# Patient Record
Sex: Female | Born: 1945 | Race: White | Hispanic: No | Marital: Married | Smoking: Never smoker
Health system: Southern US, Community
[De-identification: ages and names within clinical notes are randomized; demographics above are authoritative.]

## PROBLEM LIST (undated history)

## (undated) DIAGNOSIS — I10 Essential (primary) hypertension: Secondary | ICD-10-CM

## (undated) DIAGNOSIS — M797 Fibromyalgia: Secondary | ICD-10-CM

## (undated) DIAGNOSIS — K8689 Other specified diseases of pancreas: Principal | ICD-10-CM

## (undated) DIAGNOSIS — J984 Other disorders of lung: Secondary | ICD-10-CM

## (undated) DIAGNOSIS — N179 Acute kidney failure, unspecified: Secondary | ICD-10-CM

## (undated) DIAGNOSIS — N289 Disorder of kidney and ureter, unspecified: Secondary | ICD-10-CM

## (undated) DIAGNOSIS — E7800 Pure hypercholesterolemia, unspecified: Secondary | ICD-10-CM

## (undated) DIAGNOSIS — J189 Pneumonia, unspecified organism: Secondary | ICD-10-CM

## (undated) DIAGNOSIS — I5032 Chronic diastolic (congestive) heart failure: Secondary | ICD-10-CM

## (undated) DIAGNOSIS — I209 Angina pectoris, unspecified: Secondary | ICD-10-CM

## (undated) DIAGNOSIS — K219 Gastro-esophageal reflux disease without esophagitis: Secondary | ICD-10-CM

## (undated) DIAGNOSIS — I272 Pulmonary hypertension, unspecified: Secondary | ICD-10-CM

## (undated) DIAGNOSIS — I779 Disorder of arteries and arterioles, unspecified: Secondary | ICD-10-CM

## (undated) DIAGNOSIS — I739 Peripheral vascular disease, unspecified: Secondary | ICD-10-CM

## (undated) DIAGNOSIS — J811 Chronic pulmonary edema: Secondary | ICD-10-CM

## (undated) DIAGNOSIS — I2510 Atherosclerotic heart disease of native coronary artery without angina pectoris: Secondary | ICD-10-CM

## (undated) DIAGNOSIS — E039 Hypothyroidism, unspecified: Secondary | ICD-10-CM

## (undated) DIAGNOSIS — R112 Nausea with vomiting, unspecified: Secondary | ICD-10-CM

## (undated) DIAGNOSIS — R011 Cardiac murmur, unspecified: Secondary | ICD-10-CM

## (undated) DIAGNOSIS — Z9981 Dependence on supplemental oxygen: Secondary | ICD-10-CM

## (undated) DIAGNOSIS — F419 Anxiety disorder, unspecified: Secondary | ICD-10-CM

## (undated) DIAGNOSIS — J15212 Pneumonia due to Methicillin resistant Staphylococcus aureus: Secondary | ICD-10-CM

## (undated) DIAGNOSIS — Z8719 Personal history of other diseases of the digestive system: Secondary | ICD-10-CM

## (undated) DIAGNOSIS — D649 Anemia, unspecified: Secondary | ICD-10-CM

## (undated) DIAGNOSIS — Z9889 Other specified postprocedural states: Secondary | ICD-10-CM

## (undated) DIAGNOSIS — I509 Heart failure, unspecified: Secondary | ICD-10-CM

## (undated) DIAGNOSIS — E119 Type 2 diabetes mellitus without complications: Secondary | ICD-10-CM

## (undated) HISTORY — DX: Pulmonary hypertension, unspecified: I27.20

## (undated) HISTORY — DX: Atherosclerotic heart disease of native coronary artery without angina pectoris: I25.10

## (undated) HISTORY — DX: Pneumonia due to methicillin resistant Staphylococcus aureus: J15.212

## (undated) HISTORY — DX: Chronic pulmonary edema: J81.1

## (undated) HISTORY — DX: Essential (primary) hypertension: I10

## (undated) HISTORY — DX: Fibromyalgia: M79.7

## (undated) HISTORY — DX: Chronic diastolic (congestive) heart failure: I50.32

## (undated) HISTORY — PX: TUBAL LIGATION: SHX77

## (undated) HISTORY — DX: Anemia, unspecified: D64.9

## (undated) HISTORY — DX: Other disorders of lung: J98.4

## (undated) HISTORY — DX: Disorder of kidney and ureter, unspecified: N28.9

## (undated) HISTORY — DX: Peripheral vascular disease, unspecified: I73.9

## (undated) HISTORY — DX: Acute kidney failure, unspecified: N17.9

## (undated) HISTORY — DX: Other specified diseases of pancreas: K86.89

## (undated) HISTORY — PX: CORONARY ANGIOPLASTY WITH STENT PLACEMENT: SHX49

## (undated) HISTORY — DX: Disorder of arteries and arterioles, unspecified: I77.9

## (undated) HISTORY — PX: DILATION AND CURETTAGE OF UTERUS: SHX78

## (undated) HISTORY — PX: ABDOMINAL HYSTERECTOMY: SHX81

## (undated) HISTORY — DX: Pure hypercholesterolemia, unspecified: E78.00

---

## 1991-10-23 HISTORY — PX: CORONARY ANGIOPLASTY: SHX604

## 1995-10-23 HISTORY — PX: CORONARY ARTERY BYPASS GRAFT: SHX141

## 1999-02-09 ENCOUNTER — Inpatient Hospital Stay (HOSPITAL_COMMUNITY): Admission: EM | Admit: 1999-02-09 | Discharge: 1999-02-14 | Payer: Self-pay | Admitting: Emergency Medicine

## 1999-02-09 ENCOUNTER — Encounter: Payer: Self-pay | Admitting: Internal Medicine

## 1999-02-11 ENCOUNTER — Encounter: Payer: Self-pay | Admitting: Cardiology

## 1999-02-14 ENCOUNTER — Encounter: Payer: Self-pay | Admitting: Cardiology

## 2002-02-16 ENCOUNTER — Encounter: Payer: Self-pay | Admitting: Family Medicine

## 2002-02-16 ENCOUNTER — Inpatient Hospital Stay (HOSPITAL_COMMUNITY): Admission: AD | Admit: 2002-02-16 | Discharge: 2002-02-18 | Payer: Self-pay | Admitting: Family Medicine

## 2002-02-16 ENCOUNTER — Ambulatory Visit (HOSPITAL_COMMUNITY): Admission: RE | Admit: 2002-02-16 | Discharge: 2002-02-16 | Payer: Self-pay | Admitting: Family Medicine

## 2002-02-18 ENCOUNTER — Encounter: Payer: Self-pay | Admitting: Family Medicine

## 2002-03-02 ENCOUNTER — Encounter: Payer: Self-pay | Admitting: Family Medicine

## 2002-03-02 ENCOUNTER — Ambulatory Visit (HOSPITAL_COMMUNITY): Admission: RE | Admit: 2002-03-02 | Discharge: 2002-03-02 | Payer: Self-pay | Admitting: Family Medicine

## 2002-03-03 ENCOUNTER — Ambulatory Visit (HOSPITAL_COMMUNITY): Admission: RE | Admit: 2002-03-03 | Discharge: 2002-03-03 | Payer: Self-pay | Admitting: Pulmonary Disease

## 2002-03-13 ENCOUNTER — Encounter: Payer: Self-pay | Admitting: Family Medicine

## 2002-03-13 ENCOUNTER — Ambulatory Visit (HOSPITAL_COMMUNITY): Admission: RE | Admit: 2002-03-13 | Discharge: 2002-03-13 | Payer: Self-pay | Admitting: Family Medicine

## 2002-04-10 ENCOUNTER — Encounter: Payer: Self-pay | Admitting: Critical Care Medicine

## 2002-04-10 ENCOUNTER — Ambulatory Visit (HOSPITAL_COMMUNITY): Admission: RE | Admit: 2002-04-10 | Discharge: 2002-04-10 | Payer: Self-pay | Admitting: Critical Care Medicine

## 2002-05-06 ENCOUNTER — Ambulatory Visit (HOSPITAL_COMMUNITY): Admission: RE | Admit: 2002-05-06 | Discharge: 2002-05-06 | Payer: Self-pay | Admitting: Gastroenterology

## 2002-06-19 ENCOUNTER — Encounter: Admission: RE | Admit: 2002-06-19 | Discharge: 2002-06-19 | Payer: Self-pay | Admitting: Internal Medicine

## 2002-06-19 ENCOUNTER — Encounter: Payer: Self-pay | Admitting: Internal Medicine

## 2002-07-03 ENCOUNTER — Encounter: Payer: Self-pay | Admitting: Internal Medicine

## 2002-07-03 ENCOUNTER — Encounter: Admission: RE | Admit: 2002-07-03 | Discharge: 2002-07-03 | Payer: Self-pay | Admitting: Internal Medicine

## 2003-03-11 ENCOUNTER — Ambulatory Visit (HOSPITAL_COMMUNITY): Admission: RE | Admit: 2003-03-11 | Discharge: 2003-03-11 | Payer: Self-pay | Admitting: Internal Medicine

## 2003-03-11 ENCOUNTER — Encounter: Payer: Self-pay | Admitting: Internal Medicine

## 2003-04-12 ENCOUNTER — Observation Stay (HOSPITAL_COMMUNITY): Admission: EM | Admit: 2003-04-12 | Discharge: 2003-04-13 | Payer: Self-pay | Admitting: Emergency Medicine

## 2003-04-12 ENCOUNTER — Encounter: Payer: Self-pay | Admitting: Emergency Medicine

## 2004-01-13 ENCOUNTER — Ambulatory Visit (HOSPITAL_COMMUNITY): Admission: RE | Admit: 2004-01-13 | Discharge: 2004-01-13 | Payer: Self-pay | Admitting: Internal Medicine

## 2005-01-29 ENCOUNTER — Ambulatory Visit (HOSPITAL_COMMUNITY): Admission: RE | Admit: 2005-01-29 | Discharge: 2005-01-29 | Payer: Self-pay | Admitting: Internal Medicine

## 2005-04-19 ENCOUNTER — Ambulatory Visit: Payer: Self-pay | Admitting: Cardiology

## 2005-04-26 ENCOUNTER — Ambulatory Visit: Payer: Self-pay | Admitting: Cardiology

## 2005-05-11 ENCOUNTER — Ambulatory Visit: Payer: Self-pay | Admitting: Cardiology

## 2005-09-25 ENCOUNTER — Ambulatory Visit: Payer: Self-pay | Admitting: Cardiology

## 2005-09-26 ENCOUNTER — Ambulatory Visit: Payer: Self-pay | Admitting: Cardiology

## 2005-09-28 ENCOUNTER — Inpatient Hospital Stay (HOSPITAL_BASED_OUTPATIENT_CLINIC_OR_DEPARTMENT_OTHER): Admission: RE | Admit: 2005-09-28 | Discharge: 2005-09-28 | Payer: Self-pay | Admitting: Cardiology

## 2005-09-28 ENCOUNTER — Ambulatory Visit: Payer: Self-pay | Admitting: Cardiology

## 2005-10-05 ENCOUNTER — Ambulatory Visit (HOSPITAL_COMMUNITY): Admission: RE | Admit: 2005-10-05 | Discharge: 2005-10-06 | Payer: Self-pay | Admitting: Cardiology

## 2005-10-31 ENCOUNTER — Ambulatory Visit: Payer: Self-pay | Admitting: *Deleted

## 2006-02-25 ENCOUNTER — Ambulatory Visit: Payer: Self-pay | Admitting: Cardiology

## 2006-03-12 ENCOUNTER — Ambulatory Visit: Payer: Self-pay

## 2006-03-19 ENCOUNTER — Ambulatory Visit: Payer: Self-pay | Admitting: Cardiology

## 2006-03-25 ENCOUNTER — Ambulatory Visit: Payer: Self-pay

## 2006-04-15 ENCOUNTER — Ambulatory Visit: Payer: Self-pay

## 2006-04-23 ENCOUNTER — Emergency Department (HOSPITAL_COMMUNITY): Admission: EM | Admit: 2006-04-23 | Discharge: 2006-04-23 | Payer: Self-pay | Admitting: Emergency Medicine

## 2006-05-02 ENCOUNTER — Ambulatory Visit: Payer: Self-pay | Admitting: Cardiology

## 2006-05-27 ENCOUNTER — Ambulatory Visit: Payer: Self-pay

## 2006-07-12 ENCOUNTER — Ambulatory Visit: Payer: Self-pay | Admitting: Cardiology

## 2006-10-24 ENCOUNTER — Ambulatory Visit: Payer: Self-pay | Admitting: Cardiology

## 2006-10-25 ENCOUNTER — Ambulatory Visit: Payer: Self-pay | Admitting: Cardiology

## 2006-10-29 ENCOUNTER — Ambulatory Visit (HOSPITAL_COMMUNITY): Admission: RE | Admit: 2006-10-29 | Discharge: 2006-10-29 | Payer: Self-pay | Admitting: Cardiology

## 2007-01-09 ENCOUNTER — Inpatient Hospital Stay (HOSPITAL_COMMUNITY): Admission: AD | Admit: 2007-01-09 | Discharge: 2007-01-15 | Payer: Self-pay | Admitting: Internal Medicine

## 2007-03-10 ENCOUNTER — Ambulatory Visit (HOSPITAL_COMMUNITY): Admission: RE | Admit: 2007-03-10 | Discharge: 2007-03-10 | Payer: Self-pay | Admitting: Family Medicine

## 2007-03-25 ENCOUNTER — Ambulatory Visit: Payer: Self-pay | Admitting: Cardiology

## 2007-04-22 ENCOUNTER — Ambulatory Visit (HOSPITAL_COMMUNITY): Admission: RE | Admit: 2007-04-22 | Discharge: 2007-04-22 | Payer: Self-pay | Admitting: Internal Medicine

## 2007-09-04 ENCOUNTER — Ambulatory Visit (HOSPITAL_COMMUNITY): Admission: RE | Admit: 2007-09-04 | Discharge: 2007-09-04 | Payer: Self-pay | Admitting: Family Medicine

## 2007-09-30 ENCOUNTER — Ambulatory Visit: Payer: Self-pay | Admitting: Cardiology

## 2007-10-13 ENCOUNTER — Encounter: Payer: Self-pay | Admitting: Cardiology

## 2007-10-13 ENCOUNTER — Ambulatory Visit: Payer: Self-pay | Admitting: Cardiology

## 2007-10-13 ENCOUNTER — Ambulatory Visit: Payer: Self-pay

## 2007-10-13 LAB — CONVERTED CEMR LAB
Basophils Absolute: 0 10*3/uL (ref 0.0–0.1)
Basophils Relative: 0.1 % (ref 0.0–1.0)
CO2: 32 meq/L (ref 19–32)
Chloride: 99 meq/L (ref 96–112)
Creatinine, Ser: 0.8 mg/dL (ref 0.4–1.2)
HCT: 34 % — ABNORMAL LOW (ref 36.0–46.0)
Hemoglobin: 11.8 g/dL — ABNORMAL LOW (ref 12.0–15.0)
Monocytes Absolute: 0.8 10*3/uL — ABNORMAL HIGH (ref 0.2–0.7)
Neutrophils Relative %: 73.9 % (ref 43.0–77.0)
Potassium: 4.2 meq/L (ref 3.5–5.1)
Pro B Natriuretic peptide (BNP): 429 pg/mL — ABNORMAL HIGH (ref 0.0–100.0)
RBC: 3.75 M/uL — ABNORMAL LOW (ref 3.87–5.11)
RDW: 12.7 % (ref 11.5–14.6)
Sodium: 139 meq/L (ref 135–145)
TSH: 2.07 microintl units/mL (ref 0.35–5.50)
WBC: 7.8 10*3/uL (ref 4.5–10.5)

## 2008-06-03 ENCOUNTER — Ambulatory Visit (HOSPITAL_COMMUNITY): Admission: RE | Admit: 2008-06-03 | Discharge: 2008-06-03 | Payer: Self-pay | Admitting: Family Medicine

## 2008-07-23 ENCOUNTER — Ambulatory Visit: Payer: Self-pay | Admitting: Cardiology

## 2008-07-23 LAB — CONVERTED CEMR LAB
BUN: 48 mg/dL — ABNORMAL HIGH (ref 6–23)
Basophils Relative: 0.2 % (ref 0.0–3.0)
CO2: 30 meq/L (ref 19–32)
Calcium: 8.8 mg/dL (ref 8.4–10.5)
Creatinine, Ser: 1.2 mg/dL (ref 0.4–1.2)
GFR calc Af Amer: 58 mL/min
Glucose, Bld: 92 mg/dL (ref 70–99)
HCT: 37.7 % (ref 36.0–46.0)
Hemoglobin: 12.9 g/dL (ref 12.0–15.0)
Lymphocytes Relative: 16.4 % (ref 12.0–46.0)
MCHC: 34.4 g/dL (ref 30.0–36.0)
Monocytes Absolute: 0.9 10*3/uL (ref 0.1–1.0)
Monocytes Relative: 9.9 % (ref 3.0–12.0)
Neutro Abs: 6.4 10*3/uL (ref 1.4–7.7)
RBC: 4.08 M/uL (ref 3.87–5.11)
RDW: 13.1 % (ref 11.5–14.6)

## 2008-08-02 ENCOUNTER — Encounter: Payer: Self-pay | Admitting: Cardiology

## 2008-08-02 ENCOUNTER — Ambulatory Visit: Payer: Self-pay

## 2008-09-04 ENCOUNTER — Encounter
Admission: RE | Admit: 2008-09-04 | Discharge: 2008-09-04 | Payer: Self-pay | Admitting: Physical Medicine and Rehabilitation

## 2008-09-27 ENCOUNTER — Emergency Department (HOSPITAL_COMMUNITY): Admission: EM | Admit: 2008-09-27 | Discharge: 2008-09-27 | Payer: Self-pay | Admitting: Emergency Medicine

## 2008-11-19 ENCOUNTER — Emergency Department (HOSPITAL_COMMUNITY): Admission: EM | Admit: 2008-11-19 | Discharge: 2008-11-19 | Payer: Self-pay | Admitting: Emergency Medicine

## 2008-12-13 ENCOUNTER — Ambulatory Visit (HOSPITAL_COMMUNITY): Admission: RE | Admit: 2008-12-13 | Discharge: 2008-12-13 | Payer: Self-pay | Admitting: Family Medicine

## 2009-02-10 ENCOUNTER — Encounter: Payer: Self-pay | Admitting: Cardiology

## 2009-03-13 ENCOUNTER — Encounter: Payer: Self-pay | Admitting: Cardiology

## 2009-06-29 ENCOUNTER — Emergency Department (HOSPITAL_COMMUNITY): Admission: EM | Admit: 2009-06-29 | Discharge: 2009-06-29 | Payer: Self-pay | Admitting: Emergency Medicine

## 2009-06-29 ENCOUNTER — Telehealth: Payer: Self-pay | Admitting: Cardiology

## 2009-07-18 DIAGNOSIS — I5032 Chronic diastolic (congestive) heart failure: Secondary | ICD-10-CM

## 2009-07-18 DIAGNOSIS — E785 Hyperlipidemia, unspecified: Secondary | ICD-10-CM

## 2009-07-19 ENCOUNTER — Ambulatory Visit: Payer: Self-pay | Admitting: Cardiology

## 2009-07-19 ENCOUNTER — Encounter (INDEPENDENT_AMBULATORY_CARE_PROVIDER_SITE_OTHER): Payer: Self-pay | Admitting: *Deleted

## 2009-07-21 ENCOUNTER — Inpatient Hospital Stay (HOSPITAL_BASED_OUTPATIENT_CLINIC_OR_DEPARTMENT_OTHER): Admission: RE | Admit: 2009-07-21 | Discharge: 2009-07-21 | Payer: Self-pay | Admitting: Cardiology

## 2009-07-21 ENCOUNTER — Ambulatory Visit: Payer: Self-pay | Admitting: Cardiology

## 2009-07-25 ENCOUNTER — Ambulatory Visit: Payer: Self-pay | Admitting: Cardiology

## 2009-07-25 ENCOUNTER — Encounter: Payer: Self-pay | Admitting: Cardiology

## 2009-07-25 ENCOUNTER — Ambulatory Visit: Payer: Self-pay | Admitting: Cardiovascular Disease

## 2009-07-25 ENCOUNTER — Ambulatory Visit: Payer: Self-pay | Admitting: Internal Medicine

## 2009-07-25 ENCOUNTER — Ambulatory Visit (HOSPITAL_COMMUNITY): Admission: RE | Admit: 2009-07-25 | Discharge: 2009-07-25 | Payer: Self-pay | Admitting: Cardiology

## 2009-07-25 ENCOUNTER — Ambulatory Visit: Payer: Self-pay

## 2009-07-25 DIAGNOSIS — I2721 Secondary pulmonary arterial hypertension: Secondary | ICD-10-CM | POA: Insufficient documentation

## 2009-07-25 DIAGNOSIS — N259 Disorder resulting from impaired renal tubular function, unspecified: Secondary | ICD-10-CM | POA: Insufficient documentation

## 2009-07-26 ENCOUNTER — Ambulatory Visit: Payer: Self-pay | Admitting: Cardiology

## 2009-07-26 LAB — CONVERTED CEMR LAB
CO2: 35 meq/L — ABNORMAL HIGH (ref 19–32)
Calcium: 9.4 mg/dL (ref 8.4–10.5)
Creatinine, Ser: 2 mg/dL — ABNORMAL HIGH (ref 0.4–1.2)
GFR calc non Af Amer: 26.61 mL/min (ref >60)

## 2009-07-29 ENCOUNTER — Ambulatory Visit: Payer: Self-pay | Admitting: Emergency Medicine

## 2009-07-29 ENCOUNTER — Ambulatory Visit: Payer: Self-pay | Admitting: Cardiology

## 2009-07-29 DIAGNOSIS — I5033 Acute on chronic diastolic (congestive) heart failure: Secondary | ICD-10-CM

## 2009-08-01 ENCOUNTER — Encounter: Payer: Self-pay | Admitting: Cardiology

## 2009-08-02 LAB — CONVERTED CEMR LAB
BUN: 82 mg/dL — ABNORMAL HIGH (ref 6–23)
Creatinine, Ser: 2.4 mg/dL — ABNORMAL HIGH (ref 0.4–1.2)
GFR calc non Af Amer: 21.56 mL/min (ref >60)
Potassium: 4.3 meq/L (ref 3.5–5.1)

## 2009-08-04 ENCOUNTER — Ambulatory Visit: Payer: Self-pay | Admitting: Cardiology

## 2009-08-07 LAB — CONVERTED CEMR LAB
CO2: 35 meq/L — ABNORMAL HIGH (ref 19–32)
Calcium: 8.8 mg/dL (ref 8.4–10.5)
GFR calc non Af Amer: 20.57 mL/min (ref >60)
Potassium: 3.4 meq/L — ABNORMAL LOW (ref 3.5–5.1)
Sodium: 129 meq/L — ABNORMAL LOW (ref 135–145)

## 2009-08-08 ENCOUNTER — Ambulatory Visit: Payer: Self-pay | Admitting: Cardiology

## 2009-08-11 LAB — CONVERTED CEMR LAB
Chloride: 95 meq/L — ABNORMAL LOW (ref 96–112)
Potassium: 3.6 meq/L (ref 3.5–5.1)
Pro B Natriuretic peptide (BNP): 244 pg/mL — ABNORMAL HIGH (ref 0.0–100.0)

## 2009-08-16 ENCOUNTER — Ambulatory Visit: Payer: Self-pay | Admitting: Cardiology

## 2009-08-17 ENCOUNTER — Encounter: Payer: Self-pay | Admitting: Cardiology

## 2009-08-22 ENCOUNTER — Telehealth: Payer: Self-pay | Admitting: Cardiology

## 2009-08-22 LAB — CONVERTED CEMR LAB
CO2: 27 meq/L (ref 19–32)
Chloride: 102 meq/L (ref 96–112)
Sodium: 140 meq/L (ref 135–145)

## 2009-08-23 ENCOUNTER — Ambulatory Visit: Payer: Self-pay | Admitting: Emergency Medicine

## 2009-09-01 ENCOUNTER — Telehealth: Payer: Self-pay | Admitting: Emergency Medicine

## 2009-09-06 ENCOUNTER — Encounter: Payer: Self-pay | Admitting: Emergency Medicine

## 2009-09-06 ENCOUNTER — Ambulatory Visit: Admission: RE | Admit: 2009-09-06 | Discharge: 2009-09-06 | Payer: Self-pay | Admitting: Emergency Medicine

## 2009-09-07 ENCOUNTER — Encounter: Payer: Self-pay | Admitting: Cardiology

## 2009-09-12 ENCOUNTER — Ambulatory Visit: Payer: Self-pay | Admitting: Pulmonary Disease

## 2009-09-13 ENCOUNTER — Ambulatory Visit: Payer: Self-pay | Admitting: Cardiology

## 2009-09-16 ENCOUNTER — Telehealth (INDEPENDENT_AMBULATORY_CARE_PROVIDER_SITE_OTHER): Payer: Self-pay | Admitting: *Deleted

## 2009-09-22 ENCOUNTER — Telehealth: Payer: Self-pay | Admitting: Cardiology

## 2009-09-22 LAB — CONVERTED CEMR LAB
BUN: 25 mg/dL — ABNORMAL HIGH (ref 6–23)
CO2: 32 meq/L (ref 19–32)
Chloride: 101 meq/L (ref 96–112)
Glucose, Bld: 143 mg/dL — ABNORMAL HIGH (ref 70–99)
Potassium: 4.3 meq/L (ref 3.5–5.1)
Pro B Natriuretic peptide (BNP): 101 pg/mL — ABNORMAL HIGH (ref 0.0–100.0)

## 2009-09-23 ENCOUNTER — Encounter: Payer: Self-pay | Admitting: Emergency Medicine

## 2009-09-26 ENCOUNTER — Telehealth: Payer: Self-pay | Admitting: Cardiology

## 2009-10-12 ENCOUNTER — Ambulatory Visit: Payer: Self-pay | Admitting: Emergency Medicine

## 2009-10-12 ENCOUNTER — Ambulatory Visit: Payer: Self-pay | Admitting: Cardiology

## 2009-10-18 ENCOUNTER — Telehealth: Payer: Self-pay | Admitting: Cardiology

## 2009-10-18 LAB — CONVERTED CEMR LAB
BUN: 27 mg/dL — ABNORMAL HIGH (ref 6–23)
CO2: 34 meq/L — ABNORMAL HIGH (ref 19–32)
Chloride: 100 meq/L (ref 96–112)
Creatinine, Ser: 1.3 mg/dL — ABNORMAL HIGH (ref 0.4–1.2)

## 2009-10-20 ENCOUNTER — Encounter: Payer: Self-pay | Admitting: Cardiology

## 2009-10-20 ENCOUNTER — Telehealth: Payer: Self-pay | Admitting: Cardiology

## 2009-10-24 ENCOUNTER — Encounter: Payer: Self-pay | Admitting: Cardiology

## 2009-10-24 ENCOUNTER — Telehealth: Payer: Self-pay | Admitting: Cardiology

## 2009-10-31 ENCOUNTER — Encounter: Payer: Self-pay | Admitting: Cardiology

## 2009-10-31 LAB — CONVERTED CEMR LAB
CO2: 28 meq/L (ref 19–32)
Calcium: 9 mg/dL (ref 8.4–10.5)
Glucose, Bld: 108 mg/dL — ABNORMAL HIGH (ref 70–99)
Sodium: 144 meq/L (ref 135–145)

## 2009-11-01 ENCOUNTER — Encounter: Payer: Self-pay | Admitting: Cardiology

## 2009-11-02 ENCOUNTER — Ambulatory Visit: Payer: Self-pay | Admitting: Internal Medicine

## 2009-11-11 ENCOUNTER — Encounter: Payer: Self-pay | Admitting: Cardiology

## 2009-11-12 ENCOUNTER — Encounter: Payer: Self-pay | Admitting: Cardiology

## 2009-11-16 ENCOUNTER — Ambulatory Visit: Payer: Self-pay | Admitting: Cardiology

## 2009-11-17 LAB — CONVERTED CEMR LAB
CO2: 33 meq/L — ABNORMAL HIGH (ref 19–32)
Calcium: 9.2 mg/dL (ref 8.4–10.5)
Creatinine, Ser: 2.3 mg/dL — ABNORMAL HIGH (ref 0.4–1.2)

## 2009-11-21 ENCOUNTER — Ambulatory Visit: Payer: Self-pay | Admitting: Cardiology

## 2009-11-21 ENCOUNTER — Ambulatory Visit: Payer: Self-pay | Admitting: Emergency Medicine

## 2009-11-25 ENCOUNTER — Encounter: Payer: Self-pay | Admitting: Cardiology

## 2009-11-28 LAB — CONVERTED CEMR LAB
BUN: 57 mg/dL — ABNORMAL HIGH (ref 6–23)
Calcium: 9.1 mg/dL (ref 8.4–10.5)
Creatinine, Ser: 1.7 mg/dL — ABNORMAL HIGH (ref 0.4–1.2)
GFR calc non Af Amer: 32.07 mL/min (ref >60)
Glucose, Bld: 114 mg/dL — ABNORMAL HIGH (ref 70–99)
Sodium: 143 meq/L (ref 135–145)

## 2009-11-29 ENCOUNTER — Encounter: Payer: Self-pay | Admitting: Cardiology

## 2009-11-29 ENCOUNTER — Telehealth: Payer: Self-pay | Admitting: Cardiology

## 2009-11-29 ENCOUNTER — Ambulatory Visit: Payer: Self-pay | Admitting: Cardiology

## 2009-11-29 DIAGNOSIS — R0602 Shortness of breath: Secondary | ICD-10-CM

## 2009-11-30 ENCOUNTER — Telehealth: Payer: Self-pay | Admitting: Cardiology

## 2009-11-30 LAB — CONVERTED CEMR LAB
Calcium: 9.3 mg/dL (ref 8.4–10.5)
Chloride: 97 meq/L (ref 96–112)
Creatinine, Ser: 1.9 mg/dL — ABNORMAL HIGH (ref 0.4–1.2)
GFR calc non Af Amer: 28.2 mL/min (ref >60)
Pro B Natriuretic peptide (BNP): 318 pg/mL — ABNORMAL HIGH (ref 0.0–100.0)

## 2009-12-01 ENCOUNTER — Emergency Department (HOSPITAL_COMMUNITY): Admission: EM | Admit: 2009-12-01 | Discharge: 2009-12-01 | Payer: Self-pay | Admitting: Emergency Medicine

## 2009-12-02 ENCOUNTER — Encounter: Payer: Self-pay | Admitting: Cardiology

## 2009-12-04 ENCOUNTER — Inpatient Hospital Stay (HOSPITAL_COMMUNITY): Admission: EM | Admit: 2009-12-04 | Discharge: 2009-12-12 | Payer: Self-pay | Admitting: Emergency Medicine

## 2009-12-04 ENCOUNTER — Encounter: Payer: Self-pay | Admitting: Cardiology

## 2009-12-04 ENCOUNTER — Ambulatory Visit: Payer: Self-pay | Admitting: Cardiovascular Disease

## 2009-12-04 ENCOUNTER — Telehealth (INDEPENDENT_AMBULATORY_CARE_PROVIDER_SITE_OTHER): Payer: Self-pay | Admitting: Physician Assistant

## 2009-12-05 ENCOUNTER — Encounter: Payer: Self-pay | Admitting: Cardiovascular Disease

## 2009-12-05 LAB — CONVERTED CEMR LAB
Calcium: 9 mg/dL (ref 8.4–10.5)
Sodium: 142 meq/L (ref 135–145)

## 2009-12-08 ENCOUNTER — Telehealth: Payer: Self-pay | Admitting: Cardiology

## 2009-12-09 ENCOUNTER — Encounter: Payer: Self-pay | Admitting: Emergency Medicine

## 2009-12-09 ENCOUNTER — Encounter (INDEPENDENT_AMBULATORY_CARE_PROVIDER_SITE_OTHER): Payer: Self-pay | Admitting: *Deleted

## 2009-12-10 ENCOUNTER — Ambulatory Visit: Payer: Self-pay | Admitting: Emergency Medicine

## 2009-12-14 ENCOUNTER — Ambulatory Visit: Payer: Self-pay | Admitting: Cardiology

## 2009-12-14 ENCOUNTER — Telehealth: Payer: Self-pay | Admitting: Cardiology

## 2009-12-14 ENCOUNTER — Telehealth: Payer: Self-pay | Admitting: Emergency Medicine

## 2009-12-15 ENCOUNTER — Telehealth: Payer: Self-pay | Admitting: Cardiology

## 2009-12-16 ENCOUNTER — Encounter: Payer: Self-pay | Admitting: Cardiology

## 2009-12-16 LAB — CONVERTED CEMR LAB
CO2: 35 meq/L — ABNORMAL HIGH (ref 19–32)
Chloride: 99 meq/L (ref 96–112)
Potassium: 5.3 meq/L — ABNORMAL HIGH (ref 3.5–5.1)
Sodium: 140 meq/L (ref 135–145)

## 2009-12-17 ENCOUNTER — Encounter: Payer: Self-pay | Admitting: Cardiology

## 2009-12-20 ENCOUNTER — Ambulatory Visit: Payer: Self-pay | Admitting: Emergency Medicine

## 2009-12-21 ENCOUNTER — Ambulatory Visit: Payer: Self-pay | Admitting: Cardiology

## 2009-12-21 ENCOUNTER — Encounter: Payer: Self-pay | Admitting: Physician Assistant

## 2009-12-21 DIAGNOSIS — R079 Chest pain, unspecified: Secondary | ICD-10-CM | POA: Insufficient documentation

## 2009-12-23 ENCOUNTER — Telehealth: Payer: Self-pay | Admitting: Cardiology

## 2009-12-23 LAB — CONVERTED CEMR LAB
BUN: 55 mg/dL — ABNORMAL HIGH (ref 6–23)
Chloride: 96 meq/L (ref 96–112)
Glucose, Bld: 222 mg/dL — ABNORMAL HIGH (ref 70–99)
Potassium: 3.9 meq/L (ref 3.5–5.1)
Pro B Natriuretic peptide (BNP): 649 pg/mL — ABNORMAL HIGH (ref 0.0–100.0)
Sodium: 140 meq/L (ref 135–145)

## 2009-12-26 ENCOUNTER — Encounter: Payer: Self-pay | Admitting: Physician Assistant

## 2009-12-26 ENCOUNTER — Encounter: Payer: Self-pay | Admitting: Cardiology

## 2009-12-26 ENCOUNTER — Telehealth: Payer: Self-pay | Admitting: Cardiology

## 2009-12-28 ENCOUNTER — Encounter: Payer: Self-pay | Admitting: Physician Assistant

## 2009-12-28 DIAGNOSIS — R55 Syncope and collapse: Secondary | ICD-10-CM

## 2010-01-04 LAB — CONVERTED CEMR LAB
BUN: 43 mg/dL — ABNORMAL HIGH (ref 6–23)
CO2: 39 meq/L — ABNORMAL HIGH (ref 19–32)
Chloride: 101 meq/L (ref 96–112)
Creatinine, Ser: 1.4 mg/dL — ABNORMAL HIGH (ref 0.4–1.2)
Potassium: 4.6 meq/L (ref 3.5–5.1)

## 2010-01-13 ENCOUNTER — Ambulatory Visit: Payer: Self-pay | Admitting: Cardiology

## 2010-01-20 LAB — CONVERTED CEMR LAB
CO2: 33 meq/L — ABNORMAL HIGH (ref 19–32)
Calcium: 9.1 mg/dL (ref 8.4–10.5)
Chloride: 97 meq/L (ref 96–112)
Glucose, Bld: 201 mg/dL — ABNORMAL HIGH (ref 70–99)
Sodium: 137 meq/L (ref 135–145)

## 2010-02-07 ENCOUNTER — Telehealth: Payer: Self-pay | Admitting: Cardiology

## 2010-02-16 ENCOUNTER — Ambulatory Visit: Payer: Self-pay | Admitting: Emergency Medicine

## 2010-02-20 ENCOUNTER — Ambulatory Visit: Payer: Self-pay | Admitting: Cardiology

## 2010-02-22 ENCOUNTER — Telehealth: Payer: Self-pay | Admitting: Cardiology

## 2010-03-01 ENCOUNTER — Encounter: Payer: Self-pay | Admitting: Cardiology

## 2010-03-02 ENCOUNTER — Telehealth: Payer: Self-pay | Admitting: Cardiology

## 2010-03-07 ENCOUNTER — Encounter: Payer: Self-pay | Admitting: Cardiology

## 2010-03-21 ENCOUNTER — Ambulatory Visit: Payer: Self-pay | Admitting: Emergency Medicine

## 2010-03-30 ENCOUNTER — Encounter: Payer: Self-pay | Admitting: Emergency Medicine

## 2010-03-30 ENCOUNTER — Ambulatory Visit: Payer: Self-pay | Admitting: Cardiology

## 2010-03-30 ENCOUNTER — Telehealth: Payer: Self-pay | Admitting: Cardiology

## 2010-04-03 ENCOUNTER — Telehealth (INDEPENDENT_AMBULATORY_CARE_PROVIDER_SITE_OTHER): Payer: Self-pay | Admitting: *Deleted

## 2010-04-13 ENCOUNTER — Telehealth: Payer: Self-pay | Admitting: Cardiology

## 2010-04-17 ENCOUNTER — Encounter: Payer: Self-pay | Admitting: Cardiology

## 2010-04-18 ENCOUNTER — Telehealth: Payer: Self-pay | Admitting: Cardiology

## 2010-04-18 LAB — CONVERTED CEMR LAB
BUN: 33 mg/dL — ABNORMAL HIGH (ref 6–23)
Chloride: 96 meq/L (ref 96–112)
Creatinine, Ser: 1.29 mg/dL — ABNORMAL HIGH (ref 0.40–1.20)
Glucose, Bld: 125 mg/dL — ABNORMAL HIGH (ref 70–99)

## 2010-04-21 ENCOUNTER — Telehealth: Payer: Self-pay | Admitting: Cardiology

## 2010-04-25 ENCOUNTER — Encounter: Payer: Self-pay | Admitting: Cardiology

## 2010-04-25 ENCOUNTER — Telehealth: Payer: Self-pay | Admitting: Cardiology

## 2010-04-25 LAB — CONVERTED CEMR LAB
BUN: 47 mg/dL — ABNORMAL HIGH (ref 6–23)
Calcium: 8.9 mg/dL (ref 8.4–10.5)
Creatinine, Ser: 1.66 mg/dL — ABNORMAL HIGH (ref 0.40–1.20)
Glucose, Bld: 262 mg/dL — ABNORMAL HIGH (ref 70–99)
Potassium: 4.3 meq/L (ref 3.5–5.3)

## 2010-04-28 ENCOUNTER — Telehealth: Payer: Self-pay | Admitting: Cardiology

## 2010-04-28 ENCOUNTER — Encounter: Payer: Self-pay | Admitting: Cardiology

## 2010-05-01 ENCOUNTER — Ambulatory Visit: Payer: Self-pay | Admitting: Cardiology

## 2010-05-02 LAB — CONVERTED CEMR LAB
Calcium: 9.2 mg/dL (ref 8.4–10.5)
Glucose, Bld: 75 mg/dL (ref 70–99)
Potassium: 3.7 meq/L (ref 3.5–5.3)
Sodium: 144 meq/L (ref 135–145)

## 2010-05-04 ENCOUNTER — Telehealth: Payer: Self-pay | Admitting: Cardiology

## 2010-05-05 ENCOUNTER — Encounter: Payer: Self-pay | Admitting: Cardiology

## 2010-05-08 ENCOUNTER — Telehealth: Payer: Self-pay | Admitting: Cardiology

## 2010-05-08 LAB — CONVERTED CEMR LAB
CO2: 34 meq/L — ABNORMAL HIGH (ref 19–32)
Chloride: 90 meq/L — ABNORMAL LOW (ref 96–112)
Glucose, Bld: 130 mg/dL — ABNORMAL HIGH (ref 70–99)
Potassium: 4.4 meq/L (ref 3.5–5.3)
Sodium: 141 meq/L (ref 135–145)

## 2010-05-09 ENCOUNTER — Encounter: Payer: Self-pay | Admitting: Cardiology

## 2010-05-10 ENCOUNTER — Encounter (INDEPENDENT_AMBULATORY_CARE_PROVIDER_SITE_OTHER): Payer: Self-pay | Admitting: *Deleted

## 2010-05-10 ENCOUNTER — Inpatient Hospital Stay (HOSPITAL_COMMUNITY): Admission: EM | Admit: 2010-05-10 | Discharge: 2010-05-19 | Payer: Self-pay | Admitting: Emergency Medicine

## 2010-05-10 ENCOUNTER — Telehealth: Payer: Self-pay | Admitting: Cardiology

## 2010-05-10 ENCOUNTER — Ambulatory Visit: Payer: Self-pay | Admitting: Internal Medicine

## 2010-05-10 DIAGNOSIS — E1165 Type 2 diabetes mellitus with hyperglycemia: Secondary | ICD-10-CM

## 2010-05-10 DIAGNOSIS — J9600 Acute respiratory failure, unspecified whether with hypoxia or hypercapnia: Secondary | ICD-10-CM | POA: Insufficient documentation

## 2010-05-11 ENCOUNTER — Ambulatory Visit: Payer: Self-pay | Admitting: Internal Medicine

## 2010-05-19 ENCOUNTER — Encounter: Payer: Self-pay | Admitting: Cardiology

## 2010-05-22 ENCOUNTER — Encounter: Payer: Self-pay | Admitting: Cardiology

## 2010-05-23 ENCOUNTER — Encounter: Payer: Self-pay | Admitting: Cardiology

## 2010-06-06 ENCOUNTER — Encounter: Payer: Self-pay | Admitting: Cardiology

## 2010-06-07 ENCOUNTER — Ambulatory Visit: Payer: Self-pay | Admitting: Cardiology

## 2010-06-07 DIAGNOSIS — R110 Nausea: Secondary | ICD-10-CM

## 2010-06-08 ENCOUNTER — Encounter: Payer: Self-pay | Admitting: Internal Medicine

## 2010-06-08 LAB — CONVERTED CEMR LAB
Albumin: 4.3 g/dL (ref 3.5–5.2)
Alkaline Phosphatase: 106 units/L (ref 39–117)
BUN: 44 mg/dL — ABNORMAL HIGH (ref 6–23)
Basophils Absolute: 0.1 10*3/uL (ref 0.0–0.1)
Basophils Relative: 0.4 % (ref 0.0–3.0)
Bilirubin, Direct: 0.1 mg/dL (ref 0.0–0.3)
CO2: 26 meq/L (ref 19–32)
Calcium: 10.2 mg/dL (ref 8.4–10.5)
Chloride: 86 meq/L — ABNORMAL LOW (ref 96–112)
Creatinine, Ser: 2.3 mg/dL — ABNORMAL HIGH (ref 0.4–1.2)
Eosinophils Absolute: 0.1 10*3/uL (ref 0.0–0.7)
Glucose, Bld: 306 mg/dL — ABNORMAL HIGH (ref 70–99)
Lipase: 44 units/L (ref 11.0–59.0)
Lymphocytes Relative: 7.2 % — ABNORMAL LOW (ref 12.0–46.0)
MCHC: 33.4 g/dL (ref 30.0–36.0)
MCV: 91.5 fL (ref 78.0–100.0)
Monocytes Absolute: 1.2 10*3/uL — ABNORMAL HIGH (ref 0.1–1.0)
Neutrophils Relative %: 83.7 % — ABNORMAL HIGH (ref 43.0–77.0)
RBC: 4.51 M/uL (ref 3.87–5.11)
RDW: 15.4 % — ABNORMAL HIGH (ref 11.5–14.6)
Total Protein: 7.4 g/dL (ref 6.0–8.3)

## 2010-06-09 ENCOUNTER — Ambulatory Visit (HOSPITAL_COMMUNITY): Admission: RE | Admit: 2010-06-09 | Discharge: 2010-06-09 | Payer: Self-pay | Admitting: Cardiology

## 2010-06-12 ENCOUNTER — Encounter: Payer: Self-pay | Admitting: Cardiology

## 2010-06-20 ENCOUNTER — Telehealth: Payer: Self-pay | Admitting: Emergency Medicine

## 2010-06-20 ENCOUNTER — Ambulatory Visit: Payer: Self-pay | Admitting: Internal Medicine

## 2010-06-20 DIAGNOSIS — R1319 Other dysphagia: Secondary | ICD-10-CM

## 2010-06-20 DIAGNOSIS — K219 Gastro-esophageal reflux disease without esophagitis: Secondary | ICD-10-CM

## 2010-06-27 ENCOUNTER — Encounter: Payer: Self-pay | Admitting: Internal Medicine

## 2010-06-28 ENCOUNTER — Ambulatory Visit: Payer: Self-pay | Admitting: Internal Medicine

## 2010-06-28 ENCOUNTER — Ambulatory Visit (HOSPITAL_COMMUNITY): Admission: RE | Admit: 2010-06-28 | Discharge: 2010-06-28 | Payer: Self-pay | Admitting: Internal Medicine

## 2010-06-28 HISTORY — PX: ESOPHAGOGASTRODUODENOSCOPY: SHX1529

## 2010-06-30 ENCOUNTER — Encounter (HOSPITAL_COMMUNITY): Admission: RE | Admit: 2010-06-30 | Discharge: 2010-06-30 | Payer: Self-pay | Admitting: Internal Medicine

## 2010-07-11 ENCOUNTER — Ambulatory Visit: Payer: Self-pay | Admitting: Cardiology

## 2010-07-13 LAB — CONVERTED CEMR LAB
Basophils Absolute: 0.1 10*3/uL (ref 0.0–0.1)
Creatinine, Ser: 1.1 mg/dL (ref 0.4–1.2)
Eosinophils Absolute: 0.1 10*3/uL (ref 0.0–0.7)
HCT: 34.4 % — ABNORMAL LOW (ref 36.0–46.0)
Lymphs Abs: 1.3 10*3/uL (ref 0.7–4.0)
MCHC: 33.9 g/dL (ref 30.0–36.0)
MCV: 91.7 fL (ref 78.0–100.0)
Monocytes Absolute: 0.8 10*3/uL (ref 0.1–1.0)
Platelets: 345 10*3/uL (ref 150.0–400.0)
Potassium: 3.8 meq/L (ref 3.5–5.1)
Pro B Natriuretic peptide (BNP): 160.4 pg/mL — ABNORMAL HIGH (ref 0.0–100.0)
RDW: 14.8 % — ABNORMAL HIGH (ref 11.5–14.6)
Sodium: 139 meq/L (ref 135–145)

## 2010-07-25 ENCOUNTER — Telehealth: Payer: Self-pay | Admitting: Cardiology

## 2010-07-28 ENCOUNTER — Telehealth: Payer: Self-pay | Admitting: Cardiology

## 2010-08-01 ENCOUNTER — Ambulatory Visit: Payer: Self-pay | Admitting: Cardiology

## 2010-08-03 ENCOUNTER — Encounter: Payer: Self-pay | Admitting: Cardiology

## 2010-08-07 ENCOUNTER — Encounter: Payer: Self-pay | Admitting: Cardiology

## 2010-08-07 ENCOUNTER — Telehealth: Payer: Self-pay | Admitting: Cardiology

## 2010-08-08 ENCOUNTER — Encounter: Payer: Self-pay | Admitting: Cardiology

## 2010-08-14 ENCOUNTER — Telehealth: Payer: Self-pay | Admitting: Cardiology

## 2010-08-14 LAB — CONVERTED CEMR LAB
Chloride: 93 meq/L — ABNORMAL LOW (ref 96–112)
Potassium: 3.9 meq/L (ref 3.5–5.3)

## 2010-08-24 ENCOUNTER — Encounter (INDEPENDENT_AMBULATORY_CARE_PROVIDER_SITE_OTHER): Payer: Self-pay | Admitting: *Deleted

## 2010-10-02 ENCOUNTER — Ambulatory Visit: Payer: Self-pay | Admitting: Cardiology

## 2010-10-09 LAB — CONVERTED CEMR LAB
CO2: 34 meq/L — ABNORMAL HIGH (ref 19–32)
Calcium: 9.4 mg/dL (ref 8.4–10.5)
Glucose, Bld: 305 mg/dL — ABNORMAL HIGH (ref 70–99)
Sodium: 140 meq/L (ref 135–145)

## 2010-10-11 ENCOUNTER — Telehealth (INDEPENDENT_AMBULATORY_CARE_PROVIDER_SITE_OTHER): Payer: Self-pay | Admitting: *Deleted

## 2010-10-15 ENCOUNTER — Emergency Department (HOSPITAL_COMMUNITY)
Admission: EM | Admit: 2010-10-15 | Discharge: 2010-10-15 | Payer: Self-pay | Source: Home / Self Care | Admitting: Emergency Medicine

## 2010-10-20 ENCOUNTER — Ambulatory Visit (HOSPITAL_COMMUNITY)
Admission: RE | Admit: 2010-10-20 | Discharge: 2010-10-20 | Payer: Self-pay | Source: Home / Self Care | Attending: Orthopaedic Surgery | Admitting: Orthopaedic Surgery

## 2010-11-19 LAB — CONVERTED CEMR LAB
BUN: 22 mg/dL (ref 6–23)
Basophils Absolute: 0 10*3/uL (ref 0.0–0.1)
CO2: 32 meq/L (ref 19–32)
Calcium: 9.1 mg/dL (ref 8.4–10.5)
Calcium: 9.6 mg/dL (ref 8.4–10.5)
Creatinine, Ser: 1 mg/dL (ref 0.4–1.2)
Creatinine, Ser: 1.2 mg/dL (ref 0.4–1.2)
Eosinophils Absolute: 0.2 10*3/uL (ref 0.0–0.7)
GFR calc non Af Amer: 49.74 mL/min (ref >60)
GFR calc non Af Amer: 59.22 mL/min (ref >60)
Glucose, Bld: 154 mg/dL — ABNORMAL HIGH (ref 70–99)
Hemoglobin: 12.5 g/dL (ref 12.0–15.0)
Lymphocytes Relative: 13.3 % (ref 12.0–46.0)
MCHC: 34.5 g/dL (ref 30.0–36.0)
Monocytes Relative: 8.9 % (ref 3.0–12.0)
Neutro Abs: 8.3 10*3/uL — ABNORMAL HIGH (ref 1.4–7.7)
Neutrophils Relative %: 75.7 % (ref 43.0–77.0)
Platelets: 284 10*3/uL (ref 150.0–400.0)
Prothrombin Time: 10.7 s (ref 9.1–11.7)
RDW: 13 % (ref 11.5–14.6)
Sodium: 138 meq/L (ref 135–145)
aPTT: 30.7 s — ABNORMAL HIGH (ref 21.7–28.8)

## 2010-11-23 NOTE — Progress Notes (Signed)
   Phone Note Call from Patient   Call For: Dr Charlies Constable Details for Reason: Increased weight Summary of Call: Pt husb called because Meagan [name]'s wt is up to 198. She is not having much more SOB than usual. Her BUN/Cr were up last week. She has taken Metolazone before the lasix 80bid for 2 days and wt is still going up. Getting a little weak and increased DOE. Husb is concerned. Advised him that she should come to [facility] ER for eval. Initial call taken by: Park Breed PA-C,  December 04, 2009 7:33 AM

## 2010-11-23 NOTE — Progress Notes (Signed)
Summary: retain fluid  Phone Note Call from Patient Call back at Home Phone [phone]   Caller: Spouse Reason for Call: Talk to Nurse Summary of Call: per pt hubsand calling, pt taken ZAROXOLYN 2.5 MG TABS Take 1 tablet daily while weight is over 190 pounds.  When weight is 190 or less do not take zaroxolyn. wt now 192 . c/o still retain fluid.  Initial call taken by: Lorne Skeens,  May 08, 2010 8:34 AM  Follow-up for Phone Call        05/08/10--0900am--husband calling stating ms Essner not urinating enough and should she take her zaroxilyn today since her weight is 192# today--spoke with sister who states husband not there--she staes he is worried about her output but, also states pt is lying in bed and not drinking fluids--advised pt may not be urinating because not drinking--continue with zaroxilyn and push fluids to see if we get a response with fluid push--if not call us back--sister agrees--nt Follow-up by: Ledon Snare, RN,  May 08, 2010 9:00 AM     Appended Document: retain fluid 05/08/10--1340pm-- dr Keashia Haskins--i spoke to dr fusco's nurse in [place] (melody) and she stated they would get repeat BMET tomorrow when she goes for appoint. with him--nt

## 2010-11-23 NOTE — Progress Notes (Signed)
Summary: lab order for [facility]  Phone Note Outgoing Call   Summary of Call: We need to call Byrd Hesselbach with [facility] @ [phone] to see if she can draw a bmet in 2 weeks on the pt. Since it is after 5 pm I will ask the triage nurse to please call tomorrow.  Initial call taken by: Sherri Rad, RN, BSN,  March 30, 2010 5:18 PM  Follow-up for Phone Call        SPOKE WITH MARIA WHO WILL CHECK TO SEE IF PT IS STILL RECEIVING TX THRU THEM.  SHE DOESN'T THINK SHE IS AN ACTIVE PT AT THIS TIME.  SHE WILL CALL BACK TO LET us KNOW.  maria called back, pt was discharged 5/18.  Will call pt to let her know she needs labwork in 2 weeks 9:50am  Sander Nephew, RN LM on Voicemail of need for lab with number to call back for instructions Follow-up by: Charolotte Capuchin, RN,  March 31, 2010 9:42 AM  Additional Follow-up for Phone Call Additional follow up Details #1::        Spoke with patient.  She did not know that she was no longer with home health.  Told her that Dr. Juanda Chance wanted her to get some labwork done next week.  She said she would go to [facility] in [place] so I sent her a prescription.  She verbalized understanding that she needs to get this done next week. Additional Follow-up by: Minerva Areola, RN, BSN,  April 06, 2010 10:28 AM

## 2010-11-23 NOTE — Progress Notes (Signed)
Summary: SOB/CP   Phone Note Call from Patient   Caller: Patient Summary of Call: The pt called today reporting continued SOB. Her weight is 179 lbs today. She is complaining of upper chest and neck pain today. She took a NTG about 10 minutes ago with some relief. She has been having the pain for about 3 days, but just when she gets up and moves. She is still on 3 liters of O2. She is anxious about her weight and SOB. I explained she may be hypoventilating and therefore not oxygenating very well. She complained of nausea this morning. I will review with Dr. Juanda Chance and call her back. Initial call taken by: Sherri Rad, RN, BSN,  July 28, 2010 11:41 AM  Follow-up for Phone Call        The above was reviewed with Dr. Juanda Chance. Orders received to have the pt increase her lasix to 80mg  two times a day and to f/u with him early next week. I have explained this to the pt. She verbalizes understanding and will see Dr. Juanda Chance on tues 10/11 at 4:15pm. Follow-up by: Sherri Rad, RN, BSN,  July 28, 2010 1:08 PM

## 2010-11-23 NOTE — Assessment & Plan Note (Signed)
Summary: 4wk f/u    Visit Type:  4 wk Referring Provider:  Dr. Charlies Constable Primary Provider:  dr Sherwood Gambler  CC:  )2 is 92.  History of Present Illness: The patient is 65 years old and returns for management of CHF. She has had prior bypass surgery and prior PCI procedures. She has severe diastolic heart failure and has severe pulmonary hypertension with systolic pulmonary pressures documented of 100 at catheterization.   She has been struggling over the past several weeks. Her oxygen ran out she had a syncopal episode. She is barely able to get around and can barely get from the bed to the bathroom. She is using chronic oxygen therapy. She has not had very many good days.  She and her husband have been titrating her fluid. If her weight gets over 195 pounds she takes 240 mg Lasix tablets and if it is under 195 pound sheets takes one. Headache is over 200 she is to call.  Problems Prior to Update: 1)  Syncope and Collapse  (ICD-780.2) 2)  Chest Pain  (ICD-786.50) 3)  Shortness of Breath  (ICD-786.05) 4)  CHF  (ICD-428.0) 5)  Chronic Airway Obstruction Nec  (ICD-496) 6)  Encounter For Long-term Use of Other Medications  (ICD-V58.69) 7)  Unspecified Disorder of Kidney and Ureter  (ICD-593.9) 8)  Acute On Chronic Diastolic Heart Failure  (ICD-428.33) 9)  Pulmonary Hypertension  (ICD-416.8) 10)  Renal Insufficiency  (ICD-588.9) 11)  Hyperlipidemia-mixed  (ICD-272.4) 12)  Diastolic Heart Failure, Chronic  (ICD-428.32) 13)  Cad, Autologous Bypass Graft  (ICD-414.02)  Current Medications (verified): 1)  Cymbalta 60 Mg Cpep (Duloxetine Hcl) .... Once Daily 2)  Toprol Xl 100 Mg Xr24h-Tab (Metoprolol Succinate) .... Take One Tablet Once Daily 3)  Vytorin 10-80 Mg Tabs (Ezetimibe-Simvastatin) .... Take One Tablet Once Daily 4)  Lyrica 75 Mg Caps (Pregabalin) .Marland Kitchen.. 1 Tab  Three Times A Day 5)  Imdur 30 Mg Xr24h-Tab (Isosorbide Mononitrate) .... Take 1  Tablet Two Times A Day 6)  Diovan 40 Mg Tabs  (Valsartan) .Marland Kitchen.. 1 By Mouth Daily 7)  Lasix 40 Mg Tabs (Furosemide) .Marland Kitchen.. 1 Tab Two Times A Day 8)  Xanax 0.5 Mg Tabs (Alprazolam) .... As Needed 9)  Zyrtec Allergy 10 Mg Caps (Cetirizine Hcl) .Marland Kitchen.. 1 Tab Once Daily 10)  Albuterol Inhaler .... As Directed 11)  Insulin (Humalog Mix 75/25 Pen) .... 2 Shots Daily 12)  Aspirin 81 Mg Tbec (Aspirin) .... Take One Tablet By Mouth Daily 13)  Potassium Chloride Crys Cr 20 Meq Cr-Tabs (Potassium Chloride Crys Cr) .... Take One Tablet By Mouth Once Daily 14)  Aerochamber Plus  Misc (Spacer/aero-Holding Chambers) .... Use As Directed 15)  Prilosec 20 Mg Cpdr (Omeprazole) .Marland Kitchen.. 1 Tab Once Daily 16)  Metanx 3-35-2 Mg Tabs (L-Methylfolate-B6-B12) .... 2 Tab  Once Daily 17)  Pentazocine-Apap 25-650 Mg Tabs (Pentazocine-Apap) .... 2 Tabs As Needed For Pain 18)  Oxigen 3 Litters .... Daily  Allergies: 1)  ! Sulfa 2)  ! Codeine 3)  ! Niacin 4)  ! Jonne Ply  Past History:  Past Medical History: Reviewed history from 01/12/2010 and no changes required. Coronary artery disease status post coronary bypass grafting 1997, percutaneous intervention of the right coronary artery 2006 Fibromyalgia Diabetes mellitus Hypertension Hypercholesterolemia Diastolic CHF -EF 04% by echo 2010 Current Problems:  SYNCOPE AND COLLAPSE (ICD-780.2) CHEST PAIN (ICD-786.50) SHORTNESS OF BREATH (ICD-786.05) CHF (ICD-428.0) CHRONIC AIRWAY OBSTRUCTION NEC (ICD-496) ENCOUNTER FOR LONG-TERM USE OF OTHER MEDICATIONS (ICD-V58.69) UNSPECIFIED DISORDER OF  KIDNEY AND URETER (ICD-593.9) ACUTE ON CHRONIC DIASTOLIC HEART FAILURE (ICD-428.33) 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) RENAL INSUFFICIENCY (ICD-588.9) HYPERLIPIDEMIA-MIXED (ICD-272.4) DIASTOLIC HEART FAILURE, CHRONIC (ICD-428.32) CAD, AUTOLOGOUS BYPASS GRAFT (ICD-414.02)  Review of Systems       ROS is negative except as outlined in HPI.   Vital Signs:  Patient profile:   65 year old female Height:      60 inches Weight:       194 pounds Pulse rate:   59 / minute BP sitting:   119 / 60  (left arm) Cuff size:   large  Vitals Entered By: Oswald Hillock (January 13, 2010 4:25 PM)  Physical Exam  Additional Exam:  Gen. Well-nourished, in no distress   Neck: JVP 3 cm above the clavicle, thyroid not enlarged, no carotid bruits Lungs: No tachypnea, clear without rales, rhonchi or wheezes Cardiovascular: Rhythm regular, PMI not displaced,  heart sounds  normal, no murmurs or gallops, trace bilateral peripheral edema, pulses normal in all 4 extremities. Abdomen: BS normal, abdomen soft and non-tender without masses or organomegaly, no hepatosplenomegaly. MS: No deformities, no cyanosis or clubbing   Neuro:  No focal sns   Skin:  no lesions    Impression & Recommendations:  Problem # 1:  CHF (ICD-428.0) She has severe diastolic heart failure. She requires home O2 and barely can get from the bed to the bathroom. Her volume status appears about right. She does have JVD but not much edema. Her weights have been in our target range of 191-200. Her husband has a good idea of how to titrate her Lasix based on her weights as outlined in the history of present illness. I think her diastolic heart failure is been very difficult to control and I think she is so limited because of her severe pulmonary hypertension. She is scheduled to see Dr. Ortencia Kick next week. She is currently off her pulmonary vasodilators. She has also had intermittent renal insufficiency and had a BMP today. She is currently on 80 mg of Diovan and we'll decide whether to adjust the dosage downward depending on her BMP today.   Her updated medication list for this problem includes:    Toprol Xl 100 Mg Xr24h-tab (Metoprolol succinate) .Marland Kitchen... Take one tablet once daily    Imdur 30 Mg Xr24h-tab (Isosorbide mononitrate) .Marland Kitchen... Take 1  tablet two times a day    Diovan 40 Mg Tabs (Valsartan) .Marland Kitchen... 1 by mouth daily    Lasix 40 Mg Tabs (Furosemide) .Marland Kitchen... 1 tab two times  a day    Aspirin 81 Mg Tbec (Aspirin) .Marland Kitchen... Take one tablet by mouth daily  Problem # 2:  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) Her systolic pulmonary pressure was 100 at catheterization. She is scheduled to see Dr. Ortencia Kick next week.  Problem # 3:  CAD, AUTOLOGOUS BYPASS GRAFT (ICD-414.02) She has had previous bypass surgery and previous PCI procedures. She's had no chest pain this prompted stable. Her updated medication list for this problem includes:    Toprol Xl 100 Mg Xr24h-tab (Metoprolol succinate) .Marland Kitchen... Take one tablet once daily    Imdur 30 Mg Xr24h-tab (Isosorbide mononitrate) .Marland Kitchen... Take 1  tablet two times a day    Aspirin 81 Mg Tbec (Aspirin) .Marland Kitchen... Take one tablet by mouth daily  Other Orders: T-Basic Metabolic Panel [phone]) T-BNP  (B Natriuretic Peptide) [phone])  Patient Instructions: 1)  Your physician recommends that you have lab work today: bmet/bnp (428.0) 2)  Your physician recommends that you schedule a follow-up appointment in: 6  weeks.

## 2010-11-23 NOTE — Progress Notes (Signed)
Summary: PT IN HOSPITAL    Phone Note Call from Patient Call back at [phone]   Caller: Spouse Reason for Call: Talk to Nurse Summary of Call: PT IN THE HOSPITAL... REQUEST A CALL BACK FROM NURSE Initial call taken by: Migdalia Dk,  December 08, 2009 8:38 AM  Follow-up for Phone Call        I called and the pt's spouse is out and will be back in about 20 minutes. I will call back. Sherri Rad, RN, BSN  December 08, 2009 10:35 AM   I called back and spoke with the pt and her husband. They wanted to make sure I knew the pt is in the hospital . I explained that both I and Dr. Juanda Chance were aware of this. The pt states she lost 4 lbs of fluid yesterday. She has had trouble with hypoglycemia in the hospital per her account. She is very worried about her situation and the fact that cardiology has not seen her since monday. I explained I will contact our cardmaster to see if cardiology can come back and see her today. I will call them back after I speak with Trish. They are agreeable with this. Follow-up by: Sherri Rad, RN, BSN,  December 08, 2009 11:19 AM  Additional Follow-up for Phone Call Additional follow up Details #1::        I spoke with Trish. She will have someone come see the pt today. The pt is aware. She is concerned about having her lasix at the right dose when she goes home. I explained to her that adjusting her lasix would be based on her weight trends in the hospital, as well as her renal function. I advised her to write down any questions she might have and then the she can ask the cardiologist that will see her. The pt is agreeable.  Additional Follow-up by: Sherri Rad, RN, BSN,  December 08, 2009 12:47 PM

## 2010-11-23 NOTE — Progress Notes (Signed)
Summary: decreased 02 sats   Phone Note Other Incoming   Caller: Byrd Hesselbach- Home Health RN Summary of Call: Byrd Hesselbach called wanting to update Dr. Juanda Chance on the pt's condition. She is having some all over ext twitching that is new to Byrd Hesselbach but husband states is not new for the pt. Her Sats are 87-89% on 3L O2, she can get her sats up to 92% with deep breathing but pt cannot maintain. Pt call them yesterday saying she was hallucinating, no evidence of that today. Her wt gain is stable at 197. Her lungs sound the same (diminished all over). BP 106/60, HR 56-60 and Reg. 1+ lower ext edema. She just needed to pass this on in case Dr. Juanda Chance wanted to make any changes. I will pass this on to [facility], RN for f/u. Byrd Hesselbach is aware that Herbert Seta and Dr. Juanda Chance are both out of the office today. She agrees with this plan. She does not feel like the pt is in any distress.  Initial call taken by: Duncan Dull, RN, BSN,  December 26, 2009 10:45 AM  Follow-up for Phone Call        I discussed the above with Dr. Juanda Chance, he will have the pt keep her f/u appt on 12/28/09 with the PA.  Follow-up by: Sherri Rad, RN, BSN,  December 27, 2009 5:10 PM

## 2010-11-23 NOTE — Cardiovascular Report (Signed)
Summary: Heart Failure Program Status Report   Heart Failure Program Status Report   Imported By: Roderic Ovens 12/06/2009 15:08:49  _____________________________________________________________________  External Attachment:    Type:   Image     Comment:   External Document

## 2010-11-23 NOTE — Progress Notes (Signed)
Summary: speak to nurse/weight gain    Phone Note Call from Patient Call back at Home Phone [phone]   Caller: Spouse Reason for Call: Talk to Nurse Summary of Call: request to speak to nurse... weight gain, gain a 1lb overnight Initial call taken by: Migdalia Dk,  November 29, 2009 9:23 AM  Follow-up for Phone Call        I called the pt and spoke with her regarding her weight. She states her weight is up to 195lbs today. She took a metalozone 2.5mg  today. She states her weight was 194lbs yesterday and she took a metalozone 2.5mg  yesterday. I had explained to the pt that we need to be careful with her renal function and that she should not be taking the metalozone until her weight is 195 or above. She took her lasix about ago. She did not notice any extra urine output yesterday. I discussed the pt's fluid and diet with her. She states her diet has not changed. She told me at first that her fluid intake had increased. I starting asking her how much she was drinking. She could not really tell me the size cup, then she stated that she thought her intake was about the same. She states she has an insulated cup that she uses- she fills this up at breakfast and then again after breakfast- then she fills it up again at lunch. She drinks tea and sundrop. I advised the pt I am going to call her back in about 2-3 hours and see if she is putting anything out.  Follow-up by: Sherri Rad, RN, BSN,  November 29, 2009 10:22 AM  Additional Follow-up for Phone Call Additional follow up Details #1::        I discussed the above with Dr. Gala Romney since he is here in the office and the pt has CHF and pulmonary HTN. Per Dr. Gala Romney, the pt needs to come today for bmet/bnp and a weight check in the office. I called the pt and advised her of this. She will come after her husband gets off of work (they will be here around 3:00pm.  Additional Follow-up by: Sherri Rad, RN, BSN,  November 29, 2009 10:56 AM

## 2010-11-23 NOTE — Assessment & Plan Note (Signed)
Summary: pulm HTN, restrictive lung dz, diastolic CHF   Visit Type:  Follow-up Copy to:  Dr. Charlies Constable Primary Provider/Referring Provider:  dr Sherwood Gambler  CC:  1 month followup PAH.  Pt c/o increased SOB for the past several wks.  Pt also c/o swelling in stomach and ankles.  She was d/c'ed from [facility] on [facility].  She gets SOB with any exertion.  Also gets SOB when lies down.  She has to sit up to sleep.  Marland Kitchen  History of Present Illness: 65 yo woman, never smoker, with HTN, CAD/CABG, DM, diastolic dysfxn with several hospitalizations for volume overload and dyspnea over the last yr. She had cardiac cath 9/30 that showed CAD w patent graft, severe PAH (mean PAP > 60) with severely elevated wedge pressure. Her diuretics have been adjusted with significant improvement in her fluid status and her breathing. She has had PFT that show predominantly restriction, with possible coexisting mild obstruction. She was started on albuterol and she believes it does help some. 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   ROV 10/12/09 -- returns for f/u. walking oximetry shows desaturation, overnight oximetry without signifivcant desat, PSG without OSA. Has tried albuterol but is sure that she isn't taking it correctly - needs a spacer.   ROV 11/21/09 -- F/u today for her pulm HTN in setting LV dysfxn as detailed above, probably out of preportion to her known CHF. Tells me that she began to get URI symptoms about a week ago. Still with nasal congestion, dry cough, feels drained.   ROV/hosp f/u 12/20/09 -- Was discharged from hosp last Monday after diuresis. I restarted her Revatio in the hospital, note that she is also on scheduled Imdur. Adjustments made in diuretics 4 days ago by Dr Juanda Chance. She has put on 3 lbs since last week, tell me that she is feeling more dyspnea, orthopnea. She has also heard some wheeze at night when she lays flat. She can't tell that the Revatio has  helped her breathing at all. She has not uses SABA frequently, doesn't feel it helps her much.  h Current Medications (verified): 1)  Cymbalta 60 Mg Cpep (Duloxetine Hcl) .... Once Daily 2)  Toprol Xl 100 Mg Xr24h-Tab (Metoprolol Succinate) .... Take One Tablet Once Daily 3)  Vytorin 10-80 Mg Tabs (Ezetimibe-Simvastatin) .... Take One Tablet Once Daily 4)  Lyrica 75 Mg Caps (Pregabalin) .Marland Kitchen.. 1 Tab  Three Times A Day 5)  Imdur 30 Mg Xr24h-Tab (Isosorbide Mononitrate) .... Take 1  Tablet Two Times A Day 6)  Diovan 40 Mg Tabs (Valsartan) .Marland Kitchen.. 1 By Mouth Daily 7)  Lasix 40 Mg Tabs (Furosemide) .Marland Kitchen.. 1 Tab Two Times A Day 8)  Xanax 0.5 Mg Tabs (Alprazolam) .... As Needed 9)  Zyrtec Allergy 10 Mg Caps (Cetirizine Hcl) .Marland Kitchen.. 1 Tab Once Daily 10)  Albuterol Inhaler .... As Directed 11)  Insulin (Humalog Mix 75/25 Pen) .... 2 Shots Daily 12)  Aspirin 81 Mg Tbec (Aspirin) .... Take One Tablet By Mouth Daily 13)  Potassium Chloride Crys Cr 20 Meq Cr-Tabs (Potassium Chloride Crys Cr) .... Take One Tablet By Mouth Once Daily 14)  Aerochamber Plus  Misc (Spacer/aero-Holding Chambers) .... Use As Directed 15)  Prilosec 20 Mg Cpdr (Omeprazole) .Marland Kitchen.. 1 Tab Once Daily 16)  Metanx 3-35-2 Mg Tabs (L-Methylfolate-B6-B12) .... 2 Tab  Once Daily 17)  Pentazocine-Apap 25-650 Mg Tabs (Pentazocine-Apap) .... 2 Tabs As Needed For Pain 18)  Revatio 20 Mg Tabs (Sildenafil Citrate) .Marland Kitchen.. 1 By Mouth Three Times A Day  Allergies (verified): 1)  ! Sulfa 2)  ! Codeine 3)  ! Niacin 4)  ! Asa  Vital Signs:  Patient profile:   65 year old female Weight:      202 pounds O2 Sat:      91 % on 3 L/min Temp:     97.8 degrees F oral Pulse rate:   78 / minute BP sitting:   106 / 60  (left arm) Cuff size:   large  Vitals Entered By: Vernie Murders (December 20, 2009 4:12 PM)  O2 Flow:  3 L/min  Physical Exam  General:  debilitated obese woman in whelchair.   Head:  normocephalic and atraumatic Nose:  no deformity,  discharge, inflammation, or lesions Mouth:  no deformity or lesions Neck:  no masses, thyromegaly, or abnormal cervical nodes Lungs:  small volumes, few basilar insp crackles that clear with deep insp, no wheezing Heart:  distant, regular, no M Abdomen:  obese, benign Msk:  no deformity or scoliosis noted with normal posture Extremities:  no edema Neurologic:  non-focal Skin:  bruising on UE's Psych:  alert and cooperative; normal mood and affect; normal attention span and concentration   Impression & Recommendations:  Problem # 1:  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)  Her dyspnea and volume status don't seem to be better on sildenifil, in fact I'm concerned that she has decompensated some on the med. I'll ask her to stop for now, discuss status with Dr Juanda Chance. She has the body habitus that is worrisome for OSA and OHS, but her PSG was reassuring.   Stop Revatio for now.  We will revisit the use of this medication in the future once your diuretic medications and weight are stable. Dr Delton Coombes will discuss your status with Dr Juanda Chance.  Follow up in 1 month.   Orders: Est. Patient Level IV (16109)  Problem # 2:  DIASTOLIC HEART FAILURE, CHRONIC (ICD-428.32)  Her updated medication list for this problem includes:    Toprol Xl 100 Mg Xr24h-tab (Metoprolol succinate) .Marland Kitchen... Take one tablet once daily    Diovan 40 Mg Tabs (Valsartan) .Marland Kitchen... 1 by mouth daily    Lasix 40 Mg Tabs (Furosemide) .Marland Kitchen... 1 tab two times a day    Aspirin 81 Mg Tbec (Aspirin) .Marland Kitchen... Take one tablet by mouth daily  Medications Added to Medication List This Visit: 1)  Lasix 40 Mg Tabs (Furosemide) .Marland Kitchen.. 1 tab two times a day  Patient Instructions: 1)  Stop Revatio for now.  2)  We will revisit the use of this medication in the future once your diuretic medications and weight are stable. 3)  Dr Delton Coombes will discuss your status with Dr Juanda Chance.  4)  Follow up in 1 month.

## 2010-11-23 NOTE — Progress Notes (Signed)
Summary: F/U on weight   Phone Note Outgoing Call   Call placed by: Sherri Rad, RN, BSN,  November 30, 2009 9:38 AM Call placed to: Patient Summary of Call: I called the pt this morning to see how she is feeling today. Her weight is down to 193lbs on her home scale this morning. She states she did have more urine output last night. I explained to her that I have discussed the plan that we established with her yesterday per Dr. Gala Romney, with Dr. Juanda Chance. Dr. Juanda Chance is in support of the current plan to have the pt continue lasix 80mg  two times a day x 3 days (today is day 2), repeat a bmet/bnp/chest x-ray on friday, and see Dr. Juanda Chance on 12/05/09. The pt verbalizes understanding and is agreeable with the plan. Initial call taken by: Sherri Rad, RN, BSN,  November 30, 2009 9:43 AM     Appended Document: F/U on weight The pt has been instructed to report to the ER if her symptoms are worsening. She verbalizes understanding.

## 2010-11-23 NOTE — Assessment & Plan Note (Signed)
Summary: rov  Medications Added LASIX 40 MG TABS (FUROSEMIDE) take two daily METOLAZONE 2.5 MG TABS (METOLAZONE) take as needed        Visit Type:  Follow-up Referring Provider:  Dr. Charlies Constable Primary Provider:  dr Sherwood Gambler  CC:  weight up and down and sob.  History of Present Illness: This is a 65 year old white female patient, who has history of diastolic heart failure. She was recently started on home oxygen 2 weeks ago by Dr. Corliss Blacker. She comes in today with worsening shortness of breath. She says her weight continues to climb. She took an x-ray data milligrams of Lasix yesterday and her weight went from 197-194. Her on her home scale. Her dry weight here have been in the 188 range, but she has been much higher than that recently. She does not follow a strict low sodium diet, but does not add salt to her food. She also complains of severe leg pain and tingling bilaterally  Current Medications (verified): 1)  Cymbalta 60 Mg Cpep (Duloxetine Hcl) .... Once Daily 2)  Toprol Xl 100 Mg Xr24h-Tab (Metoprolol Succinate) .... Take One Tablet Once Daily 3)  Vytorin 10-80 Mg Tabs (Ezetimibe-Simvastatin) .... Take One Tablet Once Daily 4)  Lyrica 100 Mg Caps (Pregabalin) .... Take 3 Tabs Daily 5)  Imdur 30 Mg Xr24h-Tab (Isosorbide Mononitrate) .... Take 1  Tablet Two Times A Day 6)  Diovan 40 Mg Tabs (Valsartan) .Marland Kitchen.. 1 By Mouth Daily 7)  Lasix 40 Mg Tabs (Furosemide) .... Take Two Daily 8)  Actos 15 Mg Tabs (Pioglitazone Hcl) .... Once Daily 9)  Xanax 0.5 Mg Tabs (Alprazolam) .... As Needed 10)  Ultram 50 Mg Tabs (Tramadol Hcl) .... Once Daily 11)  Zyrtec Allergy 10 Mg Caps (Cetirizine Hcl) .Marland Kitchen.. 1 Tab Once Daily 12)  Albuterol Inhaler .... As Directed 13)  Phenegren 25mg  .... As Needed 14)  Insulin (Humalog Mix 75/25 Pen) .... 2 Shots Daily 15)  Aspirin 81 Mg Tbec (Aspirin) .... Take One Tablet By Mouth Daily 16)  Potassium Chloride Crys Cr 20 Meq Cr-Tabs (Potassium Chloride Crys Cr) ....  Take One Tablet By Mouth Once Daily 17)  Aerochamber Plus  Misc (Spacer/aero-Holding Chambers) .... Use As Directed 18)  Metolazone 2.5 Mg Tabs (Metolazone) .... Take As Needed  Allergies: 1)  ! Sulfa 2)  ! Codeine 3)  ! Niacin 4)  ! Jonne Ply  Past History:  Past Medical History: Last updated: 07/18/2009 Coronary artery disease status post coronary bypass grafting 1997, percutaneous intervention of the right coronary artery 2006 Fibromyalgia Diabetes mellitus Hypertension Hypercholesterolemia Diastolic CHF -EF 14% by echo 2010  Review of Systems       see history of present illness. She denies chest pain, palpitations, dizziness, or presyncope  Vital Signs:  Patient profile:   65 year old female Height:      60 inches Weight:      197 pounds Pulse rate:   59 / minute Pulse rhythm:   regular BP sitting:   105 / 51  (left arm)  Vitals Entered By: Jacquelin Hawking, CMA (November 02, 2009 12:26 PM)  Physical Exam  General:  Elderly white female, in no acute distress. Neck: Increased JVD, HJR, No Bruit, or thyroid enlargement Lungs: No tachypnea, decreased breath sounds with bibasilar Rales Cardiovascular: RRR, distant heart sounds,PMI not displaced,  no murmurs, gallops, bruit, thrill, or heave. Abdomen: BS normal. Soft without organomegaly, masses, lesions or tenderness. Extremities: without cyanosis, clubbing or edema. Good distal pulses bilateral SKin:  Warm, no lesions or rashes  Musculoskeletal: No deformities Neuro: no focal signs    Impression & Recommendations:  Problem # 1:  ACUTE ON CHRONIC DIASTOLIC HEART FAILURE (ICD-428.33) Patient has acute on chronic diastolic heart failure. I've asked her to increase her metolazone one tablet 3 times weekly. I instructed her on a 2 g sodium diet and gave her a copy. Her updated medication list for this problem includes:    Toprol Xl 100 Mg Xr24h-tab (Metoprolol succinate) .Marland Kitchen... Take one tablet once daily    Imdur 30 Mg  Xr24h-tab (Isosorbide mononitrate) .Marland Kitchen... Take 1  tablet two times a day    Diovan 40 Mg Tabs (Valsartan) .Marland Kitchen... 1 by mouth daily    Lasix 40 Mg Tabs (Furosemide) .Marland Kitchen... Take two daily    Aspirin 81 Mg Tbec (Aspirin) .Marland Kitchen... Take one tablet by mouth daily    Metolazone 2.5 Mg Tabs (Metolazone) .Marland Kitchen... Take as needed  Problem # 2:  RENAL INSUFFICIENCY (ICD-588.9) Patient has chronic renal insufficiency, but her creatinine on Monday, was 1.2 to potassium was normal  Problem # 3:  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) blood pressure is stable  Problem # 4:  CAD, AUTOLOGOUS BYPASS GRAFT (ICD-414.02) patient is asymptomatic from this point of view. Cardiac catheter was done in September 2010, consistent with pulmonary hypertension, and no acute coronary disease. Patient does have drug eluding stents in her RCA. Her updated medication list for this problem includes:    Toprol Xl 100 Mg Xr24h-tab (Metoprolol succinate) .Marland Kitchen... Take one tablet once daily    Imdur 30 Mg Xr24h-tab (Isosorbide mononitrate) .Marland Kitchen... Take 1  tablet two times a day    Aspirin 81 Mg Tbec (Aspirin) .Marland Kitchen... Take one tablet by mouth daily  Patient Instructions: 1)  Your physician recommends that you schedule a follow-up appointment in: Pt has an appointment with.  Dr. Juanda Chance 11/16/09 2)  Your physician has requested that you limit the intake of sodium (salt) in your diet to two grams daily. Please see [facility] handout. 3)  Take Metolazone 2.5 mg today, Sat, & Monday. Call & see me if you are not feeling better.  4)  Weigh yourself daily. Call if weight is not coming down with increase in diuretics.

## 2010-11-23 NOTE — Progress Notes (Signed)
Summary: nos appt  Phone Note Call from Patient   Caller: [email or web address]  Call For: byrum Summary of Call: In ref to nos from 8/29, pt states she will call to rsc. Initial call taken by: Darletta Moll,  June 20, 2010 9:55 AM

## 2010-11-23 NOTE — Progress Notes (Signed)
Summary: unable to have lab work done today/ c/o talking out of head  Phone Note Call from Patient Call back at Home Phone [phone]   Caller: Spouse Reason for Call: Talk to Nurse Summary of Call: per pt hubsand calling, pt unable to have lab work done today. c/o talking out of head, decrease mother, father, no chestpain.  Initial call taken by: Lorne Skeens,  May 04, 2010 2:53 PM  Follow-up for Phone Call        I attempted to call the pt and her husband back. This message was only sent to Dr. Regino Schultze desktop yesterday and not to me. I attempted to leave a message and the pt's answering machine cut off. Follow-up by: Sherri Rad, RN, BSN,  May 05, 2010 5:48 PM

## 2010-11-23 NOTE — Assessment & Plan Note (Signed)
Summary: 1wk f/u per brodie    Visit Type:  1 wk f/u Referring Provider:  Dr. Charlies Constable Primary Provider:  dr Sherwood Gambler  CC:  shortness of breath.  History of Present Illness: This is a 65 year old white female patient, who is here today for followup on acute on chronic diastolic heart failure. She saw Dr. Charlies Constable last week, who adjusted her diuretics. She also saw Dr. Corliss Blacker yesterday, who stopped her,Revatio because he thought it may be making her worse.  The patient is on chronic oxygen at home and on her way here. Her portable tank stopped working. She became short of breath and had some chest tightness. We put her on our oxygen here in the office and her breathing was back to normal. She currently denies chest pain, palpitations. She is weighing herself daily and adjusting her Zaroxolyn based on her weight and is doing well. Her potassium was elevated last week and we will recheck that today.  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 Once Daily 2)  Toprol Xl 100 Mg Xr24h-Tab (Metoprolol Succinate) .... Take One Tablet Once Daily 3)  Vytorin 10-80 Mg Tabs (Ezetimibe-Simvastatin) .... Take One Tablet Once Daily 4)  Lyrica 75 Mg Caps (Pregabalin) .Marland Kitchen.. 1 Tab  Three Times A Day 5)  Imdur 30 Mg Xr24h-Tab (Isosorbide Mononitrate) .... Take 1  Tablet Two Times A Day 6)  Diovan 40 Mg Tabs  (Valsartan) .Marland Kitchen.. 1 By Mouth Daily 7)  Lasix 40 Mg Tabs (Furosemide) .Marland Kitchen.. 1 Tab Two Times A Day 8)  Xanax 0.5 Mg Tabs (Alprazolam) .... As Needed 9)  Zyrtec Allergy 10 Mg Caps (Cetirizine Hcl) .Marland Kitchen.. 1 Tab Once Daily 10)  Albuterol Inhaler .... As Directed 11)  Insulin (Humalog Mix 75/25 Pen) .... 2 Shots Daily 12)  Aspirin 81 Mg Tbec (Aspirin) .... Take One Tablet By Mouth Daily 13)  Potassium Chloride Crys Cr 20 Meq Cr-Tabs (Potassium Chloride Crys Cr) .... Take One Tablet By Mouth Once Daily 14)  Aerochamber Plus  Misc (Spacer/aero-Holding Chambers) .... Use As Directed 15)  Prilosec 20 Mg Cpdr (Omeprazole) .Marland Kitchen.. 1 Tab Once Daily 16)  Metanx 3-35-2 Mg Tabs (L-Methylfolate-B6-B12) .... 2 Tab  Once Daily 17)  Pentazocine-Apap 25-650 Mg Tabs (Pentazocine-Apap) .... 2 Tabs As Needed For Pain 18)  Revatio 20 Mg Tabs (Sildenafil Citrate) .Marland Kitchen.. 1 By Mouth Three Times A Day  Allergies: 1)  ! Sulfa 2)  ! Codeine 3)  ! Niacin 4)  ! Jonne Ply  Past History:  Past Medical History: Last updated: 07/18/2009 Coronary artery disease status post coronary bypass grafting 1997, percutaneous intervention of the right coronary artery 2006 Fibromyalgia Diabetes mellitus Hypertension Hypercholesterolemia Diastolic CHF -EF 16% by echo 2010  Review of Systems       see history of present illness. Patient is in a wheelchair on home oxygen and is chronically ill  Vital Signs:  Patient profile:   65  year old female Height:      60 inches Weight:      199 pounds BMI:     39.01 Pulse rate:   62 / minute BP sitting:   115 / 58  (left arm)  Vitals Entered By: Oswald Hillock (December 21, 2009 1:32 PM)  Physical Exam  General:  Obese, in no acute distress. Neck: slight increase in JVD, HJR, no Bruit, or thyroid enlargement Lungs: No tachypnea, clear without wheezing, rales, or rhonchi Cardiovascular: RRR, PMI not displaced, heart sounds normal, no murmurs, gallops, bruit, thrill, or heave. Abdomen: BS  normal. Soft without organomegaly, masses, lesions or tenderness. Extremities: without cyanosis, clubbing or edema. Good distal pulses bilateral SKin: Warm, no lesions or rashes  Musculoskeletal: No deformities Neuro: no focal signs    Impression & Recommendations:  Problem # 1:  ACUTE ON CHRONIC DIASTOLIC HEART FAILURE (ICD-428.33) Patient's weight is down 2 pounds since last week. She is tolerating taking her extra diuretics based on her weight. I will have her continue this same regimen. Will recheck BMET and BNP today and I will see her next week Her updated medication list for this problem includes:    Toprol Xl 100 Mg Xr24h-tab (Metoprolol succinate) .Marland Kitchen... Take one tablet once daily    Imdur 30 Mg Xr24h-tab (Isosorbide mononitrate) .Marland Kitchen... Take 1  tablet two times a day    Diovan 40 Mg Tabs (Valsartan) .Marland Kitchen... 1 by mouth daily    Lasix 40 Mg Tabs (Furosemide) .Marland Kitchen... 1 tab two times a day    Aspirin 81 Mg Tbec (Aspirin) .Marland Kitchen... Take one tablet by mouth daily  Problem # 2:  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) her Revatio was stopped yesterday. We will see how she does office medication  Problem # 3:  RENAL INSUFFICIENCY (ICD-588.9) recheck creatinine today  Other Orders: EKG w/ Interpretation (93000) TLB-BNP (B-Natriuretic Peptide) (83880-BNPR) TLB-BMP (Basic Metabolic Panel-BMET) (80048-METABOL)  Patient Instructions: 1)  Your physician recommends that you schedule a follow-up appointment in: Keep appointment with Jacolyn Reedy PA on March 9th and Dr. Juanda Chance March 25 th 2)  Your physician recommends that you return for lab work [id number] BNP,BMET.

## 2010-11-23 NOTE — Letter (Signed)
[facility]: [facility]   Imported By: Marylou Mccoy 07/27/2010 14:50:12  _____________________________________________________________________  External Attachment:    Type:   Image     Comment:   External Document

## 2010-11-23 NOTE — Assessment & Plan Note (Signed)
Summary: f18m   Visit Type:  Follow-up Referring Provider:  Sherwood Gambler Primary Provider:  Sherwood Gambler  CC:  sob.  History of Present Illness: Meagan [name] is a 65 and return for management of her CHF, CAD and OHS. 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  She was hospitalized in Sept with resp failure requiring intubation. Since that time she has done remarkably well.  She has lost a lot of weight and we have been managing her fluid status very closely. She feels like she is doing fairly well now. She does get short of breath with minimal exertion and her O2 sats did drop to about 80% with activity but run about 96% at rest. Her weight has decreased on her home scales from 171 to 167.  She has coronary artery disease s/p cabg and pci.    They would like f/u in [place] next year and will arrange with Meagan [name].  Current Medications (verified): 1)  Cymbalta 60 Mg Cpep (Duloxetine Hcl) .... Once Daily 2)  Toprol Xl 100 Mg Xr24h-Tab (Metoprolol Succinate) .... Take One Tablet Once Daily 3)  Vytorin 10-40 Mg Tabs (Ezetimibe-Simvastatin) .... Take One Tablet By Mouth Dailyat Bedtime 4)  Lasix 40 Mg Tabs (Furosemide) .... 2 Tabs Two Times A Day As Needed 5)  Xanax 0.5 Mg Tabs (Alprazolam) .... As Needed 6)  Zyrtec Allergy 10 Mg Caps (Cetirizine Hcl) .Marland Kitchen.. 1 Tab Once Daily 7)  Albuterol Inhaler .... As Directed 8)  Lantus 100 Unit/ml Soln (Insulin Glargine) .... Sliding Scle 9)  Aerochamber Plus  Misc (Spacer/aero-Holding Chambers) .... Use As Directed 10)  Prilosec 20 Mg Cpdr (Omeprazole) .Marland Kitchen.. 1 Tab By Mouth Two Times A Day 11)  Pentazocine-Apap 25-650 Mg Tabs (Pentazocine-Apap) .... 2 Tabs As Needed For Pain 12)  Oxygen 3 Liters .... Daily 13)  Zofran 4 Mg Tabs (Ondansetron Hcl) .... As Needed 14)  Isosorbide Mononitrate Cr 30 Mg Xr24h-Tab (Isosorbide Mononitrate) .... Take One Tablet By Mouth Daily  Allergies  (verified): 1)  ! Sulfa 2)  ! Codeine 3)  ! Niacin 4)  ! Jonne Ply  Past History:  Past Medical History: Reviewed history from 05/10/2010 and no changes required. Coronary artery disease status post coronary bypass grafting 1997, percutaneous intervention of the right coronary artery 2006 Fibromyalgia Diabetes mellitus Hypertension Hypercholesterolemia Diastolic CHF -EF 16% by echo 2010  SYNCOPE AND COLLAPSE (ICD-780.2) CHEST PAIN (ICD-786.50) SHORTNESS OF BREATH (ICD-786.05) CHF (ICD-428.0) CHRONIC AIRWAY OBSTRUCTION NEC (ICD-496) ENCOUNTER FOR LONG-TERM USE OF OTHER MEDICATIONS (ICD-V58.69) UNSPECIFIED DISORDER OF KIDNEY AND URETER (ICD-593.9) ACUTE ON CHRONIC DIASTOLIC HEART FAILURE (ICD-428.33)    - [facility] 07/21/09 PA 110/31 mean 62 with wedge 34 and CI  1.8/ PVR 8.5 woods units 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) RENAL INSUFFICIENCY (ICD-588.9)    - Creat 1.4 12/28/09 HYPERLIPIDEMIA-MIXED (ICD-272.4) DIASTOLIC HEART FAILURE, CHRONIC (ICD-428.32) CAD, AUTOLOGOUS BYPASS GRAFT (ICD-414.02)  Review of Systems       ROS is negative except as outlined in HPI.   Vital Signs:  Patient profile:   65 year old female Height:      60 inches Weight:      171 pounds Pulse rate:   72 / minute Pulse rhythm:   regular BP sitting:   130 / 70  (right arm)  Vitals Entered By: Meagan [name], CMA (October 02, 2010 4:11 PM)  Physical Exam  Additional Exam:  Gen. Well-nourished, in no distress   Neck: JVP just above the clavicle, thyroid not enlarged, no  carotid bruits Lungs: No tachypnea, clear without rales, rhonchi or wheezes Cardiovascular: Rhythm regular, PMI not displaced,  heart sounds  normal, no murmurs or gallops, trace peripheral edema, pulses normal in all 4 extremities. Abdomen: BS normal, abdomen soft and non-tender without masses or organomegaly, no hepatosplenomegaly. MS: No deformities, no cyanosis or clubbing   Neuro:  No focal sns   Skin:  no lesions    Impression &  Recommendations:  Problem # 1:  DIASTOLIC HEART FAILURE, CHRONIC (ICD-428.32) I think her volume status is pretty good right now. She has been very difficult to control because of her obesity-hypoventilation syndrome and pulmonary hypertension. I think she has lost some tissue weight since her last visit so I think we should set new parameters for  her ideal weight.  If her weight is between 164 and 170 pounds we will have her take her current dose of Lasix which is 240 mg tablets in the morning and one in the afternoon. If her weight gets to 171 or greater we will have her take 2 tablets twice a day. If her weight is 163 pounds or less we will have her take one tablet twice a day.  We will get a BMP today.  Under his for her ideal weight. Her updated medication list for this problem includes:    Toprol Xl 100 Mg Xr24h-tab (Metoprolol succinate) .Marland Kitchen... Take one tablet once daily    Lasix 40 Mg Tabs (Furosemide) .Marland Kitchen... 2 tabs two times a day as needed    Isosorbide Mononitrate Cr 30 Mg Xr24h-tab (Isosorbide mononitrate) .Marland Kitchen... Take one tablet by mouth daily  Problem # 2:  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) She has obesity-hypoventilation syndrome and pulmonary hypertension.  I think this is the biggest part of her problem. The main treatment is weight loss and she has done a great job and losing weight and her condition has improved as a result. I encouraged her to continue to get her weight down further.  Problem # 3:  CAD, AUTOLOGOUS BYPASS GRAFT (ICD-414.02) She has had prior bypass surgery and prior PCI procedures. She has good LV systolic function. She has had no chest pain. This prompted stable. Her updated medication list for this problem includes:    Toprol Xl 100 Mg Xr24h-tab (Metoprolol succinate) .Marland Kitchen... Take one tablet once daily    Isosorbide Mononitrate Cr 30 Mg Xr24h-tab (Isosorbide mononitrate) .Marland Kitchen... Take one tablet by mouth daily  Other Orders: EKG w/ Interpretation (93000) TLB-BMP  (Basic Metabolic Panel-BMET) (80048-METABOL)  Patient Instructions: 1)  Labwork today: bmet (428.0). 2)  Your physician recommends that you schedule a follow-up appointment in: 2 months with Meagan. Diona [name] in [place]. 3)  For weight 164 lbs- 170 lbs: take lasix 40mg  two tablets in the morning and one tablet in the evening. 4)  For weight 171 lbs- 172 lbs: take lasix 40mg  two tablets two times a day. 5)  For weight 162 lbs- 163 lbs: take lasix 40mg  one tablet by mouth two times a day. 6)  For weight 161 lbs or below or 173 lbs or more, call  Meagan. [facility] office in [place] for instructions.

## 2010-11-23 NOTE — Progress Notes (Signed)
Summary: Wt. gain   Phone Note Call from Patient Call back at [facility] Phone [phone]   Caller: Patient Reason for Call: Talk to Nurse Summary of Call: Patient would like to discuss labs.  She has gained one pound.  Initial call taken by: Burnard Leigh,  October 24, 2009 10:02 AM  Follow-up for Phone Call        [facility]. Sherri Rad, RN, BSN  October 24, 2009 4:14 PM  I disussed the above with Dr. Juanda Chance. He states the pt should f/u with the PA next week. Sherri Rad, RN, BSN  October 24, 2009 6:07 PM  I was able to contact the pt. She states she does not feel well. She is having some edema to her lower extremities. The pt states she saw her sister earlier today and her sister stated she looked "puffy" all over. Her weight is 196 today. The pt states she has some metolazone 2.5mg  tabs at home and she asked if she could take one of those or have some IV lasix. I asked the pt is she felt like she needed to go to the hospital, but she denied this. I discussed metolazone with Dr. Juanda Chance- per his recommendations, the pt can take a metolazone 2.5mg  now. She has been instructed to take this prior to her lasix. She has also been instructed that she should maintain her lasix 80mg  two times a day. If her weight is still not below 195lbs on thursday 10/27/09, she may take an additional metolazone 2.5mg  x one dose. The pt is to f/u with Delphina Cahill on 11/02/09 per Dr. Juanda Chance. The pt verbalizes understanding.   Follow-up by: Sherri Rad, RN, BSN,  October 24, 2009 6:23 PM

## 2010-11-23 NOTE — Assessment & Plan Note (Signed)
Summary: pulm HTN, URI   Visit Type:  Follow-up Copy to:  Dr. Charlies Constable Primary Provider/Referring Provider:  dr Sherwood Gambler  CC:  PAH. The patient c/o increased sob and cough and wheezing x5-6 days. Discuss starting Revatio.Marland Kitchen  History of Present Illness: 65 yo woman, never smoker, with HTN, CAD/CABG, DM, diastolic dysfxn with several hospitalizations for volume overload and dyspnea over the last yr. She had cardiac cath 9/30 that showed CAD w patent graft, severe PAH (mean PAP > 60) with severely elevated wedge pressure. Her diuretics have been adjusted with significant improvement in her fluid status and her breathing. She has had PFT that show predominantly restriction, with possible coexisting mild obstruction. She was started on albuterol and she believes it does help some. 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   ROV 10/12/09 -- returns for f/u. walking oximetry shows desaturation, overnight oximetry without signifivcant desat, PSG without OSA. Has tried albuterol but is sure that she isn't taking it correctly - needs a spacer.   ROV 11/21/09 -- F/u today for her pulm HTN in setting LV dysfxn as detailed above, probably out of preportion to her known CHF. Tells me that she began to get URI symptoms about a week ago. Still with nasal congestion, dry cough, feels drained.   Current Medications (verified): 1)  Cymbalta 60 Mg Cpep (Duloxetine Hcl) .... Once Daily 2)  Toprol Xl 100 Mg Xr24h-Tab (Metoprolol Succinate) .... Take One Tablet Once Daily 3)  Vytorin 10-80 Mg Tabs (Ezetimibe-Simvastatin) .... Take One Tablet Once Daily 4)  Lyrica 75 Mg Caps (Pregabalin) .Marland Kitchen.. 1 Tab  Three Times A Day 5)  Imdur 30 Mg Xr24h-Tab (Isosorbide Mononitrate) .... Take 1  Tablet Two Times A Day 6)  Diovan 40 Mg Tabs (Valsartan) .Marland Kitchen.. 1 By Mouth Daily 7)  Lasix 40 Mg Tabs (Furosemide) .... Take Two Daily 8)  Actos 15 Mg Tabs (Pioglitazone Hcl) .... Once  Daily 9)  Xanax 0.5 Mg Tabs (Alprazolam) .... As Needed 10)  Zyrtec Allergy 10 Mg Caps (Cetirizine Hcl) .Marland Kitchen.. 1 Tab Once Daily 11)  Albuterol Inhaler .... As Directed 12)  Insulin (Humalog Mix 75/25 Pen) .... 2 Shots Daily 13)  Aspirin 81 Mg Tbec (Aspirin) .... Take One Tablet By Mouth Daily 14)  Potassium Chloride Crys Cr 20 Meq Cr-Tabs (Potassium Chloride Crys Cr) .... Take One Tablet By Mouth Once Daily 15)  Aerochamber Plus  Misc (Spacer/aero-Holding Chambers) .... Use As Directed 16)  Metolazone 2.5 Mg Tabs (Metolazone) .... Take As Needed 17)  Prilosec 20 Mg Cpdr (Omeprazole) .Marland Kitchen.. 1 Tab Once Daily 18)  Metanx 3-35-2 Mg Tabs (L-Methylfolate-B6-B12) .... 2 Tab  Once Daily 19)  Pentazocine-Apap 25-650 Mg Tabs (Pentazocine-Apap) .... 2 Tabs As Needed For Pain  Allergies (verified): 1)  ! Sulfa 2)  ! Codeine 3)  ! Niacin 4)  ! Asa  Vital Signs:  Patient profile:   65 year old female Height:      60 inches (152.40 cm) Weight:      195 pounds (88.64 kg) BMI:     38.22 O2 Sat:      85 % on 3 L/min Temp:     97.8 degrees F (36.56 degrees C) oral Pulse rate:   64 / minute BP sitting:   110 / 72  (left arm) Cuff size:   large  Vitals Entered By: Michel Bickers CMA (November 21, 2009 4:00 PM)  O2 Sat at Rest %:  85 O2 Flow:  3 L/min  O2 Sat Comments Patients sats rechecked after resting 2 minutes and read at 90% on 3 liters and pulse was 58.Michel Bickers [facility]  November 21, 2009 4:02 PM  Physical Exam  General:  normal appearance and obese.   Head:  normocephalic and atraumatic Nose:  no deformity, discharge, inflammation, or lesions Mouth:  no deformity or lesions Neck:  no masses, thyromegaly, or abnormal cervical nodes Lungs:  small volumes, no crackles or whezes Heart:  distant, regular, no M Abdomen:  obese, benign Msk:  no deformity or scoliosis noted with normal posture Extremities:  no edema Neurologic:  non-focal Skin:  bruising on UE's Psych:  alert and cooperative;  normal mood and affect; normal attention span and concentration   Impression & Recommendations:  Problem # 1:  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)  We will start Revatio 20mg  by mouth three times a day. Call our office if you develop headache, light headedness or any other symptoms.  Continue your oxygen at 3L/min for now. We will ask your oxygen company to perform a walking titration and advise you on how to set your oxygen with exertion. Follow up with Dr Delton Coombes in 1 month to see how you are doing on the new medication  Orders: Est. Patient Level IV (16109)  Patient Instructions: 1)  We will start Revatio 20mg  by mouth three times a day. Call our office if you develop headache, light headedness or any other symptoms.  2)  Continue your oxygen at 3L/min for now. We will ask your oxygen company to perform a walking titration and advise you on how to set your oxygen with exertion. 3)  You may try decongestants that contain bromphenerimine or chlorphenerimine (check the ingredients). These are OK with your other medicines.  4)  Follow up with Dr Delton Coombes in 1 month to see how you are doing on the new medication

## 2010-11-23 NOTE — Cardiovascular Report (Signed)
Summary: Heart Failure Program Status Report   Heart Failure Program Status Report   Imported By: Roderic Ovens 01/19/2010 14:09:13  _____________________________________________________________________  External Attachment:    Type:   Image     Comment:   External Document

## 2010-11-23 NOTE — Assessment & Plan Note (Signed)
Summary: History and Physical Examination   Copy to:  Dr. Charlies Constable Primary Provider/Referring Provider:  dr Sherwood Gambler   History of Present Illness: 13 yowf never smoker, with HTN, CAD/CABG, DM, diastolic dysfxn with several hospitalizations for volume overload and dyspnea over the last yr. She had cardiac cath 07/21/09 that showed CAD w patent graft, severe PAH (mean PAP > 60) with severely elevated wedge pressure. Breathing has responded in past to diuretics and optimization of volume status. She has had PFT that show predominantly restriction, with possible coexisting mild obstruction. She was started on albuterol and felt  helped  some. 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   ROV 03/21/10 -- returns for f/u of BiVentricular failure, severe dyspnea. She was retried on Revatio at  r last visit, but began to gain wt, edema and more SOB only a few days after starting. This is the second time she has decompensated on the med. Was d/c'd by Dr Juanda Chance. She rarely uses albuterol. Currently 191-4 lbs, she adjusts her lasix based on target dry wt. rec  will not restart Revatio Continue your lasix and other cardiac medications as directed by Dr Welford Roche may benefit from cardiopulmonary rehab at some point in the future.  You may use Albuterol 2 puffs up to every 4 hours if needed for shortness of breath.   May 10, 2010 ER EVAL called back to ER with labs by Brodie earlier in pm with ARI on diovan and lethargic on arrival with BS down to 27 > 298 after D10 but still lethargic so intubated and ccm called. no additional hx available.   no purulent secretions on et placement, hemodynamics ok  Medications Prior to Update: 1)  Cymbalta 60 Mg Cpep (Duloxetine Hcl) .... Once Daily 2)  Toprol Xl 100 Mg Xr24h-Tab (Metoprolol Succinate) .... Take One Tablet Once Daily 3)  Vytorin 10-80 Mg Tabs (Ezetimibe-Simvastatin) .... Take One Tablet Once Daily 4)  Lyrica  75 Mg Caps (Pregabalin) .Marland Kitchen.. 1 Tab  Three Times A Day 5)  Imdur 30 Mg Xr24h-Tab (Isosorbide Mononitrate) .... Take 1  Tablet Two Times A Day 6)  Diovan 160 Mg Tabs (Valsartan) .... Take 1 Tablet By Mouth Daily 7)  Lasix 40 Mg Tabs (Furosemide) .... 2 By Mouth Two Times A Day 8)  Xanax 0.5 Mg Tabs (Alprazolam) .... As Needed 9)  Zyrtec Allergy 10 Mg Caps (Cetirizine Hcl) .Marland Kitchen.. 1 Tab Once Daily 10)  Albuterol Inhaler .... As Directed 11)  Insulin (Humalog Mix 75/25 Pen) .... 2 Shots Daily 12)  Aspirin 81 Mg Tbec (Aspirin) .... Take One Tablet By Mouth Daily 13)  Potassium Chloride Crys Cr 20 Meq Cr-Tabs (Potassium Chloride Crys Cr) .... Take One Tablet By Mouth Once Daily 14)  Aerochamber Plus  Misc (Spacer/aero-Holding Chambers) .... Use As Directed 15)  Prilosec 20 Mg Cpdr (Omeprazole) .Marland Kitchen.. 1 Tab Once Daily 16)  Metanx 3-35-2 Mg Tabs (L-Methylfolate-B6-B12) .... 2 Tab  Once Daily 17)  Pentazocine-Apap 25-650 Mg Tabs (Pentazocine-Apap) .... 2 Tabs As Needed For Pain 18)  Oxygen 3 Liters .... Daily 19)  Zaroxolyn 2.5 Mg Tabs (Metolazone) .... Take 1 Tablet Daily While Weight Is Over 190 Pounds.  When Weight Is 190 or Less Do Not Take Zaroxolyn  Current Medications (verified): 1)  Cymbalta 60 Mg Cpep (Duloxetine Hcl) .... Once Daily 2)  Toprol Xl 100 Mg Xr24h-Tab (Metoprolol Succinate) .... Take One Tablet Once Daily 3)  Vytorin  10-80 Mg Tabs (Ezetimibe-Simvastatin) .... Take One Tablet Once Daily 4)  Lyrica 75 Mg Caps (Pregabalin) .Marland Kitchen.. 1 Tab  Three Times A Day 5)  Imdur 30 Mg Xr24h-Tab (Isosorbide Mononitrate) .... Take 1  Tablet Two Times A Day 6)  Diovan 160 Mg Tabs (Valsartan) .... Take 1 Tablet By Mouth Daily 7)  Lasix 40 Mg Tabs (Furosemide) .... 2 By Mouth Two Times A Day 8)  Xanax 0.5 Mg Tabs (Alprazolam) .... As Needed 9)  Zyrtec Allergy 10 Mg Caps (Cetirizine Hcl) .Marland Kitchen.. 1 Tab Once Daily 10)  Albuterol Inhaler .... As Directed 11)  Insulin (Humalog Mix 75/25 Pen) .... 2 Shots  Daily 12)  Aspirin 81 Mg Tbec (Aspirin) .... Take One Tablet By Mouth Daily 13)  Potassium Chloride Crys Cr 20 Meq Cr-Tabs (Potassium Chloride Crys Cr) .... Take One Tablet By Mouth Once Daily 14)  Aerochamber Plus  Misc (Spacer/aero-Holding Chambers) .... Use As Directed 15)  Prilosec 20 Mg Cpdr (Omeprazole) .Marland Kitchen.. 1 Tab Once Daily 16)  Metanx 3-35-2 Mg Tabs (L-Methylfolate-B6-B12) .... 2 Tab  Once Daily 17)  Pentazocine-Apap 25-650 Mg Tabs (Pentazocine-Apap) .... 2 Tabs As Needed For Pain 18)  Oxygen 3 Liters .... Daily 19)  Zaroxolyn 2.5 Mg Tabs (Metolazone) .... Take 1 Tablet Daily While Weight Is Over 190 Pounds.  When Weight Is 190 or Less Do Not Take Zaroxolyn  Allergies: 1)  ! Sulfa 2)  ! Codeine 3)  ! Niacin 4)  ! 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  Father died at 11 of myocardial infarction.  Mother died at 46 of oral cancer.  A brother is alive with heart disease and COPD at 42.  A sister is alive and well at 42.  Another sister is alive at 39 withcoronary  disease and hypertension.  Two brothers died in a motor vehicle accident.  Social History: Reviewed history from 08/23/2009 and no changes required. Married -No children Tobacco Use - No.  Alcohol Use - no Regular Exercise - yes-always very active Retired -- Clinical biochemist  Physical Exam  Additional Exam:  elderly wf, sedated intubated oral et in place neck supple Pupils minimally reactive, not tracking , neg doll's Lungs with slt coarsened bs RRR P 80 Abd obese/benign Ext without edema Neuro sedated    Impression & Recommendations:  Problem # 1:  RESPIRATORY FAILURE, ACUTE (ICD-518.81) Baseline bicarb level 39 so she probably has chronic resp failure secondary to ohs with decomp in setting of acute renal failure on ARB rx with limited co secondary to [facility].  For now rest on vent  Problem # 2:  RENAL INSUFFICIENCY (ICD-588.9) baseline 1.4 23/9/11, on diovan with no peripheral edema so probably relatively dry will hold diovan, gentle vol exp  Problem #  3:  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) Probably secondary to ohs plus diastolic chf, can't r/o chronic PE ? may need chronic empiric anticoagulation  Problem # 4:  DIASTOLIC HEART FAILURE, CHRONIC (ICD-428.32)  Her updated medication list for this problem includes:    Toprol Xl 100 Mg Xr24h-tab (Metoprolol succinate) .Marland Kitchen... Take one tablet once daily    Diovan 160 Mg Tabs (Valsartan) .Marland Kitchen... Take 1 tablet by mouth daily    Lasix 40 Mg Tabs (Furosemide) .Marland Kitchen... 2 by mouth two times a day    Aspirin 81 Mg Tbec (Aspirin) .Marland Kitchen... Take one tablet by mouth daily    Zaroxolyn 2.5 Mg Tabs (Metolazone) .Marland Kitchen... Take 1 tablet daily while weight is over 190 pounds.  when weight is 190 or less do not take zaroxolyn   Hold all diuretics, arb rx  r/o MI  Problem # 5:  DIABETES MELLITUS, TYPE II, CONTROLLED (ICD-250.00)  Hypoglycemia likely secondary to Acute renal failure, correct by holding insulin rx with D10w  Patient Instructions: 1)

## 2010-11-23 NOTE — Progress Notes (Signed)
Summary: waiting for response for physical therapy   Phone Note From Other Clinic   Caller: maria [facility] [phone] Request: Talk with Nurse Summary of Call: faxed request for physical therapy on  4/11 or 4/12. waiting on reponse. Initial call taken by: Lorne Skeens,  February 07, 2010 9:41 AM  Follow-up for Phone Call        wanted order for PT will have Heather call her tom. Meredith Staggers, RN  February 07, 2010 9:47 AM   I called and spoke with Byrd Hesselbach and explained I never received an order or request for Dr. Juanda Chance to order PT. Per Byrd Hesselbach, she contacted the pt's PCP and they gave her this order.  Follow-up by: Sherri Rad, RN, BSN,  February 09, 2010 9:09 AM

## 2010-11-23 NOTE — Progress Notes (Signed)
Summary: LAB ORDER   Phone Note Call from Patient Call back at [facility] Phone [phone]   Caller: Patient Reason for Call: Talk to Nurse Summary of Call: PT NEEDS LAB WORK FAX TO [facility] [phone]. PT NEEDS IT TO BE DONE BEFORE 5PM. Initial call taken by: Roe Coombs,  August 07, 2010 3:29 PM  Follow-up for Phone Call        I spoke with the pt and made her aware I had faxed an order to Dr. Sharyon Medicus office last week. She said she called today and the lab did not have the order. I will refax to the # given. Follow-up by: Sherri Rad, RN, BSN,  August 07, 2010 4:10 PM

## 2010-11-23 NOTE — Assessment & Plan Note (Signed)
Summary: REFRACTORY NAUSEA,VOMITING FOR 3 WEEKS/SS   Visit Type:  Initial Consult Referring Provider:  Fusco Primary Care Provider:  F[facility]co  Chief Complaint:  N/V .  History of Present Illness: 65 year old lady with insulin-dependent diabetes mellitus and diabetic neuropathy referred by Dr. Sherwood Gambler for further evaluation of a 10 week history of incessant nausea, vomiting and early satiety. She also has significant heartburn symptoms not really helped much with Prilosec. She tells me she has lost 30 pounds in the past 2 months. She was hospitalized with what sounds like insulin shock and congestive heart failure for 9 days in [place] a month or so ago. She was on the ventilator. Gallbladder is in situ. She's never been evaluated for gastroparesis. She has been taking hydrocodone and more recently pentazocin for fibromyalgia pain.   Current Medications (verified): 1)  Cymbalta 60 Mg Cpep (Duloxetine Hcl) .... Once Daily 2)  Toprol Xl 100 Mg Xr24h-Tab (Metoprolol Succinate) .... Take One Tablet Once Daily 3)  Vytorin 10-80 Mg Tabs (Ezetimibe-Simvastatin) .... Take One Tablet Once Daily 4)  Lasix 40 Mg Tabs (Furosemide) .Marland Kitchen.. 1 Tab  By Mouth Two Times A Day 5)  Xanax 0.5 Mg Tabs (Alprazolam) .... As Needed 6)  Zyrtec Allergy 10 Mg Caps (Cetirizine Hcl) .Marland Kitchen.. 1 Tab Once Daily 7)  Albuterol Inhaler .... As Directed 8)  Insulin (Humalog Mix 75/25 Pen) .... 30 Units Twice Daily 9)  Aerochamber Plus  Misc (Spacer/aero-Holding Chambers) .... Use As Directed 10)  Prilosec 20 Mg Cpdr (Omeprazole) .Marland Kitchen.. 1 Tab Once Daily 11)  Pentazocine-Apap 25-650 Mg Tabs (Pentazocine-Apap) .... 2 Tabs As Needed For Pain 12)  Oxygen 3 Liters .... Daily  Allergies (verified): 1)  ! Sulfa 2)  ! Codeine 3)  ! Niacin 4)  ! 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) ENCOUNTER FOR LONG-TERM USE OF OTHER MEDICATIONS (ICD-V58.69) UNSPECIFIED DISORDER OF KIDNEY AND URETER (ICD-593.9) ACUTE ON CHRONIC DIASTOLIC HEART FAILURE (ICD-428.33)    - RHC 07/21/09 PA 110/31 mean 62 with wedge 34 and CI  1.8/ PVR 8.5 woods units 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) RENAL INSUFFICIENCY (ICD-588.9)    - Creat 1.4 12/28/09 HYPERLIPIDEMIA-MIXED (ICD-272.4) DIASTOLIC HEART FAILURE, CHRONIC (ICD-428.32) CAD, AUTOLOGOUS BYPASS GRAFT (ICD-414.02)  Past Surgical History: Last updated: 07/18/2009 Status post hysterectomy CABG  Social History: Last updated: 08/23/2009 Married -No children Tobacco Use - No.  Alcohol Use - no Regular Exercise - yes-always very active Retired -- customer service  Risk Factors: Exercise: yes (07/18/2009)  Risk Factors: Smoking Status: never (08/23/2009)  Vital Signs:  Patient profile:   65 year old female Height:      60 inches Weight:      174 pounds BMI:     34.10 Temp:     97.6 degrees F oral Pulse rate:   80 / minute BP sitting:   100 / 80  (left arm) Cuff size:   regular  Vitals Entered By: Cloria Spring LPN (June 20, 2010 3:47 PM)  Physical Exam  General:  frail 65 year old lady with nasal prongs in place he is connected to oxygen concentrate her Eyes:  no sclerae or conjunctiva are pink Lungs:  clear to auscultation Heart:  regular rate rhythm without  murmur gallop or Abdomen:  obese positive bowel sounds no succussion splash. Abdomen is soft and nontender without appreciable mass or organomegaly  Impression & Recommendations: Impression: 65 year old long-standing insulin-dependent diabetes mellitus recent hospitalized with a critical illness with worsening of  chronic intermittent nausea and vomiting to the point now that her symptoms or pretty much incessant. She is  really devoid of any abdominal pain. She has significant reflux symptoms. She has esophageal dysphagia.  I suspect his wife lady has diabetic gastroparesis with secondary GERD; She may or may not have her ring, stricture or web to account for dysphagia.  I doubt she has a gastric outlet obstruction.  Recommendations: Diagnostic EGD with esophageal dilation as appropriate in the near future the hospital. Risks, benefits, limitations, alternatives and imponderables have been reviewed this nice lady and her husband. Her questions have been answered. They are agreeable.  Assuming she does have a partial gastric outlet obstruction, we will likely proceed with a solid phase gastric emptying study to quantify any degree of delay in gastric emptying.  If she does have gastroparesis, regular narcotic therapy may also be contributing to the clinical scenario.  I do not feel she has symptomatic gallbladder disease.  I'd like to thanks Dr. Sherwood Gambler for allowing me to see this nice lady today.  Appended Document: Orders Update    Clinical Lists Changes  Problems: Added new problem of GERD (ICD-530.81) Added new problem of DYSPHAGIA (ZOX-096.04) Orders: Added new Service order of New Patient Level IV (54098) - Signed

## 2010-11-23 NOTE — Progress Notes (Signed)
Summary: weight gain/trouble breathing  Phone Note Call from Patient Call back at Home Phone [phone]   Caller: Spouse Reason for Call: Talk to Nurse Summary of Call: weight gain up to 199, having trouble breathing Initial call taken by: Migdalia Dk,  April 13, 2010 2:49 PM  Follow-up for Phone Call        I called and spoke with the pt. She states her weight is up over the last few days. 6/20- 197/ 6/21-198/ 6/22- 199/ 6/23- 199. She has taken her 80mg  of lasix once daily based on the parameters we have set for her. She has not had a repeat bmet done- she states she is going tomorrow. I will discuss with the DOD since Dr. Juanda Chance is out of the office and call the pt back. The pt is agreeable. Follow-up by: Sherri Rad, RN, BSN,  April 13, 2010 4:22 PM  Additional Follow-up for Phone Call Additional follow up Details #1::        I spoke with one of our heart failure MD's, Dr. Antoine Poche, about the pt. Orders received to have the pt increase her lasix to 80mg  two times a day x 2 days, then take 40mg  two times a day x 2 days, with a repeat bmet on monday. I have called and explained this to the pt's husband. He verbalizes understanding. They have been advised to call the MD on call over the weekend should she not improve, or just report directly to the ER. The pt's husband is agreeable. I will f/u with her labs on Tues when I return to the office next week. The pt states she will have this done at [facility] in [place]. Additional Follow-up by: Sherri Rad, RN, BSN,  April 13, 2010 4:54 PM

## 2010-11-23 NOTE — Progress Notes (Signed)
Summary: lab results from [facility]  Phone Note Outgoing Call   Call placed by: Sherri Rad, RN, BSN,  May 10, 2010 9:30 AM Summary of Call: I called Dr. Sharyon Medicus office and requested the pt's lab results be faxed to me. Initial call taken by: Sherri Rad, RN, BSN,  May 10, 2010 9:30 AM

## 2010-11-23 NOTE — Medication Information (Signed)
Summary: Draw BMP  Draw BMP   Imported By: Debby Freiberg 04/21/2010 09:14:29  _____________________________________________________________________  External Attachment:    Type:   Image     Comment:   External Document

## 2010-11-23 NOTE — Progress Notes (Signed)
Summary: portable concentrator -  Phone Note Call from Patient Call back at Home Phone [phone]   Caller: Spouse//larry Call For: byrum Summary of Call: Requests rx to purchase a portable concentrator for pt. Initial call taken by: Darletta Moll,  October 11, 2010 4:02 PM  Follow-up for Phone Call        [employer] has a portable concentrator that will plug into the car and will go to 3 liters continuous. I have asked the pt's spouse to fax over the information he has on this system so Dr. Delton Coombes can review and we will call once Dr. Delton Coombes makes a decision as to whether this would be an appropriate system for the pt to have outside of the home.  Will await fax. Follow-up by: Michel Bickers CMA,  October 11, 2010 5:35 PM  Additional Follow-up for Phone Call Additional follow up Details #1::        FOrms have not been received. I called and spoke with the patient and she says they are checking into one other company before sending anything to [facility]. I will sign off on note and pt or her spouse will callback once they have checked out the other options. Additional Follow-up by: Michel Bickers CMA,  October 13, 2010 11:29 AM

## 2010-11-23 NOTE — Miscellaneous (Signed)
  Clinical Lists Changes  Observations: Added new observation of PFT RSLT: Based on the FEV1, there is a moderate obstruction lung defect. The airway obstruction is comfirmed by the decrease om f;ow rate at peak flow and flow at 75% of the flow volume curve. There is a moderate restrictive lung defect. There is a severe decrease in diffusing capacity. This is interpreted as an insignificant response to bronchodilator. (07/29/2009 14:13)      PFT  Procedure date:  07/29/2009  Findings:      Based on the FEV1, there is a moderate obstruction lung defect. The airway obstruction is comfirmed by the decrease om f;ow rate at peak flow and flow at 75% of the flow volume curve. There is a moderate restrictive lung defect. There is a severe decrease in diffusing capacity. This is interpreted as an insignificant response to bronchodilator.

## 2010-11-23 NOTE — Progress Notes (Signed)
Summary: Increased weight gain / still has wt gain   Phone Note Other Incoming   Caller: Olegario Messier with [facility] Summary of Call: Olegario Messier- RN with [facility] called this afternoon stating that the pt's weight is up to 202 lbs today. She took her metalozone 2.5mg  yesterday afternoon. She took her lasix 80mg  this morning about 8:45am. Her BP today is 100/58. The pt has voided once since taking her lasix this morning. She has increased swelling to her right leg, but her SOB is about the same as yesterday. I have discussed this with Dr. Juanda Chance and orders are for the pt to take metalozone 2.5mg  this afternoon and lasix 80mg  this afternoon.  Initial call taken by: Sherri Rad, RN, BSN,  December 15, 2009 2:03 PM  Follow-up for Phone Call        The pt is aware of the above orders. She verbalizes understanding. She will check her weight in the morning and if she is below 202 lbs, she will follow the guidelines we set for her yesterday. If she is 202 lbs or above, she will call me back tomorrow.   Follow-up by: Sherri Rad, RN, BSN,  December 15, 2009 4:27 PM  Additional Follow-up for Phone Call Additional follow up Details #1::        Per pt calling, need to talk with heather m regarding her fluid buil - up and what pills she should take. [phone] Lorne Skeens  December 16, 2009 8:22 AM  I called and spoke with the pt this morning. Her weight is still 202 lbs today and she has more swelling to her LE- her breathing is ok. Per Dr. Juanda Chance- take metalozone 2.5mg  this morning and lasix 80mg  after that, and take lasix 40mg  in the evening. Per Dr. Juanda Chance, the pt is to do the same thing on saturday and sunday if her weight is still 202 lbs or above. I have explained this to the pt. She verbalizes understanding. I instructed her to call the MD on call if her SOB worsens. Additional Follow-up by: Sherri Rad, RN, BSN,  December 16, 2009 9:28 AM

## 2010-11-23 NOTE — Assessment & Plan Note (Signed)
Summary: rov   Visit Type:  Follow-up Referring Rolan Wrightsman:  [place] Gambler Primary Teren Zurcher:  [place] Gambler  CC:  chest pain------sob Pts lasix was increased to 80mg  bid.Marland Kitchen  History of Present Illness: Meagan [name] is a 64 and return for management of her CHF and smooth. She has obesity-hyperventilation-syndrome with associated to hypertension and chronic hypoxia requiring home O2.  she was hospitalized a month ago with his doctor if requiring intubation. Since that time she has done remarkably well however 2 weeks ago she started to retain fluid we increased her lasix dose to 80mg  BID now her swelling has resolved but continues to have some SOB/Chest discomfort, pt also notes that her Imdur was discontinued during her last hospitalization and since then her SOB/Angina has been getting worse. She has dropped her weight from 180 last week to 175 now.  She has coronary artery disease s/p cabg and pci.    They would like f/u in [place] next year and will arrange with Dr Diona Browner.  Current Medications (verified): 1)  Cymbalta 60 Mg Cpep (Duloxetine Hcl) .... Once Daily 2)  Toprol Xl 100 Mg Xr24h-Tab (Metoprolol Succinate) .... Take One Tablet Once Daily 3)  Vytorin 10-40 Mg Tabs (Ezetimibe-Simvastatin) .... Take One Tablet By Mouth Dailyat Bedtime 4)  Lasix 40 Mg Tabs (Furosemide) .... 2 Tabs Two Times A Day 5)  Xanax 0.5 Mg Tabs (Alprazolam) .... As Needed 6)  Zyrtec Allergy 10 Mg Caps (Cetirizine Hcl) .Marland Kitchen.. 1 Tab Once Daily 7)  Albuterol Inhaler .... As Directed 8)  Lantus 100 Unit/ml Soln (Insulin Glargine) .... Sliding Scle 9)  Aerochamber Plus  Misc (Spacer/aero-Holding Chambers) .... Use As Directed 10)  Prilosec 20 Mg Cpdr (Omeprazole) .Marland Kitchen.. 1 Tab By Mouth Two Times A Day 11)  Pentazocine-Apap 25-650 Mg Tabs (Pentazocine-Apap) .... 2 Tabs As Needed For Pain 12)  Oxygen 3 Liters .... Daily 13)  Zofran 4 Mg Tabs (Ondansetron Hcl) .... As Needed 14)  Isosorbide Mononitrate Cr 30 Mg Xr24h-Tab (Isosorbide  Mononitrate) .... Take One Tablet By Mouth Daily  Allergies (verified): 1)  ! Sulfa 2)  ! Codeine 3)  ! Niacin 4)  ! 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) ENCOUNTER FOR LONG-TERM USE OF OTHER MEDICATIONS (ICD-V58.69) UNSPECIFIED DISORDER OF KIDNEY AND URETER (ICD-593.9) ACUTE ON CHRONIC DIASTOLIC HEART FAILURE (ICD-428.33)    - RHC 07/21/09 PA 110/31 mean 62 with wedge 34 and CI  1.8/ PVR 8.5 woods units 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) RENAL INSUFFICIENCY (ICD-588.9)    - Creat 1.4 12/28/09 HYPERLIPIDEMIA-MIXED (ICD-272.4) DIASTOLIC HEART FAILURE, CHRONIC (ICD-428.32) CAD, AUTOLOGOUS BYPASS GRAFT (ICD-414.02)  Review of Systems       Negative except per HPI  Vital Signs:  Patient profile:   65 year old female Height:      60 inches Weight:      175 pounds Pulse rate:   69 / minute Pulse (ortho):   83 / minute BP sitting:   110 / 60  (left arm) Cuff size:   regular  Vitals Entered By: Burnett Kanaris, CNA (August 01, 2010 11:23 AM)  Physical Exam  General:  frail 65 year old lady with nasal prongs in place he is connected to oxygen. Head:  normocephalic and  atraumatic Neck:  no masses, thyromegaly, or abnormal cervical nodes Lungs:  reduced breath sounds, clear to auscultation Heart:  regular rate rhythm without murmur gallop Abdomen:  obese positive bowel sounds no succussion splash. Abdomen is soft and nontender without appreciable mass or organomegaly Msk:  no deformity or scoliosis noted with normal posture Extremities:  no edema Neurologic:   non-focal   Impression & Recommendations:  Problem # 1:  SHORTNESS OF BREATH (ICD-786.05) Assessment Improved BiVentricular failure. failed Revatio. Not sure that there is anything to add beyond O2 and her cardiac regimen which is being closely followed. She might benefit from cardiopulm rehab and weight loss, will also start IMDUR to help with anginal symptoms. and will see patient again for reevaluation in several weeks.   Her updated medication list for this problem includes:    Toprol Xl 100 Mg Xr24h-tab (Metoprolol succinate) .Marland Kitchen... Take one tablet once daily    Lasix 40 Mg Tabs (Furosemide) .Marland Kitchen... 2 tabs two times a day  Problem # 2:  CHF (ICD-428.0) Assessment: Improved Has chronic diastolic CHF.  Euvolemic today.  Cont current Rx of Lasix 80 two times a day and tell pt to call if her weight rises above 179, if drops below 172 we informed the patient to reduce her lasix to 40mg  two times a day.   Her updated medication list for this problem includes:    Toprol Xl 100 Mg Xr24h-tab (Metoprolol succinate) .Marland Kitchen... Take one tablet once daily    Lasix 40 Mg Tabs (Furosemide) .Marland Kitchen... 2 tabs two times a day    Isosorbide Mononitrate Cr 30 Mg Xr24h-tab (Isosorbide mononitrate) .Marland Kitchen... Take one tablet by mouth daily  Problem # 3:  HYPERLIPIDEMIA-MIXED (ICD-272.4) Assessment: Comment Only SOB may be somewhat related to high dose of simvastatin, will reduce simvastatin to 40mg  from 80mg  given that her LDL was 50 recently.   Her updated medication list for this problem includes:    Vytorin 10-40 Mg Tabs (Ezetimibe-simvastatin) .Marland Kitchen... Take one tablet by mouth dailyat bedtime  Problem # 4:  CHEST PAIN (ICD-786.50) Assessment: Comment Only lasts only for a few seconds and come on with movement and has been getting worse since her IMDUR was d/c'd during last hospitalization.  will restart IMDUR today.   Her updated medication list for this problem includes:    Toprol Xl 100 Mg Xr24h-tab (Metoprolol  succinate) .Marland Kitchen... Take one tablet once daily    Isosorbide Mononitrate Cr 30 Mg Xr24h-tab (Isosorbide mononitrate) .Marland Kitchen... Take one tablet by mouth daily  Other Orders: TLB-BMP (Basic Metabolic Panel-BMET) (80048-METABOL)  Patient Instructions: 1)  Start Imdur (isosorbide) 30mg  once daily. 2)  Decrease Vytorin to 10/40mg  once daily. 3)  Labwork today: bmet (428.0) 4)  Call if your weight goes to 179 lbs. or above. 5)  If your weight goes to 172 lbs. or below, decrease lasix to 80mg  in the morning and 40mg  in the evening. 6)  Please call Dr. Kavin Leech office to reschedule your followup appointment with him. 7)  Your physician recommends that you schedule a follow-up appointment in: December 2011. Prescriptions: VYTORIN 10-40 MG TABS (EZETIMIBE-SIMVASTATIN) Take one tablet by mouth dailyat bedtime  #30 x 6   Entered by:   Sherri Rad, RN, BSN   Authorized by:   Lenoria Farrier, MD, [facility]   Signed by:   Sherri Rad, RN, BSN on 08/01/2010   Method used:   Electronically to        [facility] [address] (retail)  [address]       [place], [place]  [address]       Ph: [phone] or [phone]       Fax: [phone]   RxID:   [id number] ISOSORBIDE MONONITRATE CR 30 MG XR24H-TAB (ISOSORBIDE MONONITRATE) Take one tablet by mouth daily  #30 x 6   Entered by:   Sherri Rad, RN, BSN   Authorized by:   Lenoria Farrier, MD, [facility]   Signed by:   Sherri Rad, RN, BSN on 08/01/2010   Method used:   Electronically to        [facility] [address] (retail)       [address]       [place], [place]  [address]       Ph: [phone] or [phone]       Fax: [phone]   RxID:   [id number]

## 2010-11-23 NOTE — Progress Notes (Signed)
Summary: F/U on symptoms   Phone Note Call from Patient   Caller: Patient/ Spouse Summary of Call: I had left a message for the pt to call me back today regarding her weights and symtoms.  6/24- 196 lbs 6/25- 195 lbs 6/26- 197 lbs 6/27- 197 lbs 6/28- 198 lbs The pt's O2 sats on 3 L of continuous oxygen while she is on the phone with me is between 89%- 93%.  I will review this with Dr. Juanda Chance and call the pt and her husband back. Initial call taken by: Sherri Rad, RN, BSN,  April 18, 2010 4:36 PM Call placed by: Sherri Rad, RN, BSN,  April 18, 2010 4:34 PM  Follow-up for Phone Call        I discussed the above with Dr. Juanda Chance- orders received to have the pt take lasix 80mg  two times a day until her weight reaches 190 lbs. We will recheck a bmet in one week. I will mail an order to the pt to have this checked in [place]. She has been instructed to call back should she notice that her weight is not trending down over the next few days. The pt and he husband verbalize understanding of the medication changes and need for repeat labs in one week. Follow-up by: Sherri Rad, RN, BSN,  April 18, 2010 5:59 PM  Additional Follow-up for Phone Call Additional follow up Details #1::        still retaining fluid, request call back, Migdalia Dk  April 20, 2010 8:16 AM  Spoke with pt.Pt states she still retaining fluids after starting taken Lasix 80 mg twice a day. She weights 199 lbs today one pound more than 04/18/10.  Pt. also states she is take a lot of fluids as recommended and not voiding as much. Pt. is drinking about  60 oz or more of fluids a day. As 9:00 AM today pt. has taken 24 oz of fluids. I adviced pt. to cut down the fluid intake in half, and continue taken Lasix 80 mg twice a day.  She can call us back to see it it works. Ollen Gross, RN, BSN  April 20, 2010 9:21 AM

## 2010-11-23 NOTE — Progress Notes (Signed)
Summary: REQUEST CALL REGARDING PT WEIGHT   Phone Note Call from Patient Call back at Home Phone [phone]   Caller: Spouse/LARRY Summary of Call: REQUEST CALL REGARDING THE PT GAIN WEIGHT Initial call taken by: Judie Grieve,  December 23, 2009 3:13 PM  Follow-up for Phone Call        on way home on Wed, her oxygen tank ran out and when she got home and out of the car pt passed out, was only out a couple of sec., wt up from 195 1st of week to  200lbs today, cont. to have lower edema, husband states he can tell her face is more puffy than it has been,  she is currently taking lasix 40mg  2 tabs in am, he feels she has only been to the bathroom 1 time today, he states she has also been so tired she has been sleeping all the time, will discuss w/Dr Cranston Neighbor, RN  December 23, 2009 5:57 PM   I discussed the above with Dr. Juanda Chance- per Dr. Juanda Chance- he wants the pt to continue her sliding scale lasix as previously instructed at her last office visit with him. He asks that they not focus so much on some puffiness in the face and how many times the pt voids. I have explained this to the pt's husband. He is concerned about how sleepy the pt is also. I explained this may be from a combination of her heart, pulmonary status, and her diabetes. The pt's husband verbalizes understanding. Follow-up by: Sherri Rad, RN, BSN,  December 23, 2009 7:16 PM

## 2010-11-23 NOTE — Medication Information (Signed)
Summary: Physician's Orders   Physician's Orders   Imported By: Roderic Ovens 08/30/2010 10:25:51  _____________________________________________________________________  External Attachment:    Type:   Image     Comment:   External Document

## 2010-11-23 NOTE — Letter (Signed)
Summary: EGD POSS ED ORDER  EGD POSS ED ORDER   Imported By: Ave Filter 06/27/2010 11:46:27  _____________________________________________________________________  External Attachment:    Type:   Image     Comment:   External Document

## 2010-11-23 NOTE — Progress Notes (Signed)
Summary: med interaction  Phone Note From Other Clinic Call back at [phone]   Caller: [facility] Call For: Meagan [name] Summary of Call: level 1 medication interaction  is isosorbide and it interacts with revatio Initial call taken by: Rickard Patience,  December 14, 2009 10:14 AM  Follow-up for Phone Call        Pt was started back on isosorbide while in the hospital.  Pharmacist wants RB to be aware that there is a level 1 interaction between this med and revatio.  She states that she is unsure of what sort of reaction this could cause.  She states that pt is doing okay right now, not having any problems.  Will forward to doc of the day.  Please advise, thanks  Follow-up by: Vernie Murders,  December 14, 2009 10:38 AM  Additional Follow-up for Phone Call Additional follow up Details #1::        she needs to stop revatio while taking imdur/nitrates until it can be discussed further with Dr. Delton Coombes.  please forward string to Dr. Delton Coombes after you let pt konw. Additional Follow-up by: Barbaraann Share MD,  December 14, 2009 5:13 PM    Additional Follow-up for Phone Call Additional follow up Details #2::    [facility] is closed.  will keep msg in triage to call tomorrow. Boone Master CNA  December 14, 2009 5:26 PM   LMTCB. Carron Curie CMA  December 15, 2009 9:10 AM  I spoke with Meagan [name] and she was given verbal order for the pt to stop the Revatio, per Dr. Shelle Iron, while she is taking Imdur/nitrates. Meagan [name] will notify the pt of this and let her know that Dr. Kavin Leech office will call her once he has reviewed this and let her know what she is to do regarding her PAH therapy. Michel Bickers [facility]  December 15, 2009 10:11 AM  OK thanks Leslye Peer MD  December 20, 2009 4:19 PM   Follow-up by: Leslye Peer MD,  December 20, 2009 4:19 PM

## 2010-11-23 NOTE — Miscellaneous (Signed)
Summary: Face to Face Encounter Note   Face to Face Encounter Note   Imported By: Roderic Ovens 06/09/2010 10:46:25  _____________________________________________________________________  External Attachment:    Type:   Image     Comment:   External Document

## 2010-11-23 NOTE — Medication Information (Signed)
Summary: Lab Orders  Lab Orders   Imported By: Marylou Mccoy 05/22/2010 16:20:00  _____________________________________________________________________  External Attachment:    Type:   Image     Comment:   External Document

## 2010-11-23 NOTE — Progress Notes (Signed)
Summary: needs bloodwork fax order asap  Phone Note Call from Patient   Caller: Patient Reason for Call: Talk to Nurse Summary of Call: [facility] the way [facility], as of right now they do not have the faxed order from us-can you please send asap-fax #[phone] Initial call taken by: Glynda Jaeger,  April 28, 2010 3:37 PM  Follow-up for Phone Call        Order for [facility] faxed to [phone] per the pt's request.  Follow-up by: Julieta Gutting, RN, BSN,  April 28, 2010 3:44 PM

## 2010-11-23 NOTE — Assessment & Plan Note (Signed)
Summary: f66m  Medications Added LYRICA 75 MG CAPS (PREGABALIN) 1 tab  three times a day PRILOSEC 20 MG CPDR (OMEPRAZOLE) 1 tab once daily METANX 3-35-2 MG TABS (L-METHYLFOLATE-B6-B12) 2 tab  once daily PENTAZOCINE-APAP 25-650 MG TABS (PENTAZOCINE-APAP) 2 tabs as needed for pain      Allergies Added:   Visit Type:  Follow-up Referring Provider:  Dr. Charlies Constable Primary Provider:  dr Sherwood Gambler   History of Present Illness: The patient is 65 years old and return for management of diastolic heart failure and CAD. She had bypass surgery in 1997 and in 2006 had PCI for a chronic total occlusion occlusion of the right coronary artery using a drug-eluting stent. Her major problem has been severe diastolic heart failure. She also has severe pulmonary hypertension with pulmonic systolic pressures of 100.  She has been doing fair recently. She has been having her weights monitored through [place] [facility] and her weights have generally been in the range of 191 295. She was 191 today. If her weight gets over 195 she takes 2.5 mg of Zaroxolyn with the 80 mg of Lasix that she takes daily.  She is scheduled to see Dr. Ortencia Kick within the next couple weeks for further management of her pulmonary hypertension. He has not decided yet whether to put her on pulmonary vasodilators.  She saw Dr. Sherwood Gambler last week and he put her on Metanex next and increased her Lyrica because of neuropathy pain.  Current Medications (verified): 1)  Cymbalta 60 Mg Cpep (Duloxetine Hcl) .... Once Daily 2)  Toprol Xl 100 Mg Xr24h-Tab (Metoprolol Succinate) .... Take One Tablet Once Daily 3)  Vytorin 10-80 Mg Tabs (Ezetimibe-Simvastatin) .... Take One Tablet Once Daily 4)  Lyrica 75 Mg Caps (Pregabalin) .Marland Kitchen.. 1 Tab  Three Times A Day 5)  Imdur 30 Mg Xr24h-Tab (Isosorbide Mononitrate) .... Take 1  Tablet Two Times A Day 6)  Diovan 40 Mg Tabs (Valsartan) .Marland Kitchen.. 1 By Mouth Daily 7)  Lasix 40 Mg Tabs (Furosemide) .... Take Two Daily 8)   Actos 15 Mg Tabs (Pioglitazone Hcl) .... Once Daily 9)  Xanax 0.5 Mg Tabs (Alprazolam) .... As Needed 10)  Zyrtec Allergy 10 Mg Caps (Cetirizine Hcl) .Marland Kitchen.. 1 Tab Once Daily 11)  Albuterol Inhaler .... As Directed 12)  Insulin (Humalog Mix 75/25 Pen) .... 2 Shots Daily 13)  Aspirin 81 Mg Tbec (Aspirin) .... Take One Tablet By Mouth Daily 14)  Potassium Chloride Crys Cr 20 Meq Cr-Tabs (Potassium Chloride Crys Cr) .... Take One Tablet By Mouth Once Daily 15)  Aerochamber Plus  Misc (Spacer/aero-Holding Chambers) .... Use As Directed 16)  Metolazone 2.5 Mg Tabs (Metolazone) .... Take As Needed 17)  Prilosec 20 Mg Cpdr (Omeprazole) .Marland Kitchen.. 1 Tab Once Daily 18)  Metanx 3-35-2 Mg Tabs (L-Methylfolate-B6-B12) .... 2 Tab  Once Daily 19)  Pentazocine-Apap 25-650 Mg Tabs (Pentazocine-Apap) .... 2 Tabs As Needed For Pain  Allergies (verified): 1)  ! Sulfa 2)  ! Codeine 3)  ! Niacin 4)  ! Jonne Ply  Past History:  Past Medical History: Reviewed history from 07/18/2009 and no changes required. Coronary artery disease status post coronary bypass grafting 1997, percutaneous intervention of the right coronary artery 2006 Fibromyalgia Diabetes mellitus Hypertension Hypercholesterolemia Diastolic CHF -EF 32% by echo 2010  Review of Systems       ROS is negative except as outlined in HPI.   Vital Signs:  Patient profile:   65 year old female Height:      60  inches Weight:      193 pounds Pulse rate:   60 / minute BP sitting:   96 / 56  (left arm) Cuff size:   large  Vitals Entered By: Burnett Kanaris, CNA (November 16, 2009 4:24 PM)  Physical Exam  Additional Exam:  Gen. Well-nourished, in no distress   Neck: JVD 2 cm above the clavicle, thyroid not enlarged, no carotid bruits Lungs: No tachypnea, clear without rales, rhonchi or wheezes Cardiovascular: Rhythm regular, PMI not displaced,  heart sounds  normal, no murmurs or gallops, no peripheral edema, pulses normal in all 4 extremities. Abdomen:  BS normal, abdomen soft and non-tender without masses or organomegaly, no hepatosplenomegaly. MS: No deformities, no cyanosis or clubbing   Neuro:  No focal sns   Skin:  no lesions    Impression & Recommendations:  Problem # 1:  ACUTE ON CHRONIC DIASTOLIC HEART FAILURE (ICD-428.33) She does have JVD but otherwise appears euvolemic and fairly well compensated. We will continue her current therapy. I think her weight today which is 191 on her home scales represents her ideal weight. We will recommend that she continue Lasix 80 mg daily but her weight gets above 195 that she should take Zaroxolyn 2.5 mg with her Lasix.we will get a BMP today. Her updated medication list for this problem includes:     Toprol Xl 100 Mg Xr24h-tab (Metoprolol succinate) .Marland Kitchen... Take one tablet once daily    Imdur 30 Mg Xr24h-tab (Isosorbide mononitrate) .Marland Kitchen... Take 1  tablet two times a day    Diovan 40 Mg Tabs (Valsartan) .Marland Kitchen... 1 by mouth daily    Lasix 40 Mg Tabs (Furosemide) .Marland Kitchen... Take two daily    Aspirin 81 Mg Tbec (Aspirin) .Marland Kitchen... Take one tablet by mouth daily    Metolazone 2.5 Mg Tabs (Metolazone) .Marland Kitchen... Take as needed  Her updated medication list for this problem includes:    Toprol Xl 100 Mg Xr24h-tab (Metoprolol succinate) .Marland Kitchen... Take one tablet once daily    Imdur 30 Mg Xr24h-tab (Isosorbide mononitrate) .Marland Kitchen... Take 1  tablet two times a day    Diovan 40 Mg Tabs (Valsartan) .Marland Kitchen... 1 by mouth daily    Lasix 40 Mg Tabs (Furosemide) .Marland Kitchen... Take two daily    Aspirin 81 Mg Tbec (Aspirin) .Marland Kitchen... Take one tablet by mouth daily    Metolazone 2.5 Mg Tabs (Metolazone) .Marland Kitchen... Take as needed  Problem # 2:  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) WE checked today oxygen saturation a day and it was 91-95. We will leave her on 3 L of oxygen until she sees Dr. Ortencia Kick. Dr. Leanne Lovely a decision about pulmonary vasodilators.  Problem # 3:  CAD, AUTOLOGOUS BYPASS GRAFT (ICD-414.02) She has had previous bypass surgery and PCI as  described above. She's had no chest pain and this problem appears stable. Her updated medication list for this problem includes:    Toprol Xl 100 Mg Xr24h-tab (Metoprolol succinate) .Marland Kitchen... Take one tablet once daily    Imdur 30 Mg Xr24h-tab (Isosorbide mononitrate) .Marland Kitchen... Take 1  tablet two times a day    Aspirin 81 Mg Tbec (Aspirin) .Marland Kitchen... Take one tablet by mouth daily  Orders: TLB-BMP (Basic Metabolic Panel-BMET) (80048-METABOL) TLB-BNP (B-Natriuretic Peptide) (83880-BNPR)  Patient Instructions: 1)  Your physician recommends that you have lab work today: bmet/ bnp (428.0) 2)  Your baseline weight is 191lbs. If your weight is 195 or greater, take metolazone 2.5mg  prior to your morning lasix. 3)  Continue on 3L of oxygen until you  see Dr. Delton Coombes. 4)  Your physician recommends that you schedule a follow-up appointment in: 3 months.

## 2010-11-23 NOTE — Medication Information (Signed)
Summary: Denied/RSVP [employer]  Denied/RSVP [employer]   Imported By: Lester [place] 12/15/2009 08:28:15  _____________________________________________________________________  External Attachment:    Type:   Image     Comment:   External Document

## 2010-11-23 NOTE — Progress Notes (Signed)
Summary: F/U on weight   Phone Note Outgoing Call   Call placed by: Sherri Rad, RN, BSN,  April 25, 2010 8:39 AM Summary of Call: I called the pt today to f/u on her weights. She states she dropped 4 lbs on saturday after taking her zaroxalyn on friday- she was 196 lbs. On sunday she was 197, monday she was 199, and today she was 198. She has taken her zaroxalyn this morning and is waiting her 30 min. to take her lasix. She is going to the lab in [place] today to have her bmet checked. I will call them later this afternoon and call her back when I have her results. She is agreeable. She also states she fell this morning after she let her dog outside. She tripped coming in the back door.  Initial call taken by: Sherri Rad, RN, BSN,  April 25, 2010 8:43 AM  Follow-up for Phone Call        I have addressed the pt's labs with Dr. Juanda Chance  Follow-up by: Sherri Rad, RN, BSN,  April 25, 2010 5:06 PM

## 2010-11-23 NOTE — Assessment & Plan Note (Signed)
Summary: eph   Visit Type:  Follow-up Referring [place] E[place]ard:  Dr. Charlies Constable Primary Shakena Callari:  dr Sherwood Gambler  CC:  nausea.  History of Present Illness: Patient is 65 years old and return for a followup visit after her recent hospitalization for respiratory failure. She has obesity hypoventilation syndrome, diastolic CHF, pulmonary hypertension, and CAD. She has had previous bypass surgery and previous PCI of a chronic total occlusion of the right coronary. She has good LV systolic function.  She was recently admitted with inspiratory failure and renal failure after we attempted to control her CHF as an outpatient. She required ventilation support but finally improved. She is done better since discharge. Her weight is down 20 pounds from her prehospitalization weight. Her oxygen saturations have been pretty good and her over 90% except when she exercises.  Her chief complaint is nausea. She has no associated diarrhea or abdominal pain.  Current Medications (verified): 1)  Cymbalta 60 Mg Cpep (Duloxetine Hcl) .... Once Daily 2)  Toprol Xl 100 Mg Xr24h-Tab (Metoprolol Succinate) .... Take One Tablet Once Daily 3)  Vytorin 10-80 Mg Tabs (Ezetimibe-Simvastatin) .... Take One Tablet Once Daily 4)  Lasix 40 Mg Tabs (Furosemide) .Marland Kitchen.. 1 Tab  By Mouth Two Times A Day 5)  Xanax 0.5 Mg Tabs (Alprazolam) .... As Needed 6)  Zyrtec Allergy 10 Mg Caps (Cetirizine Hcl) .Marland Kitchen.. 1 Tab Once Daily 7)  Albuterol Inhaler .... As Directed 8)  Insulin (Humalog Mix 75/25 Pen) .... 2 Shots Daily 9)  Potassium Chloride Crys Cr 20 Meq Cr-Tabs (Potassium Chloride Crys Cr) .... Take One Tablet By Mouth Two Times A Day 10)  Aerochamber Plus  Misc (Spacer/aero-Holding Chambers) .... Use As Directed 11)  Prilosec 20 Mg Cpdr (Omeprazole) .Marland Kitchen.. 1 Tab Once Daily 12)  Pentazocine-Apap 25-650 Mg Tabs (Pentazocine-Apap) .... 2 Tabs As Needed For Pain 13)  Oxygen 3 Liters .... Daily 14)  Spironolactone 25 Mg Tabs (Spironolactone) ....  Take One Tablet By Mouth Bid  Allergies (verified): 1)  ! Sulfa 2)  ! Codeine 3)  ! Niacin 4)  ! Jonne Ply  Past History:  Past Medical History: Reviewed history from 05/10/2010 and no changes required. 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   Vital Signs:  Patient profile:   65 year old female Height:      60 inches Weight:      172 pounds BMI:     33.71 Pulse rate:   68 / minute BP sitting:   140 / 70  (left arm)  Vitals Entered By: Burnett Kanaris, CNA (June 07, 2010 3:45 PM)  Physical Exam  Additional Exam:  Gen. Well-nourished, in no distress   Neck: No JVD, thyroid not enlarged, no carotid bruits Lungs: No tachypnea, clear without rales,  rhonchi or wheezes Cardiovascular: Rhythm regular, PMI not displaced,  heart sounds  normal, no murmurs or gallops, no peripheral edema, pulses normal in all 4 extremities. Abdomen: BS normal, abdomen soft and non-tender without masses or organomegaly, no hepatosplenomegaly. MS: No deformities, no cyanosis or clubbing   Neuro:  No focal sns   Skin:  no lesions    Impression  & Recommendations:  Problem # 1:  RESPIRATORY FAILURE, ACUTE (ICD-518.81) She has recent respire try failure related to obesity hypoventilation syndrome and diastolic CHF. She is now much better. Her oxygen saturations have been over 90% for the most part. I think a lot of her improvement is related to her 20 pound weight loss.  Problem # 2:  CHF (ICD-428.0) She has diastolic CHF. She appears euvolemic today. She had a creatinine last week which was 1.76. We will continue her current diuretic regimen. The following medications were removed from the medication list:    Imdur 30 Mg Xr24h-tab (Isosorbide mononitrate) .Marland Kitchen... Take 1  tablet two times a day    Diovan 160 Mg Tabs (Valsartan) .Marland Kitchen... Take 1 tablet by mouth daily    Aspirin 81 Mg Tbec (Aspirin) .Marland Kitchen... Take one tablet by mouth daily    Zaroxolyn 2.5 Mg Tabs (Metolazone) .Marland Kitchen... Take 1 tablet daily while weight is over 190 pounds.  when weight is 190 or less do not take zaroxolyn Her updated medication list for this problem includes:    Toprol Xl 100 Mg Xr24h-tab (Metoprolol succinate) .Marland Kitchen... Take one tablet once daily    Lasix 40 Mg Tabs (Furosemide) .Marland Kitchen... 1 tab  by mouth two times a day    Spironolactone 25 Mg Tabs (Spironolactone) .Marland Kitchen... Take one tablet by mouth bid  Problem # 3:  NAUSEA (ICD-787.02)  She has persistent nausea since her hospitalization. There is no associated diarrhea or abdominal pain. Her exam is unremarkable. She has never had any problems with her gallbladder or stomach in the past. We will plan to get blood tests today including a CBC liver function test amylase and lipase. We will arrange for her to have an ultrasound of her gallbladder liver and pancreas. She is scheduled to see Dr. Sherwood Gambler next week and we'll ask him if he would be willing to followup on these studies and decide about further evaluation of her nausea.  Orders: TLB-BMP (Basic Metabolic Panel-BMET) (80048-METABOL) TLB-CBC Platelet - w/Differential  (85025-CBCD) TLB-Hepatic/Liver Function Pnl (80076-HEPATIC) TLB-Amylase (82150-AMYL) TLB-Lipase (83690-LIPASE) Radiology Referral (Radiology)  Problem # 4:  CAD, AUTOLOGOUS BYPASS GRAFT (ICD-414.02) She has had previous bypass surgery and previous PCI procedures. She has had no chest pain and his palm appears stable. The following medications were removed from the medication list:    Imdur 30 Mg Xr24h-tab (Isosorbide mononitrate) .Marland Kitchen... Take 1  tablet two times a day    Aspirin 81 Mg Tbec (Aspirin) .Marland Kitchen... Take one tablet by mouth daily Her updated medication list for this problem includes:    Toprol Xl 100 Mg Xr24h-tab (Metoprolol succinate) .Marland Kitchen... Take one tablet once daily  Patient Instructions: 1)  Your physician recommends that you schedule a follow-up appointment in: 4 weeks. 2)  Your physician recommends that you continue on your current medications as directed. Please refer to the Current Medication list given to you today. 3)  Your physician recommends that you have lab work today: liver/cbc/bmet/amylase/lipase (414.01;428.33) 4)  You are being scheduled for an ultrasound study of the liver, pancreas, and gallbladder at [facility].

## 2010-11-23 NOTE — Assessment & Plan Note (Signed)
Summary: diastolic dysfxn, pulm HTN   Visit Type:  Follow-up Copy to:  Dr. Charlies Constable Primary Provider/Referring Provider:  dr Sherwood Gambler  CC:  PAH.  The patient says her breathing is better but her spouse disagrees and says her breathing is worse with any exertion.Marland Kitchen  History of Present Illness: 65 yo woman, never smoker, with HTN, CAD/CABG, DM, diastolic dysfxn with several hospitalizations for volume overload and dyspnea over the last yr. She had cardiac cath 07/21/09 that showed CAD w patent graft, severe PAH (mean PAP > 60) with severely elevated wedge pressure. Her diuretics have been adjusted with significant improvement in her fluid status and her breathing. She has had PFT that show predominantly restriction, with possible coexisting mild obstruction. She was started on albuterol and she believes it does help some. 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   ROV 10/12/09 -- returns for f/u. walking oximetry shows desaturation, overnight oximetry without signifivcant desat, PSG without OSA. Has tried albuterol but is sure that she isn't taking it correctly - needs a spacer.   ROV 11/21/09 -- F/u today for her pulm HTN in setting LV dysfxn as detailed above, probably out of preportion to her known CHF. Tells me that she began to get URI symptoms about a week ago. Still with nasal congestion, dry cough, feels drained.   ROV/hosp f/u 12/20/09 -- Was discharged from hosp last Monday after diuresis. I restarted her Revatio in the hospital, note that she is also on scheduled Imdur. Adjustments made in diuretics 4 days ago by Dr Juanda Chance. She has put on 3 lbs since last week, tell me that she is feeling more dyspnea, orthopnea. She has also heard some wheeze at night when she lays flat. She can't tell that the Revatio has helped her breathing at all. She has not uses SABA frequently, doesn't feel it helps her much.   ROV 02/16/10 -- f/u for HTN, diastolic  dysfxn and severe pulm HTN. She is on a stable diuretic regimen, wt has been stable per her scales at home. Some LE edema. Discussed restarting Revatio with her today.   Current Medications (verified): 1)  Cymbalta 60 Mg Cpep (Duloxetine Hcl) .... Once Daily 2)  Toprol Xl 100 Mg Xr24h-Tab (Metoprolol Succinate) .... Take One Tablet Once Daily 3)  Vytorin 10-80 Mg Tabs (Ezetimibe-Simvastatin) .... Take One Tablet Once Daily 4)  Lyrica 75 Mg Caps (Pregabalin) .Marland Kitchen.. 1 Tab  Three Times A Day 5)  Imdur 30 Mg Xr24h-Tab (Isosorbide Mononitrate) .... Take 1  Tablet Two Times A Day 6)  Diovan 160 Mg Tabs (Valsartan) .... Take 1 Tablet By Mouth Daily 7)  Lasix 40 Mg Tabs (Furosemide) .Marland Kitchen.. 1 Tab Two Times A Day 8)  Xanax 0.5 Mg Tabs (Alprazolam) .... As Needed 9)  Zyrtec Allergy 10 Mg Caps (Cetirizine Hcl) .Marland Kitchen.. 1 Tab Once Daily 10)  Albuterol Inhaler .... As Directed 11)  Insulin (Humalog Mix 75/25 Pen) .... 2 Shots Daily 12)  Aspirin 81 Mg Tbec (Aspirin) .... Take One Tablet By Mouth Daily 13)  Potassium Chloride Crys Cr 20 Meq Cr-Tabs (Potassium Chloride Crys Cr) .... Take One Tablet By Mouth Once Daily 14)  Aerochamber Plus  Misc (Spacer/aero-Holding Chambers) .... Use As Directed 15)  Prilosec 20 Mg Cpdr (Omeprazole) .Marland Kitchen.. 1 Tab Once Daily 16)  Metanx 3-35-2 Mg Tabs (L-Methylfolate-B6-B12) .... 2 Tab  Once Daily 17)  Pentazocine-Apap 25-650 Mg Tabs (Pentazocine-Apap) .... 2 Tabs As  Needed For Pain 18)  Oxigen 3 Litters .... Daily  Allergies (verified): 1)  ! Sulfa 2)  ! Codeine 3)  ! Niacin 4)  ! Asa  Vital Signs:  Patient profile:   65 year old female Height:      60 inches (152.40 cm) Weight:      199 pounds (90.45 kg) BMI:     39.01 O2 Sat:      84 % on 3 L pulsed Temp:     98.0 degrees F (36.67 degrees C) oral Pulse rate:   64 / minute BP sitting:   106 / 58  (right arm) Cuff size:   large  Vitals Entered By: Michel Bickers CMA (February 16, 2010 4:39 PM)  O2 Sat at Rest %:  84 O2  Flow:  3 L pulsed  O2 Sat Comments Patients sats were 84% on 3L pulsed oxygen upon entering the exam room. After resting and taking deep breaths her sats came up to 90% on the 3L pulsed oxygen. Michel Bickers [facility]  February 16, 2010 4:43 PM  Physical Exam  General:  debilitated obese woman in whelchair.   Head:  normocephalic and atraumatic Nose:  no deformity, discharge, inflammation, or lesions Mouth:  no deformity or lesions Neck:  no masses, thyromegaly, or abnormal cervical nodes Lungs:  small volumes, few basilar insp crackles that clear with deep insp, no wheezing Heart:  distant, regular, no M Abdomen:  obese, benign Msk:  no deformity or scoliosis noted with normal posture Extremities:  trace edema Neurologic:  non-focal Skin:  bruising on UE's Psych:  alert and cooperative; normal mood and affect; normal attention span and concentration   Impression & Recommendations:  Problem # 1:  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)  Severe L and R sided disease. Discussed with the pt and with Dr Juanda Chance. Favor retrying the Revatio at this time. She did have a decompensation and fluid overload after initiating earlier this year, but not clear that this was related to the med.  - Revatio 20mg  three times a day, she already has a supply - ROV in 1 month to insure she tolerates.   Orders: Est. Patient Level IV (98119)  Medications Added to Medication List This Visit: 1)  Diovan 160 Mg Tabs (Valsartan) .... Take 1 tablet by mouth daily  Patient Instructions: 1)  Please continue your medications as directed by Dr Juanda Chance. 2)  Continue your oxygen at all times.  3)  We will restart Revatio 20mg  three times a day. Please call our office if you have headache, lightheadedness or any new symptoms on this medication.  4)  Follow up with Dr Delton Coombes in 1 month or as needed    Immunization History:  Influenza Immunization History:    Influenza:  historical (07/22/2009)  Pneumovax Immunization  History:    Pneumovax:  historical (07/22/2009)

## 2010-11-23 NOTE — Progress Notes (Signed)
Summary: test results  Phone Note Call from Patient Call back at Home Phone [phone]   Caller: Patient Reason for Call: Lab or Test Results Initial call taken by: Judie Grieve,  August 14, 2010 1:53 PM  Follow-up for Phone Call        I spoke with the pt. Follow-up by: Sherri Rad, RN, BSN,  August 14, 2010 2:40 PM

## 2010-11-23 NOTE — Letter (Signed)
Summary: egd poss ed order  egd poss ed order   Imported By: Ave Filter 06/20/2010 16:34:58  _____________________________________________________________________  External Attachment:    Type:   Image     Comment:   External Document  Appended Document: egd poss ed order Pt is scheduled for 06/28/10@7 :00a.m.

## 2010-11-23 NOTE — Cardiovascular Report (Signed)
Summary: Heart Failure Program Status Report   Heart Failure Program Status Report   Imported By: Roderic Ovens 12/06/2009 15:08:27  _____________________________________________________________________  External Attachment:    Type:   Image     Comment:   External Document

## 2010-11-23 NOTE — Cardiovascular Report (Signed)
Summary: Heart Failure Program Status Report  Heart Failure Program Status Report   Imported By: Roderic Ovens 02/09/2010 10:50:25  _____________________________________________________________________  External Attachment:    Type:   Image     Comment:   External Document

## 2010-11-23 NOTE — Medication Information (Signed)
Summary: Lab Orders  Lab Orders   Imported By: Marylou Mccoy 08/24/2010 13:56:51  _____________________________________________________________________  External Attachment:    Type:   Image     Comment:   External Document

## 2010-11-23 NOTE — Letter (Signed)
Summary: ER Notification  Architectural technologist, Main Office  [address]   [place], [place] [address]   Phone: [phone]  Fax: [phone]    May 10, 2010 6:34 PM  Meagan [name]  The above referenced patient has been advised to report directly to the Emergency Room. Please see below for more information:  Dx: -diastolic heart failure/pulmonary HTN- labs done at PCP on 7/19 late- results received show K+ 5.9/ BUN- 106/ Creat- 5.14. The pt is somewhat lethargic at home now. Her weights have been up as of late, but have recently returned to baseline over the last few days.    Private Vehicle  _______________ or EMS:  _______x_________   Orders:  Yes ______ or No  __x_____   Notify upon arrival:     Lenard Galloway- [facility] @ [phone]         Thank you,    Architectural technologist Staff

## 2010-11-23 NOTE — Miscellaneous (Signed)
Summary: Home Health Certification/Treatment Plan  Home Health Certification/Treatment Plan   Imported By: Roderic Ovens 06/09/2010 10:44:19  _____________________________________________________________________  External Attachment:    Type:   Image     Comment:   External Document

## 2010-11-23 NOTE — Miscellaneous (Signed)
  Clinical Lists Changes  Observations: Added new observation of ECHOINTERP:  Study Conclusions    - Left ventricle: The cavity size was normal. Systolic function was     normal. The estimated ejection fraction was in the range of 60% to     65%.   - Right ventricle: The cavity size was moderately dilated. Systolic     function was mildly reduced.   - Right atrium: The atrium was mildly dilated.   - Pulmonary arteries: Systolic pressure was moderately to severely     increased. PA peak pressure: 75mm Hg (S).   Impressions:    - Technically very limited study due to poor sound wave     transmission. RV appears moderately dialted with mild hypokinesis     but not seen well. PA pressure moderately to severely elevated.     Consider further evaluation with right heart catheterization or     cardiac MRI. Consider work up for pulmonary embolus if clinical     scenario consistent with this.   Transthoracic echocardiography. M-mode, complete 2D, spectral   Doppler, and color Doppler. Height: Height: 150cm. Height: 59.1in.   Weight: Weight: 94kg. Weight: 206.8lb. Body mass index: BMI:   41.8kg/m^2. Body surface area: BSA: 2.104m^2. Blood pressure: 109/90.   Patient status: Inpatient.    --------------------------------------------------------------------  Prepared and Electronically Authenticated by    Nicholes Mango, MD   2011-02-14T15:[id number]  (12/05/2009 10:21)      Echocardiogram  Procedure date:  12/05/2009  Findings:       Study Conclusions    - Left ventricle: The cavity size was normal. Systolic function was     normal. The estimated ejection fraction was in the range of 60% to     65%.   - Right ventricle: The cavity size was moderately dilated. Systolic     function was mildly reduced.   - Right atrium: The atrium was mildly dilated.   - Pulmonary arteries: Systolic pressure was moderately to severely     increased. PA peak pressure: 75mm Hg (S).    Impressions:    - Technically very limited study due to poor sound wave     transmission. RV appears moderately dialted with mild hypokinesis     but not seen well. PA pressure moderately to severely elevated.     Consider further evaluation with right heart catheterization or     cardiac MRI. Consider work up for pulmonary embolus if clinical     scenario consistent with this.   Transthoracic echocardiography. M-mode, complete 2D, spectral   Doppler, and color Doppler. Height: Height: 150cm. Height: 59.1in.   Weight: Weight: 94kg. Weight: 206.8lb. Body mass index: BMI:   41.8kg/m^2. Body surface area: BSA: 2.65m^2. Blood pressure: 109/90.   Patient status: Inpatient.    --------------------------------------------------------------------  Prepared and Electronically Authenticated by    Nicholes Mango, MD   [id number]

## 2010-11-23 NOTE — Miscellaneous (Signed)
Summary: [facility]   [facility] Note   Imported By: Roderic Ovens 05/31/2010 11:57:37  _____________________________________________________________________  External Attachment:    Type:   Image     Comment:   External Document

## 2010-11-23 NOTE — Assessment & Plan Note (Signed)
Summary: per check out   Referring Provider:  Sherwood Gambler Primary Provider:  Sherwood Gambler  CC:  swelling in legs and sob.  History of Present Illness: Meagan [name] is a 65 and return for management of her CHF and smooth. She has obesity-hyperventilation-syndrome with associated to hypertension and chronic hypoxia requiring home O2.  she was hospitalized a month ago with his doctor if requiring intubation. Since that time she has done remarkably well. She has dropped her wieght from 190 to 170 althought it is up to 178 at home now.  Her breathing is better.  She has coronary artery disease s/p cabg and pci.  no recent cp.  They would like f/u in [place] next year and will arrange with Dr Diona Browner.  Current Medications (verified): 1)  Cymbalta 60 Mg Cpep (Duloxetine Hcl) .... Once Daily 2)  Toprol Xl 100 Mg Xr24h-Tab (Metoprolol Succinate) .... Take One Tablet Once Daily 3)  Vytorin 10-80 Mg Tabs (Ezetimibe-Simvastatin) .... Take One Tablet Once Daily 4)  Lasix 40 Mg Tabs (Furosemide) .Marland Kitchen.. 1 Tab  By Mouth Two Times A Day 5)  Xanax 0.5 Mg Tabs (Alprazolam) .... As Needed 6)  Zyrtec Allergy 10 Mg Caps (Cetirizine Hcl) .Marland Kitchen.. 1 Tab Once Daily 7)  Albuterol Inhaler .... As Directed 8)  Lantus 100 Unit/ml Soln (Insulin Glargine) .... Sliding Scle 9)  Aerochamber Plus  Misc (Spacer/aero-Holding Chambers) .... Use As Directed 10)  Prilosec 20 Mg Cpdr (Omeprazole) .Marland Kitchen.. 1 Tab By Mouth Two Times A Day 11)  Pentazocine-Apap 25-650 Mg Tabs (Pentazocine-Apap) .... 2 Tabs As Needed For Pain 12)  Oxygen 3 Liters .... Daily 13)  Zofran 4 Mg Tabs (Ondansetron Hcl) .... As Needed  Allergies: 1)  ! Sulfa 2)  ! Codeine 3)  ! Niacin 4)  ! Jonne Ply  Past History:  Past Surgical History: Last updated: July 27, 2009 Status post hysterectomy CABG  Family History: Last updated: 07-27-09  Father died at 31 of myocardial infarction.  Mother died at 72 of oral cancer.  A brother is alive with heart disease and COPD  at 12.  A sister is alive and well at 7.  Another sister is alive at 71 withcoronary disease and hypertension.  Two brothers died in a motor vehicle accident.  Social History: Last updated: 08/23/2009 Married -No children Tobacco Use - No.  Alcohol Use - no Regular Exercise - yes-always very active Retired -- Clinical biochemist  Past Medical History: Reviewed history from 05/10/2010 and no changes required. 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   Vital Signs:  Patient profile:   65 year old female Height:      60 inches Weight:      180 pounds BMI:     35.28 Pulse rate:   76 /  minute Resp:     18 per minute BP sitting:   141 / 68  (left arm)  Vitals Entered By: Kem Parkinson (July 11, 2010 3:33 PM)  Physical Exam  Additional Exam:  Gen. Well-nourished, in no distress   Neck: JVP ? at clavicle, thyroid not enlarged, no carotid bruits Lungs: No tachypnea, clear without rales, rhonchi or wheezes Cardiovascular: Rhythm regular, PMI not displaced,  heart sounds  normal, no murmurs or gallops, no peripheral  edema, pulses normal in all 4 extremities. Abdomen: BS normal, abdomen soft and non-tender without masses or organomegaly, no hepatosplenomegaly. 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  She is doing better.  Has gained about 8# fat and encouraged her to lose.  Problem # 2:  CHF (ICD-428.0) Has chronic diastolic CHF.  Euvolemic today.  Cont current Rx. Her updated medication list for this problem includes:    Toprol Xl 100 Mg Xr24h-tab (Metoprolol succinate) .Marland Kitchen... Take one tablet once daily    Lasix 40 Mg Tabs (Furosemide) .Marland Kitchen... 1 tab  by mouth two times a day  Orders: TLB-BMP (Basic Metabolic Panel-BMET) (80048-METABOL) TLB-BNP (B-Natriuretic Peptide) (83880-BNPR) TLB-CBC Platelet - w/Differential (85025-CBCD)  Problem # 3:  CAD, AUTOLOGOUS BYPASS GRAFT (ICD-414.02) Had prior CABG and PCI. No cp.  Stable. Her updated medication list for this problem includes:    Toprol Xl 100 Mg Xr24h-tab (Metoprolol succinate) .Marland Kitchen... Take one tablet once daily  Patient Instructions: 1)  Your physician recommends that you schedule a follow-up appointment in: 2 months. 2)  Your physician recommends that you have lab work today: bmet/cbc/bnp (414.01;428.33) 3)  Your physician recommends that you continue on your current medications as directed. Please refer to the Current Medication list given to you today.

## 2010-11-23 NOTE — Cardiovascular Report (Signed)
Summary: Heart Failure Program Status Report  Heart Failure Program Status Report   Imported By: Roderic Ovens 12/06/2009 15:07:22  _____________________________________________________________________  External Attachment:    Type:   Image     Comment:   External Document

## 2010-11-23 NOTE — Assessment & Plan Note (Signed)
Summary: [facility]  Medications Added * OXIGEN 3 LITTERS daily        Visit Type:  2 weeks follow up Referring Provider:  Dr. Charlies Constable Primary Provider:  dr Sherwood Gambler  CC:  Sob.  History of Present Illness: This is a 65 year old white female patient who I saw in  the office last week for followup on acute on chronic diastolic heart failure. When she was in the office.,her oxygen tank stopped working and we converted her to ours.She left on the second tank that she had with her, but apparently this ran out, also. When she arrived home she had a syncopal episode getting into her house. Her friend that was with her placed her on oxygen, and she came around. She called her husband and did not proceed to the hospital. She had trouble over the weekend with decreased O2 sats down to 87%, and hallucinations. Home health told her she was not breathing through her nose rapidly and therefore, not receiving the oxygen. When she was taught to breathe properly, her sats came up to 95%. She was also taking extra  pain medications, which caused confusion.  Today she is feeling better and her edema is down. She has chronic dyspnea on exertion, secondary to her COPD. She has had no further syncope, dizziness, or chest pain.  Current Medications (verified): 1)  Cymbalta 60 Mg Cpep (Duloxetine Hcl) .... Once Daily 2)  Toprol Xl 100 Mg Xr24h-Tab (Metoprolol Succinate) .... Take One Tablet Once Daily 3)  Vytorin 10-80 Mg Tabs (Ezetimibe-Simvastatin) .... Take One Tablet Once Daily 4)  Lyrica 75 Mg Caps (Pregabalin) .Marland Kitchen.. 1 Tab  Three Times A Day 5)  Imdur 30 Mg Xr24h-Tab (Isosorbide Mononitrate) .... Take 1  Tablet Two Times A Day 6)  Diovan 40 Mg Tabs (Valsartan) .Marland Kitchen.. 1 By Mouth Daily 7)  Lasix 40 Mg Tabs (Furosemide) .Marland Kitchen.. 1 Tab Two Times A Day 8)  Xanax 0.5 Mg Tabs (Alprazolam) .... As Needed 9)  Zyrtec Allergy 10 Mg Caps (Cetirizine Hcl) .Marland Kitchen.. 1 Tab Once Daily 10)  Albuterol Inhaler .... As  Directed 11)  Insulin (Humalog Mix 75/25 Pen) .... 2 Shots Daily 12)  Aspirin 81 Mg Tbec (Aspirin) .... Take One Tablet By Mouth Daily 13)  Potassium Chloride Crys Cr 20 Meq Cr-Tabs (Potassium Chloride Crys Cr) .... Take One Tablet By Mouth Once Daily 14)  Aerochamber Plus  Misc (Spacer/aero-Holding Chambers) .... Use As Directed 15)  Prilosec 20 Mg Cpdr (Omeprazole) .Marland Kitchen.. 1 Tab Once Daily 16)  Metanx 3-35-2 Mg Tabs (L-Methylfolate-B6-B12) .... 2 Tab  Once Daily 17)  Pentazocine-Apap 25-650 Mg Tabs (Pentazocine-Apap) .... 2 Tabs As Needed For Pain 18)  Revatio 20 Mg Tabs (Sildenafil Citrate) .Marland Kitchen.. 1 By Mouth Three Times A Day 19)  Oxigen 3 Litters .... Daily  Allergies: 1)  ! Sulfa 2)  ! Codeine 3)  ! Niacin 4)  ! Jonne Ply  Past History:  Past Medical History: Last updated: 07/18/2009 Coronary artery disease status post coronary bypass grafting 1997, percutaneous intervention of the right coronary artery 2006 Fibromyalgia Diabetes mellitus Hypertension Hypercholesterolemia Diastolic CHF -EF 09% by echo 2010  Review of Systems       the history of present illness  Vital Signs:  Patient profile:   65 year old female Height:      60 inches Weight:      198 pounds BMI:     38.81 Pulse rate:   57 / minute Pulse rhythm:   regular Resp:  22 per minute BP sitting:   108 / 45  (right arm) Cuff size:   large  Vitals Entered By: Vikki Ports (December 28, 2009 1:27 PM)  Physical Exam  General:   Well-nournished, in no acute distress. Neck: No JVD, HJR, Bruit, or thyroid enlargement Lungs: No tachypnea, clear without wheezing, rales, or rhonchi Cardiovascular: RRR, PMI not displaced, heart sounds normal, no murmurs, gallops, bruit, thrill, or heave. Abdomen: BS normal. Soft without organomegaly, masses, lesions or tenderness. Extremities: without cyanosis, clubbing or edema. Good distal pulses bilateral SKin: Warm, no lesions or rashes  Musculoskeletal: No deformities Neuro: no  focal signs    Impression & Recommendations:  Problem # 1:  ACUTE ON CHRONIC DIASTOLIC HEART FAILURE (ICD-428.33) Patient's heart failure, is pretty stable. She is doing well on the current diuretic dose and taking Zaroxolyn only. If her weight goes over 200. We will recheck labs today. Her updated medication list for this problem includes:    Toprol Xl 100 Mg Xr24h-tab (Metoprolol succinate) .Marland Kitchen... Take one tablet once daily    Imdur 30 Mg Xr24h-tab (Isosorbide mononitrate) .Marland Kitchen... Take 1  tablet two times a day    Diovan 40 Mg Tabs (Valsartan) .Marland Kitchen... 1 by mouth daily    Lasix 40 Mg Tabs (Furosemide) .Marland Kitchen... 1 tab two times a day    Aspirin 81 Mg Tbec (Aspirin) .Marland Kitchen... Take one tablet by mouth daily  Problem # 2:  SYNCOPE AND COLLAPSE (ICD-780.2) Patient had an episode of syncope when her oxygen tank ran out last week, and she came around. When oxygen was resumed. She did not seek medical help. Currently, she is stable. Her updated medication list for this problem includes:    Toprol Xl 100 Mg Xr24h-tab (Metoprolol succinate) .Marland Kitchen... Take one tablet once daily    Imdur 30 Mg Xr24h-tab (Isosorbide mononitrate) .Marland Kitchen... Take 1  tablet two times a day    Aspirin 81 Mg Tbec (Aspirin) .Marland Kitchen... Take one tablet by mouth daily  Problem # 3:  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) Her Revatio was stopped last week by Dr. Corliss Blacker. We will see how she does off his medication  Problem # 4:  RENAL INSUFFICIENCY (ICD-588.9) BUN was 55, creatinine 1.6, December 21, 2009. We will repeat these today  Other Orders: TLB-BMP (Basic Metabolic Panel-BMET) (80048-METABOL) TLB-BNP (B-Natriuretic Peptide) (83880-BNPR)  Patient Instructions: 1)  Your physician recommends that you schedule a follow-up appointment on Friday, March 25 with Dr. Juanda Chance (already scheduled). 2)  Your physician recommends that you get lab work today. 3)  Your physician recommends that you continue on your current medications as directed. Please refer to  the Current Medication list given to you today.  Appended Document: [facility] Let pt know labs were ok. Glucose up a bit. Repeat when she comes back next week

## 2010-11-23 NOTE — Cardiovascular Report (Signed)
Summary: Heart Failure Program Status Report  Heart Failure Program Status Report   Imported By: Roderic Ovens 12/01/2009 14:01:56  _____________________________________________________________________  External Attachment:    Type:   Image     Comment:   External Document

## 2010-11-23 NOTE — Cardiovascular Report (Signed)
Summary: Heart Failure Program Status Report  Heart Failure Program Status Report   Imported By: Roderic Ovens 12/06/2009 15:07:53  _____________________________________________________________________  External Attachment:    Type:   Image     Comment:   External Document

## 2010-11-23 NOTE — Cardiovascular Report (Signed)
Summary: Heart Failure Program Status Report  Heart Failure Program Status Report   Imported By: Roderic Ovens 01/19/2010 14:11:19  _____________________________________________________________________  External Attachment:    Type:   Image     Comment:   External Document

## 2010-11-23 NOTE — Cardiovascular Report (Signed)
Summary: Heart Failure Program Patient Disenrollment Notice   Heart Failure Program Patient Disenrollment Notice   Imported By: Roderic Ovens 01/19/2010 14:12:06  _____________________________________________________________________  External Attachment:    Type:   Image     Comment:   External Document

## 2010-11-23 NOTE — Progress Notes (Signed)
Summary: [facility] pt  Phone Note Other Incoming   Caller: Patient Summary of Call: pt is not a [facility] pt but a [facility] pt/  Sent order to [facility] - needs to go to [facility]. Initial call taken by: Eugene Gavia,  April 03, 2010 3:26 PM Caller: Deb @ [facility] Summary of Call: pt is not a drs pt.  Need to send recent request to [facility] -  Initial call taken by: Eugene Gavia,  April 03, 2010 3:27 PM  Follow-up for Phone Call        order resent to [facility] instead of drs  Follow-up by: Oneita Jolly,  April 03, 2010 3:33 PM

## 2010-11-23 NOTE — Miscellaneous (Signed)
Summary: [facility] Orders   [facility] Orders   Imported By: Roderic Ovens 03/16/2010 13:19:36  _____________________________________________________________________  External Attachment:    Type:   Image     Comment:   External Document

## 2010-11-23 NOTE — Medication Information (Signed)
Summary: Lab Orders  Lab Orders   Imported By: Marylou Mccoy 09/04/2010 09:49:41  _____________________________________________________________________  External Attachment:    Type:   Image     Comment:   External Document

## 2010-11-23 NOTE — Assessment & Plan Note (Signed)
Summary: Nurse visit - weight gain/ sob  Nurse Visit   Vital Signs:  Patient profile:   65 year old female Weight:      197 pounds O2 Sat:      97 % on 3 L/min Pulse rate:   64 / minute BP sitting:   106 / 58  (right arm) Cuff size:   regular  Vitals Entered By: Sherri Rad, RN, BSN (November 29, 2009 3:29 PM)  O2 Flow:  3 L/min  Visit Type:  Nurse visit  CC:  The pt is here today for labs and a weight check. She called the office this morning reporting her weight was 194 lbs at home yesterday and she is 195 lbs today. The pt did take her metalozone 2.5mg  yesterday and today. She reported decreased urine output yesterday. Per Dr. Gala Romney- the pt was brought in for a weight check and labs today. The pt states she has been urinating a little more throughout the day today. She states "I just don't feel good." She c/o a cold for about 3 weeks now as well- she has been treated with a Z-pack per her PCP for this. I discussed the pt's weight in the office today with Dr. Gala Romney. Orders have been given for the pt to increased her lasix to 80mg  bid x 3 days- repeat a bmet on friday- & f/u with Dr.  Juanda Chance on monday. I explained to the pt that if her symptoms are not any better with her cold that she may need a chest x-ray on monday..   Patient Instructions: 1)  Your physician recommends that you schedule a follow-up appointment in: Monday 12/05/09 @ 3:30pm. 2)  Your physician recommends that you return for lab work on friday 12/02/09- bmet (428.0;786.05) 3)  Your physician has recommended you m ake the following change in your medication: 1) Increase lasix to 80mg  two times a day x 3 days.   Allergies: 1)  ! Sulfa 2)  ! Codeine 3)  ! Niacin 4)  ! Jonne Ply

## 2010-11-23 NOTE — Progress Notes (Signed)
Summary: [facility]- lab f/u   Phone Note Outgoing Call Call back at [phone] [facility])   Call placed by: Sherri Rad, RN, BSN,  Mar 02, 2010 9:36 AM Call placed to: [facility]  Summary of Call: I called and spoke with [facility] this moring in [place]. The pt's labs were drawn late yesterday afternoon. They have not received the results, but will fax when received. I will await results. Sherri Rad, RN, BSN  Mar 02, 2010 9:38 AM   Fax received- results in EMR. Pt aware of results. Initial call taken by: Sherri Rad, RN, BSN,  Mar 02, 2010 5:37 PM

## 2010-11-23 NOTE — Assessment & Plan Note (Signed)
Summary: f31m   Referring Provider:  Dr. Charlies Constable Primary Provider:  dr fusco   History of Present Illness: Patient is 65 years old and return for management of CAD and severe diastolic CHF. She has had previous bypass surgery and previous PCI of a chronic total occlusion of the right coronary artery. She has severe diastolic heart failure and severe pulmonary hypertension with pulmonary systolic pressures of about 100 mm.  She feels she's not been doing well. She is pretty much confined to the wheelchair and bed. She's only been out of the house to [facility] maybe one time in the past several weeks.  I spoke to Dr. Ortencia Kick today. She apparently does not appear to be triggering her pulsed oxygen. Her sat today were in the 70s and 80s and he documented the same thing on her visit there. He told me that he thinks she will need continuous oxygen and not be able to use the pulse oxygen. She also told me that she felt better when she had a facemask giving her oxygen at night. Dr. Ortencia Kick said that when she had a sleep study she did not have sleep apnea but she did have hypoxia related to hypoventilation. It is possible that a facemask might work better he said.  Current Medications (verified): 1)  Cymbalta 60 Mg Cpep (Duloxetine Hcl) .... Once Daily 2)  Toprol Xl 100 Mg Xr24h-Tab (Metoprolol Succinate) .... Take One Tablet Once Daily 3)  Vytorin 10-80 Mg Tabs (Ezetimibe-Simvastatin) .... Take One Tablet Once Daily 4)  Lyrica 75 Mg Caps (Pregabalin) .Marland Kitchen.. 1 Tab  Three Times A Day 5)  Imdur 30 Mg Xr24h-Tab (Isosorbide Mononitrate) .... Take 1  Tablet Two Times A Day 6)  Diovan 160 Mg Tabs (Valsartan) .... Take 1 Tablet By Mouth Daily 7)  Lasix 40 Mg Tabs (Furosemide) .Marland Kitchen.. 1 Tab Two Times A Day (If 193 Lbs or More) (Take 1 Tablet By Mouth Once A Day When Less Than 193) 8)  Xanax 0.5 Mg Tabs (Alprazolam) .... As Needed 9)  Zyrtec Allergy 10 Mg Caps (Cetirizine Hcl) .Marland Kitchen.. 1 Tab Once Daily 10)  Albuterol Inhaler  .... As Directed 11)  Insulin (Humalog Mix 75/25 Pen) .... 2 Shots Daily 12)  Aspirin 81 Mg Tbec (Aspirin) .... Take One Tablet By Mouth Daily 13)  Potassium Chloride Crys Cr 20 Meq Cr-Tabs (Potassium Chloride Crys Cr) .... Take One Tablet By Mouth Once Daily 14)  Aerochamber Plus  Misc (Spacer/aero-Holding Chambers) .... Use As Directed 15)  Prilosec 20 Mg Cpdr (Omeprazole) .Marland Kitchen.. 1 Tab Once Daily 16)  Metanx 3-35-2 Mg Tabs (L-Methylfolate-B6-B12) .... 2 Tab  Once Daily 17)  Pentazocine-Apap 25-650 Mg Tabs (Pentazocine-Apap) .... 2 Tabs As Needed For Pain 18)  Oxygen 3 Liters .... Daily  Allergies (verified): 1)  ! Sulfa 2)  ! Codeine 3)  ! Niacin 4)  ! Jonne Ply  Past History:  Past Medical History: Reviewed history from 01/12/2010 and no changes required. 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   Vital Signs:  Patient profile:   65 year old female Height:      60 inches Weight:      193 pounds BMI:     37.83 O2 Sat:      73 % on 4 L/min Pulse rate:   61 / minute Resp:     18 per minute BP sitting:   112 / 61  (right arm)  Vitals Entered By: Marrion Coy, CNA (March 30, 2010 4:04 PM)  O2 Flow:  4  L/min  Physical Exam  Additional Exam:  Gen. Well-nourished, in no distress   Neck: JVP 3 cm, thyroid not enlarged, no carotid bruits Lungs: No tachypnea, clear without rales, rhonchi or wheezes, decreased breath sounds Cardiovascular: Rhythm regular, PMI not displaced,  heart sounds  normal, no murmurs or gallops, 2+ bilateral peripheral edema, pulses normal in all 4 extremities. Abdomen: BS normal, abdomen soft and non-tender without masses or organomegaly, no hepatosplenomegaly. MS: No deformities, no cyanosis or clubbing   Neuro:  No focal sns   Skin:  no lesions    Impression & Recommendations:  Problem # 1:  ACUTE ON CHRONIC DIASTOLIC HEART FAILURE (ICD-428.33) She has severe diastolic heart failure. I believe she does have volume overload. Her last creatinine was less than one so I think we can increase her diuresis. Before we were giving 80 mg of Lasix for a weight of 193 pounds or greater. We will change this to give her 80 mg of Lasix for a weight of 190 pounds or greater and 40 mg for a weight of 189 pounds or less. Her updated medication list for this problem includes:    Toprol Xl 100 Mg Xr24h-tab (Metoprolol succinate) .Marland Kitchen... Take one tablet once daily    Imdur 30 Mg Xr24h-tab (Isosorbide mononitrate) .Marland Kitchen... Take 1  tablet two times a day    Diovan 160 Mg Tabs (Valsartan) .Marland Kitchen... Take 1 tablet by mouth daily    Lasix 40 Mg Tabs (Furosemide) .Marland Kitchen... Take two tabs a day ,if your weight is 190 lbs or more. take 1 tablet by mouth once a day when your weight is 189 lbs or less).    Aspirin 81 Mg Tbec (Aspirin) .Marland Kitchen... Take one tablet by mouth daily  Problem # 2:  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) She has pulmonary hypertension which is severe. We have been having trouble with her oxygenation probably related to hypo-ventilation. Dr. [facility] Kick [facility] and asked [facility] to do an assessment of her. We think she will require continuous oxygen rather than pulse oxygen and we  think she may benefit from a face mask at night. He does not appear that CPAP will be required since he does not have apnea although all deferred this to Dr. Lane Hacker opinion.  Problem # 3:  CAD, AUTOLOGOUS BYPASS GRAFT (ICD-414.02) She has had previous bypass surgery and previous PCI. She has not had recent chest pain. This problem appears stable. Her updated medication list for this problem includes:    Toprol Xl 100 Mg Xr24h-tab (Metoprolol succinate) .Marland Kitchen... Take one tablet once daily    Imdur 30 Mg Xr24h-tab (Isosorbide mononitrate) .Marland Kitchen... Take 1  tablet two times a day    Aspirin 81 Mg Tbec (Aspirin) .Marland Kitchen... Take one tablet by mouth daily  Patient Instructions: 1)  Dr. Delton Coombes is ordering [facility] to evaluate your oxygen. 2)  If  your weight is 190 pounds or greater, then take lasix 40mg  two tabs once daily . 3)  If your weight is 189 pounds or less, then take lasix 40mg  one tab once daily . 4)  You will need to have labs drawn in 2 weeks- bmet (428.0). We will ask [facility] to draw this. 5)  Dr. Juanda Chance will see you in 8 weeks.

## 2010-11-23 NOTE — Assessment & Plan Note (Signed)
Summary: dyspnea, biventricular failure   Visit Type:  Follow-up Copy to:  Dr. Charlies Constable Primary Provider/Referring Provider:  dr Sherwood Gambler  CC:  Pt here for 1 month follow up. Pt states stopped Revatio after 3 days due to fluid retention. Pt c/o increased SOB today.  History of Present Illness: 65 yo woman, never smoker, with HTN, CAD/CABG, DM, diastolic dysfxn with several hospitalizations for volume overload and dyspnea over the last yr. She had cardiac cath 07/21/09 that showed CAD w patent graft, severe PAH (mean PAP > 60) with severely elevated wedge pressure. Breathing has responded in past to diuretics and optimization of volume status. She has had PFT that show predominantly restriction, with possible coexisting mild obstruction. She was started on albuterol and she believes it does help some. 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   ROV 11/21/09 -- F/u today for her pulm HTN in setting LV dysfxn as detailed above, probably out of preportion to her known CHF. Tells me that she began to get URI symptoms about a week ago. Still with nasal congestion, dry cough, feels drained.   ROV/hosp f/u 12/20/09 -- Was discharged from hosp last Monday after diuresis. I restarted her Revatio in the hospital, note that she is also on scheduled Imdur. Adjustments made in diuretics 4 days ago by Dr Juanda Chance. She has put on 3 lbs since last week, tell me that she is feeling more dyspnea, orthopnea. She has also heard some wheeze at night when she lays flat. She can't tell that the Revatio has helped her breathing at all. She has not uses SABA frequently, doesn't feel it helps her much.   ROV 02/16/10 -- f/u for HTN, diastolic dysfxn and severe pulm HTN. She is on a stable diuretic regimen, wt has been stable per her scales at home. Some LE edema. Discussed restarting Revatio with her today.   ROV 03/21/10 -- returns for f/u of BiVentricular failure, severe dyspnea.  She was retried on Revatio at our last visit, but began to gain wt, edema and more SOB only a few days after starting. This is the second time she has decompensated on the med. Was d/c'd by Dr Juanda Chance. She rarely uses albuterol. Currently 191-4 lbs, she adjusts her lasix based on target dry wt.   Current Medications (verified): 1)  Cymbalta 60 Mg Cpep (Duloxetine Hcl) .... Once Daily 2)  Toprol Xl 100 Mg Xr24h-Tab (Metoprolol Succinate) .... Take One Tablet Once Daily 3)  Vytorin 10-80 Mg Tabs (Ezetimibe-Simvastatin) .... Take One Tablet Once Daily 4)  Lyrica 75 Mg Caps (Pregabalin) .Marland Kitchen.. 1 Tab  Three Times A Day 5)  Imdur 30 Mg Xr24h-Tab (Isosorbide Mononitrate) .... Take 1  Tablet Two Times A Day 6)  Diovan 160 Mg Tabs (Valsartan) .... Take 1 Tablet By Mouth Daily 7)  Lasix 40 Mg Tabs (Furosemide) .Marland Kitchen.. 1 Tab Two Times A Day (If 193 Lbs or More) (Take 1 Tablet By Mouth Once A Day When Less Than 193) 8)  Xanax 0.5 Mg Tabs (Alprazolam) .... As Needed 9)  Zyrtec Allergy 10 Mg Caps (Cetirizine Hcl) .Marland Kitchen.. 1 Tab Once Daily 10)  Albuterol Inhaler .... As Directed 11)  Insulin (Humalog Mix 75/25 Pen) .... 2 Shots Daily 12)  Aspirin 81 Mg Tbec (Aspirin) .... Take One Tablet By Mouth Daily 13)  Potassium Chloride Crys Cr 20 Meq Cr-Tabs (Potassium Chloride Crys Cr) .... Take One Tablet By Mouth Once Daily 14)  Aerochamber Plus  Misc (Spacer/aero-Holding Chambers) .... Use As Directed 15)  Prilosec 20 Mg Cpdr (Omeprazole) .Marland Kitchen.. 1 Tab Once Daily 16)  Metanx 3-35-2 Mg Tabs (L-Methylfolate-B6-B12) .... 2 Tab  Once Daily 17)  Pentazocine-Apap 25-650 Mg Tabs (Pentazocine-Apap) .... 2 Tabs As Needed For Pain 18)  Oxygen 3 Liters .... Daily  Allergies (verified): 1)  ! Sulfa 2)  ! Codeine 3)  ! Niacin 4)  ! Asa  Vital Signs:  Patient profile:   65 year old female Height:      60 inches Weight:      194 pounds BMI:     38.02 Temp:     97.4 degrees F oral Pulse rate:   62 / minute BP sitting:   150 / 84   (right arm) Cuff size:   regular  Vitals Entered By: Zackery Barefoot CMA (Mar 21, 2010 4:16 PM)  O2 Flow:  75% 3liters pulsed upon arrival, pt O2 increased to 90% 3L continuous  CC: Pt here for 1 month follow up. Pt states stopped Revatio after 3 days due to fluid retention. Pt c/o increased SOB today Comments Medications reviewed with patient Verified contact number and pharmacy with patient Zackery Barefoot CMA  Mar 21, 2010 4:17 PM    Physical Exam  General:  debilitated obese woman in whelchair.   Head:  normocephalic and atraumatic Nose:  no deformity, discharge, inflammation, or lesions Mouth:  no deformity or lesions Neck:  no masses, thyromegaly, or abnormal cervical nodes Lungs:  small volumes, few basilar insp crackles that clear with deep insp, no wheezing Heart:  distant, regular, no M Abdomen:  obese, benign Msk:  no deformity or scoliosis noted with normal posture Extremities:  trace edema Neurologic:  non-focal Skin:  bruising on UE's Psych:  alert and cooperative; normal mood and affect; normal attention span and concentration   Impression & Recommendations:  Problem # 1:  SHORTNESS OF BREATH (ICD-786.05)  BiVentricular failure. failed Revatio. Not sure that there is anything to add beyond O2 and her cardiac regimen which is being closely followed. She might benefit from cardiopulm rehab   Orders: Est. Patient Level IV (82956)  Problem # 2:  CHF (ICD-428.0)  Her updated medication list for this problem includes:    Toprol Xl 100 Mg Xr24h-tab (Metoprolol succinate) .Marland Kitchen... Take one tablet once daily    Diovan 160 Mg Tabs (Valsartan) .Marland Kitchen... Take 1 tablet by mouth daily    Lasix 40 Mg Tabs (Furosemide) .Marland Kitchen... 1 tab two times a day (if 193 lbs or more) (take 1 tablet by mouth once a day when less than 193)    Aspirin 81 Mg Tbec (Aspirin) .Marland Kitchen... Take one tablet by mouth daily  Problem # 3:  DIASTOLIC HEART FAILURE, CHRONIC (ICD-428.32)  Her updated medication  list for this problem includes:    Toprol Xl 100 Mg Xr24h-tab (Metoprolol succinate) .Marland Kitchen... Take one tablet once daily    Diovan 160 Mg Tabs (Valsartan) .Marland Kitchen... Take 1 tablet by mouth daily    Lasix 40 Mg Tabs (Furosemide) .Marland Kitchen... 1 tab two times a day (if 193 lbs or more) (take 1 tablet by mouth once a day when less than 193)    Aspirin 81 Mg Tbec (Aspirin) .Marland Kitchen... Take one tablet by mouth daily  Problem # 4:  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)  Medications Added to Medication List This Visit: 1)  Lasix 40 Mg Tabs (Furosemide) .Marland Kitchen.. 1 tab two times a day (if 193 lbs or more) (take 1  tablet by mouth once a day when less than 193) 2)  Oxygen 3 Liters  .... Daily  Patient Instructions: 1)  We will not restart Revatio 2)  Continue your lasix and other cardiac medications as directed by Dr Juanda Chance 3)  You may benefit from cardiopulmonary rehab at some point in the future.  4)  You may use Albuterol 2 puffs up to every 4 hours if needed for shortness of breath.  5)  Follow with Dr Juanda Chance June 9 as scheduled.  6)  Follow with Dr Delton Coombes as needed.

## 2010-11-23 NOTE — Cardiovascular Report (Signed)
Summary: Heart Failure Program Summary Report  Heart Failure Program Summary Report   Imported By: Roderic Ovens 01/23/2010 14:00:11  _____________________________________________________________________  External Attachment:    Type:   Image     Comment:   External Document

## 2010-11-23 NOTE — Progress Notes (Signed)
Summary: PT HAVING SOB   Phone Note Call from Patient Call back at [facility] Phone [phone]   Caller: Patient Summary of Call: PT HAVING SOB Initial call taken by: Judie Grieve,  July 25, 2010 10:46 AM  Follow-up for Phone Call        I spoke with the pt today. She states that since last week she has been having some SOB.  She states her weight ran at 177lbs for 11 days straight, then on sunday she was 180 lbs, monday- 179 labs, and today- 180 lbs. She had 2 days of increased edema to her legs, but her swelling is very minimal now. She is not SOB at rest, but does get easily SOB moving around her house. She is sleeping. She has not had to use any more pillows than her normal 3. She is coughing up some clear sputum. She states that she and her husband did eat out 2 nights in a row over the weekend. She ate 1/2 steak/baked potato/and onion rings one night, and then hamburger steak and salad the following night. I explained to her that eating out exposes her to a lot of extra sodium and she may be having some effects from this. I explained I will speak with Dr. Juanda Chance and call her back today. She states her PCP is following her CBG closely and that she has been started on Lantus. Follow-up by: Sherri Rad, RN, BSN,  July 25, 2010 11:52 AM  Additional Follow-up for Phone Call Additional follow up Details #1::        I have reviewed the above with Dr. Juanda Chance. Orders received to have the pt take lasix 80mg  every morning and 40mg  every evening until her weight returns to 177 lbs. I have called and advised the pt of this. She verbalizes understanding. Per Dr. Juanda Chance, no further labs needed at this time. Additional Follow-up by: Sherri Rad, RN, BSN,  July 25, 2010 5:32 PM

## 2010-11-23 NOTE — Medication Information (Signed)
Summary: Physician's Orders   Physician's Orders   Imported By: Roderic Ovens 06/13/2010 12:50:51  _____________________________________________________________________  External Attachment:    Type:   Image     Comment:   External Document

## 2010-11-23 NOTE — Letter (Signed)
Summary: Recall Office Visit  [facility]  [address]   [place], [place] [address]   Phone: [phone]  Fax: [phone]      August 24, 2010   Meagan [name] [address] [place], [place]  [address] Dec 08, 1945   Dear Ms. Loconte,   According to our records, it is time for you to schedule a follow-up office visit with [place].   At your convenience, please call [phone] to schedule an office visit. If you have any questions, concerns, or feel that this letter is in error, we would appreciate your call.   Sincerely,    Diana Eves  [facility] Ph: [phone]   Fax: [phone]

## 2010-11-23 NOTE — Progress Notes (Signed)
Summary: retaining fluid   Phone Note Call from Patient Call back at Home Phone [phone] Call back at [phone]   Caller: Patient Reason for Call: Talk to Nurse Details for Reason: Per pt calling, in hospital for 8 day came home on monday, retaining fluid again.  Initial call taken by: Lorne Skeens,  December 14, 2009 9:05 AM  Follow-up for Phone Call        I called and spoke with the pt. She states her weight was 196lbs when she came home from the hospital on monday. She was 197 yesterday and today she is 200lbs. The pt denies increased SOB. She is very concerned about how her weight is flucuating. She states her O2 sats dropped in the hospital to the 50's when she was off her O2 and that her HR would get up when she went to the bathroom. Since the pt has had a recent hospitalization for these symptoms, I am adding her on to see Dr. Juanda Chance this afternoon. The pt is agreeable. Follow-up by: Sherri Rad, RN, BSN,  December 14, 2009 10:30 AM

## 2010-11-23 NOTE — Progress Notes (Signed)
Summary: lab draw   Phone Note Outgoing Call   Call placed by: Sherri Rad, RN, BSN,  Feb 22, 2010 5:37 PM Call placed to: Byrd Hesselbach [place])- [phone] Summary of Call: I have left a message for Byrd Hesselbach with [facility] to call and see if they can draw a bmet on the pt on monday 02/27/10. If they are unable to do this, then the pt has been given an order to go to [facility] and have this done. We will just need to let the pt know.  Initial call taken by: Sherri Rad, RN, BSN,  Feb 22, 2010 5:38 PM  Follow-up for Phone Call        Byrd Hesselbach called back and states they will be able to draw the labs, Migdalia Dk  Feb 23, 2010 11:39 AM

## 2010-11-23 NOTE — Assessment & Plan Note (Signed)
Summary: eph  Medications Added LASIX 40 MG TABS (FUROSEMIDE) 1 tab once daily      Allergies Added:   Visit Type:  Follow-up Referring Provider:  Dr. Charlies Constable Primary Provider:  dr Sherwood Gambler  CC:  Post hospital- add on for weight gain since d/c on 2/21. Pt was 196 lbs on 2/21 and was 200 lbs today. Marland Kitchen  History of Present Illness: The patient is 65 years old and came in today for an unscheduled visit because of increasing fluid and weight gain. She was recently hospitalized with weight gain but her creatinine was up in the range of 4. She was initially hydrated to improve her kidney function and initially gained weight in the hospital but then diuresed after that. She was discharged home 2 days ago and her weight the morning after discharge home was 197. Today her weight went up to 200 and she felt like she did retain fluid and was more short of breath.  She had been discharged on 40 mg of Lasix at home.  She has had no chest pain.  Current Medications (verified): 1)  Cymbalta 60 Mg Cpep (Duloxetine Hcl) .... Once Daily 2)  Toprol Xl 100 Mg Xr24h-Tab (Metoprolol Succinate) .... Take One Tablet Once Daily 3)  Vytorin 10-80 Mg Tabs (Ezetimibe-Simvastatin) .... Take One Tablet Once Daily 4)  Lyrica 75 Mg Caps (Pregabalin) .Marland Kitchen.. 1 Tab  Three Times A Day 5)  Imdur 30 Mg Xr24h-Tab (Isosorbide Mononitrate) .... Take 1  Tablet Two Times A Day 6)  Diovan 40 Mg Tabs (Valsartan) .Marland Kitchen.. 1 By Mouth Daily 7)  Lasix 40 Mg Tabs (Furosemide) .Marland Kitchen.. 1 Tab Once Daily 8)  Xanax 0.5 Mg Tabs (Alprazolam) .... As Needed 9)  Zyrtec Allergy 10 Mg Caps (Cetirizine Hcl) .Marland Kitchen.. 1 Tab Once Daily 10)  Albuterol Inhaler .... As Directed 11)  Insulin (Humalog Mix 75/25 Pen) .... 2 Shots Daily 12)  Aspirin 81 Mg Tbec (Aspirin) .... Take One Tablet By Mouth Daily 13)  Potassium Chloride Crys Cr 20 Meq Cr-Tabs (Potassium Chloride Crys Cr) .... Take One Tablet By Mouth Once Daily 14)  Aerochamber Plus  Misc  (Spacer/aero-Holding Chambers) .... Use As Directed 15)  Prilosec 20 Mg Cpdr (Omeprazole) .Marland Kitchen.. 1 Tab Once Daily 16)  Metanx 3-35-2 Mg Tabs (L-Methylfolate-B6-B12) .... 2 Tab  Once Daily 17)  Pentazocine-Apap 25-650 Mg Tabs (Pentazocine-Apap) .... 2 Tabs As Needed For Pain 18)  Revatio 20 Mg Tabs (Sildenafil Citrate) .Marland Kitchen.. 1 By Mouth Three Times A Day  Allergies (verified): 1)  ! Sulfa 2)  ! Codeine 3)  ! Niacin 4)  ! Jonne Ply  Past History:  Past Medical History: Reviewed history from 07/18/2009 and no changes required. Coronary artery disease status post coronary bypass grafting 1997, percutaneous intervention of the right coronary artery 2006 Fibromyalgia Diabetes mellitus Hypertension Hypercholesterolemia Diastolic CHF -EF 30% by echo 2010  Review of Systems       ROS is negative except as outlined in HPI.   Vital Signs:  Patient profile:   65 year old female Height:      60 inches Weight:      201 pounds BMI:     39.40 Pulse rate:   66 / minute BP sitting:   92 / 50 Cuff size:   regular  Vitals Entered By: Burnett Kanaris, CNA (December 14, 2009 3:47 PM)  Physical Exam  Additional Exam:  Gen. Well-nourished, in no distress   Neck: JVD 3 cm above the clavicle, thyroid not enlarged,  no carotid bruits Lungs: No tachypnea, by basilar rales Cardiovascular: Rhythm regular, PMI not displaced,  heart sounds  normal, no murmurs or gallops, 1+ bilateral peripheral edema, pulses normal in all 4 extremities. Abdomen: BS normal, abdomen soft and non-tender without masses or organomegaly, no hepatosplenomegaly. MS: No deformities, no cyanosis or clubbing   Neuro:  No focal sns   Skin:  no lesions    Impression & Recommendations:  Problem # 1:  ACUTE ON CHRONIC DIASTOLIC HEART FAILURE (ICD-428.33) She has an exacerbation of her CHF. It is a bit confusing because she was admitted to the hospital recently with increased weight gain and shortness of breath but when she got to the  hospital her creatinine was about 4 and she was told she was dry and she was given fluids. She finally improved and her discharge weight was back below what her admission weight was. When she went home her weight was 197 pounds but she's gained to 200 pounds now. We will give her Zaroxolyn today and increase her Lasix maintenance dose to 80 mg a day and try and target her weight between 195 and 200 pounds. We'll cut the Lasix back to one 40 mg tablet a day for weight under 195 and taken extra 40 mg in the afternoon for weight greater than 200. The following medications were removed from the medication list:    Metolazone 2.5 Mg Tabs (Metolazone) .Marland Kitchen... Take as needed Her updated medication list for this problem includes:    Toprol Xl 100 Mg Xr24h-tab (Metoprolol succinate) .Marland Kitchen... Take one tablet once daily    Imdur 30 Mg Xr24h-tab (Isosorbide mononitrate) .Marland Kitchen... Take 1  tablet two times a day    Diovan 40 Mg Tabs (Valsartan) .Marland Kitchen... 1 by mouth daily    Lasix 40 Mg Tabs (Furosemide) .Marland Kitchen... 1 tab once daily    Aspirin 81 Mg Tbec (Aspirin) .Marland Kitchen... Take one tablet by mouth daily  Problem # 2:  CAD, AUTOLOGOUS BYPASS GRAFT (ICD-414.02) She has had previous bypass surgery and previous PCI.  Since transfer patent last catheterization. She's had no chest pain this problem appears stable. Her updated medication list for this problem includes:    Toprol Xl 100 Mg Xr24h-tab (Metoprolol succinate) .Marland Kitchen... Take one tablet once daily    Imdur 30 Mg Xr24h-tab (Isosorbide mononitrate) .Marland Kitchen... Take 1  tablet two times a day    Aspirin 81 Mg Tbec (Aspirin) .Marland Kitchen... Take one tablet by mouth daily  Problem # 3:  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) She was started back on pulmonary vasodilators in the hospital and we will continue those. Her pulmonary pressure was 100 systolic we did her last catheterization.  Other Orders: TLB-BMP (Basic Metabolic Panel-BMET) (80048-METABOL)  Patient Instructions: 1)  Your physician  recommends that you schedule a follow-up appointment in: 1 week with the PA, 2 weeks with the PA, and 4 weeks with Dr. Juanda Chance. 2)  Your physician recommends that you have lab work today: bmet (428.0) 3)  Take Metalozone 2.5mg  today when you get home. 4)  Take Lasix 40mg - two tablets in the morning. 5)  Weigh yourself daily and use the following guidelines: 6)  For a weight of 195-200 lbs- take lasix 40mg  two tabs once daily  7)  For a weight less than 195 lbs- take lasix 40mg  once daily 8)  For a weight of 201 lbs- take lasix 40mg  two tabs in the morning and one tab in the evening 9)  If your weight is 202 or above, call  the office for instructions.

## 2010-11-23 NOTE — Assessment & Plan Note (Signed)
Summary: f6w   Visit Type:  Follow-up Referring Provider:  Dr. Charlies Constable Primary Provider:  dr Sherwood Gambler   History of Present Illness: [name] is 65 years old and returns for management of CAD and diastolic heart failure. She has a previous CABG and previous PCI of a chronic total occlusion the right coronary a number of years ago. Most recently she has been treated for severe diastolic heart failure associated with severe pulmonary hypertension. We have been titrating her Lasix so that she gets 80 mg for a weight of 195 pounds or more and 40 mg for a weight of 194 pounds or less. She says that her weights have been in the range of 191 297 over the past couple of weeks and that her weight was 196 on her home scale today.  She saw Dr. Delton Coombes recently and at my urging he started her back on Revatio.  She and her husband say she has felt much worse since starting the drug. She has had significant dizziness. Today when she was walking across the hall with physical therapy she got extremely short of breath and got anxious. They sat her down and her oxygen saturations were down to 40%. Over some time they've returned to about 91%. She previously walked these distances without this type of problem.  Current Medications (verified): 1)  Cymbalta 60 Mg Cpep (Duloxetine Hcl) .... Once Daily 2)  Toprol Xl 100 Mg Xr24h-Tab (Metoprolol Succinate) .... Take One Tablet Once Daily 3)  Vytorin 10-80 Mg Tabs (Ezetimibe-Simvastatin) .... Take One Tablet Once Daily 4)  Lyrica 75 Mg Caps (Pregabalin) .Marland Kitchen.. 1 Tab  Three Times A Day 5)  Imdur 30 Mg Xr24h-Tab (Isosorbide Mononitrate) .... Take 1  Tablet Two Times A Day 6)  Diovan 160 Mg Tabs (Valsartan) .... Take 1 Tablet By Mouth Daily 7)  Lasix 40 Mg Tabs (Furosemide) .Marland Kitchen.. 1 Tab Two Times A Day 8)  Xanax 0.5 Mg Tabs (Alprazolam) .... As Needed 9)  Zyrtec Allergy 10 Mg Caps (Cetirizine Hcl) .Marland Kitchen.. 1 Tab Once Daily 10)  Albuterol Inhaler .... As Directed 11)  Insulin  (Humalog Mix 75/25 Pen) .... 2 Shots Daily 12)  Aspirin 81 Mg Tbec (Aspirin) .... Take One Tablet By Mouth Daily 13)  Potassium Chloride Crys Cr 20 Meq Cr-Tabs (Potassium Chloride Crys Cr) .... Take One Tablet By Mouth Once Daily 14)  Aerochamber Plus  Misc (Spacer/aero-Holding Chambers) .... Use As Directed 15)  Prilosec 20 Mg Cpdr (Omeprazole) .Marland Kitchen.. 1 Tab Once Daily 16)  Metanx 3-35-2 Mg Tabs (L-Methylfolate-B6-B12) .... 2 Tab  Once Daily 17)  Pentazocine-Apap 25-650 Mg Tabs (Pentazocine-Apap) .... 2 Tabs As Needed For Pain 18)  Oxigen 3 Litters .... Daily  Allergies (verified): 1)  ! Sulfa 2)  ! Codeine 3)  ! Niacin 4)  ! Jonne Ply  Past History:  Past Medical History: Reviewed history from 01/12/2010 and no changes required. 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   Vital Signs:  Patient profile:   65 year old female Height:      60 inches Pulse rate:   61 / minute BP sitting:   123 / 56  (left arm) Cuff size:   large  Vitals Entered By: Burnett Kanaris, CNA (Feb 20, 2010 4:31 PM)  Physical Exam  Additional Exam:  Gen. Well-nourished, in no distress   Neck: JVD 3 cm above the clavicle, thyroid not  enlarged, no carotid bruits Lungs: No tachypnea, clear without rales, rhonchi or wheezes Cardiovascular: Rhythm regular, PMI not displaced,  heart sounds  normal, no murmurs or gallops, 1+ peripheral edema, pulses normal in all 4 extremities. Abdomen: BS normal, abdomen soft and non-tender without masses or organomegaly, no hepatosplenomegaly. MS: No deformities, no cyanosis or clubbing   Neuro:  No focal sns   Skin:  no lesions    Impression & Recommendations:  Problem # 1:  ACUTE ON CHRONIC DIASTOLIC HEART FAILURE (ICD-428.33) She has severe diastolic heart failure. I think she is volume overload today with JVD and peripheral edema. We have increased her Lasix for weight of 195 pounds or more but I think we should targe I've instructed her to take 80 mg of Lasix if her weight is 193 pounds or more and to take 40 mg of Lasix if her weight is 192 or less.  We will get a BMP in one week. We will also try to get a pulse oximeter for her at home. Her updated medication list for this problem includes:    Toprol Xl 100 Mg Xr24h-tab (Metoprolol succinate) .Marland Kitchen... Take one tablet once daily    Imdur 30 Mg Xr24h-tab (Isosorbide mononitrate) .Marland Kitchen... Take 1  tablet two times a day    Diovan 160 Mg Tabs (Valsartan) .Marland Kitchen... Take 1 tablet by mouth daily    Lasix 40 Mg Tabs (Furosemide) .Marland Kitchen... 1 tab two times a day    Aspirin 81 Mg Tbec (Aspirin) .Marland Kitchen... Take one tablet by mouth daily  Problem # 2:  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) She has severe pulmonary hypertension I encouraged Dr. Ortencia Kick to consider pulmonary vasodilator therapy again. She doesn't appear to be tolerating the Revatio.   Last time this was tried she also was hospitalized although we were not sure it was related to the drug. Based on a recent response I think we will have to stop the drug. She and her husband had stopped it yesterday.  Problem # 3:  CAD, AUTOLOGOUS BYPASS GRAFT (ICD-414.02) She has had no chest pain in the palm appears  stable. Her updated medication list for this problem includes:    Toprol Xl 100 Mg Xr24h-tab (Metoprolol succinate) .Marland Kitchen... Take one tablet once daily    Imdur 30 Mg Xr24h-tab (Isosorbide mononitrate) .Marland Kitchen... Take 1  tablet two times a day    Aspirin 81 Mg Tbec (Aspirin) .Marland Kitchen... Take one tablet by mouth daily  Patient Instructions: 1)  Your physician recommends that you schedule a follow-up appointment in: 4 weeks. 2)  Your physician recommends that you have lab work in: 1 week- bmet (428.33;414.01) 3)  Your physician has recommended you make the following change in your medication: 1) STOP Revatio 4)  If your weight is 193 lbs or greater take lasix 80mg  once daily  5)  If your weight is 192 lbs or below take lasix 40mg  once daily

## 2010-11-23 NOTE — Assessment & Plan Note (Signed)
Summary: rov/appt time is 3:30pm/hm   Visit Type:  Follow-up Referring Provider:  Dr. Charlies Constable Primary Provider:  dr Sherwood Gambler  CC:  shortness of breath.  History of Present Illness: The patient is 65 years old and returns for management of CAD and CHF. She has had previous bypass surgery and has had stenting for a chronic total occlusion of the right coronary artery. She has severe diastolic heart failure and has severe pulmonary hypertension with systolic pressures in the pulmonary artery documented 100 mm a catheterization. She also has hypoventilation syndrome.  We have been managing her volume status over the phone. Her weight went up to 199 pounds on July 5. He put her on Zaroxolyn 2.5 every day at that point along with 80 mg of Lasix twice a day and her weight is down to 191 pounds weight has remained last 3 days. She had a BMP showing a BUN of 55 and a creatinine of 1.44 and a potassium of 3.7 on July 8.  Overall she has had good days and bad days. She is mostly confined to the wheelchair but she can walk to the bathroom at home. Her oxygen saturation at home run in the 92-95 range at rest and follow the high 80s after walking.  Current Medications (verified): 1)  Cymbalta 60 Mg Cpep (Duloxetine Hcl) .... Once Daily 2)  Toprol Xl 100 Mg Xr24h-Tab (Metoprolol Succinate) .... Take One Tablet Once Daily 3)  Vytorin 10-80 Mg Tabs (Ezetimibe-Simvastatin) .... Take One Tablet Once Daily 4)  Lyrica 75 Mg Caps (Pregabalin) .Marland Kitchen.. 1 Tab  Three Times A Day 5)  Imdur 30 Mg Xr24h-Tab (Isosorbide Mononitrate) .... Take 1  Tablet Two Times A Day 6)  Diovan 160 Mg Tabs (Valsartan) .... Take 1 Tablet By Mouth Daily 7)  Lasix 40 Mg Tabs (Furosemide) .... 2 By Mouth Two Times A Day 8)  Xanax 0.5 Mg Tabs (Alprazolam) .... As Needed 9)  Zyrtec Allergy 10 Mg Caps (Cetirizine Hcl) .Marland Kitchen.. 1 Tab Once Daily 10)  Albuterol Inhaler .... As Directed 11)  Insulin (Humalog Mix 75/25 Pen) .... 2 Shots Daily 12)   Aspirin 81 Mg Tbec (Aspirin) .... Take One Tablet By Mouth Daily 13)  Potassium Chloride Crys Cr 20 Meq Cr-Tabs (Potassium Chloride Crys Cr) .... Take One Tablet By Mouth Once Daily 14)  Aerochamber Plus  Misc (Spacer/aero-Holding Chambers) .... Use As Directed 15)  Prilosec 20 Mg Cpdr (Omeprazole) .Marland Kitchen.. 1 Tab Once Daily 16)  Metanx 3-35-2 Mg Tabs (L-Methylfolate-B6-B12) .... 2 Tab  Once Daily 17)  Pentazocine-Apap 25-650 Mg Tabs (Pentazocine-Apap) .... 2 Tabs As Needed For Pain 18)  Oxygen 3 Liters .... Daily 19)  Zaroxolyn 2.5 Mg Tabs (Metolazone) .Marland Kitchen.. 1 By Mouth Daily As Needed As Directed  Allergies (verified): 1)  ! Sulfa 2)  ! Codeine 3)  ! Niacin 4)  ! Jonne Ply  Past History:  Past Medical History: Reviewed history from 01/12/2010 and no changes required. 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   Vital Signs:  Patient profile:   65 year old female Height:      60 inches Weight:      193 pounds BMI:     37.83 Pulse rate:   60 / minute Resp:     18 per minute BP sitting:   94 / 52  (left arm)  Vitals Entered By: Marrion Coy, CNA (May 01, 2010 3:53 PM)  Physical Exam  Additional Exam:  Gen.  Well-nourished, in no distress   Neck: jugular venous pulsation to 3 cm above the clavicle, thyroid not enlarged, no carotid bruits Lungs: No tachypnea, clear without rales, rhonchi or wheezes Cardiovascular: Rhythm regular, PMI not displaced,  heart sounds  normal, no murmurs or gallops, 1+ right and trace left peripheral edema, pulses normal in all 4 extremities. Abdomen: BS normal, abdomen soft and non-tender without masses or organomegaly, no hepatosplenomegaly. MS: No deformities, no cyanosis or clubbing   Neuro:  No focal sns   Skin:  no lesions    Impression & Recommendations:  Problem # 1:  CHF (ICD-428.0)  Her updated medication list for this problem includes:    Toprol Xl 100 Mg Xr24h-tab (Metoprolol succinate) .Marland Kitchen... Take one tablet once daily    Imdur 30 Mg Xr24h-tab (Isosorbide mononitrate) .Marland Kitchen... Take 1  tablet two times a day    Diovan 160 Mg Tabs (Valsartan) .Marland Kitchen... Take 1 tablet by mouth daily    Lasix 40 Mg Tabs (Furosemide) .Marland Kitchen... 2 by mouth two times a day    Aspirin 81 Mg Tbec (Aspirin) .Marland Kitchen... Take one tablet by mouth daily    Zaroxolyn 2.5 Mg Tabs (Metolazone) .Marland Kitchen... Take 1 tablet daily while weight is over 190 pounds.  when weight is 190 or less do not take zaroxolyn  Her updated medication list for this problem includes:    Toprol Xl 100 Mg Xr24h-tab (Metoprolol succinate) .Marland Kitchen... Take one tablet once daily    Imdur 30 Mg Xr24h-tab (Isosorbide mononitrate) .Marland Kitchen... Take 1  tablet two times a day    Diovan 160 Mg Tabs (Valsartan) .Marland Kitchen... Take 1 tablet by mouth daily    Lasix 40 Mg Tabs (Furosemide) .Marland Kitchen... 2 by mouth two times a day    Aspirin 81 Mg Tbec (Aspirin) .Marland Kitchen... Take one tablet by mouth daily    Zaroxolyn 2.5 Mg Tabs (Metolazone) .Marland Kitchen... 1 by mouth daily as needed as directed  Orders: EKG w/ Interpretation (93000)  Problem # 2:  CAD, AUTOLOGOUS BYPASS GRAFT (ICD-414.02) She has had previous bypass surgery and previous PCI procedures. She's had no chest pains from her  stable. Her updated medication list for this problem includes:    Toprol Xl 100 Mg Xr24h-tab (Metoprolol succinate) .Marland Kitchen... Take one tablet once daily    Imdur 30 Mg Xr24h-tab (Isosorbide mononitrate) .Marland Kitchen... Take 1  tablet two times a day    Aspirin 81 Mg Tbec (Aspirin) .Marland Kitchen... Take one tablet by mouth daily  Problem # 3:  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) Shas severe pulmonary hypertension. She has not tolerated pulmonary vasodilators. We will continue her current management.  Patient Instructions: 1)  Your physician recommends that you schedule a follow-up appointment in: 6 weeks with Dr. Juanda Chance 2)  Your physician recommends that you have lab work in [place] with results faxed to our office. 3)  Your physician has recommended you make the following change in your medication: TAKE Zaroxolyn 2.5 mg everyday when  weight is over 190pounds.  If weight is 190 or less DO NOT take the Zaroxolyn.

## 2010-11-23 NOTE — Miscellaneous (Signed)
Summary: Revatio RX  Medications Added REVATIO 20 MG TABS (SILDENAFIL CITRATE) 1 by mouth three times a day       Clinical Lists Changes  Medications: Added new medication of REVATIO 20 MG TABS (SILDENAFIL CITRATE) 1 by mouth three times a day - Signed Rx of REVATIO 20 MG TABS (SILDENAFIL CITRATE) 1 by mouth three times a day;  #90 x 3;  Signed;  Entered by: Michel Bickers CMA;  Authorized by: Leslye Peer MD;  Method used: Electronically to [facility] [address], [place], [place], [place]  [address], Ph: [phone] or [phone], Fax: [phone]    Prescriptions: REVATIO 20 MG TABS (SILDENAFIL CITRATE) 1 by mouth three times a day  #90 x 3   Entered by:   Michel Bickers CMA   Authorized by:   Leslye Peer MD   Signed by:   Michel Bickers CMA on 12/09/2009   Method used:   Electronically to        [facility] [address] (retail)       [address]       [place], [place]  [address]       Ph: [phone] or [phone]       Fax: [phone]   RxID:   [id number]

## 2010-11-23 NOTE — Progress Notes (Signed)
Summary: pt swelling more after increase of lasix  Phone Note Call from Patient   Caller: Spouse [phone] Reason for Call: Talk to Nurse Summary of Call: pt was told to double up on her lasix-instead of going down she swelling up more-pls [phone] Initial call taken by: Glynda Jaeger,  April 21, 2010 2:48 PM  Follow-up for Phone Call        I spoke with the pt. Her weight is up to 200 lbs today. She does have lower extremity edema. Per Dr. Juanda Chance- start zaroxalyn 2.5mg  every other day until weight is down to 190 lbs. She will need a repeat bmet on tues 04/25/10. If the pt is not improving over the weekend, she will need to report to the [facility] ER. I have discussed the above with the pt and her husband. The pt's husband is not happy with having to go the hospital over the weekend, b/c last time she was admitted, she was not on cardiology's service. I have explained if she needs to go to the ER, they should contact the doctor on call first and let them know she is coming. I have explained to the pt she needs to cut back on her fluid intake by at least a cup a day. She verbalizes understanding of the above and that she should take her zaroxalyn 30 min. prior to her lasix. I will check on her on tues when I return to the office.  Follow-up by: Sherri Rad, RN, BSN,  April 21, 2010 3:44 PM

## 2010-11-23 NOTE — Miscellaneous (Signed)
Summary: Orders Update  Clinical Lists Changes  Orders: Added new Referral order of DME Referral (DME) - Signed 

## 2010-12-06 ENCOUNTER — Ambulatory Visit: Payer: Self-pay | Admitting: Cardiology

## 2010-12-13 ENCOUNTER — Ambulatory Visit (INDEPENDENT_AMBULATORY_CARE_PROVIDER_SITE_OTHER): Payer: Medicare Other | Admitting: Cardiology

## 2010-12-13 ENCOUNTER — Encounter: Payer: Self-pay | Admitting: Cardiology

## 2010-12-13 DIAGNOSIS — N289 Disorder of kidney and ureter, unspecified: Secondary | ICD-10-CM

## 2010-12-13 DIAGNOSIS — I2510 Atherosclerotic heart disease of native coronary artery without angina pectoris: Secondary | ICD-10-CM

## 2010-12-13 DIAGNOSIS — I2789 Other specified pulmonary heart diseases: Secondary | ICD-10-CM

## 2010-12-13 DIAGNOSIS — I5032 Chronic diastolic (congestive) heart failure: Secondary | ICD-10-CM

## 2010-12-19 NOTE — Assessment & Plan Note (Signed)
Summary: ecg per dr Juanda Chance last ov/rm   Visit Type:  Follow-up Primary Provider:  Dr. Elfredia Nevins   History of Present Illness: 65 year old medically complex woman presents for followup. This is my first meeting with her. She is a former patient of Dr. Juanda Chance, last seen in December 2011. She has also had followup with our Pulmonary division is the past,  Dr. Delton Coombes and Dr. Sherene Sires, although tells me that she is not interested in a return visit.  Medical history is outlined below. Hospital stay with critical illness in late last year was reviewed. She reports feeling reasonably well since her last visit back in December with Dr. Juanda Chance. She states that her weights have been stable, fluctuating by only one or 2 pounds, and not requiring any additional diuretic dosing as suggested by Dr. Juanda Chance in his last note. She states that her weight is typically around 165 pounds. She reports that Dr. Sherwood Gambler drew some labs for followup a few weeks ago, results pending at this time for my review. She continues to use oxygen at home, 3 L nasal cannula, and reports stable NYHA class III dyspnea on exertion.  She reports no progression of lower extremity edema, actually quite pleased with her recent progress. No reported cough or hemoptysis. No regular exertional chest pain.  Medications are reviewed below. Based on records, she failed treatment with Revatio related to pulmonary edema, and was felt to be a poor candidate for Tracleer. Had significant acute renal failure in the setting of diuretics with persistent volume overload, not felt to be a candidate for ACE inhibitors or ARB going forward. Renal function had actually improved quite a bit by the end of her hospitalization last year.  She just recently had a followup echocardiogram done, reviewed below.  Current Medications (verified): 1)  Cymbalta 60 Mg Cpep (Duloxetine Hcl) .... Once Daily 2)  Toprol Xl 100 Mg Xr24h-Tab (Metoprolol Succinate) .... Take One  Tablet Once Daily 3)  Vytorin 10-40 Mg Tabs (Ezetimibe-Simvastatin) .... Take One Tablet By Mouth Dailyat Bedtime 4)  Lasix 40 Mg Tabs (Furosemide) .... 2 Tabs Am 1 Tab Pm 5)  Xanax 0.5 Mg Tabs (Alprazolam) .... As Needed 6)  Zyrtec Allergy 10 Mg Caps (Cetirizine Hcl) .Marland Kitchen.. 1 Tab Once Daily 7)  Albuterol Inhaler .... As Directed 8)  Lantus 100 Unit/ml Soln (Insulin Glargine) .... Sliding Scle 9)  Aerochamber Plus  Misc (Spacer/aero-Holding Chambers) .... Use As Directed 10)  Prilosec 20 Mg Cpdr (Omeprazole) .Marland Kitchen.. 1 Tab By Mouth Two Times A Day 11)  Pentazocine-Apap 25-650 Mg Tabs (Pentazocine-Apap) .... 2 Tabs As Needed For Pain 12)  Oxygen 3 Liters .... Daily 13)  Zofran 4 Mg Tabs (Ondansetron Hcl) .... As Needed 14)  Isosorbide Mononitrate Cr 30 Mg Xr24h-Tab (Isosorbide Mononitrate) .... Take One Tablet By Mouth Daily 15)  K-Tabs 10 Meq Cr-Tabs (Potassium Chloride) .... Take 1 Tab Daily 16)  Levothyroxine Sodium 25 Mcg Tabs (Levothyroxine Sodium) .... Take 1 Tab Daily 17)  Ultram 50 Mg Tabs (Tramadol Hcl) .... Take As Needed 18)  Metanx 3-35-2 Mg Tabs (L-Methylfolate-B6-B12) .... Take As Needed For Neuropathy 19)  Aspir-Low 81 Mg Tbec (Aspirin) .... Take 1 Tab Daily 20)  Hydromorphone Hcl 4 Mg Tabs (Hydromorphone Hcl) .... Take 1 Tab As Needed  Allergies (verified): 1)  ! Sulfa 2)  ! Codeine 3)  ! Niacin 4)  ! Asa  Comments:  Nurse/Medical Assistant: patient brought med list pharmacy [facility]  Past History:  Family History: Last  updated: 12/13/2010 Father died at 42 of myocardial infarction Mother died at 41 of oral cancer A brother is alive with heart disease and COPD at 41 A sister is alive and well at 27 Another sister is alive at 81 withcoronary disease and hypertension Two brothers died in a motor vehicle accident  Social History: Last updated: 12/13/2010 Married - No children Tobacco Use - No.  Alcohol Use - no Retired - Clinical biochemist  Past Medical  History: Fibromyalgia Diabetes mellitus Hypertension Hypercholesterolemia Pulmonary hypertension - PAP 110/31 (mean 62) by RHC 9/10, negative PE by chest CT 2010 Failed Revatio - pulmonary edema ARF - poor candidate for ACE-I or ARB Restrictive lung disease with obesity/hypoventilation syndrome Renal insufficiency CAD - multivessel, DES x 2 RCA 2007 Chronic diastolic heart failure Hyperlipidemia MRSA pneumonia  Past Surgical History: Status post hysterectomy CABG 1997 - [place] to LAD, SVG to OM  Family History: Father died at 66 of myocardial infarction Mother died at 56 of oral cancer A brother is alive with heart disease and COPD at 4 A sister is alive and well at 32 Another sister is alive at 46 withcoronary disease and hypertension Two brothers died in a motor vehicle accident  Social History: Married - No children Tobacco Use - No.  Alcohol Use - no Retired - Clinical biochemist  Review of Systems       The patient complains of dyspnea on exertion.  The patient denies anorexia, fever, weight gain, chest pain, syncope, peripheral edema, prolonged cough, hemoptysis, melena, and hematochezia.         No reported palpitations. Stable appetite. Otherwise reviewed and negative except as outlined.  Vital Signs:  Patient profile:   65 year old female Weight:      166 pounds BMI:     32.54 Pulse rate:   75 / minute BP sitting:   101 / 53  (left arm)  Vitals Entered By: Dreama Saa, CNA (December 13, 2010 2:59 PM)  Physical Exam  Additional Exam:  Overweight woman in no acute distress, wearing oxygen. HEENT: Conjunctiva and lids normal, oropharynx with moist mucosa. Neck: Supple, no carotid bruits, elevated JVP, and no thyromegaly. Lungs: Diminished breath sounds but generally clear, no wheezing or crackles. Cardiac: Somewhat indistinct PMI, regular rate and rhythm, loud S2, soft apical systolic murmur, no rub. Abdomen: Soft, nontender, bowel sounds present. Skin:  Warm and dry. Extremities: Trace ankle edema, distal pulses one plus. Musculoskeletal: No kyphosis. Neuropsychiatric: Alert and oriented x3, affect appropriate.   Echocardiogram  Procedure date:  12/05/2010  Findings:      Study Conclusions    - Left ventricle: The cavity size was normal. Systolic function was     normal. The estimated ejection fraction was in the range of 60% to     65%.   - Right ventricle: The cavity size was moderately dilated. Systolic     function was mildly reduced.   - Right atrium: The atrium was mildly dilated.   - Pulmonary arteries: Systolic pressure was moderately to severely     increased. PA peak pressure: 75mm Hg (S).   Impressions:    - Technically very limited study due to poor sound wave     transmission. RV appears moderately dialted with mild hypokinesis     but not seen well. PA pressure moderately to severely elevated.     Consider further evaluation with right heart catheterization or     cardiac MRI. Consider work up for pulmonary embolus if  clinical     scenario consistent with this.  Impression & Recommendations:  Problem # 1:  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)  Severe as noted above, oxygen requiring, status post reported failed therapy with Revatio. Right heart catheterization data from 2010 reviewed above, and recent echocardiogram shows PA systolic pressure of 75 mmHg. Previous CT angiogram of the chest from 2010 did not demonstrate pulmonary embolus. Not certain about pulmonary function tests, but she likely has a component of restrictive lung disease related to obesity/hypoventilation, and also diastolic heart failure over time. While it may be that further therapies are limited, I have recommended a formal consultation with Dr. Gala Romney. Otherwise plan to continue present diuretic regimen, pay careful attention to daily weight, and we will request records regarding recent lab work. Clinical followup with repeat CMET within a  month.  Orders: Misc. Referral (Misc. Ref)  Problem # 2:  RENAL INSUFFICIENCY (ICD-588.9)  Plan to followup repeat lab work.  Problem # 3:  DIASTOLIC HEART FAILURE, CHRONIC (ICD-428.32)  Weight stable, recent echocardiogram reviewed above. No changes made to present regimen. LVEF approximately 65%.  Her updated medication list for this problem includes:    Toprol Xl 100 Mg Xr24h-tab (Metoprolol succinate) .Marland Kitchen... Take one tablet once daily    Lasix 40 Mg Tabs (Furosemide) .Marland Kitchen... 2 tabs am 1 tab pm    Isosorbide Mononitrate Cr 30 Mg Xr24h-tab (Isosorbide mononitrate) .Marland Kitchen... Take one tablet by mouth daily    Aspir-low 81 Mg Tbec (Aspirin) .Marland Kitchen... Take 1 tab daily  Problem # 4:  CORONARY ATHEROSCLEROSIS NATIVE CORONARY ARTERY (ICD-414.01)  No obvious angina at this point. Continue medical therapy.  Her updated medication list for this problem includes:    Toprol Xl 100 Mg Xr24h-tab (Metoprolol succinate) .Marland Kitchen... Take one tablet once daily    Isosorbide Mononitrate Cr 30 Mg Xr24h-tab (Isosorbide mononitrate) .Marland Kitchen... Take one tablet by mouth daily    Aspir-low 81 Mg Tbec (Aspirin) .Marland Kitchen... Take 1 tab daily  Problem # 5:  HYPERLIPIDEMIA-MIXED (ICD-272.4)  Continues on statin therapy. Will plan followup lipid profile in the next few months.  Her updated medication list for this problem includes:    Vytorin 10-40 Mg Tabs (Ezetimibe-simvastatin) .Marland Kitchen... Take one tablet by mouth dailyat bedtime  Other Orders: Future Orders: T-Comprehensive Metabolic Panel ([id number]) ... 12/27/2010  Patient Instructions: 1)  Your physician recommends that you schedule a follow-up appointment in: 1 month 2)  Your physician recommends that you return for lab work in: 2 weeks 3)  You physician recommends you see Dr. Gala Romney for Pulmonary Hypertension 4)  Your physician recommends that you continue on your current medications as directed. Please refer to the Current Medication list given to you today.

## 2010-12-27 ENCOUNTER — Encounter: Payer: Self-pay | Admitting: Cardiology

## 2010-12-28 ENCOUNTER — Encounter: Payer: Self-pay | Admitting: Cardiology

## 2010-12-28 ENCOUNTER — Encounter: Payer: Self-pay | Admitting: *Deleted

## 2010-12-28 LAB — CONVERTED CEMR LAB
Albumin: 4 g/dL (ref 3.5–5.2)
BUN: 12 mg/dL (ref 6–23)
Calcium: 9 mg/dL (ref 8.4–10.5)
Chloride: 93 meq/L — ABNORMAL LOW (ref 96–112)
Glucose, Bld: 242 mg/dL — ABNORMAL HIGH (ref 70–99)
Potassium: 3.9 meq/L (ref 3.5–5.3)
Sodium: 138 meq/L (ref 135–145)
Total Protein: 5.9 g/dL — ABNORMAL LOW (ref 6.0–8.3)

## 2011-01-01 LAB — URINALYSIS, ROUTINE W REFLEX MICROSCOPIC
Bilirubin Urine: NEGATIVE
Ketones, ur: NEGATIVE mg/dL
Nitrite: NEGATIVE
Protein, ur: NEGATIVE mg/dL
Specific Gravity, Urine: 1.01 (ref 1.005–1.030)
Urobilinogen, UA: 0.2 mg/dL (ref 0.0–1.0)

## 2011-01-02 NOTE — Letter (Signed)
Summary: [place] [employer] [facility], agricultural at [facility]  [address], [place] [address]   Phone: [phone]  Fax: [phone]      December 28, 2010 MRN: [id number]   ZAYA KESSENICH [address] [place], [place]  [address]   Dear Ms. Standing,  Your test ordered by Selena Batten has been reviewed by your physician (or physician assistant) and was found to be normal or stable. Your physician (or physician assistant) felt no changes were needed at this time.  ____ Echocardiogram  ____ Cardiac Stress Test  __X__ Lab Work  ____ Peripheral vascular study of arms, legs or neck  ____ CT scan or X-ray  ____ Lung or Breathing test  ____ Other:   Thank you.   Nona Dell, MD, F.A.C.C

## 2011-01-06 LAB — BASIC METABOLIC PANEL
BUN: 60 mg/dL — ABNORMAL HIGH (ref 6–23)
CO2: 28 mEq/L (ref 19–32)
CO2: 31 mEq/L (ref 19–32)
Calcium: 8.1 mg/dL — ABNORMAL LOW (ref 8.4–10.5)
Calcium: 8.5 mg/dL (ref 8.4–10.5)
Chloride: 87 mEq/L — ABNORMAL LOW (ref 96–112)
Chloride: 97 mEq/L (ref 96–112)
Chloride: 97 mEq/L (ref 96–112)
Chloride: 98 mEq/L (ref 96–112)
GFR calc Af Amer: 18 mL/min — ABNORMAL LOW (ref >60)
GFR calc Af Amer: 33 mL/min — ABNORMAL LOW (ref >60)
GFR calc non Af Amer: 25 mL/min — ABNORMAL LOW (ref >60)
GFR calc non Af Amer: 28 mL/min — ABNORMAL LOW (ref >60)
Glucose, Bld: 112 mg/dL — ABNORMAL HIGH (ref 70–99)
Glucose, Bld: 200 mg/dL — ABNORMAL HIGH (ref 70–99)
Potassium: 2.9 mEq/L — ABNORMAL LOW (ref 3.5–5.1)
Potassium: 3.5 mEq/L (ref 3.5–5.1)
Potassium: 3.6 mEq/L (ref 3.5–5.1)
Potassium: 4.4 mEq/L (ref 3.5–5.1)
Sodium: 131 mEq/L — ABNORMAL LOW (ref 135–145)
Sodium: 138 mEq/L (ref 135–145)
Sodium: 139 mEq/L (ref 135–145)
Sodium: 140 mEq/L (ref 135–145)
Sodium: 143 mEq/L (ref 135–145)

## 2011-01-06 LAB — CBC
HCT: 28.8 % — ABNORMAL LOW (ref 36.0–46.0)
HCT: 28.8 % — ABNORMAL LOW (ref 36.0–46.0)
HCT: 29.2 % — ABNORMAL LOW (ref 36.0–46.0)
HCT: 29.5 % — ABNORMAL LOW (ref 36.0–46.0)
HCT: 30.1 % — ABNORMAL LOW (ref 36.0–46.0)
HCT: 32.9 % — ABNORMAL LOW (ref 36.0–46.0)
Hemoglobin: 10.1 g/dL — ABNORMAL LOW (ref 12.0–15.0)
Hemoglobin: 10.1 g/dL — ABNORMAL LOW (ref 12.0–15.0)
Hemoglobin: 10.8 g/dL — ABNORMAL LOW (ref 12.0–15.0)
Hemoglobin: 11.6 g/dL — ABNORMAL LOW (ref 12.0–15.0)
Hemoglobin: 9.4 g/dL — ABNORMAL LOW (ref 12.0–15.0)
Hemoglobin: 9.6 g/dL — ABNORMAL LOW (ref 12.0–15.0)
Hemoglobin: 9.7 g/dL — ABNORMAL LOW (ref 12.0–15.0)
MCH: 29.7 pg (ref 26.0–34.0)
MCH: 30.3 pg (ref 26.0–34.0)
MCH: 30.7 pg (ref 26.0–34.0)
MCH: 30.9 pg (ref 26.0–34.0)
MCHC: 32.8 g/dL (ref 30.0–36.0)
MCHC: 32.8 g/dL (ref 30.0–36.0)
MCHC: 32.9 g/dL (ref 30.0–36.0)
MCHC: 33 g/dL (ref 30.0–36.0)
MCHC: 33.1 g/dL (ref 30.0–36.0)
MCHC: 33.7 g/dL (ref 30.0–36.0)
MCV: 91 fL (ref 78.0–100.0)
MCV: 91.1 fL (ref 78.0–100.0)
MCV: 91.1 fL (ref 78.0–100.0)
MCV: 92.4 fL (ref 78.0–100.0)
Platelets: 224 10*3/uL (ref 150–400)
Platelets: 249 10*3/uL (ref 150–400)
Platelets: 256 10*3/uL (ref 150–400)
RBC: 3.14 MIL/uL — ABNORMAL LOW (ref 3.87–5.11)
RBC: 3.27 MIL/uL — ABNORMAL LOW (ref 3.87–5.11)
RBC: 3.39 MIL/uL — ABNORMAL LOW (ref 3.87–5.11)
RBC: 3.74 MIL/uL — ABNORMAL LOW (ref 3.87–5.11)
RDW: 15.9 % — ABNORMAL HIGH (ref 11.5–15.5)
RDW: 16 % — ABNORMAL HIGH (ref 11.5–15.5)
RDW: 16.2 % — ABNORMAL HIGH (ref 11.5–15.5)
RDW: 16.4 % — ABNORMAL HIGH (ref 11.5–15.5)
RDW: 16.7 % — ABNORMAL HIGH (ref 11.5–15.5)
WBC: 11.4 10*3/uL — ABNORMAL HIGH (ref 4.0–10.5)
WBC: 11.9 10*3/uL — ABNORMAL HIGH (ref 4.0–10.5)
WBC: 12 10*3/uL — ABNORMAL HIGH (ref 4.0–10.5)
WBC: 14.9 10*3/uL — ABNORMAL HIGH (ref 4.0–10.5)
WBC: 15.4 10*3/uL — ABNORMAL HIGH (ref 4.0–10.5)
WBC: 16 10*3/uL — ABNORMAL HIGH (ref 4.0–10.5)

## 2011-01-06 LAB — URINALYSIS, ROUTINE W REFLEX MICROSCOPIC
Glucose, UA: NEGATIVE mg/dL
Ketones, ur: NEGATIVE mg/dL
Nitrite: NEGATIVE
Nitrite: NEGATIVE
Protein, ur: NEGATIVE mg/dL
Protein, ur: NEGATIVE mg/dL
Urobilinogen, UA: 0.2 mg/dL (ref 0.0–1.0)

## 2011-01-06 LAB — GLUCOSE, CAPILLARY
Glucose-Capillary: 108 mg/dL — ABNORMAL HIGH (ref 70–99)
Glucose-Capillary: 118 mg/dL — ABNORMAL HIGH (ref 70–99)
Glucose-Capillary: 144 mg/dL — ABNORMAL HIGH (ref 70–99)
Glucose-Capillary: 155 mg/dL — ABNORMAL HIGH (ref 70–99)
Glucose-Capillary: 162 mg/dL — ABNORMAL HIGH (ref 70–99)
Glucose-Capillary: 175 mg/dL — ABNORMAL HIGH (ref 70–99)
Glucose-Capillary: 178 mg/dL — ABNORMAL HIGH (ref 70–99)
Glucose-Capillary: 199 mg/dL — ABNORMAL HIGH (ref 70–99)
Glucose-Capillary: 213 mg/dL — ABNORMAL HIGH (ref 70–99)
Glucose-Capillary: 222 mg/dL — ABNORMAL HIGH (ref 70–99)
Glucose-Capillary: 227 mg/dL — ABNORMAL HIGH (ref 70–99)
Glucose-Capillary: 251 mg/dL — ABNORMAL HIGH (ref 70–99)
Glucose-Capillary: 273 mg/dL — ABNORMAL HIGH (ref 70–99)
Glucose-Capillary: 73 mg/dL (ref 70–99)
Glucose-Capillary: 97 mg/dL (ref 70–99)

## 2011-01-06 LAB — POCT I-STAT 3, ART BLOOD GAS (G3+)
Acid-Base Excess: 13 mmol/L — ABNORMAL HIGH (ref 0.0–2.0)
Acid-Base Excess: 8 mmol/L — ABNORMAL HIGH (ref 0.0–2.0)
Acid-Base Excess: 9 mmol/L — ABNORMAL HIGH (ref 0.0–2.0)
Acid-Base Excess: 9 mmol/L — ABNORMAL HIGH (ref 0.0–2.0)
Bicarbonate: 33.5 mEq/L — ABNORMAL HIGH (ref 20.0–24.0)
Bicarbonate: 34.1 mEq/L — ABNORMAL HIGH (ref 20.0–24.0)
Bicarbonate: 34.4 mEq/L — ABNORMAL HIGH (ref 20.0–24.0)
Bicarbonate: 34.9 mEq/L — ABNORMAL HIGH (ref 20.0–24.0)
Bicarbonate: 38.4 mEq/L — ABNORMAL HIGH (ref 20.0–24.0)
O2 Saturation: 88 %
O2 Saturation: 95 %
O2 Saturation: 95 %
O2 Saturation: 98 %
Patient temperature: 36.8
Patient temperature: 37.3
Patient temperature: 37.3
Patient temperature: 37.9
TCO2: 35 mmol/L (ref 0–100)
TCO2: 36 mmol/L (ref 0–100)
TCO2: 36 mmol/L (ref 0–100)
TCO2: 36 mmol/L (ref 0–100)
TCO2: 36 mmol/L (ref 0–100)
TCO2: 40 mmol/L (ref 0–100)
pCO2 arterial: 16.6 mmHg — CL (ref 35.0–45.0)
pCO2 arterial: 36.9 mmHg (ref 35.0–45.0)
pH, Arterial: 7.451 — ABNORMAL HIGH (ref 7.350–7.400)
pH, Arterial: 7.624 (ref 7.350–7.400)
pH, Arterial: 7.832 (ref 7.350–7.400)
pO2, Arterial: 9 mmHg — CL (ref 80.0–100.0)

## 2011-01-06 LAB — BLOOD GAS, ARTERIAL
Bicarbonate: 36.8 mEq/L — ABNORMAL HIGH (ref 20.0–24.0)
O2 Content: 3 L/min
O2 Saturation: 98.6 %
pCO2 arterial: 54.1 mmHg — ABNORMAL HIGH (ref 35.0–45.0)
pO2, Arterial: 88.2 mmHg (ref 80.0–100.0)

## 2011-01-06 LAB — COMPREHENSIVE METABOLIC PANEL
ALT: 26 U/L (ref 0–35)
ALT: 35 U/L (ref 0–35)
AST: 23 U/L (ref 0–37)
Alkaline Phosphatase: 77 U/L (ref 39–117)
Alkaline Phosphatase: 78 U/L (ref 39–117)
BUN: 18 mg/dL (ref 6–23)
BUN: 32 mg/dL — ABNORMAL HIGH (ref 6–23)
CO2: 33 mEq/L — ABNORMAL HIGH (ref 19–32)
CO2: 33 mEq/L — ABNORMAL HIGH (ref 19–32)
CO2: 41 mEq/L (ref 19–32)
Calcium: 8.2 mg/dL — ABNORMAL LOW (ref 8.4–10.5)
Chloride: 100 mEq/L (ref 96–112)
Chloride: 93 mEq/L — ABNORMAL LOW (ref 96–112)
Creatinine, Ser: 1.46 mg/dL — ABNORMAL HIGH (ref 0.4–1.2)
GFR calc Af Amer: 46 mL/min — ABNORMAL LOW (ref >60)
GFR calc non Af Amer: 36 mL/min — ABNORMAL LOW (ref >60)
GFR calc non Af Amer: 38 mL/min — ABNORMAL LOW (ref >60)
GFR calc non Af Amer: 38 mL/min — ABNORMAL LOW (ref >60)
Glucose, Bld: 136 mg/dL — ABNORMAL HIGH (ref 70–99)
Glucose, Bld: 216 mg/dL — ABNORMAL HIGH (ref 70–99)
Potassium: 2 mEq/L — CL (ref 3.5–5.1)
Potassium: 2.2 mEq/L — CL (ref 3.5–5.1)
Sodium: 138 mEq/L (ref 135–145)
Sodium: 143 mEq/L (ref 135–145)
Total Bilirubin: 0.9 mg/dL (ref 0.3–1.2)
Total Bilirubin: 1.2 mg/dL (ref 0.3–1.2)
Total Protein: 5.5 g/dL — ABNORMAL LOW (ref 6.0–8.3)

## 2011-01-06 LAB — POCT CARDIAC MARKERS
CKMB, poc: 1.1 ng/mL (ref 1.0–8.0)
CKMB, poc: 1.4 ng/mL (ref 1.0–8.0)
Troponin i, poc: <0.05 ng/mL (ref 0.00–0.09)

## 2011-01-06 LAB — DIFFERENTIAL
Basophils Absolute: 0 10*3/uL (ref 0.0–0.1)
Basophils Relative: 0 % (ref 0–1)
Lymphocytes Relative: 6 % — ABNORMAL LOW (ref 12–46)
Monocytes Absolute: 1 10*3/uL (ref 0.1–1.0)
Neutro Abs: 10.3 10*3/uL — ABNORMAL HIGH (ref 1.7–7.7)
Neutrophils Relative %: 86 % — ABNORMAL HIGH (ref 43–77)

## 2011-01-06 LAB — LIPID PANEL
Cholesterol: 102 mg/dL (ref 0–200)
HDL: 35 mg/dL — ABNORMAL LOW (ref >39)
LDL Cholesterol: 50 mg/dL (ref 0–99)
Total CHOL/HDL Ratio: 2.9 RATIO
VLDL: 17 mg/dL (ref 0–40)

## 2011-01-06 LAB — RENAL FUNCTION PANEL
CO2: 33 mEq/L — ABNORMAL HIGH (ref 19–32)
Calcium: 9.1 mg/dL (ref 8.4–10.5)
Creatinine, Ser: 1.67 mg/dL — ABNORMAL HIGH (ref 0.4–1.2)
GFR calc Af Amer: 37 mL/min — ABNORMAL LOW (ref >60)
GFR calc non Af Amer: 31 mL/min — ABNORMAL LOW (ref >60)
Phosphorus: 3.8 mg/dL (ref 2.3–4.6)
Sodium: 144 mEq/L (ref 135–145)

## 2011-01-06 LAB — URINE CULTURE
Culture: NO GROWTH
Culture: NO GROWTH

## 2011-01-06 LAB — D-DIMER, QUANTITATIVE: D-Dimer, Quant: 1.17 ug/mL-FEU — ABNORMAL HIGH (ref 0.00–0.48)

## 2011-01-06 LAB — URINE MICROSCOPIC-ADD ON

## 2011-01-06 LAB — CULTURE, BLOOD (ROUTINE X 2): Culture: NO GROWTH

## 2011-01-06 LAB — BRAIN NATRIURETIC PEPTIDE
Pro B Natriuretic peptide (BNP): 390 pg/mL — ABNORMAL HIGH (ref 0.0–100.0)
Pro B Natriuretic peptide (BNP): 804 pg/mL — ABNORMAL HIGH (ref 0.0–100.0)

## 2011-01-06 LAB — TROPONIN I: Troponin I: 0.04 ng/mL (ref 0.00–0.06)

## 2011-01-06 LAB — LEGIONELLA ANTIGEN, URINE: Legionella Antigen, Urine: NEGATIVE

## 2011-01-06 LAB — POCT I-STAT, CHEM 8
Calcium, Ion: 0.86 mmol/L — ABNORMAL LOW (ref 1.12–1.32)
Chloride: 98 mEq/L (ref 96–112)
HCT: 27 % — ABNORMAL LOW (ref 36.0–46.0)
Sodium: 134 mEq/L — ABNORMAL LOW (ref 135–145)
TCO2: 34 mmol/L (ref 0–100)

## 2011-01-06 LAB — POTASSIUM: Potassium: 2.8 mEq/L — ABNORMAL LOW (ref 3.5–5.1)

## 2011-01-06 LAB — HEPARIN LEVEL (UNFRACTIONATED): Heparin Unfractionated: 0.58 IU/mL (ref 0.30–0.70)

## 2011-01-06 LAB — CULTURE, RESPIRATORY W GRAM STAIN

## 2011-01-06 LAB — PROLACTIN: Prolactin: 5.6 ng/mL

## 2011-01-06 LAB — TSH: TSH: 1.107 u[IU]/mL (ref 0.350–4.500)

## 2011-01-10 LAB — BASIC METABOLIC PANEL
BUN: 13 mg/dL (ref 6–23)
BUN: 19 mg/dL (ref 6–23)
BUN: 38 mg/dL — ABNORMAL HIGH (ref 6–23)
CO2: 30 mEq/L (ref 19–32)
CO2: 31 mEq/L (ref 19–32)
CO2: 31 mEq/L (ref 19–32)
CO2: 35 mEq/L — ABNORMAL HIGH (ref 19–32)
Calcium: 8 mg/dL — ABNORMAL LOW (ref 8.4–10.5)
Calcium: 9.4 mg/dL (ref 8.4–10.5)
Chloride: 103 mEq/L (ref 96–112)
Chloride: 104 mEq/L (ref 96–112)
Chloride: 95 mEq/L — ABNORMAL LOW (ref 96–112)
Chloride: 96 mEq/L (ref 96–112)
Chloride: 97 mEq/L (ref 96–112)
Creatinine, Ser: 0.99 mg/dL (ref 0.4–1.2)
Creatinine, Ser: 1.04 mg/dL (ref 0.4–1.2)
Creatinine, Ser: 1.39 mg/dL — ABNORMAL HIGH (ref 0.4–1.2)
Creatinine, Ser: 2.88 mg/dL — ABNORMAL HIGH (ref 0.4–1.2)
GFR calc Af Amer: 20 mL/min — ABNORMAL LOW (ref >60)
GFR calc Af Amer: 38 mL/min — ABNORMAL LOW (ref >60)
GFR calc Af Amer: 46 mL/min — ABNORMAL LOW (ref >60)
GFR calc Af Amer: 48 mL/min — ABNORMAL LOW (ref >60)
GFR calc Af Amer: >60 mL/min (ref >60)
GFR calc non Af Amer: 31 mL/min — ABNORMAL LOW (ref >60)
GFR calc non Af Amer: 39 mL/min — ABNORMAL LOW (ref >60)
GFR calc non Af Amer: 49 mL/min — ABNORMAL LOW (ref >60)
GFR calc non Af Amer: 49 mL/min — ABNORMAL LOW (ref >60)
GFR calc non Af Amer: 53 mL/min — ABNORMAL LOW (ref >60)
GFR calc non Af Amer: 56 mL/min — ABNORMAL LOW (ref >60)
Glucose, Bld: 137 mg/dL — ABNORMAL HIGH (ref 70–99)
Glucose, Bld: 160 mg/dL — ABNORMAL HIGH (ref 70–99)
Glucose, Bld: 64 mg/dL — ABNORMAL LOW (ref 70–99)
Potassium: 3 mEq/L — ABNORMAL LOW (ref 3.5–5.1)
Potassium: 3.5 mEq/L (ref 3.5–5.1)
Potassium: 3.5 mEq/L (ref 3.5–5.1)
Potassium: 4 mEq/L (ref 3.5–5.1)
Potassium: 4.2 mEq/L (ref 3.5–5.1)
Potassium: 4.6 mEq/L (ref 3.5–5.1)
Sodium: 141 mEq/L (ref 135–145)
Sodium: 141 mEq/L (ref 135–145)
Sodium: 141 mEq/L (ref 135–145)
Sodium: 144 mEq/L (ref 135–145)

## 2011-01-10 LAB — GLUCOSE, CAPILLARY
Glucose-Capillary: 104 mg/dL — ABNORMAL HIGH (ref 70–99)
Glucose-Capillary: 123 mg/dL — ABNORMAL HIGH (ref 70–99)
Glucose-Capillary: 127 mg/dL — ABNORMAL HIGH (ref 70–99)
Glucose-Capillary: 137 mg/dL — ABNORMAL HIGH (ref 70–99)
Glucose-Capillary: 138 mg/dL — ABNORMAL HIGH (ref 70–99)
Glucose-Capillary: 140 mg/dL — ABNORMAL HIGH (ref 70–99)
Glucose-Capillary: 142 mg/dL — ABNORMAL HIGH (ref 70–99)
Glucose-Capillary: 151 mg/dL — ABNORMAL HIGH (ref 70–99)
Glucose-Capillary: 155 mg/dL — ABNORMAL HIGH (ref 70–99)
Glucose-Capillary: 161 mg/dL — ABNORMAL HIGH (ref 70–99)
Glucose-Capillary: 161 mg/dL — ABNORMAL HIGH (ref 70–99)
Glucose-Capillary: 163 mg/dL — ABNORMAL HIGH (ref 70–99)
Glucose-Capillary: 168 mg/dL — ABNORMAL HIGH (ref 70–99)
Glucose-Capillary: 170 mg/dL — ABNORMAL HIGH (ref 70–99)
Glucose-Capillary: 173 mg/dL — ABNORMAL HIGH (ref 70–99)
Glucose-Capillary: 177 mg/dL — ABNORMAL HIGH (ref 70–99)
Glucose-Capillary: 188 mg/dL — ABNORMAL HIGH (ref 70–99)
Glucose-Capillary: 201 mg/dL — ABNORMAL HIGH (ref 70–99)
Glucose-Capillary: 250 mg/dL — ABNORMAL HIGH (ref 70–99)
Glucose-Capillary: 26 mg/dL — CL (ref 70–99)
Glucose-Capillary: 270 mg/dL — ABNORMAL HIGH (ref 70–99)
Glucose-Capillary: 274 mg/dL — ABNORMAL HIGH (ref 70–99)
Glucose-Capillary: 32 mg/dL — CL (ref 70–99)
Glucose-Capillary: 44 mg/dL — CL (ref 70–99)
Glucose-Capillary: 49 mg/dL — ABNORMAL LOW (ref 70–99)
Glucose-Capillary: 52 mg/dL — ABNORMAL LOW (ref 70–99)
Glucose-Capillary: 53 mg/dL — ABNORMAL LOW (ref 70–99)

## 2011-01-10 LAB — CBC
HCT: 23.5 % — ABNORMAL LOW (ref 36.0–46.0)
HCT: 25.8 % — ABNORMAL LOW (ref 36.0–46.0)
Hemoglobin: 8.1 g/dL — ABNORMAL LOW (ref 12.0–15.0)
Hemoglobin: 8.9 g/dL — ABNORMAL LOW (ref 12.0–15.0)
Hemoglobin: 9.2 g/dL — ABNORMAL LOW (ref 12.0–15.0)
MCHC: 34.1 g/dL (ref 30.0–36.0)
MCHC: 34.5 g/dL (ref 30.0–36.0)
MCV: 91.2 fL (ref 78.0–100.0)
MCV: 92 fL (ref 78.0–100.0)
MCV: 92.7 fL (ref 78.0–100.0)
MCV: 93.1 fL (ref 78.0–100.0)
MCV: 93.3 fL (ref 78.0–100.0)
Platelets: 184 10*3/uL (ref 150–400)
RBC: 2.69 MIL/uL — ABNORMAL LOW (ref 3.87–5.11)
RBC: 2.75 MIL/uL — ABNORMAL LOW (ref 3.87–5.11)
RBC: 2.82 MIL/uL — ABNORMAL LOW (ref 3.87–5.11)
RDW: 13.7 % (ref 11.5–15.5)
WBC: 5.4 10*3/uL (ref 4.0–10.5)
WBC: 5.6 10*3/uL (ref 4.0–10.5)
WBC: 8.2 10*3/uL (ref 4.0–10.5)

## 2011-01-10 LAB — CK: Total CK: 90 U/L (ref 7–177)

## 2011-01-10 LAB — DIFFERENTIAL
Basophils Absolute: 0 10*3/uL (ref 0.0–0.1)
Basophils Relative: 0 % (ref 0–1)
Eosinophils Relative: 2 % (ref 0–5)
Lymphocytes Relative: 10 % — ABNORMAL LOW (ref 12–46)
Neutro Abs: 6.5 10*3/uL (ref 1.7–7.7)

## 2011-01-10 LAB — COMPREHENSIVE METABOLIC PANEL
BUN: 115 mg/dL — ABNORMAL HIGH (ref 6–23)
CO2: 31 mEq/L (ref 19–32)
Chloride: 94 mEq/L — ABNORMAL LOW (ref 96–112)
Creatinine, Ser: 4.09 mg/dL — ABNORMAL HIGH (ref 0.4–1.2)
GFR calc non Af Amer: 11 mL/min — ABNORMAL LOW (ref >60)
Total Bilirubin: 0.5 mg/dL (ref 0.3–1.2)

## 2011-01-10 LAB — CROSSMATCH: Antibody Screen: NEGATIVE

## 2011-01-10 LAB — CARDIAC PANEL(CRET KIN+CKTOT+MB+TROPI)
Relative Index: INVALID (ref 0.0–2.5)
Total CK: 88 U/L (ref 7–177)

## 2011-01-10 LAB — FERRITIN: Ferritin: 97 ng/mL (ref 10–291)

## 2011-01-10 LAB — IRON AND TIBC: Iron: 41 ug/dL — ABNORMAL LOW (ref 42–135)

## 2011-01-10 LAB — CREATININE, URINE, RANDOM: Creatinine, Urine: 54.9 mg/dL

## 2011-01-10 LAB — TROPONIN I: Troponin I: 0.03 ng/mL (ref 0.00–0.06)

## 2011-01-10 LAB — POCT CARDIAC MARKERS
CKMB, poc: <1 ng/mL — ABNORMAL LOW (ref 1.0–8.0)
Troponin i, poc: <0.05 ng/mL (ref 0.00–0.09)

## 2011-01-10 LAB — HEMOGLOBIN A1C: Mean Plasma Glucose: 174 mg/dL

## 2011-01-10 LAB — URINALYSIS, ROUTINE W REFLEX MICROSCOPIC
Bilirubin Urine: NEGATIVE
Glucose, UA: NEGATIVE mg/dL
Hgb urine dipstick: NEGATIVE
Specific Gravity, Urine: 1.011 (ref 1.005–1.030)
pH: 5 (ref 5.0–8.0)

## 2011-01-10 LAB — MRSA PCR SCREENING: MRSA by PCR: POSITIVE — AB

## 2011-01-10 LAB — RETICULOCYTES
RBC.: 2.87 MIL/uL — ABNORMAL LOW (ref 3.87–5.11)
Retic Count, Absolute: 43.1 10*3/uL (ref 19.0–186.0)
Retic Ct Pct: 1.5 % (ref 0.4–3.1)

## 2011-01-10 LAB — FOLATE RBC: RBC Folate: 1672 ng/mL — ABNORMAL HIGH (ref 180–600)

## 2011-01-11 ENCOUNTER — Other Ambulatory Visit: Payer: Self-pay | Admitting: Internal Medicine

## 2011-01-11 ENCOUNTER — Other Ambulatory Visit (HOSPITAL_COMMUNITY): Payer: Self-pay

## 2011-01-11 ENCOUNTER — Ambulatory Visit (INDEPENDENT_AMBULATORY_CARE_PROVIDER_SITE_OTHER): Payer: Medicare Other | Admitting: Internal Medicine

## 2011-01-11 ENCOUNTER — Encounter: Payer: Self-pay | Admitting: Internal Medicine

## 2011-01-11 VITALS — BP 138/64 | HR 65 | Resp 18

## 2011-01-11 DIAGNOSIS — J984 Other disorders of lung: Secondary | ICD-10-CM

## 2011-01-11 DIAGNOSIS — I5032 Chronic diastolic (congestive) heart failure: Secondary | ICD-10-CM

## 2011-01-11 DIAGNOSIS — I2789 Other specified pulmonary heart diseases: Secondary | ICD-10-CM

## 2011-01-11 DIAGNOSIS — I509 Heart failure, unspecified: Secondary | ICD-10-CM

## 2011-01-11 DIAGNOSIS — R0600 Dyspnea, unspecified: Secondary | ICD-10-CM | POA: Insufficient documentation

## 2011-01-11 NOTE — Progress Notes (Signed)
HPI: [name] is a 65 y/o woman with multiple medical problems including CAD s/p CABG, DM2, obesity, fibromyalgia and diastolic HF. Referred by Dr. Diona [name] for further evaluation of pulmonary hypertension.  I have reviewed her chart extensively. Salient points:  CABG 1997, Stents 2007  RHC 9/10: Bypass graft patent. RA 24 PA 110/31 (62) PCWP 34 Fick 3.3/1.8 PVR 8.5. Symptoms improved somewhat with diuresis.  CT chest 10/10: No PE or ILD PFTs 10/10: Restrictive lung disease FEV1 1.01L (56%) FVC 1.2 (48%) DLCO 24% Rheum work-up which was negative. Sleep study 11/10: negative for OSA  Saw Dr. Delton [name] who felt PH was multifactorial but  Felt trial of Revatio was warranted. 2/11: started revatio. No apparent benefit. So it was stopped. Was hospitalized for respiratory failure and in 4/11: revatio restarted  Saw Dr. Diona [name] in February 2012. Feeling pretty well at the time. Edema well controlled and able to get around the house. However, over past month notes she is much more SOB. Hard to do anything. Continues to wear O2 3L by [place]. When goes to bathroom, sats usually in high 80s then comes up slowly. Denies edema. Weight actually decreasing.(160 this am at home. Was 171 here in December 2011) No CP. Using inhalers as needed. No significant wheezing. No cough. Occasional dizziness if gets up past. No syncope. Has not been to pulm rehab. Never smoked.   Also having problems with chronic nausea. Had EGD in  which was normal. Also limited by back and hip problems.     ROS: All other systems normal except as mentioned in HPI, past medical history and problem list.    Past Medical History  Diagnosis Date  . Fibromyalgia   . Diabetes mellitus   . Hypercholesterolemia   . Hypertension     pulmonary  pap 110/31 (mean 62)by RHC 9/10 negative PE by chest CT 2010  . Pulmonary edema     Failed Revatio   . ARF (acute renal failure)     poor candidate for ACE -I or ARB  . Restrictive lung  disease     obesity hypoventilation syndrome  . Renal insufficiency   . CAD (coronary artery disease)     multivessel DES x 2 RCA 2007  . Chronic diastolic heart failure   . MRSA (methicillin resistant staphylococcus aureus) pneumonia     Current Outpatient Prescriptions  Medication Sig Dispense Refill  . ALBUTEROL, 5 MG/ML, CONTINUOUS INHALATION SOLN Inhale 1 puff into the lungs.        . ALPRAZolam (XANAX) 0.5 MG tablet Take 0.5 mg by mouth at bedtime as needed.        Marland Kitchen aspirin 81 MG EC tablet Take 81 mg by mouth daily.        . cetirizine (ZYRTEC) 10 MG tablet Take 10 mg by mouth daily.        . DULoxetine (CYMBALTA) 60 MG capsule Take 60 mg by mouth daily.        Marland Kitchen ezetimibe-simvastatin (VYTORIN) 10-40 MG per tablet Take 1 tablet by mouth at bedtime.        . furosemide (LASIX) 40 MG tablet Take 2 tabs am 1 tab pm      . HYDROmorphone (DILAUDID) 4 MG tablet Take 4 mg by mouth every 4 (four) hours as needed.        . insulin glargine (LANTUS) 100 UNIT/ML injection Inject into the skin. Sliding scale       . isosorbide mononitrate (IMDUR) 30 MG CR tablet  Take 30 mg by mouth every morning.        Marland Kitchen L-Methylfolate-B6-B12 (METANX) 2.8-25-2 MG TABS Take by mouth as needed.        Marland Kitchen levothyroxine (SYNTHROID, LEVOTHROID) 25 MCG tablet Take 25 mcg by mouth daily.        . metoprolol (TOPROL-XL) 100 MG 24 hr tablet Take 100 mg by mouth daily.        Marland Kitchen omeprazole (PRILOSEC) 20 MG capsule Take 20 mg by mouth 2 (two) times daily.        . ondansetron (ZOFRAN) 4 MG tablet Take 4 mg by mouth every 8 (eight) hours as needed.        . Oxygen Perm[employer] SOLN by Does not apply route as needed. 3 liters       . potassium chloride (K-DUR) 10 MEQ tablet Take 10 mEq by mouth daily.        Marland Kitchen Spacer/Aero-Holding Chambers (AEROCHAMBER MAX W/FLOW-VU) MISC by Does not apply route.        . traMADol (ULTRAM) 50 MG tablet Take 50 mg by mouth every 6 (six) hours as needed.        Marland Kitchen DISCONTD:  isosorbide dinitrate (ISORDIL) 30 MG tablet Take 30 mg by mouth daily.        Marland Kitchen DISCONTD: pentazocine-acetaminophen (TALACEN) 25-650 MG TABS Take 1 tablet by mouth every 4 (four) hours as needed.           Allergies  Allergen Reactions  . Aspirin     REACTION: Intolerance  . Codeine   . Niacin   . Sulfonamide Derivatives     History   Social History  . Marital Status: Married    Spouse Name: N/A    Number of Children: N/A  . Years of Education: N/A   Occupational History  . Not on file.   Social History Main Topics  . Smoking status: Never Smoker   . Smokeless tobacco: Not on file  . Alcohol Use: No  . Drug Use: No  . Sexually Active:    Other Topics Concern  . Not on file   Social History Narrative  . No narrative on file    Family History  Problem Relation Age of Onset  . Cancer Mother     oral  . Heart attack Father   . COPD Brother   . Heart disease Brother     PHYSICAL EXAM: Filed Vitals:   01/11/11 1454  BP: 138/64  Pulse: 65  Resp: 18  General:  Mildly chronically ill-appearing. Wearing O2 HEENT: normal Neck: thick . no JVD. Carotids 2+ bilat; no bruits. No lymphadenopathy or thryomegaly appreciated. Cor: PMI nonpalpable Regular rate & rhythm. No rubs, gallops or murmurs. Lungs: clear but decreased throughout no wheezing Abdomen: obese. soft, nontender, nondistended. No hepatosplenomegaly. No bruits or masses. Good bowel sounds. Extremities: no cyanosis, clubbing, rash, edema Neuro: alert & oriented x 3, cranial nerves grossly intact. moves all 4 extremities w/o difficulty. Affect pleasant.  ECG:  SR 65. Minimal ST scooping. No signs of RV strain.   ASSESSMENT & PLAN:

## 2011-01-11 NOTE — Assessment & Plan Note (Addendum)
She has had a very thorough work-up to date which has shown mixed PH with PA pressures out of proportion to L sided pressures in the setting of diastolic HF and restrictive lung disease. Failed two trials of sildenafil (which I feel was a very good first line choice). Currently volume status looks good though she has progressive with NYHA IIIB symptoms. At this point I have suggested we repeat her RHC (to assess PA pressures and look for shunts) and also Chest CT with high resolutions cuts to evaluate for ILD or PVOD. If RHC shows persistent PAH first line therapy will be to ensure adequate oxygenation and then may consider inhaled Ventavis. Will also refer to pulmonary rehab in [place].   Total time reviewing extensive chart and discussing with patient in husband > 1 hour.

## 2011-01-11 NOTE — Assessment & Plan Note (Signed)
As above.

## 2011-01-11 NOTE — Patient Instructions (Signed)
Your physician recommends that you schedule a follow-up appointment in: 58month with Arvilla Meres, MD Non-Cardiac CT scanning, (CAT scanning), is a noninvasive, special x-ray that produces cross-sectional images of the body using x-rays and a computer. CT scans help physicians diagnose and treat medical conditions. For some CT exams, a contrast material is used to enhance visibility in the area of the body being studied. CT scans provide greater clarity and reveal more details than regular x-ray exams.  Your physician has requested that you have a cardiac catheterization. Cardiac catheterization is used to diagnose and/or treat various heart conditions. Doctors may recommend this procedure for a number of different reasons. The most common reason is to evaluate chest pain. Chest pain can be a symptom of coronary artery disease (CAD), and cardiac catheterization can show whether plaque is narrowing or blocking your heart's arteries. This procedure is also used to evaluate the valves, as well as measure the blood flow and oxygen levels in different parts of your heart. For further information please visit [email or web address] Please follow instruction sheet, as given. YOUR CATH IS SCHEDULED AT [facility] FOR  Friday 01/19/11 AT 8:30 AM

## 2011-01-12 LAB — PROTIME-INR
INR: 1.1 ratio — ABNORMAL HIGH (ref 0.8–1.0)
Prothrombin Time: 12 s (ref 10.2–12.4)

## 2011-01-12 LAB — CBC WITH DIFFERENTIAL/PLATELET
Basophils Relative: 0.1 % (ref 0.0–3.0)
Eosinophils Absolute: 0.1 10*3/uL (ref 0.0–0.7)
Eosinophils Relative: 0.7 % (ref 0.0–5.0)
Lymphocytes Relative: 6.3 % — ABNORMAL LOW (ref 12.0–46.0)
MCHC: 33.3 g/dL (ref 30.0–36.0)
Monocytes Relative: 4.9 % (ref 3.0–12.0)
Neutrophils Relative %: 88 % — ABNORMAL HIGH (ref 43.0–77.0)
RBC: 3.9 Mil/uL (ref 3.87–5.11)
WBC: 16.3 10*3/uL — ABNORMAL HIGH (ref 4.5–10.5)

## 2011-01-12 LAB — BASIC METABOLIC PANEL
BUN: 11 mg/dL (ref 6–23)
CO2: 39 mEq/L — ABNORMAL HIGH (ref 19–32)
Chloride: 95 mEq/L — ABNORMAL LOW (ref 96–112)
Potassium: 3.8 mEq/L (ref 3.5–5.1)

## 2011-01-15 ENCOUNTER — Encounter: Payer: Self-pay | Admitting: Internal Medicine

## 2011-01-17 ENCOUNTER — Ambulatory Visit (INDEPENDENT_AMBULATORY_CARE_PROVIDER_SITE_OTHER)
Admission: RE | Admit: 2011-01-17 | Discharge: 2011-01-17 | Disposition: A | Discharge status: Home / Self Care | Payer: Medicare Other | Source: Ambulatory Visit | Attending: Internal Medicine | Admitting: Internal Medicine

## 2011-01-17 DIAGNOSIS — I509 Heart failure, unspecified: Secondary | ICD-10-CM

## 2011-01-17 DIAGNOSIS — I5032 Chronic diastolic (congestive) heart failure: Secondary | ICD-10-CM

## 2011-01-17 DIAGNOSIS — J984 Other disorders of lung: Secondary | ICD-10-CM

## 2011-01-19 ENCOUNTER — Inpatient Hospital Stay (HOSPITAL_BASED_OUTPATIENT_CLINIC_OR_DEPARTMENT_OTHER)
Admission: RE | Admit: 2011-01-19 | Discharge: 2011-01-19 | Disposition: A | Discharge status: Home / Self Care | Payer: Medicare Other | Source: Ambulatory Visit | Attending: Internal Medicine | Admitting: Internal Medicine

## 2011-01-19 DIAGNOSIS — IMO0001 Reserved for inherently not codable concepts without codable children: Secondary | ICD-10-CM | POA: Insufficient documentation

## 2011-01-19 DIAGNOSIS — I279 Pulmonary heart disease, unspecified: Secondary | ICD-10-CM

## 2011-01-19 DIAGNOSIS — E669 Obesity, unspecified: Secondary | ICD-10-CM | POA: Insufficient documentation

## 2011-01-19 DIAGNOSIS — I2699 Other pulmonary embolism without acute cor pulmonale: Secondary | ICD-10-CM

## 2011-01-19 DIAGNOSIS — I2789 Other specified pulmonary heart diseases: Secondary | ICD-10-CM | POA: Insufficient documentation

## 2011-01-19 DIAGNOSIS — I2510 Atherosclerotic heart disease of native coronary artery without angina pectoris: Secondary | ICD-10-CM | POA: Insufficient documentation

## 2011-01-19 DIAGNOSIS — R0609 Other forms of dyspnea: Secondary | ICD-10-CM | POA: Insufficient documentation

## 2011-01-19 DIAGNOSIS — Z951 Presence of aortocoronary bypass graft: Secondary | ICD-10-CM | POA: Insufficient documentation

## 2011-01-19 DIAGNOSIS — I5030 Unspecified diastolic (congestive) heart failure: Secondary | ICD-10-CM | POA: Insufficient documentation

## 2011-01-19 DIAGNOSIS — R0989 Other specified symptoms and signs involving the circulatory and respiratory systems: Secondary | ICD-10-CM | POA: Insufficient documentation

## 2011-01-19 DIAGNOSIS — E119 Type 2 diabetes mellitus without complications: Secondary | ICD-10-CM | POA: Insufficient documentation

## 2011-01-22 ENCOUNTER — Other Ambulatory Visit: Payer: Self-pay | Admitting: Internal Medicine

## 2011-01-22 ENCOUNTER — Ambulatory Visit: Payer: Medicare Other | Admitting: Cardiology

## 2011-01-22 ENCOUNTER — Encounter (HOSPITAL_COMMUNITY)
Admit: 2011-01-22 | Discharge: 2011-01-22 | Disposition: A | Discharge status: Home / Self Care | Payer: Medicare Other | Attending: Internal Medicine | Admitting: Internal Medicine

## 2011-01-22 ENCOUNTER — Ambulatory Visit (HOSPITAL_COMMUNITY)
Admission: RE | Admit: 2011-01-22 | Discharge: 2011-01-22 | Disposition: A | Discharge status: Home / Self Care | Payer: Medicare Other | Source: Ambulatory Visit | Attending: Internal Medicine | Admitting: Internal Medicine

## 2011-01-22 DIAGNOSIS — I2699 Other pulmonary embolism without acute cor pulmonale: Secondary | ICD-10-CM

## 2011-01-22 DIAGNOSIS — I517 Cardiomegaly: Secondary | ICD-10-CM | POA: Insufficient documentation

## 2011-01-22 DIAGNOSIS — R0602 Shortness of breath: Secondary | ICD-10-CM | POA: Insufficient documentation

## 2011-01-22 DIAGNOSIS — E119 Type 2 diabetes mellitus without complications: Secondary | ICD-10-CM | POA: Insufficient documentation

## 2011-01-22 DIAGNOSIS — I10 Essential (primary) hypertension: Secondary | ICD-10-CM | POA: Insufficient documentation

## 2011-01-22 MED ORDER — XENON XE 133 GAS: 10.0000 | GAS_FOR_INHALATION | Freq: Once | RESPIRATORY_TRACT | Status: DC | PRN

## 2011-01-22 MED ORDER — XENON XE 133 GAS
10.0000 | GAS_FOR_INHALATION | Freq: Once | RESPIRATORY_TRACT | Status: AC | PRN
  Administered 2011-01-22: 10 via RESPIRATORY_TRACT

## 2011-01-22 MED ORDER — TECHNETIUM TO 99M ALBUMIN AGGREGATED
6.0000 | Freq: Once | INTRAVENOUS | Status: AC | PRN
  Administered 2011-01-22: 6 via INTRAVENOUS

## 2011-01-23 ENCOUNTER — Ambulatory Visit: Payer: Medicare Other | Admitting: Cardiology

## 2011-01-26 LAB — DIFFERENTIAL
Basophils Absolute: 0.1 10*3/uL (ref 0.0–0.1)
Eosinophils Relative: 2 % (ref 0–5)
Lymphocytes Relative: 9 % — ABNORMAL LOW (ref 12–46)
Lymphs Abs: 1 10*3/uL (ref 0.7–4.0)
Neutro Abs: 8.3 10*3/uL — ABNORMAL HIGH (ref 1.7–7.7)
Neutrophils Relative %: 82 % — ABNORMAL HIGH (ref 43–77)

## 2011-01-26 LAB — POCT I-STAT 3, ART BLOOD GAS (G3+)
Acid-Base Excess: 3 mmol/L — ABNORMAL HIGH (ref 0.0–2.0)
Acid-Base Excess: 3 mmol/L — ABNORMAL HIGH (ref 0.0–2.0)
Bicarbonate: 39.4 mEq/L — ABNORMAL HIGH (ref 20.0–24.0)
Bicarbonate: 29.8 mEq/L — ABNORMAL HIGH (ref 20.0–24.0)
Bicarbonate: 30.1 mEq/L — ABNORMAL HIGH (ref 20.0–24.0)
O2 Saturation: 55 %
O2 Saturation: 96 %
TCO2: 24 mmol/L (ref 0–100)
pCO2 arterial: 58.5 mmHg (ref 35.0–45.0)
pCO2 arterial: 54.2 mmHg — ABNORMAL HIGH (ref 35.0–45.0)
pCO2 arterial: 59 mmHg (ref 35.0–45.0)
pH, Arterial: 7.179 — CL (ref 7.350–7.400)
pH, Arterial: 7.441 — ABNORMAL HIGH (ref 7.350–7.400)
pO2, Arterial: 73 mmHg — ABNORMAL LOW (ref 80.0–100.0)
pO2, Arterial: 32 mmHg — CL (ref 80.0–100.0)
pO2, Arterial: 89 mmHg (ref 80.0–100.0)

## 2011-01-26 LAB — POCT I-STAT 3, VENOUS BLOOD GAS (G3P V)
Bicarbonate: 33.6 mEq/L — ABNORMAL HIGH (ref 20.0–24.0)
pCO2, Ven: 62.8 mmHg — ABNORMAL HIGH (ref 45.0–50.0)
pH, Ven: 7.368 — ABNORMAL HIGH (ref 7.250–7.300)
pH, Ven: 7.394 — ABNORMAL HIGH (ref 7.250–7.300)
pO2, Ven: 28 mmHg — CL (ref 30.0–45.0)

## 2011-01-26 LAB — COMPREHENSIVE METABOLIC PANEL
AST: 20 U/L (ref 0–37)
BUN: 33 mg/dL — ABNORMAL HIGH (ref 6–23)
CO2: 27 mEq/L (ref 19–32)
Calcium: 9.3 mg/dL (ref 8.4–10.5)
Chloride: 104 mEq/L (ref 96–112)
Creatinine, Ser: 1.18 mg/dL (ref 0.4–1.2)
GFR calc Af Amer: 56 mL/min — ABNORMAL LOW (ref >60)
GFR calc non Af Amer: 46 mL/min — ABNORMAL LOW (ref >60)
Total Bilirubin: 0.6 mg/dL (ref 0.3–1.2)

## 2011-01-26 LAB — CBC
HCT: 35.2 % — ABNORMAL LOW (ref 36.0–46.0)
MCHC: 34.7 g/dL (ref 30.0–36.0)
MCV: 90.3 fL (ref 78.0–100.0)
RBC: 3.9 MIL/uL (ref 3.87–5.11)

## 2011-01-26 LAB — POCT CARDIAC MARKERS
CKMB, poc: 1 ng/mL (ref 1.0–8.0)
Myoglobin, poc: 84 ng/mL (ref 12–200)
Troponin i, poc: <0.05 ng/mL (ref 0.00–0.09)

## 2011-01-26 LAB — POCT I-STAT GLUCOSE: Glucose, Bld: 142 mg/dL — ABNORMAL HIGH (ref 70–99)

## 2011-02-05 LAB — CBC
MCV: 92 fL (ref 78.0–100.0)
Platelets: 237 10*3/uL (ref 150–400)
RBC: 4.18 MIL/uL (ref 3.87–5.11)
WBC: 13.8 10*3/uL — ABNORMAL HIGH (ref 4.0–10.5)

## 2011-02-05 LAB — DIFFERENTIAL
Basophils Relative: 0 % (ref 0–1)
Eosinophils Absolute: 0 10*3/uL (ref 0.0–0.7)
Lymphs Abs: 1.7 10*3/uL (ref 0.7–4.0)
Monocytes Relative: 9 % (ref 3–12)
Neutro Abs: 10.8 10*3/uL — ABNORMAL HIGH (ref 1.7–7.7)
Neutrophils Relative %: 79 % — ABNORMAL HIGH (ref 43–77)

## 2011-02-05 LAB — PROTIME-INR
INR: 0.9 (ref 0.00–1.49)
Prothrombin Time: 12.4 seconds (ref 11.6–15.2)

## 2011-02-05 LAB — BASIC METABOLIC PANEL
BUN: 29 mg/dL — ABNORMAL HIGH (ref 6–23)
Calcium: 9.1 mg/dL (ref 8.4–10.5)
Chloride: 93 mEq/L — ABNORMAL LOW (ref 96–112)
Creatinine, Ser: 1.19 mg/dL (ref 0.4–1.2)
GFR calc Af Amer: 55 mL/min — ABNORMAL LOW (ref >60)

## 2011-02-05 LAB — SAMPLE TO BLOOD BANK

## 2011-02-05 LAB — APTT: aPTT: 21 seconds — ABNORMAL LOW (ref 24–37)

## 2011-02-14 ENCOUNTER — Encounter: Payer: Self-pay | Admitting: Internal Medicine

## 2011-02-14 ENCOUNTER — Ambulatory Visit: Payer: Medicare Other | Admitting: Internal Medicine

## 2011-02-15 ENCOUNTER — Ambulatory Visit (INDEPENDENT_AMBULATORY_CARE_PROVIDER_SITE_OTHER): Payer: Medicare Other | Admitting: Internal Medicine

## 2011-02-15 ENCOUNTER — Encounter: Payer: Self-pay | Admitting: Internal Medicine

## 2011-02-15 VITALS — BP 129/66 | HR 61 | Resp 16 | Ht 60.0 in | Wt 162.0 lb

## 2011-02-15 DIAGNOSIS — I2510 Atherosclerotic heart disease of native coronary artery without angina pectoris: Secondary | ICD-10-CM

## 2011-02-15 DIAGNOSIS — I2789 Other specified pulmonary heart diseases: Secondary | ICD-10-CM

## 2011-02-15 NOTE — Patient Instructions (Signed)
You have been referred to Dr Bland Span at [facility], they will call you to schedule appointment Your physician recommends that you schedule a follow-up appointment in: 4 months.

## 2011-02-15 NOTE — Assessment & Plan Note (Signed)
Has severe PAH with cor pulmonale. Will refer to Dr. Bland Span at [facility] for consideration of [place] or [facility].

## 2011-02-15 NOTE — Progress Notes (Signed)
HPI: Meagan [name] is a 65 y/o woman with multiple medical problems including CAD s/p CABG, DM2, obesity, fibromyalgia and diastolic HF. Referred by Dr. Diona Browner for further evaluation of pulmonary hypertension.  CABG 1997, Stents 2007  RHC 9/10: Bypass graft patent. RA 24 PA 110/31 (62) PCWP 34 Fick 3.3/1.8 PVR 8.5. Symptoms improved somewhat with diuresis.  CT chest 10/10: No PE or ILD PFTs 10/10: Restrictive lung disease FEV1 1.01L (56%) FVC 1.2 (48%) DLCO 24% Rheum work-up which was negative. Sleep study 11/10: negative for OSA  Saw Dr. Delton Coombes who felt PH was multifactorial but  Felt trial of Revatio was warranted. 2/11: started revatio. No apparent benefit. So it was stopped. Was hospitalized for respiratory failure and in 4/11: revatio restarted but subsequently stopped as it wasn't helping.   Had RHC 01/19/11: RA 12, RV 101/1/22 PA 99/19 (50) PCWP 18 Fick cardiac output 2.6, cardiac index of 1.5.  PVR 12.3  Femoral artery saturation was 94%.  PA saturation was 50% and 56% with a mean of 53.     Pulmonary angio of the right pulmonary artery showed a relatively normal angiogram except for question of a small cut off in sub-branch in the right lower lobe.  On the left, pulmonary artery appeared to have normal anatomy, no evidence of acute cut off or significant pruning. Plan was to refer to Dr. Bland Span at [facility] for consideration of IV prostaglandins.  Returns for routine f/u. Remains markedly SOB on any exertion. No edema. Occasional lightheadedness. +PND. No cough. Struggling with back pain. Has lost 30 pounds over past year. No appetite.   ROS: All other systems normal except as mentioned in HPI, past medical history and problem list.    Past Medical History  Diagnosis Date  . Fibromyalgia   . Diabetes mellitus   . Hypercholesterolemia   . Hypertension     pulmonary  pap 110/31 (mean 62)by RHC 9/10 negative PE by chest CT 2010  . Pulmonary edema     Failed Revatio   . ARF (acute renal  failure)     poor candidate for ACE -I or ARB  . Restrictive lung disease     obesity hypoventilation syndrome  . Renal insufficiency   . CAD (coronary artery disease)     multivessel DES x 2 RCA 2007  . Chronic diastolic heart failure   . MRSA (methicillin resistant staphylococcus aureus) pneumonia     Current Outpatient Prescriptions  Medication Sig Dispense Refill  . ALBUTEROL, 5 MG/ML, CONTINUOUS INHALATION SOLN Inhale 1 puff into the lungs.        . ALPRAZolam (XANAX) 0.5 MG tablet Take 0.5 mg by mouth at bedtime as needed.        Marland Kitchen aspirin 81 MG EC tablet Take 81 mg by mouth daily.        . cetirizine (ZYRTEC) 10 MG tablet Take 10 mg by mouth daily.        . DULoxetine (CYMBALTA) 60 MG capsule Take 60 mg by mouth daily.        Marland Kitchen ezetimibe-simvastatin (VYTORIN) 10-40 MG per tablet Take 1 tablet by mouth at bedtime.        . fentaNYL (DURAGESIC - DOSED MCG/HR) 25 MCG/HR Place 1 patch onto the skin every 3 (three) days.        . furosemide (LASIX) 40 MG tablet Take 2 tabs am 1 tab pm      . HYDROmorphone (DILAUDID) 4 MG tablet Take 4 mg by mouth every  4 (four) hours as needed.        . insulin glargine (LANTUS) 100 UNIT/ML injection Inject into the skin. Sliding scale       . isosorbide mononitrate (IMDUR) 30 MG CR tablet Take 30 mg by mouth every morning.        Marland Kitchen L-Methylfolate-B6-B12 (METANX) 2.8-25-2 MG TABS Take by mouth as needed.        Marland Kitchen levothyroxine (SYNTHROID, LEVOTHROID) 25 MCG tablet Take 25 mcg by mouth daily.        . metoprolol (TOPROL-XL) 100 MG 24 hr tablet Take 100 mg by mouth daily.        Marland Kitchen omeprazole (PRILOSEC) 20 MG capsule Take 20 mg by mouth 2 (two) times daily.        . ondansetron (ZOFRAN) 4 MG tablet Take 4 mg by mouth every 8 (eight) hours as needed.        . Oxygen Permeable Lens Products SOLN by Does not apply route as needed. 3 liters       . PENTAZOCINE-NALOXONE PO as needed.        . potassium chloride (K-DUR) 10 MEQ tablet Take 10 mEq by mouth  daily.        Marland Kitchen Spacer/Aero-Holding Chambers (AEROCHAMBER MAX W/FLOW-VU) MISC by Does not apply route.        . traMADol (ULTRAM) 50 MG tablet Take 50 mg by mouth every 6 (six) hours as needed.        Marland Kitchen DISCONTD: potassium chloride (K-DUR) 10 MEQ tablet Take 10 mEq by mouth daily.           Allergies  Allergen Reactions  . Aspirin     REACTION: Intolerance  . Codeine   . Niacin   . Sulfonamide Derivatives     History   Social History  . Marital Status: Married    Spouse Name: N/A    Number of Children: N/A  . Years of Education: N/A   Occupational History  . Not on file.   Social History Main Topics  . Smoking status: Never Smoker   . Smokeless tobacco: Not on file  . Alcohol Use: No  . Drug Use: No  . Sexually Active:    Other Topics Concern  . Not on file   Social History Narrative  . No narrative on file    Family History  Problem Relation Age of Onset  . Cancer Mother     oral  . Heart attack Father   . COPD Brother   . Heart disease Brother     PHYSICAL EXAM: Filed Vitals:   02/15/11 1617  BP: 129/66  Pulse: 61  Resp: 16  General:  Mildly chronically ill-appearing. Wearing O2 HEENT: normal Neck: thick . no JVD. Carotids 2+ bilat; no bruits. No lymphadenopathy or thryomegaly appreciated. Cor: PMI nonpalpable Regular rate & rhythm. No rubs, gallops or murmurs. P2 perhaps mildly accentuated but not severely so Lungs: clear but decreased throughout no wheezing Abdomen: obese. soft, nontender, nondistended. No hepatosplenomegaly. No bruits or masses. Good bowel sounds. Extremities: no cyanosis, clubbing, rash, edema Neuro: alert & oriented x 3, cranial nerves grossly intact. moves all 4 extremities w/o difficulty. Affect pleasant.   ASSESSMENT & PLAN:

## 2011-02-15 NOTE — Assessment & Plan Note (Signed)
No evidence of ischemia. Continue current regimen. Ok to come off ASA for a few days for back injection, as needed.

## 2011-02-20 NOTE — Cardiovascular Report (Signed)
[name], [name]              ACCOUNT NO.:  [id number]  MEDICAL RECORD NO.:  [id number]          PATIENT TYPE:  OUT  LOCATION:  RAD                           FACILITY:  [facility]  PHYSICIAN:  Bevelyn Buckles. Anirudh Baiz, MDDATE OF BIRTH:  1945-11-27  DATE OF PROCEDURE:  01/19/2011 DATE OF DISCHARGE:  10/20/2010                           CARDIAC CATHETERIZATION   REFERRING PHYSICIAN:  Jonelle Sidle, MD  PATIENT IDENTIFICATION:  [name] is a 65 year old woman with multiple medical problems including coronary artery disease status post bypass surgery, heart failure secondary to diastolic dysfunction, diabetes, obesity and fibromyalgia.  She has had severe problems with dyspnea and previously underwent a right heart cath which showed severe pulmonary hypertension with PA pressures of 110/30.  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  She previously has been tried on sildenafil but has failed this therapy twice due to lack of benefit.  She was recently referred to me for further evaluation.  We did a CT scan which did not show any significant interstitial lung disease or acute pulmonary embolus.  However, there was some question of a mosaic pattern which raised the question of chronic pulmonary embolus.  She is brought today for right heart cath and pulmonary angiogram.  DESCRIPTION OF PROCEDURE:  The risks and indication were explained. Consent was signed and placed in the chart.  The right groin area was prepped and draped in routine sterile fashion, anesthetized with 1% local lidocaine.  We initially performed a right heart catheterization with standard Swan-Ganz catheter.  We then switched catheter over a long 0.025 wire for a straight pigtail.  Pulmonary angiograms were performed with two injections.  There were no apparent complications.  FINDINGS:  Right atrial pressure mean of 12, RV pressure 101/1, EDP  of 22, PA pressure 99/19 with a mean of 50, pulmonary capillary wedge pressure mean of 18.  Fick cardiac output 2.6, cardiac index of 1.5. Pulmonary vascular resistance was 12.3 Woods units.  Femoral artery saturation was 94%.  PA saturation was 50% and 56% with a mean of 53.  Pulmonary angio of the right pulmonary artery showed a relatively normal angiogram except for question of a small cut off branch in the right lower lobe.  On the left, pulmonary artery appeared to have normal anatomy, no evidence of acute cut off or significant pruning.  ASSESSMENT: 1. Severe pulmonary arterial hypertension with evidence of cor     pulmonale. 2. Relatively normal pulmonary angiogram except for a question of a     small abrupt cut off in one of the right lower lobe vessels.  PLAN/DISCUSSION:  She had severe pulmonary hypertension.  We will check a V/Q scan to rule out chronic pulmonary embolus but pulmonary angiogram is not suggestive of severe thromboembolic disease.  Given her severe pulmonary hypertension class III B symptoms and evidence of cor pulmonale, we will consider discussion with Dr. Carolee Rota at [facility] for consideration of possible Flolan therapy versus inhaled Ventavis.     Bevelyn Buckles. Tyonna Talerico, MD     DRB/MEDQ  D:  01/19/2011  T:  01/20/2011  Job:  [id number]  cc:   Carolee Rota, MD  Electronically Signed by Arvilla Meres MD on 02/20/2011 07:07:55 PM

## 2011-02-26 ENCOUNTER — Telehealth: Payer: Self-pay | Admitting: Internal Medicine

## 2011-02-26 NOTE — Telephone Encounter (Signed)
Last ov note faxed to Dr Eduard Clos at [phone], pt is aware

## 2011-02-26 NOTE — Telephone Encounter (Signed)
Pt needs sur clearance for back surgery. Pt states dr. Eduard Clos office # [phone] are waiting for clearance.

## 2011-03-06 NOTE — Assessment & Plan Note (Signed)
[facility]                            CARDIOLOGY OFFICE NOTE   NAME:Fine, MKAYLA STEELE                     MRN:          [id number]  DATE:03/25/2007                            DOB:          June 29, 1946    CLINICAL HISTORY:  Mio Schellinger is 65 years old and returns for follow-  up management of her coronary heart disease.  She had remote bypass  surgery and in early 2007 underwent placement of drug-eluding stents for  chronic total occluded right coronary artery.  She had  patent grafts to  the left side.  She had done quite well from that standpoint with no  recent chest pain, shortness of breath, or palpitations.   She recently has developed headaches and had a CT scan which is okay and  her blood pressure has been elevated and was quite elevated today.   PAST MEDICAL HISTORY:  Significant for hypertension, diabetes, and  fibromyalgia.  We thought that she had peripheral vascular disease and  she had borderline abnormal Doppler studies and underwent angiography  with Dr. Samule Ohm but she did not have any major obstruction.   CURRENT MEDICATIONS:  Zyrtec, Nexium, Cymbalta, Lyrica, glipizide,  Imdur, Nasonex, Plavix, Diovan, and Vytorin.   ON EXAMINATION:  VITAL SIGNS:  Blood pressure 170/70.  Pulse 62 and  regular.  NECK:  There is no vein distention.  The carotid pulses are full without  bruits.  CHEST:  Clear with decreased breath sounds.  CARDIAC:  Rhythm was regular.  I hear no murmurs or gallops.  ABDOMEN:  Soft.  No organomegaly.  EXTREMITIES:  Peripheral pulses are equal.  There is no pedal edema.   Electrocardiogram showed a sinus rhythm with a rate of 62 and was  normal.   IMPRESSION:  1. Coronary artery disease, status post coronary artery bypass graft      surgery and status post prior coronary interventions as described      above, now stable.  2. Good left ventricular function.  3. History of congestive heart failure related to  diastolic      dysfunction, now compensated.  4. Hypertension, not under good control.  5. Headaches.  6. Hyperlipidemia.  7. Diabetes.  8. Fibromyalgia.   RECOMMENDATIONS:  Ms. Sardo's systolic pressure is quite high and we  will start her on amlodipine 10 mg a day.  She plans to follow up with  Dr. Sherwood Gambler in [place] in a week or two.  I think her cardiac status is  stable and we will plan to see her back in six months for that.     Bruce Elvera Lennox Juanda Chance, MD, [facility]  Electronically Signed    BRB/MedQ  DD: 03/25/2007  DT: 03/25/2007  Job #: [id number]

## 2011-03-06 NOTE — Assessment & Plan Note (Signed)
[facility]                            CARDIOLOGY OFFICE NOTE   Meagan, [name]                     MRN:          [id number]  DATE:09/30/2007                            DOB:          11/22/1945    REFERRING PHYSICIAN:  Patrica Duel, M.D.   CARDIAC CONSULTATION   REASON FOR REFERRAL:  Evaluate cardiomegaly and possible congestive  heart failure.   CLINICAL HISTORY:  Meagan [name] is 65 years old and has had remote  bypass surgery in 2007 and placement of a tandem overlapping drug-  eluting stent for a chronically totally occluded right coronary artery.  Her grafts on the left side were patent.  She also has a history of  diastolic heart failure.   She was recently hospitalized at [facility] with a pneumonia, and an x-  ray showed cardiomegaly.  I believe there was some question of  congestive heart failure.  Because of these findings, she is referred  here for further evaluation.   She says she has had no recent chest pain or palpitations.  She does  have intermittent edema, but since she has been on the Lasix regularly  the last week or two, she says she has not had any edema.  She does have  chronic shortness of breath, but this has improved some recently.   Her past medical history is significant for hypertension, diabetes, and  fibromyalgia.  She also had peripheral vascular studies by Dr. Samule Ohm  which did not show any major obstruction.   Her current medications include Zyrtec, Nexium, Cymbalta, Toprol,  Vytorin, Plavix, Lyrica, Imdur, Diovan, Lasix, Glucotrol, Actos,  Sudafed, K-Dur, and hydrochlorothiazide.   SOCIAL HISTORY:  She does not smoke or drink.  She lives with her  husband.   FAMILY HISTORY:  Positive for both coronary artery disease and cancer.   REVIEW OF SYSTEMS:  Positive for lower extremity edema which has  improved recently.   PHYSICAL EXAMINATION:  Blood pressure is 101/60, pulse 62 and regular.  There was  no venous distention.  The carotid pulses were full.  There  were no bruits.  CHEST:  Clear without rales or rhonchi.  CARDIAC:  Chest was mostly clear, but there were minimal crackles at the  bases.  Cardiac rhythm was regular.  I could hear no murmurs or gallops.  ABDOMEN:  Protuberant.  There was no hepatosplenomegaly.  The bowel  sounds were normal.  The abdomen was soft.  There was no peripheral edema.  Pedal pulses were equal.  MUSCULOSKELETAL:  No deformities.  SKIN:  Warm and dry.  NEUROLOGIC:  No focal neurological signs.   A chest x-ray report that we had from [facility] showed cardiomegaly and  borderline pulmonary vascularity.   IMPRESSION:  1. Cardiomegaly and questionable congestive heart failure by chest x-      ray with a history of previous diastolic congestive heart failure.  2. Coronary artery disease, status post remote coronary artery bypass      graft surgery and status post percutaneous intervention with      chronically totally occluded right coronary artery  in 2007 with      drug-eluting stents.  3. Hypertension, now under good control.  4. Hyperlipidemia.  5. Diabetes.  6. Fibromyalgia.  7. Intolerance to ASPIRIN.  8. Intolerance to AMLODIPINE due to edema.   RECOMMENDATIONS:  Meagan [name] has a history of diastolic heart failure,  and I suspect that she may have had some recently, although she is now  better on daily Lasix.  We will plan to get an echocardiogram to re-  evaluate her LV systolic and diastolic function as well as her wall  thickness.  The last echocardiogram I have in our records here is from  2003, at which time she had normal LV function.  We will also get  laboratory studies, including a CBC, BMP, BNP, and TSH.  I will see her  back in a few weeks and will decide about any further change in her  therapy.     Bruce Elvera Lennox Juanda Chance, MD, [facility]  Electronically Signed    BRB/MedQ  DD: 09/30/2007  DT: 10/01/2007  Job #: [id number]

## 2011-03-06 NOTE — Assessment & Plan Note (Signed)
[facility]                            CARDIOLOGY OFFICE NOTE   [name], Meagan [name]                     MRN:          [id number]  DATE:07/23/2008                            DOB:          [birth year]    PRIMARY CARE PHYSICIAN:  Meagan Rear. Sherwood Gambler, MD in [place].   CLINICAL HISTORY:  Meagan [name] is 65 years old and returned for a  followup management of heart failure and also evaluation of cardiomegaly  reported on chest x-ray.  She had bypass surgery in 2007 and  subsequently at that time had placement of 10 overlapping drug-eluting  stents for a chronically totally occluded right coronary artery.  She  also has had a history of diastolic heart failure.  In August, she  developed increasing symptoms of shortness of breath, was seen by Dr.  Phillips Odor, who thought she might have flu as well as congestive heart  failure.  Her Lasix was increased to 80 mg a day for a week and then  back to 40 mg a day, and she improved.  She now takes her Lasix 40 mg a  day, and when she feels she is having increased shortness of breath and  swelling, she will take an extra 40.   PAST MEDICAL HISTORY:  Significant for hypertension, diabetes,  hyperlipidemia, and fibromyalgia.   CURRENT MEDICATIONS INCLUDE:  1. Zyrtec.  2. Nexium.  3. Cymbalta.  4. Toprol-XL 100 mg daily.  5. Vytorin 10/80 daily.  6. Plavix 75 mg daily.  7. Lyrica.  8. Imdur 30 mg daily.  9. Diovan 160 mg daily.  10.Lasix 40 mg daily.  11.Glucotrol.  12.Actos.  13.Januvia.  14.Albuterol tablets.   PHYSICAL EXAMINATION:  VITALS SIGNS:  The blood pressure is 122/60 and  the pulse 63 and regular.  NECK:  There was no venous distention.  Carotid pulses were full without  bruits.  CHEST:  Clear.  HEART:  Rhythm was regular.  I hear no murmurs or gallops.  ABDOMEN:  Soft with normal bowel sounds.  EXTREMITIES:  Peripheral pulse were full and there was trace peripheral  edema.   IMPRESSION:  1. Coronary artery disease status post coronary artery bypass graft      surgery in 2007 with subsequent percutaneous coronary intervention      with chronic totally occluded right coronary artery using the two      overlapping drug-eluting stents, now stable.  2. Diastolic heart failure with recent exacerbation, but now appears      to be compensated.  3. Hypertension.  4. Hyperlipidemia.  5. Diabetes.  6. Fibromyalgia.  7. Intolerance to aspirin.  8. Intolerance to amlodipine due to edema.   RECOMMENDATION:  I think, Meagan [name] is doing well at present.  She  appears to be euvolemic, and I agree with her current management with  Lasix 40 mg with p.r.n. for increased swelling or shortness of breath.  I told her from a preventive standpoint, the most important thing she  can do is to continue to watch salt in her diet very closely and to  weigh herself daily, so  that if she has any increase in her weight, she  can be evaluated to see if fluid is accumulating.  I also told her  control of her blood pressure is very important in keeping her away from  having recurrent congestive heart failure.  We will plan to evaluate her  cardiomegaly with an echocardiogram to reevaluate LV function and look  for LVH.  We will get a BMP, BNP, and CBC today, and I will see her back  in followup in a year.     Meagan Elvera Lennox Juanda Chance, MD, [facility]  Electronically Signed    BRB/MedQ  DD: 07/23/2008  DT: 07/23/2008  Job #: [id number]   cc:   Meagan Rear. Sherwood Gambler, MD

## 2011-03-06 NOTE — Assessment & Plan Note (Signed)
[facility]                                 ON-CALL NOTE   NAME:BAULDINGDarthula, Desa                     MRN:          [id number]  DATE:11/29/2009                            DOB:          Mar 23, 1946    On the evening of November 29, 2009, I received a page from [employer]  notifying me that Ms. Iglehart had had a critical value with BUN of 95  and noncritical value but elevated creatinine of 1.9.  Her BNP was 318,  which was higher than other values in the computer.  Previous high in  October 2010 was 244.  BUN had been elevated into the 90s before but  never quite as high as this and creatinine seems to run anywhere from  1.2-2.5.  I called the patient to discuss these labs values and to see  how she has been feeling and she reported that she had been slowly  gaining weight over the last several days/2 weeks and that her shortness  of breath had been increasing.  She is seen by home health nurse and her  dose of Lasix had been increased as well as the addition of another  diuretic, Zaroxolyn 2.5 mg p.o. daily.  The patient reports that she is  still putting out urine, but she feels that it is not quite as much as  she would expect given her increased diuretic use and she is frustrated  with her increasing shortness of breath.  I told the patient that should  she consider her symptoms severe enough at this point, she is more than  welcome to come to the hospital and we could admit her and remove her  volume with IV medicines.  She can also follow the instructions of her  home health nurse tonight and see how she felt in the morning after a  weight check and after close monitoring of her urine output.  The  patient opted to take her medicines tonight (Lasix 40 mg), monitor her  urine, and check her weight in the morning as well as be vigilant in  regards to her symptoms.  I also instructed the patient not to hesitate  to call back with any questions and  she indicated she understood and was  happy to follow with this plan.  Also, the patient is scheduled to see  her primary cardiologist, Dr. Charlies Constable in 6 days, but I told Ms.  Freimuth and I will follow up with the plan.  I will call in the office  to see if we can get her in sooner given her worsening symptoms of  congestive heart failure.     Jarrett Ables, [facility]     MS/MedQ  DD: 11/29/2009  DT: 11/30/2009  Job #: [id number]

## 2011-03-09 NOTE — Op Note (Signed)
Meagan [name], Meagan [name]              ACCOUNT NO.:  [id number]   MEDICAL RECORD NO.:  [id number]          PATIENT TYPE:  AMB   LOCATION:  SDS                          FACILITY:  [facility]   PHYSICIAN:  Salvadore Farber, MD  DATE OF BIRTH:  Apr 09, 1946   DATE OF PROCEDURE:  10/29/2006  DATE OF DISCHARGE:                               OPERATIVE REPORT   PROCEDURE:  Abdominal aortography with bilateral extremity runoff.   INDICATIONS:  [name] is a 65 year old lady with atherosclerotic  coronary disease and fibromyalgia.  She has long-standing pain in the  entirety of both legs.  Resting ABIs are normal.  Exercise ABIs were  unable to be completed due to severe pain and dyspnea while walking.  Dr. Juanda Chance requested invasive angiography for definitive exclusion of  ischemia as the etiology of some of her leg pain.   PROCEDURE TECHNIQUE:  Informed consent was obtained.  Under 1% lidocaine  local anesthesia, a 5-French sheath was placed in the right common  femoral artery using the modified Seldinger technique.  A pigtail  catheter was advanced to the suprarenal abdominal aorta.  Abdominal  aortography was performed by power injection.  The pigtail catheter was  then pulled back to the infrarenal abdominal aorta.  Abdominal  aortography with bilateral lower extremity runoff to the feet was  performed with power injection.  The catheter was then removed over a  wire.  The patient was transferred to the holding room in stable  condition.  The sheath will be removed there.   COMPLICATIONS:  None.   FINDINGS:  1. Abdominal aorta:  Angiographically normal.  2. Renal arteries:  Single vessels bilaterally.  Both are normal.  3. Right leg:  Normal common iliac, external iliac, internal iliac,      and common femoral.  The profunda and SFA are normal.  The      popliteal is normal and there is three-vessel runoff to the foot.  4. Left leg:  Normal common, external, and internal iliac arteries.     The common femoral and profunda are normal.  The SFA has a 20%      stenosis in its mid section.  The popliteal is normal with three      vessel runoff to the foot.   IMPRESSION/RECOMMENDATIONS:  Minimal nonobstructive atherosclerotic  disease of the left popliteal.  Normal vasculature in the right leg.  None of her leg pain is attributable to her peripheral arterial disease.  I suspect it is related to her fibromyalgia perhaps with a component of  sciatica.      Salvadore Farber, MD  Electronically Signed    WED/MEDQ  D:  10/29/2006  T:  10/29/2006  Job:  [id number]   cc:   Madelin Rear. Sherwood Gambler, MD  Everardo Beals Juanda Chance, MD, [facility]

## 2011-03-09 NOTE — H&P (Signed)
Meagan [name], SUNDBERG NO.:  [id number]   MEDICAL RECORD NO.:  [id number]          PATIENT TYPE:  OIB   LOCATION:  6533                         FACILITY:  [facility]   PHYSICIAN:  Charlies Constable, M.D. [facility] DATE OF BIRTH:  [birth year]   DATE OF ADMISSION:  10/05/2005  DATE OF DISCHARGE:                                HISTORY & PHYSICAL   PRIMARY CARE PHYSICIAN:  Elfredia Nevins, MD.   PRIMARY CARDIOLOGIST:  Charlies Constable, M.D. [facility].   PATIENT PROFILE:  [name] with prior history of CAD and  CABG who presents today for PCI of total occlusion of the RCA.   1.  CAD. a. Status post CABG x2 in 1997 with a vein graft to the PL and LIMA      to the LAD.  b. September 28, 2005 cardiac catheterization EF 60%, left      main 40%, LAD 80% proximal and 50% distal, LIMA and LAD patent, left      circumflex 95% mid with total occlusion of the OM2, vein graft to the PL      patent with left to right collaterals to the distal RCA distribution,      RCA 95% proximal, 100% mid.  2.  Hypertension.  3.  Hyperlipidemia.  4.  Type 2 diabetes mellitus.  5.  Fibromyalgia.  6.  Obesity.  7.  History of CHF related to diastolic dysfunction.   HISTORY OF PRESENT ILLNESS:  [name] with a history of  CAD and CABG in 1997. He saw Dr. Juanda Chance in clinic on September 25, 2005  secondary to increasing intermittent exertional chest discomfort described  as substernal chest pressure radiating up to her neck. She subsequently  underwent left heart cardiac catheterization on December 8 revealing total  occlusion of the mid right coronary artery which was a change for her.  Decision was made to bring her back today for attempted PCI of the RCA.  Since her catheterization she has had multiple episodes of exertional chest  discomfort relieved with sublingual nitroglycerin.   ALLERGIES:  No known drug allergies.   CURRENT MEDICATIONS:  1.  Imdur 30 mg daily.  2.  Lasix 40  mg daily.  3.  Lopressor 50 mg b.i.d.  4.  Crestor 40 mg daily.  5.  Nexium 40 mg daily.  6.  Diovan/HCTZ 25 mg daily.  7.  Cymbalta 60 mg daily.  8.  Plavix 75 mg daily.  9.  Lyrica 50 mg daily.  10. Coenzyme Q10 daily.  11. Glucophage 500 mg b.i.d. currently on hold.  12. Glucotrol XL 10 mg daily.   FAMILY HISTORY:  Father died of an MI at age 24. Mother died of oral cancer  at age 38. She has an older brother who is of ill health with history of  MIs, PCIs and COPD. She has an older sister who has had an MI and CABG, and  another sister who is alive and well.   SOCIAL HISTORY:  She has never smoked cigarettes and denies any alcohol or  drugs. She lives in  [place] with her husband and dog.   REVIEW OF SYSTEMS:  Positive for exertional chest pain and dyspnea on  exertion, otherwise all systems are reviewed and negative.   PHYSICAL EXAMINATION:  Vital signs: Temperature 97.1, heart rate 66,  respirations 16, blood pressure 133/56. She is 66 inches tall and weighs 174  pounds. General: A pleasant white female in no acute distress. Awake, alert  and oriented x3. Neck: Normal carotid upstrokes, no bruits or JVD. Lungs:  Respirations are regular and unlabored. Clear to auscultation. Cardiac:  Regular S1, S2. No S3 or S4, murmurs. Abdomen: Round, soft, nontender,  nondistended. Bowel sounds present x4. Extremities: Warm, dry, pink. No  clubbing, cyanosis or edema. Dorsalis pedis, posterior tibial pulses 2+  bilaterally. The right groin site which was used for catheterization last  week is clear, no bleeding, bruits, no hematoma.   ACCESSORY CLINICAL FINDINGS:  Lab work is currently pending. Her EKG: Sinus  rhythm with nonspecific T wave abnormality.   ASSESSMENT/PLAN:  1.  Coronary artery disease. The patient has known right coronary artery      occlusion from catheterization last week. She presents today for PCI and      attempted reopening of this occlusion. She remains on aspirin,  Plavix,      beta blocker, ARB and Statin therapy.  2.  Hypertension. This is relatively well-controlled. We will need to follow      closely. Continue ARB diuretic combination as well as Lasix and beta      blocker.  3.  Hyperlipidemia continue Statin therapy.  4.  Type 2 diabetes mellitus. Glucophage is currently on hold. We will add      sliding scale insulin and continue Glucotrol.  5.  Obesity. The patient will likely benefit from cardiac rehab.      Ok Anis, NP    ______________________________  Charlies Constable, M.D. [facility]    CRB/MEDQ  D:  10/05/2005  T:  10/06/2005  Job:  [id number]

## 2011-03-09 NOTE — H&P (Signed)
[facility]  Patient:    Meagan [name], UNCAPHER Visit Number: [id number] MRN: [id number]          Service Type: MED Location: 2A A209 01 Attending Physician:  Colette Ribas Dictated by:   Colette Ribas, M.D. Admit Date:  02/16/2002                           History and Physical  DATE OF BIRTH:  [birth year]  PRIMARY CARE PHYSICIAN:  [facility].  ADMISSION DIAGNOSES: 1. Right-sided chest pain. 2. Shortness of breath. 3. Coronary artery disease. 4. Fibromyalgia. 5. Gastroesophageal reflux disease.  HISTORY OF PRESENT ILLNESS:  The patient is a 65 year old female with history of fibromyalgia, coronary artery disease, GERD, hyperlipidemia, who presented with two to three days of right-sided chest pain, worse with deep inspiration. Some associated shortness of breath.  She did have some associated radiation to the left side.  Some bilateral arm radiation as well.  It also radiated to the right scapula.  No associated nausea, vomiting, or diaphoresis.  Some associated cough and congestion.  No ______ .  Review of systems otherwise negative.  PAST MEDICAL HISTORY: 1. Fibromyalgia. 2. Coronary artery disease. 3. GERD. 4. Hyperlipidemia. 5. Hypertension.  PAST SURGICAL HISTORY: 1. Hysterectomy in 1987. 2. Angioplasty in 1993. 3. CABG in 1997.  ALLERGIES:  ANTI-INFLAMMATORIES, SULFA, and ZANAFLEX.  SOCIAL HISTORY:  Does not drink nor smoke.  Retired.  FAMILY HISTORY:  Very positive for coronary artery disease and fibromyalgia.  PHYSICAL EXAMINATION:  VITAL SIGNS:  Temperature 98.2, respirations 20, blood pressure 178/80, pulse 76.  Weight 189.  GENERAL:  Pleasant female in no acute distress when I saw her in the office, with no real significant chest pain when I saw her then.  HEENT:  Nasal and oropharynx clear.  NECK:  No JVD.  CHEST:  Right lower lung field with quite a bit of congestion and decreased breath sounds,  some rales.  No wheezing.  No left lower lung congestion auscultated.  CARDIOVASCULAR:  Regular rate and rhythm.  Normal S1, S2.  No murmurs, gallops, rubs.  ABDOMEN:  Soft and nontender.  Nondistended.  EXTREMITIES:  No cyanosis, clubbing, or edema.  LABORATORY DATA:  Chest x-ray in the office showed right lower lung collapse versus infiltrate.  IMPRESSION:  The patient is a 65 year old female with right-sided chest wall pain and shortness of breath.  PLAN: 1. Admit for rule out of myocardial infarction. 2. Chest CT. 3. Serial enzymes and ECG. 4. Rocephin 1 g q.12h. 5. CBC, Chem-12. 6. [facility] consult. 7. Will continue to follow closely. Dictated by:   Colette Ribas, M.D. Attending Physician:  Colette Ribas DD:  02/16/02 TD:  02/16/02 Job: [id number]

## 2011-03-09 NOTE — Cardiovascular Report (Signed)
NAMEMILLI, Meagan [name]              ACCOUNT NO.:  [id number]   MEDICAL RECORD NO.:  [id number]          PATIENT TYPE:  OIB   LOCATION:  6533                         FACILITY:  [facility]   PHYSICIAN:  Charlies Constable, M.D. [facility] DATE OF BIRTH:  [birth year]   DATE OF PROCEDURE:  10/05/2005  DATE OF DISCHARGE:  10/06/2005                              CARDIAC CATHETERIZATION   CLINICAL HISTORY:  Meagan [name] is 65 years old and has had prior bypass  surgery.  She has  had symptoms of exertional chest pain for about three  months and was studied in the outpatient laboratory last week.  She had a  patent vein graft to the posterolateral branch of the circumflex artery and  a patent internal mammary artery graft to the LAD which supplied collaterals  to the distal right coronary artery.  Right coronary artery had a 95%  stenosis in its mid portion followed by what appeared to be a complete  occlusion.  Her left ventricular function was good with no areas of  hypokinesis and an estimated ejection fraction of 60%.  We brought him back  today for intervention on what we thought was a chronic total occlusion of  the right coronary artery.   PROCEDURE:  The procedure was performed via the right femoral arteries and  arterial sheath and 6 [place] JR-4 guiding catheter with side holes.  The  patient was given Angiomax bolus and infusion.  She was enrolled in the  CHAMPION trial and was randomized to either __________ or Plavix. We used a  long PT-2 light support wire.  We will cross the lesion in the mid vessel  with the wire without too much difficulty.  On the initial angiogram, there  were two tandem lesions with TIMI-1 flow distally.  We were able to cross  both lesions, then pass the wire in the distal vessel.  We then used a 2.25  x 20 mm Maverick and performed two inflations up to 10 atmospheres for 30  seconds.  We then deployed a 2.5 x 24 mm TAXUS stent in the mid to distal  vessel deploying this  with one inflation of 10 atmospheres for 30 seconds.  We then pulled the balloon inside the distal edge and performed a second  inflation up to 15 atmospheres.  We then deployed a second TAXUS stent which  was 2.5 x 20 mm overlapping the first stent by about 2 to 3 mm.  We deployed  this with one inflation up to 15 atmospheres for 30 seconds.  We then post  dilated the two stents with a 2.75 x 20 mm Quantum Maverick performing three  inflations up to 20 atmospheres for 30 seconds.  At the end of the  procedure, there was a slight extraluminal opacity at the proximal edge of  the stent which we through represented a minimal edge tear.  We felt this  was not enough to warrant a third stent.  Patient tolerated the procedure  well and left the laboratory in satisfactory condition.  We closed the right  femoral artery with AngioSeal at the end of  the procedure.   RESULTS:  Initially the stenosis in the mid vessel was 95% followed by a  second 90% stenosis with TIMI-1 flow distally.  Following placement of  Tandem overlying stents, the stenoses improved from 95 and 90% to 0%.  The  flow improved from TIMI-1 to TIMI-3 flow.   CONCLUSION:  Successful stenting of a chronically total occluded right  coronary artery using two overlapping TAXUS drug eluting stents with  improvement in percent of narrowing from 99 and 90% to 0% and improvement in  the flow from TIMI-1 to TIMI-3 flow.   DISPOSITION:  Patient returned to the post anesthesia care unit for further  observation.           ______________________________  Charlies Constable, M.D. [facility]     BB/MEDQ  D:  10/05/2005  T:  10/08/2005  Job:  [id number]   cc:   Madelin Rear. Sherwood Gambler, MD  Fax: [phone]   Cardiopulmonary Lab

## 2011-03-09 NOTE — Discharge Summary (Signed)
[facility]  Patient:    Meagan [name], Meagan [name] Visit Number: [id number] MRN: [id number]          Service Type: OUT Location: [facility] Attending Physician:  Darlin Priestly Dictated by:   Colette Ribas, M.D. Admit Date:  03/02/2002 Discharge Date: 03/02/2002                             Discharge Summary  DISCHARGE DIAGNOSIS:  Chest pain ruled out cardiovascular cause.  HISTORY OF PRESENT ILLNESS/ PAST MEDICAL HISTORY:  For this information please see admission history and physical.  HOSPITAL COURSE:  A 65 year old female who presented with right-sided chest wall pain and shortness of breath.  She is admitted for rule out myocardial infarction.  Chest CT was performed.  Serial enzymes and ECGs were done.  She was covered with Rocephin 1 gram q.12h. in the event that this was a pulmonary process.  Dr. Juanetta Gosling was also consulted who co-followed.  He felt like she had an pneumonia and probable atypical.  Zithromax was added by myself on the first day.  We discussed with the patient the need for bronchoscopy and possible thoracentesis.  Radiology attempted thoracentesis but there was not enough fluid and they decided to wait on this.  Due to her known coronary artery disease, [facility] was also consulted.  CPK MB and troponin were all negative x3.  Dr. Dietrich Pates thought this was pleuritic chest pain secondary to pneumonia.  He had no further cardiovascular interventions.  The chest CT showed patchy air space filling with bilateral upper lobe and right lower lobe, most likely infections.  They were reactive mediastinal nodes.  There were also low attenuated lesions in the dome of the liver and spleen which were too small to characterize.  I felt like there was an underlying process going on in the lungs that was not simply infectious.  Once the patient is stabilized as an outpatient, we plan either getting bronchoscopy or perform thoracentesis.   We will need to repeat a chest CT as well as abdominal and pelvis CT.  DISCHARGE PHYSICAL EXAMINATION:  Please see progress note from that day.  DISCHARGE MEDICATIONS:  The same as on admission. 1. Increasing Norvasc to 10 mg daily. 2. Plavix 75 mg daily. 3. Clarinex daily. 4. Avelox 400 mg daily x10 days.  FOLLOWUP: She will follow up with Dr. Phillips Odor in 10 days.  Follow up with Dr. Juanetta Gosling at the same time. Dictated by:   Colette Ribas, M.D. Attending Physician:  Darlin Priestly DD:  03/03/02 TD:  03/04/02 Job: [id number]

## 2011-03-09 NOTE — H&P (Signed)
[name], [name]              ACCOUNT NO.:  [id number]   MEDICAL RECORD NO.:  [id number]          PATIENT TYPE:  AMB   LOCATION:  SDS                          FACILITY:  [facility]   PHYSICIAN:  Meagan Farber, MD  DATE OF BIRTH:  01/06/1946   DATE OF ADMISSION:  10/29/2006  DATE OF DISCHARGE:                              HISTORY & PHYSICAL   HISTORY OF PRESENT ILLNESS:  Meagan [name] is a 65 year old lady with  hypertension, diabetes mellitus, coronary artery disease, and  fibromyalgia. She has chronic pain of her entire body which has been  attributed to the fibromyalgia. She also has chronic bilateral leg pain  that she says involves the entirety of both legs. When pushed the most  severe portions of pain are in the anterior thighs and lower shins  bilaterally. She says this bothers her most when lying in the bed at  night. It also bothers her with standing and with walking. Such pain  occurs often with only walking a few steps.   PAST MEDICAL HISTORY:  1. Coronary artery disease status post coronary bypass grafting 1997,      percutaneous intervention of the right coronary artery 2006.  2. Status post hysterectomy.  3. Fibromyalgia.  4. Diabetes mellitus.  5. Hypertension.  6. Hypercholesterolemia.   ALLERGIES:  SULFA, CODEINE, NIACIN.   CURRENT MEDICATIONS:  1. Imdur 30 mg per day.  2. Lasix 40 mg per day.  3. Lopressor 50 mg twice per day.  4. Nexium 40 mg per day.  5. Diovan/HCTZ 160/25 one per day.  6. Cymbalta 60 mg per day.  7. Plavix 75 mg per day.  8. Glucophage 500 mg three times per day (held x48 hours).  9. Coenzyme Q10.  10.Lyrica 100 mg three times per day.  11.Vytorin 10/40 mg one per day.  12.Glucotrol 10 mg twice per day (the patient took it this morning      despite instructions from our office to hold it).  13.Xanax 0.5 mg p.r.n.  14.Nitroglycerin p.r.n.  15.Ultram 50 mg p.r.n.  16.Phenergan 25 mg p.r.n.   SOCIAL HISTORY:  She retired in  1997. She was previously very athletic,  being voted most athletic in her senior high school class. She enjoys  travel, reading, and playing on the computer. She is married with no  children. Her husband accompanies her today. She has never smoked and  denies alcohol and illicit drug use.   FAMILY HISTORY:  Father died at 23 of myocardial infarction. Mother died  at 50 of oral cancer. A brother is alive with heart disease and COPD at  62. A sister is alive and well at 84. Another sister is alive at 69 with  coronary disease and hypertension. Two brothers died in a motor vehicle  accident.   REVIEW OF SYSTEMS:  Remarkable for wearing glasses. Occasional nausea  including this morning. Occasional edema in both ankles. Chronic  allergic rhinitis. Review of Systems is, otherwise, negative in detail  except as above.   PHYSICAL EXAMINATION:  VITAL SIGNS:  She is generally well-appearing in  no distress with heart  rate 58, blood pressure 128/67, temperature 97.4,  and oxygen saturation of 94% on room air. She is 5 feet tall and weighs  80 kg.  SKIN:  Normal.  HEENT:  Normal.  MUSCULOSKELETAL:  Normal.  NECK:  She has no jugular venous distension, thyromegaly or  lymphadenopathy.  LUNGS:  Clear to auscultation. Respiratory effort is normal. She has a  nondisplaced point of maximal cardiac impulse.  HEART:  There is a regular rate and rhythm without murmur, rub or  gallop.  ABDOMEN:  Soft, nondistended, nontender. There is no hepatosplenomegaly.  Bowel sounds are normal. There is no pulsatile midline mass and no  abdominal bruit.  EXTREMITIES:  Warm without clubbing, cyanosis, edema, or ulceration.  Carotid pulses are 2+ bilaterally without bruit. Femoral pulses 1+  bilaterally without bruit. Popliteal pulses not palpable on either side.  Dorsalis pedis 2+ on the right and 1+ on the left. PT pulses 2+ on the  left and not palpable on the right.   LABORATORY STUDIES:  ABIs in the summer  were 1.1 bilaterally. She was  unable to fully comply with exercise ABIs due to pain and what she felt  was a panic attack causing shortness of breath. To the limited extent of  exercise she was able to do, the ABI on the right dropped to 0.64 and on  the left is 0.81 returning to normal 4 minutes post exercise.   IMPRESSION/RECOMMENDATION:  A 65 year old lady with fibromyalgia and  probable peripheral arterial disease. I think her symptoms are  multifactorial. However, the discomfort that she develops in her legs  with walking may well be due to bilateral aortoiliac disease. Will  proceed to angiography with an eye to percutaneous revascularization of  aortoiliac stenosis if present. Risks including bleeding, infection, and  vascular injury were explained in detail to Meagan [name] and her  husband. They agree to proceed.      Meagan Farber, MD  Electronically Signed     WED/MEDQ  D:  10/29/2006  T:  10/29/2006  Job:  [id number]   cc:   Meagan Beals. Juanda Chance, MD, [facility]  Meagan Rear. Sherwood Gambler, MD

## 2011-03-09 NOTE — Letter (Signed)
May 02, 2006     Charlies Constable, M.D.  [address]  [place], [place] [address]   RE:  SHIZUYE, RUPERT  MRN:  [id number]  /  DOB:  [birth year]   Dear Smitty Cords,   Thank you for referring Ms. Lucente for consultation regarding her leg  pain.  As you know, Ms. Bye is a 65 year old lady with hypertension,  diabetes mellitus and coronary artery disease.  She has chronic all over  body pain which she attributes to  her fibromyalgia.  This makes assessment  of any one specific pain all the more difficult.  Nevertheless, she says she  can't walk for the past five or more years.  Specifically, she says that  during the day, she has pain in the entirety of both legs, extending from  the hips to the toes with walking, often only a few steps.  She says it also  occurs with standing but is not nearly as severe.  Interestingly, she has  exactly the same pain at night which is entirely resolved with using an  electric blanket.  She is not able to tell me if any one part of her leg is  worse than any other.  She  feels that both legs are equally affected.   She has recently begun under the care of a neurologist in [place].  She tells  me that she is now fairly convinced that her problem is neurologic.   Her past medical history is remarkable for coronary artery bypass grafting  in 1997 with recent percutaneous treatment of the right coronary artery.  She is status post a distant hysterectomy.  She has fibromyalgia, diabetes,  and hypertension.   SHE IS ALLERGIC TO SULFA, CODEINE AND NIACIN.   Her current medications are:  1.  Lopressor 50 mg twice per day.  2.  Vytorin 10/40 one per day.  3.  Nexium 40 mg per day.  4.  Diovan HCTZ 160/25.  5.  Cymbalta 60 mg per day.  6.  Glucophage 500 mg twice per day.  7.  Plavix 75 mg per day.  8.  Lyrica 50 mg three times per day.  9.  Imdur 30 mg per day.  10. Coenzyme Q10.  11. Lasix 40 mg per day.  12. Phenergan  p.r.n.  13. Darvocet p.r.n.  14. Ultram p.r.n.  15. Xanax 0.5 mg twice per day.   She retired in 1997.  She says she enjoys travel, reading and playing on the  computer.  She is married with no children.  She is accompanied by her  sister today.  She never smoked and denies alcohol and illicit drug use.   Her father died at 45 of myocardial infarction, mother died at 32 of oral  cancer.  A brother is alive with heart disease and COPD at 50.  A sister is  alive and well at 93 and another sister is alive at 82 with coronary disease  and hypertension. Two brothers died in a motor vehicle accident.   Review of systems is remarkable for wearing glasses.  She has occasional  nausea.  She has occasional edema in her ankle.  She has allergic rhinitis.  Review of systems is otherwise negative in detail except as above.   On physical examination she is generally well appearing in no distress with  heart rate of 60, blood pressure 124/60 on the right and 128/68 on the left.  She weighs 172 pounds and is  5 feet tall.  HEENT is normal.  Skin exam is  normal. Musculoskeletal exam is normal.  She has no jugular venous  distention, thyromegaly or lymphadenopathy.  Lungs are clear to  auscultation.  Respiratory effort is normal.  She has a nondisplaced point  of maximal cardiac impulse.  There is a regular rate and rhythm without  murmur, rub or gallop.  The abdomen is soft, nondistended,  nontender. There  is no hepatosplenomegaly.  Bowel sounds are normal.  There is no pulsatile  midline mass. No abdominal bruit.  The extremities are warm without  clubbing, cyanosis,  edema or ulceration.  Carotid pulses 2+ bilaterally  without bruit.  Radial pulses 2+ bilaterally.  Femoral pulses are 1+  bilaterally, probably somewhat weaker on the right.  Popliteal pulses are  not palpable on either side. The dorsalis pedis is 2+ on the right and 1+ on  the left.  PT pulses are trace bilaterally.   You recently  found her to have normal resting ABIs at 1.1.  You attempted to  obtain exercise ABIs,  however, she stopped at only two minutes of the  exercise program stating that she couldn't breathe and that her legs hurt.  The patient tells me she  thinks she had a panic attack during this test.  In any case, her post exercise ABIs were 0.64 on the right and 0.81 on the  left, returning to normal at four minutes post exercise.   I think Ms. Silverthorn's symptoms are most likely multifactorial, however,  the discomfort she develops in her legs may well be due to what I think is  significant aorto-iliac disease bilaterally based on the diminished femoral  pulses and the abnormal exercise ABIs.  When I began to discuss additional  testing with the goal of improvement in her symptoms, she told me that she  was fairly convinced that her symptoms were neurologic, not related to any  vascular disease and that she does not wish any further testing at this  time.  She is to see her neurologist tomorrow  for further treatment.  I,  therefore, suggested that she assess her response to this currently planned  course of treatment and return to me in three months for further discussion  on treatment options should her symptoms persist.   Thank you for the opportunity to participate in her care.    Sincerely,      Salvadore Farber, MD   WED/MedQ  DD:  05/02/2006  DT:  05/02/2006  Job #:  [id number]   CC:    Madelin Rear. Sherwood Gambler, MD

## 2011-03-09 NOTE — Discharge Summary (Signed)
[place]  Patient:    Meagan [name], Meagan [name] Visit Number: [id number] MRN: [id number]          Service Type: END Location: ENDO Attending Physician:  Mardella Layman Dictated by:   Vania Rea. Jarold Motto, M.D. [facility] Admit Date:  05/06/2002 Discharge Date: 05/06/2002                             Discharge Summary  PROCEDURE:  Outpatient endoscopy.  CLINICAL NOTE:  Meagan [name] is a 65 year old white female who has had chronic acid reflux symptoms with recent early satiety, questionable dysphagia, and has been refractory to outpatient PPI therapy, which has recently been increased to twice a day.  It was felt that an endoscopy was indicated for diagnostic purposes and possible esophageal dilatation.  The risks and benefits of these procedures were explained in detail, and she agreed to proceed as planned.  Preoperative cardiopulmonary and mental status exams were unremarkable.  DESCRIPTION OF PROCEDURE:  She was premedicated with Cetacaine spray to the oropharynx, fentanyl 50 mcg IV and Versed 5 mg IV.  She was intubated using the Olympus adult video endoscope.  The oropharynx was unremarkable, as were the vocal cords.  The esophagus was generally unremarkable throughout its length except for some smooth narrowing in the distal esophagus with a 3-4 cm hiatal hernia.  The endoscope passed without difficulty in the stomach.  There was a large amount of partially-digested food products in the stomach, making exam of the antrum and pylorus.  However, I was able to navigate through the pyloric channel and there was some mild duodenitis with what appeared to be postbulbar stenosis.  However, I was able to pass the small endoscope through this area into the duodenal C-loop.  Pictures were obtained for documentation. Retroflexed view of the fundus and cardia of the stomach was otherwise unremarkable.  The patient was then extubated without difficulty.  She  was then dilated with a #54 [place] dilator.  She also tolerated this procedure well, was returned in stable condition to the recovery room for observation.  ASSESSMENT: 1. Hiatal hernia with chronic gastroesophageal reflux disease. 2. 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 3. Duodenitis with postbulbar stenosis, rule out Helicobacter pylori    infection.  A CLO biopsy was obtained at this time.  RECOMMENDATIONS: 1. Continue reflux regimen with twice a day PPI therapy. 2. Treat for H. pylori infection if the CLO biopsy is positive. 3. Low-residue and frequent small feeding diet. 4. Office follow-up in several weeks time. Dictated by:   Vania Rea. Jarold Motto, M.D. [facility] Attending Physician:  Mardella Layman DD:  05/06/02 TD:  05/09/02 Job: [id number]

## 2011-03-09 NOTE — Consult Note (Signed)
[facility]  Patient:    Meagan [name], Meagan [name] Visit Number: [id number] MRN: [id number]          Service Type: OUT Location: [facility] Attending Physician:  Darlin Priestly Dictated by:   Kari Baars, M.D. Admit Date:  02/16/2002 Discharge Date: 02/16/2002                            Consultation Report  Patient of Dr. Phillips Odor  REASON FOR CONSULTATION:  Abnormal chest x-ray.  SUBJECTIVE:  Meagan [name] is a 65 year old who has a history of coronary disease, esophageal reflux, hyperlipidemia, and fibromyalgia.  She came to the emergency room with chest pain which was pleuritic.  She has had no real shortness of breath associated with it.  She thinks that she has been coughing some and she may have had some fever.  PAST MEDICAL HISTORY:  Positive for fibromyalgia, coronary disease, esophageal reflux, hyperlipidemia, and hypertension.  She has had coronary artery bypass grafting in 1997.  Has had a hysterectomy.  SOCIAL HISTORY:  She is a nonsmoker.  She does not drink any alcohol.  FAMILY HISTORY:  There is a great deal of coronary artery disease in her family.  PHYSICAL EXAMINATION  GENERAL:  She appears to be in no acute distress.  VITAL SIGNS:  She is afebrile now.  Respirations are about 20, blood pressure 150/70, pulse 80.  HEENT:  Pupils are round, reactive to light and accommodation.  Nose and throat are clear.  Mucous membranes are moist.  CHEST:  Rales bilaterally more on the right than on the left, perhaps some decreased breath sounds on the right.  HEART:  Regular without gallop.  ABDOMEN:  Soft without masses.  EXTREMITIES:  No edema.  LABORATORIES:  CT scan of the chest shows what appears to be a right pleural effusion, right lower lobe pneumonia, and bilateral upper lobe pneumonia.  I suspect that the effusion is related to her pneumonia.  ASSESSMENT:  I think she has a pneumonia, probably an atypical considering how much  infiltrate she has with a pausity of typical pneumonia symptoms.  I would agree with treatment with Rocephin and Zithromax at this point and would continue on everything else as before.  She may well need a bronchoscopy at some point but I would like to see what she does over the next 48 hours as far as clearing up.  I discussed all this at length with her. Dictated by:   Kari Baars, M.D. Attending Physician:  Darlin Priestly DD:  02/17/02 TD:  02/18/02 Job: [id number]

## 2011-03-09 NOTE — Assessment & Plan Note (Signed)
[facility]                              CARDIOLOGY OFFICE NOTE   NAME:Meagan [name], Meagan [name]                     MRN:          [id number]  DATE:07/12/2006                            DOB:          1946/08/23    PRIMARY CARE PHYSICIAN:  Dr. Artis Delay in [place].   CLINICAL HISTORY:  Mr. Verville is 65 years old and has had prior bypass  surgery and early this year underwent percutaneous intervention of a  chronically totally occluded right artery with a very good result, with  tandem overlying drug eluting stents.  Meagan [name] has done quite well from this  standpoint and has had no recurrent chest pain, shortness of breath, or  palpitations and had a negative Myoview scan last week.   Meagan [name] also has left hip pain with exertion.  We have evaluated for peripheral  vascular disease.  Meagan [name] had rest and exercise Dopplers and with exercise Meagan [name]  indices fell to 81% on the left and 64% on the right.  Dr. Samule Ohm saw Meagan [name] in  consultation and thought that Meagan [name] might have an iliac disease causing Meagan [name]  symptoms but Meagan [name] was reluctant to have anything done and a neurologic  evaluation was pending.  Meagan [name] had a neurologic evaluation by a neurologist in  [place] and according to Meagan [name] account he did not find any major nerve  problem.   PAST MEDICAL HISTORY:  Meagan [name] past medical history is significant for  hypertension, diabetes and fibromyalgia.  Meagan [name] has also been under a great  deal of stress caring for Meagan [name] brother, Blair Heys, and Meagan [name] has a great  deal of anxiety.   CURRENT MEDICATIONS:  Inderal, Lasix, Lopressor, Nexium, Diovan/  hydrochlorothiazide, Cymbalta, Glucotrol, Plavix, Glucophage, Lyrica.   PHYSICAL EXAMINATION:  On examination, blood pressure is 101/63 and pulse is  67 and regular.  There was no venous distention.  The carotids were full,  without bruits.  Chest was clear.  Cardio rhythm was regular, without  murmurs or gallops.  The abdomen was soft, no  organomegaly.  We had  difficulty feeling the left femoral pulse and both pulses are somewhat  diminished.  There was no peripheral edema.   IMPRESSION:  1. Coronary artery disease status post prior bypass surgery  and status      post percutaneous coronary intervention of a chronically totally      occluded right coronary artery with overlapping drug eluting stents,      now stable with negative Myoview scan.  2. Good left ventricular  function.  3. Left hip and lower extremity pain possibly related to peripheral      vascular disease.  4. History of congestive heart failure and diastolic dysfunction.  5. Hypertension.  6. Hyperlipidemia.  7. Diabetes.  8. Fibromyalgia.   I discussed the issues on Meagan [name]'s leg pain with Meagan [name].  I think it is  possible this may be related to iliac disease  and I think the best way to  sort this out would be with a peripheral angiogram.  I talked with Dr.  Samule Ohm today and we will  arrange for Meagan [name] to come in to the  hospital for a peripheral angiogram with Dr. Samule Ohm on a day when I am  there.  I will plan to see Meagan [name] back in 6 months for followup on Meagan [name] heart.                               Bruce Elvera Lennox Juanda Chance, MD, [facility]    BRB/MedQ  DD:  07/12/2006  DT:  07/15/2006  Job #:  [id number]

## 2011-03-09 NOTE — Cardiovascular Report (Signed)
NAMEJANAZIA, SCHREIER NO.:  [id number]   MEDICAL RECORD NO.:  [id number]          PATIENT TYPE:  OIB   LOCATION:  1963                         FACILITY:  [facility]   PHYSICIAN:  Charlies Constable, M.D. [facility] DATE OF BIRTH:  1946/02/15   DATE OF PROCEDURE:  09/28/2005  DATE OF DISCHARGE:                              CARDIAC CATHETERIZATION   Mrs. Benedick is 65 years old and had bypass surgery in 1997.  Last cathed  in 2000.  She has been having increasing symptoms of chest pain, primarily  exertional not responsive to medical therapy.  We made a decision to bring  her in for evaluation with catheterization today.  She also has  hypertension, hyperlipidemia and diabetes.   PROCEDURE:  The procedure was performed via the right femoral arteries and  arterial sheath and 4 French preformed coronary catheters.  A LIMA catheter  was used for injection of the LIMA graft.  Patient tolerated the procedure  well and left the laboratory in satisfactory condition.   RESULTS:  Left main coronary artery:  The left main coronary artery had a  40% distal stenosis.   Left anterior descending coronary artery:  The left anterior descending  coronary artery gave rise to two small diagonal branches and a septal  perforator and had tandem 80% stenoses proximally and with competing flow  distally.   Circumflex coronary artery:  The circumflex coronary artery gave rise to a  small marginal branch and then had a 95% mid stenosis and then was  completely occluded in its mid to distal portion.   Right coronary artery: The right coronary artery was completely occluded in  its mid portion after a right ventricular branch.  This finding was new from  the prior study.   The saphenous vein graft to the posterolateral branch of circumflex artery  was patent and functioned normally and filled the distal right coronary  artery via collaterals.   The LIMA graft to the LAD was patent and functioned  normally.  There was 50%  narrowing in the mid LAD after the graft insertion site.  The LAD provided  collaterals to the distal right coronary artery which filled back to near  the mid portion.   Left ventriculogram:  The left ventriculogram performed in the RAO  projection showed good wall motion with no areas of hypokinesis.  The  estimated ejection fraction was 60%.   The aortic pressure was 127/58 with mean of 84.  Left ventricular pressure  was 127/21.   CONCLUSION:  1.  Coronary artery disease status post coronary artery bypass graft surgery      in 1997.  2.  Severe native vessel disease with total occlusion of the distal left      main, tandem 80% stenosis in the proximal left anterior descending with      competing flow distally, 95% mid and total occlusion in the mid to      distal circumflex artery, and total occlusion of the mid right coronary      artery (new).  3.  Patent vein grafts to the posterolateral branch of  the circumflex artery      and patent left internal mammary artery graft to the left anterior      descending both of which supplying collaterals to the distal right      coronary artery.  4.  Normal left ventricular function with an estimated ejection fraction of      60%.   RECOMMENDATIONS:  The patient has a total occlusion of the mid right  coronary artery which is new from her last angiogram.  Her symptoms suggest  that this may be three months old.  We duplicated the angles on the  injection of the right coronary artery and injection of the LIMA graft which  provides collaterals and it appears that the distal vessel fills almost up  to the side of complete occlusion.  There is also a 95% stenosis just before  the complete occlusion.  I think this anatomy is reasonably favorable for an  attempt to open the chronic total occlusion.  I will discuss with this  patient and if she is agreeable, will schedule this in the next week.            ______________________________  Charlies Constable, M.D. [facility]     BB/MEDQ  D:  09/28/2005  T:  09/28/2005  Job:  [id number]

## 2011-03-09 NOTE — H&P (Signed)
NAME:  Meagan [name], Meagan [name]                        ACCOUNT NO.:  [id number]   MEDICAL RECORD NO.:  [id number]                   PATIENT TYPE:  OUT   LOCATION:  RAD                                  FACILITY:  [facility]   PHYSICIAN:  Meagan [name], M.D.                DATE OF BIRTH:  1946/03/22   DATE OF ADMISSION:  04/12/2003  DATE OF DISCHARGE:                                HISTORY & PHYSICAL   HISTORY OF PRESENT ILLNESS:  [name] is a 65 year old female with a  past medical history of coronary artery disease, status post coronary artery  bypass grafting, hypertension, diabetes mellitus that was recently  diagnosed, hyperlipidemia, fibromyalgia, and gastroesophageal reflux disease  who presents for right shoulder pain.  I do not have the patient's records  available.  She apparently had a PCI in 1993.  She subsequently underwent  coronary artery bypass grafting in 1997.  She typically denies any  exertional chest pain, but she states that she never feels good.  She has  various pains typically at rest.  She typically does not have exertional  chest pain.  Approximately 10 days ago she noticed transient jaw pain that  did not radiate.  There was mild shortness of breath, but there was no  nausea, vomiting, or diaphoresis.  This resolved.  Over the past three days,  she has described left shoulder pain.  Also in the left scapula area.  There  is no associated increased shortness of breath, nausea, vomiting, or  diaphoresis, and the pain is not positional.  It has been continuous for the  past three days without it ever completely resolving.  She was seen in Dr.  Lamar [name] office today, and was referred to the emergency room for further  evaluation and admission.   PAST MEDICAL HISTORY:  1. Recently diagnosed diabetes mellitus.  2. Hypertension.  3. Hyperlipidemia.  4. History of pneumonia.  5. Coronary artery bypass grafting as described in the HPI.  6. Fibromyalgia.  7. History  of gastroesophageal reflux disease, as well as esophagitis.  8. Degenerative joint disease.  9. She has had a prior hysterectomy.   MEDICATIONS AT HOME:  1. Lopressor 50 mg p.o. b.i.d.  2. Crestor 40 mg p.o. daily.  3. Nexium 40 mg p.o. daily.  4. Diovan/HCTZ of unclear dose.  5. Effexor 75 mg p.o. b.i.d.  6. Plavix 75 mg p.o. daily.  7. Clarinex 5 mg p.o. p.r.n.  8. Ultram 50 mg p.o. daily p.r.n.  9. Darvocet-N 100 p.r.n.  10.      Xanax 0.5 mg p.o. p.r.n.  11.      Hydrocodone p.r.n.   ALLERGIES:  1. ANTI-INFLAMMATORIES by her report.  2. ASPIRIN.  3. SULFA.  4. NIACIN.   SOCIAL HISTORY:  She does not smoke, nor does she consume alcohol.   FAMILY HISTORY:  Positive for coronary artery disease.   REVIEW OF SYSTEMS:  She denies any headaches, fever, or chills.  There is no  productive cough or hemoptysis.  There is no dysphagia, odynophagia, melena,  hematochezia.  There is no dysuria or hematuria.  There is no rash or  seizure activity.  She does describe chronic orthopnea, but there is no PND  or recently increased pedal edema.  The remaining systems are negative other  then diffuse pains from her fibromyalgia.   PHYSICAL EXAMINATION:  VITAL SIGNS:  Blood pressure 105/90, pulse 58.  GENERAL:  She is well-developed, well-nourished, in no acute distress.  She  does not appear to be depressed.  SKIN:  Warm and dry.  It is remarkable for a previous incision scar from her  coronary artery bypass graft.  She has had a previous sternotomy as well as  a vein harvesting.  EXTREMITIES:  There is no peripheral clubbing.  HEENT:  Unremarkable with normal eyelids.  NECK:  Supple, normal upstrokes bilaterally.  There are no bruits noted.  There is no jugular venous distention, no thyromegaly.  CHEST:  Clear to auscultation and percussion.  CARDIOVASCULAR:  Regular rate and rhythm, normal S1 and S2.  There are no  murmurs, rubs, or gallops noted.  Her left shoulder is tenderness to   palpation and she states that the pain that she was experiencing does  increase with those maneuvers.  ABDOMEN:  Nontender, nondistended, positive bowel sounds, no  hepatosplenomegaly, no mass appreciated.  There is no abdominal bruit.  She  has 2+ femoral pulses bilaterally, no bruits.  EXTREMITIES:  I can palpate no cords.  No edema.  She has 2+ dorsalis pedis  pulses bilaterally.  NEUROLOGIC:  Grossly intact.   LABORATORY DATA:  Her electrocardiogram shows sinus rhythm at a rate of 61.  The axis is normal.  There are nonspecific ST changes.   DIAGNOSES:  1. Atypical left shoulder pain.  2. Coronary artery disease, status post coronary artery bypass grafting.  3. Diabetes mellitus.  4. Hypertension.  5. Hyperlipidemia.  6. History of fibromyalgia.  7. Gastroesophageal reflux disease.   PLAN:  [name] presents with shoulder pain that appears to be  musculoskeletal in etiology by my exam.  She does have coronary artery  disease, and she states that when she had symptoms prior to her bypass it  was in the left shoulder area.  We will therefore admit and rule out  myocardial infarction with serial enzymes.  Her  electrocardiogram shows nonspecific ST changes.  If her enzymes are  negative, then we will plan to risk stratify with an outpatient Cardiolite.  We will otherwise continue with her pain medications, as well as her Plavix  and Lopressor.  She will follow up with Dr. Juanda [name].                                               Meagan [name], M.D.    BC/MEDQ  D:  04/12/2003  T:  04/13/2003  Job:  [id number]

## 2011-03-09 NOTE — H&P (Signed)
Meagan, [name]              ACCOUNT NO.:  [id number]   MEDICAL RECORD NO.:  [id number]          PATIENT TYPE:  INP   LOCATION:  A212                          FACILITY:  [facility]   PHYSICIAN:  Madelin Rear. Sherwood Gambler, MD  DATE OF BIRTH:  [birth year]   DATE OF ADMISSION:  01/09/2007  DATE OF DISCHARGE:  LH                              HISTORY & PHYSICAL   CHIEF COMPLAINT:  Cough and shortness of breath.   HISTORY OF PRESENT ILLNESS:  The patient has been having continuous  symptoms dating back as far as 01/03/2007 initial contact in office when  she was having cough, night sweats, wheezing and scanty sputum  production.  She has had treatment with Levaquin for 6-7 days, and in  fact states that her cough is worse and her chills persist.  She also  had associated vertigo.  Exertion makes her symptoms worse, rest makes  them better.  She has had no nausea, vomiting or diarrhea.   PAST MEDICAL HISTORY:  Non-insulin-dependent diabetes mellitus.  Hyperlipidemia.  Gastroesophageal reflux disease.  Fibromyalgia.  Previous coronary artery disease, status post coronary artery bypass  grafting.  History of congestive heart failure related to diastolic dysfunction in  the past.  Hypertension.   SOCIAL HISTORY:  She does not smoke or drink.  No alcohol or illicit  drug use.   FAMILY HISTORY:  Positive for coronary artery disease, cancer.   REVIEW OF SYSTEMS:  GENERAL:  As under HPI.  No skin rash.  No other  constitutional symptoms.  Weight is stable.  HEAD/NECK:  No headache,  blurred vision, double vision or other neurologic symptoms.  Her vertigo  appears to have resolved on treatment.  There has been no odynophagia,  dysphagia or increase in reflux.  CARDIAC:  No palpitations, no chest  pain.  PULMONARY:  As under HPI.  No hemoptysis.  No pleuritic chest  pain.  ABDOMEN:  No hematemesis, hematochezia or melena.  LEGS:  No  swelling.  NEURO:  No diplopia or other focal findings or  symptoms.  ALLERGIES:  No skin rash.   LABORATORY DATA:  Labs pending at present.   IMPRESSION/PLAN:  1. Refractory reactive airways disease.  Possible bilateral pneumonia      versus atypical congestive heart failure.  The patient will be      admitted for intravenous antibiotics, increase in bronchodilator      parenterally as well as by nebulizer.  2. Hypertension.  Well-controlled at present.  Intervene as needed.  3. Coronary artery disease.  Again, we are going to rule out      congestive heart failure with laboratories and chest film, and      address any possible problem related to congestive heart failure.      Rule out ischemia of needed.  Serial enzymes will be obtained as      well as a BNP.  4. Diabetes mellitus.  Continue her oral regimen, p.r.n. insulin      coverage.  5. Hyperlipidemia.  Stable at present.  6. Fibromyalgia.  Continue her outpatient regimen.  Madelin Rear. Sherwood Gambler, MD  Electronically Signed     LJF/MEDQ  D:  01/09/2007  T:  01/09/2007  Job:  [id number]

## 2011-03-09 NOTE — Progress Notes (Signed)
[facility]                        PERIPHERAL VASCULAR OFFICE NOTE   NAME:BAULDINGBreckin, Zafar                       MRN:          [id number]  DATE:10/18/2006                            DOB:          Jun 11, 1946    I spoke with Duanne Moron today when she brought her brother in for an  evaluation.  I had seen her back in September, at which time she was  complaining of lower extremity pain, especially with walking.  She had  abnormal peripheral vascular studies and at that time, we had planned to  set her up for a peripheral angiogram with Dr. Samule Ohm.  She is somewhat  frightened by procedures, and she has known me for a long time, so she  wanted to be sure I was there when she had the procedure done.  We had  planned to set that procedure up on a day when Dr. Samule Ohm and I were  both in the hospital at the same time.  For some reason, this did not  happen, and she talked to me today about it.  We talked with Dr.  Melinda Crutch nurse, Herbert Seta, and she has made arrangements for her to come  to the hospital on January 8th to have peripheral angiogram by Dr.  Samule Ohm on a day when I am in the hospital.   Her peripheral Doppler studies showed that with exercise, her indices  fell to 81% on the left and 64% on the right.  Dr. Samule Ohm had seen her  in consultation prior to this, but she was a little bit reluctant to  have the procedure done at that time.   PAST MEDICAL HISTORY:  1. Her other problems include coronary artery disease, status post      previous bypass surgery and PCI of a chronically totally occluded      right coronary artery with a drug-eluting stent.  2. Good LV function.  3. History of congestive heart failure related to diastolic      dysfunction.  4. Hypertension.  5. Hyperlipidemia.  6. Diabetes.  7. Fibromyalgia.     Bruce Elvera Lennox Juanda Chance, MD, [facility]  Electronically Signed    BRB/MedQ  DD: 10/18/2006  DT: 10/18/2006  Job #: [id number]   cc:    Salvadore Farber, MD  Madelin Rear. Sherwood Gambler, MD

## 2011-03-09 NOTE — Discharge Summary (Signed)
Meagan [name], Meagan [name]              ACCOUNT NO.:  [id number]   MEDICAL RECORD NO.:  [id number]          PATIENT TYPE:  OIB   LOCATION:  6533                         FACILITY:  [facility]   PHYSICIAN:  Arvilla Meres, M.D. LHCDATE OF BIRTH:  01-08-1946   DATE OF ADMISSION:  10/05/2005  DATE OF DISCHARGE:  10/06/2005                                 DISCHARGE SUMMARY   PRINCIPAL DIAGNOSIS:  Coronary artery disease.   OTHER DIAGNOSES:  1.  Hypertension.  2.  Hyperlipidemia.  3.  Obesity.  4.  Fibromyalgia.  5.  Type 2 diabetes mellitus.  6.  Peripheral neuropathy.   PRIMARY CARDIOLOGIST:  Charlies Constable, M.D.   PRIMARY CARE PHYSICIAN:  Madelin Rear. Sherwood Gambler, M.D.   ALLERGIES:  1.  SULFA.  2.  NSAIDS.  3.  CODEINE.  4.  NIACIN.  5.  TIZANIDINE.   PROCEDURE:  PCI and stenting of the total occlusion of the right coronary  artery with two TAXUS drug eluting stents.   HISTORY OF PRESENT ILLNESS:  [name] with prior history  of CAD and CABG in 1997, who has been having progressive intermittent angina  which lead to a cardiac catheterization on September 28, 2005, revealing a  total occlusion of the right coronary artery with a patent LIMA to LAD and  patent vein graft to the posterolateral branch of the left circumflex.  The  total occlusion of the right coronary artery was a new finding in her  anatomy, therefore, she was brought back to the cardiac catheterization lab  on October 05, 2005, for PCI and stenting of the totally occluded RCA.   HOSPITAL COURSE:  Patient underwent successful PCI and stenting of total  occlusion of the right coronary artery by Dr. Charlies Constable with placement of  a 2.5x 24 mm TAXUS drug eluting stent along with a 2.5 x 20 mm TAXUS drug  eluting stent.  She tolerated this procedure well and post procedure, has  been ambulating without recurrent chest discomfort or shortness of breath.  She is being discharged home today in satisfactory  condition.   DISCHARGE LABORATORY DATA:  Hemoglobin 10.9, hematocrit 30.6, wbc 8.4,  platelets 285, MCV 87.1.  Sodium 139, potassium 3.4, chloride 99, CO2 29,  BUN 18, creatinine 1.1, glucose 164.  CK 47, MB 1.5, troponin I 0.07.  Calcium 8.8.   DISPOSITION:  Patient is being discharged home today in good condition.   FOLLOW UP PLANS AND APPOINTMENTS:  She will see Dr. Juanda Chance or [facility]  Cardiology, nurse practitioner, P.A. in approximately two weeks.  She is  asked to follow up with Dr. Sherwood Gambler in three to four weeks.   DISCHARGE MEDICATIONS:  1.  Aspirin 325 mg daily.  2.  Plavix 75 mg daily.  3.  Crestor 40 mg nightly.  4.  Imdur 30 mg daily.  5.  Lasix 40 mg daily.  6.  Lopressor 50 mg b.i.d.  7.  Nexium 40 mg daily.  8.  Cymbalta 60 mg daily.  9.  Glucophage 500 mg b.i.d. to be resumed October 07, 2005.  10. Glucotrol XL  10 mg daily.  11. Diovan/hydrochlorothiazide as previously prescribed.  12. Lyrica 50 mg t.i.d.  13. Nitroglycerin 0.4 mg sublingual p.r.n. chest pain.   OUTSTANDING LABORATORY STUDIES:  None.   DURATION OF DISCHARGE ENCOUNTER:  45 minutes.      Ok Anis, NP      Arvilla Meres, M.D. [facility]  Electronically Signed    CRB/MEDQ  D:  10/06/2005  T:  10/09/2005  Job:  [id number]   cc:   Madelin Rear. Sherwood Gambler, MD  Fax: [phone]

## 2011-03-09 NOTE — Discharge Summary (Signed)
NAME:  Meagan [name], Meagan [name]                        ACCOUNT NO.:  [id number]   MEDICAL RECORD NO.:  [id number]                   PATIENT TYPE:  INP   LOCATION:  3704                                 FACILITY:  [facility]   PHYSICIAN:  Charlies Constable, M.D.                  DATE OF BIRTH:  1946-06-06   DATE OF ADMISSION:  04/12/2003  DATE OF DISCHARGE:  04/13/2003                                 DISCHARGE SUMMARY   PRIMARY DIAGNOSIS:  Chest pain.   SECONDARY DIAGNOSIS:  1. Coronary artery disease status post CABG in 1997.  2. Hypertension.  3. Diabetes, new onset.  4. Hypercholesterolemia.  5. Fibromyalgia.  6. Gastroesophageal reflux disease.   HISTORY OF PRESENT ILLNESS:  This 65 year old female with the stated above  past medical history who was admitted with left shoulder pain, typically  does not have exertional chest pain.  For the past 10 days the patient has  had transient jaw pain and for the past 3 days complained of left shoulder  pain, now with increasing shortness of breath, nausea and vomiting,  diaphoresis not positional.  She was seen at Dr. Augusto Garbe office and was  referred to the emergency room.  The patient was admitted and had cardiac  enzymes performed.  Cardiac enzymes were negative.  The patient had no  further complaint of chest pain and was discharged to home in stable  condition, to follow with an outpatient Cardiolite this Thursday.   DISCHARGE MEDICATIONS:  The patient was discharged on all of her previous  medications.  1. Lopressor 50 b.i.d.  2. Crestor 40 daily.  3. Effexor XR 75 b.i.d.  4. Plavix 75 daily.  5. Diovan 25 daily.  6. Nexium 40 daily.  7. She was supplied with Imdur 30 mg daily for the next 2 weeks.  The Imdur     would be discontinued if the patient had a negative stress test and no     further symptoms after 2 weeks.  She was also given a new prescription     for nitroglycerine that she was to take p.r.n. for chest pain.   DISCHARGE  INSTRUCTIONS:  A stress test was scheduled for Thursday, June 24  at 1 p.m.  She was not to take any Lopressor that morning.  She was not to  eat or drink after 12 midnight.  The patient was not to do any heavy lifting  or strenuous activity until after her Cardiolite.  Low fat, low cholesterol,  low salt diet.   FOLLOW UP:  She was to follow with Dr. Juanda Chance on July 13 at 11 .m.    NOTE:  The patient had increased blood sugars during hospitalization off 282  and CBG of 340.  The patient was covered with sliding scale insulin.  The  patient had been seen by her primary care physician, Dr. Sherwood Gambler, for  management of her diabetes, and  she was to continue follow-up with him and  follow with his regime.     Chinita Pester, C.R.N.P. [facility]                 Charlies Constable, M.D.    DS/MEDQ  D:  04/13/2003  T:  04/14/2003  Job:  [id number]   cc:   Madelin Rear. Sherwood Gambler, M.D.  [address]  [place]  [place] [address]  Fax: [phone]    cc:   Madelin Rear. Sherwood Gambler, M.D.  [address]  [place]  [place] [address]  Fax: [phone]

## 2011-03-09 NOTE — Discharge Summary (Signed)
Meagan [name], Meagan [name]              ACCOUNT NO.:  [id number]   MEDICAL RECORD NO.:  [id number]          PATIENT TYPE:  INP   LOCATION:  A212                          FACILITY:  [facility]   PHYSICIAN:  Madelin Rear. Sherwood Gambler, MD  DATE OF BIRTH:  Nov 24, 1945   DATE OF ADMISSION:  01/09/2007  DATE OF DISCHARGE:  03/26/2008LH                               DISCHARGE SUMMARY   DISCHARGE MEDICATIONS:  1. Nexium 40 mg p.o. daily.  2. Vytorin 10/80 p.o. daily.  3. Lyrica 100 mg p.o. t.i.d.  4. Plavix 75 mg p.o. daily.  5. Imdur 30 mg p.o. daily.  6. Diovan/hydrochlorothiazide 25 mg daily.  7. Cymbalta 60 mg p.o. daily.  8. Prednisone 5 mg two daily 3 days, then one daily for 3 days, then      half a tablet daily for 3 days post discharge.  9. Glucotrol and Glucophage XL 20 mg p.o./1500 mg p.o. daily      respectively.  10.Levaquin 500 mg p.o. daily.   SUMMARY:  The patient was admitted with a history of diabetes mellitus,  coronary artery disease, and congestive heart failure with exacerbation  of acute congestion heart failure systolic.  There was a question of  pneumonia with a chest x-ray consistent with that diagnosis.  Blood  sugars were out of control.  D-dimer was elevated as well as a BNP at  155.  Theophylline was delivered with improvement in her symptoms.  Steroids were used with success, as were parenteral antibiotics.  She  was discharged in good condition post treatment stable pulmonary and  cardiovascular status.  She will be seen in the office 1 week followup.      Madelin Rear. Sherwood Gambler, MD  Electronically Signed     LJF/MEDQ  D:  01/27/2007  T:  01/27/2007  Job:  [id number]

## 2011-03-09 NOTE — Group Therapy Note (Signed)
[facility]  Patient:    Meagan [name], Meagan [name] Visit Number: [id number] MRN: [id number]          Service Type: OUT Location: [facility] Attending Physician:  Darlin Priestly Dictated by:   Kari Baars, M.D. Proc. Date: 02/18/02 Admit Date:  02/16/2002 Discharge Date: 02/16/2002                               Progress Note  REFERRING PHYSICIAN:  Colette Ribas, M.D.  PROBLEM:  Abnormal chest x-ray.  HISTORY:  The patient says that she is feeling fairly well.  She has no complaints.  I have discussed her with Dr. Phillips Odor and I do think that she probably has an atypical pneumonia.  She does have a right pleural effusion which is unusual with atypical pneumonias.  Considering that, I think it is reasonable for [place] to try to get radiology to help [place] with a thoracentesis and Dr. Phillips Odor agrees.  PLAN:  The plan then is for her have thoracentesis hopefully today, and I would plan to follow her along with her current medications and do a repeat chest x-ray one to two weeks after she is discharged. Dictated by:   Kari Baars, M.D. Attending Physician:  Darlin Priestly DD:  02/18/02 TD:  02/18/02 Job: [id number]

## 2011-03-12 ENCOUNTER — Telehealth: Payer: Self-pay | Admitting: Cardiology

## 2011-03-12 NOTE — Telephone Encounter (Signed)
Epidural Steroid Injection - Please clear to d/c ASA for three days / tg

## 2011-04-12 ENCOUNTER — Other Ambulatory Visit: Payer: Self-pay | Admitting: *Deleted

## 2011-04-12 MED ORDER — ISOSORBIDE MONONITRATE 30 MG PO TB24: 30.0000 mg | ORAL_TABLET | ORAL | Status: DC

## 2011-05-10 ENCOUNTER — Telehealth: Payer: Self-pay | Admitting: Internal Medicine

## 2011-05-10 NOTE — Telephone Encounter (Signed)
Per pt call she needs to be seen due to a program she is in at [facility]

## 2011-05-11 NOTE — Telephone Encounter (Signed)
Pt needed f/u appt and was told not available until Dec, sch pt in HF clinic on 8/6

## 2011-05-28 ENCOUNTER — Ambulatory Visit (HOSPITAL_COMMUNITY)
Admission: RE | Admit: 2011-05-28 | Discharge: 2011-05-28 | Disposition: A | Discharge status: Home / Self Care | Payer: Medicare Other | Source: Ambulatory Visit | Attending: Internal Medicine | Admitting: Internal Medicine

## 2011-05-28 VITALS — BP 130/48 | HR 75 | Ht 60.0 in | Wt 153.0 lb

## 2011-05-28 DIAGNOSIS — I2789 Other specified pulmonary heart diseases: Secondary | ICD-10-CM

## 2011-05-28 NOTE — Progress Notes (Signed)
HPI: Meagan [name] is a 65 y/o woman with multiple medical problems including CAD s/p CABG, DM2, obesity, fibromyalgia and diastolic HF. Referred by Dr. Diona Browner for further evaluation of pulmonary hypertension.  CABG 1997, Stents 2007  RHC 9/10: Bypass graft patent. RA 24 PA 110/31 (62) PCWP 34 Fick 3.3/1.8 PVR 8.5. Symptoms improved somewhat with diuresis.  CT chest 10/10: No PE or ILD PFTs 10/10: Restrictive lung disease FEV1 1.01L (56%) FVC 1.2 (48%) DLCO 24% Rheum work-up which was negative. Sleep study 11/10: negative for OSA  Saw Dr. Delton Coombes who felt PH was multifactorial but  Felt trial of Revatio was warranted. 2/11: started revatio. No apparent benefit. So it was stopped. Was hospitalized for respiratory failure and in 4/11: revatio restarted but subsequently stopped as it wasn't helping.   Had RHC 01/19/11: RA 12, RV 101/1/22 PA 99/19 (50) PCWP 18 Fick cardiac output 2.6, cardiac index of 1.5.  PVR 12.3  Femoral artery saturation was 94%.  PA saturation was 50% and 56% with a mean of 53.     Pulmonary angio of the right pulmonary artery showed a relatively normal angiogram except for question of a small cut off in sub-branch in the right lower lobe.  On the left, pulmonary artery appeared to have normal anatomy, no evidence of acute cut off or significant pruning. Plan was to refer to Dr. Bland Span at [facility] for consideration of IV prostaglandins.  Returns for routine f/u. Remains markedly SOB on any exertion. No edema. Occasional lightheadedness. +PND. No cough. Struggling with back pain. Has lost 30 pounds over past year. No appetite.   ROS: All other systems normal except as mentioned in HPI, past medical history and problem list.    Past Medical History  Diagnosis Date  . Fibromyalgia   . Diabetes mellitus   . Hypercholesterolemia   . Hypertension     pulmonary  pap 110/31 (mean 62)by RHC 9/10 negative PE by chest CT 2010  . Pulmonary edema     Failed Revatio   . ARF (acute renal  failure)     poor candidate for ACE -I or ARB  . Restrictive lung disease     obesity hypoventilation syndrome  . Renal insufficiency   . CAD (coronary artery disease)     multivessel DES x 2 RCA 2007  . Chronic diastolic heart failure   . MRSA (methicillin resistant staphylococcus aureus) pneumonia     Current Outpatient Prescriptions  Medication Sig Dispense Refill  . ALBUTEROL, 5 MG/ML, CONTINUOUS INHALATION SOLN Inhale 1 puff into the lungs.        . ALPRAZolam (XANAX) 0.5 MG tablet Take 0.5 mg by mouth at bedtime as needed.        Marland Kitchen aspirin 81 MG EC tablet Take 81 mg by mouth daily.        . cetirizine (ZYRTEC) 10 MG tablet Take 10 mg by mouth daily.        . DULoxetine (CYMBALTA) 60 MG capsule Take 60 mg by mouth daily.        Marland Kitchen ezetimibe-simvastatin (VYTORIN) 10-40 MG per tablet Take 1 tablet by mouth at bedtime.        . fentaNYL (DURAGESIC - DOSED MCG/HR) 25 MCG/HR Place 1 patch onto the skin every 3 (three) days.        . furosemide (LASIX) 40 MG tablet Take 2 tabs am 1 tab pm      . HYDROmorphone (DILAUDID) 4 MG tablet Take 4 mg by mouth every  4 (four) hours as needed.        . insulin glargine (LANTUS) 100 UNIT/ML injection Inject into the skin. Sliding scale       . isosorbide mononitrate (IMDUR) 30 MG CR tablet Take 1 tablet (30 mg total) by mouth every morning.  30 tablet  6  . L-Methylfolate-B6-B12 (METANX) 2.8-25-2 MG TABS Take by mouth as needed.        Marland Kitchen levothyroxine (SYNTHROID, LEVOTHROID) 25 MCG tablet Take 25 mcg by mouth daily.        . metoprolol (TOPROL-XL) 100 MG 24 hr tablet Take 100 mg by mouth daily.        Marland Kitchen omeprazole (PRILOSEC) 20 MG capsule Take 20 mg by mouth 2 (two) times daily.        . ondansetron (ZOFRAN) 4 MG tablet Take 4 mg by mouth every 8 (eight) hours as needed.        . Oxygen Permeable Lens Products SOLN by Does not apply route as needed. 3 liters       . PENTAZOCINE-NALOXONE PO as needed.        . potassium chloride (K-DUR) 10 MEQ tablet  Take 10 mEq by mouth daily.        Marland Kitchen Spacer/Aero-Holding Chambers (AEROCHAMBER MAX W/FLOW-VU) MISC by Does not apply route.        . traMADol (ULTRAM) 50 MG tablet Take 50 mg by mouth every 6 (six) hours as needed.           Allergies  Allergen Reactions  . Aspirin     REACTION: Intolerance  . Codeine   . Niacin   . Sulfonamide Derivatives     History   Social History  . Marital Status: Married    Spouse Name: N/A    Number of Children: N/A  . Years of Education: N/A   Occupational History  . Not on file.   Social History Main Topics  . Smoking status: Never Smoker   . Smokeless tobacco: Not on file  . Alcohol Use: No  . Drug Use: No  . Sexually Active:    Other Topics Concern  . Not on file   Social History Narrative  . No narrative on file    Family History  Problem Relation Age of Onset  . Cancer Mother     oral  . Heart attack Father   . COPD Brother   . Heart disease Brother     PHYSICAL EXAM: Filed Vitals:   05/28/11 1428  BP: 130/48  Pulse: 75  General:  Mildly chronically ill-appearing. Wearing O2 HEENT: normal Neck: thick . no JVD. Carotids 2+ bilat; no bruits. No lymphadenopathy or thryomegaly appreciated. Cor: PMI nonpalpable Regular rate & rhythm. No rubs, gallops or murmurs. P2 perhaps mildly accentuated but not severely so Lungs: clear but decreased throughout no wheezing Abdomen: obese. soft, nontender, nondistended. No hepatosplenomegaly. No bruits or masses. Good bowel sounds. Extremities: no cyanosis, clubbing, rash, edema Neuro: alert & oriented x 3, cranial nerves grossly intact. moves all 4 extremities w/o difficulty. Affect pleasant.   ASSESSMENT & PLAN:

## 2011-05-28 NOTE — Patient Instructions (Signed)
Your physician recommends that you schedule a follow-up appointment in: 2 months  

## 2011-05-28 NOTE — Assessment & Plan Note (Addendum)
Being followed closely by Dr. Monia Pouch in [facility] clinic. Now on Tyvaso. Feels she has not seen much benefit from it but I feel she is less short of breath in the clinic. Weight down 9 pounds. Chronic NYHA III. Suspect she will need trial of combination therapy with PDE-5 inhibitor though she has had problems with Revatio in past. Needs to go back to pulmonary rehab. Scheduled to go back to [facility] 8/26.

## 2011-07-26 LAB — URINALYSIS, ROUTINE W REFLEX MICROSCOPIC
Bilirubin Urine: NEGATIVE
Glucose, UA: NEGATIVE mg/dL
Ketones, ur: NEGATIVE mg/dL
Protein, ur: NEGATIVE mg/dL
pH: 6.5 (ref 5.0–8.0)

## 2011-07-26 LAB — POCT CARDIAC MARKERS
CKMB, poc: 1.1 ng/mL (ref 1.0–8.0)
Myoglobin, poc: 91.9 ng/mL (ref 12–200)
Troponin i, poc: <0.05 ng/mL (ref 0.00–0.09)

## 2011-07-26 LAB — CBC
HCT: 35.2 % — ABNORMAL LOW (ref 36.0–46.0)
Hemoglobin: 12.1 g/dL (ref 12.0–15.0)
MCHC: 34.3 g/dL (ref 30.0–36.0)
MCV: 91.9 fL (ref 78.0–100.0)
RDW: 13.6 % (ref 11.5–15.5)

## 2011-07-26 LAB — DIFFERENTIAL
Basophils Absolute: 0 10*3/uL (ref 0.0–0.1)
Basophils Relative: 0 % (ref 0–1)
Eosinophils Absolute: 0.1 10*3/uL (ref 0.0–0.7)
Eosinophils Relative: 2 % (ref 0–5)
Monocytes Absolute: 0.8 10*3/uL (ref 0.1–1.0)

## 2011-07-26 LAB — BASIC METABOLIC PANEL
BUN: 24 mg/dL — ABNORMAL HIGH (ref 6–23)
CO2: 29 mEq/L (ref 19–32)
Glucose, Bld: 134 mg/dL — ABNORMAL HIGH (ref 70–99)
Potassium: 3.7 mEq/L (ref 3.5–5.1)
Sodium: 138 mEq/L (ref 135–145)

## 2011-07-30 ENCOUNTER — Encounter (INDEPENDENT_AMBULATORY_CARE_PROVIDER_SITE_OTHER): Payer: Medicare Other

## 2011-07-30 ENCOUNTER — Ambulatory Visit (HOSPITAL_COMMUNITY): Payer: Medicare Other

## 2011-07-30 ENCOUNTER — Ambulatory Visit (HOSPITAL_COMMUNITY)
Admission: RE | Admit: 2011-07-30 | Discharge: 2011-07-30 | Disposition: A | Discharge status: Home / Self Care | Payer: Medicare Other | Source: Ambulatory Visit | Attending: Internal Medicine | Admitting: Internal Medicine

## 2011-07-30 ENCOUNTER — Other Ambulatory Visit: Payer: Self-pay

## 2011-07-30 ENCOUNTER — Encounter: Payer: Self-pay | Admitting: Internal Medicine

## 2011-07-30 VITALS — BP 151/50 | HR 73

## 2011-07-30 DIAGNOSIS — Z951 Presence of aortocoronary bypass graft: Secondary | ICD-10-CM | POA: Insufficient documentation

## 2011-07-30 DIAGNOSIS — Z79899 Other long term (current) drug therapy: Secondary | ICD-10-CM | POA: Insufficient documentation

## 2011-07-30 DIAGNOSIS — R002 Palpitations: Secondary | ICD-10-CM

## 2011-07-30 DIAGNOSIS — I4891 Unspecified atrial fibrillation: Secondary | ICD-10-CM

## 2011-07-30 DIAGNOSIS — R55 Syncope and collapse: Secondary | ICD-10-CM

## 2011-07-30 DIAGNOSIS — IMO0001 Reserved for inherently not codable concepts without codable children: Secondary | ICD-10-CM | POA: Insufficient documentation

## 2011-07-30 DIAGNOSIS — I5022 Chronic systolic (congestive) heart failure: Secondary | ICD-10-CM | POA: Insufficient documentation

## 2011-07-30 DIAGNOSIS — Z7982 Long term (current) use of aspirin: Secondary | ICD-10-CM | POA: Insufficient documentation

## 2011-07-30 DIAGNOSIS — I2510 Atherosclerotic heart disease of native coronary artery without angina pectoris: Secondary | ICD-10-CM

## 2011-07-30 DIAGNOSIS — E662 Morbid (severe) obesity with alveolar hypoventilation: Secondary | ICD-10-CM | POA: Insufficient documentation

## 2011-07-30 DIAGNOSIS — I2789 Other specified pulmonary heart diseases: Secondary | ICD-10-CM

## 2011-07-30 DIAGNOSIS — I10 Essential (primary) hypertension: Secondary | ICD-10-CM | POA: Insufficient documentation

## 2011-07-30 DIAGNOSIS — E119 Type 2 diabetes mellitus without complications: Secondary | ICD-10-CM | POA: Insufficient documentation

## 2011-07-30 DIAGNOSIS — E669 Obesity, unspecified: Secondary | ICD-10-CM | POA: Insufficient documentation

## 2011-07-30 NOTE — Progress Notes (Signed)
HPI: Meagan [name] is a 65 y/o woman with multiple medical problems including CAD s/p CABG, DM2, obesity, fibromyalgia and diastolic HF. Referred by Dr. Diona Browner for further evaluation of pulmonary hypertension.  CABG 1997, Stents 2007  LHC/RHC 9/10: Bypass graft patent. RA 24 PA 110/31 (62) PCWP 34 Fick 3.3/1.8 PVR 8.5. Symptoms improved somewhat with diuresis.  CT chest 10/10: No PE or ILD PFTs 10/10: Restrictive lung disease FEV1 1.01L (56%) FVC 1.2 (48%) DLCO 24% Rheum work-up which was negative. Sleep study 11/10: negative for OSA  Saw Dr. Delton Coombes who felt PH was multifactorial but  Felt trial of Revatio was warranted. 2/11: started revatio. No apparent benefit. So it was stopped. Was hospitalized for respiratory failure and in 4/11: revatio restarted but subsequently stopped as it wasn't helping.   Had RHC 01/19/11: RA 12, RV 101/1/22 PA 99/19 (50) PCWP 18 Fick cardiac output 2.6, cardiac index of 1.5.  PVR 12.3  Femoral artery saturation was 94%.  PA saturation was 50% and 56% with a mean of 53.     Pulmonary angio of the right pulmonary artery showed a relatively normal angiogram except for question of a small cut off in sub-branch in the right lower lobe.  On the left, pulmonary artery appeared to have normal anatomy, no evidence of acute cut off or significant pruning. Plan was to refer to Dr. Bland Span at [facility] for consideration of IV prostaglandins. She was started on Tyvaso.   Returns for routine f/u. She has had two syncopal episodes over the last two weeks. During the first episode was walking in the house after trying to fill her O2 tank. Husband heard her fall and she apparently fell face forward. No warning signs or injury. Doesn't remember it.  During second episode had walked out of restaurant and felt funny all over and lightheaded. Sat down in car and felt better. But when she got out of car she felt funny again and passed out. Husband says he thinks she ran out of O2.   She is  trying to attend pulmonary rehab 2-3 times a week. Says that they told her she had 1 minute of atrial fib last week. Remains markedly SOB on any exertion. She continues on Tyvaso - doing 12 inhalations 4x/day. No edema.  +PND. Weight at home has been 151-154 pounds. No appetite.   Scheduled to see Dr. Monia Pouch back on October 23. She refuses to consider IV therapies.   ROS: All other systems normal except as mentioned in HPI, past medical history and problem list.    Past Medical History  Diagnosis Date  . Fibromyalgia   . Diabetes mellitus   . Hypercholesterolemia   . Hypertension     pulmonary  pap 110/31 (mean 62)by RHC 9/10 negative PE by chest CT 2010  . Pulmonary edema     Failed Revatio   . ARF (acute renal failure)     poor candidate for ACE -I or ARB  . Restrictive lung disease     obesity hypoventilation syndrome  . Renal insufficiency   . CAD (coronary artery disease)     multivessel DES x 2 RCA 2007  . Chronic diastolic heart failure   . MRSA (methicillin resistant staphylococcus aureus) pneumonia     Current Outpatient Prescriptions  Medication Sig Dispense Refill  . ALBUTEROL, 5 MG/ML, CONTINUOUS INHALATION SOLN Inhale 1 puff into the lungs.        . ALPRAZolam (XANAX) 0.5 MG tablet Take 0.5 mg by mouth at  bedtime as needed.        Marland Kitchen aspirin 81 MG EC tablet Take 81 mg by mouth daily.        . cetirizine (ZYRTEC) 10 MG tablet Take 10 mg by mouth daily.        . DULoxetine (CYMBALTA) 60 MG capsule Take 60 mg by mouth daily.        Marland Kitchen ezetimibe-simvastatin (VYTORIN) 10-40 MG per tablet Take 1 tablet by mouth at bedtime.        . fentaNYL (DURAGESIC - DOSED MCG/HR) 25 MCG/HR Place 1 patch onto the skin as needed.       . furosemide (LASIX) 40 MG tablet Take 2 tabs am 1 tab pm      . HYDROmorphone (DILAUDID) 4 MG tablet Take 4 mg by mouth every 4 (four) hours as needed.        . insulin glargine (LANTUS) 100 UNIT/ML injection Inject into the skin. Sliding scale       .  isosorbide mononitrate (IMDUR) 30 MG CR tablet Take 1 tablet (30 mg total) by mouth every morning.  30 tablet  6  . L-Methylfolate-B6-B12 (METANX) 2.8-25-2 MG TABS Take by mouth as needed.        Marland Kitchen levothyroxine (SYNTHROID, LEVOTHROID) 25 MCG tablet Take 25 mcg by mouth daily.        . metoprolol (TOPROL-XL) 100 MG 24 hr tablet Take 100 mg by mouth daily.        Marland Kitchen omeprazole (PRILOSEC) 20 MG capsule Take 20 mg by mouth 2 (two) times daily.        . ondansetron (ZOFRAN) 4 MG tablet Take 4 mg by mouth every 8 (eight) hours as needed.        . Oxygen Permeable Lens Products SOLN by Does not apply route as needed. 3 liters       . PENTAZOCINE-NALOXONE PO as needed.        . potassium chloride (K-DUR) 10 MEQ tablet Take 10 mEq by mouth daily.        Marland Kitchen Spacer/Aero-Holding Chambers (AEROCHAMBER MAX W/FLOW-VU) MISC by Does not apply route.        . traMADol (ULTRAM) 50 MG tablet Take 50 mg by mouth every 6 (six) hours as needed.           Allergies  Allergen Reactions  . Aspirin     REACTION: Intolerance  . Codeine   . Niacin   . Sulfonamide Derivatives     History   Social History  . Marital Status: Married    Spouse Name: N/A    Number of Children: N/A  . Years of Education: N/A   Occupational History  . Not on file.   Social History Main Topics  . Smoking status: Never Smoker   . Smokeless tobacco: Not on file  . Alcohol Use: No  . Drug Use: No  . Sexually Active:    Other Topics Concern  . Not on file   Social History Narrative  . No narrative on file    Family History  Problem Relation Age of Onset  . Cancer Mother     oral  . Heart attack Father   . COPD Brother   . Heart disease Brother     PHYSICAL EXAM: Filed Vitals:   07/30/11 1425  BP: 151/50  Pulse: 73  General:  Mildly chronically ill-appearing. Wearing 5L [place] O2 HEENT: normal Neck: thick . no JVD. Carotids 2+ bilat; no bruits. No  lymphadenopathy or thryomegaly appreciated. Cor: PMI nonpalpable  Regular rate & rhythm. No rubs, gallops or murmurs. P2 perhaps mildly accentuated but not severely so Lungs: clear RML and RLL decreased throughout no wheezing Abdomen: obese. soft, nontender, nondistended. No hepatosplenomegaly. No bruits or masses. Good bowel sounds. Extremities: no cyanosis, clubbing, rash, edema Neuro: alert & oriented x 3, cranial nerves grossly intact. moves all 4 extremities w/o difficulty. Affect pleasant.  ECG: Sinus brady 58 TWI v1-v4, inferiorly (more prominent than previous)  ASSESSMENT & PLAN:

## 2011-07-30 NOTE — Assessment & Plan Note (Signed)
NYHA III. Volume status stable.

## 2011-07-30 NOTE — Assessment & Plan Note (Addendum)
She appears to have worsening symptoms now with several episodes of syncope (WHO Fx class IIIB-IV) despite Tyvaso. Will plan repeat RHC to further evaluate. Refuses Flolan at this point. May consider addition of bosentan. Will see Dr. Monia Pouch at [facility] in 2-3 weeks.

## 2011-07-30 NOTE — Patient Instructions (Signed)
Your physician has requested that you have an echocardiogram. Echocardiography is a painless test that uses sound waves to create images of your heart. It provides your doctor with information about the size and shape of your heart and how well your heart's chambers and valves are working. This procedure takes approximately one hour. There are no restrictions for this procedure.  Scheduled for 10/16 at 10:30 at [facility]  Your physician has recommended that you wear an event monitor. Event monitors are medical devices that record the heart's electrical activity. Doctors most often [place] these monitors to diagnose arrhythmias. Arrhythmias are problems with the speed or rhythm of the heartbeat. The monitor is a small, portable device. You can wear one while you do your normal daily activities. This is usually used to diagnose what is causing palpitations/syncope (passing out).  You can go to [facility] now to have monitor put on.  Your physician has requested that you have a lexiscan myoview. For further information please visit [email or web address] Please follow instruction sheet, as given.  Scheduled for 10/16 at [facility]  Your physician recommends that you schedule a follow-up appointment in: 1 week

## 2011-07-31 DIAGNOSIS — R002 Palpitations: Secondary | ICD-10-CM | POA: Insufficient documentation

## 2011-07-31 NOTE — Assessment & Plan Note (Signed)
No CP but ECG with new diffuse TWI. Will plan to do coronary angio at time of RHC.

## 2011-07-31 NOTE — Assessment & Plan Note (Signed)
Will attempt to get strip from pulmonary rehab to document AF. If so, she will need coumadin. Plan event monitor as well.

## 2011-08-01 ENCOUNTER — Telehealth (HOSPITAL_COMMUNITY): Payer: Self-pay | Admitting: *Deleted

## 2011-08-01 ENCOUNTER — Encounter (HOSPITAL_COMMUNITY): Payer: Self-pay | Admitting: *Deleted

## 2011-08-01 ENCOUNTER — Telehealth: Payer: Self-pay | Admitting: Internal Medicine

## 2011-08-01 NOTE — Telephone Encounter (Signed)
Spoke w/pt regarding cath

## 2011-08-01 NOTE — Telephone Encounter (Signed)
Pt has to ask another question about procedure please call back

## 2011-08-01 NOTE — Telephone Encounter (Signed)
Dr Gala Romney has decided that pt needs a right and left heart cath instead of an echo and myoview, test cancelled and cath scheduled for Fri 10/12 at 9 am

## 2011-08-01 NOTE — Telephone Encounter (Signed)
Cath instructions reviewed with pt via phone, she verbalized understanding

## 2011-08-03 ENCOUNTER — Ambulatory Visit (HOSPITAL_COMMUNITY)
Admission: RE | Admit: 2011-08-03 | Discharge: 2011-08-04 | Disposition: A | Discharge status: Nursing Home (Includes ICF) | Payer: Medicare Other | Source: Ambulatory Visit | Attending: Internal Medicine | Admitting: Internal Medicine

## 2011-08-03 DIAGNOSIS — Z23 Encounter for immunization: Secondary | ICD-10-CM | POA: Insufficient documentation

## 2011-08-03 DIAGNOSIS — E119 Type 2 diabetes mellitus without complications: Secondary | ICD-10-CM | POA: Insufficient documentation

## 2011-08-03 DIAGNOSIS — I2510 Atherosclerotic heart disease of native coronary artery without angina pectoris: Secondary | ICD-10-CM

## 2011-08-03 DIAGNOSIS — I2789 Other specified pulmonary heart diseases: Secondary | ICD-10-CM | POA: Insufficient documentation

## 2011-08-03 DIAGNOSIS — I5030 Unspecified diastolic (congestive) heart failure: Secondary | ICD-10-CM | POA: Insufficient documentation

## 2011-08-03 LAB — BASIC METABOLIC PANEL
BUN: 17 mg/dL (ref 6–23)
CO2: 30 mEq/L (ref 19–32)
Calcium: 9.4 mg/dL (ref 8.4–10.5)
Glucose, Bld: 84 mg/dL (ref 70–99)
Sodium: 139 mEq/L (ref 135–145)

## 2011-08-03 LAB — POCT I-STAT 3, VENOUS BLOOD GAS (G3P V)
Acid-Base Excess: 1 mmol/L (ref 0.0–2.0)
Bicarbonate: 28.7 mEq/L — ABNORMAL HIGH (ref 20.0–24.0)
pH, Ven: 7.305 — ABNORMAL HIGH (ref 7.250–7.300)
pO2, Ven: 33 mmHg (ref 30.0–45.0)

## 2011-08-03 LAB — CBC
MCH: 27.4 pg (ref 26.0–34.0)
MCV: 83.9 fL (ref 78.0–100.0)
Platelets: 302 10*3/uL (ref 150–400)
RBC: 3.98 MIL/uL (ref 3.87–5.11)

## 2011-08-03 LAB — GLUCOSE, CAPILLARY: Glucose-Capillary: 88 mg/dL (ref 70–99)

## 2011-08-03 NOTE — Cardiovascular Report (Signed)
  NAMELORIEL, Meagan [name] NO.:  [id number]  MEDICAL RECORD NO.:  [id number]  LOCATION:  2919                         FACILITY:  [facility]  PHYSICIAN:  Verne Carrow, MDDATE OF BIRTH:  1946-01-14  DATE OF PROCEDURE:  08/03/2011 DATE OF DISCHARGE:                           CARDIAC CATHETERIZATION   PRIMARY CARDIOLOGIST:  Bevelyn Buckles. Bensimhon, MD  PROCEDURE PERFORMED:  Successful percutaneous transluminal coronary angioplasty with placement of a drug-eluting stent in the mid right coronary artery.  OPERATOR:  Verne Carrow, MD  INDICATION:  This is a pleasant 65 year old Caucasian female with multiple medical problems including coronary artery disease status post CABG, diabetes mellitus, fibromyalgia, diastolic heart failure, and recently diagnosed pulmonary hypertension who underwent a diagnostic catheterization by Dr. Arvilla Meres earlier today.  The patient was found to have severe in-stent restenosis in the stented portion of the mid right coronary artery.  I was asked to come in and perform the intervention.  DETAILS OF PROCEDURE:  When I entered the case, the patient had a 5- [place] sheath present in the right femoral artery.  There was a 7-French sheath present in the right femoral vein.  The arterial sheath was switched out for a 6-French sheath.  The patient was then given a bolus of Angiomax and a drip was started.  The patient was given 600 mg of Plavix.  The patient had taken aspirin 325 mg earlier this morning. When the ACT was greater than 200, I engaged the native right coronary artery with a 6-French JR-4 guiding catheter.  A cougar intracoronary wire was passed down the length of the right coronary artery into the distal vessel.  A 2.5 x 20 mm balloon was inflated 3 different times in the mid vessel within the previously stented segment.  A 2.5 x 20 mm noncompliant balloon was inflated twice in the mid stented segment.  A 2.5 x 38  mm Promus Element drug-eluting stent was carefully positioned in the mid vessel.  I actually placed the stent just distal to the most- distal point of the previously placed stent.  The stent was deployed without difficulty.  A 2.75 x 20 mm noncompliant balloon was inflated twice inside the newly stented area.  There was an excellent angiographic result.  The stenosis was taken from 80% to 0%.  There were no immediate complications.  The patient was taken to the recovery area with both the arterial sheath and venous sheath in place.  IMPRESSION:  Successful percutaneous coronary intervention with placement of a drug-eluting stent x1 in the mid right coronary artery.  RECOMMENDATIONS:  I recommend the patient be on dual anti-platelet therapy with aspirin and Plavix for at least a year.  Dr. Gala Romney will continue her other medications including a statin.  We will run Angiomax for 2 hours and then stop.     Verne Carrow, MD     CM/MEDQ  D:  08/03/2011  T:  08/03/2011  Job:  [id number]  Electronically Signed by Verne Carrow MD on 08/03/2011 03:57:28 PM

## 2011-08-04 LAB — CBC
HCT: 27 % — ABNORMAL LOW (ref 36.0–46.0)
MCV: 82.3 fL (ref 78.0–100.0)
RBC: 3.28 MIL/uL — ABNORMAL LOW (ref 3.87–5.11)
WBC: 17.2 10*3/uL — ABNORMAL HIGH (ref 4.0–10.5)

## 2011-08-04 LAB — BASIC METABOLIC PANEL
BUN: 15 mg/dL (ref 6–23)
CO2: 30 mEq/L (ref 19–32)
Chloride: 100 mEq/L (ref 96–112)
Creatinine, Ser: 0.9 mg/dL (ref 0.50–1.10)
Glucose, Bld: 138 mg/dL — ABNORMAL HIGH (ref 70–99)

## 2011-08-05 NOTE — Cardiovascular Report (Signed)
Meagan [name], BICK NO.:  [id number]  MEDICAL RECORD NO.:  [id number]  LOCATION:  [facility]                         FACILITY:  [facility]  PHYSICIAN:  Bevelyn Buckles. Bensimhon, MDDATE OF BIRTH:  06-22-1946  DATE OF PROCEDURE:  08/03/2011 DATE OF DISCHARGE:                           CARDIAC CATHETERIZATION   Ms. Cressy is a 65 year old woman with history of coronary artery disease status post bypass surgery in 1997.  She also had stents to the right coronary artery in 2007.  She has had idiopathic pulmonary hypertension.  She has previously refused IV therapies.  She has been treated with __________.  Her right heart catheterization in March 2012 showed a PA pressure of 99/19 with a mean of 50 and a pulmonary capillary wedge of 18.  Cardiac index was 1.5.  She has had a workup for chronic thromboembolic disease and this has been negative.  She presented to clinic recently with worsening functional capacity and 2 episodes of syncope as well as dyspnea with new T-wave inversions.  She was brought for right and left heart catheterization.  PROCEDURES PERFORMED: 1. Selective coronary angiography. 2. Left heart catheterization. 3. Left ventriculogram. 4. Right heart catheterization.  DESCRIPTION OF PROCEDURE:  The risks and indications of the procedure were explained.  Consent was signed, placed on the chart.  The right groin area was prepped and draped in routine sterile fashion, anesthetized with 1% local lidocaine.  A 5-French arterial sheath was placed in the right femoral artery and a 7-French venous sheath was placed in the right femoral vein using a modified Seldinger technique. Standard catheters including JL-4, JR-4, angled pigtail were used.  All catheterization is made over wire.  A Swan-Ganz catheter was used for the right heart cath.  Simultaneous right and left heart pressures were assessed.  There were no apparent complications.  Right atrial pressure mean  of 24, RV pressure 132/14 with an EDP of 27. PA pressure 131/30 with a mean of 65.  Pulmonary capillary wedge pressure mean of 21.  Central aortic pressure 150/69 with a mean of 99, LV pressure 155/8 with EDP of 18.  Fick cardiac output was 3.6. Cardiac index 2.2.  Thermodilution cardiac output 2.8, cardiac index 1.7. Pulmonary vascular resistance was 12.2 Woods units by Fick and 15.7 Woods units by thermodilution.  Saturations were femoral artery 91% on 5 liters.  Pulmonary artery was 53% and 56%.  FVC was 50%.  On simultaneous right and left heart pressures, there was no evidence of restriction or constriction.  Coronary anatomy.  Left main was normal.  LAD had moderate diffuse disease.  There was a 90% lesion in the proximal midsection.  It was mid to distal vessel filled from competitive flow from the LAD.  There were 2 diagonals which had moderate disease.  Left circumflex was a small heavily diseased vessel that had about a 30- 40% lesion ostially and a long 99% lesion in the midsection and severe diffuse disease distally.  The OM1 was small and diffusely diseased. The OM2 was moderate sized subtotal vessel.  There is a posterolateral which was occluded proximally.  Right coronary artery was a large dominant vessel, gave off a large bifurcating RV  branch, a PDA, and several posterolaterals.  There was a long area of stenting through the midsection.  Just prior to the stent, there is a 40% lesion in the middle of the stent just after the RV branch.  There was an 80% hypodense lesion.  There is a 30% lesion distally prior to the PDA.  LIMA to the LAD was widely patent and filled the mid-to-distal LAD well.  Saphenous vein graft to the OM 3 or posterolateral was patent.  There was about a 60-70% stenosis in the midsection of the graft.  This was not flow-limiting.  Left ventriculogram done in the RAO position showed an EF of 60% with no regional wall motion  abnormalities.  ASSESSMENT: 1. Severe coronary artery disease with high-grade in-stent restenosis     in the mid right coronary artery.  There is moderate disease in the     saphenous vein graft to the obtuse marginal 3.  This appears     stable. 2. Normal left ventricular function. 3. Severe pulmonary arterial hypertension with evidence of cor     pulmonale. 4. No evidence of intracardiac shunt. 5. No evidence of restriction or constriction.  This is a very difficult situation.  Her main issue is worsening pulmonary arterial hypertension now with end-stage cor pulmonale.  She has previously refused Flolan in the past.  However, I discussed this with her again and she is agreeable to transfer to [facility] to have this initiated.  She has been followed by Dr. Monia Pouch in the past.  I have contacted Dr. Monia Pouch and we are arranging for transfer.  With regard to her coronary arteries, her right coronary artery lesion is significant and may be adding to right ventricular ischemia.  I discussed this with Dr. Monia Pouch as well as our interventional doctor here.  We will plan percutaneous coronary intervention on the in-stent restenosis of the mid right coronary artery.  With regard to her disposition, she will be kept overnight for observation post PCI.  If there is a bed available at [facility] today or tomorrow, we will transfer her directly.  If there is no bed this weekend, I believe she is stable enough to go home tomorrow and be followed up at [facility] as an outpatient for initiation of Flolan.  We have made contact with the transfer center at [facility] at [phone] for further questions.     Bevelyn Buckles. Bensimhon, MD     DRB/MEDQ  D:  08/03/2011  T:  08/03/2011  Job:  [id number]  cc:   Jonelle Sidle, MD Marlane Mingle  Electronically Signed by Arvilla Meres MD on 08/05/2011 02:50:55 PM

## 2011-08-06 ENCOUNTER — Encounter (HOSPITAL_COMMUNITY): Payer: Medicare Other

## 2011-08-06 LAB — POCT I-STAT 3, ART BLOOD GAS (G3+)
Acid-Base Excess: 4 mmol/L — ABNORMAL HIGH (ref 0.0–2.0)
Bicarbonate: 30.6 mEq/L — ABNORMAL HIGH (ref 20.0–24.0)
O2 Saturation: 91 %
pO2, Arterial: 64 mmHg — ABNORMAL LOW (ref 80.0–100.0)

## 2011-08-06 LAB — POCT I-STAT 3, VENOUS BLOOD GAS (G3P V)
Acid-Base Excess: 2 mmol/L (ref 0.0–2.0)
Acid-Base Excess: 6 mmol/L — ABNORMAL HIGH (ref 0.0–2.0)
O2 Saturation: 53 %
TCO2: 35 mmol/L (ref 0–100)
pH, Ven: 7.345 — ABNORMAL HIGH (ref 7.250–7.300)
pO2, Ven: 31 mmHg (ref 30.0–45.0)

## 2011-08-07 ENCOUNTER — Other Ambulatory Visit (HOSPITAL_COMMUNITY): Payer: Medicare Other | Admitting: Radiology

## 2011-08-08 ENCOUNTER — Ambulatory Visit (HOSPITAL_COMMUNITY): Payer: Medicare Other

## 2011-10-02 ENCOUNTER — Other Ambulatory Visit: Payer: Self-pay | Admitting: *Deleted

## 2011-10-03 ENCOUNTER — Other Ambulatory Visit: Payer: Self-pay | Admitting: *Deleted

## 2011-10-03 MED ORDER — EZETIMIBE-SIMVASTATIN 10-40 MG PO TABS: 1.0000 | ORAL_TABLET | Freq: Every day | ORAL | Status: DC

## 2011-10-05 IMAGING — CR DG CHEST 1V PORT
1 series · 1 of 1 positions shown · non-contrast
Comparison: Prior today

CLINICAL DATA: Acute respiratory failure.  Altered mental status.
Intubation.  Coronary artery disease.

PORTABLE CHEST - 1 VIEW

[view not recorded]
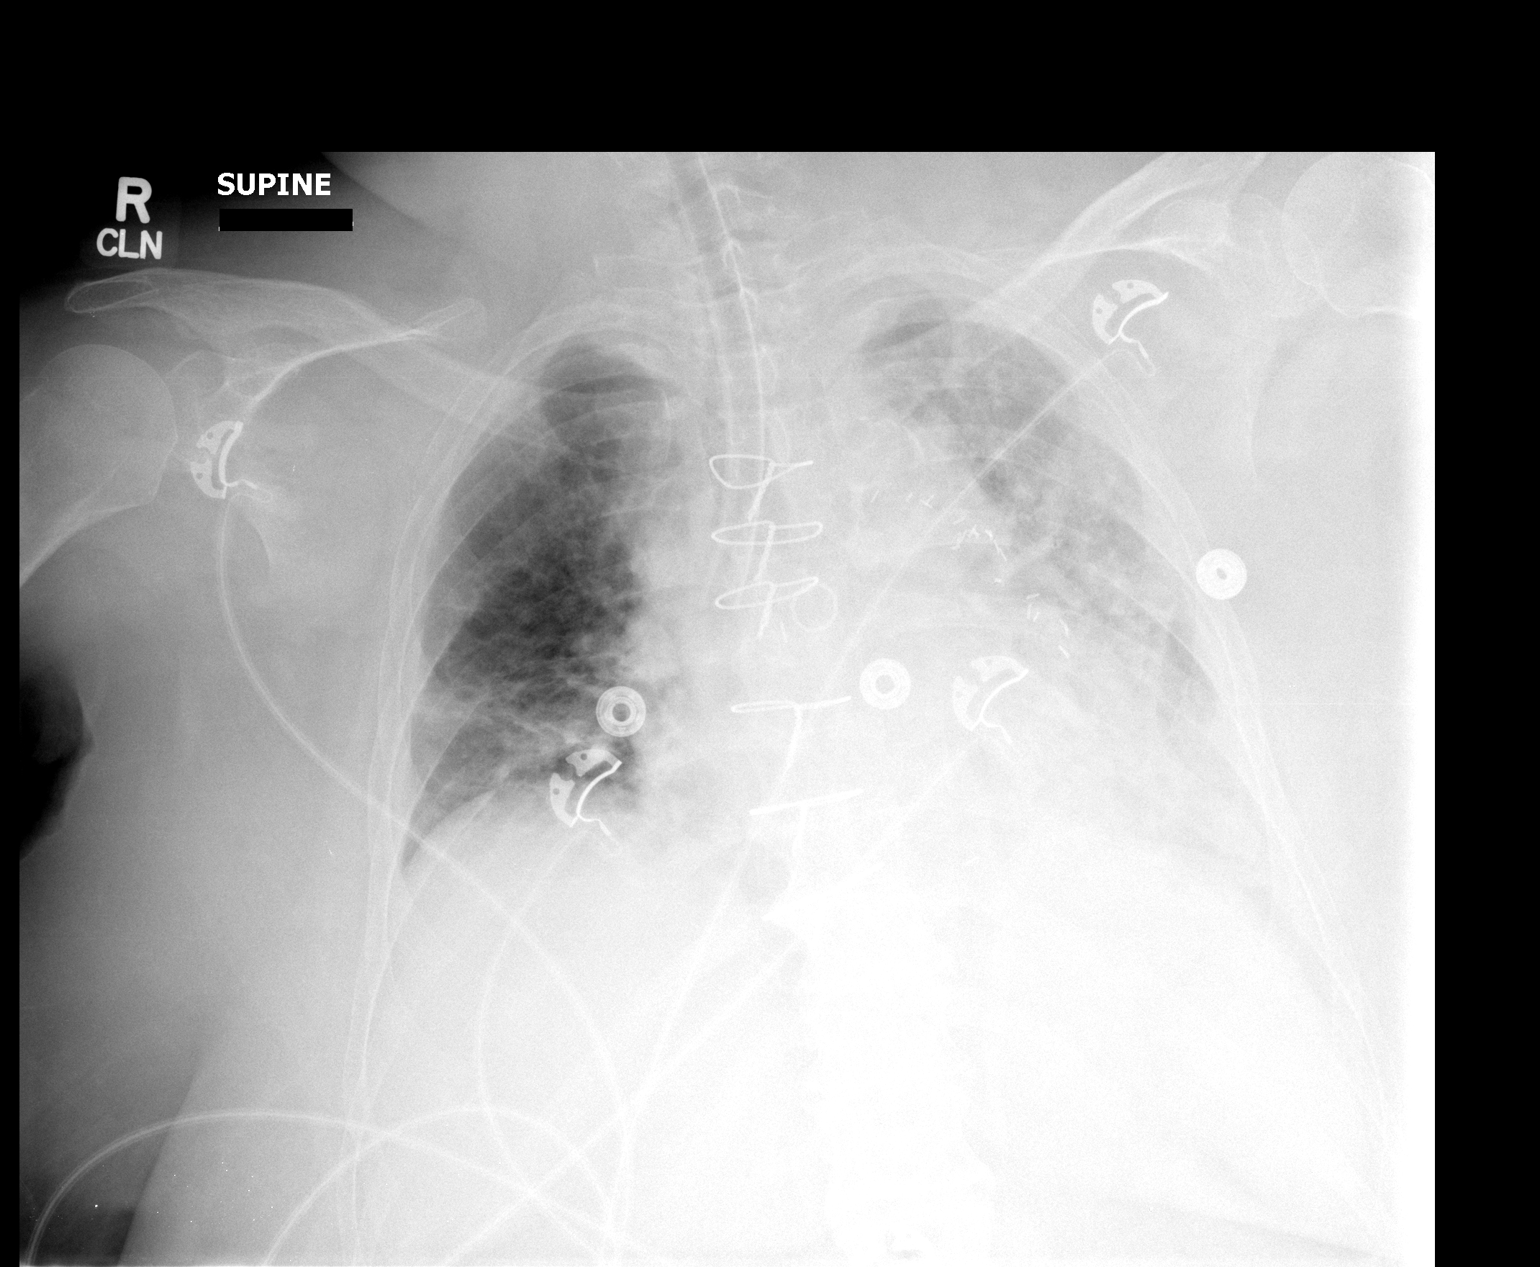

[1 of 1 positions shown; findings below may reference images not displayed]

FINDINGS: Endotracheal tube is seen with tip in the right mainstem
bronchus.  This should be pulled back by approximately 4-5 cm.

Low lung volumes are again seen bilaterally with bibasilar
atelectasis.  No evidence of pulmonary consolidation or pleural
effusion.  Heart size is stable.  Previous coronary bypass grafting
noted.
IMPRESSION: Endotracheal tube and right mainstem bronchus.  This should be
pulled back by approximately 4-5 cm.  Low lung volumes and
bibasilar atelectasis.

## 2011-10-06 IMAGING — CT CT HEAD W/O CM
1 of 2 series · 16 of 30 positions shown, 20 images · non-contrast
Comparison: 12/01/2009

CLINICAL DATA: Altered mental status.

CT HEAD WITHOUT CONTRAST
TECHNIQUE: Contiguous axial images were obtained from the base of
the skull through the vertex without contrast.

[Series 2: head routine 4.8 h37s · axial · 0.46mm/px · z∈[-196,-61]mm · 16 of 30 slices shown, 20 images]
[im 2/30  brain]
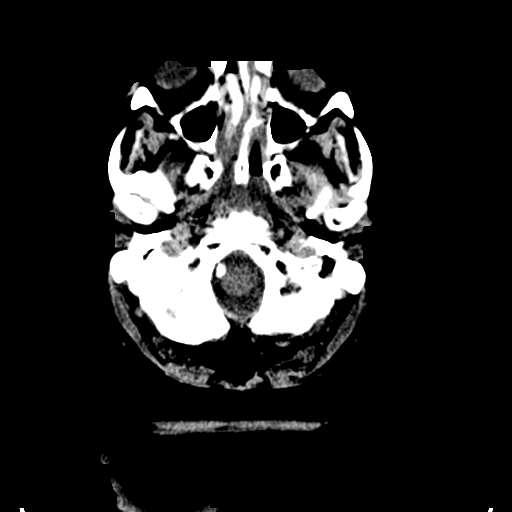
[im 2/30  bone]
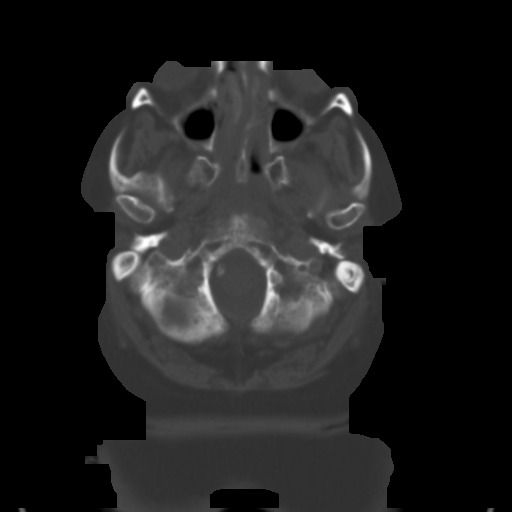
[im 4/30  brain]
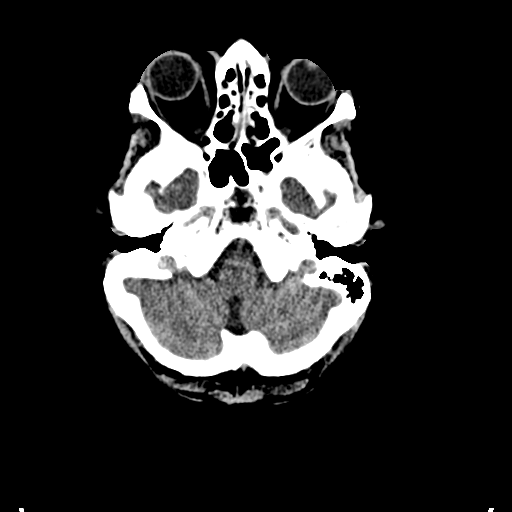
[im 5/30  brain]
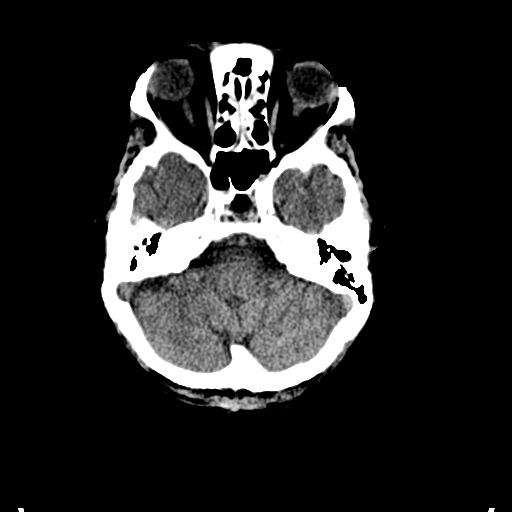
[im 8/30  brain]
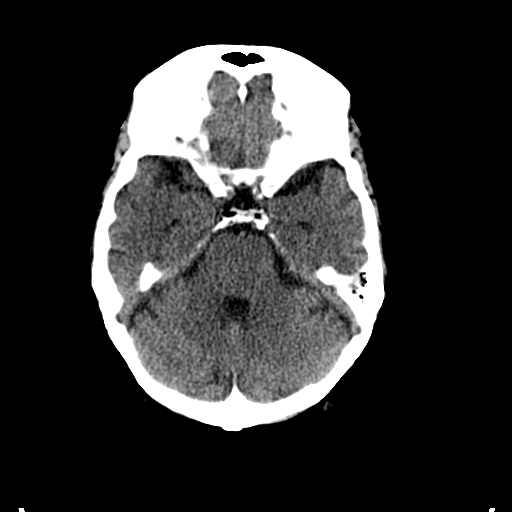
[im 9/30  brain]
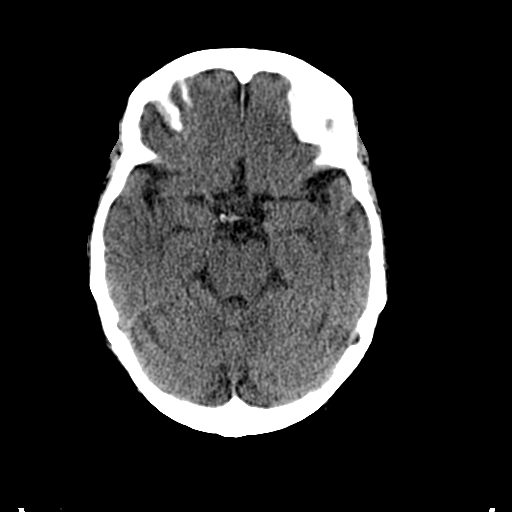
[im 9/30  bone]
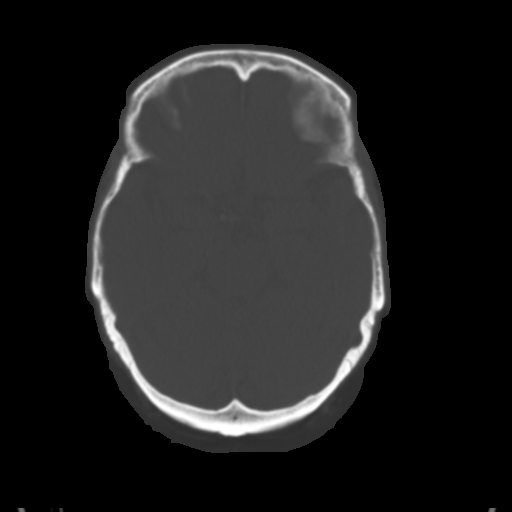
[im 10/30  brain]
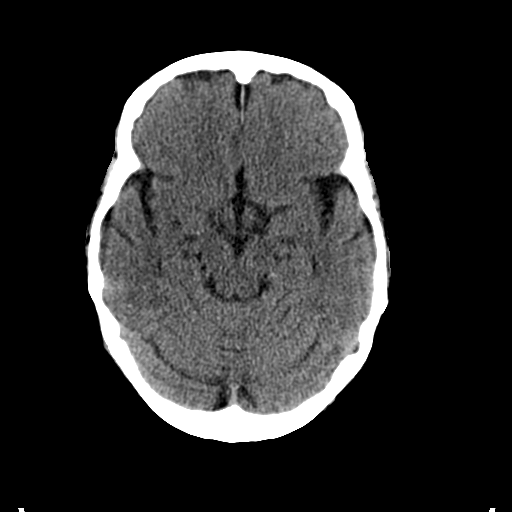
[im 13/30  brain]
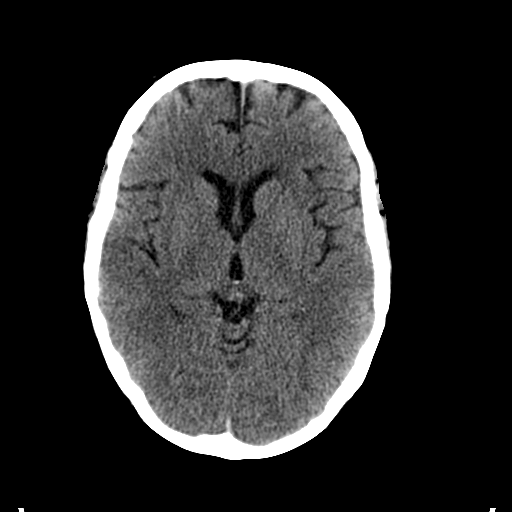
[im 14/30  brain]
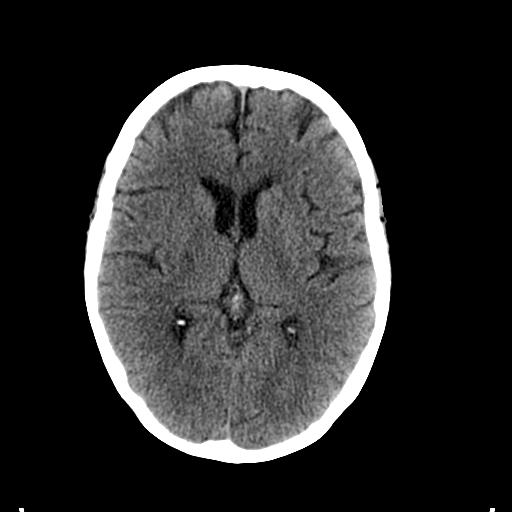
[im 16/30  brain]
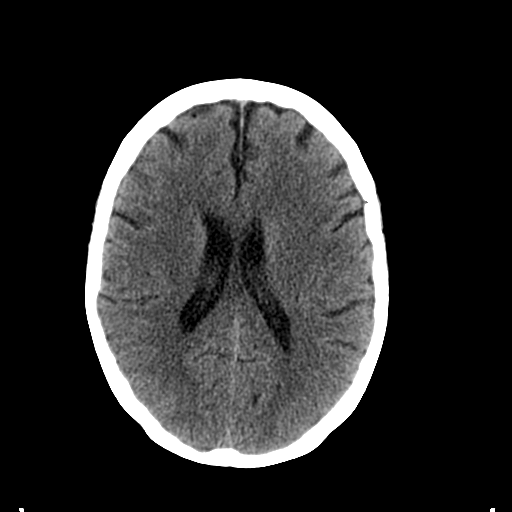
[im 16/30  bone]
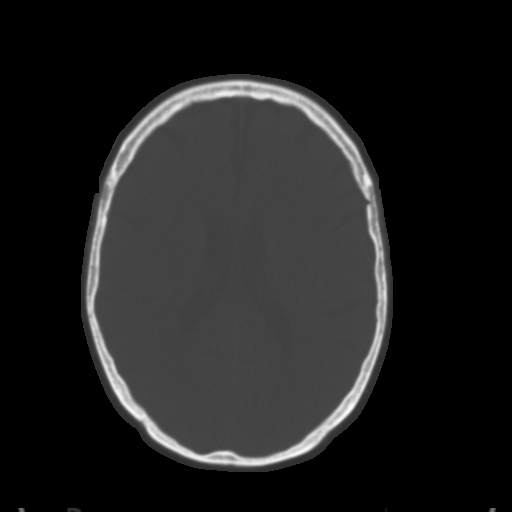
[im 17/30  brain]
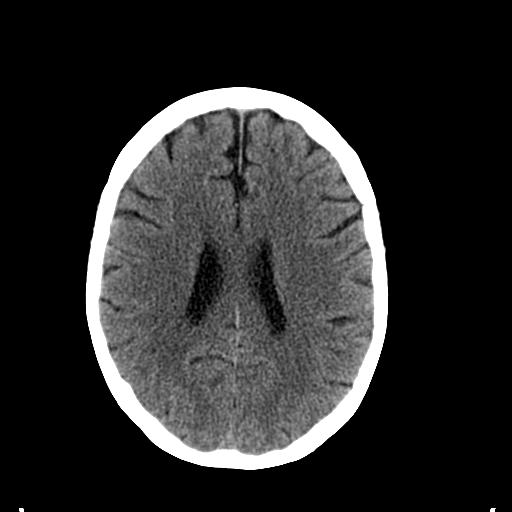
[im 20/30  brain]
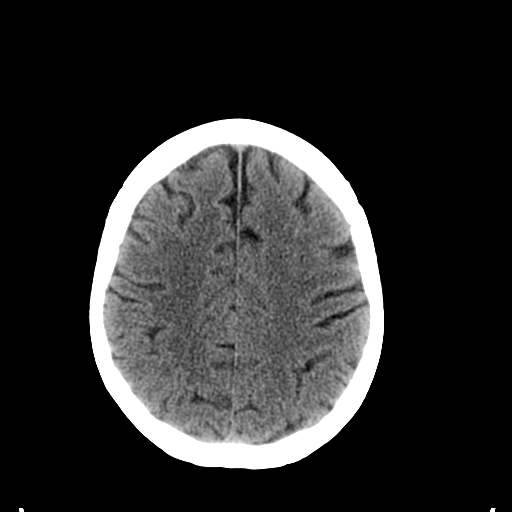
[im 21/30  brain]
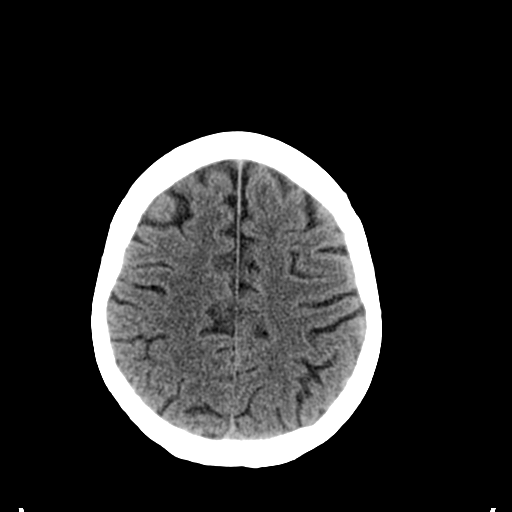
[im 22/30  brain]
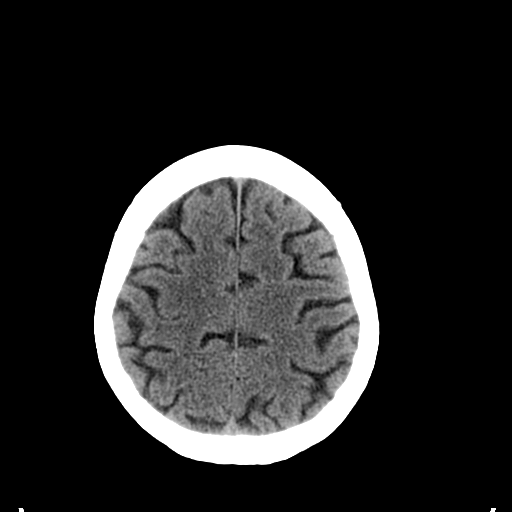
[im 22/30  bone]
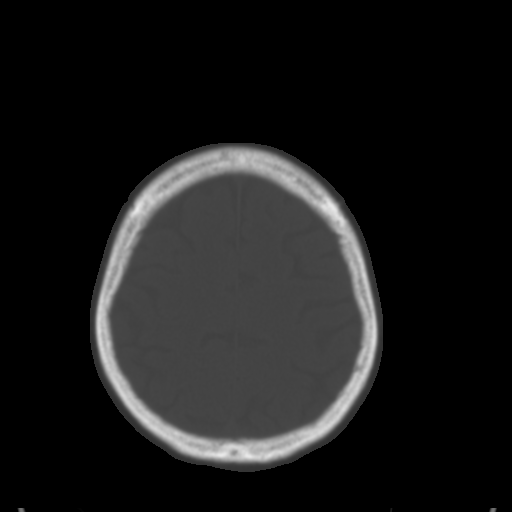
[im 25/30  brain]
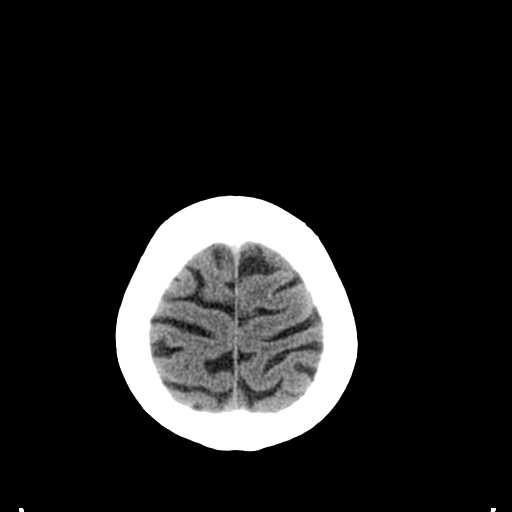
[im 26/30  brain]
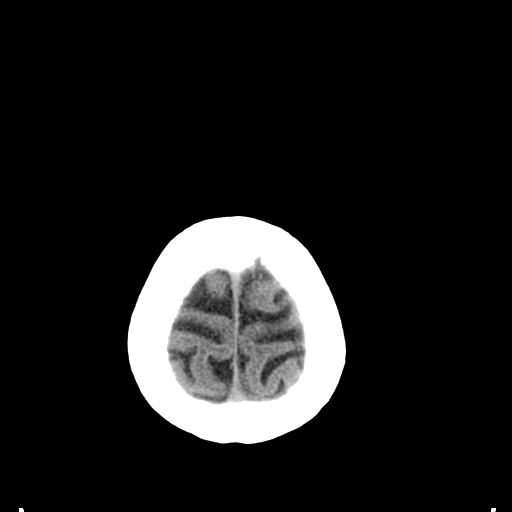
[im 28/30  brain]
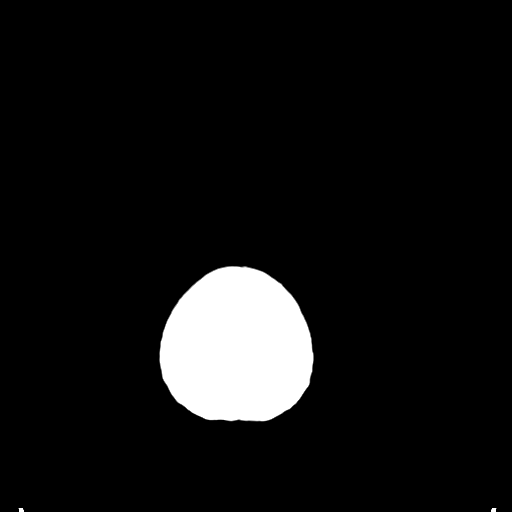

[16 of 30 positions shown; findings below may reference images not displayed]

FINDINGS: There is no evidence of intracranial hemorrhage, brain
edema or other signs of acute infarction.  There is no evidence of
intracranial mass lesion or mass effect.  No abnormal extra-axial
fluid collections are identified.

The ventricles are normal in size.  No other intracranial
abnormality identified.  No skull abnormality noted.  Mucosal
thickening again seen involving the ethmoid sinuses, and to a
lesser degree in the maxillary sinuses, consistent with chronic
sinusitis.
IMPRESSION: 1.  No acute intracranial abnormality.
2.  Chronic sinusitis.

## 2011-10-30 ENCOUNTER — Emergency Department (HOSPITAL_COMMUNITY): Payer: Medicare Other

## 2011-10-30 ENCOUNTER — Encounter (HOSPITAL_COMMUNITY): Payer: Self-pay

## 2011-10-30 ENCOUNTER — Emergency Department (HOSPITAL_COMMUNITY)
Admission: EM | Admit: 2011-10-30 | Discharge: 2011-10-30 | Disposition: A | Discharge status: Home / Self Care | Payer: Medicare Other | Attending: Emergency Medicine | Admitting: Emergency Medicine

## 2011-10-30 ENCOUNTER — Other Ambulatory Visit: Payer: Self-pay

## 2011-10-30 DIAGNOSIS — R110 Nausea: Secondary | ICD-10-CM | POA: Insufficient documentation

## 2011-10-30 DIAGNOSIS — IMO0001 Reserved for inherently not codable concepts without codable children: Secondary | ICD-10-CM | POA: Insufficient documentation

## 2011-10-30 DIAGNOSIS — M546 Pain in thoracic spine: Secondary | ICD-10-CM | POA: Insufficient documentation

## 2011-10-30 DIAGNOSIS — R630 Anorexia: Secondary | ICD-10-CM | POA: Insufficient documentation

## 2011-10-30 DIAGNOSIS — G8929 Other chronic pain: Secondary | ICD-10-CM | POA: Insufficient documentation

## 2011-10-30 LAB — LIPASE, BLOOD: Lipase: 13 U/L (ref 11–59)

## 2011-10-30 LAB — CBC
Hemoglobin: 10.5 g/dL — ABNORMAL LOW (ref 12.0–15.0)
MCH: 30 pg (ref 26.0–34.0)
MCHC: 34.3 g/dL (ref 30.0–36.0)
MCV: 87.4 fL (ref 78.0–100.0)
RBC: 3.5 MIL/uL — ABNORMAL LOW (ref 3.87–5.11)

## 2011-10-30 LAB — DIFFERENTIAL
Basophils Absolute: 0 10*3/uL (ref 0.0–0.1)
Basophils Relative: 0 % (ref 0–1)
Eosinophils Absolute: 0 10*3/uL (ref 0.0–0.7)
Eosinophils Relative: 0 % (ref 0–5)
Lymphocytes Relative: 6 % — ABNORMAL LOW (ref 12–46)
Lymphs Abs: 0.8 10*3/uL (ref 0.7–4.0)
Monocytes Absolute: 0.9 10*3/uL (ref 0.1–1.0)
Monocytes Relative: 7 % (ref 3–12)
Neutro Abs: 10.5 10*3/uL — ABNORMAL HIGH (ref 1.7–7.7)
Neutrophils Relative %: 86 % — ABNORMAL HIGH (ref 43–77)

## 2011-10-30 LAB — COMPREHENSIVE METABOLIC PANEL
Albumin: 3.2 g/dL — ABNORMAL LOW (ref 3.5–5.2)
Alkaline Phosphatase: 85 U/L (ref 39–117)
BUN: 12 mg/dL (ref 6–23)
Calcium: 10 mg/dL (ref 8.4–10.5)
Creatinine, Ser: 0.83 mg/dL (ref 0.50–1.10)
GFR calc Af Amer: 83 mL/min — ABNORMAL LOW (ref >90)
Glucose, Bld: 240 mg/dL — ABNORMAL HIGH (ref 70–99)
Total Protein: 6.2 g/dL (ref 6.0–8.3)

## 2011-10-30 MED ORDER — HYDROMORPHONE HCL PF 1 MG/ML IJ SOLN
1.0000 mg | Freq: Once | INTRAMUSCULAR | Status: AC
  Administered 2011-10-30: 1 mg via INTRAVENOUS
  Filled 2011-10-30: qty 1

## 2011-10-30 MED ORDER — HYDROMORPHONE HCL PF 1 MG/ML IJ SOLN: 1.0000 mg | Freq: Once | INTRAMUSCULAR | Status: DC

## 2011-10-30 MED ORDER — PROMETHAZINE HCL 25 MG/ML IJ SOLN
12.5000 mg | Freq: Once | INTRAMUSCULAR | Status: DC
Start: 2011-10-30 — End: 2011-10-30
  Filled 2011-10-30: qty 1

## 2011-10-30 MED ORDER — ONDANSETRON HCL 4 MG/2ML IJ SOLN: 4.0000 mg | Freq: Once | INTRAMUSCULAR | Status: DC

## 2011-10-30 MED ORDER — SODIUM CHLORIDE 0.9 % IV BOLUS (SEPSIS): 500.0000 mL | Freq: Once | INTRAVENOUS | Status: DC

## 2011-10-30 MED ORDER — OXYCODONE-ACETAMINOPHEN 5-325 MG PO TABS: 1.0000 | ORAL_TABLET | ORAL | Status: AC | PRN

## 2011-10-30 MED ORDER — ONDANSETRON HCL 4 MG/2ML IJ SOLN
4.0000 mg | Freq: Once | INTRAMUSCULAR | Status: AC
  Administered 2011-10-30: 4 mg via INTRAVENOUS
  Filled 2011-10-30: qty 2

## 2011-10-30 MED ORDER — HYDROMORPHONE HCL PF 1 MG/ML IJ SOLN
2.0000 mg | Freq: Once | INTRAMUSCULAR | Status: AC
  Administered 2011-10-30: 2 mg via INTRAVENOUS
  Filled 2011-10-30: qty 2

## 2011-10-30 MED ORDER — ONDANSETRON HCL 4 MG/2ML IJ SOLN
INTRAMUSCULAR | Status: AC
  Administered 2011-10-30: 4 mg
  Filled 2011-10-30: qty 2

## 2011-10-30 MED ORDER — LORAZEPAM 2 MG/ML IJ SOLN
1.0000 mg | Freq: Once | INTRAMUSCULAR | Status: AC
  Administered 2011-10-30: 1 mg via INTRAVENOUS
  Filled 2011-10-30: qty 1

## 2011-10-30 MED ORDER — SODIUM CHLORIDE 0.9 % IV BOLUS (SEPSIS)
500.0000 mL | Freq: Once | INTRAVENOUS | Status: AC
  Administered 2011-10-30: 1000 mL via INTRAVENOUS

## 2011-10-30 NOTE — ED Provider Notes (Signed)
Medical screening examination/treatment/procedure(s) were conducted as a shared visit with non-physician practitioner(s) and myself.  I personally evaluated the patient during the encounter Has fibromyalgia. C/o uncontrolled pain. No systemic sxs.    Nicholes Stairs, MD 10/30/11 [phone]

## 2011-10-30 NOTE — ED Provider Notes (Signed)
History     CSN: [id number]  Arrival date & time 10/30/11  1004   First MD Initiated Contact with Patient 10/30/11 1029      Chief Complaint  Patient presents with  . Nausea  . Back Pain    (Consider location/radiation/quality/duration/timing/severity/associated sxs/prior treatment) Patient is a 66 y.o. female presenting with back pain. The history is provided by the patient.  Back Pain  This is a chronic problem. The current episode started more than 2 days ago. The problem occurs constantly. Associated with: she reports having a severe flare of her fibromyalgia symptoms since recovering from the flu last week.   Pain location: Bilateral upper back and lower abdomen. The quality of the pain is described as burning and aching. The pain does not radiate. The pain is at a severity of 10/10. The pain is severe. The pain is the same all the time. Associated symptoms include abdominal pain. Pertinent negatives include no chest pain, no fever, no numbness, no headaches, no abdominal swelling, no dysuria, no leg pain and no weakness. Treatments tried: Uses a duragesic patch,  also uses dilaudid for breakthrough pain. The treatment provided no relief.    Past Medical History  Diagnosis Date  . Fibromyalgia   . Diabetes mellitus   . Hypercholesterolemia   . Hypertension     pulmonary  pap 110/31 (mean 62)by RHC 9/10 negative PE by chest CT 2010  . Pulmonary edema     Failed Revatio   . ARF (acute renal failure)     poor candidate for ACE -I or ARB  . Restrictive lung disease     obesity hypoventilation syndrome  . Renal insufficiency   . CAD (coronary artery disease)     multivessel DES x 2 RCA 2007  . Chronic diastolic heart failure   . MRSA (methicillin resistant staphylococcus aureus) pneumonia     Past Surgical History  Procedure Date  . Abdominal hysterectomy   . Coronary artery bypass graft 1997    LIMA to LAD,SVG to OM    Family History  Problem Relation Age of Onset  .  Cancer Mother     oral  . Heart attack Father   . COPD Brother   . Heart disease Brother     History  Substance Use Topics  . Smoking status: Never Smoker   . Smokeless tobacco: Not on file  . Alcohol Use: No    OB History    Grav Para Term Preterm Abortions TAB SAB Ect Mult Living                  Review of Systems  Constitutional: Negative for fever.  HENT: Negative for congestion, sore throat and neck pain.   Eyes: Negative.   Respiratory: Negative for chest tightness and shortness of breath.   Cardiovascular: Negative for chest pain.  Gastrointestinal: Positive for nausea and abdominal pain. Negative for vomiting, diarrhea and blood in stool.  Genitourinary: Negative.  Negative for dysuria.  Musculoskeletal: Positive for back pain. Negative for joint swelling and arthralgias.  Skin: Negative.  Negative for rash and wound.  Neurological: Negative for dizziness, weakness, light-headedness, numbness and headaches.  Hematological: Negative.   Psychiatric/Behavioral: Negative.     Allergies  Aspirin; Codeine; Niacin; and Sulfonamide derivatives  Home Medications   Current Outpatient Rx  Name Route Sig Dispense Refill  . ACETAMINOPHEN 500 MG PO TABS Oral Take 500 mg by mouth every 6 (six) hours as needed. For pain     .  ASPIRIN 81 MG PO TBEC Oral Take 81 mg by mouth daily.      Marland Kitchen CETIRIZINE HCL 10 MG PO TABS Oral Take 10 mg by mouth daily.      Marland Kitchen CLOPIDOGREL BISULFATE 75 MG PO TABS Oral Take 75 mg by mouth daily.      . DULOXETINE HCL 60 MG PO CPEP Oral Take 60 mg by mouth daily.      Marland Kitchen EZETIMIBE-SIMVASTATIN 10-40 MG PO TABS Oral Take 1 tablet by mouth at bedtime. 30 tablet 6  . FENTANYL 12 MCG/HR TD PT72 Transdermal Place 1 patch onto the skin every 3 (three) days.      . FUROSEMIDE 40 MG PO TABS Oral Take 40 mg by mouth daily.     Marland Kitchen HYDROMORPHONE HCL 4 MG PO TABS Oral Take 4 mg by mouth every 4 (four) hours as needed. For pain    . INSULIN GLARGINE 100 UNIT/ML   SOLN Subcutaneous Inject 48 Units into the skin daily.     . ISOSORBIDE MONONITRATE 30 MG PO TB24 Oral Take 1 tablet (30 mg total) by mouth every morning. 30 tablet 6  . LEVOTHYROXINE SODIUM 25 MCG PO TABS Oral Take 25 mcg by mouth daily.      Marland Kitchen METOPROLOL SUCCINATE ER 100 MG PO TB24 Oral Take 100 mg by mouth daily.      Marland Kitchen OMEPRAZOLE 20 MG PO CPDR Oral Take 20 mg by mouth 2 (two) times daily.      Marland Kitchen ONDANSETRON HCL 8 MG PO TABS Oral Take 8 mg by mouth every 8 (eight) hours as needed. For nausea     . OSELTAMIVIR PHOSPHATE 75 MG PO CAPS Oral Take 75 mg by mouth daily.      Marland Kitchen OVER THE COUNTER MEDICATION Oral Take 1 tablet by mouth daily. Iron. Pt unable to tell me what strength     . ALPRAZOLAM 0.5 MG PO TABS Oral Take 0.5 mg by mouth at bedtime as needed. For sleep    . FENTANYL 25 MCG/HR TD PT72 Transdermal Place 1 patch onto the skin every 3 (three) days.     . OXYCODONE-ACETAMINOPHEN 5-325 MG PO TABS Oral Take 1 tablet by mouth every 4 (four) hours as needed for pain. 20 tablet 0  . OXYGEN PERMEABLE LENS PRODUCTS SOLN Does not apply by Does not apply route as needed. 3 liters      BP 150/68  Pulse 104  Resp 20  Ht 5' (1.524 m)  Wt 141 lb (63.957 kg)  BMI 27.54 kg/m2  SpO2 100%  Physical Exam  Nursing note and vitals reviewed. Constitutional: She is oriented to person, place, and time. She appears well-developed and well-nourished. She appears distressed.       She is anxious and appears uncomfortable.   HENT:  Head: Normocephalic and atraumatic.  Eyes: Conjunctivae are normal.  Neck: Normal range of motion.  Cardiovascular: Normal rate, regular rhythm, normal heart sounds and intact distal pulses.   Pulmonary/Chest: Effort normal and breath sounds normal. She has no wheezes. She has no rales.  Abdominal: Soft. Bowel sounds are normal. There is no tenderness. There is no rebound and no guarding.  Musculoskeletal: Normal range of motion.  Neurological: She is alert and oriented to  person, place, and time.  Skin: Skin is warm and dry.  Psychiatric: She has a normal mood and affect.    ED Course  Procedures (including critical care time)  Labs Reviewed  CBC - Abnormal; Notable for  the following:    WBC 12.1 (*)    RBC 3.50 (*)    Hemoglobin 10.5 (*)    HCT 30.6 (*)    All other components within normal limits  DIFFERENTIAL - Abnormal; Notable for the following:    Neutrophils Relative 86 (*)    Neutro Abs 10.5 (*)    Lymphocytes Relative 6 (*)    All other components within normal limits  COMPREHENSIVE METABOLIC PANEL - Abnormal; Notable for the following:    Chloride 93 (*)    Glucose, Bld 240 (*)    Albumin 3.2 (*)    GFR calc non Af Amer 72 (*)    GFR calc Af Amer 83 (*)    All other components within normal limits  LIPASE, BLOOD   Dg Chest Port 1 View  10/30/2011  *RADIOLOGY REPORT*  Clinical Data: Shortness of breath, weakness.  PORTABLE CHEST - 1 VIEW  Comparison: 01/22/2011  Findings: Right PICC line is in place with the tip at the cavoatrial junction.  Prior CABG.  Heart is mildly enlarged. Bilateral lower lobe airspace opacities, left greater than right, similar to prior study.  No effusions.  No acute bony abnormality.  IMPRESSION: A PICC line tip at the cavoatrial junction.  Mild cardiomegaly.  Chronic bibasilar opacities, scarring versus chronic atelectasis.  Original Report Authenticated By: Cyndie Chime, M.D.     No diagnosis found.  Patient given IV fluids and dilaudid 1 mg with no relief.  Repeated this x 2 mg,  Also given zofran that relieved her nausea.  No relief of pain.  Ativan 1 mg.  At re-exam,  Pt seems more relaxed, smiling,  Making small talk,  Yet still reports pain unchanged.     MDM  Will refer to pain management specialist Dr. Laurian Brim in [place].  Pt seen by Dr Weldon Inches prior to dc home.  Date: 10/30/2011  Rate: 98  Rhythm: normal sinus rhythm  QRS Axis: normal  Intervals: normal  ST/T Wave abnormalities: nonspecific ST  changes  Conduction Disutrbances:none  Narrative Interpretation:   Old EKG Reviewed: T wave inversions in anterior leads resolved.         Candis Musa, PA 11/01/11 2231

## 2011-10-30 NOTE — ED Notes (Addendum)
Pt reports severe pain in her upper back from her fibromyalgia.  Pt reports having the "flu" last week and has been taking tamiflu.  Pt reports nausea, decreased appetite, and severe pain.  Pt reports wearing fentanyl patch now w/out relief.

## 2011-11-06 NOTE — ED Provider Notes (Signed)
Medical screening examination/treatment/procedure(s) were performed by non-physician practitioner and as supervising physician I was immediately available for consultation/collaboration.  Carletta Feasel P Jessalynn Mccowan, MD 11/06/11 0706 

## 2011-12-11 ENCOUNTER — Other Ambulatory Visit (HOSPITAL_COMMUNITY): Payer: Self-pay | Admitting: Internal Medicine

## 2011-12-20 ENCOUNTER — Other Ambulatory Visit (HOSPITAL_COMMUNITY): Payer: Self-pay | Admitting: Internal Medicine

## 2011-12-20 DIAGNOSIS — M549 Dorsalgia, unspecified: Secondary | ICD-10-CM

## 2011-12-26 ENCOUNTER — Ambulatory Visit (HOSPITAL_COMMUNITY): Payer: Medicare Other

## 2011-12-26 ENCOUNTER — Inpatient Hospital Stay (HOSPITAL_COMMUNITY): Admission: RE | Admit: 2011-12-26 | Payer: Medicare Other | Source: Ambulatory Visit

## 2011-12-28 ENCOUNTER — Telehealth (HOSPITAL_COMMUNITY): Payer: Self-pay | Admitting: *Deleted

## 2011-12-28 NOTE — Telephone Encounter (Signed)
Mr Curet called today regarding his wife.  She has gained 10 lbs over the past few weeks and she is a little sob. She is also experiencing neck pain, that comes and goes with exertion.  She also has had no energy.  He would like a call back with some advice.

## 2011-12-28 NOTE — Telephone Encounter (Signed)
Had follow up at [facility] and she is now on Flolan. Difficulty breathing on exertion that is much worse. Dr Monia Pouch recommended additional follow up with Dr Gala Romney. She is now taking Lasix 80 mg twice a day with minimal urine output.   Will schedule appointment for Monday.

## 2011-12-31 ENCOUNTER — Ambulatory Visit (HOSPITAL_COMMUNITY)
Admission: RE | Admit: 2011-12-31 | Discharge: 2011-12-31 | Disposition: A | Discharge status: Home / Self Care | Payer: Medicare Other | Source: Ambulatory Visit | Attending: Internal Medicine | Admitting: Internal Medicine

## 2011-12-31 ENCOUNTER — Encounter (HOSPITAL_COMMUNITY): Payer: Self-pay

## 2011-12-31 ENCOUNTER — Encounter (HOSPITAL_COMMUNITY): Payer: Self-pay | Admitting: *Deleted

## 2011-12-31 VITALS — BP 146/50 | HR 100 | Wt 153.8 lb

## 2011-12-31 DIAGNOSIS — G255 Other chorea: Secondary | ICD-10-CM

## 2011-12-31 DIAGNOSIS — Z794 Long term (current) use of insulin: Secondary | ICD-10-CM | POA: Insufficient documentation

## 2011-12-31 DIAGNOSIS — Z79899 Other long term (current) drug therapy: Secondary | ICD-10-CM | POA: Insufficient documentation

## 2011-12-31 DIAGNOSIS — I2510 Atherosclerotic heart disease of native coronary artery without angina pectoris: Secondary | ICD-10-CM | POA: Insufficient documentation

## 2011-12-31 DIAGNOSIS — Z8614 Personal history of Methicillin resistant Staphylococcus aureus infection: Secondary | ICD-10-CM | POA: Insufficient documentation

## 2011-12-31 DIAGNOSIS — E119 Type 2 diabetes mellitus without complications: Secondary | ICD-10-CM | POA: Insufficient documentation

## 2011-12-31 DIAGNOSIS — N179 Acute kidney failure, unspecified: Secondary | ICD-10-CM | POA: Insufficient documentation

## 2011-12-31 DIAGNOSIS — E7800 Pure hypercholesterolemia, unspecified: Secondary | ICD-10-CM | POA: Insufficient documentation

## 2011-12-31 DIAGNOSIS — I2789 Other specified pulmonary heart diseases: Secondary | ICD-10-CM | POA: Insufficient documentation

## 2011-12-31 LAB — BASIC METABOLIC PANEL
Calcium: 9.4 mg/dL (ref 8.4–10.5)
GFR calc Af Amer: 52 mL/min — ABNORMAL LOW (ref >90)
GFR calc non Af Amer: 45 mL/min — ABNORMAL LOW (ref >90)
Potassium: 5 mEq/L (ref 3.5–5.1)
Sodium: 141 mEq/L (ref 135–145)

## 2011-12-31 LAB — CBC
Hemoglobin: 8.3 g/dL — ABNORMAL LOW (ref 12.0–15.0)
MCH: 30 pg (ref 26.0–34.0)
Platelets: 281 10*3/uL (ref 150–400)
RBC: 2.77 MIL/uL — ABNORMAL LOW (ref 3.87–5.11)
WBC: 13.9 10*3/uL — ABNORMAL HIGH (ref 4.0–10.5)

## 2011-12-31 LAB — HEPATIC FUNCTION PANEL
ALT: 11 U/L (ref 0–35)
AST: 17 U/L (ref 0–37)
Albumin: 3.2 g/dL — ABNORMAL LOW (ref 3.5–5.2)
Alkaline Phosphatase: 164 U/L — ABNORMAL HIGH (ref 39–117)
Indirect Bilirubin: 0.2 mg/dL — ABNORMAL LOW (ref 0.3–0.9)
Total Protein: 6.3 g/dL (ref 6.0–8.3)

## 2011-12-31 MED ORDER — OXYGEN PERMEABLE LENS PRODUCTS SOLN: Status: DC

## 2011-12-31 NOTE — Assessment & Plan Note (Signed)
As above. Will pursue cath to re-valuate coronaries given chest and neck pain.

## 2011-12-31 NOTE — Assessment & Plan Note (Signed)
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 I discussed with Dr. Monia Pouch by phone and we both suspect it may be due to Lyrica (she has had similar symptoms in past). Will stop Lyrica. She also has asterixis on exam and I worry about hepatic encephalopathy. Will check CMET and ammonia.

## 2011-12-31 NOTE — Progress Notes (Signed)
HPI: 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   PFTs 10/10: Restrictive lung disease FEV1 1.01L (56%) FVC 1.2 (48%) DLCO 24% Rheum work-up which was negative. Sleep study 11/10: negative for OSA  Cath 10/12: RA 24 RV 132/14/27 PA 131/30 (65) PCW 21 Fick 3.6/2.2 -> transferred to [facility] and started on Flolan.  Saw Dr. Monia Pouch at [facility] last week. Apparently TTE with improved pressures and RV function since 6/12 but much worse. Flolan decreased due to jaw claudication and leg pain. There was also concern that her neck and jaw pain may be recurrent angina and Dr. Monia Pouch recommended considering RHC/LHC. Also started Lyrica for possible neuropathic pain.   Returns today for unscheduled visit due to increasing dyspnea, fatigue and weight gain. Says her breathing was really bad last week but now it is better. Husband says she spends almost all day in bed. Weight up about 10 pounds. Has been taking extra lasix without much help. Notes frequent involuntary jerking of arms and legs over past week. No orthopnea or PND. Still with occasional jaw and neck pain.   Recent labs Bun 11 cr 0.9  ROS: All other systems normal except as mentioned in HPI, past medical history and problem list.    Past Medical History  Diagnosis Date  . Fibromyalgia   . Diabetes mellitus   . Hypercholesterolemia   . Hypertension     pulmonary  pap 110/31 (mean 62)by RHC 9/10 negative PE by chest CT 2010  . Pulmonary edema     Failed Revatio   . ARF (acute renal failure)     poor candidate for ACE -I or ARB  . Restrictive lung disease     obesity hypoventilation syndrome  . Renal insufficiency   . CAD (coronary artery disease)     multivessel DES x 2 RCA 2007  . Chronic diastolic heart failure   . MRSA (methicillin resistant staphylococcus aureus) pneumonia      Current Outpatient Prescriptions  Medication Sig Dispense Refill  . acetaminophen (TYLENOL) 500 MG tablet Take 500 mg by mouth every 6 (six) hours as needed. For pain       . ALPRAZolam (XANAX) 0.5 MG tablet Take 0.5 mg by mouth 3 (three) times daily as needed. For sleep      . clopidogrel (PLAVIX) 75 MG tablet Take 75 mg by mouth daily.        . DULoxetine (CYMBALTA) 60 MG capsule Take 60 mg by mouth daily.        Marland Kitchen ezetimibe-simvastatin (VYTORIN) 10-40 MG per tablet Take 1 tablet by mouth at bedtime.  30 tablet  6  . fentaNYL (DURAGESIC - DOSED MCG/HR) 75 MCG/HR Place 1 patch onto the skin every 3 (three) days.      . furosemide (LASIX) 40 MG tablet Take 40 mg by mouth 2 (two) times daily.       Marland Kitchen HYDROmorphone (DILAUDID) 4 MG tablet Take 4 mg by mouth every 4 (four) hours as needed. For pain      . insulin glargine (LANTUS) 100 UNIT/ML injection Inject 30 Units into the skin daily.       . isosorbide mononitrate (IMDUR) 30 MG 24 hr tablet TAKE 1 TABLET EVERY MORNING  30 tablet  3  . levothyroxine (SYNTHROID, LEVOTHROID) 25 MCG tablet Take 25 mcg by mouth daily.        Marland Kitchen  metoprolol (TOPROL-XL) 100 MG 24 hr tablet Take 100 mg by mouth daily.        Marland Kitchen omeprazole (PRILOSEC) 20 MG capsule Take 20 mg by mouth 2 (two) times daily.        . ondansetron (ZOFRAN) 8 MG tablet Take 8 mg by mouth every 8 (eight) hours as needed. For nausea       . Oxygen Permeable Lens Products SOLN by Does not apply route as needed. 3 liters      . potassium chloride SA (K-DUR,KLOR-CON) 20 MEQ tablet Take 20 mEq by mouth 2 (two) times daily.      . pregabalin (LYRICA) 50 MG capsule Take 50 mg by mouth 2 (two) times daily.         Allergies  Allergen Reactions  . Aspirin     REACTION: Intolerance  . Codeine Other (See Comments)    Messes with digestive system  . Niacin Other (See Comments)    Pins sticking into skin  . Sulfonamide Derivatives Other (See Comments)    Messes with digestive system     History   Social History  . Marital Status: Married    Spouse Name: N/A    Number of Children: N/A  . Years of Education: N/A   Occupational History  . Not on file.   Social History Main Topics  . Smoking status: Never Smoker   . Smokeless tobacco: Not on file  . Alcohol Use: No  . Drug Use: No  . Sexually Active:    Other Topics Concern  . Not on file   Social History Narrative  . No narrative on file    Family History  Problem Relation Age of Onset  . Cancer Mother     oral  . Heart attack Father   . COPD Brother   . Heart disease Brother     PHYSICAL EXAM: Filed Vitals:   12/31/11 1218  BP: 146/50  Pulse: 100  Weight: 158 lb 12.8 oz (72.031 kg)  SpO2: 84%    General:  Chronically ill-appearing. Sitting in WC Wearing 6L [place] O2 HEENT: normal Neck: thick . JVD to jaw. Carotids 2+ bilat; no bruits. No lymphadenopathy or thryomegaly appreciated. Cor: PMI nonpalpable Regular rate & rhythm. No rubs, gallops/ 2/6 TR murmur. P2 perhaps mildly accentuated but not severely so. No RV lift. Lungs: decreased BS throughout Abdomen: obese. soft, nontender, nondistended. No hepatosplenomegaly. No bruits or masses. Good bowel sounds. Extremities: no cyanosis, clubbing, rash, edema Neuro: alert & oriented x 3, cranial nerves grossly intact. moves all 4 extremities w/o difficulty. Affect pleasant. Frequent involuntary jerking of arms and legs. +asterixis   ASSESSMENT & PLAN:

## 2011-12-31 NOTE — Patient Instructions (Signed)
Please have blood work today (CBC, BMP, hepatic panel, pt/inr)  The current medical regimen is effective;  continue present plan and medications.  Your physician has requested that you have a cardiac catheterization. Cardiac catheterization is used to diagnose and/or treat various heart conditions. Doctors may recommend this procedure for a number of different reasons. The most common reason is to evaluate chest pain. Chest pain can be a symptom of coronary artery disease (CAD), and cardiac catheterization can show whether plaque is narrowing or blocking your heart's arteries. This procedure is also used to evaluate the valves, as well as measure the blood flow and oxygen levels in different parts of your heart. For further information please visit [email or web address] Please follow instruction sheet, as given.

## 2011-12-31 NOTE — Assessment & Plan Note (Addendum)
Difficult situation. By echo appears to have has a good response to Flolan but functionally getting much worse. May have mild volume overload but not responding to lasix. Agree with Dr. Monia Pouch that RHC/LHC is indicated to further sort out.  Will schedule for Thursday.   Total time spent 35 minutes.

## 2012-01-03 ENCOUNTER — Inpatient Hospital Stay (HOSPITAL_BASED_OUTPATIENT_CLINIC_OR_DEPARTMENT_OTHER)
Admission: RE | Admit: 2012-01-03 | Discharge: 2012-01-03 | Disposition: A | Discharge status: Home / Self Care | Payer: Medicare Other | Source: Ambulatory Visit | Attending: Internal Medicine | Admitting: Internal Medicine

## 2012-01-03 ENCOUNTER — Encounter (HOSPITAL_BASED_OUTPATIENT_CLINIC_OR_DEPARTMENT_OTHER)
Admission: RE | Disposition: A | Discharge status: Home / Self Care | Payer: Self-pay | Source: Ambulatory Visit | Attending: Internal Medicine

## 2012-01-03 DIAGNOSIS — I2789 Other specified pulmonary heart diseases: Secondary | ICD-10-CM | POA: Insufficient documentation

## 2012-01-03 DIAGNOSIS — E119 Type 2 diabetes mellitus without complications: Secondary | ICD-10-CM | POA: Insufficient documentation

## 2012-01-03 DIAGNOSIS — I2510 Atherosclerotic heart disease of native coronary artery without angina pectoris: Secondary | ICD-10-CM | POA: Insufficient documentation

## 2012-01-03 DIAGNOSIS — E669 Obesity, unspecified: Secondary | ICD-10-CM | POA: Insufficient documentation

## 2012-01-03 DIAGNOSIS — R079 Chest pain, unspecified: Secondary | ICD-10-CM | POA: Insufficient documentation

## 2012-01-03 DIAGNOSIS — I25810 Atherosclerosis of coronary artery bypass graft(s) without angina pectoris: Secondary | ICD-10-CM | POA: Insufficient documentation

## 2012-01-03 DIAGNOSIS — IMO0001 Reserved for inherently not codable concepts without codable children: Secondary | ICD-10-CM | POA: Insufficient documentation

## 2012-01-03 DIAGNOSIS — Z9861 Coronary angioplasty status: Secondary | ICD-10-CM | POA: Insufficient documentation

## 2012-01-03 DIAGNOSIS — I5032 Chronic diastolic (congestive) heart failure: Secondary | ICD-10-CM | POA: Insufficient documentation

## 2012-01-03 LAB — POCT I-STAT 3, VENOUS BLOOD GAS (G3P V)
Acid-Base Excess: 8 mmol/L — ABNORMAL HIGH (ref 0.0–2.0)
Bicarbonate: 33.8 mEq/L — ABNORMAL HIGH (ref 20.0–24.0)
Bicarbonate: 34.8 mEq/L — ABNORMAL HIGH (ref 20.0–24.0)
O2 Saturation: 63 %
O2 Saturation: 66 %
TCO2: 36 mmol/L (ref 0–100)
pH, Ven: 7.331 — ABNORMAL HIGH (ref 7.250–7.300)
pO2, Ven: 38 mmHg (ref 30.0–45.0)

## 2012-01-03 LAB — POCT I-STAT 3, ART BLOOD GAS (G3+)
TCO2: 36 mmol/L (ref 0–100)
pCO2 arterial: 57.1 mmHg (ref 35.0–45.0)
pH, Arterial: 7.381 (ref 7.350–7.400)
pO2, Arterial: 73 mmHg — ABNORMAL LOW (ref 80.0–100.0)

## 2012-01-03 LAB — POCT I-STAT GLUCOSE: Operator id: 221371

## 2012-01-03 SURGERY — JV LEFT AND RIGHT HEART CATHETERIZATION WITH CORONARY ANGIOGRAM
Anesthesia: Moderate Sedation

## 2012-01-03 MED ORDER — DIAZEPAM 5 MG PO TABS
5.0000 mg | ORAL_TABLET | ORAL | Status: AC
  Administered 2012-01-03: 5 mg via ORAL

## 2012-01-03 MED ORDER — ONDANSETRON HCL 4 MG/2ML IJ SOLN: 4.0000 mg | Freq: Four times a day (QID) | INTRAMUSCULAR | Status: DC | PRN

## 2012-01-03 MED ORDER — ACETAMINOPHEN 325 MG PO TABS: 650.0000 mg | ORAL_TABLET | ORAL | Status: DC | PRN

## 2012-01-03 MED ORDER — SODIUM CHLORIDE 0.9 % IJ SOLN: 3.0000 mL | INTRAMUSCULAR | Status: DC | PRN

## 2012-01-03 MED ORDER — SODIUM CHLORIDE 0.9 % IV SOLN: INTRAVENOUS | Status: DC

## 2012-01-03 MED ORDER — OXYCODONE-ACETAMINOPHEN 5-325 MG PO TABS: 1.0000 | ORAL_TABLET | ORAL | Status: DC | PRN

## 2012-01-03 MED ORDER — SODIUM CHLORIDE 0.9 % IV SOLN
INTRAVENOUS | Status: DC
  Administered 2012-01-03: 10:00:00 via INTRAVENOUS

## 2012-01-03 MED ORDER — SODIUM CHLORIDE 0.9 % IV SOLN: 250.0000 mL | INTRAVENOUS | Status: DC | PRN

## 2012-01-03 NOTE — Progress Notes (Signed)
Discharge instructions completed.  Urinated in bedpan placed in a chair to make it easier for patient to go to the restroom.  Discharged to home via wheelchair with husband and sister.

## 2012-01-03 NOTE — OR Nursing (Signed)
Tegaderm dressing applied, site level 0, bedrest begins at 1230 

## 2012-01-03 NOTE — Op Note (Signed)
Cardiac Cath Procedure Note  Indication: PAH & CP  Procedures performed:  1) Right heart cathererization 2) Selective coronary angiography 3) Left heart catheterization 4) Left ventriculogram  Description of procedure:     The risks and indication of the procedure were explained. Consent was signed and placed on the chart. An appropriate timeout was taken prior to the procedure. The right groin was prepped and draped in the routine sterile fashion and anesthetized with 1% local lidocaine.   A 5 FR arterial sheath was placed in the right femoral artery using a modified Seldinger technique. Standard catheters including a JL4, #RDC, JR4 and angled pigtail were used. All catheter exchanges were made over a wire. A 7 FR venous sheath was placed in the right femoral vein using a modified Seldinger technique. A standard Swan-Ganz catheter was used for the procedure.   Complications:  None apparent  Findings:  RA =  15 RV =  100/8/20 PA =   95/24 (57) PCW = 16 Fick cardiac output/index = 6.3/3.6 PVR = 6.5 FA sat = 94% PA sat = 61%,66%  Ao Pressure: 116/56 (82) LV Pressure:  128/14/20 There was no signficant gradient across the aortic valve on pullback.  Left main: Heavily calcified. Distal 40-50%  LAD: Diffuse severe disease. 90% mid. Mid to distal vessel fills from LAD. 2 diagonals with moderate disease.  LCX: Small heavily diseased vessel. 30-40% ostial. Long 95% lesion in mid AV groove after take-off of OM-1. Distal vessel small and diffusely diseased. OM-1 mild plaque. OM-2 and OM-3 occluded  RCA: Ostial spasm.long stent in midsection is patent. Distal RCA 40% before PDA. Mild to moderate plaque in PD and PLs.  LV-gram done in the RAO projection: Ejection fraction = 65-70% No wall motion abnormalities  LIMA-LAD: widely patent with 40-50 lesion in LAD after insertion of LIMA  SVG-LCX: totally occluded proximally (new)  Assessment: 1. Severe 3v CAD 2. LIMA patent 3.  Interval occlusion of SVG-LCX  4. Normal LV function 5. Severe PAH with preserved CO and well compensated L sided pressures.  Plan/Discussion:  Native LCX not candidate for PCI. Continue medical therapy for CAD.  PA prssures improved but still with severe PAH. Will d/w [facility] clinic.  Arvilla Meres, MD 12:09 PM

## 2012-01-03 NOTE — H&P (View-Only) (Signed)
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   PFTs 10/10: Restrictive lung disease FEV1 1.01L (56%) FVC 1.2 (48%) DLCO 24% Rheum work-up which was negative. Sleep study 11/10: negative for OSA  Cath 10/12: RA 24 RV 132/14/27 PA 131/30 (65) PCW 21 Fick 3.6/2.2 -> transferred to [facility] and started on Flolan.  Saw Dr. Fortin at [facility] last week. Apparently TTE with improved pressures and RV function since 6/12 but 6MW much worse. Flolan decreased due to jaw claudication and leg pain. There was also concern that her neck and jaw pain may be recurrent angina and Dr. Fortin recommended considering RHC/LHC. Also started Lyrica for possible neuropathic pain.   Returns today for unscheduled visit due to increasing dyspnea, fatigue and weight gain. Says her breathing was really bad last week but now it is better. Husband says she spends almost all day in bed. Weight up about 10 pounds. Has been taking extra lasix without much help. Notes frequent involuntary jerking of arms and legs over past week. No orthopnea or PND. Still with occasional jaw and neck pain.   Recent labs Bun 11 cr 0.9  ROS: All other systems normal except as mentioned in HPI, past medical history and problem list.    Past Medical History  Diagnosis Date  . Fibromyalgia   . Diabetes mellitus   . Hypercholesterolemia   . Hypertension     pulmonary  pap 110/31 (mean 62)by RHC 9/10 negative PE by chest CT 2010  . Pulmonary edema     Failed Revatio   . ARF (acute renal failure)     poor candidate for ACE -I or ARB  . Restrictive lung disease     obesity hypoventilation syndrome  . Renal insufficiency   . CAD (coronary artery disease)     multivessel DES x 2 RCA 2007  . Chronic diastolic heart failure   . MRSA (methicillin resistant staphylococcus aureus) pneumonia      Current Outpatient Prescriptions  Medication Sig Dispense Refill  . acetaminophen (TYLENOL) 500 MG tablet Take 500 mg by mouth every 6 (six) hours as needed. For pain       . ALPRAZolam (XANAX) 0.5 MG tablet Take 0.5 mg by mouth 3 (three) times daily as needed. For sleep      . clopidogrel (PLAVIX) 75 MG tablet Take 75 mg by mouth daily.        . DULoxetine (CYMBALTA) 60 MG capsule Take 60 mg by mouth daily.        . ezetimibe-simvastatin (VYTORIN) 10-40 MG per tablet Take 1 tablet by mouth at bedtime.  30 tablet  6  . fentaNYL (DURAGESIC - DOSED MCG/HR) 75 MCG/HR Place 1 patch onto the skin every 3 (three) days.      . furosemide (LASIX) 40 MG tablet Take 40 mg by mouth 2 (two) times daily.       . HYDROmorphone (DILAUDID) 4 MG tablet Take 4 mg by mouth every 4 (four) hours as needed. For pain      . insulin glargine (LANTUS) 100 UNIT/ML injection Inject 30 Units into the skin daily.       . isosorbide mononitrate (IMDUR) 30 MG 24 hr tablet TAKE 1 TABLET EVERY MORNING  30 tablet  3  . levothyroxine (SYNTHROID, LEVOTHROID) 25 MCG tablet Take 25 mcg by mouth daily.        .   metoprolol (TOPROL-XL) 100 MG 24 hr tablet Take 100 mg by mouth daily.        . omeprazole (PRILOSEC) 20 MG capsule Take 20 mg by mouth 2 (two) times daily.        . ondansetron (ZOFRAN) 8 MG tablet Take 8 mg by mouth every 8 (eight) hours as needed. For nausea       . Oxygen Permeable Lens Products SOLN by Does not apply route as needed. 3 liters      . potassium chloride SA (K-DUR,KLOR-CON) 20 MEQ tablet Take 20 mEq by mouth 2 (two) times daily.      . pregabalin (LYRICA) 50 MG capsule Take 50 mg by mouth 2 (two) times daily.         Allergies  Allergen Reactions  . Aspirin     REACTION: Intolerance  . Codeine Other (See Comments)    Messes with digestive system  . Niacin Other (See Comments)    Pins sticking into skin  . Sulfonamide Derivatives Other (See Comments)    Messes with digestive system     History   Social History  . Marital Status: Married    Spouse Name: N/A    Number of Children: N/A  . Years of Education: N/A   Occupational History  . Not on file.   Social History Main Topics  . Smoking status: Never Smoker   . Smokeless tobacco: Not on file  . Alcohol Use: No  . Drug Use: No  . Sexually Active:    Other Topics Concern  . Not on file   Social History Narrative  . No narrative on file    Family History  Problem Relation Age of Onset  . Cancer Mother     oral  . Heart attack Father   . COPD Brother   . Heart disease Brother     PHYSICAL EXAM: Filed Vitals:   12/31/11 1218  BP: 146/50  Pulse: 100  Weight: 158 lb 12.8 oz (72.031 kg)  SpO2: 84%    General:  Chronically ill-appearing. Sitting in WC Wearing 6L [place] O2 HEENT: normal Neck: thick . JVD to jaw. Carotids 2+ bilat; no bruits. No lymphadenopathy or thryomegaly appreciated. Cor: PMI nonpalpable Regular rate & rhythm. No rubs, gallops/ 2/6 TR murmur. P2 perhaps mildly accentuated but not severely so. No RV lift. Lungs: decreased BS throughout Abdomen: obese. soft, nontender, nondistended. No hepatosplenomegaly. No bruits or masses. Good bowel sounds. Extremities: no cyanosis, clubbing, rash, edema Neuro: alert & oriented x 3, cranial nerves grossly intact. moves all 4 extremities w/o difficulty. Affect pleasant. Frequent involuntary jerking of arms and legs. +asterixis   ASSESSMENT & PLAN:  

## 2012-01-03 NOTE — Interval H&P Note (Signed)
History and Physical Interval Note:  01/03/2012 11:36 AM  Meagan [name]  has presented today for surgery, with the diagnosis of CAD, CHF , PAH.  The various methods of treatment have been discussed with the patient and family. After consideration of risks, benefits and other options for treatment, the patient has consented to  Procedure(s) (LRB): JV LEFT AND RIGHT HEART CATHETERIZATION WITH CORONARY ANGIOGRAM (N/A) as a surgical intervention .  The patients' history has been reviewed, patient examined, no change in status, stable for surgery.  I have reviewed the patients' chart and labs.  Questions were answered to the patient's satisfaction.     Eddie Payette

## 2012-02-04 ENCOUNTER — Other Ambulatory Visit (HOSPITAL_COMMUNITY): Payer: Self-pay | Admitting: Physician Assistant

## 2012-02-04 ENCOUNTER — Ambulatory Visit (HOSPITAL_COMMUNITY)
Admission: RE | Admit: 2012-02-04 | Discharge: 2012-02-04 | Disposition: A | Discharge status: Home / Self Care | Payer: Medicare Other | Source: Ambulatory Visit | Attending: Physician Assistant | Admitting: Physician Assistant

## 2012-02-04 DIAGNOSIS — M25579 Pain in unspecified ankle and joints of unspecified foot: Secondary | ICD-10-CM

## 2012-02-04 DIAGNOSIS — M25476 Effusion, unspecified foot: Secondary | ICD-10-CM | POA: Insufficient documentation

## 2012-02-04 DIAGNOSIS — M25473 Effusion, unspecified ankle: Secondary | ICD-10-CM | POA: Insufficient documentation

## 2012-05-02 ENCOUNTER — Encounter (HOSPITAL_COMMUNITY): Payer: Self-pay | Admitting: *Deleted

## 2012-05-02 ENCOUNTER — Emergency Department (HOSPITAL_COMMUNITY)
Admission: EM | Admit: 2012-05-02 | Discharge: 2012-05-03 | Disposition: A | Discharge status: Home / Self Care | Payer: Medicare Other | Attending: Emergency Medicine | Admitting: Emergency Medicine

## 2012-05-02 DIAGNOSIS — I10 Essential (primary) hypertension: Secondary | ICD-10-CM | POA: Insufficient documentation

## 2012-05-02 DIAGNOSIS — E785 Hyperlipidemia, unspecified: Secondary | ICD-10-CM | POA: Insufficient documentation

## 2012-05-02 DIAGNOSIS — E119 Type 2 diabetes mellitus without complications: Secondary | ICD-10-CM | POA: Insufficient documentation

## 2012-05-02 DIAGNOSIS — Z79899 Other long term (current) drug therapy: Secondary | ICD-10-CM | POA: Insufficient documentation

## 2012-05-02 DIAGNOSIS — L089 Local infection of the skin and subcutaneous tissue, unspecified: Secondary | ICD-10-CM | POA: Insufficient documentation

## 2012-05-02 DIAGNOSIS — Z95818 Presence of other cardiac implants and grafts: Secondary | ICD-10-CM | POA: Insufficient documentation

## 2012-05-02 DIAGNOSIS — I2510 Atherosclerotic heart disease of native coronary artery without angina pectoris: Secondary | ICD-10-CM | POA: Insufficient documentation

## 2012-05-02 DIAGNOSIS — Z951 Presence of aortocoronary bypass graft: Secondary | ICD-10-CM | POA: Insufficient documentation

## 2012-05-02 DIAGNOSIS — I5032 Chronic diastolic (congestive) heart failure: Secondary | ICD-10-CM | POA: Insufficient documentation

## 2012-05-02 DIAGNOSIS — I509 Heart failure, unspecified: Secondary | ICD-10-CM | POA: Insufficient documentation

## 2012-05-02 LAB — CBC WITH DIFFERENTIAL/PLATELET
Eosinophils Absolute: 0.3 10*3/uL (ref 0.0–0.7)
Eosinophils Relative: 3 % (ref 0–5)
HCT: 30.8 % — ABNORMAL LOW (ref 36.0–46.0)
Hemoglobin: 9.8 g/dL — ABNORMAL LOW (ref 12.0–15.0)
Lymphs Abs: 1 10*3/uL (ref 0.7–4.0)
MCH: 26.5 pg (ref 26.0–34.0)
MCV: 83.2 fL (ref 78.0–100.0)
Monocytes Absolute: 0.5 10*3/uL (ref 0.1–1.0)
Monocytes Relative: 5 % (ref 3–12)
RBC: 3.7 MIL/uL — ABNORMAL LOW (ref 3.87–5.11)

## 2012-05-02 LAB — BASIC METABOLIC PANEL
BUN: 18 mg/dL (ref 6–23)
CO2: 36 mEq/L — ABNORMAL HIGH (ref 19–32)
Calcium: 9.7 mg/dL (ref 8.4–10.5)
GFR calc non Af Amer: 49 mL/min — ABNORMAL LOW (ref >90)
Glucose, Bld: 99 mg/dL (ref 70–99)

## 2012-05-02 NOTE — ED Notes (Addendum)
Pt has a PICC line in right arm, that she is concerned is infected. Per family, home health nurse instructed pt to come here and be evaluated. Area surrounding area is reddened and family reports oozing.  Reports symptoms began on Wednesday.

## 2012-05-03 MED ORDER — CEPHALEXIN 500 MG PO CAPS
500.0000 mg | ORAL_CAPSULE | Freq: Once | ORAL | Status: AC
  Administered 2012-05-03: 500 mg via ORAL
  Filled 2012-05-03: qty 1

## 2012-05-03 MED ORDER — CEPHALEXIN 500 MG PO CAPS: 500.0000 mg | ORAL_CAPSULE | Freq: Four times a day (QID) | ORAL | Status: AC

## 2012-05-03 NOTE — ED Provider Notes (Signed)
Patient has had a PICC line in her right arm since October. She relates it has been working fine however she noted yesterday it was getting some redness at the insertion site.  Patient's noted to have about a centimeter of redness at the insertion site with mild swelling.  Medical screening examination/treatment/procedure(s) were conducted as a shared visit with non-physician practitioner(s) and myself.  I personally evaluated the patient during the encounter  Devoria Albe, MD, Franz Dell, MD 05/03/12 [phone]

## 2012-05-03 NOTE — ED Notes (Signed)
Pt alert & oriented x4. Patient given discharge instructions, paperwork & prescription(s). Patient instructed to stop at the registration desk to finish any additional paperwork. Patient verbalized understanding. Pt left department w/ no further questions. 

## 2012-05-03 NOTE — ED Provider Notes (Signed)
History     CSN: [id number]  Arrival date & time 05/02/12  1903   First MD Initiated Contact with Patient 05/02/12 2222      No chief complaint on file.   (Consider location/radiation/quality/duration/timing/severity/associated sxs/prior treatment) HPI Comments: Meagan [name] presents with a 1 day history of redness around the site of her PICC line located in her right upper arm. She has had the line in place since 10/12 and is used for a continuous Flolan infusion,  Being followed by Dr Monia Pouch at [facility] for pulmonary hypertension.  She was seen by her home health nurse today who changed the dressing and advised evaluation here.   Husband states a tiny amount of yellow fluid was expressed from around the line insertion site when pressed upon.  She denies having any symptoms suggestive of infection,  Including no fevers,  Chills,  Weakness,  Fatigue, nausea or emesis.  She does have arthralgias and myalgias, but states this is her chronic fibromyalgia and not new or worsened today.    The history is provided by the patient, the spouse and a relative.    Past Medical History  Diagnosis Date  . Fibromyalgia   . Diabetes mellitus   . Hypercholesterolemia   . Hypertension     pulmonary  pap 110/31 (mean 62)by RHC 9/10 negative PE by chest CT 2010  . Pulmonary edema     Failed Revatio   . ARF (acute renal failure)     poor candidate for ACE -I or ARB  . Restrictive lung disease     obesity hypoventilation syndrome  . Renal insufficiency   . CAD (coronary artery disease)     multivessel DES x 2 RCA 2007  . Chronic diastolic heart failure   . MRSA (methicillin resistant staphylococcus aureus) pneumonia     Past Surgical History  Procedure Date  . Abdominal hysterectomy   . Coronary artery bypass graft 1997    LIMA to LAD,SVG to OM    Family History  Problem Relation Age of Onset  . Cancer Mother     oral  . Heart attack Father   . COPD Brother   . Heart disease Brother      History  Substance Use Topics  . Smoking status: Never Smoker   . Smokeless tobacco: Not on file  . Alcohol Use: No    OB History    Grav Para Term Preterm Abortions TAB SAB Ect Mult Living                  Review of Systems  Constitutional: Negative for fever.  HENT: Negative for congestion, sore throat and neck pain.   Eyes: Negative.   Respiratory: Negative for chest tightness and shortness of breath.   Cardiovascular: Negative for chest pain.  Gastrointestinal: Negative for nausea and abdominal pain.  Genitourinary: Negative.   Musculoskeletal: Negative for joint swelling and arthralgias.  Skin: Positive for color change. Negative for rash and wound.  Neurological: Negative for dizziness, weakness, light-headedness, numbness and headaches.  Hematological: Negative.   Psychiatric/Behavioral: Negative.     Allergies  Aspirin; Codeine; Niacin; Nsaids; and Sulfonamide derivatives  Home Medications   Current Outpatient Rx  Name Route Sig Dispense Refill  . ALPRAZOLAM 0.5 MG PO TABS Oral Take 0.5 mg by mouth at bedtime. For sleep. *Prescribed one tablet three times daily as needed*    . CLOPIDOGREL BISULFATE 75 MG PO TABS Oral Take 75 mg by mouth every morning.     Marland Kitchen  DULOXETINE HCL 60 MG PO CPEP Oral Take 60 mg by mouth every morning.     Marland Kitchen EZETIMIBE-SIMVASTATIN 10-40 MG PO TABS Oral Take 1 tablet by mouth at bedtime. 30 tablet 6  . FUROSEMIDE 40 MG PO TABS Oral Take 80 mg by mouth daily.     Marland Kitchen HYDROMORPHONE HCL 4 MG PO TABS Oral Take 4 mg by mouth every 4 (four) hours as needed. For pain    . INSULIN GLARGINE 100 UNIT/ML [place] SOLN Subcutaneous Inject 18 Units into the skin every morning.     Marland Kitchen LEVOTHYROXINE SODIUM 25 MCG PO TABS Oral Take 25 mcg by mouth every morning.     Marland Kitchen METOPROLOL SUCCINATE ER 100 MG PO TB24 Oral Take 100 mg by mouth every morning.     Marland Kitchen OMEPRAZOLE 20 MG PO CPDR Oral Take 20 mg by mouth 2 (two) times daily.      Marland Kitchen ONDANSETRON HCL 8 MG PO TABS Oral  Take 8 mg by mouth every 8 (eight) hours as needed. For nausea     . OXYGEN PERMEABLE LENS PRODUCTS SOLN  6 liters    . POTASSIUM CHLORIDE CRYS ER 20 MEQ PO TBCR Oral Take 40 mEq by mouth 2 (two) times daily.     . CEPHALEXIN 500 MG PO CAPS Oral Take 1 capsule (500 mg total) by mouth every 6 (six) hours. 40 capsule 0    BP 116/46  Pulse 73  Temp 97.5 F (36.4 C) (Oral)  Resp 22  Ht 5' (1.524 m)  Wt 134 lb (60.782 kg)  BMI 26.17 kg/m2  SpO2 98%  Physical Exam  Nursing note and vitals reviewed. Constitutional: She appears well-developed and well-nourished.  HENT:  Head: Normocephalic and atraumatic.  Eyes: Conjunctivae are normal.  Neck: Normal range of motion.  Cardiovascular: Normal rate, regular rhythm, normal heart sounds and intact distal pulses.   Pulses:      Radial pulses are 2+ on the right side.  Pulmonary/Chest: Effort normal and breath sounds normal. She has no wheezes.  Abdominal: Soft.  Musculoskeletal: Normal range of motion.  Neurological: She is alert.  Skin: Skin is warm and dry. There is erythema.       1 cm erythema surrounding PICC line insertion with slight edema,  No drainage, nontender.    Psychiatric: She has a normal mood and affect.    ED Course  Procedures (including critical care time)  Labs Reviewed  CBC WITH DIFFERENTIAL - Abnormal; Notable for the following:    RBC 3.70 (*)     Hemoglobin 9.8 (*)     HCT 30.8 (*)     Neutrophils Relative 81 (*)     Neutro Abs 7.9 (*)     Lymphocytes Relative 11 (*)     All other components within normal limits  BASIC METABOLIC PANEL - Abnormal; Notable for the following:    Chloride 93 (*)     CO2 36 (*)     Creatinine, Ser 1.14 (*)     GFR calc non Af Amer 49 (*)     GFR calc Af Amer 56 (*)     All other components within normal limits  CULTURE, BLOOD (ROUTINE X 2)  CULTURE, BLOOD (ROUTINE X 2)   No results found.   1. Local skin infection       MDM  Labs reviewed.  Call placed to [facility] -  spoke with Dr. Dustin Flock,  On call for Dr. Monia Pouch - discussed pt sx,  Lab results.  Probable local skin infection as opposed to line infection.  Trial of keflex recommended with close f/u with Dr. Monia Pouch.  Discussed plan with pt and family , 1st dose of keflex given prior to dc home.  Also discussed return here or call to Dr Monia Pouch if sx worsen,  Increased redness,  Fever,  Weakness or other new sx.  Otherwise call Dr Monia Pouch on Monday for close office f/y.        Burgess Amor, [place] 05/03/12 [phone]

## 2012-05-03 NOTE — ED Provider Notes (Signed)
See prior note   Ward Givens, MD 05/03/12 [phone]

## 2012-05-06 ENCOUNTER — Encounter: Payer: Self-pay | Admitting: Internal Medicine

## 2012-05-07 ENCOUNTER — Ambulatory Visit (INDEPENDENT_AMBULATORY_CARE_PROVIDER_SITE_OTHER): Payer: Medicare Other | Admitting: Gastroenterology

## 2012-05-07 ENCOUNTER — Encounter: Payer: Self-pay | Admitting: Gastroenterology

## 2012-05-07 VITALS — BP 95/55 | HR 82 | Temp 97.6°F | Ht 60.0 in | Wt 138.6 lb

## 2012-05-07 DIAGNOSIS — R160 Hepatomegaly, not elsewhere classified: Secondary | ICD-10-CM

## 2012-05-07 DIAGNOSIS — R109 Unspecified abdominal pain: Secondary | ICD-10-CM

## 2012-05-07 DIAGNOSIS — K5900 Constipation, unspecified: Secondary | ICD-10-CM

## 2012-05-07 DIAGNOSIS — K219 Gastro-esophageal reflux disease without esophagitis: Secondary | ICD-10-CM

## 2012-05-07 DIAGNOSIS — R634 Abnormal weight loss: Secondary | ICD-10-CM

## 2012-05-07 DIAGNOSIS — R110 Nausea: Secondary | ICD-10-CM | POA: Insufficient documentation

## 2012-05-07 LAB — CULTURE, BLOOD (ROUTINE X 2): Culture: NO GROWTH

## 2012-05-07 MED ORDER — POLYETHYLENE GLYCOL 3350 17 GM/SCOOP PO POWD: 17.0000 g | Freq: Every day | ORAL | Status: AC

## 2012-05-07 NOTE — Progress Notes (Signed)
Primary Care Physician:  Cassell Smiles., MD  Primary Gastroenterologist:  Roetta Sessions, MD   Chief Complaint  Patient presents with  . Abdominal Pain  . Nausea    can not eat    HPI:  Meagan [name] is a 66 y.o. female here for further evaluation of postprandial abdominal pain, nausea, weight loss. She was last seen in 2011 for early satiety, N/V, esophageal dysphagia. EGD 06/2010 showed Schatzki's ring s/p dilation, small hh. GES 06/2010 was normal with 80% emptying at 2 hours. Abd u/s 05/2010 showed mild diffuse fatty liver. Gallbladder normal. Never had a colonoscopy before. She states she cannot drink prep and doesn't want to waste anyone's time or money.   For past one year, she has had continued nausea and pp abdominal pain. Cannot eat. Lower abdominal pain, significant stabbing pain with meals. Lasts for several hours. Zofran several times daily. Heartburn on omeprazole. Not as good as Nexium. Occasionally feels like food does not go down. BM generally hard. Stool softner most days, but does not work good enough. No melena, brbpr. 1/2 grapfruit, 1/2 piece of toast OR breakfast bar. Very little meat. Diet free beverages.   Current Outpatient Prescriptions  Medication Sig Dispense Refill  . ALPRAZolam (XANAX) 0.5 MG tablet Take 0.5 mg by mouth at bedtime. For sleep. *Prescribed one tablet three times daily as needed*      . cephALEXin (KEFLEX) 500 MG capsule Take 1 capsule (500 mg total) by mouth every 6 (six) hours.  40 capsule  0  . clopidogrel (PLAVIX) 75 MG tablet Take 75 mg by mouth every morning.       . DULoxetine (CYMBALTA) 60 MG capsule Take 60 mg by mouth every morning.       Marland Kitchen Epoprostenol Sodium (FLOLAN IV) Inject into the vein.      Marland Kitchen ezetimibe-simvastatin (VYTORIN) 10-40 MG per tablet Take 1 tablet by mouth at bedtime.  30 tablet  6  . furosemide (LASIX) 40 MG tablet Take 80 mg by mouth daily.       Marland Kitchen HYDROmorphone (DILAUDID) 4 MG tablet Take 4 mg by mouth every 4  (four) hours as needed. For pain      . insulin glargine (LANTUS) 100 UNIT/ML injection Inject 18 Units into the skin every morning.       Marland Kitchen levothyroxine (SYNTHROID, LEVOTHROID) 25 MCG tablet Take 25 mcg by mouth every morning.       . metoprolol (TOPROL-XL) 100 MG 24 hr tablet Take 100 mg by mouth every morning.       Marland Kitchen omeprazole (PRILOSEC) 20 MG capsule Take 20 mg by mouth 2 (two) times daily.        . ondansetron (ZOFRAN) 8 MG tablet Take 8 mg by mouth every 8 (eight) hours as needed. For nausea       . Oxygen Permeable Lens Products SOLN 6 liters      . potassium chloride SA (K-DUR,KLOR-CON) 20 MEQ tablet Take 40 mEq by mouth 2 (two) times daily.       . traMADol (ULTRAM) 50 MG tablet Take 50 mg by mouth every 6 (six) hours as needed.       Marland Kitchen DISCONTD: cetirizine (ZYRTEC) 10 MG tablet Take 10 mg by mouth daily.          Allergies as of 05/07/2012 - Review Complete 05/07/2012  Allergen Reaction Noted  . Aspirin    . Codeine Other (See Comments)   . Niacin Other (See Comments)   .  Nsaids Other (See Comments)   . Sulfonamide derivatives Other (See Comments)     Past Medical History  Diagnosis Date  . Fibromyalgia   . Diabetes mellitus   . Hypercholesterolemia   . Hypertension     pulmonary  pap 110/31 (mean 62)by RHC 9/10 negative PE by chest CT 2010  . Pulmonary edema     Failed Revatio   . ARF (acute renal failure)     poor candidate for ACE -I or ARB  . Restrictive lung disease     obesity hypoventilation syndrome  . Renal insufficiency   . CAD (coronary artery disease)     multivessel DES x 2 RCA 2007  . Chronic diastolic heart failure   . MRSA (methicillin resistant staphylococcus aureus) pneumonia   . Pulmonary hypertension   . Diastolic heart failure   . Chronic anemia     anemia of chronic disease based on anemia panel 2011    Past Surgical History  Procedure Date  . Abdominal hysterectomy   . Coronary artery bypass graft 1997    LIMA to LAD,SVG to OM  .  Esophagogastroduodenoscopy 06/28/10    schatzki ring otherwise normal;small hiatal hernia    Family History  Problem Relation Age of Onset  . Cancer Mother     oral  . Heart attack Father   . COPD Brother   . Heart disease Brother     History   Social History  . Marital Status: Married    Spouse Name: N/A    Number of Children: N/A  . Years of Education: N/A   Occupational History  . Not on file.   Social History Main Topics  . Smoking status: Never Smoker   . Smokeless tobacco: Not on file  . Alcohol Use: No  . Drug Use: No  . Sexually Active:    Other Topics Concern  . Not on file   Social History Narrative  . No narrative on file      ROS:  General: Negative for fever, chills. C/o fatigue, weakness. Weight down over 50 pounds since 08/2009. Weighed 174 in 05/2010.  Eyes: Negative for vision changes.  ENT: Negative for hoarseness,  nasal congestion. CV: Negative for chest pain, angina, palpitations. Positive for dyspnea on exertion. No peripheral edema.  Respiratory: Positive for dyspnea on exertion, cough, wheezing.  GI: See history of present illness. GU:  Negative for dysuria, hematuria, urinary incontinence, urinary frequency, nocturnal urination.  MS: Negative for joint pain, low back pain.  Derm: Negative for rash or itching.  Neuro: Negative for weakness, abnormal sensation, seizure, frequent headaches, memory loss, confusion.  Psych: Negative for anxiety, depression, suicidal ideation, hallucinations.  Endo: see hpi.  Heme: Negative for bruising or bleeding. Allergy: Negative for rash or hives.    Physical Examination:  BP 95/55  Pulse 82  Temp 97.6 F (36.4 C) (Temporal)  Ht 5' (1.524 m)  Wt 138 lb 9.6 oz (62.869 kg)  BMI 27.07 kg/m2   General: chronically ill-appearing WF in no acute distress. Accompanied by spouse. Oxygen in place. Continuous infusion of flolan.  Head: Normocephalic, atraumatic.   Eyes: Conjunctiva pink, no  icterus. Mouth: Oropharyngeal mucosa moist and pink , no lesions erythema or exudate. Neck: Supple without thyromegaly, masses, or lymphadenopathy.  Lungs: Clear to auscultation bilaterally.  Heart: Regular rate and rhythm, no murmurs rubs or gallops.  Abdomen: Bowel sounds are normal, ruq/epig tenderness. Hepatomegaly with liver edge at 9 fingerbreadths in MCL. no splenomegaly, no abdominal  bruits or    hernia , no rebound or guarding.   Rectal: not performed Extremities: No lower extremity edema. No clubbing or deformities.  Neuro: Alert and oriented x 4 , grossly normal neurologically.  Skin: Warm and dry, no rash or jaundice.   Psych: Alert and cooperative, normal mood and affect.  Labs: Lab Results  Component Value Date   ALT 11 12/31/2011   AST 17 12/31/2011   ALKPHOS 164* 12/31/2011   BILITOT 0.4 12/31/2011   Lab Results  Component Value Date   CREATININE 1.14* 05/02/2012   BUN 18 05/02/2012   NA 135 05/02/2012   K 4.6 05/02/2012   CL 93* 05/02/2012   CO2 36* 05/02/2012   Lab Results  Component Value Date   WBC 9.8 05/02/2012   HGB 9.8* 05/02/2012   HCT 30.8* 05/02/2012   MCV 83.2 05/02/2012   PLT 309 05/02/2012    Lab Results  Component Value Date   IRON 41* 12/05/2009   TIBC 306 12/05/2009   FERRITIN 97 12/05/2009   Lab Results  Component Value Date   VITAMINB12 >2000* 12/05/2009        Imaging Studies: No results found.

## 2012-05-07 NOTE — Patient Instructions (Addendum)
We have scheduled you for a CT of your abdomen.  Please start Miralax one capful mixed in 4-6 ounces of liquid at bedtime on days you do not have a good bowel movement. RX sent to your pharmacy.   Please collect stool so we can check for blood in it.  Need to check your LFTs (blood work for your liver). Not checked recently. You may be able to have done the day of your CT scan.

## 2012-05-07 NOTE — Assessment & Plan Note (Addendum)
One year h/o pp abdominal pain, nausea. Weight loss of over 50 pounds as described above. EGD 2011. Abd u/s and GES 2011. Patient reluctant to come back for visit since then due to fear of having to drink bowel prep for colonoscopy that was suggested to her previously. On exam, she has hepatomegaly with ruq tenderness. AP elevated. At this point, advise CT A/P with contrast to further evaluate symptoms and liver. Patient became very frustrated when I advised her she would need to drink oral contrast, stating she couldn't do it. I encouraged her to drink as much of it as possible but no one would FORCE her to drink it all if she felt she couldn't. She agreed to the study.   We will repeat LFTs. Do ifobt. After CT, we will decide next step regarding colonoscopy and/or EGD.

## 2012-05-08 DIAGNOSIS — K5900 Constipation, unspecified: Secondary | ICD-10-CM | POA: Insufficient documentation

## 2012-05-08 NOTE — Assessment & Plan Note (Signed)
Add miralax 17g daily as needed. If she tolerates miralax, we may be able to use as bowel prep for procedure OR Prepopik.

## 2012-05-08 NOTE — Progress Notes (Signed)
Faxed to PCP, Dr Fusco 

## 2012-05-09 ENCOUNTER — Telehealth: Payer: Self-pay | Admitting: Gastroenterology

## 2012-05-09 ENCOUNTER — Ambulatory Visit (HOSPITAL_COMMUNITY)
Admission: RE | Admit: 2012-05-09 | Discharge: 2012-05-09 | Disposition: A | Discharge status: Home / Self Care | Payer: Medicare Other | Source: Ambulatory Visit | Attending: Gastroenterology | Admitting: Gastroenterology

## 2012-05-09 ENCOUNTER — Ambulatory Visit (INDEPENDENT_AMBULATORY_CARE_PROVIDER_SITE_OTHER): Payer: Medicare Other | Admitting: Gastroenterology

## 2012-05-09 DIAGNOSIS — R188 Other ascites: Secondary | ICD-10-CM | POA: Insufficient documentation

## 2012-05-09 DIAGNOSIS — IMO0001 Reserved for inherently not codable concepts without codable children: Secondary | ICD-10-CM | POA: Insufficient documentation

## 2012-05-09 DIAGNOSIS — K5900 Constipation, unspecified: Secondary | ICD-10-CM

## 2012-05-09 DIAGNOSIS — E119 Type 2 diabetes mellitus without complications: Secondary | ICD-10-CM | POA: Insufficient documentation

## 2012-05-09 DIAGNOSIS — J90 Pleural effusion, not elsewhere classified: Secondary | ICD-10-CM | POA: Insufficient documentation

## 2012-05-09 DIAGNOSIS — R160 Hepatomegaly, not elsewhere classified: Secondary | ICD-10-CM

## 2012-05-09 DIAGNOSIS — K869 Disease of pancreas, unspecified: Secondary | ICD-10-CM | POA: Insufficient documentation

## 2012-05-09 DIAGNOSIS — R109 Unspecified abdominal pain: Secondary | ICD-10-CM | POA: Insufficient documentation

## 2012-05-09 DIAGNOSIS — K449 Diaphragmatic hernia without obstruction or gangrene: Secondary | ICD-10-CM | POA: Insufficient documentation

## 2012-05-09 DIAGNOSIS — R634 Abnormal weight loss: Secondary | ICD-10-CM | POA: Insufficient documentation

## 2012-05-09 LAB — IFOBT (OCCULT BLOOD): IFOBT: NEGATIVE

## 2012-05-09 MED ORDER — IOHEXOL 300 MG/ML  SOLN
100.0000 mL | Freq: Once | INTRAMUSCULAR | Status: AC | PRN
  Administered 2012-05-09: 100 mL via INTRAVENOUS

## 2012-05-09 NOTE — Telephone Encounter (Signed)
Called pt- she was in the bathroom at the time, spoke with pts husband- he said they were going today at 1pm to have CT done.

## 2012-05-09 NOTE — Telephone Encounter (Signed)
Please call patient ASAP. Received call from radiology CT dept that patient was very upset when they called her yesterday to remind her to come to hospital at 1:15pm to start drinking her contrast. She states we told her she would not have to drink the contrast. As documented, I told her to drink as much as she could but I wanted her to try. I also told her that CT tech would not "force" her and she agreed the day of visit with this plan. I spoke to [place], [place] tech yesterday and she is aware of the plan.  Please call patient and make sure she goes for her CT today. It is very important to so we can determine what's going on with her and evaluate large liver. Let me know if there is any further problem.

## 2012-05-09 NOTE — Progress Notes (Signed)
Quick Note:  ifobt neg. Await ct results. ______

## 2012-05-12 NOTE — Progress Notes (Signed)
Quick Note:  Discussed CT findings with Dr. Tyron Russell. Numerous findings. Pleural enhancement. Right sided stable from 2012. Pancreatic lesion more cystic looking rather than like typical adenocarcinoma. Peritoneal enhancement in LUQ. Splenic lesions irregular (?infection or malignancy) Stomach wall thickening but collapsed. Colon with stool throughout. Descending colon underdistended. Largest lesion in liver seen on prior CT.  ?need diagnostic paracentesis to look for malignancy or infection. ?other imaging To discuss with Dr. Jena Gauss.  ______

## 2012-05-12 NOTE — Progress Notes (Signed)
Quick Note:  Please put patient on RMR schedule for tomorrow to discuss CT findings. Thanks. ______

## 2012-05-12 NOTE — Progress Notes (Signed)
Quick Note:  Pt is aware. ______ 

## 2012-05-13 ENCOUNTER — Encounter: Payer: Self-pay | Admitting: Internal Medicine

## 2012-05-13 ENCOUNTER — Ambulatory Visit (INDEPENDENT_AMBULATORY_CARE_PROVIDER_SITE_OTHER): Payer: Medicare Other | Admitting: Internal Medicine

## 2012-05-13 VITALS — BP 107/54 | HR 86 | Temp 98.2°F | Ht 60.0 in | Wt 139.0 lb

## 2012-05-13 DIAGNOSIS — R188 Other ascites: Secondary | ICD-10-CM

## 2012-05-13 NOTE — Progress Notes (Signed)
Primary Care Physician:  Cassell Smiles., MD Primary Gastroenterologist:  Dr. Jena Gauss  Pre-Procedure History & Physical: HPI:  Meagan [name] is a 66 y.o. female here for followup of the visit last week. Abdominal pain, early satiety weight loss and CT of abdomen and pelvis done revealed multiple abnormalities including marked ascites hypodense and possibly enhanced lesions of the spleen hypo-dense mass in the head of the pancreas  Past Medical History  Diagnosis Date  . Fibromyalgia   . Diabetes mellitus   . Hypercholesterolemia   . Hypertension     pulmonary  pap 110/31 (mean 62)by [facility] 9/10 negative PE by chest CT 2010  . Pulmonary edema     Failed Revatio   . ARF (acute renal failure)     poor candidate for ACE -I or ARB  . Restrictive lung disease     obesity hypoventilation syndrome  . Renal insufficiency   . CAD (coronary artery disease)     multivessel DES x 2 RCA 2007  . Chronic diastolic heart failure   . MRSA (methicillin resistant staphylococcus aureus) pneumonia   . Pulmonary hypertension   . Diastolic heart failure   . Chronic anemia     anemia of chronic disease based on anemia panel 2011    Past Surgical History  Procedure Date  . Abdominal hysterectomy   . Coronary artery bypass graft 1997    LIMA to LAD,SVG to OM  . Esophagogastroduodenoscopy 06/28/10    schatzki ring otherwise normal;small hiatal hernia    Prior to Admission medications   Medication Sig Start Date End Date Taking? Authorizing Provider  ALPRAZolam Prudy Feeler) 0.5 MG tablet Take 0.5 mg by mouth at bedtime. For sleep. *Prescribed one tablet three times daily as needed*   Yes Historical Provider, MD  cephALEXin (KEFLEX) 500 MG capsule Take 1 capsule (500 mg total) by mouth every 6 (six) hours. 05/03/12 05/13/12 Yes Burgess Amor, PA  clopidogrel (PLAVIX) 75 MG tablet Take 75 mg by mouth every morning.    Yes Historical Provider, MD  DULoxetine (CYMBALTA) 60 MG capsule Take 60 mg by mouth every  morning.    Yes Historical Provider, MD  Epoprostenol Sodium (FLOLAN IV) Inject into the vein.   Yes Historical Provider, MD  ezetimibe-simvastatin (VYTORIN) 10-40 MG per tablet Take 1 tablet by mouth at bedtime. 10/03/11  Yes Dolores Patty, MD  furosemide (LASIX) 40 MG tablet Take 80 mg by mouth daily.    Yes Historical Provider, MD  HYDROmorphone (DILAUDID) 4 MG tablet Take 4 mg by mouth every 4 (four) hours as needed. For pain   Yes Historical Provider, MD  insulin glargine (LANTUS) 100 UNIT/ML injection Inject 18 Units into the skin every morning.    Yes Historical Provider, MD  levothyroxine (SYNTHROID, LEVOTHROID) 25 MCG tablet Take 25 mcg by mouth every morning.    Yes Historical Provider, MD  metoprolol (TOPROL-XL) 100 MG 24 hr tablet Take 100 mg by mouth every morning.    Yes Historical Provider, MD  omeprazole (PRILOSEC) 20 MG capsule Take 20 mg by mouth 2 (two) times daily.     Yes Historical Provider, MD  ondansetron (ZOFRAN) 8 MG tablet Take 8 mg by mouth every 8 (eight) hours as needed. For nausea    Yes Historical Provider, MD  Oxygen Perme[employer] SOLN 6 liters 12/31/11  Yes Dolores Patty, MD  potassium chloride SA (K-DUR,KLOR-CON) 20 MEQ tablet Take 40 mEq by mouth 2 (two) times daily.    Yes  Historical Provider, MD  traMADol (ULTRAM) 50 MG tablet Take 50 mg by mouth every 6 (six) hours as needed.  04/22/12  Yes Historical Provider, MD    Allergies as of 05/13/2012 - Review Complete 05/13/2012  Allergen Reaction Noted  . Aspirin    . Codeine Other (See Comments)   . Niacin Other (See Comments)   . Nsaids Other (See Comments)   . Sulfonamide derivatives Other (See Comments)     Family History  Problem Relation Age of Onset  . Cancer Mother     oral  . Heart attack Father   . COPD Brother   . Heart disease Brother     History   Social History  . Marital Status: Married    Spouse Name: N/A    Number of Children: N/A  . Years of Education: N/A    Occupational History  . Not on file.   Social History Main Topics  . Smoking status: Never Smoker   . Smokeless tobacco: Not on file  . Alcohol Use: No  . Drug Use: No  . Sexually Active:    Other Topics Concern  . Not on file   Social History Narrative  . No narrative on file    Review of Systems: See HPI, otherwise negative ROS  Physical Exam: BP 107/54  Pulse 86  Temp 98.2 F (36.8 C) (Temporal)  Ht 5' (1.524 m)  Wt 139 lb (63.05 kg)  BMI 27.15 kg/m2 General:   Chronically ill lady with oxygen concentrator/nasal prongs in place. Ambulate with a walker. Coming by her husband. Examined in chair. Skin:  Intact without significant lesions or rashes. Eyes:  Sclera clear, no icterus.   Conjunctiva pink. Ears:  Normal auditory acuity. Nose:  No deformity, discharge,  or lesions. Mouth:  No deformity or lesions. Neck:  Supple; no masses or thyromegaly. No significant cervical adenopathy. Lungs:  Clear throughout to auscultation.   No wheezes, crackles, or rhonchi. No acute distress. Heart:  Regular rate and rhythm; no murmurs, clicks, rubs,  or gallops. Abdomen: Full Positive bowel sounds. Liver edge indistinct right costal margin do not obviously appreciate spleen or other mass she does have shifting dullness and fluid 1 Pulses:  Normal pulses noted. Extremities:  Without clubbing or edema.  Impression/Plan: Markedly abnormal CT scan as outlined above in the setting of early satiety, significant weight loss over the past one year. Infectious versus neoplastic etiology. Recommendations: We'll proceed with a diagnostic paracentesis as the next In her evaluation. Will arrange for studies to be performed including sending a large aliquot off for cytology. If fluid analysis negative, endoscopic ultrasound examination of the pancreatic lesion may be the next step versus hematology oncology consultation. Further recommendations to follow.

## 2012-05-13 NOTE — Patient Instructions (Addendum)
Scheduled paracentesis with Dr. Tyron Russell tomorrow.  Need to send at least 1 L of fluid for cytology  Additional fluid for Gram stain, cell count with differential culture  AFB smear and culture

## 2012-05-14 ENCOUNTER — Other Ambulatory Visit: Payer: Self-pay | Admitting: Internal Medicine

## 2012-05-14 ENCOUNTER — Inpatient Hospital Stay (HOSPITAL_COMMUNITY)
Admission: RE | Admit: 2012-05-14 | Discharge: 2012-05-14 | Payer: Medicare Other | Source: Ambulatory Visit | Attending: Internal Medicine | Admitting: Internal Medicine

## 2012-05-14 ENCOUNTER — Inpatient Hospital Stay (HOSPITAL_COMMUNITY): Admission: RE | Admit: 2012-05-14 | Payer: Medicare Other | Source: Ambulatory Visit

## 2012-05-14 ENCOUNTER — Other Ambulatory Visit: Payer: Self-pay | Admitting: Gastroenterology

## 2012-05-14 ENCOUNTER — Ambulatory Visit (HOSPITAL_COMMUNITY)
Admission: RE | Admit: 2012-05-14 | Discharge: 2012-05-14 | Disposition: A | Discharge status: Home / Self Care | Payer: Medicare Other | Source: Ambulatory Visit | Attending: Internal Medicine | Admitting: Internal Medicine

## 2012-05-14 DIAGNOSIS — R188 Other ascites: Secondary | ICD-10-CM

## 2012-05-14 DIAGNOSIS — E119 Type 2 diabetes mellitus without complications: Secondary | ICD-10-CM | POA: Insufficient documentation

## 2012-05-14 DIAGNOSIS — I10 Essential (primary) hypertension: Secondary | ICD-10-CM | POA: Insufficient documentation

## 2012-05-14 LAB — BODY FLUID CELL COUNT WITH DIFFERENTIAL
Lymphs, Fluid: 17 %
Neutrophil Count, Fluid: 5 % (ref 0–25)
Other Cells, Fluid: 2 %
Total Nucleated Cell Count, Fluid: 101 cu mm (ref 0–1000)

## 2012-05-14 NOTE — Progress Notes (Signed)
Lidocaine 1%          9mL injected                              abdominal fluid removed

## 2012-05-14 NOTE — Progress Notes (Signed)
Opened in error

## 2012-05-14 NOTE — Procedures (Signed)
PreOperative Dx: Ascites Postoperative Dx: Ascites Procedure:   US guided paracentesis Radiologist:  Tyron Russell Anesthesia:  7 ml of 1% lidocaine Specimen:  2180 ml of yellow fluid EBL:   None Complications: None

## 2012-05-18 LAB — BODY FLUID CULTURE
Culture: NO GROWTH
Gram Stain: NONE SEEN

## 2012-05-27 ENCOUNTER — Telehealth: Payer: Self-pay | Admitting: Internal Medicine

## 2012-05-27 NOTE — Telephone Encounter (Signed)
Discussed negative studies on ascitic fluid with husband this evening via telephone. The question is the next step In the diagnostic algorithm. I will ask Dr. Christella Hartigan to look at the CT to see if he thinks an EUS would be a reasonable next step versus PET scanning versus referral to a hematologist.

## 2012-06-02 ENCOUNTER — Telehealth: Payer: Self-pay | Admitting: Gastroenterology

## 2012-06-02 ENCOUNTER — Telehealth: Payer: Self-pay

## 2012-06-02 ENCOUNTER — Other Ambulatory Visit: Payer: Self-pay

## 2012-06-02 DIAGNOSIS — R932 Abnormal findings on diagnostic imaging of liver and biliary tract: Secondary | ICD-10-CM

## 2012-06-02 NOTE — Telephone Encounter (Signed)
Referral sent to Chales Abrahams at Dr. Christella Hartigan office to schedule for EUS

## 2012-06-02 NOTE — Telephone Encounter (Signed)
Pt needs to be notified and waiting on anti coag response from Dr Sherwood Gambler.

## 2012-06-02 NOTE — Telephone Encounter (Signed)
Message copied by Donata Duff on Mon Jun 02, 2012  1:46 PM ------      Message from: Rob Bunting P      Created: Mon Jun 02, 2012 12:35 PM       Can you put her on for upper EUS this Wednesday (this will be my third EUS for that afternoon with propfol, one is already scheduled, second i'll hand to you now).  with propofol at [facility] for abnormal pancreas.  60 min, radial +/- linear                  Thanks                  ----- Message -----         From: Donata Duff, CMA         Sent: 06/02/2012  10:37 AM           To: Rachael Fee, MD                        ----- Message -----         From: Glendora Score         Sent: 06/02/2012  10:09 AM           To: Donata Duff, CMA              Message from: Corbin Ade           Created: Wed May 28, 2012  2:10 PM                  Thanks for taking a look at this case. I spoke to patient's husband yesterday afternoon regarding an EUS as being the next step in the patient's evaluation. I also told him I would be speaking to you. So, I think you have the green light to make contact directly with the patient and proceed. I really appreciate your help.           ----- Message -----              From: Rachael Fee, MD              Sent: 05/28/2012   1:13 PM                To: Corbin Ade, MD                       Kathlene November,                       Pancreas lesion looks cystic but I think EUS evaluation is a good next step.  Pancreatic source of her ascites would be unusual unless there is maligancy involved and I would expect more impressive CT scan (showing more clear cancer in pancreas or metastatically) and positive cytology so I find it hard to explain the ascites with pancreatic disease.                          Have you already told her about possibility of EUS?  We can get in touch with her once you give the green light.                       Jesusita Oka                                   -----  Message -----              From: Corbin Ade, MD              Sent: 05/27/2012   6:07 PM                To: Rachael Fee, MD                       Dan, could I get your opinion on this lady's CT scan.  Progressing abdominal pain, 50 pound weight loss over the past one year. Pancreatic head mass with multiple other abnormalities on CT. Recent paracentesis failed to demonstrate any evidence for malignancy or infection. The question is what to do next.  Multiple co-morbidities including significant pulmonary hypertension and diastolic heart failure. Question EUS versus PET versus hematology referral versus other? I'd appreciate your input.

## 2012-06-02 NOTE — Telephone Encounter (Signed)
Leighann, please set up EUS for this pt.

## 2012-06-02 NOTE — Telephone Encounter (Signed)
Message copied by Myra Rude on Mon Jun 02, 2012  9:09 AM ------      Message from: Corbin Ade      Created: Wed May 28, 2012  2:10 PM       Thanks for taking a look at this case. I spoke to patient's husband yesterday afternoon regarding an EUS as being the next step in the patient's evaluation. I also told him I would be speaking to you. So, I think you have the green light to make contact directly with the patient and proceed. I really appreciate your help.      ----- Message -----         From: Rachael Fee, MD         Sent: 05/28/2012   1:13 PM           To: Corbin Ade, MD            Kathlene November,            Pancreas lesion looks cystic but I think EUS evaluation is a good next step.  Pancreatic source of her ascites would be unusual unless there is maligancy involved and I would expect more impressive CT scan (showing more clear cancer in pancreas or metastatically) and positive cytology so I find it hard to explain the ascites with pancreatic disease.              Have you already told her about possibility of EUS?  We can get in touch with her once you give the green light.            Dan                  ----- Message -----         From: Corbin Ade, MD         Sent: 05/27/2012   6:07 PM           To: Rachael Fee, MD            Dan, could I get your opinion on this lady's CT scan.  Progressing abdominal pain, 50 pound weight loss over the past one year. Pancreatic head mass with multiple other abnormalities on CT. Recent paracentesis failed to demonstrate any evidence for malignancy or infection. The question is what to do next.  Multiple co-morbidities including significant pulmonary hypertension and diastolic heart failure. Question EUS versus PET versus hematology referral versus other? I'd appreciate your input.

## 2012-06-03 ENCOUNTER — Telehealth: Payer: Self-pay | Admitting: Gastroenterology

## 2012-06-03 NOTE — Telephone Encounter (Signed)
Kaleah asked that I speak with her cardiologist about sedation.  I just spoke with Dr. Monia Pouch, [facility] and explained that we were considering upper endoscopy with moderate or (more likely) MAC sedation.  Milan has significant pulmonary hypertension and also severe coronary artery disease (circ and LAD both disease and there are no options for interventions). Dr. Monia Pouch said moderate or MAC sedation would be more risky for her but if absolutely necessary we could go ahead.  She was clear however that if there is a lesion found in pancreas she could would never be 'cleared for a pancreatic surgery' from a cardiac standpoint. She feels Patsye's overall life expectancy is 3-4 years "probably less" due to cardiac, pulm HTN disease.    Kathlene November, I guess this changes my opinion a bit and I think that if further testing is needed a PET scan is what I would recommend now.  Oncology input is also a good idea.  I don't know that she could be offered any chemotherapy due to her underlying cardio-pulm disease even if a malignancy was proven.  I spoke with Jadesola and her husband, they seem to understand the situation pretty well. For now, I'm going to cancel the EUS.    Stacy Gardner, FYI.

## 2012-06-03 NOTE — Telephone Encounter (Signed)
See alternate phone note  

## 2012-06-03 NOTE — Telephone Encounter (Signed)
Pt has Acute pulmonary hypertension taking Flolan Dr Monia Pouch would like Dr Christella Hartigan to call  [phone] option #4 and ask for  Charlestine Night RN to discuss sedation.  Dr Monia Pouch does not think the pt should be sedated.  Dr Jones Broom is managing plavix and letter to be sent.

## 2012-06-03 NOTE — Telephone Encounter (Signed)
   Type  Contact  Phone   06/02/2012 2:54 PM  Phone (Incoming)  Avedisian,Larry (Emergency Contact)  [phone] (H)   Calling about the EUS that is sch'd...pt is a pt at [facility] and cannot be sedated without consulting with [facility]

## 2012-06-03 NOTE — Telephone Encounter (Signed)
i left a message about discussing sedation on crystal's voice mail, awaiting call bac

## 2012-06-03 NOTE — Telephone Encounter (Signed)
SEE ALTERNATE PHONE NOTE

## 2012-06-03 NOTE — Telephone Encounter (Signed)
06/03/2012    RE: CAISLEY BAXENDALE DOB: [birth year] MRN: [id number]   Dear Dr Gala Romney,    We have scheduled the above patient for an endoscopic procedure with Dr Christella Hartigan. Our records show that she is on anticoagulation therapy.   Please advise as to how long the patient may come off her therapy of Plavix prior to the procedure, which is scheduled for 06/19/12.  Please fax back/ or route the completed form to Rider Ermis  at [phone].   Sincerely,  Chales Abrahams CMA AAMA

## 2012-06-03 NOTE — Telephone Encounter (Signed)
Patient has DES in 10/12 so would be reluctant to stop Plavix until at least 10/13 unless an emergent indication. Also she has end-stage pulmonary HTN currently on IV Flolan. She is at very high risk for adverse events with sedation and would proceed only if procedure felt to be life-saving and relatively emergent.

## 2012-06-03 NOTE — Telephone Encounter (Signed)
APPT HAS BEEN CX WITH JILL AT [facility]

## 2012-06-04 NOTE — Telephone Encounter (Signed)
Referral has been faxed to Dr. Neijstrom 

## 2012-06-04 NOTE — Telephone Encounter (Signed)
Dr. Christella Hartigan input noted.EUS. Before getting a PET scan, I think we should get her to see Dr. Mariel Sleet; he can decide what other evaluation,if any, would be indicated. Please get her referred to Dr.Neijstrom

## 2012-06-04 NOTE — Telephone Encounter (Signed)
Pts husband is aware of referral to Dr. Mariel Sleet

## 2012-06-04 NOTE — Telephone Encounter (Signed)
Benedetto Goad, please refer to Dr. Mariel Sleet.

## 2012-06-12 ENCOUNTER — Other Ambulatory Visit: Payer: Self-pay | Admitting: Internal Medicine

## 2012-06-13 NOTE — Telephone Encounter (Signed)
..   Requested Prescriptions   Pending Prescriptions Disp Refills  . VYTORIN 10-40 MG per tablet [Pharmacy Med Name: Vytorin 10/40 Mg Tablet] 90 each 3    Sig: TAKE ONE TABLET AT BEDTIME.

## 2012-06-16 ENCOUNTER — Encounter (HOSPITAL_COMMUNITY): Payer: Medicare Other | Attending: Oncology | Admitting: Oncology

## 2012-06-16 ENCOUNTER — Encounter (HOSPITAL_COMMUNITY): Payer: Self-pay | Admitting: Oncology

## 2012-06-16 VITALS — BP 110/51 | HR 75 | Temp 97.1°F | Resp 20 | Ht 59.0 in | Wt 137.8 lb

## 2012-06-16 DIAGNOSIS — K8689 Other specified diseases of pancreas: Secondary | ICD-10-CM

## 2012-06-16 DIAGNOSIS — K869 Disease of pancreas, unspecified: Secondary | ICD-10-CM | POA: Insufficient documentation

## 2012-06-16 DIAGNOSIS — IMO0001 Reserved for inherently not codable concepts without codable children: Secondary | ICD-10-CM | POA: Insufficient documentation

## 2012-06-16 DIAGNOSIS — I2789 Other specified pulmonary heart diseases: Secondary | ICD-10-CM | POA: Insufficient documentation

## 2012-06-16 DIAGNOSIS — N289 Disorder of kidney and ureter, unspecified: Secondary | ICD-10-CM | POA: Insufficient documentation

## 2012-06-16 DIAGNOSIS — E119 Type 2 diabetes mellitus without complications: Secondary | ICD-10-CM | POA: Insufficient documentation

## 2012-06-16 LAB — COMPREHENSIVE METABOLIC PANEL
ALT: 11 U/L (ref 0–35)
Albumin: 3.3 g/dL — ABNORMAL LOW (ref 3.5–5.2)
Alkaline Phosphatase: 144 U/L — ABNORMAL HIGH (ref 39–117)
Chloride: 92 mEq/L — ABNORMAL LOW (ref 96–112)
Glucose, Bld: 146 mg/dL — ABNORMAL HIGH (ref 70–99)
Potassium: 3.7 mEq/L (ref 3.5–5.1)
Sodium: 135 mEq/L (ref 135–145)
Total Bilirubin: 0.2 mg/dL — ABNORMAL LOW (ref 0.3–1.2)
Total Protein: 6.4 g/dL (ref 6.0–8.3)

## 2012-06-16 LAB — CBC WITH DIFFERENTIAL/PLATELET
Basophils Relative: 0 % (ref 0–1)
Eosinophils Absolute: 0.2 10*3/uL (ref 0.0–0.7)
Hemoglobin: 8.7 g/dL — ABNORMAL LOW (ref 12.0–15.0)
Lymphs Abs: 0.9 10*3/uL (ref 0.7–4.0)
MCH: 27.2 pg (ref 26.0–34.0)
Neutro Abs: 7 10*3/uL (ref 1.7–7.7)
Neutrophils Relative %: 80 % — ABNORMAL HIGH (ref 43–77)
Platelets: 305 10*3/uL (ref 150–400)
RBC: 3.2 MIL/uL — ABNORMAL LOW (ref 3.87–5.11)

## 2012-06-16 NOTE — Progress Notes (Signed)
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 Her appetite is good diminished significantly. She does not have blood in her stools by history but she has had nausea. She also has some back discomfort which is not new or different however. She did have an ultrasound of her abdomen in 2011 which showed some mild diffuse fatty liver changes but the pancreas could not be visualized due to overlying gas in the bowel. She did not have a CAT scan at that time. She is accompanied by her husband of many years namely 46 years with their anniversary today and her 2 sisters. She lost 1 brother from a heart issue. Her mother died of metastatic head and neck cancer and her biological father died of heart disease. Her parents are separated when she was very young child. She has no children was never pregnant. She is not a smoker not a drinker. She was diagnosed with pulmonary hypertension within the last 3 years. She has been treated by a physician at [facility] and is presently on an infusion intravenously. Dr Ophelia Charter treats her at [facility]. She is in no acute distress presently. She is 4 feet 11 inches tall weights 137 pounds. She is afebrile. Also 82 and regular. Blood pressure 95/55. She has an IV in the right upper arm consistent with a PICC line. She has no lymphadenopathy in the cervical, subclavicular, infraclavicular, axillary, or inguinal areas. Her abdomen is somewhat distended I do think there is a fluid wave. I cannot feel splenomegaly  or hepatomegaly. She has 1+ pitting pretibial edema bilaterally slightly more prominent on the left leg. Pulses are diminished but present in the feet. She is alert she is oriented she wears oxygen. She has a been unremarkable oral exam. Tongue is unremarkable and in midline. She follows all commands. Rest exam is negative for masses. Her lungs show markedly diminished breath sounds. Her heart is not reveal clear-cut S3 gallop at this time. I discussed with her and her family the reason she is here mainly a mass in the pancreas. There are no prior CAT scans for comparison. The prior ultrasound 2011 as I mentioned did not visualize the pancreas. Therefore I would like to do some blood work only at this time. She was told that she cannot be put to sleep because of her primary hypertension and therefore a gastroenterologist in [place] did not do a trans-gastric pancreatic mass biopsy. She may be a candidate for an interventional radiologist to do a CAT scan guided biopsy if necessary. Before proceeding without I would like to talk to Dr. Kendell Bane and to her physician at [facility]. If she were to have cancer the pancreas she would not be a surgical candidate and I do not think she would be a candidate for chemotherapy with the severity of her pulmonary hypertension. She does not have a good performance status at this time. She and her family were fine with this decision at this juncture to go for the blood tests today and then a discussion  with her physicians based upon what I find. They would like to know if this truly is cancer if possible. I did tell them that some lesions in the pancreas are benign but that many are malignant. Some lesions however are very slow growing. I will see them in the near future.

## 2012-06-16 NOTE — Progress Notes (Signed)
Meagan [name] presented for labwork. Labs per MD order drawn via Peripheral Line 23 gauge needle inserted in left AC  Good blood return present. Procedure without incident.  Needle removed intact. Patient tolerated procedure well.

## 2012-06-16 NOTE — Patient Instructions (Addendum)
Meagan [name]  DOB February 05, 1946 CSN [id number]  MRN [id number] Dr. Glenford Peers   [facility]  Discharge Instructions  RECOMMENDATIONS MADE BY THE CONSULTANT AND ANY TEST RESULTS WILL BE SENT TO YOUR REFERRING DOCTOR.   EXAM FINDINGS BY MD TODAY AND SIGNS AND SYMPTOMS TO REPORT TO CLINIC OR PRIMARY MD: We need to determine what you have.  Will check some blood work today, Dr. Mariel Sleet will talk with your physician to see what else we can do.  MEDICATIONS PRESCRIBED: none   INSTRUCTIONS GIVEN AND DISCUSSED:   SPECIAL INSTRUCTIONS/FOLLOW-UP: Return to Clinic in 1 month.   I acknowledge that I have been informed and understand all the instructions given to me and received a copy. I do not have any more questions at this time, but understand that I may call the Specialty Clinic at [facility] at [phone] during business hours should I have any further questions or need assistance in obtaining follow-up care.    __________________________________________  _____________  __________ Signature of Patient or Authorized Representative            Date                   Time    __________________________________________ Nurse's Signature

## 2012-06-19 ENCOUNTER — Telehealth: Payer: Self-pay | Admitting: Internal Medicine

## 2012-06-19 ENCOUNTER — Encounter (HOSPITAL_COMMUNITY): Admission: RE | Payer: Self-pay | Source: Ambulatory Visit

## 2012-06-19 ENCOUNTER — Ambulatory Visit (HOSPITAL_COMMUNITY): Admission: RE | Admit: 2012-06-19 | Payer: Medicare Other | Source: Ambulatory Visit | Admitting: Gastroenterology

## 2012-06-19 ENCOUNTER — Other Ambulatory Visit (HOSPITAL_COMMUNITY): Payer: Self-pay | Admitting: Oncology

## 2012-06-19 DIAGNOSIS — K8689 Other specified diseases of pancreas: Secondary | ICD-10-CM

## 2012-06-19 SURGERY — UPPER ENDOSCOPIC ULTRASOUND (EUS) LINEAR
Anesthesia: Monitor Anesthesia Care

## 2012-06-19 NOTE — Telephone Encounter (Signed)
New problem:  Per Sheralyn Boatman calling from radiology dept. Patient need to be off plavix. Due to upcoming procedure.

## 2012-06-20 ENCOUNTER — Other Ambulatory Visit (HOSPITAL_COMMUNITY): Payer: Self-pay | Admitting: Oncology

## 2012-06-20 DIAGNOSIS — K8689 Other specified diseases of pancreas: Secondary | ICD-10-CM

## 2012-06-20 NOTE — Telephone Encounter (Signed)
Left mess on Toni's VM that Dr Gala Romney prefer pt not be off Plavix until October unless it is an urgent procedure to call back if she has questions

## 2012-06-20 NOTE — Progress Notes (Signed)
#  1 mass in the pancreas The patient is unable to be put to sleep for endoscopic biopsy. The 2 interventional radiologist that we have spoken to do not feel that they can get to this mass either. It we just not be safe to do the biopsy by CT guidance. Therefore we are going to repeat her CAT scan and blood work in 12 weeks and then see her back and see if there is any change by CAT scan criteria. This mass may still be benign potentially. I have spoken personally with the husband and the patient this morning and they are in agreement in proceeding forward with this plan

## 2012-07-10 ENCOUNTER — Telehealth (HOSPITAL_COMMUNITY): Payer: Self-pay

## 2012-07-14 ENCOUNTER — Ambulatory Visit (HOSPITAL_COMMUNITY): Payer: Medicare Other | Admitting: Oncology

## 2012-07-22 ENCOUNTER — Other Ambulatory Visit: Payer: Self-pay

## 2012-07-22 ENCOUNTER — Encounter (HOSPITAL_COMMUNITY): Payer: Self-pay | Admitting: *Deleted

## 2012-07-22 ENCOUNTER — Emergency Department (HOSPITAL_COMMUNITY): Payer: Medicare Other

## 2012-07-22 ENCOUNTER — Emergency Department (HOSPITAL_COMMUNITY)
Admission: EM | Admit: 2012-07-22 | Discharge: 2012-07-22 | Disposition: A | Discharge status: Home / Self Care | Payer: Medicare Other | Attending: Emergency Medicine | Admitting: Emergency Medicine

## 2012-07-22 DIAGNOSIS — Z951 Presence of aortocoronary bypass graft: Secondary | ICD-10-CM | POA: Insufficient documentation

## 2012-07-22 DIAGNOSIS — Z794 Long term (current) use of insulin: Secondary | ICD-10-CM | POA: Insufficient documentation

## 2012-07-22 DIAGNOSIS — Z79899 Other long term (current) drug therapy: Secondary | ICD-10-CM | POA: Insufficient documentation

## 2012-07-22 DIAGNOSIS — IMO0001 Reserved for inherently not codable concepts without codable children: Secondary | ICD-10-CM | POA: Insufficient documentation

## 2012-07-22 DIAGNOSIS — I5032 Chronic diastolic (congestive) heart failure: Secondary | ICD-10-CM | POA: Insufficient documentation

## 2012-07-22 DIAGNOSIS — E119 Type 2 diabetes mellitus without complications: Secondary | ICD-10-CM | POA: Insufficient documentation

## 2012-07-22 DIAGNOSIS — R55 Syncope and collapse: Secondary | ICD-10-CM | POA: Insufficient documentation

## 2012-07-22 DIAGNOSIS — D649 Anemia, unspecified: Secondary | ICD-10-CM | POA: Insufficient documentation

## 2012-07-22 LAB — BASIC METABOLIC PANEL
BUN: 28 mg/dL — ABNORMAL HIGH (ref 6–23)
Calcium: 9.1 mg/dL (ref 8.4–10.5)
Creatinine, Ser: 1.32 mg/dL — ABNORMAL HIGH (ref 0.50–1.10)
GFR calc non Af Amer: 41 mL/min — ABNORMAL LOW (ref >90)
Glucose, Bld: 169 mg/dL — ABNORMAL HIGH (ref 70–99)
Potassium: 4.3 mEq/L (ref 3.5–5.1)

## 2012-07-22 LAB — CBC WITH DIFFERENTIAL/PLATELET
Eosinophils Absolute: 0.3 10*3/uL (ref 0.0–0.7)
Eosinophils Relative: 2 % (ref 0–5)
Hemoglobin: 8.7 g/dL — ABNORMAL LOW (ref 12.0–15.0)
Lymphs Abs: 0.5 10*3/uL — ABNORMAL LOW (ref 0.7–4.0)
MCH: 27.8 pg (ref 26.0–34.0)
MCV: 85.9 fL (ref 78.0–100.0)
Monocytes Absolute: 0.5 10*3/uL (ref 0.1–1.0)
Monocytes Relative: 5 % (ref 3–12)
RBC: 3.13 MIL/uL — ABNORMAL LOW (ref 3.87–5.11)

## 2012-07-22 MED ORDER — AZITHROMYCIN 250 MG PO TABS
500.0000 mg | ORAL_TABLET | Freq: Once | ORAL | Status: AC
  Administered 2012-07-22: 500 mg via ORAL
  Filled 2012-07-22: qty 2

## 2012-07-22 MED ORDER — LEVOFLOXACIN 500 MG PO TABS: 500.0000 mg | ORAL_TABLET | Freq: Every day | ORAL | Status: DC

## 2012-07-22 NOTE — ED Notes (Signed)
Pt presents after 3 near syncope episodes and weakness. Pt is alert, oriented x 4 and no neurological deficits noted.

## 2012-07-22 NOTE — ED Notes (Signed)
Pt reports taking a nitro tablet at registration secondary to jaw pain as a side effect of flolance

## 2012-07-22 NOTE — ED Notes (Signed)
Pt with jaw pain earlier, took a SL Ntg while in triage, pt denies jaw pain at this time, pt with chronic dyspnea due to pulmonary htn

## 2012-07-22 NOTE — ED Provider Notes (Signed)
History     CSN: [id number]  Arrival date & time 07/22/12  1520   First MD Initiated Contact with Patient 07/22/12 1616      Chief Complaint  Patient presents with  . Near Syncope  . Weakness    (Consider location/radiation/quality/duration/timing/severity/associated sxs/prior treatment) HPI...Marland Kitchennear-syncope today approximately 3 times.  Patient has multiple health problems and is currently under the care of a cardiologist at [facility] for chronic congestive heart failure.  Slight shortness of breath but this is not unusual. No chest pain, fever, sweats, chills, dysuria. Husband and sister say she looks about normal.  Past Medical History  Diagnosis Date  . Fibromyalgia   . Diabetes mellitus   . Hypercholesterolemia   . Hypertension     pulmonary  pap 110/31 (mean 62)by RHC 9/10 negative PE by chest CT 2010  . Pulmonary edema     Failed Revatio   . ARF (acute renal failure)     poor candidate for ACE -I or ARB  . Restrictive lung disease     obesity hypoventilation syndrome  . Renal insufficiency   . CAD (coronary artery disease)     multivessel DES x 2 RCA 2007  . Chronic diastolic heart failure   . MRSA (methicillin resistant staphylococcus aureus) pneumonia   . Pulmonary hypertension   . Diastolic heart failure   . Chronic anemia     anemia of chronic disease based on anemia panel 2011    Past Surgical History  Procedure Date  . Abdominal hysterectomy   . Coronary artery bypass graft 1997    LIMA to LAD,SVG to OM  . Esophagogastroduodenoscopy 06/28/10    schatzki ring otherwise normal;small hiatal hernia    Family History  Problem Relation Age of Onset  . Cancer Mother     oral  . Heart attack Father   . COPD Brother   . Heart disease Brother     History  Substance Use Topics  . Smoking status: Never Smoker   . Smokeless tobacco: Not on file  . Alcohol Use: No    OB History    Grav Para Term Preterm Abortions TAB SAB Ect Mult Living          Review of Systems  All other systems reviewed and are negative.    Allergies  Aspirin; Codeine; Niacin; Nsaids; and Sulfonamide derivatives  Home Medications   Current Outpatient Rx  Name Route Sig Dispense Refill  . ALPRAZOLAM 0.5 MG PO TABS Oral Take 0.5 mg by mouth 2 (two) times daily as needed. For sleep. *Prescribed one tablet three times daily as needed*    . CLOPIDOGREL BISULFATE 75 MG PO TABS Oral Take 75 mg by mouth every morning.     . DULOXETINE HCL 60 MG PO CPEP Oral Take 60 mg by mouth every morning.     Marland Kitchen FLOLAN IV Intravenous Inject into the vein. 1.5 mg. Inject into the vein continuously. Infuse mg/kg/min via cont IV pump.    Marland Kitchen EZETIMIBE-SIMVASTATIN 10-40 MG PO TABS Oral Take 1 tablet by mouth at bedtime.    . FUROSEMIDE 40 MG PO TABS Oral Take 80 mg by mouth every morning. 80 mg in the morning and 40 mg in the afternoon    . HYDROMORPHONE HCL 4 MG PO TABS Oral Take 4 mg by mouth every 4 (four) hours as needed. For pain    . INSULIN GLARGINE 100 UNIT/ML [place] SOLN Subcutaneous Inject 18 Units into the skin every morning.     Marland Kitchen  ISOSORBIDE MONONITRATE ER 60 MG PO TB24 Oral Take 60 mg by mouth every morning.     Marland Kitchen LEVOTHYROXINE SODIUM 25 MCG PO TABS Oral Take 25 mcg by mouth every morning.     Marland Kitchen METOPROLOL SUCCINATE ER 100 MG PO TB24 Oral Take 100 mg by mouth every morning.     Marland Kitchen NAPROXEN SODIUM 220 MG PO TABS Oral Take 440 mg by mouth daily as needed. *Takes only as needed when other pain medications are not used*    . NITROGLYCERIN 0.4 MG SL SUBL Sublingual Place 0.4 mg under the tongue every 5 (five) minutes as needed.    Marland Kitchen OMEPRAZOLE 20 MG PO CPDR Oral Take 20 mg by mouth 2 (two) times daily.      Marland Kitchen ONDANSETRON HCL 8 MG PO TABS Oral Take 8 mg by mouth every 8 (eight) hours as needed. For nausea     . OXYGEN PERMEABLE LENS PRODUCTS SOLN Inhalation Inhale into the lungs continuous. 6 liters    . OXYMETAZOLINE HCL 0.05 % NA SOLN Nasal Place 2 sprays into the nose  daily as needed. For nasal congestion    . SILVER SULFADIAZINE 1 % EX CREA Topical Apply 1 application topically daily. Apply as needed to areas of breakdown on buttocks.    . SPIRONOLACTONE 25 MG PO TABS Oral Take 25 mg by mouth daily. *Taken  6 hours after Lasix 8mg     . TRAMADOL HCL 50 MG PO TABS Oral Take 50 mg by mouth every 3 (three) hours as needed. For pain. *takes with Dilaudid (Hydromorphone)      BP 109/51  Pulse 72  Temp 98.2 F (36.8 C) (Oral)  Resp 18  Ht 5\' 4"  (1.626 m)  Wt 139 lb (63.05 kg)  BMI 23.86 kg/m2  SpO2 94%  Physical Exam  Nursing note and vitals reviewed. Constitutional: She is oriented to person, place, and time. She appears well-developed and well-nourished.  HENT:  Head: Normocephalic and atraumatic.  Eyes: Conjunctivae normal and EOM are normal. Pupils are equal, round, and reactive to light.  Neck: Normal range of motion. Neck supple.  Cardiovascular: Normal rate, regular rhythm and normal heart sounds.   Pulmonary/Chest: Effort normal and breath sounds normal.       Intravenous port in right chest wall  Abdominal: Soft. Bowel sounds are normal.  Musculoskeletal: Normal range of motion.  Neurological: She is alert and oriented to person, place, and time.  Skin: Skin is warm and dry.  Psychiatric: She has a normal mood and affect.    ED Course  Procedures (including critical care time)  Labs Reviewed  CBC WITH DIFFERENTIAL - Abnormal; Notable for the following:    WBC 11.2 (*)     RBC 3.13 (*)     Hemoglobin 8.7 (*)     HCT 26.9 (*)     Neutrophils Relative 88 (*)     Neutro Abs 9.9 (*)     Lymphocytes Relative 5 (*)     Lymphs Abs 0.5 (*)     All other components within normal limits  BASIC METABOLIC PANEL - Abnormal; Notable for the following:    Chloride 93 (*)     CO2 35 (*)     Glucose, Bld 169 (*)     BUN 28 (*)     Creatinine, Ser 1.32 (*)     GFR calc non Af Amer 41 (*)     GFR calc Af Amer 47 (*)     All  other components  within normal limits  TROPONIN I   Dg Chest Port 1 View  07/22/2012  *RADIOLOGY REPORT*  Clinical Data: Syncope.  Hypertension.  PORTABLE CHEST - 1 VIEW  Comparison: 10/30/2011.  Findings: The right IJ catheter is in good position.  The heart is borderline enlarged.  There are chronic lung changes with pulmonary fibrosis.  I cannot exclude a superimposed right lower lobe infiltrate.  No definite pleural effusion.  IMPRESSION: Severe chronic lung change with possible superimposed right lower lobe infiltrate.   Original Report Authenticated By: P. Loralie Champagne, M.D.      No diagnosis found.   Date: 07/22/2012  Rate: 69  Rhythm: normal sinus rhythm  QRS Axis: normal  Intervals: normal  ST/T Wave abnormalities: TWI inferiorally/lat  Conduction Disutrbances:none  Narrative Interpretation:   Old EKG Reviewed: changes noted   MDM  X-ray reviewed. Questionable right lower lobe infiltrate. Chest x-ray results discussed with patient and her family. also anemia noted which has been chronic.  Offered patient to be admitted to hospital. She preferred to go home.  Rx Zithromax 500 mg tonight.  Discharged home on Levaquin 500 mg daily for 10 days. She'll follow up with her primary care doctor or [facility] this week..  Also instructed to return here if worse.       Donnetta Hutching, MD 07/22/12 2001

## 2012-07-31 ENCOUNTER — Other Ambulatory Visit (HOSPITAL_COMMUNITY): Payer: Self-pay | Admitting: Oncology

## 2012-07-31 ENCOUNTER — Telehealth (HOSPITAL_COMMUNITY): Payer: Self-pay | Admitting: Oncology

## 2012-07-31 DIAGNOSIS — R1900 Intra-abdominal and pelvic swelling, mass and lump, unspecified site: Secondary | ICD-10-CM

## 2012-07-31 NOTE — Telephone Encounter (Signed)
Spoke with pt's husband Peyton Najjar. Pt to come in tomorrow at 11 am for abdominal ultrasound and paracentesis if needed.

## 2012-07-31 NOTE — Telephone Encounter (Signed)
Call patient back.  I will order an [place] of abdomen and paracentesis if needed.  Call Rich at ext [phone] for date and time.

## 2012-08-01 ENCOUNTER — Other Ambulatory Visit (HOSPITAL_COMMUNITY): Payer: Self-pay | Admitting: Oncology

## 2012-08-01 ENCOUNTER — Encounter (HOSPITAL_COMMUNITY): Payer: Self-pay | Admitting: Oncology

## 2012-08-01 ENCOUNTER — Other Ambulatory Visit (HOSPITAL_COMMUNITY): Payer: Self-pay

## 2012-08-01 ENCOUNTER — Ambulatory Visit (HOSPITAL_COMMUNITY)
Admission: RE | Admit: 2012-08-01 | Discharge: 2012-08-01 | Disposition: A | Discharge status: Home / Self Care | Payer: Medicare Other | Source: Ambulatory Visit | Attending: Oncology | Admitting: Oncology

## 2012-08-01 ENCOUNTER — Telehealth (HOSPITAL_COMMUNITY): Payer: Self-pay | Admitting: Oncology

## 2012-08-01 DIAGNOSIS — K8689 Other specified diseases of pancreas: Secondary | ICD-10-CM

## 2012-08-01 DIAGNOSIS — R1900 Intra-abdominal and pelvic swelling, mass and lump, unspecified site: Secondary | ICD-10-CM

## 2012-08-01 DIAGNOSIS — R188 Other ascites: Secondary | ICD-10-CM | POA: Insufficient documentation

## 2012-08-01 HISTORY — DX: Other specified diseases of pancreas: K86.89

## 2012-08-01 LAB — PROTEIN, BODY FLUID: Total protein, fluid: 3.4 g/dL

## 2012-08-01 LAB — GLUCOSE, SEROUS FLUID

## 2012-08-01 LAB — LACTATE DEHYDROGENASE, PLEURAL OR PERITONEAL FLUID: LD, Fluid: 127 U/L — ABNORMAL HIGH (ref 3–23)

## 2012-08-01 NOTE — Telephone Encounter (Signed)
Patient reports abdominal swelling and distention.  Order placed for US Abdomen limited and diagnostic paracentesis.  Sent for cytology, LDH, Glucose, and Protein.  Suzzane Quilter

## 2012-08-01 NOTE — Procedures (Signed)
PreOperative Dx: Ascites Postoperative Dx: Ascites Procedure:   US guided paracentesis Radiologist:  Tyron Russell Anesthesia:  6.5 ml of 1% lidocaine Specimen:  2500 ml of yellow fluid EBL:   < 1 ml Complications: None

## 2012-08-29 ENCOUNTER — Encounter (HOSPITAL_COMMUNITY): Payer: Medicare Other | Attending: Oncology

## 2012-08-29 DIAGNOSIS — K8689 Other specified diseases of pancreas: Secondary | ICD-10-CM

## 2012-08-29 DIAGNOSIS — K869 Disease of pancreas, unspecified: Secondary | ICD-10-CM | POA: Insufficient documentation

## 2012-08-29 LAB — COMPREHENSIVE METABOLIC PANEL
AST: 20 U/L (ref 0–37)
Albumin: 3.5 g/dL (ref 3.5–5.2)
Alkaline Phosphatase: 117 U/L (ref 39–117)
BUN: 25 mg/dL — ABNORMAL HIGH (ref 6–23)
Chloride: 93 mEq/L — ABNORMAL LOW (ref 96–112)
Potassium: 3.4 mEq/L — ABNORMAL LOW (ref 3.5–5.1)
Total Bilirubin: 0.2 mg/dL — ABNORMAL LOW (ref 0.3–1.2)
Total Protein: 6.7 g/dL (ref 6.0–8.3)

## 2012-08-29 LAB — CANCER ANTIGEN 19-9: CA 19-9: 5.7 U/mL — ABNORMAL LOW (ref <35.0)

## 2012-08-29 NOTE — Progress Notes (Signed)
Meagan [name] presented for labwork. Labs per MD order drawn via Peripheral Line 25 gauge needle inserted in RFA Procedure without incident.  Patient tolerated procedure well. Patient refuses to drin the oral contrast for her scan Tuesday.

## 2012-09-02 ENCOUNTER — Ambulatory Visit (HOSPITAL_COMMUNITY)
Admission: RE | Admit: 2012-09-02 | Discharge: 2012-09-02 | Disposition: A | Discharge status: Home / Self Care | Payer: Medicare Other | Source: Ambulatory Visit | Attending: Oncology | Admitting: Oncology

## 2012-09-02 ENCOUNTER — Other Ambulatory Visit (HOSPITAL_COMMUNITY): Payer: Self-pay | Admitting: Oncology

## 2012-09-02 DIAGNOSIS — K8689 Other specified diseases of pancreas: Secondary | ICD-10-CM

## 2012-09-02 DIAGNOSIS — K869 Disease of pancreas, unspecified: Secondary | ICD-10-CM | POA: Insufficient documentation

## 2012-09-02 MED ORDER — IOHEXOL 300 MG/ML  SOLN
100.0000 mL | Freq: Once | INTRAMUSCULAR | Status: AC | PRN
  Administered 2012-09-02: 100 mL via INTRAVENOUS

## 2012-09-03 ENCOUNTER — Encounter (HOSPITAL_BASED_OUTPATIENT_CLINIC_OR_DEPARTMENT_OTHER): Payer: Medicare Other | Admitting: Oncology

## 2012-09-03 ENCOUNTER — Encounter (HOSPITAL_COMMUNITY): Payer: Self-pay | Admitting: Oncology

## 2012-09-03 VITALS — BP 130/58 | HR 72 | Temp 97.7°F | Resp 20 | Wt 128.0 lb

## 2012-09-03 DIAGNOSIS — K869 Disease of pancreas, unspecified: Secondary | ICD-10-CM

## 2012-09-03 DIAGNOSIS — K8689 Other specified diseases of pancreas: Secondary | ICD-10-CM

## 2012-09-03 DIAGNOSIS — I2789 Other specified pulmonary heart diseases: Secondary | ICD-10-CM

## 2012-09-03 DIAGNOSIS — E119 Type 2 diabetes mellitus without complications: Secondary | ICD-10-CM

## 2012-09-03 NOTE — Progress Notes (Signed)
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 Problem #2 severe pulmonary hypertension on therapy with reduction in her pulmonary artery pressures Problem #3 diabetes mellitus Problem #4 renal failure in the past but her labs are stable presently Problem #5 fibromyalgia Problem #6 hysterectomy for endometriosis and severe pelvic pain many years ago She is once again accompanied by her husband and her 2 sisters. She does not look any different and does not have any change on her review of systems. Her laboratory work is very stable including her cancer marker. I went over her CAT scan findings with her and her family and things are very very stable. There is no change in his 3.6 cm lesion. Therefore I think we need to repeat evaluated her with repeat scans in 6 months along with lab work. In the meantime she will return to her [facility] physician as scheduled. The patient does state that she can tell when her abdomen is more swollen with ascites. They are presently using 80 mg of Lasix in the morning and essentially none in the afternoon. They have also cut out her spironolactone. I therefore recommended first of all going over her medications with her [facility] physician when she sees her again in December but in the meantime I think she could easily try spironolactone 25 mg twice a day if need be for this increased swelling. Her potassium is slightly low the other day and we have increased her potassium for 7 days twice a day and then they will go back to the usual dose of once a day. They are pleased with the visit and we'll see [place] back in 6 months sooner if things change.

## 2012-09-03 NOTE — Patient Instructions (Addendum)
[facility]  Discharge Instructions  RECOMMENDATIONS MADE BY THE CONSULTANT AND ANY TEST RESULTS WILL BE SENT TO YOUR REFERRING DOCTOR.   EXAM FINDINGS BY MD TODAY AND SIGNS AND SYMPTOMS TO REPORT TO CLINIC OR PRIMARY MD:  Exam today per Dr. Mariel Sleet  MEDICATIONS PRESCRIBED: k dur 2 tablets twice a day for a week. Then go back to what you were taking. You can take your aldactone (spironolactone) once or twice a day for fluid in your belly.   INSTRUCTIONS GIVEN AND DISCUSSED: Labs and CT scans in 6 months then to see Dr. Mariel Sleet      I acknowledge that I have been informed and understand all the instructions given to me and received a copy. I do not have any more questions at this time, but understand that I may call the Specialty Clinic at [facility] at [phone] during business hours should I have any further questions or need assistance in obtaining follow-up care.    __________________________________________  _____________  __________ Signature of Patient or Authorized Representative            Date                   Time    __________________________________________ Nurse's Signature

## 2012-10-20 ENCOUNTER — Other Ambulatory Visit (HOSPITAL_COMMUNITY): Payer: Self-pay | Admitting: Oncology

## 2012-10-20 ENCOUNTER — Ambulatory Visit (HOSPITAL_COMMUNITY)
Admission: RE | Admit: 2012-10-20 | Discharge: 2012-10-20 | Disposition: A | Discharge status: Home / Self Care | Payer: Medicare Other | Source: Ambulatory Visit | Attending: Oncology | Admitting: Oncology

## 2012-10-20 DIAGNOSIS — K8689 Other specified diseases of pancreas: Secondary | ICD-10-CM

## 2012-10-20 DIAGNOSIS — R188 Other ascites: Secondary | ICD-10-CM | POA: Insufficient documentation

## 2012-12-22 ENCOUNTER — Ambulatory Visit (HOSPITAL_COMMUNITY)
Admission: RE | Admit: 2012-12-22 | Discharge: 2012-12-22 | Disposition: A | Discharge status: Home / Self Care | Payer: Medicare Other | Source: Ambulatory Visit | Attending: Oncology | Admitting: Oncology

## 2012-12-22 ENCOUNTER — Other Ambulatory Visit (HOSPITAL_COMMUNITY): Payer: Self-pay

## 2012-12-22 ENCOUNTER — Encounter (HOSPITAL_COMMUNITY): Payer: Medicare Other | Attending: Oncology | Admitting: Oncology

## 2012-12-22 ENCOUNTER — Telehealth (HOSPITAL_COMMUNITY): Payer: Self-pay | Admitting: Oncology

## 2012-12-22 ENCOUNTER — Other Ambulatory Visit (HOSPITAL_COMMUNITY): Payer: Self-pay | Admitting: Oncology

## 2012-12-22 VITALS — BP 127/53 | HR 67 | Temp 97.9°F

## 2012-12-22 DIAGNOSIS — K8689 Other specified diseases of pancreas: Secondary | ICD-10-CM

## 2012-12-22 DIAGNOSIS — R0602 Shortness of breath: Secondary | ICD-10-CM

## 2012-12-22 DIAGNOSIS — K869 Disease of pancreas, unspecified: Secondary | ICD-10-CM | POA: Insufficient documentation

## 2012-12-22 DIAGNOSIS — E119 Type 2 diabetes mellitus without complications: Secondary | ICD-10-CM | POA: Insufficient documentation

## 2012-12-22 DIAGNOSIS — I2789 Other specified pulmonary heart diseases: Secondary | ICD-10-CM | POA: Insufficient documentation

## 2012-12-22 LAB — COMPREHENSIVE METABOLIC PANEL
ALT: 26 U/L (ref 0–35)
BUN: 22 mg/dL (ref 6–23)
CO2: 34 mEq/L — ABNORMAL HIGH (ref 19–32)
Calcium: 9.6 mg/dL (ref 8.4–10.5)
Creatinine, Ser: 1.12 mg/dL — ABNORMAL HIGH (ref 0.50–1.10)
GFR calc Af Amer: 57 mL/min — ABNORMAL LOW (ref >90)
GFR calc non Af Amer: 49 mL/min — ABNORMAL LOW (ref >90)
Glucose, Bld: 122 mg/dL — ABNORMAL HIGH (ref 70–99)

## 2012-12-22 LAB — CBC WITH DIFFERENTIAL/PLATELET
Eosinophils Relative: 5 % (ref 0–5)
HCT: 29.4 % — ABNORMAL LOW (ref 36.0–46.0)
Lymphocytes Relative: 8 % — ABNORMAL LOW (ref 12–46)
Lymphs Abs: 0.8 10*3/uL (ref 0.7–4.0)
MCV: 89.1 fL (ref 78.0–100.0)
Monocytes Absolute: 1 10*3/uL (ref 0.1–1.0)
Monocytes Relative: 10 % (ref 3–12)
RBC: 3.3 MIL/uL — ABNORMAL LOW (ref 3.87–5.11)
WBC: 9.9 10*3/uL (ref 4.0–10.5)

## 2012-12-22 NOTE — Progress Notes (Signed)
#  1 pancreatic mass worrisome for primary cancer the pancreas but without change on her recent CT scan in November 2013 and her CA 19-9 remains in the normal range without change as well. Followup is scheduled for May  #2 severe pulmonary hypertension on therapy #3 diabetes mellitus #4 renal failure the past #5 fibromyalgia #6 hysterectomy for endometriosis and severe pelvic pain many years ago  She is a work in today. She has been having more jaw pain and neck pain in the last 2-4 weeks. She hasn't been using her nitroglycerin up perhaps not as much as she should. She may need a long-acting medication besides the IMDUR Which she is are you taking. I've encouraged to be more liberal with her sublingual nitroglycerin. She thought her ascites is coming back. Her abdomen no is very flat today. Her ultrasound of the abdomen showed essentially very little if any fluid whatsoever to that. She is in no acute distress her vital signs are stable and her nail color skin color is actually very adequate. Her O2 sats were 100% on 6 L but only 87% on her own O2 level of approximately 2 L.  She will see the doctor at [facility] next Tuesday to discuss these issues with her the right and I do not think there is anything else to do other than continued to be liberal with her nitroglycerin. We will otherwise see her in May

## 2012-12-22 NOTE — Telephone Encounter (Signed)
Pt called wanted to know if she could go to radiology and have fluid removed from her stomach let her know asap thanks dob is (1946-03-30)

## 2012-12-22 NOTE — Telephone Encounter (Signed)
Message copied by Evelena Leyden on Mon Dec 22, 2012 10:56 AM ------      Message from: Mariel Sleet, ERIC S      Created: Mon Dec 22, 2012  9:47 AM       Will need u/s of abdomen to see if feasible for paracentesis      If ok then we can schedule same day-right?? ------

## 2012-12-22 NOTE — Patient Instructions (Addendum)
[facility] Discharge Instructions  RECOMMENDATIONS MADE BY THE CONSULTANT AND ANY TEST RESULTS WILL BE SENT TO YOUR REFERRING PHYSICIAN.  EXAM FINDINGS BY THE PHYSICIAN TODAY AND SIGNS OR SYMPTOMS TO REPORT TO CLINIC OR PRIMARY PHYSICIAN: Your xray did not show any fluid in your abdomen.  You may want to take your nitroglycerin just before you do your routine in Rehab.  MEDICATIONS PRESCRIBED:  none  INSTRUCTIONS GIVEN AND DISCUSSED: Let your MD @ [facility] know about the jaw pain that your are having.  SPECIAL INSTRUCTIONS/FOLLOW-UP: Follow-up as scheduled.  Thank you for choosing [facility] to provide your oncology and hematology care.  To afford each patient quality time with our providers, please arrive at least 15 minutes before your scheduled appointment time.  With your help, our goal is to use those 15 minutes to complete the necessary work-up to ensure our physicians have the information they need to help with your evaluation and healthcare recommendations.    Effective January 1st, 2014, we ask that you re-schedule your appointment with our physicians should you arrive 10 or more minutes late for your appointment.  We strive to give you quality time with our providers, and arriving late affects you and other patients whose appointments are after yours.    Again, thank you for choosing [facility].  Our hope is that these requests will decrease the amount of time that you wait before being seen by our physicians.       _____________________________________________________________  Should you have questions after your visit to [facility], please contact our office at [phone] between the hours of 8:30 a.m. and 5:00 p.m.  Voicemails left after 4:30 p.m. will not be returned until the following business day.  For prescription refill requests, have your pharmacy contact our office with your prescription refill request.

## 2013-01-15 ENCOUNTER — Encounter (HOSPITAL_COMMUNITY): Payer: Self-pay | Admitting: *Deleted

## 2013-01-15 ENCOUNTER — Encounter (HOSPITAL_COMMUNITY): Payer: Self-pay | Admitting: Pharmacy Technician

## 2013-01-15 ENCOUNTER — Ambulatory Visit (HOSPITAL_COMMUNITY)
Admission: RE | Admit: 2013-01-15 | Discharge: 2013-01-15 | Disposition: A | Discharge status: Home / Self Care | Payer: Medicare Other | Source: Ambulatory Visit | Attending: Internal Medicine | Admitting: Internal Medicine

## 2013-01-15 ENCOUNTER — Other Ambulatory Visit (HOSPITAL_COMMUNITY): Payer: Self-pay | Admitting: Adult Health

## 2013-01-15 VITALS — BP 122/56 | HR 81 | Wt 142.8 lb

## 2013-01-15 DIAGNOSIS — R079 Chest pain, unspecified: Secondary | ICD-10-CM

## 2013-01-15 DIAGNOSIS — I2510 Atherosclerotic heart disease of native coronary artery without angina pectoris: Secondary | ICD-10-CM | POA: Insufficient documentation

## 2013-01-15 DIAGNOSIS — I2789 Other specified pulmonary heart diseases: Secondary | ICD-10-CM

## 2013-01-15 DIAGNOSIS — D638 Anemia in other chronic diseases classified elsewhere: Secondary | ICD-10-CM | POA: Insufficient documentation

## 2013-01-15 DIAGNOSIS — M542 Cervicalgia: Secondary | ICD-10-CM

## 2013-01-15 DIAGNOSIS — I5032 Chronic diastolic (congestive) heart failure: Secondary | ICD-10-CM

## 2013-01-15 DIAGNOSIS — E7800 Pure hypercholesterolemia, unspecified: Secondary | ICD-10-CM | POA: Insufficient documentation

## 2013-01-15 DIAGNOSIS — E119 Type 2 diabetes mellitus without complications: Secondary | ICD-10-CM | POA: Insufficient documentation

## 2013-01-15 DIAGNOSIS — IMO0001 Reserved for inherently not codable concepts without codable children: Secondary | ICD-10-CM | POA: Insufficient documentation

## 2013-01-15 DIAGNOSIS — J811 Chronic pulmonary edema: Secondary | ICD-10-CM | POA: Insufficient documentation

## 2013-01-15 DIAGNOSIS — I10 Essential (primary) hypertension: Secondary | ICD-10-CM | POA: Insufficient documentation

## 2013-01-15 LAB — BASIC METABOLIC PANEL WITH GFR
BUN: 32 mg/dL — ABNORMAL HIGH (ref 6–23)
CO2: 32 meq/L (ref 19–32)
Calcium: 9.2 mg/dL (ref 8.4–10.5)
Chloride: 94 meq/L — ABNORMAL LOW (ref 96–112)
Creatinine, Ser: 1.23 mg/dL — ABNORMAL HIGH (ref 0.50–1.10)
GFR calc Af Amer: 51 mL/min — ABNORMAL LOW
GFR calc non Af Amer: 44 mL/min — ABNORMAL LOW
Glucose, Bld: 174 mg/dL — ABNORMAL HIGH (ref 70–99)
Potassium: 4.2 meq/L (ref 3.5–5.1)
Sodium: 136 meq/L (ref 135–145)

## 2013-01-15 NOTE — Assessment & Plan Note (Addendum)
Functional capacity much improved on Flolan. Will perform RHC at time of coronary angio to reassess hemodynamic response to therapy.

## 2013-01-15 NOTE — Patient Instructions (Addendum)
Heart Cath Wed 4/2 see instruction sheet  Your physician recommends that you schedule a follow-up appointment in: 1 month

## 2013-01-15 NOTE — Progress Notes (Signed)
Patient ID: Meagan [name], female   DOB: 03/13/1946, 67 y.o.   MRN: [id number] GI: [facility] Oncologist: Dr Neijstrom Cardiologsit: Dr Fortin- [facility] IV Flolan  HPI: Meagan [name] is a 67 y/o woman with multiple medical problems including CAD s/p CABG, DM2, obesity, fibromyalgia, diastolic HF, and pancreatic lesion. CABG 1997, Stents 2007  LHC/RHC 9/10: Bypass graft patent. RA 24 PA 110/31 (62) PCWP 34 Fick 3.3/1.8 PVR 8.5. Symptoms improved somewhat with diuresis.  CT chest 10/10: No PE or ILD PFTs 10/10: Restrictive lung disease FEV1 1.01L (56%) FVC 1.2 (48%) DLCO 24% Rheum work-up which was negative. Sleep study 11/10: negative for OSA  Saw Dr. Byrum who felt PH was multifactorial but  Felt trial of Revatio was warranted. 2/11: started revatio. No apparent benefit. So it was stopped. Was hospitalized for respiratory failure and in 4/11: revatio restarted but subsequently stopped as it wasn't helping.   Had RHC 01/19/11: RA 12, RV 101/1/22 PA 99/19 (50) PCWP 18 Fick cardiac output 2.6, cardiac index of 1.5.  PVR 12.3  Femoral artery saturation was 94%.  PA saturation was 50% and 56% with a mean of 53.   01/03/12  RA = 15  RV = 100/8/20  PA = 95/24 (57)  PCW = 16  Fick cardiac output/index = 6.3/3.6  PVR = 6.5  FA sat = 94%  PA sat = 61%,66%  Ao Pressure: 116/56 (82)  LV Pressure: 128/14/20 LIMA-LAD: widely patent with 40-50 lesion in LAD after insertion of LIMA  SVG-LCX: totally occluded proximally (new)   Pulmonary angio of the right pulmonary artery showed a relatively normal angiogram except for question of a small cut off in sub-branch in the right lower lobe.  On the left, pulmonary artery appeared to have normal anatomy, no evidence of acute cut off or significant pruning. Was referred to [facility] for IV Flolan. Recently seen by Dr. Fortin and 6MW much improved however referred here for further evalaution for neck and jaw pain.   She returns for an acute work in due to exertional neck  and jaw pain. It occurs reproducibly with exertion mostly walking. Seems to be progressing. Not related to chewing. No real CP. Remains SOB with dyspnea. . She is trying to attend pulmonary rehab 2-3 times a week but over the last couple of weeks has struggled. Weight at home 120-138 pounds. She was evaluated by GI in 2013 due to nausea. Pancreatic lesion identified. Now followed by Dr Neijstorm.  Have recommended routine CT to follow lesion every 3 months.     ROS: All other systems normal except as mentioned in HPI, past medical history and problem list.    Past Medical History  Diagnosis Date  . Fibromyalgia   . Diabetes mellitus   . Hypercholesterolemia   . Hypertension     pulmonary  pap 110/31 (mean 62)by RHC 9/10 negative PE by chest CT 2010  . Pulmonary edema     Failed Revatio   . ARF (acute renal failure)     poor candidate for ACE -I or ARB  . Restrictive lung disease     obesity hypoventilation syndrome  . Renal insufficiency   . CAD (coronary artery disease)     multivessel DES x 2 RCA 2007  . Chronic diastolic heart failure   . MRSA (methicillin resistant staphylococcus aureus) pneumonia   . Pulmonary hypertension   . Diastolic heart failure   . Chronic anemia     anemia of chronic disease based on anemia   panel 2011  . Pancreatic mass 08/01/2012    Current Outpatient Prescriptions  Medication Sig Dispense Refill  . ALPRAZolam (XANAX) 0.5 MG tablet Take 0.5 mg by mouth 2 (two) times daily as needed. For sleep. *Prescribed one tablet three times daily as needed*      . clopidogrel (PLAVIX) 75 MG tablet Take 75 mg by mouth every morning.       . DULoxetine (CYMBALTA) 60 MG capsule Take 60 mg by mouth every morning.       . Epoprostenol Sodium (FLOLAN IV) Inject into the vein. 1.5 mg. Inject into the vein continuously. Infuse mg/kg/min via cont IV pump.      . ezetimibe-simvastatin (VYTORIN) 10-40 MG per tablet Take 1 tablet by mouth at bedtime.      . furosemide  (LASIX) 40 MG tablet Take 80 mg by mouth every morning. 80 mg in the morning and 40 mg in the afternoon      . HYDROmorphone (DILAUDID) 4 MG tablet Take 4 mg by mouth every 4 (four) hours as needed. For pain      . insulin glargine (LANTUS) 100 UNIT/ML injection Inject 28 Units into the skin every morning.       . isosorbide mononitrate (IMDUR) 60 MG 24 hr tablet Take 90 mg by mouth every morning.       . levothyroxine (SYNTHROID, LEVOTHROID) 25 MCG tablet Take 25 mcg by mouth every morning.       . metoprolol (TOPROL-XL) 100 MG 24 hr tablet Take 100 mg by mouth every morning.       . naproxen sodium (ALEVE) 220 MG tablet Take 440 mg by mouth daily as needed. *Takes only as needed when other pain medications are not used*      . nitroGLYCERIN (NITROSTAT) 0.4 MG SL tablet Place 0.4 mg under the tongue every 5 (five) minutes as needed for chest pain.       . omeprazole (PRILOSEC) 20 MG capsule Take 20 mg by mouth 2 (two) times daily.        . ondansetron (ZOFRAN) 8 MG tablet Take 8 mg by mouth every 8 (eight) hours as needed. For nausea       . Oxygen Permeable Lens Products SOLN Inhale into the lungs continuous. 6 liters      . oxymetazoline (NASAL SPRAY 12 HOUR) 0.05 % nasal spray Place 2 sprays into the nose daily as needed. For nasal congestion      . potassium chloride SA (K-DUR,KLOR-CON) 20 MEQ tablet Take 40 mEq by mouth 2 (two) times daily.       . silver sulfADIAZINE (SILVADENE) 1 % cream Apply 1 application topically daily. Apply as needed to areas of breakdown on buttocks.      . spironolactone (ALDACTONE) 25 MG tablet Take 25 mg by mouth daily. *Taken  6 hours after Lasix 80 mg      . traMADol (ULTRAM) 50 MG tablet Take 50 mg by mouth every 3 (three) hours as needed. For pain. *takes with Dilaudid (Hydromorphone)      . [DISCONTINUED] cetirizine (ZYRTEC) 10 MG tablet Take 10 mg by mouth daily.         No current facility-administered medications for this encounter.     Allergies   Allergen Reactions  . Aspirin     REACTION: Intolerance  . Codeine Other (See Comments)    Messes with digestive system  . Niacin Other (See Comments)    Pins sticking into skin  .   Nsaids Other (See Comments)    REACTION: Aggravates Digestive System   . Sulfonamide Derivatives Other (See Comments)    Messes with digestive system    History   Social History  . Marital Status: Married    Spouse Name: N/A    Number of Children: N/A  . Years of Education: N/A   Occupational History  . Not on file.   Social History Main Topics  . Smoking status: Never Smoker   . Smokeless tobacco: Not on file  . Alcohol Use: No  . Drug Use: No  . Sexually Active:    Other Topics Concern  . Not on file   Social History Narrative  . No narrative on file    Family History  Problem Relation Age of Onset  . Cancer Mother     oral  . Heart attack Father   . COPD Brother   . Heart disease Brother     PHYSICAL EXAM: Filed Vitals:   01/15/13 1010  BP: 122/56  Pulse: 81  General: Chronically ill-appearing. Wearing 6L [place] O2 husband and sister present HEENT: normal Neck: thick . no JVD. Carotids 2+ bilat; no bruits. No lymphadenopathy or thryomegaly appreciated. Cor: PMI nonpalpable Regular rate & rhythm. No rubs, gallops or murmurs. P2 perhaps mildly accentuated but not severely so Lungs: clear RML and RLL decreased throughout no wheezing on 6 liters [place] Abdomen: obese. soft, nontender, nondistended. No hepatosplenomegaly. No bruits or masses. Good bowel sounds. Extremities: no cyanosis, clubbing, rash, edema Neuro: alert & oriented x 3, cranial nerves grossly intact. moves all 4 extremities w/o difficulty. Affect pleasant.    ASSESSMENT & PLAN:  

## 2013-01-15 NOTE — Assessment & Plan Note (Signed)
Pattern very concerning for angina. Discussed need for repeat cath and they agree.Will schedule for next week. Call 911 if symptoms getting worse.

## 2013-01-15 NOTE — Assessment & Plan Note (Deleted)
Schedule for cath to re-evaluate coronaries.

## 2013-01-15 NOTE — Assessment & Plan Note (Addendum)
Volume status stable. Continue current diuretic regimen. Follow up in 1 month.

## 2013-01-21 ENCOUNTER — Encounter (HOSPITAL_COMMUNITY)
Admission: RE | Disposition: A | Discharge status: Home / Self Care | Payer: Self-pay | Source: Ambulatory Visit | Attending: Internal Medicine

## 2013-01-21 ENCOUNTER — Ambulatory Visit (HOSPITAL_COMMUNITY)
Admission: RE | Admit: 2013-01-21 | Discharge: 2013-01-21 | Disposition: A | Discharge status: Home / Self Care | Payer: Medicare Other | Source: Ambulatory Visit | Attending: Internal Medicine | Admitting: Internal Medicine

## 2013-01-21 DIAGNOSIS — I2510 Atherosclerotic heart disease of native coronary artery without angina pectoris: Secondary | ICD-10-CM

## 2013-01-21 DIAGNOSIS — R0602 Shortness of breath: Secondary | ICD-10-CM | POA: Insufficient documentation

## 2013-01-21 DIAGNOSIS — I25810 Atherosclerosis of coronary artery bypass graft(s) without angina pectoris: Secondary | ICD-10-CM | POA: Insufficient documentation

## 2013-01-21 DIAGNOSIS — R6884 Jaw pain: Secondary | ICD-10-CM | POA: Insufficient documentation

## 2013-01-21 DIAGNOSIS — I2582 Chronic total occlusion of coronary artery: Secondary | ICD-10-CM | POA: Insufficient documentation

## 2013-01-21 DIAGNOSIS — R079 Chest pain, unspecified: Secondary | ICD-10-CM

## 2013-01-21 HISTORY — PX: LEFT AND RIGHT HEART CATHETERIZATION WITH CORONARY/GRAFT ANGIOGRAM: SHX5448

## 2013-01-21 LAB — POCT I-STAT 3, VENOUS BLOOD GAS (G3P V)
Acid-Base Excess: 4 mmol/L — ABNORMAL HIGH (ref 0.0–2.0)
O2 Saturation: 59 %
TCO2: 31 mmol/L (ref 0–100)
pH, Ven: 7.377 — ABNORMAL HIGH (ref 7.250–7.300)

## 2013-01-21 LAB — BASIC METABOLIC PANEL
CO2: 30 mEq/L (ref 19–32)
Chloride: 97 mEq/L (ref 96–112)
Creatinine, Ser: 1.01 mg/dL (ref 0.50–1.10)
Potassium: 4.7 mEq/L (ref 3.5–5.1)

## 2013-01-21 LAB — CBC
HCT: 27.5 % — ABNORMAL LOW (ref 36.0–46.0)
Hemoglobin: 9.1 g/dL — ABNORMAL LOW (ref 12.0–15.0)
MCV: 88.1 fL (ref 78.0–100.0)
RBC: 3.12 MIL/uL — ABNORMAL LOW (ref 3.87–5.11)
RDW: 13.3 % (ref 11.5–15.5)
WBC: 9.9 10*3/uL (ref 4.0–10.5)

## 2013-01-21 LAB — GLUCOSE, CAPILLARY
Glucose-Capillary: 91 mg/dL (ref 70–99)
Glucose-Capillary: 80 mg/dL (ref 70–99)

## 2013-01-21 LAB — POCT I-STAT 3, ART BLOOD GAS (G3+)
Acid-Base Excess: 4 mmol/L — ABNORMAL HIGH (ref 0.0–2.0)
Bicarbonate: 30.7 mEq/L — ABNORMAL HIGH (ref 20.0–24.0)
O2 Saturation: 93 %
TCO2: 32 mmol/L (ref 0–100)
pO2, Arterial: 71 mmHg — ABNORMAL LOW (ref 80.0–100.0)

## 2013-01-21 SURGERY — LEFT AND RIGHT HEART CATHETERIZATION WITH CORONARY/GRAFT ANGIOGRAM
Anesthesia: LOCAL

## 2013-01-21 MED ORDER — SODIUM CHLORIDE 0.9 % IV SOLN: INTRAVENOUS | Status: DC

## 2013-01-21 MED ORDER — SODIUM CHLORIDE 0.9 % IJ SOLN: 3.0000 mL | Freq: Two times a day (BID) | INTRAMUSCULAR | Status: DC

## 2013-01-21 MED ORDER — HEPARIN (PORCINE) IN NACL 2-0.9 UNIT/ML-% IJ SOLN
INTRAMUSCULAR | Status: AC
  Filled 2013-01-21: qty 1000

## 2013-01-21 MED ORDER — ACETAMINOPHEN 325 MG PO TABS: 650.0000 mg | ORAL_TABLET | ORAL | Status: DC | PRN

## 2013-01-21 MED ORDER — FENTANYL CITRATE 0.05 MG/ML IJ SOLN
INTRAMUSCULAR | Status: AC
  Filled 2013-01-21: qty 2

## 2013-01-21 MED ORDER — SODIUM CHLORIDE 0.9 % IV SOLN
250.0000 mL | INTRAVENOUS | Status: DC | PRN
Start: 2013-01-21 — End: 2013-01-21

## 2013-01-21 MED ORDER — SODIUM CHLORIDE 0.9 % IV SOLN
INTRAVENOUS | Status: DC
  Administered 2013-01-21: 13:00:00 via INTRAVENOUS

## 2013-01-21 MED ORDER — ONDANSETRON HCL 4 MG/2ML IJ SOLN
INTRAMUSCULAR | Status: AC
  Filled 2013-01-21: qty 2

## 2013-01-21 MED ORDER — ONDANSETRON HCL 4 MG/2ML IJ SOLN: 4.0000 mg | Freq: Four times a day (QID) | INTRAMUSCULAR | Status: DC | PRN

## 2013-01-21 MED ORDER — OXYCODONE-ACETAMINOPHEN 5-325 MG PO TABS: 1.0000 | ORAL_TABLET | ORAL | Status: DC | PRN

## 2013-01-21 MED ORDER — LIDOCAINE HCL (PF) 1 % IJ SOLN
INTRAMUSCULAR | Status: AC
  Filled 2013-01-21: qty 30

## 2013-01-21 MED ORDER — SODIUM CHLORIDE 0.9 % IJ SOLN: 3.0000 mL | INTRAMUSCULAR | Status: DC | PRN

## 2013-01-21 MED ORDER — MIDAZOLAM HCL 2 MG/2ML IJ SOLN
INTRAMUSCULAR | Status: AC
  Filled 2013-01-21: qty 2

## 2013-01-21 NOTE — Interval H&P Note (Signed)
History and Physical Interval Note:  01/21/2013 1:43 PM  Meagan [name]  has presented today for surgery, with the diagnosis of cp  The various methods of treatment have been discussed with the patient and family. After consideration of risks, benefits and other options for treatment, the patient has consented to  Procedure(s): LEFT AND RIGHT HEART CATHETERIZATION WITH CORONARY/GRAFT ANGIOGRAM (N/A) as a surgical intervention .  The patient's history has been reviewed, patient examined, no change in status, stable for surgery.  I have reviewed the patient's chart and labs.  Questions were answered to the patient's satisfaction.     Daniel Bensimhon

## 2013-01-21 NOTE — CV Procedure (Signed)
Cardiac Cath Procedure Note  Indication: Increasing exertional chest and jaw pain. Severe PAH on Flolan.  Procedures performed:  1) Right heart cathererization 2) Selective coronary angiography 3) Left heart catheterization 4) Left ventriculogram 5) SVG angio 6) LIMA angio     Description of procedure:   The risks and indication of the procedure were explained. Consent was signed and placed on the chart. An appropriate timeout was taken prior to the procedure. The right groin was prepped and draped in the routine sterile fashion and anesthetized with 1% local lidocaine.   The RFA was easily cannulated but we were initially unable to feed a wire through the vessel due to obstruction. We then stuck above the initial puncture site and were able to feed the wire easily. A 5 FR arterial sheath was placed in the right femoral artery using a modified Seldinger technique. Standard catheters including a JL4, JR4 and angled pigtail were used. All catheter exchanges were made over a wire. A 7 FR venous sheath was placed in the right femoral vein using a modified Seldinger technique. A standard Swan-Ganz catheter was used for the procedure.   Complications: None apparent   Total contrast: 70cc  Findings:  RA = 11  RV = 82/0/15 PA = 78/18 (39)  PCW = 19 Fick cardiac output/index = 6.1/3.6  PVR = 3.3 Woods FA sat = 93% PA sat = 62%,64%   Ao Pressure: 106/39 (65)  LV Pressure: 124/5/16   There was no signficant gradient across the aortic valve on pullback.   Left main: Heavily calcified. Distal 40-50%   LAD: Diffuse severe disease. Totalled in midsection. Mid to distal vessel fills from LIMA. 2 diagonals with diffuse disease. There is a 70-80% lesion in the midsection of the 2nd diagonal  LCX: Small heavily diseased vessel. 30-40% ostial. Long 95% lesion in mid AV groove after take-off of OM-1. Distal vessel small and diffusely diseased. OM-1 mild plaque. OM-2 and OM-3 occluded   RCA:  Ostial spasm. Long stent in midsection is patent. Distal RCA 40% before PDA. Mild to moderate plaque in PD and PLs.   LV-gram done in the RAO projection: Ejection fraction = 60-65% No wall motion abnormalities   LIMA-LAD: widely patent with 50-60% lesion in LAD after insertion of LIMA   SVG-OM: totally occluded proximally (chronic)   Assessment:  1. Severe 3v CAD  2. LIMA patent  3. Chronic occlusion of SVG-OM 4. Normal LV function  5. Moderate to severe PAH much improved on Flolan  Plan/Discussion:   Coronary anatomy unchanged from previous. She has several areas for potential ischemia but none are easily revascularizable. Would continue medical therapy. I do not think lesion in LAD after insertion of LIMA is flow-limiting. However, if symptoms continue could consider Myoview to assess for LAD ischemia. PAH much improved with Flolan    Arvilla Meres, MD 2:29 PM

## 2013-01-21 NOTE — H&P (View-Only) (Signed)
Patient ID: Meagan [name], female   DOB: Jun 01, 1946, 67 y.o.   MRN: [id number] GI: [facility] Oncologist: Dr Mariel Sleet Cardiologsit: Dr Monia Pouch- [facility] IV Flolan  HPI: Meagan [name] is a 67 y/o woman with multiple medical problems including CAD s/p CABG, DM2, obesity, fibromyalgia, diastolic HF, and pancreatic lesion. CABG 1997, Stents 2007  LHC/RHC 9/10: Bypass graft patent. RA 24 PA 110/31 (62) PCWP 34 Fick 3.3/1.8 PVR 8.5. Symptoms improved somewhat with diuresis.  CT chest 10/10: No PE or ILD PFTs 10/10: Restrictive lung disease FEV1 1.01L (56%) FVC 1.2 (48%) DLCO 24% Rheum work-up which was negative. Sleep study 11/10: negative for OSA  Saw Dr. Delton Coombes who felt PH was multifactorial but  Felt trial of Revatio was warranted. 2/11: started revatio. No apparent benefit. So it was stopped. Was hospitalized for respiratory failure and in 4/11: revatio restarted but subsequently stopped as it wasn't helping.   Had RHC 01/19/11: RA 12, RV 101/1/22 PA 99/19 (50) PCWP 18 Fick cardiac output 2.6, cardiac index of 1.5.  PVR 12.3  Femoral artery saturation was 94%.  PA saturation was 50% and 56% with a mean of 53.   01/03/12  RA = 15  RV = 100/8/20  PA = 95/24 (57)  PCW = 16  Fick cardiac output/index = 6.3/3.6  PVR = 6.5  FA sat = 94%  PA sat = 61%,66%  Ao Pressure: 116/56 (82)  LV Pressure: 128/14/20 LIMA-LAD: widely patent with 40-50 lesion in LAD after insertion of LIMA  SVG-LCX: totally occluded proximally (new)   Pulmonary angio of the right pulmonary artery showed a relatively normal angiogram except for question of a small cut off in sub-branch in the right lower lobe.  On the left, pulmonary artery appeared to have normal anatomy, no evidence of acute cut off or significant pruning. Was referred to [facility] for IV Flolan. Recently seen by Dr. Monia Pouch and much improved however referred here for further evalaution for neck and jaw pain.   She returns for an acute work in due to exertional neck  and jaw pain. It occurs reproducibly with exertion mostly walking. Seems to be progressing. Not related to chewing. No real CP. Remains SOB with dyspnea. . She is trying to attend pulmonary rehab 2-3 times a week but over the last couple of weeks has struggled. Weight at home 120-138 pounds. She was evaluated by GI in 2013 due to nausea. Pancreatic lesion identified. Now followed by Dr Alvy Bimler.  Have recommended routine CT to follow lesion every 3 months.     ROS: All other systems normal except as mentioned in HPI, past medical history and problem list.    Past Medical History  Diagnosis Date  . Fibromyalgia   . Diabetes mellitus   . Hypercholesterolemia   . Hypertension     pulmonary  pap 110/31 (mean 62)by RHC 9/10 negative PE by chest CT 2010  . Pulmonary edema     Failed Revatio   . ARF (acute renal failure)     poor candidate for ACE -I or ARB  . Restrictive lung disease     obesity hypoventilation syndrome  . Renal insufficiency   . CAD (coronary artery disease)     multivessel DES x 2 RCA 2007  . Chronic diastolic heart failure   . MRSA (methicillin resistant staphylococcus aureus) pneumonia   . Pulmonary hypertension   . Diastolic heart failure   . Chronic anemia     anemia of chronic disease based on anemia  panel 2011  . Pancreatic mass 08/01/2012    Current Outpatient Prescriptions  Medication Sig Dispense Refill  . ALPRAZolam (XANAX) 0.5 MG tablet Take 0.5 mg by mouth 2 (two) times daily as needed. For sleep. *Prescribed one tablet three times daily as needed*      . clopidogrel (PLAVIX) 75 MG tablet Take 75 mg by mouth every morning.       . DULoxetine (CYMBALTA) 60 MG capsule Take 60 mg by mouth every morning.       Marland Kitchen Epoprostenol Sodium (FLOLAN IV) Inject into the vein. 1.5 mg. Inject into the vein continuously. Infuse mg/kg/min via cont IV pump.      Marland Kitchen ezetimibe-simvastatin (VYTORIN) 10-40 MG per tablet Take 1 tablet by mouth at bedtime.      . furosemide  (LASIX) 40 MG tablet Take 80 mg by mouth every morning. 80 mg in the morning and 40 mg in the afternoon      . HYDROmorphone (DILAUDID) 4 MG tablet Take 4 mg by mouth every 4 (four) hours as needed. For pain      . insulin glargine (LANTUS) 100 UNIT/ML injection Inject 28 Units into the skin every morning.       . isosorbide mononitrate (IMDUR) 60 MG 24 hr tablet Take 90 mg by mouth every morning.       Marland Kitchen levothyroxine (SYNTHROID, LEVOTHROID) 25 MCG tablet Take 25 mcg by mouth every morning.       . metoprolol (TOPROL-XL) 100 MG 24 hr tablet Take 100 mg by mouth every morning.       . naproxen sodium (ALEVE) 220 MG tablet Take 440 mg by mouth daily as needed. *Takes only as needed when other pain medications are not used*      . nitroGLYCERIN (NITROSTAT) 0.4 MG SL tablet Place 0.4 mg under the tongue every 5 (five) minutes as needed for chest pain.       Marland Kitchen omeprazole (PRILOSEC) 20 MG capsule Take 20 mg by mouth 2 (two) times daily.        . ondansetron (ZOFRAN) 8 MG tablet Take 8 mg by mouth every 8 (eight) hours as needed. For nausea       . Oxygen Permeable Lens Products SOLN Inhale into the lungs continuous. 6 liters      . oxymetazoline (NASAL SPRAY 12 HOUR) 0.05 % nasal spray Place 2 sprays into the nose daily as needed. For nasal congestion      . potassium chloride SA (K-DUR,KLOR-CON) 20 MEQ tablet Take 40 mEq by mouth 2 (two) times daily.       . silver sulfADIAZINE (SILVADENE) 1 % cream Apply 1 application topically daily. Apply as needed to areas of breakdown on buttocks.      Marland Kitchen spironolactone (ALDACTONE) 25 MG tablet Take 25 mg by mouth daily. *Taken  6 hours after Lasix 80 mg      . traMADol (ULTRAM) 50 MG tablet Take 50 mg by mouth every 3 (three) hours as needed. For pain. *takes with Dilaudid (Hydromorphone)      . [DISCONTINUED] cetirizine (ZYRTEC) 10 MG tablet Take 10 mg by mouth daily.         No current facility-administered medications for this encounter.     Allergies   Allergen Reactions  . Aspirin     REACTION: Intolerance  . Codeine Other (See Comments)    Messes with digestive system  . Niacin Other (See Comments)    Pins sticking into skin  .  Nsaids Other (See Comments)    REACTION: Aggravates Digestive System   . Sulfonamide Derivatives Other (See Comments)    Messes with digestive system    History   Social History  . Marital Status: Married    Spouse Name: N/A    Number of Children: N/A  . Years of Education: N/A   Occupational History  . Not on file.   Social History Main Topics  . Smoking status: Never Smoker   . Smokeless tobacco: Not on file  . Alcohol Use: No  . Drug Use: No  . Sexually Active:    Other Topics Concern  . Not on file   Social History Narrative  . No narrative on file    Family History  Problem Relation Age of Onset  . Cancer Mother     oral  . Heart attack Father   . COPD Brother   . Heart disease Brother     PHYSICAL EXAM: Filed Vitals:   01/15/13 1010  BP: 122/56  Pulse: 81  General: Chronically ill-appearing. Wearing 6L [place] O2 husband and sister present HEENT: normal Neck: thick . no JVD. Carotids 2+ bilat; no bruits. No lymphadenopathy or thryomegaly appreciated. Cor: PMI nonpalpable Regular rate & rhythm. No rubs, gallops or murmurs. P2 perhaps mildly accentuated but not severely so Lungs: clear RML and RLL decreased throughout no wheezing on 6 liters [place] Abdomen: obese. soft, nontender, nondistended. No hepatosplenomegaly. No bruits or masses. Good bowel sounds. Extremities: no cyanosis, clubbing, rash, edema Neuro: alert & oriented x 3, cranial nerves grossly intact. moves all 4 extremities w/o difficulty. Affect pleasant.    ASSESSMENT & PLAN:

## 2013-02-19 ENCOUNTER — Encounter (HOSPITAL_COMMUNITY): Payer: Self-pay

## 2013-02-19 ENCOUNTER — Ambulatory Visit (HOSPITAL_COMMUNITY)
Admission: RE | Admit: 2013-02-19 | Discharge: 2013-02-19 | Disposition: A | Discharge status: Home / Self Care | Payer: Medicare Other | Source: Ambulatory Visit | Attending: Internal Medicine | Admitting: Internal Medicine

## 2013-02-19 VITALS — BP 118/52 | HR 69 | Wt 134.8 lb

## 2013-02-19 DIAGNOSIS — E119 Type 2 diabetes mellitus without complications: Secondary | ICD-10-CM | POA: Insufficient documentation

## 2013-02-19 DIAGNOSIS — I2510 Atherosclerotic heart disease of native coronary artery without angina pectoris: Secondary | ICD-10-CM | POA: Insufficient documentation

## 2013-02-19 DIAGNOSIS — E7800 Pure hypercholesterolemia, unspecified: Secondary | ICD-10-CM | POA: Insufficient documentation

## 2013-02-19 DIAGNOSIS — I2789 Other specified pulmonary heart diseases: Secondary | ICD-10-CM | POA: Insufficient documentation

## 2013-02-19 DIAGNOSIS — I5032 Chronic diastolic (congestive) heart failure: Secondary | ICD-10-CM | POA: Insufficient documentation

## 2013-02-19 DIAGNOSIS — I10 Essential (primary) hypertension: Secondary | ICD-10-CM | POA: Insufficient documentation

## 2013-02-19 NOTE — Patient Instructions (Addendum)
Follow up in 3 months   Do the following things EVERYDAY: 1) Weigh yourself in the morning before breakfast. Write it down and keep it in a log. 2) Take your medicines as prescribed 3) Eat low salt foods-Limit salt (sodium) to 2000 mg per day.  4) Stay as active as you can everyday 5) Limit all fluids for the day to less than 2 liters  

## 2013-02-19 NOTE — Assessment & Plan Note (Signed)
Functionally doing better.  Able to perform ADLs. PAH much improved with Flolan per RHC 01/15/13.  Continue Flolan . Volume status stable. Continue current diuretic regimen. Continue pulmonary rehab. Follow up in 3 months.

## 2013-02-19 NOTE — Progress Notes (Signed)
Patient ID: Meagan [name], female   DOB: April 26, 1946, 67 y.o.   MRN: [id number] GI: Dr Jena Gauss Oncologist: Dr Mariel Sleet Cardiologsit: Dr Monia Pouch- [facility] IV Flolan  HPI: [name] is a 67 y/o woman with multiple medical problems including CAD s/p CABG, DM2, obesity, fibromyalgia, diastolic HF, and pancreatic lesion. CABG 1997, Stents 2007  LHC/RHC 9/10: Bypass graft patent. RA 24 PA 110/31 (62) PCWP 34 Fick 3.3/1.8 PVR 8.5. Symptoms improved somewhat with diuresis.  CT chest 10/10: No PE or ILD PFTs 10/10: Restrictive lung disease FEV1 1.01L (56%) FVC 1.2 (48%) DLCO 24% Rheum work-up which was negative. Sleep study 11/10: negative for OSA  Saw Dr. Delton Coombes who felt PH was multifactorial but  Felt trial of Revatio was warranted. 2/11: started revatio. No apparent benefit. So it was stopped. Was hospitalized for respiratory failure and in 4/11: revatio restarted but subsequently stopped as it wasn't helping.   Had RHC 01/19/11: RA 12, RV 101/1/22 PA 99/19 (50) PCWP 18 Fick cardiac output 2.6, cardiac index of 1.5.  PVR 12.3  Femoral artery saturation was 94%.  PA saturation was 50% and 56% with a mean of 53.   01/03/12  RA = 15  RV = 100/8/20  PA = 95/24 (57)  PCW = 16  Fick cardiac output/index = 6.3/3.6  PVR = 6.5  FA sat = 94%  PA sat = 61%,66%  Ao Pressure: 116/56 (82)  LV Pressure: 128/14/20 LIMA-LAD: widely patent with 40-50 lesion in LAD after insertion of LIMA  SVG-LCX: totally occluded proximally (new)   Pulmonary angio of the right pulmonary artery showed a relatively normal angiogram except for question of a small cut off in sub-branch in the right lower lobe.  On the left, pulmonary artery appeared to have normal anatomy, no evidence of acute cut off or significant pruning. Was referred to [facility] for IV Flolan. Recently seen by Dr. Monia Pouch and much improved however referred here for further evalaution for neck and jaw pain.   RHC/LHC 01/21/13  RA = 11  RV = 82/0/15  PA = 78/18  (39)  PCW = 19  Fick cardiac output/index = 6.1/3.6  PVR = 3.3 Woods  FA sat = 93%  PA sat = 62%,64%  Ao Pressure: 106/39 (65)  LV Pressure: 124/5/16 Left main: Heavily calcified. Distal 40-50%  LAD: Diffuse severe disease. Totalled in midsection. Mid to distal vessel fills from LIMA. 2 diagonals with diffuse disease. There is a 70-80% lesion in the midsection of the 2nd diagonal  LCX: Small heavily diseased vessel. 30-40% ostial. Long 95% lesion in mid AV groove after take-off of OM-1. Distal vessel small and diffusely diseased. OM-1 mild plaque. OM-2 and OM-3 occluded  RCA: Ostial spasm. Long stent in midsection is patent. Distal RCA 40% before PDA. Mild to moderate plaque in PD and PLs.  LV-gram done in the RAO projection: Ejection fraction = 60-65% No wall motion abnormalities  LIMA-LAD: widely patent with 50-60% lesion in LAD after insertion of LIMA  SVG-OM: totally occluded proximally (chronic)   She returns for follow up. She says her breathing is much better. Jaw pain less frequent. Exertional dyspnea. Denies PND/CP/syncope. Able to perform ADLs with out assistance. On 6 liters continuously.  Weight at home 120-138 pounds. Evaluated by Dr Monia Pouch last month. 6 MW had improved. Continues pulmonary rehab 2-3 times a week. Follow up with Dr Monia Pouch in June. Her husband performs dressing changes and assists with Flolan administration. Appetite improved.    Pancreatic lesion  identified. Now followed by Dr Alvy Bimler.  Have recommended routine CT to follow lesion every 3 months.     ROS: All other systems normal except as mentioned in HPI, past medical history and problem list.    Past Medical History  Diagnosis Date  . Fibromyalgia   . Diabetes mellitus   . Hypercholesterolemia   . Hypertension     pulmonary  pap 110/31 (mean 62)by RHC 9/10 negative PE by chest CT 2010  . Pulmonary edema     Failed Revatio   . ARF (acute renal failure)     poor candidate for ACE -I or ARB  .  Restrictive lung disease     obesity hypoventilation syndrome  . Renal insufficiency   . CAD (coronary artery disease)     multivessel DES x 2 RCA 2007  . Chronic diastolic heart failure   . MRSA (methicillin resistant staphylococcus aureus) pneumonia   . Pulmonary hypertension   . Diastolic heart failure   . Chronic anemia     anemia of chronic disease based on anemia panel 2011  . Pancreatic mass 08/01/2012    Current Outpatient Prescriptions  Medication Sig Dispense Refill  . ALPRAZolam (XANAX) 0.5 MG tablet Take 0.5 mg by mouth 2 (two) times daily as needed. For sleep. *Prescribed one tablet three times daily as needed*      . clopidogrel (PLAVIX) 75 MG tablet Take 75 mg by mouth every morning.       . DULoxetine (CYMBALTA) 60 MG capsule Take 60 mg by mouth every morning.       Marland Kitchen Epoprostenol Sodium (FLOLAN IV) Inject into the vein. 1.5 mg. Inject into the vein continuously. Infuse mg/kg/min via cont IV pump.      Marland Kitchen ezetimibe-simvastatin (VYTORIN) 10-40 MG per tablet Take 1 tablet by mouth at bedtime.      . furosemide (LASIX) 40 MG tablet Take 80 mg by mouth every morning. 80 mg in the morning and 40 mg in the afternoon      . HYDROmorphone (DILAUDID) 4 MG tablet Take 4 mg by mouth every 4 (four) hours as needed. For pain      . insulin glargine (LANTUS) 100 UNIT/ML injection Inject 28 Units into the skin every morning.       . isosorbide mononitrate (IMDUR) 60 MG 24 hr tablet Take 90 mg by mouth every morning.       Marland Kitchen levothyroxine (SYNTHROID, LEVOTHROID) 25 MCG tablet Take 25 mcg by mouth every morning.       . metoprolol (TOPROL-XL) 100 MG 24 hr tablet Take 100 mg by mouth every morning.       . naproxen sodium (ALEVE) 220 MG tablet Take 440 mg by mouth daily as needed. *Takes only as needed when other pain medications are not used*      . nitroGLYCERIN (NITROSTAT) 0.4 MG SL tablet Place 0.4 mg under the tongue every 5 (five) minutes as needed for chest pain.       Marland Kitchen omeprazole  (PRILOSEC) 20 MG capsule Take 20 mg by mouth 2 (two) times daily.        . ondansetron (ZOFRAN) 8 MG tablet Take 8 mg by mouth every 8 (eight) hours as needed. For nausea       . Oxygen Permeable Lens Products SOLN Inhale into the lungs continuous. 6 liters      . oxymetazoline (NASAL SPRAY 12 HOUR) 0.05 % nasal spray Place 2 sprays into the nose daily as needed.  For nasal congestion      . potassium chloride SA (K-DUR,KLOR-CON) 20 MEQ tablet Take 40 mEq by mouth 2 (two) times daily.       . silver sulfADIAZINE (SILVADENE) 1 % cream Apply 1 application topically daily. Apply as needed to areas of breakdown on buttocks.      Marland Kitchen spironolactone (ALDACTONE) 25 MG tablet Take 25 mg by mouth daily. *Taken  6 hours after Lasix 80 mg      . traMADol (ULTRAM) 50 MG tablet Take 50 mg by mouth every 3 (three) hours as needed. For pain. *takes with Dilaudid (Hydromorphone)      . [DISCONTINUED] cetirizine (ZYRTEC) 10 MG tablet Take 10 mg by mouth daily.         No current facility-administered medications for this encounter.     Allergies  Allergen Reactions  . Aspirin     REACTION: Intolerance  . Codeine Other (See Comments)    Messes with digestive system  . Niacin Other (See Comments)    Pins sticking into skin  . Nsaids Other (See Comments)    REACTION: Aggravates Digestive System   . Sulfonamide Derivatives Other (See Comments)    Messes with digestive system    History   Social History  . Marital Status: Married    Spouse Name: N/A    Number of Children: N/A  . Years of Education: N/A   Occupational History  . Not on file.   Social History Main Topics  . Smoking status: Never Smoker   . Smokeless tobacco: Not on file  . Alcohol Use: No  . Drug Use: No  . Sexually Active:    Other Topics Concern  . Not on file   Social History Narrative  . No narrative on file    Family History  Problem Relation Age of Onset  . Cancer Mother     oral  . Heart attack Father   . COPD  Brother   . Heart disease Brother     PHYSICAL EXAM: Filed Vitals:   02/19/13 1405  BP: 118/52  Pulse: 69  General: Chronically ill-appearing. Wearing 6L [place] O2 husband and sister present HEENT: normal Neck: thick . no JVD. Carotids 2+ bilat; no bruits. No lymphadenopathy or thryomegaly appreciated. Cor: PMI nonpalpable Regular rate & rhythm. No rubs, gallops or murmurs. P2 perhaps mildly accentuated but not severely so Lungs: clear RML and RLL decreased throughout no wheezing on 6 liters [place] Abdomen: obese. soft, nontender, nondistended. No hepatosplenomegaly. No bruits or masses. Good bowel sounds. Extremities: no cyanosis, clubbing, rash, edema Neuro: alert & oriented x 3, cranial nerves grossly intact. moves all 4 extremities w/o difficulty. Affect pleasant.    ASSESSMENT & PLAN:

## 2013-02-23 ENCOUNTER — Encounter (HOSPITAL_COMMUNITY): Payer: Medicare Other | Attending: Oncology

## 2013-02-23 DIAGNOSIS — K869 Disease of pancreas, unspecified: Secondary | ICD-10-CM | POA: Insufficient documentation

## 2013-02-23 DIAGNOSIS — K8689 Other specified diseases of pancreas: Secondary | ICD-10-CM

## 2013-02-23 LAB — COMPREHENSIVE METABOLIC PANEL
ALT: 10 U/L (ref 0–35)
Albumin: 3.4 g/dL — ABNORMAL LOW (ref 3.5–5.2)
Alkaline Phosphatase: 103 U/L (ref 39–117)
Glucose, Bld: 151 mg/dL — ABNORMAL HIGH (ref 70–99)
Potassium: 4 mEq/L (ref 3.5–5.1)
Sodium: 137 mEq/L (ref 135–145)
Total Protein: 6.2 g/dL (ref 6.0–8.3)

## 2013-02-23 LAB — CBC WITH DIFFERENTIAL/PLATELET
Basophils Absolute: 0 10*3/uL (ref 0.0–0.1)
Basophils Relative: 0 % (ref 0–1)
Eosinophils Absolute: 0.5 10*3/uL (ref 0.0–0.7)
Eosinophils Relative: 6 % — ABNORMAL HIGH (ref 0–5)
HCT: 28.5 % — ABNORMAL LOW (ref 36.0–46.0)
Hemoglobin: 9.6 g/dL — ABNORMAL LOW (ref 12.0–15.0)
Lymphocytes Relative: 10 % — ABNORMAL LOW (ref 12–46)
Lymphs Abs: 0.9 10*3/uL (ref 0.7–4.0)
MCH: 29.9 pg (ref 26.0–34.0)
MCHC: 33.7 g/dL (ref 30.0–36.0)
MCV: 88.8 fL (ref 78.0–100.0)
Monocytes Absolute: 0.8 10*3/uL (ref 0.1–1.0)
Monocytes Relative: 8 % (ref 3–12)
Neutro Abs: 7 10*3/uL (ref 1.7–7.7)
Neutrophils Relative %: 76 % (ref 43–77)
Platelets: 241 10*3/uL (ref 150–400)
RBC: 3.21 MIL/uL — ABNORMAL LOW (ref 3.87–5.11)
RDW: 12.9 % (ref 11.5–15.5)
WBC: 9.3 10*3/uL (ref 4.0–10.5)

## 2013-02-23 LAB — CANCER ANTIGEN 19-9: CA 19-9: 5 U/mL — ABNORMAL LOW (ref <35.0)

## 2013-02-23 NOTE — Progress Notes (Signed)
Labs drawn today for cbc/diff,cmp,ca19-9 

## 2013-03-02 ENCOUNTER — Ambulatory Visit (HOSPITAL_COMMUNITY)
Admission: RE | Admit: 2013-03-02 | Discharge: 2013-03-02 | Disposition: A | Discharge status: Home / Self Care | Payer: Medicare Other | Source: Ambulatory Visit | Attending: Oncology | Admitting: Oncology

## 2013-03-02 DIAGNOSIS — I10 Essential (primary) hypertension: Secondary | ICD-10-CM | POA: Insufficient documentation

## 2013-03-02 DIAGNOSIS — K869 Disease of pancreas, unspecified: Secondary | ICD-10-CM | POA: Insufficient documentation

## 2013-03-02 DIAGNOSIS — E119 Type 2 diabetes mellitus without complications: Secondary | ICD-10-CM | POA: Insufficient documentation

## 2013-03-02 DIAGNOSIS — K8689 Other specified diseases of pancreas: Secondary | ICD-10-CM

## 2013-03-02 DIAGNOSIS — Z951 Presence of aortocoronary bypass graft: Secondary | ICD-10-CM | POA: Insufficient documentation

## 2013-03-02 MED ORDER — IOHEXOL 300 MG/ML  SOLN
100.0000 mL | Freq: Once | INTRAMUSCULAR | Status: AC | PRN
  Administered 2013-03-02: 100 mL via INTRAVENOUS

## 2013-03-04 ENCOUNTER — Encounter (HOSPITAL_BASED_OUTPATIENT_CLINIC_OR_DEPARTMENT_OTHER): Payer: Medicare Other | Admitting: Oncology

## 2013-03-04 ENCOUNTER — Encounter (HOSPITAL_COMMUNITY): Payer: Self-pay | Admitting: Oncology

## 2013-03-04 VITALS — BP 131/69 | HR 71 | Temp 98.0°F | Resp 18 | Wt 139.0 lb

## 2013-03-04 DIAGNOSIS — K8689 Other specified diseases of pancreas: Secondary | ICD-10-CM

## 2013-03-04 DIAGNOSIS — F411 Generalized anxiety disorder: Secondary | ICD-10-CM

## 2013-03-04 DIAGNOSIS — I2789 Other specified pulmonary heart diseases: Secondary | ICD-10-CM

## 2013-03-04 DIAGNOSIS — K869 Disease of pancreas, unspecified: Secondary | ICD-10-CM

## 2013-03-04 DIAGNOSIS — L989 Disorder of the skin and subcutaneous tissue, unspecified: Secondary | ICD-10-CM

## 2013-03-04 NOTE — Patient Instructions (Addendum)
.  [facility] Discharge Instructions  RECOMMENDATIONS MADE BY THE CONSULTANT AND ANY TEST RESULTS WILL BE SENT TO YOUR REFERRING PHYSICIAN.  EXAM FINDINGS BY THE PHYSICIAN TODAY AND SIGNS OR SYMPTOMS TO REPORT TO CLINIC OR PRIMARY PHYSICIAN:  Exam per Dr. Mariel Sleet   INSTRUCTIONS GIVEN AND DISCUSSED: CT scans and labs in one year  SPECIAL INSTRUCTIONS/FOLLOW-UP: Exam with our PA in one year  Thank you for choosing [facility] to provide your oncology and hematology care.  To afford each patient quality time with our providers, please arrive at least 15 minutes before your scheduled appointment time.  With your help, our goal is to use those 15 minutes to complete the necessary work-up to ensure our physicians have the information they need to help with your evaluation and healthcare recommendations.    Effective January 1st, 2014, we ask that you re-schedule your appointment with our physicians should you arrive 10 or more minutes late for your appointment.  We strive to give you quality time with our providers, and arriving late affects you and other patients whose appointments are after yours.    Again, thank you for choosing [facility].  Our hope is that these requests will decrease the amount of time that you wait before being seen by our physicians.       _____________________________________________________________  Should you have questions after your visit to [facility], please contact our office at [phone] between the hours of 8:30 a.m. and 5:00 p.m.  Voicemails left after 4:30 p.m. will not be returned until the following business day.  For prescription refill requests, have your pharmacy contact our office with your prescription refill request.

## 2013-03-04 NOTE — Progress Notes (Signed)
#  1 pancreatic mass stable now for the last 3 CT scans of the abdomen. Her CA19-9 also remains well within the normal range. Therefore since she is stable over these 3 CAT scans we will repeat one in one year and if it's Astelin changed as well I suspect she can have your discharge from this clinic or an elective CT scan of the abdomen one more time a year later. 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  #2 severe pulmonary hypertension she clearly looks like she is breathing better clinically.  #3 smear skin lesion central for that just above the midline above the nose area approximately 7 mm across slightly heaped up borders which may be benign but I want her see her dermatologist in the near future. I cannot exclude a basal cell carcinoma but she states it's, up with the last several months.  #4 diabetes mellitus #5 hysterectomy for endometriosis and severe pelvic pain years ago #6 chronic anxiety and depression over her pulmonary hypertension and this mass that has worried her.  As above she is very stable. She and her husband and sister are delighted with the news. She is still of course going back to [facility] for her pulmonary hypertension which her husband states is clearly getting better with this new drug therapy. She clearly looks like she is breathing better as I mentioned above.  Marland Kitchen She wanted me to look at her PICC line which causes her a lot of itching in the skin underneath the patch is erythematous. I am not sure there is anything to do about that other than what he is doing.  We will see her in a year with blood work and a CAT scan and I suspect she can be released or again just observed on a yearly basis if there is no change once again.

## 2013-05-25 ENCOUNTER — Encounter (HOSPITAL_COMMUNITY): Payer: Self-pay

## 2013-05-25 ENCOUNTER — Ambulatory Visit (HOSPITAL_COMMUNITY)
Admission: RE | Admit: 2013-05-25 | Discharge: 2013-05-25 | Disposition: A | Discharge status: Home / Self Care | Payer: Medicare Other | Source: Ambulatory Visit | Attending: Internal Medicine | Admitting: Internal Medicine

## 2013-05-25 VITALS — BP 118/50 | HR 64 | Wt 144.8 lb

## 2013-05-25 DIAGNOSIS — I5032 Chronic diastolic (congestive) heart failure: Secondary | ICD-10-CM

## 2013-05-25 DIAGNOSIS — I2789 Other specified pulmonary heart diseases: Secondary | ICD-10-CM | POA: Insufficient documentation

## 2013-05-25 DIAGNOSIS — I2510 Atherosclerotic heart disease of native coronary artery without angina pectoris: Secondary | ICD-10-CM

## 2013-05-25 NOTE — Patient Instructions (Addendum)
ECHO in September.  F/U 6 months.  Continue medications as prescribed.

## 2013-05-30 NOTE — Progress Notes (Signed)
GI: Dr Jena Gauss Oncologist: Dr Mariel Sleet Cardiologsit: Dr Monia Pouch- [facility] IV Flolan  HPI: [name] is a 67 y/o woman with multiple medical problems including CAD s/p CABG, DM2, obesity, fibromyalgia, diastolic HF. She has severe PAH and is on Flolan followed by Dr. Monia Pouch at [facility].   CT chest 10/10: No PE or ILD PFTs 10/10: Restrictive lung disease FEV1 1.01L (56%) FVC 1.2 (48%) DLCO 24% Rheum work-up which was negative. Sleep study 11/10: negative for OSA  RHC/LHC 01/21/13  RA = 11  RV = 82/0/15  PA = 78/18 (39)  PCW = 19  Fick cardiac output/index = 6.1/3.6  PVR = 3.3 Woods  FA sat = 93%  PA sat = 62%,64%  Ao Pressure: 106/39 (65)  LV Pressure: 124/5/16 Left main: Heavily calcified. Distal 40-50%  LAD: Diffuse severe disease. Totalled in midsection. Mid to distal vessel fills from LIMA. 2 diagonals with diffuse disease. There is a 70-80% lesion in the midsection of the 2nd diagonal  LCX: Small heavily diseased vessel. 30-40% ostial. Long 95% lesion in mid AV groove after take-off of OM-1. Distal vessel small and diffusely diseased. OM-1 mild plaque. OM-2 and OM-3 occluded  RCA: Ostial spasm. Long stent in midsection is patent. Distal RCA 40% before PDA. Mild to moderate plaque in PD and PLs.  LV-gram done in the RAO projection: Ejection fraction = 60-65% No wall motion abnormalities  LIMA-LAD: widely patent with 50-60% lesion in LAD after insertion of LIMA  SVG-OM: totally occluded proximally (chronic)   Follow up: Remains on Flolan. Reports she still is having exertional dyspnea but this is improved.  Denies CP/syncope/PND or orthopnea. Weight at home 138-140bs. Pulmonary rehab closed at [place] so purchased home NuStep and worksout 30 minutes a day.  Performing ADLs without assistance. 5L O2 continuously, she cut down from 6 by herself.    ROS: All systems negative except as listed in HPI, PMH and Problem List.  Past Medical History  Diagnosis Date  . Fibromyalgia   . Diabetes  mellitus   . Hypercholesterolemia   . Hypertension     pulmonary  pap 110/31 (mean 62)by RHC 9/10 negative PE by chest CT 2010  . Pulmonary edema     Failed Revatio   . ARF (acute renal failure)     poor candidate for ACE -I or ARB  . Restrictive lung disease     obesity hypoventilation syndrome  . Renal insufficiency   . CAD (coronary artery disease)     multivessel DES x 2 RCA 2007  . Chronic diastolic heart failure   . MRSA (methicillin resistant staphylococcus aureus) pneumonia   . Pulmonary hypertension   . Diastolic heart failure   . Chronic anemia     anemia of chronic disease based on anemia panel 2011  . Pancreatic mass 08/01/2012    Current Outpatient Prescriptions  Medication Sig Dispense Refill  . ALPRAZolam (XANAX) 0.5 MG tablet Take 0.5 mg by mouth 2 (two) times daily as needed. For sleep. *Prescribed one tablet three times daily as needed*      . clopidogrel (PLAVIX) 75 MG tablet Take 75 mg by mouth every morning.       . cyclobenzaprine (FLEXERIL) 10 MG tablet Take 10 mg by mouth 2 (two) times daily as needed for muscle spasms.      . DULoxetine (CYMBALTA) 60 MG capsule Take 60 mg by mouth every morning.       Marland Kitchen Epoprostenol Sodium (FLOLAN IV) Inject into the vein. 1.5  mg. Inject into the vein continuously. Infuse mg/kg/min via cont IV pump.      Marland Kitchen ezetimibe-simvastatin (VYTORIN) 10-40 MG per tablet Take 1 tablet by mouth at bedtime.      . furosemide (LASIX) 40 MG tablet Take 80 mg by mouth every morning. 80 mg in the morning and 40 mg in the afternoon depending on weight      . HYDROmorphone (DILAUDID) 4 MG tablet Take 4 mg by mouth every 4 (four) hours as needed. For pain      . insulin glargine (LANTUS) 100 UNIT/ML injection Inject 20 Units into the skin every morning.       . isosorbide mononitrate (IMDUR) 60 MG 24 hr tablet Take 90 mg by mouth every morning.       Marland Kitchen levothyroxine (SYNTHROID, LEVOTHROID) 25 MCG tablet Take 25 mcg by mouth every morning.       .  metoprolol (TOPROL-XL) 100 MG 24 hr tablet Take 100 mg by mouth every morning.       . naproxen sodium (ALEVE) 220 MG tablet Take 440 mg by mouth daily as needed. *Takes only as needed when other pain medications are not used*      . nitroGLYCERIN (NITROSTAT) 0.4 MG SL tablet Place 0.4 mg under the tongue every 5 (five) minutes as needed for chest pain.       Marland Kitchen omeprazole (PRILOSEC) 20 MG capsule Take 20 mg by mouth 2 (two) times daily.        . ondansetron (ZOFRAN) 8 MG tablet Take 8 mg by mouth every 8 (eight) hours as needed. For nausea       . Oxygen Permeable Lens Products SOLN Inhale into the lungs continuous. 6 liters      . oxymetazoline (NASAL SPRAY 12 HOUR) 0.05 % nasal spray Place 2 sprays into the nose daily as needed. For nasal congestion      . potassium chloride SA (K-DUR,KLOR-CON) 20 MEQ tablet Take 40 mEq by mouth daily.       . silver sulfADIAZINE (SILVADENE) 1 % cream Apply 1 application topically daily. Apply as needed to areas of breakdown on buttocks.      Marland Kitchen spironolactone (ALDACTONE) 25 MG tablet Take 25 mg by mouth daily. *Taken  6 hours after Lasix 80 mg      . traMADol (ULTRAM) 50 MG tablet Take 50 mg by mouth every 3 (three) hours as needed. For pain. *takes with Dilaudid (Hydromorphone)      . zolpidem (AMBIEN) 5 MG tablet Take 5 mg by mouth at bedtime as needed for sleep.      . [DISCONTINUED] cetirizine (ZYRTEC) 10 MG tablet Take 10 mg by mouth daily.         No current facility-administered medications for this encounter.    Filed Vitals:   05/25/13 1420  BP: 118/50  Pulse: 64  Weight: 144 lb 12.8 oz (65.681 kg)  SpO2: 96%   PHYSICAL EXAM: General: No distress. Wearing 6L [place] O2 husband and sister present  HEENT: normal  Neck: thick . no JVD. Carotids 2+ bilat; no bruits. No lymphadenopathy or thryomegaly appreciated.  Cor: PMI nonpalpable Regular rate & rhythm. No rubs, gallops or murmurs. P2 perhaps mildly accentuated but not severely so  Lungs: clear but  decreased throughout no wheezing on 6 liters   Abdomen: obese. soft, nontender, nondistended. No hepatosplenomegaly. No bruits or masses. Good bowel sounds.  Extremities: no cyanosis, clubbing, macular-papular erythematous rash on LEs, edema  Neuro: alert &  oriented x 3, cranial nerves grossly intact. moves all 4 extremities w/o difficulty. Affect pleasant.    ASSESSMENT:   1. PAH on Flolan through [facility] 2. CAD s/p CABG and stents 3. Chronic diastolic HF 4. DM2  PLAN/DISCUSSION:  She is much improved on Flolan - this is the best I have seen her in a long time. Having occasional chest and jaw pain but I don't think this is clearly ischemic. Will continue current regimen. She is to contact me if she develops worsening CP.    Truman Hayward 10:38 PM

## 2013-06-26 ENCOUNTER — Other Ambulatory Visit: Payer: Self-pay | Admitting: Internal Medicine

## 2013-06-30 ENCOUNTER — Ambulatory Visit (HOSPITAL_COMMUNITY)
Admission: RE | Admit: 2013-06-30 | Discharge: 2013-06-30 | Disposition: A | Discharge status: Home / Self Care | Payer: Medicare Other | Source: Ambulatory Visit | Attending: Internal Medicine | Admitting: Internal Medicine

## 2013-06-30 DIAGNOSIS — I379 Nonrheumatic pulmonary valve disorder, unspecified: Secondary | ICD-10-CM

## 2013-06-30 DIAGNOSIS — I2789 Other specified pulmonary heart diseases: Secondary | ICD-10-CM

## 2013-06-30 DIAGNOSIS — I079 Rheumatic tricuspid valve disease, unspecified: Secondary | ICD-10-CM | POA: Insufficient documentation

## 2013-06-30 DIAGNOSIS — I5033 Acute on chronic diastolic (congestive) heart failure: Secondary | ICD-10-CM

## 2013-06-30 DIAGNOSIS — I279 Pulmonary heart disease, unspecified: Secondary | ICD-10-CM | POA: Insufficient documentation

## 2013-06-30 DIAGNOSIS — I080 Rheumatic disorders of both mitral and aortic valves: Secondary | ICD-10-CM | POA: Insufficient documentation

## 2013-06-30 MED ORDER — PERFLUTREN LIPID MICROSPHERE
1.0000 mL | INTRAVENOUS | Status: AC | PRN
  Administered 2013-06-30: 2 mL via INTRAVENOUS
  Filled 2013-06-30: qty 10

## 2013-06-30 NOTE — Progress Notes (Signed)
  Echocardiogram 2D Echocardiogram has been performed.  Georgian Co 06/30/2013, 2:31 PM

## 2013-10-06 ENCOUNTER — Ambulatory Visit (HOSPITAL_COMMUNITY)
Admission: RE | Admit: 2013-10-06 | Discharge: 2013-10-06 | Disposition: A | Discharge status: Home / Self Care | Payer: Medicare Other | Source: Ambulatory Visit | Attending: Internal Medicine | Admitting: Internal Medicine

## 2013-10-06 VITALS — BP 106/52 | HR 83 | Wt 151.8 lb

## 2013-10-06 DIAGNOSIS — I5033 Acute on chronic diastolic (congestive) heart failure: Secondary | ICD-10-CM | POA: Insufficient documentation

## 2013-10-06 DIAGNOSIS — R0600 Dyspnea, unspecified: Secondary | ICD-10-CM

## 2013-10-06 DIAGNOSIS — R079 Chest pain, unspecified: Secondary | ICD-10-CM | POA: Insufficient documentation

## 2013-10-06 DIAGNOSIS — I2510 Atherosclerotic heart disease of native coronary artery without angina pectoris: Secondary | ICD-10-CM

## 2013-10-06 DIAGNOSIS — R0609 Other forms of dyspnea: Secondary | ICD-10-CM | POA: Insufficient documentation

## 2013-10-06 DIAGNOSIS — R0989 Other specified symptoms and signs involving the circulatory and respiratory systems: Secondary | ICD-10-CM | POA: Insufficient documentation

## 2013-10-06 DIAGNOSIS — R002 Palpitations: Secondary | ICD-10-CM

## 2013-10-06 NOTE — Progress Notes (Signed)
Patient ID: Meagan [name], female   DOB: 01-Jan-1946, 67 y.o.   MRN: [id number] GI: Dr Jena Gauss Oncologist: Dr Mariel Sleet Cardiologsit: Dr Monia Pouch- [facility] IV Flolan  HPI: [name] is a 67 y/o woman with multiple medical problems including CAD s/p CABG and RCA stent, DM2, obesity, fibromyalgia, diastolic HF. She has severe PAH and is on Flolan followed by Dr. Monia Pouch at [facility].   CT chest 10/10: No PE or ILD PFTs 10/10: Restrictive lung disease FEV1 1.01L (56%) FVC 1.2 (48%) DLCO 24% Rheum work-up which was negative. Sleep study 11/10: negative for OSA  RHC/LHC 01/21/13  RA = 11  RV = 82/0/15  PA = 78/18 (39)  PCW = 19  Fick cardiac output/index = 6.1/3.6  PVR = 3.3 Woods  FA sat = 93%  PA sat = 62%,64%  Ao Pressure: 106/39 (65)  LV Pressure: 124/5/16 Left main: Heavily calcified. Distal 40-50%  LAD: Diffuse severe disease. Totalled in midsection. Mid to distal vessel fills from LIMA. 2 diagonals with diffuse disease. There is a 70-80% lesion in the midsection of the 2nd diagonal  LCX: Small heavily diseased vessel. 30-40% ostial. Long 95% lesion in mid AV groove after take-off of OM-1. Distal vessel small and diffusely diseased. OM-1 mild plaque. OM-2 and OM-3 occluded  RCA: Ostial spasm. Long stent in midsection is patent. Distal RCA 40% before PDA. Mild to moderate plaque in PD and PLs.  LV-gram done in the RAO projection: Ejection fraction = 60-65% No wall motion abnormalities  LIMA-LAD: widely patent with 50-60% lesion in LAD after insertion of LIMA  SVG-OM: totally occluded proximally (chronic)   Echo 06/30/13: EF 60% RV moderately dilated. Evidence cor pulmonale RVSP 54  Follow up: Here for acute visit due to worsening SOB. Saw Dr. Monia Pouch on 09/08/2013. At that time felt she was doing better. Weight 146.  was 247m which was her best. pBNP was 925 which was also her lowest value. Did not add PDE-5 inhibitor due to NTG use and no ERA due to volume issues. Started feeling worse for  about 1.5 weeks. Says that when she starts walking gets pain in her jaw which is getting worse. Takes NTG and it goes away. Took 7-8 NTG 2 days ago. Also marked DOE. Weight up 8 pounds but denies edema. Occasional wheezing. Not following a good diet.    ROS: All systems negative except as listed in HPI, PMH and Problem List.  Past Medical History  Diagnosis Date  . Fibromyalgia   . Diabetes mellitus   . Hypercholesterolemia   . Hypertension     pulmonary  pap 110/31 (mean 62)by RHC 9/10 negative PE by chest CT 2010  . Pulmonary edema     Failed Revatio   . ARF (acute renal failure)     poor candidate for ACE -I or ARB  . Restrictive lung disease     obesity hypoventilation syndrome  . Renal insufficiency   . CAD (coronary artery disease)     multivessel DES x 2 RCA 2007  . Chronic diastolic heart failure   . MRSA (methicillin resistant staphylococcus aureus) pneumonia   . Pulmonary hypertension   . Diastolic heart failure   . Chronic anemia     anemia of chronic disease based on anemia panel 2011  . Pancreatic mass 08/01/2012    Current Outpatient Prescriptions  Medication Sig Dispense Refill  . ALPRAZolam (XANAX) 0.5 MG tablet Take 0.5 mg by mouth 2 (two) times daily as needed. For sleep. *  Prescribed one tablet three times daily as needed*      . clopidogrel (PLAVIX) 75 MG tablet Take 75 mg by mouth every morning.       . cyclobenzaprine (FLEXERIL) 10 MG tablet Take 10 mg by mouth 2 (two) times daily as needed for muscle spasms.      . DULoxetine (CYMBALTA) 60 MG capsule Take 60 mg by mouth every morning.       Marland Kitchen Epoprostenol Sodium (FLOLAN IV) Inject into the vein. 1.5 mg. Inject into the vein continuously. Infuse mg/kg/min via cont IV pump.      . furosemide (LASIX) 40 MG tablet Take 80 mg by mouth daily.       Marland Kitchen HYDROmorphone (DILAUDID) 4 MG tablet Take 4 mg by mouth every 4 (four) hours as needed. For pain      . insulin glargine (LANTUS) 100 UNIT/ML injection Inject 26  Units into the skin every morning.       . isosorbide mononitrate (IMDUR) 60 MG 24 hr tablet Take 90 mg by mouth every morning.       Marland Kitchen levothyroxine (SYNTHROID, LEVOTHROID) 25 MCG tablet Take 25 mcg by mouth every morning.       . metoprolol (TOPROL-XL) 100 MG 24 hr tablet Take 100 mg by mouth every morning.       . naproxen sodium (ALEVE) 220 MG tablet Take 440 mg by mouth daily as needed. *Takes only as needed when other pain medications are not used*      . nitroGLYCERIN (NITROSTAT) 0.4 MG SL tablet Place 0.4 mg under the tongue every 5 (five) minutes as needed for chest pain.       Marland Kitchen omeprazole (PRILOSEC) 20 MG capsule Take 20 mg by mouth 2 (two) times daily.        . ondansetron (ZOFRAN) 8 MG tablet Take 8 mg by mouth every 8 (eight) hours as needed. For nausea       . Oxygen Permeable Lens Products SOLN Inhale into the lungs continuous. 6 liters      . oxymetazoline (NASAL SPRAY 12 HOUR) 0.05 % nasal spray Place 2 sprays into the nose daily as needed. For nasal congestion      . potassium chloride SA (K-DUR,KLOR-CON) 20 MEQ tablet Take 40 mEq by mouth daily.       . silver sulfADIAZINE (SILVADENE) 1 % cream Apply 1 application topically daily. Apply as needed to areas of breakdown on buttocks.      . traMADol (ULTRAM) 50 MG tablet Take 50 mg by mouth every 3 (three) hours as needed. For pain. *takes with Dilaudid (Hydromorphone)      . VYTORIN 10-40 MG per tablet TAKE ONE TABLET AT BEDTIME  90 tablet  3  . [DISCONTINUED] cetirizine (ZYRTEC) 10 MG tablet Take 10 mg by mouth daily.         No current facility-administered medications for this encounter.    Filed Vitals:   10/06/13 1046  BP: 106/52  Pulse: 83  Weight: 151 lb 12 oz (68.833 kg)  SpO2: 92%   PHYSICAL EXAM: General: No distress. Wearing 6L [place] O2 husband and sister present  HEENT: normal  Neck: thick . JVP 9-10 Carotids 2+ bilat; no bruits. No lymphadenopathy or thryomegaly appreciated.  Cor: PMI nonpalpable Regular rate  & rhythm. No rubs, gallops or murmurs. P2 perhaps mildly accentuated but not severely so  Lungs: clear but decreased throughout no wheezing on 6 liters   Abdomen: obese. soft, nontender, nondistended.  No hepatosplenomegaly. No bruits or masses. Good bowel sounds.  Extremities: no cyanosis, clubbing, macular-papular erythematous rash on LEs, no edema  Neuro: alert & oriented x 3, cranial nerves grossly intact. moves all 4 extremities w/o difficulty. Affect pleasant.    ASSESSMENT:   1. PAH on Flolan through [facility] 2. Worsening dyspnea 3. Progressive angina 4. CAD s/p CABG and stents 5. Chronic diastolic HF 6. DM2  PLAN/DISCUSSION:  Unclear to me if her worsening CP and dyspnea is related to volume overload or progressive coronary disease. We reviewed her cath from April of this year and I am doubtful that thing have changed too much over the past 8 months, On exam, she does have some extra volume, we will increase lasix to 80 bid for 2 days. (with extra potassium). If symptoms no better will then plan R and L heart cath either Friday or Monday. If CP continues to progress can call 911. Be more careful with diet.  Amyla Heffner,MD 11:37 AM

## 2013-10-06 NOTE — Patient Instructions (Signed)
Increase Furosemide (Lasix) to 80 mg Twice daily for 2 days  Call us on Thursday to let us know how you are feeling  We will contact you in 3 months to schedule your next appointment.

## 2013-10-06 NOTE — Addendum Note (Signed)
Encounter addended by: Noralee Space, RN on: 10/06/2013 12:05 PM<BR>     Documentation filed: Visit Diagnoses, Patient Instructions Section, Orders

## 2013-10-12 ENCOUNTER — Encounter (HOSPITAL_COMMUNITY): Payer: Medicare Other

## 2013-11-19 ENCOUNTER — Other Ambulatory Visit (HOSPITAL_COMMUNITY): Payer: Self-pay | Admitting: Family Medicine

## 2013-11-19 ENCOUNTER — Ambulatory Visit (HOSPITAL_COMMUNITY)
Admission: RE | Admit: 2013-11-19 | Discharge: 2013-11-19 | Disposition: A | Discharge status: Home / Self Care | Payer: Medicare Other | Source: Ambulatory Visit | Attending: Family Medicine | Admitting: Family Medicine

## 2013-11-19 DIAGNOSIS — M25519 Pain in unspecified shoulder: Secondary | ICD-10-CM

## 2013-11-19 DIAGNOSIS — M79609 Pain in unspecified limb: Secondary | ICD-10-CM | POA: Insufficient documentation

## 2014-01-06 ENCOUNTER — Encounter (HOSPITAL_COMMUNITY): Payer: Self-pay

## 2014-01-06 ENCOUNTER — Ambulatory Visit (HOSPITAL_COMMUNITY)
Admission: RE | Admit: 2014-01-06 | Discharge: 2014-01-06 | Disposition: A | Discharge status: Home / Self Care | Payer: Medicare Other | Source: Ambulatory Visit | Attending: Internal Medicine | Admitting: Internal Medicine

## 2014-01-06 VITALS — BP 110/52 | HR 75 | Wt 152.8 lb

## 2014-01-06 DIAGNOSIS — I2789 Other specified pulmonary heart diseases: Secondary | ICD-10-CM | POA: Insufficient documentation

## 2014-01-06 DIAGNOSIS — I2510 Atherosclerotic heart disease of native coronary artery without angina pectoris: Secondary | ICD-10-CM | POA: Insufficient documentation

## 2014-01-06 DIAGNOSIS — I5032 Chronic diastolic (congestive) heart failure: Secondary | ICD-10-CM | POA: Insufficient documentation

## 2014-01-06 NOTE — Progress Notes (Signed)
Patient ID: Meagan [name], female   DOB: Sep 09, 1946, 68 y.o.   MRN: [id number] GI: Dr Gala Romney Oncologist: Dr Tressie Stalker Cardiologsit: Dr Gilles Chiquito- [facility] IV Flolan  HPI: Meagan [name] is a 68 y/o woman with multiple medical problems including CAD s/p CABG and RCA stent, DM2, obesity, fibromyalgia, diastolic HF. She has severe PAH and is on Flolan followed by Dr. Gilles Chiquito at [facility].   CT chest 10/10: No PE or ILD PFTs 10/10: Restrictive lung disease FEV1 1.01L (56%) FVC 1.2 (48%) DLCO 24% Rheum work-up which was negative. Sleep study 11/10: negative for OSA  RHC/LHC 01/21/13  RA = 11  RV = 82/0/15  PA = 78/18 (39)  PCW = 19  Fick cardiac output/index = 6.1/3.6  PVR = 3.3 Woods  FA sat = 93%  PA sat = 62%,64%  Ao Pressure: 106/39 (65)  LV Pressure: 124/5/16 Left main: Heavily calcified. Distal 40-50%  LAD: Diffuse severe disease. Totalled in midsection. Mid to distal vessel fills from LIMA. 2 diagonals with diffuse disease. There is a 70-80% lesion in the midsection of the 2nd diagonal  LCX: Small heavily diseased vessel. 30-40% ostial. Long 95% lesion in mid AV groove after take-off of OM-1. Distal vessel small and diffusely diseased. OM-1 mild plaque. OM-2 and OM-3 occluded  RCA: Ostial spasm. Long stent in midsection is patent. Distal RCA 40% before PDA. Mild to moderate plaque in PD and PLs.  LV-gram done in the RAO projection: Ejection fraction = 60-65% No wall motion abnormalities  LIMA-LAD: widely patent with 50-60% lesion in LAD after insertion of LIMA  SVG-OM: totally occluded proximally (chronic)   Echo 06/30/13: EF 60% RV moderately dilated. Evidence cor pulmonale RVSP 54  Follow up: Says overall she is doing well. Last week had some volume overload and took extra lasix which worked well. Remains NYHA 3. No edema currently. No CP. Breathing better after diuresis. No syncope or presyncope. Mild jaw claudication at beginning of eating.    ROS: All systems negative except as listed in HPI,  PMH and Problem List.  Past Medical History  Diagnosis Date  . Fibromyalgia   . Diabetes mellitus   . Hypercholesterolemia   . Hypertension     pulmonary  pap 110/31 (mean 62)by RHC 9/10 negative PE by chest CT 2010  . Pulmonary edema     Failed Revatio   . ARF (acute renal failure)     poor candidate for ACE -I or ARB  . Restrictive lung disease     obesity hypoventilation syndrome  . Renal insufficiency   . CAD (coronary artery disease)     multivessel DES x 2 RCA 2007  . Chronic diastolic heart failure   . MRSA (methicillin resistant staphylococcus aureus) pneumonia   . Pulmonary hypertension   . Diastolic heart failure   . Chronic anemia     anemia of chronic disease based on anemia panel 2011  . Pancreatic mass 08/01/2012    Current Outpatient Prescriptions  Medication Sig Dispense Refill  . ALPRAZolam (XANAX) 0.5 MG tablet Take 0.5 mg by mouth 2 (two) times daily as needed. For sleep. *Prescribed one tablet three times daily as needed*      . clopidogrel (PLAVIX) 75 MG tablet Take 75 mg by mouth every morning.       . cyclobenzaprine (FLEXERIL) 10 MG tablet Take 10 mg by mouth 2 (two) times daily as needed for muscle spasms.      . DULoxetine (CYMBALTA) 60 MG capsule Take 60  mg by mouth every morning.       Marland Kitchen Epoprostenol Sodium (FLOLAN IV) Inject into the vein. 1.5 mg. Inject into the vein continuously. Infuse mg/kg/min via cont IV pump.      . furosemide (LASIX) 40 MG tablet Take 80 mg by mouth daily.       Marland Kitchen HYDROmorphone (DILAUDID) 4 MG tablet Take 4 mg by mouth every 4 (four) hours as needed. For pain      . insulin glargine (LANTUS) 100 UNIT/ML injection Inject 26 Units into the skin every morning.       . isosorbide mononitrate (IMDUR) 60 MG 24 hr tablet Take 90 mg by mouth every morning.       Marland Kitchen levothyroxine (SYNTHROID, LEVOTHROID) 25 MCG tablet Take 25 mcg by mouth every morning.       . metoprolol (TOPROL-XL) 100 MG 24 hr tablet Take 100 mg by mouth every  morning.       . naproxen sodium (ALEVE) 220 MG tablet Take 440 mg by mouth daily as needed. *Takes only as needed when other pain medications are not used*      . nitroGLYCERIN (NITROSTAT) 0.4 MG SL tablet Place 0.4 mg under the tongue every 5 (five) minutes as needed for chest pain.       Marland Kitchen omeprazole (PRILOSEC) 20 MG capsule Take 20 mg by mouth 2 (two) times daily.        . ondansetron (ZOFRAN) 8 MG tablet Take 8 mg by mouth every 8 (eight) hours as needed. For nausea       . Oxygen Permeable Lens Products SOLN Inhale into the lungs continuous. 6 liters      . oxymetazoline (NASAL SPRAY 12 HOUR) 0.05 % nasal spray Place 2 sprays into the nose daily as needed. For nasal congestion      . potassium chloride SA (K-DUR,KLOR-CON) 20 MEQ tablet Take 40 mEq by mouth daily.       . silver sulfADIAZINE (SILVADENE) 1 % cream Apply 1 application topically daily. Apply as needed to areas of breakdown on buttocks.      . traMADol (ULTRAM) 50 MG tablet Take 50 mg by mouth every 3 (three) hours as needed. For pain. *takes with Dilaudid (Hydromorphone)      . VYTORIN 10-40 MG per tablet TAKE ONE TABLET AT BEDTIME  90 tablet  3  . [DISCONTINUED] cetirizine (ZYRTEC) 10 MG tablet Take 10 mg by mouth daily.         No current facility-administered medications for this encounter.    Filed Vitals:   01/06/14 1446  BP: 110/52  Pulse: 75  Weight: 152 lb 12.8 oz (69.31 kg)  SpO2: 96%   PHYSICAL EXAM: General: No distress. Wearing 6L [place] O2 husband and sister present  HEENT: normal  Neck: thick . JVP 6-7 Carotids 2+ bilat; no bruits. No lymphadenopathy or thryomegaly appreciated.  Cor: PMI nonpalpable Regular rate & rhythm. 2/6 TR. P2 perhaps mildly accentuated but not severely so  Hickman catheter ok Lungs: clear but decreased throughout no wheezing on 6 liters [place]  Abdomen: obese. soft, nontender, nondistended. No hepatosplenomegaly. No bruits or masses. Good bowel sounds.  Extremities: no cyanosis,  clubbing, macular-papular erythematous rash on LEs, no edema  Neuro: alert & oriented x 3, cranial nerves grossly intact. moves all 4 extremities w/o difficulty. Affect pleasant.    ASSESSMENT:   1. [place] on Flolan through [facility] 2. CAD s/p CABG and stents 3. Chronic diastolic HF 4. DM2  PLAN/DISCUSSION:  Doing quite well. Volume status looks good. Congratulated on use of sliding scale diuretics. CAD stable without any evidence of angina. Continue current regimen. F/u with [facility] in April. We will see her in 6 months.   Daniel Bensimhon,MD 3:18 PM

## 2014-01-06 NOTE — Addendum Note (Signed)
Encounter addended by: Scarlette Calico, RN on: 01/06/2014  3:23 PM<BR>     Documentation filed: Patient Instructions Section

## 2014-01-06 NOTE — Patient Instructions (Signed)
We will contact you in 6 months to schedule your next appointment.  

## 2014-02-23 ENCOUNTER — Inpatient Hospital Stay (HOSPITAL_COMMUNITY)
Admission: EM | Admit: 2014-02-23 | Discharge: 2014-02-25 | DRG: 286 | Disposition: A | Discharge status: Home / Self Care | Payer: Medicare Other | Attending: Internal Medicine | Admitting: Internal Medicine

## 2014-02-23 ENCOUNTER — Encounter (HOSPITAL_COMMUNITY): Payer: Self-pay | Admitting: Emergency Medicine

## 2014-02-23 ENCOUNTER — Inpatient Hospital Stay (HOSPITAL_COMMUNITY): Admission: AD | Admit: 2014-02-23 | Payer: Medicare Other | Source: Ambulatory Visit | Admitting: Internal Medicine

## 2014-02-23 ENCOUNTER — Emergency Department (HOSPITAL_COMMUNITY): Payer: Medicare Other

## 2014-02-23 DIAGNOSIS — I272 Pulmonary hypertension, unspecified: Secondary | ICD-10-CM

## 2014-02-23 DIAGNOSIS — K219 Gastro-esophageal reflux disease without esophagitis: Secondary | ICD-10-CM

## 2014-02-23 DIAGNOSIS — F411 Generalized anxiety disorder: Secondary | ICD-10-CM | POA: Diagnosis present

## 2014-02-23 DIAGNOSIS — J9600 Acute respiratory failure, unspecified whether with hypoxia or hypercapnia: Secondary | ICD-10-CM

## 2014-02-23 DIAGNOSIS — D638 Anemia in other chronic diseases classified elsewhere: Secondary | ICD-10-CM | POA: Diagnosis present

## 2014-02-23 DIAGNOSIS — I10 Essential (primary) hypertension: Secondary | ICD-10-CM | POA: Diagnosis present

## 2014-02-23 DIAGNOSIS — J9620 Acute and chronic respiratory failure, unspecified whether with hypoxia or hypercapnia: Secondary | ICD-10-CM | POA: Diagnosis present

## 2014-02-23 DIAGNOSIS — E119 Type 2 diabetes mellitus without complications: Secondary | ICD-10-CM | POA: Diagnosis present

## 2014-02-23 DIAGNOSIS — Z7901 Long term (current) use of anticoagulants: Secondary | ICD-10-CM

## 2014-02-23 DIAGNOSIS — E7800 Pure hypercholesterolemia, unspecified: Secondary | ICD-10-CM | POA: Diagnosis present

## 2014-02-23 DIAGNOSIS — R110 Nausea: Secondary | ICD-10-CM | POA: Diagnosis present

## 2014-02-23 DIAGNOSIS — I2510 Atherosclerotic heart disease of native coronary artery without angina pectoris: Secondary | ICD-10-CM

## 2014-02-23 DIAGNOSIS — I509 Heart failure, unspecified: Secondary | ICD-10-CM | POA: Diagnosis present

## 2014-02-23 DIAGNOSIS — Z8614 Personal history of Methicillin resistant Staphylococcus aureus infection: Secondary | ICD-10-CM

## 2014-02-23 DIAGNOSIS — R079 Chest pain, unspecified: Secondary | ICD-10-CM

## 2014-02-23 DIAGNOSIS — J189 Pneumonia, unspecified organism: Secondary | ICD-10-CM | POA: Diagnosis present

## 2014-02-23 DIAGNOSIS — IMO0001 Reserved for inherently not codable concepts without codable children: Secondary | ICD-10-CM | POA: Diagnosis present

## 2014-02-23 DIAGNOSIS — E662 Morbid (severe) obesity with alveolar hypoventilation: Secondary | ICD-10-CM | POA: Diagnosis present

## 2014-02-23 DIAGNOSIS — I2721 Secondary pulmonary arterial hypertension: Secondary | ICD-10-CM | POA: Diagnosis present

## 2014-02-23 DIAGNOSIS — R6884 Jaw pain: Secondary | ICD-10-CM | POA: Diagnosis present

## 2014-02-23 DIAGNOSIS — I2789 Other specified pulmonary heart diseases: Secondary | ICD-10-CM | POA: Diagnosis present

## 2014-02-23 DIAGNOSIS — Z794 Long term (current) use of insulin: Secondary | ICD-10-CM

## 2014-02-23 DIAGNOSIS — E669 Obesity, unspecified: Secondary | ICD-10-CM | POA: Diagnosis present

## 2014-02-23 DIAGNOSIS — I5033 Acute on chronic diastolic (congestive) heart failure: Principal | ICD-10-CM | POA: Diagnosis present

## 2014-02-23 DIAGNOSIS — Z951 Presence of aortocoronary bypass graft: Secondary | ICD-10-CM

## 2014-02-23 DIAGNOSIS — E785 Hyperlipidemia, unspecified: Secondary | ICD-10-CM | POA: Diagnosis present

## 2014-02-23 DIAGNOSIS — Y92009 Unspecified place in unspecified non-institutional (private) residence as the place of occurrence of the external cause: Secondary | ICD-10-CM

## 2014-02-23 DIAGNOSIS — Z9861 Coronary angioplasty status: Secondary | ICD-10-CM

## 2014-02-23 DIAGNOSIS — IMO0002 Reserved for concepts with insufficient information to code with codable children: Secondary | ICD-10-CM | POA: Diagnosis present

## 2014-02-23 DIAGNOSIS — E1165 Type 2 diabetes mellitus with hyperglycemia: Secondary | ICD-10-CM | POA: Diagnosis present

## 2014-02-23 DIAGNOSIS — Z9981 Dependence on supplemental oxygen: Secondary | ICD-10-CM

## 2014-02-23 HISTORY — DX: Anxiety disorder, unspecified: F41.9

## 2014-02-23 HISTORY — DX: Hypothyroidism, unspecified: E03.9

## 2014-02-23 HISTORY — DX: Heart failure, unspecified: I50.9

## 2014-02-23 HISTORY — DX: Angina pectoris, unspecified: I20.9

## 2014-02-23 HISTORY — DX: Dependence on supplemental oxygen: Z99.81

## 2014-02-23 HISTORY — DX: Pneumonia, unspecified organism: J18.9

## 2014-02-23 HISTORY — DX: Gastro-esophageal reflux disease without esophagitis: K21.9

## 2014-02-23 HISTORY — DX: Other specified postprocedural states: Z98.890

## 2014-02-23 HISTORY — DX: Cardiac murmur, unspecified: R01.1

## 2014-02-23 HISTORY — DX: Other specified postprocedural states: R11.2

## 2014-02-23 HISTORY — DX: Personal history of other diseases of the digestive system: Z87.19

## 2014-02-23 HISTORY — DX: Type 2 diabetes mellitus without complications: E11.9

## 2014-02-23 LAB — COMPREHENSIVE METABOLIC PANEL
ALT: 10 U/L (ref 0–35)
AST: 10 U/L (ref 0–37)
Albumin: 3.2 g/dL — ABNORMAL LOW (ref 3.5–5.2)
Alkaline Phosphatase: 141 U/L — ABNORMAL HIGH (ref 39–117)
BUN: 14 mg/dL (ref 6–23)
CALCIUM: 9.2 mg/dL (ref 8.4–10.5)
CO2: 32 mEq/L (ref 19–32)
Chloride: 96 mEq/L (ref 96–112)
Creatinine, Ser: 0.98 mg/dL (ref 0.50–1.10)
GFR calc non Af Amer: 58 mL/min — ABNORMAL LOW (ref >90)
GFR, EST AFRICAN AMERICAN: 67 mL/min — AB (ref >90)
Glucose, Bld: 236 mg/dL — ABNORMAL HIGH (ref 70–99)
Potassium: 4.1 mEq/L (ref 3.7–5.3)
Sodium: 140 mEq/L (ref 137–147)
Total Bilirubin: 0.2 mg/dL — ABNORMAL LOW (ref 0.3–1.2)
Total Protein: 6.6 g/dL (ref 6.0–8.3)

## 2014-02-23 LAB — I-STAT TROPONIN, ED: Troponin i, poc: 0.01 ng/mL (ref 0.00–0.08)

## 2014-02-23 LAB — CBC
HCT: 31.7 % — ABNORMAL LOW (ref 36.0–46.0)
HCT: 30 % — ABNORMAL LOW (ref 36.0–46.0)
HEMOGLOBIN: 9.9 g/dL — AB (ref 12.0–15.0)
Hemoglobin: 10.3 g/dL — ABNORMAL LOW (ref 12.0–15.0)
MCH: 29.3 pg (ref 26.0–34.0)
MCH: 29.6 pg (ref 26.0–34.0)
MCHC: 32.5 g/dL (ref 30.0–36.0)
MCHC: 33 g/dL (ref 30.0–36.0)
MCV: 90.1 fL (ref 78.0–100.0)
MCV: 89.6 fL (ref 78.0–100.0)
Platelets: 285 10*3/uL (ref 150–400)
Platelets: 293 10*3/uL (ref 150–400)
RBC: 3.52 MIL/uL — ABNORMAL LOW (ref 3.87–5.11)
RBC: 3.35 MIL/uL — AB (ref 3.87–5.11)
RDW: 12.8 % (ref 11.5–15.5)
RDW: 12.7 % (ref 11.5–15.5)
WBC: 12.6 10*3/uL — ABNORMAL HIGH (ref 4.0–10.5)
WBC: 14.8 10*3/uL — ABNORMAL HIGH (ref 4.0–10.5)

## 2014-02-23 LAB — PROTIME-INR
INR: 0.91 (ref 0.00–1.49)
PROTHROMBIN TIME: 12.1 s (ref 11.6–15.2)

## 2014-02-23 LAB — BASIC METABOLIC PANEL
BUN: 13 mg/dL (ref 6–23)
CHLORIDE: 95 meq/L — AB (ref 96–112)
CO2: 31 meq/L (ref 19–32)
CREATININE: 0.85 mg/dL (ref 0.50–1.10)
Calcium: 9.1 mg/dL (ref 8.4–10.5)
GFR calc Af Amer: 79 mL/min — ABNORMAL LOW (ref >90)
GFR calc non Af Amer: 68 mL/min — ABNORMAL LOW (ref >90)
GLUCOSE: 168 mg/dL — AB (ref 70–99)
Potassium: 4.3 mEq/L (ref 3.7–5.3)
Sodium: 138 mEq/L (ref 137–147)

## 2014-02-23 LAB — MRSA PCR SCREENING: MRSA by PCR: NEGATIVE

## 2014-02-23 LAB — GLUCOSE, CAPILLARY
Glucose-Capillary: 257 mg/dL — ABNORMAL HIGH (ref 70–99)
Glucose-Capillary: 122 mg/dL — ABNORMAL HIGH (ref 70–99)

## 2014-02-23 LAB — PRO B NATRIURETIC PEPTIDE: Pro B Natriuretic peptide (BNP): 803.2 pg/mL — ABNORMAL HIGH (ref 0–125)

## 2014-02-23 LAB — MAGNESIUM: Magnesium: 2 mg/dL (ref 1.5–2.5)

## 2014-02-23 MED ORDER — ONDANSETRON HCL 4 MG PO TABS: 4.0000 mg | ORAL_TABLET | Freq: Four times a day (QID) | ORAL | Status: DC | PRN

## 2014-02-23 MED ORDER — EZETIMIBE-SIMVASTATIN 10-40 MG PO TABS
1.0000 | ORAL_TABLET | Freq: Every day | ORAL | Status: DC
  Administered 2014-02-23 – 2014-02-24 (×2): 1 via ORAL
  Filled 2014-02-23 (×3): qty 1

## 2014-02-23 MED ORDER — ISOSORBIDE MONONITRATE ER 60 MG PO TB24
90.0000 mg | ORAL_TABLET | Freq: Every morning | ORAL | Status: DC
  Administered 2014-02-23 – 2014-02-25 (×3): 90 mg via ORAL
  Filled 2014-02-23 (×3): qty 1

## 2014-02-23 MED ORDER — PANTOPRAZOLE SODIUM 40 MG PO TBEC
40.0000 mg | DELAYED_RELEASE_TABLET | Freq: Every day | ORAL | Status: DC
  Administered 2014-02-23 – 2014-02-24 (×2): 40 mg via ORAL
  Filled 2014-02-23 (×2): qty 1

## 2014-02-23 MED ORDER — DULOXETINE HCL 60 MG PO CPEP
60.0000 mg | ORAL_CAPSULE | Freq: Every day | ORAL | Status: DC
  Administered 2014-02-23 – 2014-02-25 (×3): 60 mg via ORAL
  Filled 2014-02-23 (×3): qty 1

## 2014-02-23 MED ORDER — SODIUM CHLORIDE 0.9 % IV SOLN: 250.0000 mL | INTRAVENOUS | Status: DC | PRN

## 2014-02-23 MED ORDER — SODIUM CHLORIDE 0.9 % IJ SOLN: 3.0000 mL | INTRAMUSCULAR | Status: DC | PRN

## 2014-02-23 MED ORDER — DEXTROSE 5 % IV SOLN: 500.0000 mg | Freq: Once | INTRAVENOUS | Status: AC

## 2014-02-23 MED ORDER — SODIUM CHLORIDE 0.9 % IV SOLN
INTRAVENOUS | Status: AC
  Administered 2014-02-23: 14:00:00 via INTRAVENOUS

## 2014-02-23 MED ORDER — FUROSEMIDE 10 MG/ML IJ SOLN
80.0000 mg | Freq: Once | INTRAMUSCULAR | Status: AC
  Administered 2014-02-23: 80 mg via INTRAVENOUS
  Filled 2014-02-23: qty 8

## 2014-02-23 MED ORDER — PH 12 STERILE DILUENT
38.3100 ng/kg/min | INTRAVENOUS | Status: DC
  Filled 2014-02-23: qty 15

## 2014-02-23 MED ORDER — CLOPIDOGREL BISULFATE 75 MG PO TABS
75.0000 mg | ORAL_TABLET | Freq: Every day | ORAL | Status: DC
  Administered 2014-02-23 – 2014-02-25 (×3): 75 mg via ORAL
  Filled 2014-02-23 (×4): qty 1

## 2014-02-23 MED ORDER — HYDROMORPHONE HCL 2 MG PO TABS
4.0000 mg | ORAL_TABLET | ORAL | Status: DC | PRN
  Administered 2014-02-23 – 2014-02-25 (×6): 4 mg via ORAL
  Filled 2014-02-23 (×7): qty 2

## 2014-02-23 MED ORDER — ONDANSETRON HCL 4 MG/2ML IJ SOLN: 4.0000 mg | Freq: Four times a day (QID) | INTRAMUSCULAR | Status: DC | PRN

## 2014-02-23 MED ORDER — ALBUTEROL SULFATE (2.5 MG/3ML) 0.083% IN NEBU
5.0000 mg | INHALATION_SOLUTION | Freq: Once | RESPIRATORY_TRACT | Status: AC
  Administered 2014-02-23: 5 mg via RESPIRATORY_TRACT
  Filled 2014-02-23: qty 6

## 2014-02-23 MED ORDER — SODIUM CHLORIDE 0.9 % IV SOLN: INTRAVENOUS | Status: DC

## 2014-02-23 MED ORDER — ASPIRIN 81 MG PO CHEW
81.0000 mg | CHEWABLE_TABLET | ORAL | Status: AC
  Administered 2014-02-24: 81 mg via ORAL
  Filled 2014-02-23: qty 1

## 2014-02-23 MED ORDER — ACETAMINOPHEN 325 MG PO TABS: 650.0000 mg | ORAL_TABLET | Freq: Four times a day (QID) | ORAL | Status: DC | PRN

## 2014-02-23 MED ORDER — CEFTRIAXONE SODIUM 1 G IJ SOLR
1.0000 g | INTRAMUSCULAR | Status: DC
  Filled 2014-02-23: qty 10

## 2014-02-23 MED ORDER — SODIUM CHLORIDE 0.9 % IJ SOLN
3.0000 mL | Freq: Two times a day (BID) | INTRAMUSCULAR | Status: DC
  Administered 2014-02-24: 3 mL via INTRAVENOUS

## 2014-02-23 MED ORDER — DEXTROSE 5 % IV SOLN
1.0000 g | Freq: Once | INTRAVENOUS | Status: AC
  Administered 2014-02-23: 1 g via INTRAVENOUS
  Filled 2014-02-23: qty 10

## 2014-02-23 MED ORDER — METOPROLOL SUCCINATE ER 100 MG PO TB24
100.0000 mg | ORAL_TABLET | Freq: Every morning | ORAL | Status: DC
  Administered 2014-02-23 – 2014-02-25 (×3): 100 mg via ORAL
  Filled 2014-02-23 (×3): qty 1

## 2014-02-23 MED ORDER — IPRATROPIUM BROMIDE 0.02 % IN SOLN
0.5000 mg | Freq: Once | RESPIRATORY_TRACT | Status: AC
  Administered 2014-02-23: 0.5 mg via RESPIRATORY_TRACT
  Filled 2014-02-23: qty 2.5

## 2014-02-23 MED ORDER — SODIUM CHLORIDE 0.9 % IJ SOLN
3.0000 mL | Freq: Two times a day (BID) | INTRAMUSCULAR | Status: DC
  Administered 2014-02-23 – 2014-02-24 (×2): 3 mL via INTRAVENOUS

## 2014-02-23 MED ORDER — ACETAMINOPHEN 650 MG RE SUPP: 650.0000 mg | Freq: Four times a day (QID) | RECTAL | Status: DC | PRN

## 2014-02-23 MED ORDER — POTASSIUM CHLORIDE CRYS ER 20 MEQ PO TBCR
40.0000 meq | EXTENDED_RELEASE_TABLET | Freq: Once | ORAL | Status: AC
  Administered 2014-02-23: 40 meq via ORAL
  Filled 2014-02-23: qty 2

## 2014-02-23 MED ORDER — INSULIN ASPART 100 UNIT/ML ~~LOC~~ SOLN
0.0000 [IU] | Freq: Three times a day (TID) | SUBCUTANEOUS | Status: DC
  Administered 2014-02-23: 8 [IU] via SUBCUTANEOUS
  Administered 2014-02-24: 2 [IU] via SUBCUTANEOUS
  Administered 2014-02-24 – 2014-02-25 (×2): 3 [IU] via SUBCUTANEOUS
  Administered 2014-02-25: 2 [IU] via SUBCUTANEOUS

## 2014-02-23 MED ORDER — LEVOTHYROXINE SODIUM 25 MCG PO TABS
25.0000 ug | ORAL_TABLET | Freq: Every day | ORAL | Status: DC
  Administered 2014-02-23 – 2014-02-25 (×3): 25 ug via ORAL
  Filled 2014-02-23 (×4): qty 1

## 2014-02-23 MED ORDER — HEPARIN SODIUM (PORCINE) 5000 UNIT/ML IJ SOLN
5000.0000 [IU] | Freq: Three times a day (TID) | INTRAMUSCULAR | Status: DC
  Administered 2014-02-23 – 2014-02-24 (×2): 5000 [IU] via SUBCUTANEOUS
  Filled 2014-02-23 (×5): qty 1

## 2014-02-23 MED ORDER — NITROGLYCERIN 0.4 MG SL SUBL: 0.4000 mg | SUBLINGUAL_TABLET | SUBLINGUAL | Status: DC | PRN

## 2014-02-23 MED ORDER — DEXTROSE 5 % IV SOLN
500.0000 mg | INTRAVENOUS | Status: DC
  Administered 2014-02-23: 500 mg via INTRAVENOUS
  Filled 2014-02-23: qty 500

## 2014-02-23 NOTE — ED Provider Notes (Signed)
CSN: [id number]     Arrival date & time 02/23/14  0623 History   First MD Initiated Contact with Patient 02/23/14 1115     Chief Complaint  Patient presents with  . Shortness of Breath     (Consider location/radiation/quality/duration/timing/severity/associated sxs/prior Treatment) HPI  Meagan [name] is a 68 y.o.female with a significant PMH of oxygen dependent on 4- 6 L nasal cannula at home, fibromyalgia, diabetes, hypertension, pulmonary edema (being treated at [facility]), ARF, restrictive lung disease, CAD, hx of pneumonia CHF presents to the ER with complaints of shortness of breath for the past two weeks that has been progressively getting worse. She was seeing her PCP Dr. Gerarda Fraction in [place], [place] this morning and sent her to the ER for further evaluation.   She has been having shortness of breath, wheezing, chills, cold sweats, loss of energy, with jaw pain on exertion for the past 2 weeks that has been gradually getting worse. She reports feeling as though she has a cold but only coughing up foamy substance. She denies having a fevers, confusion, vomiting, diarrhea, weakness. Vital signs are WNL in triage of the ER.   Past Medical History  Diagnosis Date  . Fibromyalgia   . Diabetes mellitus   . Hypercholesterolemia   . Hypertension     pulmonary  pap 110/31 (mean 62)by RHC 9/10 negative PE by chest CT 2010  . Pulmonary edema     Failed Revatio   . ARF (acute renal failure)     poor candidate for ACE -I or ARB  . Restrictive lung disease     obesity hypoventilation syndrome  . Renal insufficiency   . CAD (coronary artery disease)     multivessel DES x 2 RCA 2007  . Chronic diastolic heart failure   . MRSA (methicillin resistant staphylococcus aureus) pneumonia   . Pulmonary hypertension   . Diastolic heart failure   . Chronic anemia     anemia of chronic disease based on anemia panel 2011  . Pancreatic mass 08/01/2012   Past Surgical History  Procedure Laterality Date   . Abdominal hysterectomy    . Coronary artery bypass graft  1997    LIMA to LAD,SVG to OM  . Esophagogastroduodenoscopy  06/28/10    schatzki ring otherwise normal;small hiatal hernia   Family History  Problem Relation Age of Onset  . Cancer Mother     oral  . Heart attack Father   . COPD Brother   . Heart disease Brother    History  Substance Use Topics  . Smoking status: Never Smoker   . Smokeless tobacco: Not on file  . Alcohol Use: No   OB History   Grav Para Term Preterm Abortions TAB SAB Ect Mult Living                 Review of Systems  Constitutional: Positive for chills and diaphoresis. Negative for fever.  HENT: Negative for congestion.   Respiratory: Positive for cough, shortness of breath and wheezing.   Cardiovascular: Negative for chest pain and leg swelling.  Gastrointestinal: Negative for abdominal pain and constipation.  Genitourinary: Negative for frequency and vaginal bleeding.  Musculoskeletal: Negative for back pain.  Neurological: Negative for numbness and headaches.      Allergies  Aspirin; Codeine; Niacin; Nsaids; and Sulfonamide derivatives  Home Medications   Prior to Admission medications   Medication Sig Start Date End Date Taking? Authorizing Provider  ALPRAZolam Duanne Moron) 0.5 MG tablet Take 0.5 mg by  mouth 2 (two) times daily as needed. For sleep. *Prescribed one tablet three times daily as needed*   Yes Historical Provider, MD  clopidogrel (PLAVIX) 75 MG tablet Take 75 mg by mouth every morning.    Yes Historical Provider, MD  cyclobenzaprine (FLEXERIL) 10 MG tablet Take 10 mg by mouth 2 (two) times daily as needed for muscle spasms.   Yes Historical Provider, MD  DULoxetine (CYMBALTA) 60 MG capsule Take 60 mg by mouth every morning.    Yes Historical Provider, MD  Epoprostenol Sodium (FLOLAN IV) Inject into the vein. 1.5 mg. Inject into the vein continuously. Infuse mg/kg/min via cont IV pump.   Yes Historical Provider, MD  furosemide  (LASIX) 40 MG tablet Take 80 mg by mouth daily.    Yes Historical Provider, MD  HYDROmorphone (DILAUDID) 2 MG tablet Take 4 mg by mouth every 4 (four) hours as needed for severe pain.   Yes Historical Provider, MD  insulin glargine (LANTUS) 100 UNIT/ML injection Inject 26 Units into the skin every morning.    Yes Historical Provider, MD  isosorbide mononitrate (IMDUR) 60 MG 24 hr tablet Take 90 mg by mouth every morning.    Yes Historical Provider, MD  levothyroxine (SYNTHROID, LEVOTHROID) 25 MCG tablet Take 25 mcg by mouth every morning.    Yes Historical Provider, MD  metoprolol (TOPROL-XL) 100 MG 24 hr tablet Take 100 mg by mouth every morning.    Yes Historical Provider, MD  naproxen sodium (ALEVE) 220 MG tablet Take 440 mg by mouth daily as needed. *Takes only as needed when other pain medications are not used*   Yes Historical Provider, MD  nitroGLYCERIN (NITROSTAT) 0.4 MG SL tablet Place 0.4 mg under the tongue every 5 (five) minutes as needed for chest pain.    Yes Historical Provider, MD  omeprazole (PRILOSEC) 20 MG capsule Take 20 mg by mouth 2 (two) times daily.     Yes Historical Provider, MD  ondansetron (ZOFRAN) 8 MG tablet Take 8 mg by mouth every 8 (eight) hours as needed. For nausea    Yes Historical Provider, MD  potassium chloride SA (K-DUR,KLOR-CON) 20 MEQ tablet Take 40 mEq by mouth daily.  09/02/12  Yes Historical Provider, MD  silver sulfADIAZINE (SILVADENE) 1 % cream Apply 1 application topically daily. Apply as needed to areas of breakdown on buttocks.   Yes Historical Provider, MD  traMADol (ULTRAM) 50 MG tablet Take 50 mg by mouth every 3 (three) hours as needed. For pain. *takes with Dilaudid (Hydromorphone) 04/22/12  Yes Historical Provider, MD  VYTORIN 10-40 MG per tablet TAKE ONE TABLET AT BEDTIME 06/26/13  Yes Jolaine Artist, MD  Oxygen Perm[employer] SOLN Inhale into the lungs continuous. 6 liters 12/31/11   Shaune Pascal Bensimhon, MD   BP 153/63  Pulse 88   Temp(Src) 98.1 F (36.7 C) (Oral)  Resp 20  SpO2 98% Physical Exam  Nursing note and vitals reviewed. Constitutional: She appears well-developed and well-nourished. No distress.  HENT:  Head: Normocephalic and atraumatic.  Eyes: Pupils are equal, round, and reactive to light.  Neck: Normal range of motion. Neck supple.  Cardiovascular: Normal rate and regular rhythm.   Pulmonary/Chest: Effort normal. She has wheezes. She has rhonchi. She has rales.  Pt on [place] 5 liters. Able to speak in full sentences   Abdominal: Soft.  Neurological: She is alert.  Skin: Skin is warm and dry.    ED Course  Procedures (including critical care time) Labs Review  Labs Reviewed  CBC - Abnormal; Notable for the following:    WBC 12.6 (*)    RBC 3.52 (*)    Hemoglobin 10.3 (*)    HCT 31.7 (*)    All other components within normal limits  COMPREHENSIVE METABOLIC PANEL - Abnormal; Notable for the following:    Glucose, Bld 236 (*)    Albumin 3.2 (*)    Alkaline Phosphatase 141 (*)    Total Bilirubin 0.2 (*)    GFR calc non Af Amer 58 (*)    GFR calc Af Amer 67 (*)    All other components within normal limits  PRO B NATRIURETIC PEPTIDE - Abnormal; Notable for the following:    Pro B Natriuretic peptide (BNP) 803.2 (*)    All other components within normal limits  I-STAT TROPOININ, ED    Imaging Review Dg Chest 2 View  02/23/2014   CLINICAL DATA:  Shortness of breath.  EXAM: CHEST  2 VIEW  COMPARISON:  07/22/2012 and prior chest radiographs  FINDINGS: Cardiomegaly and CABG changes noted.  A venous catheter with tip overlying the cavoatrial junction again noted.  Patchy left lower lobe opacity may represent atelectasis or airspace disease/ pneumonia.  Chronic pulmonary opacities and peribronchial thickening again noted.  There is no evidence of pleural effusion or pneumothorax.  IMPRESSION: Patchy left lower lobe opacity which may be related to chronic changes/atelectasis but pneumonia is not excluded.   Cardiomegaly and CABG changes.   Electronically Signed   By: Hassan Rowan M.D.   On: 02/23/2014 10:52     EKG Interpretation   Date/Time:  Tuesday Feb 23 2014 12:13:14 EDT Ventricular Rate:  90 PR Interval:  145 QRS Duration: 135 QT Interval:  425 QTC Calculation: 520 R Axis:   -3 Text Interpretation:  Sinus rhythm Right bundle branch block Abnormal T,  consider ischemia, lateral leads similar to prior Confirmed by Mingo Amber  MD,  [place] ([id number]) on 02/23/2014 12:16:58 PM      MDM   Final diagnoses:  Chronic pulmonary hypertension  Community acquired pneumonia    I discussed the case with Dr. Gerarda Fraction, pt was intended to be a direct admit to Dr. Sung Amabile. I spoke with Dr. Carter Kitten- H e reviewed her xray and is also concerned for possible pneumonia, especially with the symptoms and lab work. She is on Flolan which he will manage. He will also monitor her from a cardiac standpoint and requests medicine admission.  She is unassigned, Internal Medicine to admit, They have agreed to come see patient and will put in holding orders.      Linus Mako, PA-C 02/23/14 1253

## 2014-02-23 NOTE — Consult Note (Signed)
Advanced Heart Failure Team Consult Note   Primary Physician: L. Fusco Primary Cardiologist:  DB  Reason for Consultation: CP  HPI:    Meagan [name] is a 68 y/o woman with multiple medical problems including CAD s/p CABG and RCA stent, DM2, obesity, fibromyalgia, diastolic HF. She has severe PAH and is on Flolan followed by Dr. Gilles Chiquito at [facility] Presents through ER for several week h/o increasing jaw pain and DOE.   PFTs 10/10: Restrictive lung disease FEV1 1.01L (56%) FVC 1.2 (48%) DLCO 24%  Rheum work-up which was negative.   RHC/LHC 01/21/13  RA = 11  RV = 82/0/15  PA = 78/18 (39)  PCW = 19  Fick cardiac output/index = 6.1/3.6  PVR = 3.3 [place]  FA sat = 93%  PA sat = 62%,64%  Ao Pressure: 106/39 (65)  LV Pressure: 124/5/16  Left main: Heavily calcified. Distal 40-50%  LAD: Diffuse severe disease. Totalled in midsection. Mid to distal vessel fills from LIMA. 2 diagonals with diffuse disease. There is a 70-80% lesion in the midsection of the 2nd diagonal  LCX: Small heavily diseased vessel. 30-40% ostial. Long 95% lesion in mid AV groove after take-off of OM-1. Distal vessel small and diffusely diseased. OM-1 mild plaque. OM-2 and OM-3 occluded  RCA: Ostial spasm. Long stent in midsection is patent. Distal RCA 40% before PDA. Mild to moderate plaque in PD and PLs.  LV-gram done in the RAO projection: Ejection fraction = 60-65% No wall motion abnormalities  LIMA-LAD: widely patent with 50-60% lesion in LAD after insertion of LIMA  SVG-OM: totally occluded proximally (chronic)   Echo 06/30/13: EF 60% RV moderately dilated. Evidence cor pulmonale RVSP 54  Says that over past few weeks has had exertional jaw which is now occuring with minimal activity. Feels like her jaw claudication but is responsive to NTG. Also has had progressive DOE and cough productive of white sputum. Weight up a few days ago but got better with extra diuretics. Saw PCP who contacted [place] over concern for CP. Came to  ER. Troponin and ECG negative. CXR with possible LLL PNA and WBC 12k. +active wheezing. Denies fevers and chills.  Started on abx and being admitted to IM. pBNP 800 which is above baseline.   Review of Systems: [y] = yes, [ ]  = no   General: Weight gain [ ] ; Weight loss [ ] ; Anorexia [ ] ; Fatigue [name] ]; Fever [ ] ; Chills [ ] ; Weakness [ ]   Cardiac: Chest pain/pressure [name] ]; Resting SOB [ ] ; Exertional SOB [name] ]; Orthopnea [ ] ; Pedal Edema [ ] ; Palpitations [ ] ; Syncope [ ] ; Presyncope [ ] ; Paroxysmal nocturnal dyspnea[ ]   Pulmonary: Cough [name] ]; Wheezing[ y]; Hemoptysis[ ] ; Sputum [ ] ; Snoring [ ]   GI: Vomiting[ ] ; Dysphagia[ ] ; Melena[ ] ; Hematochezia [ ] ; Heartburn[ ] ; Abdominal pain [ ] ; Constipation [ ] ; Diarrhea [ ] ; BRBPR [ ]   GU: Hematuria[ ] ; Dysuria [ ] ; Nocturia[ ]   Vascular: Pain in legs with walking [ ] ; Pain in feet with lying flat [ ] ; Non-healing sores [ ] ; Stroke [ ] ; TIA [ ] ; Slurred speech [ ] ;  Neuro: Headaches[ ] ; Vertigo[ ] ; Seizures[ ] ; Paresthesias[ ] ;Blurred vision [ ] ; Diplopia [ ] ; Vision changes [ ]   Ortho/Skin: Arthritis [ ] ; Joint pain [name] ]; Muscle pain [ ] ; Joint swelling [ ] ; Back Pain [ y]; Rash [ ]   Psych: Depression[ ] ; Anxiety[ ]   Heme: Bleeding problems [ ] ; Clotting disorders [ ] ; Anemia [ ]   Endocrine: Diabetes [name] ]; Thyroid dysfunction[ ]   Home Medications Prior to Admission medications   Medication Sig Start Date End Date Taking? Authorizing Provider  ALPRAZolam Duanne Moron) 0.5 MG tablet Take 0.5 mg by mouth 2 (two) times daily as needed. For sleep. *Prescribed one tablet three times daily as needed*   Yes Historical Provider, MD  clopidogrel (PLAVIX) 75 MG tablet Take 75 mg by mouth every morning.    Yes Historical Provider, MD  cyclobenzaprine (FLEXERIL) 10 MG tablet Take 10 mg by mouth 2 (two) times daily as needed for muscle spasms.   Yes Historical Provider, MD  DULoxetine (CYMBALTA) 60 MG capsule Take 60 mg by mouth every morning.    Yes Historical  Provider, MD  Epoprostenol Sodium (FLOLAN IV) Inject into the vein. 1.5 mg. Inject into the vein continuously. Infuse mg/kg/min via cont IV pump.   Yes Historical Provider, MD  furosemide (LASIX) 40 MG tablet Take 80 mg by mouth daily.    Yes Historical Provider, MD  HYDROmorphone (DILAUDID) 2 MG tablet Take 4 mg by mouth every 4 (four) hours as needed for severe pain.   Yes Historical Provider, MD  insulin glargine (LANTUS) 100 UNIT/ML injection Inject 26 Units into the skin every morning.    Yes Historical Provider, MD  isosorbide mononitrate (IMDUR) 60 MG 24 hr tablet Take 90 mg by mouth every morning.    Yes Historical Provider, MD  levothyroxine (SYNTHROID, LEVOTHROID) 25 MCG tablet Take 25 mcg by mouth every morning.    Yes Historical Provider, MD  metoprolol (TOPROL-XL) 100 MG 24 hr tablet Take 100 mg by mouth every morning.    Yes Historical Provider, MD  naproxen sodium (ALEVE) 220 MG tablet Take 440 mg by mouth daily as needed. *Takes only as needed when other pain medications are not used*   Yes Historical Provider, MD  nitroGLYCERIN (NITROSTAT) 0.4 MG SL tablet Place 0.4 mg under the tongue every 5 (five) minutes as needed for chest pain.    Yes Historical Provider, MD  omeprazole (PRILOSEC) 20 MG capsule Take 20 mg by mouth 2 (two) times daily.     Yes Historical Provider, MD  ondansetron (ZOFRAN) 8 MG tablet Take 8 mg by mouth every 8 (eight) hours as needed. For nausea    Yes Historical Provider, MD  potassium chloride SA (K-DUR,KLOR-CON) 20 MEQ tablet Take 40 mEq by mouth daily.  09/02/12  Yes Historical Provider, MD  silver sulfADIAZINE (SILVADENE) 1 % cream Apply 1 application topically daily. Apply as needed to areas of breakdown on buttocks.   Yes Historical Provider, MD  traMADol (ULTRAM) 50 MG tablet Take 50 mg by mouth every 3 (three) hours as needed. For pain. *takes with Dilaudid (Hydromorphone) 04/22/12  Yes Historical Provider, MD  VYTORIN 10-40 MG per tablet TAKE ONE TABLET  AT BEDTIME 06/26/13  Yes Jolaine Artist, MD  Oxygen Perm[employer] SOLN Inhale into the lungs continuous. 6 liters 12/31/11   Jolaine Artist, MD    Past Medical History: Past Medical History  Diagnosis Date  . Fibromyalgia   . Diabetes mellitus   . Hypercholesterolemia   . Hypertension     pulmonary  pap 110/31 (mean 62)by RHC 9/10 negative PE by chest CT 2010  . Pulmonary edema     Failed Revatio   . ARF (acute renal failure)     poor candidate for ACE -I or ARB  . Restrictive lung disease     obesity hypoventilation syndrome  .  Renal insufficiency   . CAD (coronary artery disease)     multivessel DES x 2 RCA 2007  . Chronic diastolic heart failure   . MRSA (methicillin resistant staphylococcus aureus) pneumonia   . Pulmonary hypertension   . Diastolic heart failure   . Chronic anemia     anemia of chronic disease based on anemia panel 2011  . Pancreatic mass 08/01/2012    Past Surgical History: Past Surgical History  Procedure Laterality Date  . Abdominal hysterectomy    . Coronary artery bypass graft  1997    LIMA to LAD,SVG to OM  . Esophagogastroduodenoscopy  06/28/10    schatzki ring otherwise normal;small hiatal hernia    Family History: Family History  Problem Relation Age of Onset  . Cancer Mother     oral  . Heart attack Father   . COPD Brother   . Heart disease Brother     Social History: History   Social History  . Marital Status: Married    Spouse Name: N/A    Number of Children: N/A  . Years of Education: N/A   Social History Main Topics  . Smoking status: Never Smoker   . Smokeless tobacco: None  . Alcohol Use: No  . Drug Use: No  . Sexual Activity:    Other Topics Concern  . None   Social History Narrative  . None    Allergies:  Allergies  Allergen Reactions  . Aspirin     REACTION: Intolerance  . Codeine Other (See Comments)    Messes with digestive system  . Niacin Other (See Comments)    Pins sticking into  skin  . Nsaids Other (See Comments)    REACTION: Aggravates Digestive System   . Sulfonamide Derivatives Other (See Comments)    Messes with digestive system    Objective:    Vital Signs:   Temp:  [98.1 F (36.7 C)-98.4 F (36.9 C)] 98.1 F (36.7 C) (05/05 1145) Pulse Rate:  [84-88] 88 (05/05 1217) Resp:  [16-21] 20 (05/05 1217) BP: (137-160)/(53-63) 153/63 mmHg (05/05 1217) SpO2:  [96 %-100 %] 98 % (05/05 1217)    Weight change: There were no vitals filed for this visit.  Intake/Output:  No intake or output data in the 24 hours ending 02/23/14 1345   Physical Exam: General: No distress. Wearing 5-6L [place] O2  HEENT: normal  Neck: thick . JVP jaw Carotids 2+ bilat; no bruits. No lymphadenopathy or thryomegaly appreciated.  Cor: PMI nonpalpable Regular rate & rhythm. 2/6 TR. P2 perhaps mildly accentuated but not severely so  Hickman catheter ok  Lungs: clear but decreased throughout no wheezing on 6 liters [place]  Abdomen: obese. soft, nontender, nondistended. No hepatosplenomegaly. No bruits or masses. Good bowel sounds.  Extremities: no cyanosis, clubbing, macular-papular erythematous rash on LEs, no edema  Neuro: alert & oriented x 3, cranial nerves grossly intact. moves all 4 extremities w/o difficulty. Affect pleasant.   Telemetry: SR 90s  Labs: Basic Metabolic Panel:  Recent Labs Lab 02/23/14 1006  NA 140  K 4.1  CL 96  CO2 32  GLUCOSE 236*  BUN 14  CREATININE 0.98  CALCIUM 9.2    Liver Function Tests:  Recent Labs Lab 02/23/14 1006  AST 10  ALT 10  ALKPHOS 141*  BILITOT 0.2*  PROT 6.6  ALBUMIN 3.2*   No results found for this basename: LIPASE, AMYLASE,  in the last 168 hours No results found for this basename: AMMONIA,  in the  last 168 hours  CBC:  Recent Labs Lab 02/23/14 1006  WBC 12.6*  HGB 10.3*  HCT 31.7*  MCV 90.1  PLT 285    Cardiac Enzymes: No results found for this basename: CKTOTAL, CKMB, CKMBINDEX, TROPONINI,  in the last  168 hours  BNP: BNP (last 3 results)  Recent Labs  02/23/14 1006  PROBNP 803.2*    CBG: No results found for this basename: GLUCAP,  in the last 168 hours  Coagulation Studies: No results found for this basename: LABPROT, INR,  in the last 72 hours  Other results: EKG: NSR 90 chronic RBBB No ST-T wave abnormalities.    Imaging: Dg Chest 2 View  02/23/2014   CLINICAL DATA:  Shortness of breath.  EXAM: CHEST  2 VIEW  COMPARISON:  07/22/2012 and prior chest radiographs  FINDINGS: Cardiomegaly and CABG changes noted.  A venous catheter with tip overlying the cavoatrial junction again noted.  Patchy left lower lobe opacity may represent atelectasis or airspace disease/ pneumonia.  Chronic pulmonary opacities and peribronchial thickening again noted.  There is no evidence of pleural effusion or pneumothorax.  IMPRESSION: Patchy left lower lobe opacity which may be related to chronic changes/atelectasis but pneumonia is not excluded.  Cardiomegaly and CABG changes.   Electronically Signed   By: Hassan Rowan M.D.   On: 02/23/2014 10:52      Medications:     Current Medications: . furosemide  80 mg Intravenous Once  . potassium chloride  40 mEq Oral Once     Infusions: . sodium chloride    . azithromycin    . cefTRIAXone (ROCEPHIN)  IV        Assessment:   1. Chest pain - ? Progressive angina 2. CAD s/p CABG and previous RCA stent 3. PAH on Flolan 4. A/c diastolic HF 5. LLL opacity - ? CAP 6. DM2   Plan/Discussion:    Progressive exertional chest and jaw pain are concerning for ischemia. CE and ECG currently normal. She also appears to have some volume overload. Agree with admission for diuresis and treatment of possible PNA. Will plan R and L cath tomorrow.   Continue Flolan for severe PAH. Pharmacy aware.   Appreciate IMTS assistance.   Length of Stay: 0  Shaune Pascal [place] 02/23/2014, 1:45 PM  Advanced Heart Failure Team Pager [phone] (M-F; 7a - 4p)  Please  contact [facility] for night-coverage after hours (4p -7a ) and weekends on [email or web address]

## 2014-02-23 NOTE — ED Notes (Signed)
Pt reports Dr. Sung Amabile to admit for ongoing shortness of breath with exertion. Called PCP who called cardiologist and told to come in. Shortness of breath x couple of weeks. Pt on home o2.

## 2014-02-23 NOTE — ED Provider Notes (Signed)
Medical screening examination/treatment/procedure(s) were conducted as a shared visit with non-physician practitioner(s) and myself.  I personally evaluated the patient during the encounter.   EKG Interpretation   Date/Time:  Tuesday Feb 23 2014 12:13:14 EDT Ventricular Rate:  90 PR Interval:  145 QRS Duration: 135 QT Interval:  425 QTC Calculation: 520 R Axis:   -3 Text Interpretation:  Sinus rhythm Right bundle branch block Abnormal T,  consider ischemia, lateral leads similar to prior Confirmed by Mingo Amber  MD,  [place] ([id number]) on 02/23/2014 12:16:58 PM       Patient here with SOB - sent from Cardiology office. CXR, reviewed by me and Delos Haring, shows LLL PNA. CAP coverage initiated. With her hx and her being on Flolan, admitted to medicine.  Osvaldo Shipper, MD 02/23/14 2020

## 2014-02-23 NOTE — ED Notes (Signed)
Second RN attempt at access unsuccessful, IV team paged

## 2014-02-23 NOTE — ED Notes (Signed)
Unsuccessful IV attempt x2, asked for assistance for anther RN to look and try to start IV.

## 2014-02-23 NOTE — H&P (Signed)
Patient seen and examined. Patient is a 68 year old female with PMH significant for DM, chronic diastolic HF, CAD s/p CABG s/p stent to RCA, PAH on Flolan, fibromyalgia who presents for worsening SOB * 1 week. Pt states that over the past week she has noted slightly worsening sob associated with wheezing especially at night. No cough, no fevers/chills, no CP, no abd pain, no nausea or vomiting, no syncope. Pt has had jaw claudication while eating since starting Flolan but she states that over the past week she gets jaw claudication on exertion. She had gone to her PCP who referred her to [facility] since her cardiologist comes here. Currently patient feels slightly better. EKG in ED with RBBB, non specific ST/T wave changes. Remaining ROS negative  Cardiovascular- Norma heart sounds, regular rate and rhythm Lungs- bibasilar crackles + , R>L Abdomen- soft, non tender, bowel sounds + No lower extremity edema   Acute on chronic diastolic heart failure, CAD with probable angina - continue with IV lasix, Strict I's and O's - Patient for cath tomorrow as per cardio - Cardio consult appreciated - Would continue with current cardiac meds - check repeat EKG in AM.   Possible Pneumonia - Chest X ray with possible infiltrate v/s atelectasis - given x ray findings and mild leukocytosis will treat with ceftriaxone,azithromycin for now - Monitor O2 sats  PAH - continue with Flolan for now - Outpatient follow up with her pulmonologist at [facility]  Diabetes - Will monitor CBGs - Lantus on hold. Continue with insulin coverage for now

## 2014-02-23 NOTE — Care Management Note (Addendum)
    Page 1 of 1   02/25/2014     2:12:16 PM CARE MANAGEMENT NOTE 02/25/2014  Patient:  Meagan [name], Meagan [name]   Account Number:  [id number]  Date Initiated:  02/23/2014  Documentation initiated by:  Elissa Hefty  Subjective/Objective Assessment:   adm w chf, poss pneumonia     Action/Plan:   lives w husband, pcp dr l fusco   Anticipated DC Date:  02/25/2014   Anticipated DC Plan:  HOME/SELF CARE         PAC Choice  Resumption Of Svcs/PTA Provider   Choice offered to / List presented to:             Status of service:   Medicare Important Message given?   (If response is "NO", the following Medicare IM given date fields will be blank) Date Medicare IM given:   Date Additional Medicare IM given:    Discharge Disposition:  HOME/SELF CARE  Per UR Regulation:  Reviewed for med. necessity/level of care/duration of stay  If discussed at Long Length of Stay Meetings, dates discussed:    Comments:  5/7 1410  debbie Micheale Schlack rn,bsn pt and husband in room. pt gets home iv flolan and company that provides med is [facility] that was arranged by [employer]. pt's husb and pt handle eq and [facility] funishes meds and supplies.

## 2014-02-23 NOTE — ED Notes (Signed)
IV team at bedside to attempt IV access 

## 2014-02-23 NOTE — H&P (Signed)
Date: 02/23/2014               Patient Name:  Meagan [name] MRN: [id number]  DOB: 02/05/1946 Age / Sex: 68 y.o., female   PCP: Redmond School, MD         Medical Service: Internal Medicine Teaching Service         Attending Physician: Dr. Aldine Contes, MD    First Contact: Dr. Lum Babe, MD Pager: [phone]  Second Contact: Dr. Clinton Gallant, MD Pager: [phone]       After Hours (After 5p/  First Contact Pager: [phone]  weekends / holidays): Second Contact Pager: [phone]   Chief Complaint: SOB  History of Present Illness: Meagan [name] is a 68 y.o. woman who presents with a pmhx of heart failure, pulm HTN on IV Flolan who presents with a cc of exertional chest and jaw pain. The patient was in her normal state of health until a few weeks ago when she started developing slowly progressive SOB and increased orthopnea. She reports associated symptom of jaw claudication. The jaw claudication manifest with the onset of eating, but resolves. Over the last few days she notes jaw pain and chest pain with exertion. The patient has been compliant with her medications.  She denies fevers chills. She has some night sweats with mild dampness of her head (no drenching sweats) that is associated with using numerous heating blankets. She denies active chest pain. She admits to her chronic baseline LE pain (fibromyagia).  Meds: Current Facility-Administered Medications  Medication Dose Route Frequency Provider Last Rate Last Dose  . 0.9 %  sodium chloride infusion   Intravenous STAT Linus Mako, PA-C 75 mL/hr at 02/23/14 1355    . azithromycin (ZITHROMAX) 500 mg in dextrose 5 % 250 mL IVPB  500 mg Intravenous Once Linus Mako, PA-C       Current Outpatient Prescriptions  Medication Sig Dispense Refill  . ALPRAZolam (XANAX) 0.5 MG tablet Take 0.5 mg by mouth 2 (two) times daily as needed. For sleep. *Prescribed one tablet three times daily as needed*      . clopidogrel (PLAVIX) 75 MG  tablet Take 75 mg by mouth every morning.       . cyclobenzaprine (FLEXERIL) 10 MG tablet Take 10 mg by mouth 2 (two) times daily as needed for muscle spasms.      . DULoxetine (CYMBALTA) 60 MG capsule Take 60 mg by mouth every morning.       Marland Kitchen Epoprostenol Sodium (FLOLAN IV) Inject into the vein. 1.5 mg. Inject into the vein continuously. Infuse mg/kg/min via cont IV pump.      . furosemide (LASIX) 40 MG tablet Take 80 mg by mouth daily.       Marland Kitchen HYDROmorphone (DILAUDID) 2 MG tablet Take 4 mg by mouth every 4 (four) hours as needed for severe pain.      Marland Kitchen insulin glargine (LANTUS) 100 UNIT/ML injection Inject 26 Units into the skin every morning.       . isosorbide mononitrate (IMDUR) 60 MG 24 hr tablet Take 90 mg by mouth every morning.       Marland Kitchen levothyroxine (SYNTHROID, LEVOTHROID) 25 MCG tablet Take 25 mcg by mouth every morning.       . metoprolol (TOPROL-XL) 100 MG 24 hr tablet Take 100 mg by mouth every morning.       . naproxen sodium (ALEVE) 220 MG tablet Take 440 mg by mouth daily as needed. *Takes only  as needed when other pain medications are not used*      . nitroGLYCERIN (NITROSTAT) 0.4 MG SL tablet Place 0.4 mg under the tongue every 5 (five) minutes as needed for chest pain.       Marland Kitchen omeprazole (PRILOSEC) 20 MG capsule Take 20 mg by mouth 2 (two) times daily.        . ondansetron (ZOFRAN) 8 MG tablet Take 8 mg by mouth every 8 (eight) hours as needed. For nausea       . potassium chloride SA (K-DUR,KLOR-CON) 20 MEQ tablet Take 40 mEq by mouth daily.       . silver sulfADIAZINE (SILVADENE) 1 % cream Apply 1 application topically daily. Apply as needed to areas of breakdown on buttocks.      . traMADol (ULTRAM) 50 MG tablet Take 50 mg by mouth every 3 (three) hours as needed. For pain. *takes with Dilaudid (Hydromorphone)      . VYTORIN 10-40 MG per tablet TAKE ONE TABLET AT BEDTIME  90 tablet  3  . Oxygen Permeable [employer] SOLN Inhale into the lungs continuous. 6 liters      .  [DISCONTINUED] cetirizine (ZYRTEC) 10 MG tablet Take 10 mg by mouth daily.          Allergies: Allergies as of 02/23/2014 - Review Complete 02/23/2014  Allergen Reaction Noted  . Aspirin    . Codeine Other (See Comments)   . Niacin Other (See Comments)   . Nsaids Other (See Comments)   . Sulfonamide derivatives Other (See Comments)    Past Medical History  Diagnosis Date  . Fibromyalgia   . Diabetes mellitus   . Hypercholesterolemia   . Hypertension     pulmonary  pap 110/31 (mean 62)by RHC 9/10 negative PE by chest CT 2010  . Pulmonary edema     Failed Revatio   . ARF (acute renal failure)     poor candidate for ACE -I or ARB  . Restrictive lung disease     obesity hypoventilation syndrome  . Renal insufficiency   . CAD (coronary artery disease)     multivessel DES x 2 RCA 2007  . Chronic diastolic heart failure   . MRSA (methicillin resistant staphylococcus aureus) pneumonia   . Pulmonary hypertension   . Diastolic heart failure   . Chronic anemia     anemia of chronic disease based on anemia panel 2011  . Pancreatic mass 08/01/2012   Past Surgical History  Procedure Laterality Date  . Abdominal hysterectomy    . Coronary artery bypass graft  1997    LIMA to LAD,SVG to OM  . Esophagogastroduodenoscopy  06/28/10    schatzki ring otherwise normal;small hiatal hernia   Family History  Problem Relation Age of Onset  . Cancer Mother     oral  . Heart attack Father   . COPD Brother   . Heart disease Brother    History   Social History  . Marital Status: Married    Spouse Name: N/A    Number of Children: N/A  . Years of Education: N/A   Occupational History  . Not on file.   Social History Main Topics  . Smoking status: Never Smoker   . Smokeless tobacco: Not on file  . Alcohol Use: No  . Drug Use: No  . Sexual Activity:    Other Topics Concern  . Not on file   Social History Narrative  . No narrative on file  Review of Systems: Pertinent items  are noted in HPI.  Physical Exam: Blood pressure 153/63, pulse 88, temperature 98.1 F (36.7 C), temperature source Oral, resp. rate 20, SpO2 98.00%. Physical Exam  Constitutional: She is oriented to person, place, and time. She appears well-developed and well-nourished. No distress.  HENT:  Mouth/Throat: Oropharynx is clear and moist.  Cardiovascular: Normal rate, regular rhythm and intact distal pulses.   Murmur heard. 2/6 murmur loudest at RUSB  Pulmonary/Chest: Effort normal. No respiratory distress. She has no wheezes. She has rales.  Rales to mid lung bil L>R  Musculoskeletal: She exhibits no edema.  Neurological: She is alert and oriented to person, place, and time.  Skin: She is not diaphoretic.  Psychiatric: She has a normal mood and affect. Her behavior is normal.     Lab results: Basic Metabolic Panel:  Recent Labs  02/23/14 1006  NA 140  K 4.1  CL 96  CO2 32  GLUCOSE 236*  BUN 14  CREATININE 0.98  CALCIUM 9.2   Liver Function Tests:  Recent Labs  02/23/14 1006  AST 10  ALT 10  ALKPHOS 141*  BILITOT 0.2*  PROT 6.6  ALBUMIN 3.2*   CBC:  Recent Labs  02/23/14 1006  WBC 12.6*  HGB 10.3*  HCT 31.7*  MCV 90.1  PLT 285   BNP:  Recent Labs  02/23/14 1006  PROBNP 803.2*   Imaging results:  Dg Chest 2 View  02/23/2014   CLINICAL DATA:  Shortness of breath.  EXAM: CHEST  2 VIEW  COMPARISON:  07/22/2012 and prior chest radiographs  FINDINGS: Cardiomegaly and CABG changes noted.  A venous catheter with tip overlying the cavoatrial junction again noted.  Patchy left lower lobe opacity may represent atelectasis or airspace disease/ pneumonia.  Chronic pulmonary opacities and peribronchial thickening again noted.  There is no evidence of pleural effusion or pneumothorax.  IMPRESSION: Patchy left lower lobe opacity which may be related to chronic changes/atelectasis but pneumonia is not excluded.  Cardiomegaly and CABG changes.   Electronically Signed    By: Hassan Rowan M.D.   On: 02/23/2014 10:52    Other results: EKG: nonspecific ST and T waves changes, RBBB.  Assessment & Plan by Problem: Active Problems:   DIABETES MELLITUS, TYPE II, CONTROLLED   PULMONARY HYPERTENSION   Acute on chronic diastolic heart failure   Nausea   Pneumonia  Acute on Chronic Heart Failure The patient has a history of dCHF, CAD s/p CABG and RCA stenting, and PAH. She is treated with flolan for PAH. She is taking Metoprolol and Imdur as well. Her heart failure is managed by Dr. Haroldine Laws as outpatient. He was consulted at this admission and will be managing her acute on chronic heart failure.  - Plan for IV diuresis with lasix. (80 mg in ED), 40 meq K in ED. Mag BMP BID - monitor I/O strict, daily weight - repeat CXR and EKG in am. - Cath per Dr. Haroldine Laws on 5/6  Pneumonia The patient possibly has a pneumonia given SOB cough and concerning CXR. Plan to treat with empiric ABX (azithro ceftriaxone) for CAP.  [place] Patient on flolan. Appears to be have dose limiting side effect of jaw claudication. Patient reports eating well despite this pain. Pharmacy was made aware of the flolan. Patients husband will provide [place] with flolan from home.  - Flolan will be managed by Dr. Haroldine Laws.  Nausea The patients nausea is unknown etiology at this time. No vommitting, bloody bowel  movement of melena. Plan to treat symptomatically. - prn zofran  DM Hold home lantus SSI  Anxiety Xanax 0.5 mg prn BID  Fibromyalgia Appears stable. Continue home meds (Cymbalta and Dilaudid 4 mg PO q 4 hr prn)  GERD Continue home PPI  History of Skin Breakdown on Buttocks Consult wound care for recs. On silvadene as outpatient.  Dyslipidemia Appears stable. Continue home Vytorin.  DVT: heparin  Diet: Heart Carb Dieat  Dispo: Disposition is deferred at this time, awaiting improvement of current medical problems. Anticipated discharge in approximately 2-3 day(s).   The patient  does have a current PCP Redmond School, MD) and does not need an [facility] hospital follow-up appointment after discharge.  The patient does not know have transportation limitations that hinder transportation to clinic appointments.  Signed: Marrion Coy, MD 02/23/2014, 2:28 PM

## 2014-02-24 ENCOUNTER — Encounter (HOSPITAL_COMMUNITY)
Admission: EM | Disposition: A | Discharge status: Home / Self Care | Payer: Self-pay | Source: Home / Self Care | Attending: Internal Medicine

## 2014-02-24 ENCOUNTER — Encounter (HOSPITAL_COMMUNITY): Payer: Self-pay | Admitting: General Practice

## 2014-02-24 DIAGNOSIS — I2510 Atherosclerotic heart disease of native coronary artery without angina pectoris: Secondary | ICD-10-CM

## 2014-02-24 HISTORY — PX: LEFT AND RIGHT HEART CATHETERIZATION WITH CORONARY/GRAFT ANGIOGRAM: SHX5448

## 2014-02-24 LAB — POCT I-STAT 3, ART BLOOD GAS (G3+)
ACID-BASE EXCESS: 7 mmol/L — AB (ref 0.0–2.0)
BICARBONATE: 32.5 meq/L — AB (ref 20.0–24.0)
O2 Saturation: 94 %
PH ART: 7.409 (ref 7.350–7.450)
TCO2: 34 mmol/L (ref 0–100)
pCO2 arterial: 51.3 mmHg — ABNORMAL HIGH (ref 35.0–45.0)
pO2, Arterial: 72 mmHg — ABNORMAL LOW (ref 80.0–100.0)

## 2014-02-24 LAB — CBC
HCT: 31.4 % — ABNORMAL LOW (ref 36.0–46.0)
HEMOGLOBIN: 10.1 g/dL — AB (ref 12.0–15.0)
MCH: 28.9 pg (ref 26.0–34.0)
MCHC: 32.2 g/dL (ref 30.0–36.0)
MCV: 89.7 fL (ref 78.0–100.0)
Platelets: 284 10*3/uL (ref 150–400)
RBC: 3.5 MIL/uL — AB (ref 3.87–5.11)
RDW: 12.8 % (ref 11.5–15.5)
WBC: 12 10*3/uL — ABNORMAL HIGH (ref 4.0–10.5)

## 2014-02-24 LAB — BASIC METABOLIC PANEL
BUN: 13 mg/dL (ref 6–23)
CO2: 33 meq/L — AB (ref 19–32)
Calcium: 8.8 mg/dL (ref 8.4–10.5)
Chloride: 98 mEq/L (ref 96–112)
Creatinine, Ser: 0.82 mg/dL (ref 0.50–1.10)
GFR calc Af Amer: 83 mL/min — ABNORMAL LOW (ref >90)
GFR, EST NON AFRICAN AMERICAN: 71 mL/min — AB (ref >90)
Glucose, Bld: 143 mg/dL — ABNORMAL HIGH (ref 70–99)
Potassium: 4.1 mEq/L (ref 3.7–5.3)
SODIUM: 142 meq/L (ref 137–147)

## 2014-02-24 LAB — GLUCOSE, CAPILLARY
GLUCOSE-CAPILLARY: 103 mg/dL — AB (ref 70–99)
GLUCOSE-CAPILLARY: 141 mg/dL — AB (ref 70–99)
Glucose-Capillary: 134 mg/dL — ABNORMAL HIGH (ref 70–99)
Glucose-Capillary: 127 mg/dL — ABNORMAL HIGH (ref 70–99)
Glucose-Capillary: 160 mg/dL — ABNORMAL HIGH (ref 70–99)
Glucose-Capillary: 125 mg/dL — ABNORMAL HIGH (ref 70–99)

## 2014-02-24 LAB — POCT I-STAT 3, VENOUS BLOOD GAS (G3P V)
ACID-BASE EXCESS: 5 mmol/L — AB (ref 0.0–2.0)
Acid-Base Excess: 4 mmol/L — ABNORMAL HIGH (ref 0.0–2.0)
BICARBONATE: 32.4 meq/L — AB (ref 20.0–24.0)
Bicarbonate: 31.1 mEq/L — ABNORMAL HIGH (ref 20.0–24.0)
O2 SAT: 65 %
O2 Saturation: 63 %
PCO2 VEN: 58.1 mmHg — AB (ref 45.0–50.0)
PH VEN: 7.354 — AB (ref 7.250–7.300)
TCO2: 33 mmol/L (ref 0–100)
TCO2: 34 mmol/L (ref 0–100)
pCO2, Ven: 55.7 mmHg — ABNORMAL HIGH (ref 45.0–50.0)
pH, Ven: 7.355 — ABNORMAL HIGH (ref 7.250–7.300)
pO2, Ven: 35 mmHg (ref 30.0–45.0)
pO2, Ven: 36 mmHg (ref 30.0–45.0)

## 2014-02-24 LAB — MAGNESIUM: MAGNESIUM: 2 mg/dL (ref 1.5–2.5)

## 2014-02-24 SURGERY — LEFT AND RIGHT HEART CATHETERIZATION WITH CORONARY/GRAFT ANGIOGRAM
Anesthesia: LOCAL

## 2014-02-24 MED ORDER — ONDANSETRON 4 MG PO TBDP
4.0000 mg | ORAL_TABLET | Freq: Three times a day (TID) | ORAL | Status: DC | PRN
Start: 2014-02-24 — End: 2014-02-25
  Administered 2014-02-24 (×2): 4 mg via ORAL
  Filled 2014-02-24 (×2): qty 1

## 2014-02-24 MED ORDER — ONDANSETRON HCL 4 MG/2ML IJ SOLN
INTRAMUSCULAR | Status: AC
  Filled 2014-02-24: qty 2

## 2014-02-24 MED ORDER — NITROGLYCERIN 0.2 MG/ML ON CALL CATH LAB
INTRAVENOUS | Status: AC
  Filled 2014-02-24: qty 1

## 2014-02-24 MED ORDER — PH 12 STERILE DILUENT: 38.3100 ng/kg/min | INTRAVENOUS | Status: DC

## 2014-02-24 MED ORDER — SODIUM CHLORIDE 0.9 % IV SOLN: INTRAVENOUS | Status: AC

## 2014-02-24 MED ORDER — ENOXAPARIN SODIUM 40 MG/0.4ML ~~LOC~~ SOLN
40.0000 mg | SUBCUTANEOUS | Status: DC
  Administered 2014-02-25: 40 mg via SUBCUTANEOUS
  Filled 2014-02-24 (×2): qty 0.4

## 2014-02-24 MED ORDER — CEFUROXIME AXETIL 500 MG PO TABS
500.0000 mg | ORAL_TABLET | Freq: Two times a day (BID) | ORAL | Status: DC
  Administered 2014-02-24 – 2014-02-25 (×2): 500 mg via ORAL
  Filled 2014-02-24 (×4): qty 1

## 2014-02-24 MED ORDER — LIDOCAINE HCL (PF) 1 % IJ SOLN
INTRAMUSCULAR | Status: AC
  Filled 2014-02-24: qty 30

## 2014-02-24 MED ORDER — HEPARIN (PORCINE) IN NACL 2-0.9 UNIT/ML-% IJ SOLN
INTRAMUSCULAR | Status: AC
  Filled 2014-02-24: qty 1000

## 2014-02-24 MED ORDER — PANTOPRAZOLE SODIUM 40 MG PO TBEC: 40.0000 mg | DELAYED_RELEASE_TABLET | Freq: Every day | ORAL | Status: DC

## 2014-02-24 MED ORDER — CALCIUM CARBONATE ANTACID 500 MG PO CHEW
1.0000 | CHEWABLE_TABLET | Freq: Two times a day (BID) | ORAL | Status: DC | PRN
  Administered 2014-02-24: 200 mg via ORAL
  Filled 2014-02-24: qty 1

## 2014-02-24 MED ORDER — FENTANYL CITRATE 0.05 MG/ML IJ SOLN
INTRAMUSCULAR | Status: AC
  Filled 2014-02-24: qty 2

## 2014-02-24 MED ORDER — PROMETHAZINE HCL 25 MG/ML IJ SOLN: 12.5000 mg | Freq: Four times a day (QID) | INTRAMUSCULAR | Status: DC | PRN

## 2014-02-24 MED ORDER — ONDANSETRON HCL 4 MG/2ML IJ SOLN
4.0000 mg | Freq: Once | INTRAMUSCULAR | Status: AC
  Administered 2014-02-24: 4 mg via INTRAVENOUS

## 2014-02-24 MED ORDER — ONDANSETRON HCL 4 MG/2ML IJ SOLN: 4.0000 mg | INTRAMUSCULAR | Status: DC

## 2014-02-24 MED ORDER — MIDAZOLAM HCL 2 MG/2ML IJ SOLN
INTRAMUSCULAR | Status: AC
  Filled 2014-02-24: qty 2

## 2014-02-24 NOTE — Clinical Documentation Improvement (Signed)
Please clarify respiratory status. Thank you Possible Clinical Conditions?  Acute Respiratory Failure Acute on Chronic Respiratory Failure Chronic Respiratory Failure Other Condition Cannot Clinically Determine   Supporting Information: Risk Factors: Admitted w/CHF and PNA History of restrictive lung disease, CAD, in addition to CHF "oxygen dependent on 4- 6 L nasal cannula at home",  Signs & Symptoms: shortness of breath, wheezing, chills, cold sweats, loss of energy, with jaw pain on exertion for the past 2 weeks that has been gradually getting worse  Diagnostics: O2 sats 96-100% on 4-6L/m via [place]  Treatment: O2 4-6L/m via [place] Pulse ox w/VS  Thank You, Estella Husk ,RN Clinical Documentation Specialist:  [facility] [employer] [facility]

## 2014-02-24 NOTE — Consult Note (Addendum)
WOC wound consult note Reason for Consult: Consult requested for buttocks wounds.  Husband is at bedside and states he is the caregiver at home.  Pt previously had wounds to buttocks which were assessed by her primary physician as an outpatient and Silvadene was prescribed. Husband is well informed regarding pressure ulcer etiology, topical treatment, and preventive measures. Wound type: Assessed previous wound locations in 2 areas near buttocks with husband at bedside; currently these are 100% pink dry scar tissue sites which have healed. No further open wounds or drainage requiring Silvadene.  Pt is currently incontinent and skin is moist. Dressing procedure/placement/frequency: Barrier cream to protect and promote healing. Please re-consult if further assistance is needed.  Thank-you,  Julien Girt MSN, [place], [place], [place], [place]

## 2014-02-24 NOTE — Progress Notes (Signed)
Advanced Heart Failure Rounding Note   Subjective:   Meagan [name] is a 68 y/o woman with multiple medical problems including CAD s/p CABG and RCA stent, DM2, obesity, fibromyalgia, diastolic HF. She has severe PAH and is on Flolan 38.3 ng/kg/min followed by Dr. Gilles Chiquito at [facility]. Presents through ER for several week h/o increasing jaw pain and DOE. CEs negative.   Yesterday she received 80 mg IV lasix and started on Azithromycin/Rocephin for CAP.  Weight up 1 pound. Jaw pain decreased. Breathing improved.    Objective:   Weight Range:  Vital Signs:   Temp:  [98 F (36.7 C)-98.7 F (37.1 C)] 98.6 F (37 C) (05/06 0800) Pulse Rate:  [80-112] 80 (05/06 0800) Resp:  [10-26] 22 (05/06 0800) BP: (94-160)/(28-64) 120/54 mmHg (05/06 0800) SpO2:  [92 %-100 %] 99 % (05/06 0800) Weight:  [155 lb (70.308 kg)-156 lb 8.4 oz (71 kg)] 156 lb 8.4 oz (71 kg) (05/06 0458) Last BM Date: 02/22/14  Weight change: Filed Weights   02/23/14 1813 02/24/14 0458  Weight: 155 lb (70.308 kg) 156 lb 8.4 oz (71 kg)    Intake/Output:   Intake/Output Summary (Last 24 hours) at 02/24/14 0829 Last data filed at 02/24/14 0700  Gross per 24 hour  Intake 1107.46 ml  Output   1850 ml  Net -742.54 ml     Physical Exam: General: No distress. Wearing 5-6L [place] O2  HEENT: normal  Neck: thick . JVP ~8-9. Carotids 2+ bilat; no bruits. No lymphadenopathy or thryomegaly appreciated.  Cor: PMI nonpalpable Regular rate & rhythm. 2/6 TR. P2 perhaps mildly accentuated but not severely so  Hickman catheter ok  Lungs: clear but decreased throughout no wheezing on 6 liters [place]  Abdomen: obese. soft, nontender, nondistended. No hepatosplenomegaly. No bruits or masses. Good bowel sounds.  Extremities: no cyanosis, clubbing, macular-papular erythematous rash on LEs, no edema  Neuro: alert & oriented x 3, cranial nerves grossly intact. moves all 4 extremities w/o difficulty. Affect pleasant.     Telemetry: SR  70s  Labs: Basic Metabolic Panel:  Recent Labs Lab 02/23/14 1006 02/23/14 1830 02/24/14 0310  NA 140 138 142  K 4.1 4.3 4.1  CL 96 95* 98  CO2 32 31 33*  GLUCOSE 236* 168* 143*  BUN 14 13 13   CREATININE 0.98 0.85 0.82  CALCIUM 9.2 9.1 8.8  MG  --  2.0 2.0    Liver Function Tests:  Recent Labs Lab 02/23/14 1006  AST 10  ALT 10  ALKPHOS 141*  BILITOT 0.2*  PROT 6.6  ALBUMIN 3.2*   No results found for this basename: LIPASE, AMYLASE,  in the last 168 hours No results found for this basename: AMMONIA,  in the last 168 hours  CBC:  Recent Labs Lab 02/23/14 1006 02/23/14 1830 02/24/14 0310  WBC 12.6* 14.8* 12.0*  HGB 10.3* 9.9* 10.1*  HCT 31.7* 30.0* 31.4*  MCV 90.1 89.6 89.7  PLT 285 293 284    Cardiac Enzymes: No results found for this basename: CKTOTAL, CKMB, CKMBINDEX, TROPONINI,  in the last 168 hours  BNP: BNP (last 3 results)  Recent Labs  02/23/14 1006  PROBNP 803.2*     Other results:  EKG:   Imaging: Dg Chest 2 View  02/23/2014   CLINICAL DATA:  Shortness of breath.  EXAM: CHEST  2 VIEW  COMPARISON:  07/22/2012 and prior chest radiographs  FINDINGS: Cardiomegaly and CABG changes noted.  A venous catheter with tip overlying the cavoatrial junction again  noted.  Patchy left lower lobe opacity may represent atelectasis or airspace disease/ pneumonia.  Chronic pulmonary opacities and peribronchial thickening again noted.  There is no evidence of pleural effusion or pneumothorax.  IMPRESSION: Patchy left lower lobe opacity which may be related to chronic changes/atelectasis but pneumonia is not excluded.  Cardiomegaly and CABG changes.   Electronically Signed   By: Hassan Rowan M.D.   On: 02/23/2014 10:52      Medications:     Scheduled Medications: . azithromycin  500 mg Intravenous Q24H  . cefTRIAXone (ROCEPHIN)  IV  1 g Intravenous Q24H  . clopidogrel  75 mg Oral Q breakfast  . DULoxetine  60 mg Oral Daily  . ezetimibe-simvastatin  1  tablet Oral QHS  . heparin  5,000 Units Subcutaneous 3 times per day  . insulin aspart  0-15 Units Subcutaneous TID WC  . isosorbide mononitrate  90 mg Oral q morning - 10a  . levothyroxine  25 mcg Oral QAC breakfast  . metoprolol succinate  100 mg Oral q morning - 10a  . ondansetron (ZOFRAN) IV  4 mg Intravenous Pre-Cath  . pantoprazole  40 mg Oral Daily  . sodium chloride  3 mL Intravenous Q12H  . sodium chloride  3 mL Intravenous Q12H     Infusions: . sodium chloride 10 mL/hr (02/24/14 0600)  . epoprostenol       PRN Medications:  sodium chloride, acetaminophen, acetaminophen, HYDROmorphone, nitroGLYCERIN, ondansetron (ZOFRAN) IV, ondansetron, sodium chloride   Assessment:   1. Chest pain - ? Progressive angina  2. CAD s/p CABG and previous RCA stent  3. PAH on Flolan  4. A/c diastolic HF  5. LLL opacity - ? CAP  6. DM2   Plan/Discussion:    Plan for RHC/LHC today to reassess hemodynamics and coronaries. Continue Flolan for severe PAH. May need additional lasix post cath. Give IV zofran on call to cath lab for nausea she has had with caths.   Length of Stay: 1  Amy D Clegg 02/24/2014, 8:29 AM  Advanced Heart Failure Team Pager [phone] (M-F; 7a - 4p)  Please contact [facility] for night-coverage after hours (4p -7a ) and weekends on [email or web address]  Patient seen and examined with Darrick Grinder, NP. We discussed all aspects of the encounter. I agree with the assessment and plan as stated above.   Has only had mild diuresis but symptomatically improved. CE negative. Will plan R and L cath today. Continue Flolan for Meagan [name]  Nithila Sumners R Shakara Tweedy,MD 8:52 AM

## 2014-02-24 NOTE — Progress Notes (Signed)
Patient seen and examined. Dr. Laroy Apple note reviewed and I agree with the documentation as outlined. Patient states breathing has improved a little. No CP, no fevers/chills, no cough  Cardiovascular- Norma heart sounds, regular rate and rhythm  Lungs- bibasilar crackles improving  Abdomen- soft, non tender, bowel sounds +  No lower extremity edema   Acute on chronic diastolic heart failure, CAD with probable angina  - consider start PO lasix today. Currently s/p 1 dose of IV lasix yesterday. Strict I's and O's  - Patient for cath today as per cardio. Will follow up the results  - Would continue with current cardiac meds    Possible Pneumonia  - Chest X ray with possible infiltrate v/s atelectasis  -  On IV ceftriaxone,azithromycin for now  - Monitor O2 sats. Would d/c IV antibiotics and start PO ceftin   PAH  - continue with Flolan for now  - Outpatient follow up with her pulmonologist at [place] monitor CBGs  - Lantus on hold. Continue with insulin coverage for now

## 2014-02-24 NOTE — H&P (View-Only) (Signed)
Advanced Heart Failure Rounding Note   Subjective:   [name] is a 68 y/o woman with multiple medical problems including CAD s/p CABG and RCA stent, DM2, obesity, fibromyalgia, diastolic HF. She has severe PAH and is on Flolan 38.3 ng/kg/min followed by Dr. Gilles Chiquito at [facility]. Presents through ER for several week h/o increasing jaw pain and DOE. CEs negative.   Yesterday she received 80 mg IV lasix and started on Azithromycin/Rocephin for CAP.  Weight up 1 pound. Jaw pain decreased. Breathing improved.    Objective:   Weight Range:  Vital Signs:   Temp:  [98 F (36.7 C)-98.7 F (37.1 C)] 98.6 F (37 C) (05/06 0800) Pulse Rate:  [80-112] 80 (05/06 0800) Resp:  [10-26] 22 (05/06 0800) BP: (94-160)/(28-64) 120/54 mmHg (05/06 0800) SpO2:  [92 %-100 %] 99 % (05/06 0800) Weight:  [155 lb (70.308 kg)-156 lb 8.4 oz (71 kg)] 156 lb 8.4 oz (71 kg) (05/06 0458) Last BM Date: 02/22/14  Weight change: Filed Weights   02/23/14 1813 02/24/14 0458  Weight: 155 lb (70.308 kg) 156 lb 8.4 oz (71 kg)    Intake/Output:   Intake/Output Summary (Last 24 hours) at 02/24/14 0829 Last data filed at 02/24/14 0700  Gross per 24 hour  Intake 1107.46 ml  Output   1850 ml  Net -742.54 ml     Physical Exam: General: No distress. Wearing 5-6L [place] O2  HEENT: normal  Neck: thick . JVP ~8-9. Carotids 2+ bilat; no bruits. No lymphadenopathy or thryomegaly appreciated.  Cor: PMI nonpalpable Regular rate & rhythm. 2/6 TR. P2 perhaps mildly accentuated but not severely so  Hickman catheter ok  Lungs: clear but decreased throughout no wheezing on 6 liters [place]  Abdomen: obese. soft, nontender, nondistended. No hepatosplenomegaly. No bruits or masses. Good bowel sounds.  Extremities: no cyanosis, clubbing, macular-papular erythematous rash on LEs, no edema  Neuro: alert & oriented x 3, cranial nerves grossly intact. moves all 4 extremities w/o difficulty. Affect pleasant.     Telemetry: SR  70s  Labs: Basic Metabolic Panel:  Recent Labs Lab 02/23/14 1006 02/23/14 1830 02/24/14 0310  NA 140 138 142  K 4.1 4.3 4.1  CL 96 95* 98  CO2 32 31 33*  GLUCOSE 236* 168* 143*  BUN 14 13 13   CREATININE 0.98 0.85 0.82  CALCIUM 9.2 9.1 8.8  MG  --  2.0 2.0    Liver Function Tests:  Recent Labs Lab 02/23/14 1006  AST 10  ALT 10  ALKPHOS 141*  BILITOT 0.2*  PROT 6.6  ALBUMIN 3.2*   No results found for this basename: LIPASE, AMYLASE,  in the last 168 hours No results found for this basename: AMMONIA,  in the last 168 hours  CBC:  Recent Labs Lab 02/23/14 1006 02/23/14 1830 02/24/14 0310  WBC 12.6* 14.8* 12.0*  HGB 10.3* 9.9* 10.1*  HCT 31.7* 30.0* 31.4*  MCV 90.1 89.6 89.7  PLT 285 293 284    Cardiac Enzymes: No results found for this basename: CKTOTAL, CKMB, CKMBINDEX, TROPONINI,  in the last 168 hours  BNP: BNP (last 3 results)  Recent Labs  02/23/14 1006  PROBNP 803.2*     Other results:  EKG:   Imaging: Dg Chest 2 View  02/23/2014   CLINICAL DATA:  Shortness of breath.  EXAM: CHEST  2 VIEW  COMPARISON:  07/22/2012 and prior chest radiographs  FINDINGS: Cardiomegaly and CABG changes noted.  A venous catheter with tip overlying the cavoatrial junction again  noted.  Patchy left lower lobe opacity may represent atelectasis or airspace disease/ pneumonia.  Chronic pulmonary opacities and peribronchial thickening again noted.  There is no evidence of pleural effusion or pneumothorax.  IMPRESSION: Patchy left lower lobe opacity which may be related to chronic changes/atelectasis but pneumonia is not excluded.  Cardiomegaly and CABG changes.   Electronically Signed   By: Hassan Rowan M.D.   On: 02/23/2014 10:52      Medications:     Scheduled Medications: . azithromycin  500 mg Intravenous Q24H  . cefTRIAXone (ROCEPHIN)  IV  1 g Intravenous Q24H  . clopidogrel  75 mg Oral Q breakfast  . DULoxetine  60 mg Oral Daily  . ezetimibe-simvastatin  1  tablet Oral QHS  . heparin  5,000 Units Subcutaneous 3 times per day  . insulin aspart  0-15 Units Subcutaneous TID WC  . isosorbide mononitrate  90 mg Oral q morning - 10a  . levothyroxine  25 mcg Oral QAC breakfast  . metoprolol succinate  100 mg Oral q morning - 10a  . ondansetron (ZOFRAN) IV  4 mg Intravenous Pre-Cath  . pantoprazole  40 mg Oral Daily  . sodium chloride  3 mL Intravenous Q12H  . sodium chloride  3 mL Intravenous Q12H     Infusions: . sodium chloride 10 mL/hr (02/24/14 0600)  . epoprostenol       PRN Medications:  sodium chloride, acetaminophen, acetaminophen, HYDROmorphone, nitroGLYCERIN, ondansetron (ZOFRAN) IV, ondansetron, sodium chloride   Assessment:   1. Chest pain - ? Progressive angina  2. CAD s/p CABG and previous RCA stent  3. PAH on Flolan  4. A/c diastolic HF  5. LLL opacity - ? CAP  6. DM2   Plan/Discussion:    Plan for RHC/LHC today to reassess hemodynamics and coronaries. Continue Flolan for severe PAH. May need additional lasix post cath. Give IV zofran on call to cath lab for nausea she has had with caths.   Length of Stay: 1  Meagan [name] 02/24/2014, 8:29 AM  Advanced Heart Failure Team Pager [phone] (M-F; 7a - 4p)  Please contact [facility] for night-coverage after hours (4p -7a ) and weekends on [email or web address]  Patient seen and examined with Darrick Grinder, NP. We discussed all aspects of the encounter. I agree with the assessment and plan as stated above.   Has only had mild diuresis but symptomatically improved. CE negative. Will plan R and L cath today. Continue Flolan for [place]  Thresea Doble R Adya Wirz,MD 8:52 AM

## 2014-02-24 NOTE — Progress Notes (Addendum)
Subjective:  Meagan [name]. Breathing better. Jaw pain improved.  Objective: Vital signs in last 24 hours: Filed Vitals:   02/24/14 0500 02/24/14 0600 02/24/14 0700 02/24/14 0800  BP: 139/64 137/53  120/54  Pulse: 80 86 82 80  Temp:    98.6 F (37 C)  TempSrc:    Oral  Resp: 18 21 18 22   Weight:      SpO2: 100% 96% 100% 99%   Weight change:   Intake/Output Summary (Last 24 hours) at 02/24/14 1023 Last data filed at 02/24/14 0800  Gross per 24 hour  Intake 1130.63 ml  Output   1950 ml  Net -819.37 ml   Physical Exam  Constitutional: She is oriented to person, place, and time. She appears well-developed and well-nourished. No distress.  HENT:  Head: Normocephalic and atraumatic.  Cardiovascular: Normal rate, regular rhythm and intact distal pulses.   Murmur heard. Pulmonary/Chest: Effort normal. No respiratory distress. She has no wheezes. She has rales.  Abdominal: Soft. Bowel sounds are normal. She exhibits no distension. There is tenderness. There is no rebound and no guarding.  Neurological: She is alert and oriented to person, place, and time.  Skin: She is not diaphoretic.  Psychiatric: She has a normal mood and affect. Her behavior is normal.    Lab Results: Basic Metabolic Panel:  Recent Labs Lab 02/23/14 1830 02/24/14 0310  NA 138 142  K 4.3 4.1  CL 95* 98  CO2 31 33*  GLUCOSE 168* 143*  BUN 13 13  CREATININE 0.85 0.82  CALCIUM 9.1 8.8  MG 2.0 2.0   Liver Function Tests:  Recent Labs Lab 02/23/14 1006  AST 10  ALT 10  ALKPHOS 141*  BILITOT 0.2*  PROT 6.6  ALBUMIN 3.2*   CBC:  Recent Labs Lab 02/23/14 1830 02/24/14 0310  WBC 14.8* 12.0*  HGB 9.9* 10.1*  HCT 30.0* 31.4*  MCV 89.6 89.7  PLT 293 284   BNP:  Recent Labs Lab 02/23/14 1006  PROBNP 803.2*   CBG:  Recent Labs Lab 02/23/14 1707 02/23/14 2159 02/24/14 0318 02/24/14 0822  GLUCAP 257* 122* 134* 127*   Coagulation:  Recent Labs Lab 02/23/14 1830  LABPROT 12.1    INR 0.91   Micro Results: Recent Results (from the past 240 hour(s))  MRSA PCR SCREENING     Status: None   Collection Time    02/23/14  4:08 PM      Result Value Ref Range Status   MRSA by PCR NEGATIVE  NEGATIVE Final   Comment:            The GeneXpert MRSA Assay (FDA     approved for NASAL specimens     only), is one component of a     comprehensive MRSA colonization     surveillance program. It is not     intended to diagnose MRSA     infection nor to guide or     monitor treatment for     MRSA infections.   Studies/Results: Dg Chest 2 View  02/23/2014   CLINICAL DATA:  Shortness of breath.  EXAM: CHEST  2 VIEW  COMPARISON:  07/22/2012 and prior chest radiographs  FINDINGS: Cardiomegaly and CABG changes noted.  A venous catheter with tip overlying the cavoatrial junction again noted.  Patchy left lower lobe opacity may represent atelectasis or airspace disease/ pneumonia.  Chronic pulmonary opacities and peribronchial thickening again noted.  There is no evidence of pleural effusion or pneumothorax.  IMPRESSION:  Patchy left lower lobe opacity which may be related to chronic changes/atelectasis but pneumonia is not excluded.  Cardiomegaly and CABG changes.   Electronically Signed   By: Hassan Rowan M.D.   On: 02/23/2014 10:52   Medications: I have reviewed the patient's current medications. Scheduled Meds: . azithromycin  500 mg Intravenous Q24H  . cefTRIAXone (ROCEPHIN)  IV  1 g Intravenous Q24H  . clopidogrel  75 mg Oral Q breakfast  . DULoxetine  60 mg Oral Daily  . ezetimibe-simvastatin  1 tablet Oral QHS  . heparin  5,000 Units Subcutaneous 3 times per day  . insulin aspart  0-15 Units Subcutaneous TID WC  . isosorbide mononitrate  90 mg Oral q morning - 10a  . levothyroxine  25 mcg Oral QAC breakfast  . metoprolol succinate  100 mg Oral q morning - 10a  . ondansetron (ZOFRAN) IV  4 mg Intravenous Pre-Cath  . pantoprazole  40 mg Oral Daily  . sodium chloride  3 mL  Intravenous Q12H  . sodium chloride  3 mL Intravenous Q12H   Continuous Infusions: . sodium chloride 10 mL/hr (02/24/14 0600)  . epoprostenol     PRN Meds:.sodium chloride, acetaminophen, acetaminophen, HYDROmorphone, nitroGLYCERIN, ondansetron (ZOFRAN) IV, ondansetron, sodium chloride Assessment/Plan: Active Problems:   DIABETES MELLITUS, TYPE II, CONTROLLED   PULMONARY HYPERTENSION   Acute on chronic diastolic heart failure   Nausea   Pneumonia   CAP (community acquired pneumonia)  Acute on Chronic Heart Failure  The patient has a history of dCHF, CAD s/p CABG and RCA stenting, and PAH. She is treated with flolan for PAH. She is taking Metoprolol and Imdur as well. Her heart failure is managed by Dr. Haroldine Laws as outpatient. He was consulted at this admission and will be managing her acute on chronic heart failure.  - Plan for IV diuresis with lasix by heart failure team. (80 mg in ED), 40 meq K in ED.  - Mag BMP BID  - monitor I/O strict, daily weight  - Cath per Dr. Haroldine Laws on 5/6   Pneumonia  The patient possibly has a pneumonia given SOB cough and concerning CXR. Plan to treat with empiric ABX (azithro ceftriaxone) for CAP. Patient had QTc of 520 on arrival to ED. Repeat was 500. Plan to avoid fluorquinolones due to QT prolongation (patient expected to be given zofran post cath). Will continue ceftriaxone and azithro today.  [place]  Patient on flolan. Appears to be have dose limiting side effect of jaw claudication. Patient reports eating well despite this pain. Pharmacy was made aware of the flolan. Patients husband will provide [place] with flolan from home.  - Flolan will be managed by Dr. Haroldine Laws.   Nausea  The patients nausea is unknown etiology at this time. No vommitting, bloody bowel movement of melena. Plan to treat symptomatically.  - d/c zofran and give phenergan give QTc prolongation. Patient refused phenergan stating it makes her crazy. She states that zofran is the  only thing that works for her. I elected to restart ODT zofran.  DM  Hold home lantus  SSI   Anxiety  Xanax 0.5 mg prn BID   Fibromyalgia  Appears stable. Continue home meds (Cymbalta and Dilaudid 4 mg PO q 4 hr prn)   GERD  Continue home PPI   History of Skin Breakdown on Buttocks  Consult wound care for recs. On silvadene as outpatient.  - will appreciate rec's  Dyslipidemia  Appears stable. Continue home Vytorin.   DVT:  heparin   Diet: Heart Carb Dieat  Dispo: Disposition is deferred at this time, awaiting improvement of current medical problems.  Anticipated discharge in approximately 1-2 day(s).   The patient does have a current PCP Redmond School, MD) and does not need an [facility] hospital follow-up appointment after discharge.  The patient does have transportation limitations that hinder transportation to clinic appointments.  .Services Needed at time of discharge: Y = Yes, Blank = No PT:   OT:   RN:   Equipment:   Other:     LOS: 1 day   Marrion Coy, MD 02/24/2014, 10:23 AM

## 2014-02-24 NOTE — CV Procedure (Addendum)
Cardiac Cath Procedure Note   Indication: Exertional  jaw pain. Severe PAH on Flolan.   Procedures performed:  1) Right heart cathererization  2) Selective coronary angiography  3) Left heart catheterization  4) Left ventriculogram  5) SVG angio  6) LIMA angio   Description of procedure:   The risks and indication of the procedure were explained. Consent was signed and placed on the chart. An appropriate timeout was taken prior to the procedure. The right groin was prepped and draped in the routine sterile fashion and anesthetized with 1% local lidocaine.  The RFA was easily cannulated but we were initially unable to feed a wire through the vessel due to obstruction. We then stuck above the initial puncture site and were able to feed the wire easily. A 5 FR arterial sheath was placed in the right femoral artery using a modified Seldinger technique. Standard catheters including a JL4, JR4 and angled pigtail were used. All catheter exchanges were made over a wire. A 7 FR venous sheath was placed in the right femoral vein using a modified Seldinger technique. A standard Swan-Ganz catheter was used for the procedure.   Complications: None apparent   Total contrast: 90cc   Findings:  RA = 9 RV = 67/5/10 PA = 71/21 (39)  PCW = 13  Fick cardiac output/index = 5.5/3.1  PVR = 4.6 [place]  FA sat = 94%  PA sat = 63%,64%  Ao Pressure: 177/76 (119)  LV Pressure: 169/9/20 There was no signficant gradient across the aortic valve on pullback.   Left main: Heavily calcified. Distal 40-50%   LAD: Diffuse severe disease. Totaled in midsection. Mid to distal vessel fills from LIMA. 2 diagonals with diffuse disease. There is a 70-80% lesion in the midsection of the 2nd diagonal   LCX: Small heavily diseased vessel. 30-40% ostial. Long 95% lesion in mid AV groove after take-off of OM-1. Distal vessel small and diffusely diseased. OM-1 mild plaque. OM-2 and OM-3 occluded   RCA: Ostial spasm. Long  stent in midsection is patent. Distal RCA 40% before PDA. In Mid PDA50% lesion. Mild to moderate plaque in  PLs. R to L collaterals filling portion of LCX and septals  LIMA-LAD: widely patent with 70-75% eccentric lesion in LAD after insertion of LIMA. The apical LAD is occluded.   SVG-OM: totally occluded proximally (chronic)   LV-gram done in the RAO projection: Ejection fraction = 60% No wall motion abnormalities    Assessment:  1. Severe 3v CAD  2. LIMA patent  3. Chronic occlusion of SVG-OM  4. Normal LV function  5. Moderate to severe PAH much improved on Flolan   Plan/Discussion:   Coronary anatomy essentially unchanged from previous. She has several areas for potential ischemia but none are easily revascularizable. Would continue medical therapy. I do not think lesion in LAD after insertion of LIMA is flow-limiting. However, if symptoms continue could consider Myoview to assess for LAD ischemia. [place] much improved with Flolan    Daniel R Bensimhon,MD 2:25 PM

## 2014-02-24 NOTE — Interval H&P Note (Signed)
History and Physical Interval Note:  02/24/2014 1:31 PM  Meagan [name]  has presented today for surgery, with the diagnosis of php  The various methods of treatment have been discussed with the patient and family. After consideration of risks, benefits and other options for treatment, the patient has consented to  Procedure(s): LEFT AND RIGHT HEART CATHETERIZATION WITH CORONARY/GRAFT ANGIOGRAM (N/A) possible Cath Lab Visit (complete for each Cath Lab visit)  Clinical Evaluation Leading to the Procedure:   ACS: no  Non-ACS:    Anginal Classification: CCS IV  Anti-ischemic medical therapy: Minimal Therapy (1 class of medications)  Non-Invasive Test Results: No non-invasive testing performed  Prior CABG: Previous CABG       angioplasty as a surgical intervention .  The patient's history has been reviewed, patient examined, no change in status, stable for surgery.  I have reviewed the patient's chart and labs.  Questions were answered to the patient's satisfaction.     Shaune Pascal Bensimhon

## 2014-02-25 DIAGNOSIS — R21 Rash and other nonspecific skin eruption: Secondary | ICD-10-CM

## 2014-02-25 DIAGNOSIS — I509 Heart failure, unspecified: Secondary | ICD-10-CM

## 2014-02-25 DIAGNOSIS — J9600 Acute respiratory failure, unspecified whether with hypoxia or hypercapnia: Secondary | ICD-10-CM

## 2014-02-25 DIAGNOSIS — I2510 Atherosclerotic heart disease of native coronary artery without angina pectoris: Secondary | ICD-10-CM

## 2014-02-25 LAB — CBC
HEMATOCRIT: 30.6 % — AB (ref 36.0–46.0)
HEMOGLOBIN: 10 g/dL — AB (ref 12.0–15.0)
MCH: 29 pg (ref 26.0–34.0)
MCHC: 32.7 g/dL (ref 30.0–36.0)
MCV: 88.7 fL (ref 78.0–100.0)
Platelets: 306 10*3/uL (ref 150–400)
RBC: 3.45 MIL/uL — ABNORMAL LOW (ref 3.87–5.11)
RDW: 12.8 % (ref 11.5–15.5)
WBC: 10.6 10*3/uL — ABNORMAL HIGH (ref 4.0–10.5)

## 2014-02-25 LAB — BASIC METABOLIC PANEL
BUN: 10 mg/dL (ref 6–23)
CO2: 29 meq/L (ref 19–32)
CREATININE: 0.84 mg/dL (ref 0.50–1.10)
Calcium: 9.2 mg/dL (ref 8.4–10.5)
Chloride: 98 mEq/L (ref 96–112)
GFR calc non Af Amer: 69 mL/min — ABNORMAL LOW (ref >90)
GFR, EST AFRICAN AMERICAN: 80 mL/min — AB (ref >90)
Glucose, Bld: 129 mg/dL — ABNORMAL HIGH (ref 70–99)
Potassium: 4.3 mEq/L (ref 3.7–5.3)
Sodium: 138 mEq/L (ref 137–147)

## 2014-02-25 LAB — GLUCOSE, CAPILLARY
GLUCOSE-CAPILLARY: 125 mg/dL — AB (ref 70–99)
GLUCOSE-CAPILLARY: 156 mg/dL — AB (ref 70–99)

## 2014-02-25 LAB — MAGNESIUM: MAGNESIUM: 2 mg/dL (ref 1.5–2.5)

## 2014-02-25 MED ORDER — FUROSEMIDE 80 MG PO TABS
80.0000 mg | ORAL_TABLET | Freq: Every day | ORAL | Status: DC
  Filled 2014-02-25: qty 1

## 2014-02-25 MED ORDER — CEFUROXIME AXETIL 500 MG PO TABS: 500.0000 mg | ORAL_TABLET | Freq: Two times a day (BID) | ORAL | Status: DC

## 2014-02-25 MED ORDER — ONDANSETRON HCL 4 MG/2ML IJ SOLN
4.0000 mg | Freq: Four times a day (QID) | INTRAMUSCULAR | Status: DC
  Administered 2014-02-25: 4 mg via INTRAVENOUS
  Filled 2014-02-25: qty 2

## 2014-02-25 MED ORDER — ONDANSETRON 4 MG PO TBDP: 4.0000 mg | ORAL_TABLET | Freq: Three times a day (TID) | ORAL | Status: DC | PRN

## 2014-02-25 NOTE — Progress Notes (Signed)
D/c instructions given to patient and verbalizes understanding. No complaints at current time. Patient d/c'd via WC to private vehicle.

## 2014-02-25 NOTE — Progress Notes (Addendum)
Patient seen and examined. Dr. Laroy Apple note reviewed and I agree with the documentation as outlined. Patient states breathing has improved.  She complains of nausea today. States this is a chronic issue and it comes and goes. She believes it to be a side effect of her meds. She also c/o localized erythema around her port site.No CP, no fevers/chills, no cough   Cardiovascular- [place] heart sounds, regular rate and rhythm  Lungs- bibasilar crackles improving  Abdomen- soft, non tender, bowel sounds +  No lower extremity edema  Skin- mild erythema around port, no increased warmth, no tenderness, no discharge  Acute on chronic diastolic heart failure, CAD with probable angina  -  PO lasix resumed today at 80 mg (home dose) - Patient s/p cath yesterday. Pt with unchanged LAD lesion. No intervention at this time  - Would continue with current cardiac meds   Possible Pneumonia  - Chest X ray with possible infiltrate v/s atelectasis  - IV antibiotics dc'd. C/w PO Ceftin to complete 7 day course - Monitor O2 sats.   PAH  - continue with Flolan for now  - Outpatient follow up with her pulmonologist at [place] monitor CBGs  - Lantus on hold. Continue with insulin coverage for now   Erythema around port - Unlikely systemic allergic reaction given localized erythema. Patient does give history of previous erythema which resolved with "ointment" at the same site - Unlikely infectious- no fevers, leukocytosis resolving, no discharge from the site - Patient to get RN from pulmonologists office to examine it and will follow up with Pulm at [facility] if further work up required  Nausea - chronic per patient. Continue with zofran prn for now  Dispo - Pt stable for d/c home today

## 2014-02-25 NOTE — Progress Notes (Signed)
Advanced Heart Failure Rounding Note   Subjective:   Meagan [name] is a 68 y/o woman with multiple medical problems including CAD s/p CABG and RCA stent, DM2, obesity, fibromyalgia, diastolic HF. She has severe PAH and is on Flolan 38.3 ng/kg/min followed by Dr. Gilles Chiquito at [facility]. Presents through ER for several week h/o increasing jaw pain and DOE. CEs negative.   Yesterday she received 80 mg IV lasix and started on Azithromycin/Rocephin for CAP.  Weight up 1 pound. Jaw pain decreased. Breathing back to baseline.    [facility] 02/24/14 RA = 9  RV = 67/5/10  PA = 71/21 (39)  PCW = 13  Fick cardiac output/index = 5.5/3.1  PVR = 4.6 Woods  FA sat = 94%  PA sat = 63%,64%  Ao Pressure: 177/76 (119)     Objective:   Weight Range:  Vital Signs:   Temp:  [97.9 F (36.6 C)-99.4 F (37.4 C)] 98.3 F (36.8 C) (05/07 0832) Pulse Rate:  [83-96] 86 (05/07 0228) Resp:  [17-30] 20 (05/07 0832) BP: (118-167)/(19-86) 140/46 mmHg (05/07 0832) SpO2:  [97 %-100 %] 100 % (05/07 0832) Weight:  [150 lb 5.7 oz (68.2 kg)] 150 lb 5.7 oz (68.2 kg) (05/07 0655) Last BM Date: 02/24/14  Weight change: Filed Weights   02/23/14 1813 02/24/14 0458 02/25/14 0655  Weight: 155 lb (70.308 kg) 156 lb 8.4 oz (71 kg) 150 lb 5.7 oz (68.2 kg)    Intake/Output:   Intake/Output Summary (Last 24 hours) at 02/25/14 0846 Last data filed at 02/25/14 0600  Gross per 24 hour  Intake 530.32 ml  Output   1100 ml  Net -569.68 ml     Physical Exam: General: No distress. Wearing 5-6L [place] O2 Husband present  HEENT: normal  Neck: thick . JVP ~8-9. Carotids 2+ bilat; no bruits. No lymphadenopathy or thryomegaly appreciated.  Cor: PMI nonpalpable Regular rate & rhythm. 2/6 TR. P2 perhaps mildly accentuated but not severely so  Hickman catheter ok  Lungs: clear but decreased throughout no wheezing on 6 liters [place]  Abdomen: obese. soft, nontender, nondistended. No hepatosplenomegaly. No bruits or masses. Good bowel sounds.   Extremities: no cyanosis, clubbing, macular-papular erythematous rash on LEs, no edema. R groin site ok.  Neuro: alert & oriented x 3, cranial nerves grossly intact. moves all 4 extremities w/o difficulty. Affect pleasant.    Telemetry: SR 80s  Labs: Basic Metabolic Panel:  Recent Labs Lab 02/23/14 1006 02/23/14 1830 02/24/14 0310 02/25/14 0330  NA 140 138 142 138  K 4.1 4.3 4.1 4.3  CL 96 95* 98 98  CO2 32 31 33* 29  GLUCOSE 236* 168* 143* 129*  BUN 14 13 13 10   CREATININE 0.98 0.85 0.82 0.84  CALCIUM 9.2 9.1 8.8 9.2  MG  --  2.0 2.0 2.0    Liver Function Tests:  Recent Labs Lab 02/23/14 1006  AST 10  ALT 10  ALKPHOS 141*  BILITOT 0.2*  PROT 6.6  ALBUMIN 3.2*   No results found for this basename: LIPASE, AMYLASE,  in the last 168 hours No results found for this basename: AMMONIA,  in the last 168 hours  CBC:  Recent Labs Lab 02/23/14 1006 02/23/14 1830 02/24/14 0310 02/25/14 0330  WBC 12.6* 14.8* 12.0* 10.6*  HGB 10.3* 9.9* 10.1* 10.0*  HCT 31.7* 30.0* 31.4* 30.6*  MCV 90.1 89.6 89.7 88.7  PLT 285 293 284 306    Cardiac Enzymes: No results found for this basename: CKTOTAL, CKMB, CKMBINDEX, TROPONINI,  in the last 168 hours  BNP: BNP (last 3 results)  Recent Labs  02/23/14 1006  PROBNP 803.2*     Other results:  EKG:   Imaging: Dg Chest 2 View  02/23/2014   CLINICAL DATA:  Shortness of breath.  EXAM: CHEST  2 VIEW  COMPARISON:  07/22/2012 and prior chest radiographs  FINDINGS: Cardiomegaly and CABG changes noted.  A venous catheter with tip overlying the cavoatrial junction again noted.  Patchy left lower lobe opacity may represent atelectasis or airspace disease/ pneumonia.  Chronic pulmonary opacities and peribronchial thickening again noted.  There is no evidence of pleural effusion or pneumothorax.  IMPRESSION: Patchy left lower lobe opacity which may be related to chronic changes/atelectasis but pneumonia is not excluded.  Cardiomegaly  and CABG changes.   Electronically Signed   By: Hassan Rowan M.D.   On: 02/23/2014 10:52     Medications:     Scheduled Medications: . cefUROXime  500 mg Oral BID WC  . clopidogrel  75 mg Oral Q breakfast  . DULoxetine  60 mg Oral Daily  . enoxaparin (LOVENOX) injection  40 mg Subcutaneous Q24H  . ezetimibe-simvastatin  1 tablet Oral QHS  . insulin aspart  0-15 Units Subcutaneous TID WC  . isosorbide mononitrate  90 mg Oral q morning - 10a  . levothyroxine  25 mcg Oral QAC breakfast  . metoprolol succinate  100 mg Oral q morning - 10a  . pantoprazole  40 mg Oral Daily  . sodium chloride  3 mL Intravenous Q12H    Infusions: . epoprostenol      PRN Medications: acetaminophen, acetaminophen, calcium carbonate, HYDROmorphone, nitroGLYCERIN, ondansetron   Assessment:   1. Chest pain - ? Progressive angina  2. CAD s/p CABG and previous RCA stent  3. PAH on Flolan  4. A/c diastolic HF  5. LLL opacity - ? CAP  6. DM2 7. Acute on chronic respiratory failure  Plan/Discussion:   Yesterday she had RHC/LHC with coronaries essentially unchanged and improved PAH noted. If symptoms continue could consider Myoview to assess for LAD ischemia.   Continue Flolan per [facility]. Restart home diuretics which is lasix 80 mg daily . Marland Kitchen   Follow up in HF clinic May 14 at 11:00  Length of Stay: 2  Amy D Clegg NP-C  02/25/2014, 8:46 AM  Advanced Heart Failure Team Pager [phone] (M-F; 7a - 4p)  Please contact [facility] for night-coverage after hours (4p -7a ) and weekends on [email or web address]  Patient seen and examined with Darrick Grinder, NP. We discussed all aspects of the encounter. I agree with the assessment and plan as stated above.   Results of cath reviewed with her and her husband. Essentially stable. She does have LAD lesion which is borderline but completely unchanged since last cath so I doubt this is source of jaw pain. Suspect jaw pain related to Flolan. Has f/u with Dr. Gilles Chiquito at [facility]  later this month to discuss dose. Treatment of PNA per primary team.  She is stable for d/c from our standpoint. Has f/u with me in Clinic.  Meagan Pascal Tayllor Breitenstein,MD 10:28 AM

## 2014-02-25 NOTE — Discharge Instructions (Signed)

## 2014-02-25 NOTE — Discharge Summary (Signed)
Name: Meagan [name] MRN: [id number] DOB: 04-15-1946 68 y.o. PCP: Redmond School, MD  Date of Admission: 02/23/2014 11:15 AM Date of Discharge: 02/25/2014 Attending Physician: Aldine Contes, MD  Discharge Diagnosis:  Active Problems:   DIABETES MELLITUS, TYPE II, CONTROLLED   PULMONARY HYPERTENSION   Acute on chronic diastolic heart failure   Nausea   Pneumonia   CAP (community acquired pneumonia)  Discharge Medications:   Medication List    STOP taking these medications       silver sulfADIAZINE 1 % cream  Commonly known as:  SILVADENE      TAKE these medications       ALEVE 220 MG tablet  Generic drug:  naproxen sodium  Take 440 mg by mouth daily as needed. *Takes only as needed when other pain medications are not used*     ALPRAZolam 0.5 MG tablet  Commonly known as:  XANAX  Take 0.5 mg by mouth 2 (two) times daily as needed. For sleep. *Prescribed one tablet three times daily as needed*     cefUROXime 500 MG tablet  Commonly known as:  CEFTIN  Take 1 tablet (500 mg total) by mouth 2 (two) times daily with a meal.     clopidogrel 75 MG tablet  Commonly known as:  PLAVIX  Take 75 mg by mouth every morning.     cyclobenzaprine 10 MG tablet  Commonly known as:  FLEXERIL  Take 10 mg by mouth 2 (two) times daily as needed for muscle spasms.     DULoxetine 60 MG capsule  Commonly known as:  CYMBALTA  Take 60 mg by mouth every morning.     FLOLAN IV  Inject into the vein. 1.5 mg. Inject into the vein continuously. Infuse mg/kg/min via cont IV pump.     furosemide 40 MG tablet  Commonly known as:  LASIX  Take 80 mg by mouth daily.     HYDROmorphone 2 MG tablet  Commonly known as:  DILAUDID  Take 4 mg by mouth every 4 (four) hours as needed for severe pain.     insulin glargine 100 UNIT/ML injection  Commonly known as:  LANTUS  Inject 26 Units into the skin every morning.     isosorbide mononitrate 60 MG 24 hr tablet  Commonly known as:  IMDUR    Take 90 mg by mouth every morning.     levothyroxine 25 MCG tablet  Commonly known as:  SYNTHROID, LEVOTHROID  Take 25 mcg by mouth every morning.     metoprolol succinate 100 MG 24 hr tablet  Commonly known as:  TOPROL-XL  Take 100 mg by mouth every morning.     nitroGLYCERIN 0.4 MG SL tablet  Commonly known as:  NITROSTAT  Place 0.4 mg under the tongue every 5 (five) minutes as needed for chest pain.     omeprazole 20 MG capsule  Commonly known as:  PRILOSEC  Take 20 mg by mouth 2 (two) times daily.     ondansetron 4 MG disintegrating tablet  Commonly known as:  ZOFRAN-ODT  Take 1 tablet (4 mg total) by mouth every 8 (eight) hours as needed for nausea or vomiting.     ondansetron 8 MG tablet  Commonly known as:  ZOFRAN  Take 8 mg by mouth every 8 (eight) hours as needed. For nausea     Oxygen Perme[employer] Soln  Inhale into the lungs continuous. 6 liters     potassium chloride SA 20 MEQ tablet  Commonly known as:  K-DUR,KLOR-CON  Take 40 mEq by mouth daily.     traMADol 50 MG tablet  Commonly known as:  ULTRAM  Take 50 mg by mouth every 3 (three) hours as needed. For pain. *takes with Dilaudid (Hydromorphone)     VYTORIN 10-40 MG per tablet  Generic drug:  ezetimibe-simvastatin  TAKE ONE TABLET AT BEDTIME        Disposition and follow-up:   Meagan [name] was discharged from [facility] in [place] condition.  At the hospital follow up visit please address:  1.  Rash at port site, Jaw Claudication, Heart Failure, PNA resolution, Pulm Art HTN  2.  Labs / imaging needed at time of follow-up: BMP, CXR  3.  Pending labs/ test needing follow-up: None  Follow-up Appointments:     Follow-up Information   Follow up with Glori Bickers, MD On 03/04/2014. (at 11:00. Garage Code [id number])    Specialty:  Cardiology   Contact information:   [address] [place] [address] [phone]       Follow up with  Glo Herring., MD On 03/08/2014. (1:30 pm)    Specialty:  Internal Medicine   Contact information:   [id number] [place] [address] [phone]       Follow up with Rich Reining, MD On 03/09/2014. (10 am)    Specialty:  Internal Medicine      Discharge Instructions: Discharge Orders   Future Appointments Provider Department Dept Phone   03/01/2014 9:00 AM [place] [phone]   03/01/2014 10:15 AM [facility] [address] IM[facility] [phone]   Please pick up your oral contrast prep at your scheduled location at least 1 day prior to your appointment. Liquids only 4 hours prior to your exam. Any medications can be taken as usual. Please arrive 15 min prior to your scheduled exam time.   03/03/2014 10:00 AM Gas, PA-C [place] [phone]   03/04/2014 11:00 AM [facility] [phone]   Future Orders Complete By Expires   Call MD for:  temperature >100.4  As directed    Call MD for:  As directed    Diet - low sodium heart healthy  As directed    Discharge instructions  As directed    Increase activity slowly  As directed       Consultations: Treatment Team:  [facility], MD Barbie Haggis, Med Student  Procedures Performed:  Dg Chest 2 View  02/23/2014   CLINICAL DATA:  Shortness of breath.  EXAM: CHEST  2 VIEW  COMPARISON:  07/22/2012 and prior chest radiographs  FINDINGS: Cardiomegaly and CABG changes noted.  A venous catheter with tip overlying the cavoatrial junction again noted.  Patchy left lower lobe opacity may represent atelectasis or airspace disease/ pneumonia.  Chronic pulmonary opacities and peribronchial thickening again noted.  There is no evidence of pleural effusion or pneumothorax.  IMPRESSION: Patchy left lower lobe opacity which may be related to chronic changes/atelectasis but pneumonia is not excluded.  Cardiomegaly  and CABG changes.   Electronically Signed   By: Hassan Rowan M.D.   On: 02/23/2014 10:52    Cardiac Cath:   Findings:  RA = 9  RV = 67/5/10  PA = 71/21 (39)  PCW = 13  Fick cardiac output/index = 5.5/3.1  PVR = 4.6 Woods  FA sat = 94%  PA sat = 63%,64%  Ao Pressure: 177/76 (119)  LV Pressure: 169/9/20  There was no signficant gradient across the aortic valve on pullback.  Left main: Heavily calcified. Distal 40-50%  LAD: Diffuse severe disease. Totaled in midsection. Mid to distal vessel fills from LIMA. 2 diagonals with diffuse disease. There is a 70-80% lesion in the midsection of the 2nd diagonal  LCX: Small heavily diseased vessel. 30-40% ostial. Long 95% lesion in mid AV groove after take-off of OM-1. Distal vessel small and diffusely diseased. OM-1 mild plaque. OM-2 and OM-3 occluded  RCA: Ostial spasm. Long stent in midsection is patent. Distal RCA 40% before PDA. In Mid PDA50% lesion. Mild to moderate plaque in PLs. R to L collaterals filling portion of LCX and septals  LIMA-LAD: widely patent with 70-75% eccentric lesion in LAD after insertion of LIMA. The apical LAD is occluded.  SVG-OM: totally occluded proximally (chronic)  LV-gram done in the RAO projection: Ejection fraction = 60% No wall motion abnormalities  Assessment:  1. Severe 3v CAD  2. LIMA patent  3. Chronic occlusion of SVG-OM  4. Normal LV function  5. Moderate to severe PAH much improved on Flolan    Admission HPI: Meagan [name] is a 68 y.o. woman who presents with a pmhx of heart failure, pulm HTN on IV Flolan who presents with a cc of exertional chest and jaw pain. The patient was in her normal state of health until a few weeks ago when she started developing slowly progressive SOB and increased orthopnea. She reports associated symptom of jaw claudication. The jaw claudication manifest with the onset of eating, but resolves. Over the last few days she notes jaw pain and chest pain with exertion. The patient  has been compliant with her medications. She denies fevers chills. She has some night sweats with mild dampness of her head (no drenching sweats) that is associated with using numerous heating blankets. She denies active chest pain. She admits to her chronic baseline LE pain (fibromyagia).  Hospital Course by problem list: Active Problems:   DIABETES MELLITUS, TYPE II, CONTROLLED   PULMONARY HYPERTENSION   Acute on chronic diastolic heart failure   Nausea   Pneumonia   CAP (community acquired pneumonia)   Acute on Chronic Heart Failure  The patient has a history of dCHF, CAD s/p CABG and RCA stenting, and PAH. She is treated with flolan for PAH. She is taking Metoprolol and Imdur as well. Her heart failure is managed by Dr. Haroldine Laws as outpatient. He was consulted at this admission and will be managing her acute on chronic heart failure. Cath was performed by Dr. Haroldine Laws which did not reveal a likely cause of her Jaw pain with exertion. The jaw pain with exertion resolved early during the hospitalization. If it returns, or she has signs of exertional chest pain ro equivalents. Dr. Haroldine Laws rec's nuclear stress test.  Pneumonia  The patient possibly had a pneumonia given SOB cough and concerning CXR. Treated intitially with empiric ABX (azithro ceftriaxone) for CAP and deescalated to ceftin. Patient had QTc of 520 on arrival to ED. Repeat was 500. Plan to avoid fluorquinolones due to QT prolongation. D/c on ceftin.  [place]  Patient on flolan. Appears to be have dose limiting side effect of jaw claudication. Patient reports eating well despite this pain. Plan for f/u with cardiologist at [facility].  Nausea  The patients nausea is unknown etiology at this time. No vommitting, bloody bowel movement of melena. Treated symptomatically with  zofran. Patient refused phenergan.  Rash  Mild erythema around port. No signs of infection at this time. Plan for conservative management and to observe. Advise  patient to follow up as outpatient. Patient was educated on signs of infection. Patients states she will have home nurse observe rash.  DM  Stable during admission.  Anxiety  Stable during admission.  Fibromyalgia  Stable during admission.  GERD  Patient refused formulary PPI. Restarted home PPI on d/c.  History of Skin Breakdown on Buttocks  Well healed, no active breakdown.   Dyslipidemia  Appears stable. Continue home Vytorin.   Discharge Vitals:   BP 160/59  Pulse 86  Temp(Src) 98.6 F (37 C) (Oral)  Resp 24  Wt 150 lb 5.7 oz (68.2 kg)  SpO2 100%  Discharge Labs:  Results for orders placed during the hospital encounter of 02/23/14 (from the past 24 hour(s))  POCT I-STAT 3, ART BLOOD GAS (G3+)     Status: Abnormal   Collection Time    02/24/14  1:52 PM      Result Value Ref Range   pH, Arterial 7.409  7.350 - 7.450   pCO2 arterial 51.3 (*) 35.0 - 45.0 mmHg   pO2, Arterial 72.0 (*) 80.0 - 100.0 mmHg   Bicarbonate 32.5 (*) 20.0 - 24.0 mEq/L   TCO2 34  0 - 100 mmol/L   O2 Saturation 94.0     Acid-Base Excess 7.0 (*) 0.0 - 2.0 mmol/L   Sample type ARTERIAL    POCT I-STAT 3, VENOUS BLOOD GAS (G3P V)     Status: Abnormal   Collection Time    02/24/14  1:58 PM      Result Value Ref Range   pH, Ven 7.354 (*) 7.250 - 7.300   pCO2, Ven 58.1 (*) 45.0 - 50.0 mmHg   pO2, Ven 35.0  30.0 - 45.0 mmHg   Bicarbonate 32.4 (*) 20.0 - 24.0 mEq/L   TCO2 34  0 - 100 mmol/L   O2 Saturation 63.0     Acid-Base Excess 5.0 (*) 0.0 - 2.0 mmol/L   Sample type VENOUS    POCT I-STAT 3, VENOUS BLOOD GAS (G3P V)     Status: Abnormal   Collection Time    02/24/14  1:59 PM      Result Value Ref Range   pH, Ven 7.355 (*) 7.250 - 7.300   pCO2, Ven 55.7 (*) 45.0 - 50.0 mmHg   pO2, Ven 36.0  30.0 - 45.0 mmHg   Bicarbonate 31.1 (*) 20.0 - 24.0 mEq/L   TCO2 33  0 - 100 mmol/L   O2 Saturation 65.0     Acid-Base Excess 4.0 (*) 0.0 - 2.0 mmol/L   Sample type VENOUS     Comment NOTIFIED  PHYSICIAN    GLUCOSE, CAPILLARY     Status: Abnormal   Collection Time    02/24/14  3:12 PM      Result Value Ref Range   Glucose-Capillary 141 (*) 70 - 99 mg/dL  GLUCOSE, CAPILLARY     Status: Abnormal   Collection Time    02/24/14  4:27 PM      Result Value Ref Range   Glucose-Capillary 160 (*) 70 - 99 mg/dL  GLUCOSE, CAPILLARY     Status: Abnormal   Collection Time    02/24/14  9:31 PM      Result Value Ref Range   Glucose-Capillary 125 (*) 70 - 99 mg/dL  MAGNESIUM     Status: None  Collection Time    02/25/14  3:30 AM      Result Value Ref Range   Magnesium 2.0  1.5 - 2.5 mg/dL  BASIC METABOLIC PANEL     Status: Abnormal   Collection Time    02/25/14  3:30 AM      Result Value Ref Range   Sodium 138  137 - 147 mEq/L   Potassium 4.3  3.7 - 5.3 mEq/L   Chloride 98  96 - 112 mEq/L   CO2 29  19 - 32 mEq/L   Glucose, Bld 129 (*) 70 - 99 mg/dL   BUN 10  6 - 23 mg/dL   Creatinine, Ser 0.84  0.50 - 1.10 mg/dL   Calcium 9.2  8.4 - 10.5 mg/dL   GFR calc non Af Amer 69 (*) >90 mL/min   GFR calc Af Amer 80 (*) >90 mL/min  CBC     Status: Abnormal   Collection Time    02/25/14  3:30 AM      Result Value Ref Range   WBC 10.6 (*) 4.0 - 10.5 K/uL   RBC 3.45 (*) 3.87 - 5.11 MIL/uL   Hemoglobin 10.0 (*) 12.0 - 15.0 g/dL   HCT 30.6 (*) 36.0 - 46.0 %   MCV 88.7  78.0 - 100.0 fL   MCH 29.0  26.0 - 34.0 pg   MCHC 32.7  30.0 - 36.0 g/dL   RDW 12.8  11.5 - 15.5 %   Platelets 306  150 - 400 K/uL  GLUCOSE, CAPILLARY     Status: Abnormal   Collection Time    02/25/14  8:34 AM      Result Value Ref Range   Glucose-Capillary 125 (*) 70 - 99 mg/dL  GLUCOSE, CAPILLARY     Status: Abnormal   Collection Time    02/25/14 12:22 PM      Result Value Ref Range   Glucose-Capillary 156 (*) 70 - 99 mg/dL    Signed: Marrion Coy, MD 02/25/2014, 1:22 PM   Time Spent on Discharge: 35 minutes Services Ordered on Discharge: None Equipment Ordered on Discharge: None

## 2014-02-25 NOTE — Progress Notes (Signed)
Subjective:  NAEON. Breathing better. Jaw pain improved. Plan for d/c today.  Objective: Vital signs in last 24 hours: Filed Vitals:   02/25/14 0215 02/25/14 0228 02/25/14 0344 02/25/14 0655  BP:  162/50 126/55   Pulse: 89 86    Temp:   99 F (37.2 C)   TempSrc:      Resp: 17 20 18 25   Weight:    150 lb 5.7 oz (68.2 kg)  SpO2: 100% 100% 100% 99%   Weight change: -4 lb 10.3 oz (-2.108 kg)  Intake/Output Summary (Last 24 hours) at 02/25/14 0706 Last data filed at 02/25/14 0600  Gross per 24 hour  Intake 543.49 ml  Output   1200 ml  Net -656.51 ml   Physical Exam  Constitutional: She is oriented to person, place, and time. She appears well-developed and well-nourished. No distress.  HENT:  Head: Normocephalic and atraumatic.  Cardiovascular: Normal rate, regular rhythm and intact distal pulses.   Murmur heard. Pulmonary/Chest: Effort normal. No respiratory distress. She has no wheezes. She has rales.  Abdominal: Soft. Bowel sounds are normal. She exhibits no distension. There is tenderness. There is no rebound and no guarding.  Neurological: She is alert and oriented to person, place, and time.  Skin: She is not diaphoretic.  Erythema 10 cm around port in right upper chest. No pain. No warmness. No purulent drainage.  Psychiatric: She has a normal mood and affect. Her behavior is normal.    Lab Results: Basic Metabolic Panel:  Recent Labs Lab 02/24/14 0310 02/25/14 0330  NA 142 138  K 4.1 4.3  CL 98 98  CO2 33* 29  GLUCOSE 143* 129*  BUN 13 10  CREATININE 0.82 0.84  CALCIUM 8.8 9.2  MG 2.0 2.0   Liver Function Tests:  Recent Labs Lab 02/23/14 1006  AST 10  ALT 10  ALKPHOS 141*  BILITOT 0.2*  PROT 6.6  ALBUMIN 3.2*   CBC:  Recent Labs Lab 02/24/14 0310 02/25/14 0330  WBC 12.0* 10.6*  HGB 10.1* 10.0*  HCT 31.4* 30.6*  MCV 89.7 88.7  PLT 284 306   BNP:  Recent Labs Lab 02/23/14 1006  PROBNP 803.2*   CBG:  Recent Labs Lab  02/24/14 0318 02/24/14 0822 02/24/14 1143 02/24/14 1512 02/24/14 1627 02/24/14 2131  GLUCAP 134* 127* 103* 141* 160* 125*   Coagulation:  Recent Labs Lab 02/23/14 1830  LABPROT 12.1  INR 0.91   Micro Results: Recent Results (from the past 240 hour(s))  MRSA PCR SCREENING     Status: None   Collection Time    02/23/14  4:08 PM      Result Value Ref Range Status   MRSA by PCR NEGATIVE  NEGATIVE Final   Comment:            The GeneXpert MRSA Assay (FDA     approved for NASAL specimens     only), is one component of a     comprehensive MRSA colonization     surveillance program. It is not     intended to diagnose MRSA     infection nor to guide or     monitor treatment for     MRSA infections.   Studies/Results: Dg Chest 2 View  02/23/2014   CLINICAL DATA:  Shortness of breath.  EXAM: CHEST  2 VIEW  COMPARISON:  07/22/2012 and prior chest radiographs  FINDINGS: Cardiomegaly and CABG changes noted.  A venous catheter with tip overlying the cavoatrial junction  again noted.  Patchy left lower lobe opacity may represent atelectasis or airspace disease/ pneumonia.  Chronic pulmonary opacities and peribronchial thickening again noted.  There is no evidence of pleural effusion or pneumothorax.  IMPRESSION: Patchy left lower lobe opacity which may be related to chronic changes/atelectasis but pneumonia is not excluded.  Cardiomegaly and CABG changes.   Electronically Signed   By: Hassan Rowan M.D.   On: 02/23/2014 10:52   Medications: I have reviewed the patient's current medications. Scheduled Meds: . cefUROXime  500 mg Oral BID WC  . clopidogrel  75 mg Oral Q breakfast  . DULoxetine  60 mg Oral Daily  . enoxaparin (LOVENOX) injection  40 mg Subcutaneous Q24H  . ezetimibe-simvastatin  1 tablet Oral QHS  . insulin aspart  0-15 Units Subcutaneous TID WC  . isosorbide mononitrate  90 mg Oral q morning - 10a  . levothyroxine  25 mcg Oral QAC breakfast  . metoprolol succinate  100 mg Oral  q morning - 10a  . pantoprazole  40 mg Oral Daily  . sodium chloride  3 mL Intravenous Q12H   Continuous Infusions: . epoprostenol     PRN Meds:.acetaminophen, acetaminophen, calcium carbonate, HYDROmorphone, nitroGLYCERIN, ondansetron Assessment/Plan: Active Problems:   DIABETES MELLITUS, TYPE II, CONTROLLED   PULMONARY HYPERTENSION   Acute on chronic diastolic heart failure   Nausea   Pneumonia   CAP (community acquired pneumonia)  Acute on Chronic Heart Failure  The patient has a history of dCHF, CAD s/p CABG and RCA stenting, and PAH. She is treated with flolan for PAH. She is taking Metoprolol and Imdur as well. Her heart failure is managed by Dr. Haroldine Laws as outpatient. He was consulted at this admission and will be managing her acute on chronic heart failure.  - Plan for IV diuresis with lasix by heart failure team. (80 mg in ED), 40 meq K in ED.  - Mag BMP BID  - monitor I/O strict, daily weight  - Cath per Dr. Haroldine Laws on 5/6   Pneumonia  The patient possibly has a pneumonia given SOB cough and concerning CXR. Treated intitially with empiric ABX (azithro ceftriaxone) for CAP and deescalated to ceftin. Patient had QTc of 520 on arrival to ED. Repeat was 500. Plan to avoid fluorquinolones due to QT prolongation (patient expected to be given zofran post cath).  [place]  Patient on flolan. Appears to be have dose limiting side effect of jaw claudication. Patient reports eating well despite this pain. Pharmacy was made aware of the flolan. Patients husband will provide [place] with flolan from home.  - Flolan will be managed by Dr. Haroldine Laws.   Nausea  The patients nausea is unknown etiology at this time. No vommitting, bloody bowel movement of melena. Plan to treat symptomatically.  - d/c zofran and give phenergan give QTc prolongation. Patient refused phenergan stating it makes her ":crazy". She states that zofran is the only thing that works for her. I elected to restart ODT  zofran.  Rash Mild erythema around port. No signs of infection at this time. Plan for conservative management and to observe. Advise patient to follow up as outpatient.  DM  Hold home lantus  SSI   Anxiety  Xanax 0.5 mg prn BID   Fibromyalgia  Appears stable. Continue home meds (Cymbalta and Dilaudid 4 mg PO q 4 hr prn)   GERD  Continue home PPI   History of Skin Breakdown on Buttocks  Well healed, no active breakdown.  Dyslipidemia  Appears stable. Continue home Vytorin.   DVT: heparin   Diet: Heart Carb Dieat  Dispo: Disposition is deferred at this time, awaiting improvement of current medical problems.  Anticipated discharge in approximately 1-2 day(s).   The patient does have a current PCP Redmond School, MD) and does not need an [facility] hospital follow-up appointment after discharge.  The patient does have transportation limitations that hinder transportation to clinic appointments.  .Services Needed at time of discharge: Y = Yes, Blank = No PT:   OT:   RN:   Equipment:   Other:     LOS: 2 days   Marrion Coy, MD 02/25/2014, 7:06 AM

## 2014-02-25 NOTE — Progress Notes (Signed)
Monitored pt Right [place] site.  At begging of shift looked red across chest area. No drainage noted at iv site.   Per pt's husband she had this before and was given a cream to apply to site. Husband or pt does not remember what cream was ordered.  Paged IV team to access site and per IV team looks like a reaction of some sort.  Notified MD to accesss site.   Will continue to monitor.  Wynona Canes

## 2014-02-27 NOTE — Discharge Summary (Signed)
I have reviewed Dr. Laroy Apple note and agree with documentation as outlined including assessment and treatment plan.

## 2014-02-28 NOTE — Progress Notes (Signed)
No show KEFALAS,THOMAS 04/12/2014

## 2014-03-01 ENCOUNTER — Ambulatory Visit (HOSPITAL_COMMUNITY): Admission: RE | Admit: 2014-03-01 | Payer: Medicare Other | Source: Ambulatory Visit

## 2014-03-01 ENCOUNTER — Other Ambulatory Visit (HOSPITAL_COMMUNITY): Payer: Medicare Other

## 2014-03-03 ENCOUNTER — Ambulatory Visit (HOSPITAL_COMMUNITY): Payer: Medicare Other | Admitting: Oncology

## 2014-03-04 ENCOUNTER — Ambulatory Visit (HOSPITAL_COMMUNITY)
Admission: RE | Admit: 2014-03-04 | Discharge: 2014-03-04 | Disposition: A | Discharge status: Home / Self Care | Payer: Medicare Other | Source: Ambulatory Visit | Attending: Internal Medicine | Admitting: Internal Medicine

## 2014-03-04 ENCOUNTER — Encounter (HOSPITAL_COMMUNITY): Payer: Self-pay

## 2014-03-04 VITALS — BP 146/62 | HR 94 | Wt 154.0 lb

## 2014-03-04 DIAGNOSIS — E039 Hypothyroidism, unspecified: Secondary | ICD-10-CM | POA: Insufficient documentation

## 2014-03-04 DIAGNOSIS — Z794 Long term (current) use of insulin: Secondary | ICD-10-CM | POA: Insufficient documentation

## 2014-03-04 DIAGNOSIS — Z8614 Personal history of Methicillin resistant Staphylococcus aureus infection: Secondary | ICD-10-CM | POA: Insufficient documentation

## 2014-03-04 DIAGNOSIS — Z951 Presence of aortocoronary bypass graft: Secondary | ICD-10-CM | POA: Insufficient documentation

## 2014-03-04 DIAGNOSIS — I2789 Other specified pulmonary heart diseases: Secondary | ICD-10-CM

## 2014-03-04 DIAGNOSIS — Z7902 Long term (current) use of antithrombotics/antiplatelets: Secondary | ICD-10-CM | POA: Insufficient documentation

## 2014-03-04 DIAGNOSIS — I2510 Atherosclerotic heart disease of native coronary artery without angina pectoris: Secondary | ICD-10-CM | POA: Insufficient documentation

## 2014-03-04 DIAGNOSIS — I509 Heart failure, unspecified: Secondary | ICD-10-CM | POA: Insufficient documentation

## 2014-03-04 DIAGNOSIS — I10 Essential (primary) hypertension: Secondary | ICD-10-CM | POA: Insufficient documentation

## 2014-03-04 DIAGNOSIS — I5032 Chronic diastolic (congestive) heart failure: Secondary | ICD-10-CM | POA: Insufficient documentation

## 2014-03-04 DIAGNOSIS — Z79899 Other long term (current) drug therapy: Secondary | ICD-10-CM | POA: Insufficient documentation

## 2014-03-04 DIAGNOSIS — E119 Type 2 diabetes mellitus without complications: Secondary | ICD-10-CM | POA: Insufficient documentation

## 2014-03-04 NOTE — Patient Instructions (Signed)
We will contact you in 3 months to schedule your next appointment.  

## 2014-03-04 NOTE — Progress Notes (Signed)
Patient ID: Meagan [name], female   DOB: [birth year], 68 y.o.   MRN: [id number] GI: Dr Gala Romney Oncologist: Dr Tressie Stalker Cardiologsit: Dr Gilles Chiquito- [facility] IV Flolan PCP: Dr Dollene Primrose  HPI: Ms. Bound is a 68 y/o woman with multiple medical problems including CAD s/p CABG and RCA stent, DM2, obesity, fibromyalgia, diastolic HF. She has severe PAH and is on Flolan followed by Dr. Gilles Chiquito at [facility].   Admitted to [facility] 02/22/14 with jaw pain and dyspnea. Had RHC/LHC which showed decreased PVR and stable coronaries. Diuresed with IV lasix. Also treated for possible pneumonia. Discharge weight was 150 pounds.   She returns for follow up. Her husband cut down Flolan 69 due to jaw pain. She says jaw pain is better at lower seetting. Mild jaw pain when she is eating.  Breathing back to baseline. Weight at home 151 pounds. No episodes of presyncope or syncope.  Compliant with medications. Ambulates in clinic with rolling walker.    CT chest 10/10: No PE or ILD PFTs 10/10: Restrictive lung disease FEV1 1.01L (56%) FVC 1.2 (48%) DLCO 24% Rheum work-up which was negative. Sleep study 11/10: negative for OSA  RHC/LHC 01/21/13  RA = 11  RV = 82/0/15  PA = 78/18 (39)  PCW = 19  Fick cardiac output/index = 6.1/3.6  PVR = 3.3 [place]  FA sat = 93%  PA sat = 62%,64%  Ao Pressure: 106/39 (65)  LV Pressure: 124/5/16 Left main: Heavily calcified. Distal 40-50%  LAD: Diffuse severe disease. Totalled in midsection. Mid to distal vessel fills from LIMA. 2 diagonals with diffuse disease. There is a 70-80% lesion in the midsection of the 2nd diagonal  LCX: Small heavily diseased vessel. 30-40% ostial. Long 95% lesion in mid AV groove after take-off of OM-1. Distal vessel small and diffusely diseased. OM-1 mild plaque. OM-2 and OM-3 occluded  RCA: Ostial spasm. Long stent in midsection is patent. Distal RCA 40% before PDA. Mild to moderate plaque in PD and PLs.  LV-gram done in the RAO projection: Ejection fraction = 60-65% No  wall motion abnormalities  LIMA-LAD: widely patent with 50-60% lesion in LAD after insertion of LIMA  SVG-OM: totally occluded proximally (chronic)   RHC/LHC 02/23/14  RA = 9  RV = 67/5/10  PA = 71/21 (39)  PCW = 13  Fick cardiac output/index = 5.5/3.1  PVR = 4.6 Woods  FA sat = 94%  PA sat = 63%,64%  Ao Pressure: 177/76 (119)  LV Pressure: 169/9/20  Left main: Heavily calcified. Distal 40-50%  LAD: Diffuse severe disease. Totaled in midsection. Mid to distal vessel fills from LIMA. 2 diagonals with diffuse disease. There is a 70-80% lesion in the midsection of the 2nd diagonal  LCX: Small heavily diseased vessel. 30-40% ostial. Long 95% lesion in mid AV groove after take-off of OM-1. Distal vessel small and diffusely diseased. OM-1 mild plaque. OM-2 and OM-3 occluded  RCA: Ostial spasm. Long stent in midsection is patent. Distal RCA 40% before PDA. In Mid PDA50% lesion. Mild to moderate plaque in PLs. R to L collaterals filling portion of LCX and septals  LIMA-LAD: widely patent with 70-75% eccentric lesion in LAD after insertion of LIMA. The apical LAD is occluded.  SVG-OM: totally occluded proximally (chronic)   Echo 06/30/13: EF 60% RV moderately dilated. Evidence cor pulmonale RVSP 54   ROS: All systems negative except as listed in HPI, PMH and Problem List.  Past Medical History  Diagnosis Date  . Fibromyalgia   . Hypercholesterolemia   .  Hypertension     pulmonary  pap 110/31 (mean 62)by RHC 9/10 negative PE by chest CT 2010  . Pulmonary edema     Failed Revatio   . ARF (acute renal failure)     poor candidate for ACE -I or ARB  . Restrictive lung disease     obesity hypoventilation syndrome  . Renal insufficiency   . CAD (coronary artery disease)     multivessel DES x 2 RCA 2007  . Chronic diastolic heart failure   . MRSA (methicillin resistant staphylococcus aureus) pneumonia   . Pulmonary hypertension   . Chronic anemia     anemia of chronic disease based on anemia  panel 2011  . Pancreatic mass 08/01/2012  . PONV (postoperative nausea and vomiting)   . Heart murmur     "when I was small"  . CHF (congestive heart failure)   . Anginal pain   . Pneumonia X 3  . Type II diabetes mellitus   . Hypothyroidism   . H/O hiatal hernia   . GERD (gastroesophageal reflux disease)   . Anxiety   . On home oxygen therapy     "6L all the time" (02/24/2014    Current Outpatient Prescriptions  Medication Sig Dispense Refill  . ALPRAZolam (XANAX) 0.5 MG tablet Take 0.5 mg by mouth 2 (two) times daily as needed. For sleep. *Prescribed one tablet three times daily as needed*      . clopidogrel (PLAVIX) 75 MG tablet Take 75 mg by mouth every morning.       . cyclobenzaprine (FLEXERIL) 10 MG tablet Take 10 mg by mouth 2 (two) times daily as needed for muscle spasms.      . DULoxetine (CYMBALTA) 60 MG capsule Take 60 mg by mouth every morning.       Marland Kitchen Epoprostenol Sodium (FLOLAN IV) Inject into the vein. 1.5 mg. Inject into the vein continuously. Infuse mg/kg/min via cont IV pump.      . furosemide (LASIX) 40 MG tablet Take 80 mg by mouth daily.       Marland Kitchen HYDROmorphone (DILAUDID) 2 MG tablet Take 4 mg by mouth every 4 (four) hours as needed for severe pain.      Marland Kitchen insulin glargine (LANTUS) 100 UNIT/ML injection Inject 26 Units into the skin every morning.       . isosorbide mononitrate (IMDUR) 60 MG 24 hr tablet Take 90 mg by mouth every morning.       Marland Kitchen levothyroxine (SYNTHROID, LEVOTHROID) 25 MCG tablet Take 25 mcg by mouth every morning.       . metoprolol (TOPROL-XL) 100 MG 24 hr tablet Take 100 mg by mouth every morning.       . naproxen sodium (ALEVE) 220 MG tablet Take 440 mg by mouth daily as needed. *Takes only as needed when other pain medications are not used*      . nitroGLYCERIN (NITROSTAT) 0.4 MG SL tablet Place 0.4 mg under the tongue every 5 (five) minutes as needed for chest pain.       Marland Kitchen omeprazole (PRILOSEC) 20 MG capsule Take 20 mg by mouth 2 (two) times  daily.        . ondansetron (ZOFRAN) 8 MG tablet Take 8 mg by mouth every 8 (eight) hours as needed. For nausea       . ondansetron (ZOFRAN-ODT) 4 MG disintegrating tablet Take 1 tablet (4 mg total) by mouth every 8 (eight) hours as needed for nausea or vomiting.  20 tablet  0  . Oxygen [employer] SOLN Inhale into the lungs continuous. 6 liters      . potassium chloride SA (K-DUR,KLOR-CON) 20 MEQ tablet Take 40 mEq by mouth daily.       . traMADol (ULTRAM) 50 MG tablet Take 50 mg by mouth every 3 (three) hours as needed. For pain. *takes with Dilaudid (Hydromorphone)      . VYTORIN 10-40 MG per tablet TAKE ONE TABLET AT BEDTIME  90 tablet  3  . [DISCONTINUED] cetirizine (ZYRTEC) 10 MG tablet Take 10 mg by mouth daily.         No current facility-administered medications for this encounter.    Filed Vitals:   03/04/14 1128  BP: 146/62  Pulse: 94  Weight: 154 lb (69.854 kg)  SpO2: 95%   PHYSICAL EXAM: General: No distress. Wearing 6L [place] O2 husband and sister present  HEENT: normal  Neck: thick . JVP 6-7 Carotids 2+ bilat; no bruits. No lymphadenopathy or thryomegaly appreciated.  Cor: PMI nonpalpable Regular rate & rhythm. 2/6 TR. P2 perhaps mildly accentuated but not severely so  Hickman catheter ok Lungs: clear but decreased throughout no wheezing on 6 liters [place]  Abdomen: obese. soft, nontender, nondistended. No hepatosplenomegaly. No bruits or masses. Good bowel sounds.  Extremities: no cyanosis, clubbing, macular-papular erythematous rash on LEs, no edema  Neuro: alert & oriented x 3, cranial nerves grossly intact. moves all 4 extremities w/o difficulty. Affect pleasant.    ASSESSMENT:   1. [place] on Flolan through [facility] 2. CAD s/p CABG and stents 3. Chronic diastolic HF 4. DM2  Amy D Clegg,NP-C  12:04 PM  PLAN/DISCUSSION:  Patient seen and examined with Darrick Grinder, NP. We discussed all aspects of the encounter. I agree with the assessment and plan as stated  above.   She is much improved today.  Jaw pain better with reduction in Flolan. Cath results reviewed with her and her husband. Pulmonary pressures continue to improve. CAD stable. LAD lesion does not appear flow limiting. Volume status looks good. Has f/u with Dr. Gilles Chiquito at [facility] soon.  Shaune Pascal Bensimhon,MD 2:03 PM

## 2014-05-18 ENCOUNTER — Encounter (HOSPITAL_COMMUNITY): Payer: Self-pay | Admitting: Vascular Surgery

## 2014-06-03 ENCOUNTER — Emergency Department (HOSPITAL_COMMUNITY): Payer: Medicare Other

## 2014-06-03 ENCOUNTER — Inpatient Hospital Stay (HOSPITAL_COMMUNITY)
Admission: EM | Admit: 2014-06-03 | Discharge: 2014-06-09 | DRG: 377 | Disposition: A | Discharge status: Home Health | Payer: Medicare Other | Attending: Internal Medicine | Admitting: Internal Medicine

## 2014-06-03 ENCOUNTER — Encounter (HOSPITAL_COMMUNITY): Payer: Self-pay | Admitting: Emergency Medicine

## 2014-06-03 DIAGNOSIS — Z882 Allergy status to sulfonamides status: Secondary | ICD-10-CM | POA: Diagnosis not present

## 2014-06-03 DIAGNOSIS — F411 Generalized anxiety disorder: Secondary | ICD-10-CM | POA: Diagnosis present

## 2014-06-03 DIAGNOSIS — N3020 Other chronic cystitis without hematuria: Secondary | ICD-10-CM | POA: Diagnosis present

## 2014-06-03 DIAGNOSIS — Z9861 Coronary angioplasty status: Secondary | ICD-10-CM

## 2014-06-03 DIAGNOSIS — Z951 Presence of aortocoronary bypass graft: Secondary | ICD-10-CM | POA: Diagnosis not present

## 2014-06-03 DIAGNOSIS — I129 Hypertensive chronic kidney disease with stage 1 through stage 4 chronic kidney disease, or unspecified chronic kidney disease: Secondary | ICD-10-CM | POA: Diagnosis present

## 2014-06-03 DIAGNOSIS — N189 Chronic kidney disease, unspecified: Secondary | ICD-10-CM | POA: Diagnosis present

## 2014-06-03 DIAGNOSIS — D136 Benign neoplasm of pancreas: Secondary | ICD-10-CM | POA: Diagnosis present

## 2014-06-03 DIAGNOSIS — J9610 Chronic respiratory failure, unspecified whether with hypoxia or hypercapnia: Secondary | ICD-10-CM | POA: Diagnosis present

## 2014-06-03 DIAGNOSIS — I509 Heart failure, unspecified: Secondary | ICD-10-CM | POA: Diagnosis present

## 2014-06-03 DIAGNOSIS — R778 Other specified abnormalities of plasma proteins: Secondary | ICD-10-CM | POA: Diagnosis present

## 2014-06-03 DIAGNOSIS — K2100 Gastro-esophageal reflux disease with esophagitis, without bleeding: Secondary | ICD-10-CM | POA: Diagnosis present

## 2014-06-03 DIAGNOSIS — I2510 Atherosclerotic heart disease of native coronary artery without angina pectoris: Secondary | ICD-10-CM | POA: Diagnosis present

## 2014-06-03 DIAGNOSIS — I5032 Chronic diastolic (congestive) heart failure: Secondary | ICD-10-CM

## 2014-06-03 DIAGNOSIS — IMO0001 Reserved for inherently not codable concepts without codable children: Secondary | ICD-10-CM | POA: Diagnosis present

## 2014-06-03 DIAGNOSIS — I2789 Other specified pulmonary heart diseases: Secondary | ICD-10-CM

## 2014-06-03 DIAGNOSIS — R110 Nausea: Secondary | ICD-10-CM

## 2014-06-03 DIAGNOSIS — F1120 Opioid dependence, uncomplicated: Secondary | ICD-10-CM | POA: Diagnosis present

## 2014-06-03 DIAGNOSIS — K449 Diaphragmatic hernia without obstruction or gangrene: Secondary | ICD-10-CM | POA: Diagnosis present

## 2014-06-03 DIAGNOSIS — Z885 Allergy status to narcotic agent status: Secondary | ICD-10-CM | POA: Diagnosis not present

## 2014-06-03 DIAGNOSIS — K920 Hematemesis: Secondary | ICD-10-CM | POA: Diagnosis present

## 2014-06-03 DIAGNOSIS — Z66 Do not resuscitate: Secondary | ICD-10-CM | POA: Diagnosis present

## 2014-06-03 DIAGNOSIS — I214 Non-ST elevation (NSTEMI) myocardial infarction: Secondary | ICD-10-CM

## 2014-06-03 DIAGNOSIS — E876 Hypokalemia: Secondary | ICD-10-CM | POA: Diagnosis present

## 2014-06-03 DIAGNOSIS — Z6830 Body mass index (BMI) 30.0-30.9, adult: Secondary | ICD-10-CM | POA: Diagnosis not present

## 2014-06-03 DIAGNOSIS — R112 Nausea with vomiting, unspecified: Secondary | ICD-10-CM | POA: Diagnosis present

## 2014-06-03 DIAGNOSIS — E662 Morbid (severe) obesity with alveolar hypoventilation: Secondary | ICD-10-CM | POA: Diagnosis present

## 2014-06-03 DIAGNOSIS — A0472 Enterocolitis due to Clostridium difficile, not specified as recurrent: Secondary | ICD-10-CM

## 2014-06-03 DIAGNOSIS — Z888 Allergy status to other drugs, medicaments and biological substances status: Secondary | ICD-10-CM

## 2014-06-03 DIAGNOSIS — G8929 Other chronic pain: Secondary | ICD-10-CM | POA: Diagnosis present

## 2014-06-03 DIAGNOSIS — Z886 Allergy status to analgesic agent status: Secondary | ICD-10-CM | POA: Diagnosis not present

## 2014-06-03 DIAGNOSIS — Z8249 Family history of ischemic heart disease and other diseases of the circulatory system: Secondary | ICD-10-CM

## 2014-06-03 DIAGNOSIS — E119 Type 2 diabetes mellitus without complications: Secondary | ICD-10-CM

## 2014-06-03 DIAGNOSIS — E7800 Pure hypercholesterolemia, unspecified: Secondary | ICD-10-CM | POA: Diagnosis present

## 2014-06-03 DIAGNOSIS — K8020 Calculus of gallbladder without cholecystitis without obstruction: Secondary | ICD-10-CM | POA: Diagnosis present

## 2014-06-03 DIAGNOSIS — Z9981 Dependence on supplemental oxygen: Secondary | ICD-10-CM | POA: Diagnosis not present

## 2014-06-03 DIAGNOSIS — N179 Acute kidney failure, unspecified: Secondary | ICD-10-CM | POA: Diagnosis not present

## 2014-06-03 DIAGNOSIS — K2960 Other gastritis without bleeding: Secondary | ICD-10-CM | POA: Diagnosis present

## 2014-06-03 DIAGNOSIS — D72829 Elevated white blood cell count, unspecified: Secondary | ICD-10-CM | POA: Diagnosis not present

## 2014-06-03 DIAGNOSIS — Z7902 Long term (current) use of antithrombotics/antiplatelets: Secondary | ICD-10-CM | POA: Diagnosis not present

## 2014-06-03 DIAGNOSIS — Z794 Long term (current) use of insulin: Secondary | ICD-10-CM | POA: Diagnosis not present

## 2014-06-03 DIAGNOSIS — E039 Hypothyroidism, unspecified: Secondary | ICD-10-CM | POA: Diagnosis present

## 2014-06-03 DIAGNOSIS — R1319 Other dysphagia: Secondary | ICD-10-CM

## 2014-06-03 DIAGNOSIS — R7989 Other specified abnormal findings of blood chemistry: Secondary | ICD-10-CM

## 2014-06-03 DIAGNOSIS — R079 Chest pain, unspecified: Secondary | ICD-10-CM

## 2014-06-03 DIAGNOSIS — R509 Fever, unspecified: Secondary | ICD-10-CM | POA: Diagnosis present

## 2014-06-03 DIAGNOSIS — K922 Gastrointestinal hemorrhage, unspecified: Secondary | ICD-10-CM | POA: Diagnosis present

## 2014-06-03 LAB — CBC WITH DIFFERENTIAL/PLATELET
Basophils Absolute: 0 10*3/uL (ref 0.0–0.1)
Basophils Relative: 0 % (ref 0–1)
EOS ABS: 0 10*3/uL (ref 0.0–0.7)
Eosinophils Relative: 0 % (ref 0–5)
HCT: 35.7 % — ABNORMAL LOW (ref 36.0–46.0)
Hemoglobin: 12 g/dL (ref 12.0–15.0)
LYMPHS PCT: 3 % — AB (ref 12–46)
Lymphs Abs: 0.7 10*3/uL (ref 0.7–4.0)
MCH: 28.8 pg (ref 26.0–34.0)
MCHC: 33.6 g/dL (ref 30.0–36.0)
MCV: 85.6 fL (ref 78.0–100.0)
Monocytes Absolute: 0.9 10*3/uL (ref 0.1–1.0)
Monocytes Relative: 4 % (ref 3–12)
Neutro Abs: 19.7 10*3/uL — ABNORMAL HIGH (ref 1.7–7.7)
Neutrophils Relative %: 93 % — ABNORMAL HIGH (ref 43–77)
Platelets: 444 10*3/uL — ABNORMAL HIGH (ref 150–400)
RBC: 4.17 MIL/uL (ref 3.87–5.11)
RDW: 13.3 % (ref 11.5–15.5)
WBC: 21.3 10*3/uL — AB (ref 4.0–10.5)

## 2014-06-03 LAB — PROTIME-INR
INR: 1.03 (ref 0.00–1.49)
PROTHROMBIN TIME: 13.5 s (ref 11.6–15.2)

## 2014-06-03 LAB — COMPREHENSIVE METABOLIC PANEL
ALBUMIN: 4 g/dL (ref 3.5–5.2)
ALT: 11 U/L (ref 0–35)
AST: 22 U/L (ref 0–37)
Alkaline Phosphatase: 123 U/L — ABNORMAL HIGH (ref 39–117)
Anion gap: 18 — ABNORMAL HIGH (ref 5–15)
BUN: 12 mg/dL (ref 6–23)
CALCIUM: 10.1 mg/dL (ref 8.4–10.5)
CO2: 32 meq/L (ref 19–32)
Chloride: 91 mEq/L — ABNORMAL LOW (ref 96–112)
Creatinine, Ser: 1 mg/dL (ref 0.50–1.10)
GFR calc Af Amer: 65 mL/min — ABNORMAL LOW (ref >90)
GFR calc non Af Amer: 56 mL/min — ABNORMAL LOW (ref >90)
Glucose, Bld: 216 mg/dL — ABNORMAL HIGH (ref 70–99)
Potassium: 2.5 mEq/L — CL (ref 3.7–5.3)
SODIUM: 141 meq/L (ref 137–147)
TOTAL PROTEIN: 8 g/dL (ref 6.0–8.3)
Total Bilirubin: 0.6 mg/dL (ref 0.3–1.2)

## 2014-06-03 LAB — TYPE AND SCREEN
ABO/RH(D): O POS
Antibody Screen: NEGATIVE

## 2014-06-03 LAB — TROPONIN I
Troponin I: 0.33 ng/mL (ref <0.30)
Troponin I: 0.42 ng/mL (ref <0.30)

## 2014-06-03 LAB — URINALYSIS, ROUTINE W REFLEX MICROSCOPIC
Bilirubin Urine: NEGATIVE
Glucose, UA: 100 mg/dL — AB
LEUKOCYTES UA: NEGATIVE
NITRITE: NEGATIVE
PH: 7.5 (ref 5.0–8.0)
Protein, ur: 30 mg/dL — AB
SPECIFIC GRAVITY, URINE: 1.015 (ref 1.005–1.030)
UROBILINOGEN UA: 0.2 mg/dL (ref 0.0–1.0)

## 2014-06-03 LAB — URINE MICROSCOPIC-ADD ON

## 2014-06-03 LAB — I-STAT CG4 LACTIC ACID, ED: LACTIC ACID, VENOUS: 2.39 mmol/L — AB (ref 0.5–2.2)

## 2014-06-03 LAB — LIPASE, BLOOD: Lipase: 9 U/L — ABNORMAL LOW (ref 11–59)

## 2014-06-03 LAB — OCCULT BLOOD GASTRIC / DUODENUM (SPECIMEN CUP)
Occult Blood, Gastric: POSITIVE — AB
pH, Gastric: 2

## 2014-06-03 MED ORDER — IOHEXOL 300 MG/ML  SOLN
100.0000 mL | Freq: Once | INTRAMUSCULAR | Status: AC | PRN
  Administered 2014-06-03: 100 mL via INTRAVENOUS

## 2014-06-03 MED ORDER — HYDROMORPHONE HCL PF 1 MG/ML IJ SOLN
1.0000 mg | Freq: Once | INTRAMUSCULAR | Status: AC
  Administered 2014-06-03: 1 mg via INTRAVENOUS

## 2014-06-03 MED ORDER — NITROGLYCERIN 0.4 MG SL SUBL
0.4000 mg | SUBLINGUAL_TABLET | Freq: Once | SUBLINGUAL | Status: AC
  Administered 2014-06-03: 0.4 mg via SUBLINGUAL
  Filled 2014-06-03: qty 1

## 2014-06-03 MED ORDER — ACETAMINOPHEN 650 MG RE SUPP
650.0000 mg | Freq: Once | RECTAL | Status: AC
  Administered 2014-06-03: 650 mg via RECTAL
  Filled 2014-06-03: qty 1

## 2014-06-03 MED ORDER — SODIUM CHLORIDE 0.9 % IV BOLUS (SEPSIS)
500.0000 mL | Freq: Once | INTRAVENOUS | Status: AC
  Administered 2014-06-03: 500 mL via INTRAVENOUS

## 2014-06-03 MED ORDER — HYDROMORPHONE HCL PF 1 MG/ML IJ SOLN
INTRAMUSCULAR | Status: AC
  Filled 2014-06-03: qty 1

## 2014-06-03 MED ORDER — ONDANSETRON HCL 4 MG/2ML IJ SOLN
4.0000 mg | Freq: Once | INTRAMUSCULAR | Status: AC
  Administered 2014-06-03: 4 mg via INTRAVENOUS
  Filled 2014-06-03: qty 2

## 2014-06-03 MED ORDER — ONDANSETRON HCL 4 MG/2ML IJ SOLN
4.0000 mg | Freq: Once | INTRAMUSCULAR | Status: DC
  Filled 2014-06-03: qty 2

## 2014-06-03 MED ORDER — ONDANSETRON HCL 4 MG/2ML IJ SOLN
4.0000 mg | Freq: Once | INTRAMUSCULAR | Status: AC
  Administered 2014-06-03: 4 mg via INTRAVENOUS

## 2014-06-03 MED ORDER — LORAZEPAM 2 MG/ML IJ SOLN
0.5000 mg | Freq: Once | INTRAMUSCULAR | Status: AC
  Administered 2014-06-03: 0.5 mg via INTRAVENOUS
  Filled 2014-06-03: qty 1

## 2014-06-03 NOTE — ED Notes (Signed)
Dr Betsey Holiday informed of temp

## 2014-06-03 NOTE — ED Provider Notes (Signed)
CSN: [id number]     Arrival date & time 06/03/14  1731 History   First MD Initiated Contact with Patient 06/03/14 1751     Chief Complaint  Patient presents with  . Emesis  . Diarrhea     (Consider location/radiation/quality/duration/timing/severity/associated sxs/prior Treatment) HPI Comments: Patient presents to ER for evaluation of nausea, vomiting began last night. Symptoms began around 8:30 PM yesterday. She has had continued nausea and vomiting since its onset. She has not had any specific abdominal pain. Patient reports that the vomiting has been brown. She has been passing dark stools as well. Initially she had some loose stools, no diarrhea since. Patient has not had any chest pain associated with her symptoms. She does, however, have a history of fibromyalgia. She is complaining of increased pain in her legs currently, has not been able to hold down her pain medication today.  Patient is a 68 y.o. female presenting with vomiting and diarrhea.  Emesis Associated symptoms: diarrhea and myalgias   Diarrhea Associated symptoms: myalgias and vomiting     Past Medical History  Diagnosis Date  . Fibromyalgia   . Hypercholesterolemia   . Hypertension     pulmonary  pap 110/31 (mean 62)by RHC 9/10 negative PE by chest CT 2010  . Pulmonary edema     Failed Revatio   . ARF (acute renal failure)     poor candidate for ACE -I or ARB  . Restrictive lung disease     obesity hypoventilation syndrome  . Renal insufficiency   . CAD (coronary artery disease)     multivessel DES x 2 RCA 2007  . Chronic diastolic heart failure   . MRSA (methicillin resistant staphylococcus aureus) pneumonia   . Pulmonary hypertension   . Chronic anemia     anemia of chronic disease based on anemia panel 2011  . Pancreatic mass 08/01/2012  . PONV (postoperative nausea and vomiting)   . Heart murmur     "when I was small"  . CHF (congestive heart failure)   . Anginal pain   . Pneumonia X 3  . Type  II diabetes mellitus   . Hypothyroidism   . H/O hiatal hernia   . GERD (gastroesophageal reflux disease)   . Anxiety   . On home oxygen therapy     "6L all the time" (02/24/2014   Past Surgical History  Procedure Laterality Date  . Coronary artery bypass graft  1997    LIMA to LAD,SVG to OM  . Esophagogastroduodenoscopy  06/28/10    schatzki ring otherwise normal;small hiatal hernia  . Coronary angioplasty  1993  . Coronary angioplasty with stent placement  ~ 1996; 2008    "think I have 3 stents"  . Abdominal hysterectomy  ~ 1986  . Dilation and curettage of uterus  ~ 1984  . Tubal ligation     Family History  Problem Relation Age of Onset  . Cancer Mother     oral  . Heart attack Father   . COPD Brother   . Heart disease Brother    History  Substance Use Topics  . Smoking status: Never Smoker   . Smokeless tobacco: Never Used  . Alcohol Use: No   OB History   Grav Para Term Preterm Abortions TAB SAB Ect Mult Living                 Review of Systems  Gastrointestinal: Positive for nausea, vomiting and diarrhea.  Musculoskeletal: Positive for myalgias.  All  other systems reviewed and are negative.     Allergies  Aspirin; Codeine; Niacin; Nsaids; Phenergan fortis; and Sulfonamide derivatives  Home Medications   Prior to Admission medications   Medication Sig Start Date End Date Taking? Authorizing Provider  ALPRAZolam Duanne Moron) 0.5 MG tablet Take 0.5 mg by mouth 2 (two) times daily as needed. For sleep. *Prescribed one tablet three times daily as needed*    Historical Provider, MD  clopidogrel (PLAVIX) 75 MG tablet Take 75 mg by mouth every morning.     Historical Provider, MD  cyclobenzaprine (FLEXERIL) 10 MG tablet Take 10 mg by mouth 2 (two) times daily as needed for muscle spasms.    Historical Provider, MD  DULoxetine (CYMBALTA) 60 MG capsule Take 60 mg by mouth every morning.     Historical Provider, MD  Epoprostenol Sodium (FLOLAN IV) Inject into the vein.  1.5 mg. Inject into the vein continuously. Infuse mg/kg/min via cont IV pump.    Historical Provider, MD  furosemide (LASIX) 40 MG tablet Take 80 mg by mouth daily.     Historical Provider, MD  HYDROmorphone (DILAUDID) 2 MG tablet Take 4 mg by mouth every 4 (four) hours as needed for severe pain.    Historical Provider, MD  insulin glargine (LANTUS) 100 UNIT/ML injection Inject 26 Units into the skin every morning.     Historical Provider, MD  isosorbide mononitrate (IMDUR) 60 MG 24 hr tablet Take 90 mg by mouth every morning.     Historical Provider, MD  levothyroxine (SYNTHROID, LEVOTHROID) 25 MCG tablet Take 25 mcg by mouth every morning.     Historical Provider, MD  metoprolol (TOPROL-XL) 100 MG 24 hr tablet Take 100 mg by mouth every morning.     Historical Provider, MD  naproxen sodium (ALEVE) 220 MG tablet Take 440 mg by mouth daily as needed. *Takes only as needed when other pain medications are not used*    Historical Provider, MD  nitroGLYCERIN (NITROSTAT) 0.4 MG SL tablet Place 0.4 mg under the tongue every 5 (five) minutes as needed for chest pain.     Historical Provider, MD  omeprazole (PRILOSEC) 20 MG capsule Take 20 mg by mouth 2 (two) times daily.      Historical Provider, MD  ondansetron (ZOFRAN) 8 MG tablet Take 8 mg by mouth every 8 (eight) hours as needed. For nausea     Historical Provider, MD  ondansetron (ZOFRAN-ODT) 4 MG disintegrating tablet Take 1 tablet (4 mg total) by mouth every 8 (eight) hours as needed for nausea or vomiting. 02/25/14   Marrion Coy, MD  Oxygen Permeable Lens Products SOLN Inhale into the lungs continuous. 6 liters 12/31/11   Jolaine Artist, MD  potassium chloride SA (K-DUR,KLOR-CON) 20 MEQ tablet Take 40 mEq by mouth daily.  09/02/12   Historical Provider, MD  traMADol (ULTRAM) 50 MG tablet Take 50 mg by mouth every 3 (three) hours as needed. For pain. *takes with Dilaudid (Hydromorphone) 04/22/12   Historical Provider, MD  VYTORIN 10-40 MG per  tablet TAKE ONE TABLET AT BEDTIME 06/26/13   Jolaine Artist, MD   BP 149/78  Pulse 138  Temp(Src) 100.7 F (38.2 C) (Rectal)  Resp 26  Ht 5' (1.524 m)  Wt 155 lb (70.308 kg)  BMI 30.27 kg/m2  SpO2 100% Physical Exam  Constitutional: She is oriented to person, place, and time. She appears well-developed and well-nourished. She appears distressed.  HENT:  Head: Normocephalic and atraumatic.  Right Ear: Hearing normal.  Left Ear: Hearing normal.  Nose: Nose normal.  Mouth/Throat: Oropharynx is clear and moist and mucous membranes are normal.  Eyes: Conjunctivae and EOM are normal. Pupils are equal, round, and reactive to light.  Neck: Normal range of motion. Neck supple.  Cardiovascular: Regular rhythm, S1 normal and S2 normal.  Tachycardia present.  Exam reveals no gallop and no friction rub.   No murmur heard. Pulmonary/Chest: Effort normal and breath sounds normal. No respiratory distress. She exhibits no tenderness.  Abdominal: Soft. Normal appearance. She exhibits no distension. Bowel sounds are decreased. There is no hepatosplenomegaly. There is no tenderness. There is no rebound, no guarding, no tenderness at McBurney's point and negative Murphy's sign. No hernia.  Musculoskeletal: Normal range of motion.  Neurological: She is alert and oriented to person, place, and time. She has normal strength. No cranial nerve deficit or sensory deficit. Coordination normal. GCS eye subscore is 4. GCS verbal subscore is 5. GCS motor subscore is 6.  Skin: Skin is warm, dry and intact. No rash noted. No cyanosis.  Psychiatric: She has a normal mood and affect. Her speech is normal and behavior is normal. Thought content normal.    ED Course  Procedures (including critical care time) Labs Review Labs Reviewed  OCCULT BLOOD GASTRIC / DUODENUM (SPECIMEN CUP) - Abnormal; Notable for the following:    Occult Blood, Gastric POSITIVE (*)    All other components within normal limits  CBC WITH  DIFFERENTIAL - Abnormal; Notable for the following:    WBC 21.3 (*)    HCT 35.7 (*)    Platelets 444 (*)    Neutrophils Relative % 93 (*)    Neutro Abs 19.7 (*)    Lymphocytes Relative 3 (*)    All other components within normal limits  COMPREHENSIVE METABOLIC PANEL - Abnormal; Notable for the following:    Potassium 2.5 (*)    Chloride 91 (*)    Glucose, Bld 216 (*)    Alkaline Phosphatase 123 (*)    GFR calc non Af Amer 56 (*)    GFR calc Af Amer 65 (*)    Anion gap 18 (*)    All other components within normal limits  LIPASE, BLOOD - Abnormal; Notable for the following:    Lipase 9 (*)    All other components within normal limits  URINALYSIS, ROUTINE W REFLEX MICROSCOPIC - Abnormal; Notable for the following:    Glucose, UA 100 (*)    Hgb urine dipstick TRACE (*)    Ketones, ur TRACE (*)    Protein, ur 30 (*)    All other components within normal limits  TROPONIN I - Abnormal; Notable for the following:    Troponin I 0.33 (*)    All other components within normal limits  URINE MICROSCOPIC-ADD ON - Abnormal; Notable for the following:    Squamous Epithelial / LPF MANY (*)    Bacteria, UA FEW (*)    All other components within normal limits  I-STAT CG4 LACTIC ACID, ED - Abnormal; Notable for the following:    Lactic Acid, Venous 2.39 (*)    All other components within normal limits  CULTURE, BLOOD (ROUTINE X 2)  CULTURE, BLOOD (ROUTINE X 2)  PROTIME-INR  POC OCCULT BLOOD, ED  TYPE AND SCREEN    Imaging Review Dg Abd Acute W/chest  06/03/2014   CLINICAL DATA:  Abdominal pain and vomiting.  EXAM: ACUTE ABDOMEN SERIES (ABDOMEN 2 VIEW & CHEST 1 VIEW)  COMPARISON:  02/23/2014 chest  x-ray.  FINDINGS: The right IJ catheter is stable. There is an NG tube tip is in the body region of the stomach. The heart is mildly enlarged but stable. There is tortuosity and calcification of the thoracic aorta. No acute pulmonary findings.  The abdominal bowel gas pattern demonstrates a paucity  of bowel gas. This can occasionally be seen with a small bowel obstruction an fluid-filled loops of small bowel. I do not see any obvious stool or air in the colon. The soft tissue shadows are maintained. No worrisome calcifications. No definite free air. The bony structures are intact.  IMPRESSION: Paucity of abdominal bowel gas. This can occasionally be seen with small bowel obstructions with fluid-filled loops of small bowel.  No free air.  No significant pulmonary findings.   Electronically Signed   By: Kalman Jewels M.D.   On: 06/03/2014 19:31     EKG Interpretation   Date/Time:  Thursday June 03 2014 18:17:10 EDT Ventricular Rate:  120 PR Interval:  125 QRS Duration: 146 QT Interval:  373 QTC Calculation: 527 R Axis:   35 Text Interpretation:  Sinus tachycardia with PACs Right bundle branch  block No significant change since last tracing Confirmed by POLLINA  MD,  CHRISTOPHER [phone]) on 06/03/2014 6:20:57 PM      MDM   Final diagnoses:  None    Patient presents to the ER for evaluation of nausea and vomiting. Patient has had symptoms since yesterday evening. Patient was in distress on arrival, actively vomiting. Her emesis was coffee-ground in nature. This tested positive for blood. Rectal exam reveals brown stool which was also heme positive. Patient's abdominal exam, however, was fairly unremarkable. She did not have any distention or tenderness. The abdominal x-ray shows a paucity of abdominal bowel gas, but no obvious evidence of obstruction. No free air. She does not have any evidence of pneumonia.  Patient was tachycardic upon arrival. This is likely multifactorial. Patient is likely dehydrated from the emesis. She also likely has some element of narcotic withdrawal, she takes Prilosec regularly and has not been ruled out any of her medications. EKG showed sinus tachycardia without any obvious changes from previous EKGs. No obvious ischemia. No ST elevation. Troponin,  however, is elevated. As already noted, patient is not experiencing chest pain currently. She does, however, have significant cardiac history. She is status post bypass and is on Flolan continuous drip per pulmonary arterial hypertension. This is managed at [facility].  Patient did show evidence of fever here in the ER. Source is unclear. She does have a significant leukocytosis. As already noted, however, no evidence of pneumonia, no acute abdominal findings. Urinalysis did not show evidence of infection.  Patient will require hospitalization. I have asked the hospitalist to evaluate her for admission and further evaluation of elevated troponin, vomiting with upper GI bleed.     Orpah Greek, MD 06/03/14 2224

## 2014-06-03 NOTE — ED Notes (Signed)
Pt reports has had n/v/d since 8:30 pm last night.  Reports saw pcp today and was given an injection of something for nausea and another medication.

## 2014-06-03 NOTE — H&P (Signed)
PCP:   Glo Herring., MD   Chief Complaint:  Coffee-ground emesis  HPI: This is a 68 year old Caucasian female with a history of diastolic CHF, coronary artery disease, chronic kidney disease, pulmonary hypertension, diabetes type 2, restrictive lung disease with chronic home oxygen use of 6 L per minute.  She also has chronic pain and is opioid dependent. She presents to the hospital with the onset of nausea and vomiting with upper ground emesis and melena that started last night. This started shortly after eating dinner. Emesis started as stomach contents and progressed to coffee ground emesis. She denies abdominal pain, chest pain, increased shortness of breath, diarrhea, cough. Her husband ate the same food as she did. She did try to take Pepto-Bismol after the melenic stools started. This did not help her nausea and vomiting. She was unable to tolerate oral food or liquid since last night and called EMS to transport the patient here.  She was unable to take her evening medications, which included her Dilaudid. She has continued to use her Flolan pump which is managed by Dr. Gilles Chiquito at [place]. Her cardiologist is Dr. Haroldine Laws.   Review of Systems:  The patient denies anorexia, fever, weight loss,, vision loss, decreased hearing, hoarseness, chest pain, syncope, dyspnea on exertion, peripheral edema, balance deficits, hemoptysis, abdominal pain, melena, hematochezia, severe indigestion/heartburn, hematuria, incontinence, genital sores, muscle weakness, suspicious skin lesions, transient blindness, difficulty walking, depression, unusual weight change, abnormal bleeding, enlarged lymph nodes, angioedema, and breast masses.  Past Medical History: Past Medical History  Diagnosis Date  . Fibromyalgia   . Hypercholesterolemia   . Hypertension     pulmonary  pap 110/31 (mean 62)by RHC 9/10 negative PE by chest CT 2010  . Pulmonary edema     Failed Revatio   . ARF (acute renal failure)      poor candidate for ACE -I or ARB  . Restrictive lung disease     obesity hypoventilation syndrome  . Renal insufficiency   . CAD (coronary artery disease)     multivessel DES x 2 RCA 2007  . Chronic diastolic heart failure   . MRSA (methicillin resistant staphylococcus aureus) pneumonia   . Pulmonary hypertension   . Chronic anemia     anemia of chronic disease based on anemia panel 2011  . Pancreatic mass 08/01/2012  . PONV (postoperative nausea and vomiting)   . Heart murmur     "when I was small"  . CHF (congestive heart failure)   . Anginal pain   . Pneumonia X 3  . Type II diabetes mellitus   . Hypothyroidism   . H/O hiatal hernia   . GERD (gastroesophageal reflux disease)   . Anxiety   . On home oxygen therapy     "6L all the time" (02/24/2014   Past Surgical History  Procedure Laterality Date  . Coronary artery bypass graft  1997    LIMA to LAD,SVG to OM  . Esophagogastroduodenoscopy  06/28/10    schatzki ring otherwise normal;small hiatal hernia  . Coronary angioplasty  1993  . Coronary angioplasty with stent placement  ~ 1996; 2008    "think I have 3 stents"  . Abdominal hysterectomy  ~ 1986  . Dilation and curettage of uterus  ~ 1984  . Tubal ligation      Medications: Prior to Admission medications   Medication Sig Start Date End Date Taking? Authorizing Provider  ALPRAZolam Duanne Moron) 0.5 MG tablet Take 1 mg by mouth at bedtime. For  sleep. *Prescribed one tablet three times daily as needed*   Yes Historical Provider, MD  clopidogrel (PLAVIX) 75 MG tablet Take 75 mg by mouth every morning.    Yes Historical Provider, MD  DULoxetine (CYMBALTA) 30 MG capsule Take 30 mg by mouth every morning.   Yes Historical Provider, MD  DULoxetine (CYMBALTA) 60 MG capsule Take 60 mg by mouth every morning.    Yes Historical Provider, MD  Epoprostenol Sodium (FLOLAN IV) Inject into the vein. 1.5 mg. Inject into the vein continuously. Infuse mg/kg/min via cont IV pump.   Yes  Historical Provider, MD  ezetimibe-simvastatin (VYTORIN) 10-40 MG per tablet Take 1 tablet by mouth every evening.   Yes Historical Provider, MD  furosemide (LASIX) 40 MG tablet Take 80 mg by mouth daily.    Yes Historical Provider, MD  HYDROmorphone (DILAUDID) 2 MG tablet Take 4 mg by mouth 2 (two) times daily.    Yes Historical Provider, MD  insulin glargine (LANTUS) 100 UNIT/ML injection Inject 26 Units into the skin every morning.    Yes Historical Provider, MD  isosorbide mononitrate (IMDUR) 60 MG 24 hr tablet Take 90 mg by mouth every morning.    Yes Historical Provider, MD  levothyroxine (SYNTHROID, LEVOTHROID) 25 MCG tablet Take 25 mcg by mouth every morning.    Yes Historical Provider, MD  metoprolol (TOPROL-XL) 100 MG 24 hr tablet Take 100 mg by mouth every morning.    Yes Historical Provider, MD  naproxen sodium (ALEVE) 220 MG tablet Take 440 mg by mouth daily as needed. *Takes only as needed when other pain medications are not used*   Yes Historical Provider, MD  nitroGLYCERIN (NITROSTAT) 0.4 MG SL tablet Place 0.4 mg under the tongue every 5 (five) minutes as needed for chest pain.    Yes Historical Provider, MD  omeprazole (PRILOSEC) 20 MG capsule Take 20 mg by mouth 2 (two) times daily.     Yes Historical Provider, MD  ondansetron (ZOFRAN) 8 MG tablet Take 8 mg by mouth 2 (two) times daily. For nausea   Yes Historical Provider, MD  Oxygen Permeable Lens Products SOLN Inhale into the lungs continuous. 6 liters 12/31/11  Yes Jolaine Artist, MD  Oxymetazoline HCl (NASAL SPRAY) 0.05 % SOLN Place 1 spray into the nose as needed (for congestion).   Yes Historical Provider, MD  potassium chloride SA (K-DUR,KLOR-CON) 20 MEQ tablet Take 40 mEq by mouth daily.  09/02/12  Yes Historical Provider, MD  Probiotic Product (FLORAJEN3 PO) Take 1 tablet by mouth daily.   Yes Historical Provider, MD  traMADol (ULTRAM) 50 MG tablet Take 50 mg by mouth every 3 (three) hours as needed. For pain. *takes  with Dilaudid (Hydromorphone) 04/22/12  Yes Historical Provider, MD    Allergies:   Allergies  Allergen Reactions  . Aspirin     REACTION: Intolerance  . Codeine Other (See Comments)    Messes with digestive system  . Niacin Other (See Comments)    Pins sticking into skin  . Nsaids Other (See Comments)    REACTION: Aggravates Digestive System   . Phenergan Fortis [Promethazine] Other (See Comments)    Family and pt states becomes very confused and wild.   . Sulfonamide Derivatives Other (See Comments)    Messes with digestive system    Social History:  reports that she has never smoked. She has never used smokeless tobacco. She reports that she does not drink alcohol or use illicit drugs.  Family History: Family History  Problem Relation Age of Onset  . Cancer Mother     oral  . Heart attack Father   . COPD Brother   . Heart disease Brother     Physical Exam: Filed Vitals:   06/03/14 2112 06/03/14 2130 06/03/14 2200 06/03/14 2230  BP: 108/49 135/60 123/63 143/66  Pulse: 126 128 129   Temp:      TempSrc:      Resp:      Height:      Weight:      SpO2: 100% 100% 100%    General appearance: alert, cooperative and no distress Head: Normocephalic, without obvious abnormality, atraumatic Eyes: conjunctivae/corneas clear. PERRL, EOM's intact. Fundi benign. Ears: normal TM's and external ear canals both ears Throat: Black flecks on soft palate. No mucosal lesions. Neck: no adenopathy, no carotid bruit, no JVD, supple, symmetrical, trachea midline and thyroid not enlarged, symmetric, no tenderness/mass/nodules Resp: clear to auscultation bilaterally Cardio: regular rate and rhythm, S1, S2 normal, no murmur, click, rub or gallop GI: Soft, epigastric and left upper quadrant tenderness. There is no rebound tenderness. Extremities: extremities normal, atraumatic, no cyanosis or edema Pulses: 2+ and symmetric Lymph nodes: Cervical, supraclavicular, and axillary nodes  normal.   Labs on Admission:   Recent Labs  06/03/14 1750  NA 141  K 2.5*  CL 91*  CO2 32  GLUCOSE 216*  BUN 12  CREATININE 1.00  CALCIUM 10.1    Recent Labs  06/03/14 1750  AST 22  ALT 11  ALKPHOS 123*  BILITOT 0.6  PROT 8.0  ALBUMIN 4.0    Recent Labs  06/03/14 1750  LIPASE 9*    Recent Labs  06/03/14 1750  WBC 21.3*  NEUTROABS 19.7*  HGB 12.0  HCT 35.7*  MCV 85.6  PLT 444*    Recent Labs  06/03/14 1750  TROPONINI 0.33*   No results found for this basename: TSH, T4TOTAL, FREET3, T3FREE, THYROIDAB,  in the last 72 hours No results found for this basename: VITAMINB12, FOLATE, FERRITIN, TIBC, IRON, RETICCTPCT,  in the last 72 hours  Radiological Exams on Admission: Dg Abd Acute W/chest  06/03/2014   CLINICAL DATA:  Abdominal pain and vomiting.  EXAM: ACUTE ABDOMEN SERIES (ABDOMEN 2 VIEW & CHEST 1 VIEW)  COMPARISON:  02/23/2014 chest x-ray.  FINDINGS: The right IJ catheter is stable. There is an NG tube tip is in the body region of the stomach. The heart is mildly enlarged but stable. There is tortuosity and calcification of the thoracic aorta. No acute pulmonary findings.  The abdominal bowel gas pattern demonstrates a paucity of bowel gas. This can occasionally be seen with a small bowel obstruction an fluid-filled loops of small bowel. I do not see any obvious stool or air in the colon. The soft tissue shadows are maintained. No worrisome calcifications. No definite free air. The bony structures are intact.  IMPRESSION: Paucity of abdominal bowel gas. This can occasionally be seen with small bowel obstructions with fluid-filled loops of small bowel.  No free air.  No significant pulmonary findings.   Electronically Signed   By: Kalman Jewels M.D.   On: 06/03/2014 19:31    Assessment/Plan Present on Admission:  . Upper GI bleed . Fever . Elevated troponin . Hypokalemia  #1 upper GI bleed  - Admit patient to MedSurg.  Patient is currently  hemodynamically stable and does not need an emergent endoscopy to determine source of bleeding. We'll continue to monitor the patient's hemoglobin and hematocrit and  start the patient on Protonix drip. Will keep the patient n.p.o. and continue the patient's NG tube at low intermittent suction. As patient n.p.o., we'll transition medications to IV form. I anticipate that the patient will need to stay for 2 midnights.  #2: Fever with leukocytosis  -Source not clear. Patient is hemodynamically stable.  Blood and urine cultures have been drawn. As fever is low grade, will monitor and if antipyretics  #3: Elevated troponin - troponin at 0.33  - EKG unchanged and without ST changes. We'll continue to follow serial troponins.   #4: Hypokalemia  - We'll replace potassium and check magnesium in a.m.  #5: Diabetes type 2  - Give Lantus at half dose as the patient n.p.o.  #6: DVT prophylaxis - SCDs. #7: Code Status: Full code  Greater than 70 minutes was spent on admission of this patient.  Loma Boston, DO 06/03/2014, 11:02 PM

## 2014-06-03 NOTE — ED Notes (Signed)
Discussed CT contrast with Dr Betsey Holiday, Instructed that pt to just have IV contrast- CT informed

## 2014-06-03 NOTE — ED Notes (Signed)
Husband out to desk requesting pt have something for pain.Dr Betsey Holiday informed

## 2014-06-04 ENCOUNTER — Encounter (HOSPITAL_COMMUNITY): Payer: Self-pay | Admitting: *Deleted

## 2014-06-04 ENCOUNTER — Inpatient Hospital Stay (HOSPITAL_COMMUNITY): Payer: Medicare Other

## 2014-06-04 DIAGNOSIS — I5032 Chronic diastolic (congestive) heart failure: Secondary | ICD-10-CM

## 2014-06-04 DIAGNOSIS — I2789 Other specified pulmonary heart diseases: Secondary | ICD-10-CM

## 2014-06-04 DIAGNOSIS — I214 Non-ST elevation (NSTEMI) myocardial infarction: Secondary | ICD-10-CM

## 2014-06-04 LAB — COMPREHENSIVE METABOLIC PANEL
ALBUMIN: 3.3 g/dL — AB (ref 3.5–5.2)
ALT: 9 U/L (ref 0–35)
AST: 26 U/L (ref 0–37)
Alkaline Phosphatase: 98 U/L (ref 39–117)
Anion gap: 13 (ref 5–15)
BILIRUBIN TOTAL: 0.5 mg/dL (ref 0.3–1.2)
BUN: 11 mg/dL (ref 6–23)
CO2: 36 mEq/L — ABNORMAL HIGH (ref 19–32)
CREATININE: 1.13 mg/dL — AB (ref 0.50–1.10)
Calcium: 9.2 mg/dL (ref 8.4–10.5)
Chloride: 97 mEq/L (ref 96–112)
GFR calc Af Amer: 56 mL/min — ABNORMAL LOW (ref >90)
GFR calc non Af Amer: 48 mL/min — ABNORMAL LOW (ref >90)
Glucose, Bld: 99 mg/dL (ref 70–99)
Potassium: 3 mEq/L — ABNORMAL LOW (ref 3.7–5.3)
Sodium: 146 mEq/L (ref 137–147)
TOTAL PROTEIN: 6.5 g/dL (ref 6.0–8.3)

## 2014-06-04 LAB — CBC WITH DIFFERENTIAL/PLATELET
BASOS ABS: 0 10*3/uL (ref 0.0–0.1)
BASOS PCT: 0 % (ref 0–1)
Eosinophils Absolute: 0 10*3/uL (ref 0.0–0.7)
Eosinophils Relative: 0 % (ref 0–5)
HEMATOCRIT: 33.4 % — AB (ref 36.0–46.0)
Hemoglobin: 10.8 g/dL — ABNORMAL LOW (ref 12.0–15.0)
Lymphocytes Relative: 4 % — ABNORMAL LOW (ref 12–46)
Lymphs Abs: 0.8 10*3/uL (ref 0.7–4.0)
MCH: 28.3 pg (ref 26.0–34.0)
MCHC: 32.3 g/dL (ref 30.0–36.0)
MCV: 87.4 fL (ref 78.0–100.0)
MONO ABS: 1.6 10*3/uL — AB (ref 0.1–1.0)
Monocytes Relative: 7 % (ref 3–12)
NEUTROS PCT: 89 % — AB (ref 43–77)
Neutro Abs: 19.2 10*3/uL — ABNORMAL HIGH (ref 1.7–7.7)
Platelets: 400 10*3/uL (ref 150–400)
RBC: 3.82 MIL/uL — ABNORMAL LOW (ref 3.87–5.11)
RDW: 13.5 % (ref 11.5–15.5)
WBC: 21.6 10*3/uL — ABNORMAL HIGH (ref 4.0–10.5)

## 2014-06-04 LAB — MRSA PCR SCREENING: MRSA by PCR: NEGATIVE

## 2014-06-04 LAB — GLUCOSE, CAPILLARY
GLUCOSE-CAPILLARY: 111 mg/dL — AB (ref 70–99)
GLUCOSE-CAPILLARY: 142 mg/dL — AB (ref 70–99)
Glucose-Capillary: 112 mg/dL — ABNORMAL HIGH (ref 70–99)
Glucose-Capillary: 159 mg/dL — ABNORMAL HIGH (ref 70–99)

## 2014-06-04 LAB — CBC
HCT: 32.7 % — ABNORMAL LOW (ref 36.0–46.0)
HCT: 38.7 % (ref 36.0–46.0)
HEMOGLOBIN: 10.6 g/dL — AB (ref 12.0–15.0)
HEMOGLOBIN: 12.5 g/dL (ref 12.0–15.0)
MCH: 28.1 pg (ref 26.0–34.0)
MCH: 28.2 pg (ref 26.0–34.0)
MCHC: 32.4 g/dL (ref 30.0–36.0)
MCHC: 32.3 g/dL (ref 30.0–36.0)
MCV: 86.7 fL (ref 78.0–100.0)
MCV: 87.4 fL (ref 78.0–100.0)
Platelets: 359 10*3/uL (ref 150–400)
Platelets: 386 10*3/uL (ref 150–400)
RBC: 3.77 MIL/uL — AB (ref 3.87–5.11)
RBC: 4.43 MIL/uL (ref 3.87–5.11)
RDW: 13.5 % (ref 11.5–15.5)
RDW: 13.7 % (ref 11.5–15.5)
WBC: 22.6 10*3/uL — AB (ref 4.0–10.5)
WBC: 26.2 10*3/uL — ABNORMAL HIGH (ref 4.0–10.5)

## 2014-06-04 LAB — PHOSPHORUS: PHOSPHORUS: 4.2 mg/dL (ref 2.3–4.6)

## 2014-06-04 LAB — MAGNESIUM: MAGNESIUM: 1.7 mg/dL (ref 1.5–2.5)

## 2014-06-04 LAB — OCCULT BLOOD X 1 CARD TO LAB, STOOL: FECAL OCCULT BLD: POSITIVE — AB

## 2014-06-04 LAB — TROPONIN I
TROPONIN I: 3.49 ng/mL — AB (ref <0.30)
Troponin I: 3.55 ng/mL (ref <0.30)

## 2014-06-04 LAB — OCCULT BLOOD, POC DEVICE: FECAL OCCULT BLD: POSITIVE — AB

## 2014-06-04 MED ORDER — ONDANSETRON HCL 4 MG/2ML IJ SOLN
4.0000 mg | INTRAMUSCULAR | Status: DC | PRN
  Administered 2014-06-04 – 2014-06-09 (×18): 4 mg via INTRAVENOUS
  Filled 2014-06-04 (×19): qty 2

## 2014-06-04 MED ORDER — SODIUM CHLORIDE 0.9 % IV SOLN
8.0000 mg/h | INTRAVENOUS | Status: DC
  Administered 2014-06-04 – 2014-06-05 (×5): 8 mg/h via INTRAVENOUS
  Filled 2014-06-04 (×13): qty 80

## 2014-06-04 MED ORDER — METOPROLOL TARTRATE 1 MG/ML IV SOLN
5.0000 mg | Freq: Four times a day (QID) | INTRAVENOUS | Status: DC
  Administered 2014-06-04 (×2): 5 mg via INTRAVENOUS
  Filled 2014-06-04 (×2): qty 5

## 2014-06-04 MED ORDER — SODIUM CHLORIDE 0.9 % IV SOLN
80.0000 mg | Freq: Once | INTRAVENOUS | Status: AC
  Administered 2014-06-04: 80 mg via INTRAVENOUS
  Filled 2014-06-04: qty 80

## 2014-06-04 MED ORDER — INSULIN GLARGINE 100 UNIT/ML ~~LOC~~ SOLN
12.0000 [IU] | Freq: Every morning | SUBCUTANEOUS | Status: DC
  Administered 2014-06-07 – 2014-06-09 (×3): 12 [IU] via SUBCUTANEOUS
  Filled 2014-06-04 (×8): qty 0.12

## 2014-06-04 MED ORDER — PANTOPRAZOLE SODIUM 40 MG IV SOLR
40.0000 mg | Freq: Two times a day (BID) | INTRAVENOUS | Status: DC
  Filled 2014-06-04 (×2): qty 40

## 2014-06-04 MED ORDER — HYDRALAZINE HCL 20 MG/ML IJ SOLN
10.0000 mg | Freq: Four times a day (QID) | INTRAMUSCULAR | Status: DC | PRN
  Administered 2014-06-04 – 2014-06-05 (×2): 10 mg via INTRAVENOUS
  Filled 2014-06-04 (×2): qty 1

## 2014-06-04 MED ORDER — CETYLPYRIDINIUM CHLORIDE 0.05 % MT LIQD
7.0000 mL | Freq: Two times a day (BID) | OROMUCOSAL | Status: DC
  Administered 2014-06-04 – 2014-06-09 (×9): 7 mL via OROMUCOSAL

## 2014-06-04 MED ORDER — PANTOPRAZOLE SODIUM 40 MG IV SOLR
INTRAVENOUS | Status: AC
  Filled 2014-06-04: qty 80

## 2014-06-04 MED ORDER — ONDANSETRON HCL 4 MG/2ML IJ SOLN
4.0000 mg | Freq: Four times a day (QID) | INTRAMUSCULAR | Status: DC
  Administered 2014-06-04 (×2): 4 mg via INTRAVENOUS
  Filled 2014-06-04 (×2): qty 2

## 2014-06-04 MED ORDER — PH 12 STERILE DILUENT
33.5000 ng/kg/min | INTRAVENOUS | Status: DC
  Administered 2014-06-04: 33.5 ng/kg/min via INTRAVENOUS

## 2014-06-04 MED ORDER — ONDANSETRON HCL 4 MG/2ML IJ SOLN: 4.0000 mg | Freq: Four times a day (QID) | INTRAMUSCULAR | Status: DC | PRN

## 2014-06-04 MED ORDER — METOPROLOL SUCCINATE ER 100 MG PO TB24
100.0000 mg | ORAL_TABLET | Freq: Every day | ORAL | Status: DC
  Administered 2014-06-04 – 2014-06-09 (×6): 100 mg via ORAL
  Filled 2014-06-04 (×6): qty 1

## 2014-06-04 MED ORDER — POTASSIUM CHLORIDE 10 MEQ/100ML IV SOLN
10.0000 meq | INTRAVENOUS | Status: AC
  Administered 2014-06-04 (×4): 10 meq via INTRAVENOUS
  Filled 2014-06-04 (×5): qty 100

## 2014-06-04 MED ORDER — ACETAMINOPHEN 325 MG PO TABS
650.0000 mg | ORAL_TABLET | Freq: Four times a day (QID) | ORAL | Status: DC | PRN
  Administered 2014-06-04: 650 mg via ORAL
  Filled 2014-06-04: qty 2

## 2014-06-04 MED ORDER — CHLORHEXIDINE GLUCONATE 0.12 % MT SOLN
15.0000 mL | Freq: Two times a day (BID) | OROMUCOSAL | Status: DC
  Administered 2014-06-04 – 2014-06-09 (×7): 15 mL via OROMUCOSAL
  Filled 2014-06-04 (×12): qty 15

## 2014-06-04 MED ORDER — EPOPROSTENOL SODIUM 1.5 MG IV SOLR: 33.5000 ng/kg/min | INTRAVENOUS | Status: DC

## 2014-06-04 MED ORDER — ASPIRIN EC 81 MG PO TBEC
81.0000 mg | DELAYED_RELEASE_TABLET | Freq: Every day | ORAL | Status: DC
  Administered 2014-06-04 – 2014-06-09 (×6): 81 mg via ORAL
  Filled 2014-06-04 (×7): qty 1

## 2014-06-04 MED ORDER — FUROSEMIDE 10 MG/ML IJ SOLN: 40.0000 mg | Freq: Every day | INTRAMUSCULAR | Status: DC

## 2014-06-04 MED ORDER — NITROGLYCERIN 0.4 MG SL SUBL
0.4000 mg | SUBLINGUAL_TABLET | SUBLINGUAL | Status: DC | PRN
  Administered 2014-06-04: 0.4 mg via SUBLINGUAL
  Filled 2014-06-04 (×2): qty 1

## 2014-06-04 MED ORDER — HYDROMORPHONE HCL PF 1 MG/ML IJ SOLN
1.0000 mg | Freq: Once | INTRAMUSCULAR | Status: AC
  Administered 2014-06-04: 1 mg via INTRAVENOUS
  Filled 2014-06-04: qty 1

## 2014-06-04 MED ORDER — EPOPROSTENOL SODIUM 1.5 MG IV SOLR: 14.8800 ng/kg/min | INTRAVENOUS | Status: DC

## 2014-06-04 MED ORDER — ACETAMINOPHEN 650 MG RE SUPP: 650.0000 mg | Freq: Four times a day (QID) | RECTAL | Status: DC | PRN

## 2014-06-04 MED ORDER — LORAZEPAM 2 MG/ML IJ SOLN
0.5000 mg | Freq: Four times a day (QID) | INTRAMUSCULAR | Status: DC | PRN
  Administered 2014-06-04 – 2014-06-08 (×9): 0.5 mg via INTRAVENOUS
  Filled 2014-06-04 (×9): qty 1

## 2014-06-04 MED ORDER — ONDANSETRON HCL 4 MG/5ML PO SOLN
4.0000 mg | Freq: Four times a day (QID) | ORAL | Status: DC | PRN
  Filled 2014-06-04: qty 5

## 2014-06-04 MED ORDER — INSULIN ASPART 100 UNIT/ML ~~LOC~~ SOLN
0.0000 [IU] | Freq: Three times a day (TID) | SUBCUTANEOUS | Status: DC
  Administered 2014-06-04 – 2014-06-05 (×2): 1 [IU] via SUBCUTANEOUS
  Administered 2014-06-05: 3 [IU] via SUBCUTANEOUS
  Administered 2014-06-06: 1 [IU] via SUBCUTANEOUS
  Administered 2014-06-07: 3 [IU] via SUBCUTANEOUS
  Administered 2014-06-07 – 2014-06-08 (×2): 2 [IU] via SUBCUTANEOUS

## 2014-06-04 MED ORDER — HYDROMORPHONE HCL PF 1 MG/ML IJ SOLN
0.5000 mg | INTRAMUSCULAR | Status: DC | PRN
  Administered 2014-06-04 – 2014-06-06 (×10): 0.5 mg via INTRAVENOUS
  Filled 2014-06-04 (×10): qty 1

## 2014-06-04 MED ORDER — POTASSIUM CHLORIDE 10 MEQ/100ML IV SOLN
10.0000 meq | INTRAVENOUS | Status: AC
  Administered 2014-06-04 (×4): 10 meq via INTRAVENOUS
  Filled 2014-06-04 (×4): qty 100

## 2014-06-04 MED ORDER — ATORVASTATIN CALCIUM 20 MG PO TABS
20.0000 mg | ORAL_TABLET | Freq: Every day | ORAL | Status: DC
  Administered 2014-06-06 – 2014-06-08 (×3): 20 mg via ORAL
  Filled 2014-06-04 (×5): qty 1

## 2014-06-04 MED ORDER — ALUM & MAG HYDROXIDE-SIMETH 200-200-20 MG/5ML PO SUSP
30.0000 mL | Freq: Four times a day (QID) | ORAL | Status: DC | PRN
  Administered 2014-06-07 – 2014-06-09 (×2): 30 mL via ORAL
  Filled 2014-06-04 (×2): qty 30

## 2014-06-04 MED ORDER — LEVOTHYROXINE SODIUM 100 MCG IV SOLR
12.5000 ug | Freq: Every day | INTRAVENOUS | Status: DC
  Administered 2014-06-04 – 2014-06-08 (×5): 12.5 ug via INTRAVENOUS
  Filled 2014-06-04 (×7): qty 5

## 2014-06-04 MED ORDER — INSULIN GLARGINE 100 UNIT/ML ~~LOC~~ SOLN
13.0000 [IU] | Freq: Every morning | SUBCUTANEOUS | Status: DC
  Filled 2014-06-04: qty 0.13

## 2014-06-04 MED ORDER — DOCUSATE SODIUM 100 MG PO CAPS
100.0000 mg | ORAL_CAPSULE | Freq: Every day | ORAL | Status: DC | PRN
  Filled 2014-06-04: qty 1

## 2014-06-04 MED ORDER — METOCLOPRAMIDE HCL 5 MG/ML IJ SOLN
10.0000 mg | Freq: Four times a day (QID) | INTRAMUSCULAR | Status: DC | PRN
  Administered 2014-06-04 – 2014-06-08 (×8): 10 mg via INTRAVENOUS
  Filled 2014-06-04 (×9): qty 2

## 2014-06-04 NOTE — Consult Note (Signed)
Advanced Heart Failure Team Consult Note  Referring Physician:  Primary Physician: Primary Cardiologist:    Reason for Consultation:   HPI:    Meagan [name] is a 68 y/o woman with multiple medical problems including CAD s/p CABG and RCA stent, DM2, obesity, fibromyalgia, diastolic HF. She has severe PAH and is on Flolan 38.3 ng/kg/min followed by Dr. Gilles Chiquito at [facility]. We are asked to consult due to elevated troponin.   She tells me she has felt bad all week. Mostly just weak and fatigued. Denies CP but has had chronic SOB. No orthopnea, PND or LE edema. Yesterday had onset of nausea and vomiting with upper ground emesis and melena that started last night. This started shortly after eating dinner. Emesis started as stomach contents and progressed to coffee ground emesis. She denies abdominal pain or diarrhea. Went to see Dr. Gerarda Fraction who gave her some Zofran but symptoms persisted so came to ER. She has continued to use her Flolan pump which is managed by Dr. Gilles Chiquito at [facility].   On arrival to ER had CT abdomen which showed R renal mass and thickening of upper urinary tract. No other pathology. WBC 26.2. Hgb 12.5. Trop 3.55  Initial ECG with ST 120s and chronic RBBB. No acute ST-T changes. HR was up to 140 overnight which looks like sinus but cannot exclude AFL. HR now back down.   Recent cath 02/24/14 There was no signficant gradient across the aortic valve on pullback.  Left main: Heavily calcified. Distal 40-50%  LAD: Diffuse severe disease. Totaled in midsection. Mid to distal vessel fills from LIMA. 2 diagonals with diffuse disease. There is a 70-80% lesion in the midsection of the 2nd diagonal  LCX: Small heavily diseased vessel. 30-40% ostial. Long 95% lesion in mid AV groove after take-off of OM-1. Distal vessel small and diffusely diseased. OM-1 mild plaque. OM-2 and OM-3 occluded  RCA: Ostial spasm. Long stent in midsection is patent. Distal RCA 40% before PDA. In Mid PDA50% lesion. Mild  to moderate plaque in PLs. R to L collaterals filling portion of LCX and septals  LIMA-LAD: widely patent with 70-75% eccentric lesion in LAD after insertion of LIMA. The apical LAD is occluded.  SVG-OM: totally occluded proximally (chronic)  LV-gram done in the RAO projection: Ejection fraction = 60% No wall motion abnormalities    Review of Systems: [y] = yes, [ ]  = no   General: Weight gain [ ] ; Weight loss [ ] ; Anorexia [ y]; Fatigue [ y]; Fever [ ] ; Chills [ ] ; Weakness [ ]   Cardiac: Chest pain/pressure [ ] ; Resting SOB [ ] ; Exertional SOB [name] ]; Orthopnea [ ] ; Pedal Edema [ ] ; Palpitations [ ] ; Syncope [ ] ; Presyncope [ ] ; Paroxysmal nocturnal dyspnea[ ]   Pulmonary: Cough [ ] ; Wheezing[ ] ; Hemoptysis[ ] ; Sputum [ ] ; Snoring [ ]   GI: Vomiting[y ]; Dysphagia[ ] ; Melena[y ]; Hematochezia [ ] ; Heartburn[ ] ; Abdominal pain [ ] ; Constipation [ ] ; Diarrhea [ ] ; BRBPR [ ]   GU: Hematuria[ ] ; Dysuria [ ] ; Nocturia[ ]   Vascular: Pain in legs with walking [ ] ; Pain in feet with lying flat [ ] ; Non-healing sores [ ] ; Stroke [ ] ; TIA [ ] ; Slurred speech [ ] ;  Neuro: Headaches[ ] ; Vertigo[ ] ; Seizures[ ] ; Paresthesias[ ] ;Blurred vision [ ] ; Diplopia [ ] ; Vision changes [ ]   Ortho/Skin: Arthritis [name] ]; Joint pain [name] ]; Muscle pain [ ] ; Joint swelling [ ] ; Back Pain [ ] ; Rash [ ]   Psych:  Depression[y ]; Anxiety[ ]   Heme: Bleeding problems [ ] ; Clotting disorders [ ] ; Anemia [name] ]  Endocrine: Diabetes [ y]; Thyroid dysfunction[ ]   Home Medications Prior to Admission medications   Medication Sig Start Date End Date Taking? Authorizing Provider  ALPRAZolam Duanne Moron) 0.5 MG tablet Take 1 mg by mouth at bedtime. For sleep. *Prescribed one tablet three times daily as needed*   Yes Historical Provider, MD  clopidogrel (PLAVIX) 75 MG tablet Take 75 mg by mouth every morning.    Yes Historical Provider, MD  DULoxetine (CYMBALTA) 30 MG capsule Take 30 mg by mouth every morning.   Yes Historical Provider, MD   DULoxetine (CYMBALTA) 60 MG capsule Take 60 mg by mouth every morning.    Yes Historical Provider, MD  Epoprostenol Sodium (FLOLAN IV) Inject into the vein. 1.5 mg. Inject into the vein continuously. Infuse mg/kg/min via cont IV pump.   Yes Historical Provider, MD  ezetimibe-simvastatin (VYTORIN) 10-40 MG per tablet Take 1 tablet by mouth every evening.   Yes Historical Provider, MD  furosemide (LASIX) 40 MG tablet Take 80 mg by mouth daily.    Yes Historical Provider, MD  HYDROmorphone (DILAUDID) 2 MG tablet Take 4 mg by mouth 2 (two) times daily.    Yes Historical Provider, MD  insulin glargine (LANTUS) 100 UNIT/ML injection Inject 26 Units into the skin every morning.    Yes Historical Provider, MD  isosorbide mononitrate (IMDUR) 60 MG 24 hr tablet Take 90 mg by mouth every morning.    Yes Historical Provider, MD  levothyroxine (SYNTHROID, LEVOTHROID) 25 MCG tablet Take 25 mcg by mouth every morning.    Yes Historical Provider, MD  metoprolol (TOPROL-XL) 100 MG 24 hr tablet Take 100 mg by mouth every morning.    Yes Historical Provider, MD  naproxen sodium (ALEVE) 220 MG tablet Take 440 mg by mouth daily as needed. *Takes only as needed when other pain medications are not used*   Yes Historical Provider, MD  nitroGLYCERIN (NITROSTAT) 0.4 MG SL tablet Place 0.4 mg under the tongue every 5 (five) minutes as needed for chest pain.    Yes Historical Provider, MD  omeprazole (PRILOSEC) 20 MG capsule Take 20 mg by mouth 2 (two) times daily.     Yes Historical Provider, MD  ondansetron (ZOFRAN) 8 MG tablet Take 8 mg by mouth 2 (two) times daily. For nausea   Yes Historical Provider, MD  Oxygen Permeable Lens Products SOLN Inhale into the lungs continuous. 6 liters 12/31/11  Yes Jolaine Artist, MD  Oxymetazoline HCl (NASAL SPRAY) 0.05 % SOLN Place 1 spray into the nose as needed (for congestion).   Yes Historical Provider, MD  potassium chloride SA (K-DUR,KLOR-CON) 20 MEQ tablet Take 40 mEq by mouth  daily.  09/02/12  Yes Historical Provider, MD  Probiotic Product (FLORAJEN3 PO) Take 1 tablet by mouth daily.   Yes Historical Provider, MD  traMADol (ULTRAM) 50 MG tablet Take 50 mg by mouth every 3 (three) hours as needed. For pain. *takes with Dilaudid (Hydromorphone) 04/22/12  Yes Historical Provider, MD    Past Medical History: Past Medical History  Diagnosis Date  . Fibromyalgia   . Hypercholesterolemia   . Hypertension     pulmonary  pap 110/31 (mean 62)by [facility] 9/10 negative PE by chest CT 2010  . Pulmonary edema     Failed Revatio   . ARF (acute renal failure)     poor candidate for ACE -I or ARB  . Restrictive  lung disease     obesity hypoventilation syndrome  . Renal insufficiency   . CAD (coronary artery disease)     multivessel DES x 2 RCA 2007  . Chronic diastolic heart failure   . MRSA (methicillin resistant staphylococcus aureus) pneumonia   . Pulmonary hypertension   . Chronic anemia     anemia of chronic disease based on anemia panel 2011  . Pancreatic mass 08/01/2012  . PONV (postoperative nausea and vomiting)   . Heart murmur     "when I was small"  . CHF (congestive heart failure)   . Anginal pain   . Pneumonia X 3  . Type II diabetes mellitus   . Hypothyroidism   . H/O hiatal hernia   . GERD (gastroesophageal reflux disease)   . Anxiety   . On home oxygen therapy     "6L all the time" (02/24/2014    Past Surgical History: Past Surgical History  Procedure Laterality Date  . Coronary artery bypass graft  1997    LIMA to LAD,SVG to OM  . Esophagogastroduodenoscopy  06/28/10    schatzki ring otherwise normal;small hiatal hernia  . Coronary angioplasty  1993  . Coronary angioplasty with stent placement  ~ 1996; 2008    "think I have 3 stents"  . Abdominal hysterectomy  ~ 1986  . Dilation and curettage of uterus  ~ 1984  . Tubal ligation      Family History: Family History  Problem Relation Age of Onset  . Cancer Mother     oral  . Heart attack  Father   . COPD Brother   . Heart disease Brother     Social History: History   Social History  . Marital Status: Married    Spouse Name: N/A    Number of Children: N/A  . Years of Education: N/A   Social History Main Topics  . Smoking status: Never Smoker   . Smokeless tobacco: Never Used  . Alcohol Use: No  . Drug Use: No  . Sexual Activity: Not Currently   Other Topics Concern  . None   Social History Narrative  . None    Allergies:  Allergies  Allergen Reactions  . Aspirin     REACTION: Intolerance  . Codeine Other (See Comments)    Messes with digestive system  . Niacin Other (See Comments)    Pins sticking into skin  . Nsaids Other (See Comments)    REACTION: Aggravates Digestive System   . Phenergan Fortis [Promethazine] Other (See Comments)    Family and pt states becomes very confused and wild.   . Sulfonamide Derivatives Other (See Comments)    Messes with digestive system    Objective:    Vital Signs:   Temp:  [98.5 F (36.9 C)-100.9 F (38.3 C)] 99.7 F (37.6 C) (08/14 1152) Pulse Rate:  [101-145] 101 (08/14 0415) Resp:  [14-35] 31 (08/14 1530) BP: (72-184)/(36-97) 150/97 mmHg (08/14 1530) SpO2:  [89 %-100 %] 99 % (08/14 1530) Weight:  [68.9 kg (151 lb 14.4 oz)-70.308 kg (155 lb)] 68.9 kg (151 lb 14.4 oz) (08/13 2330) Last BM Date: 06/03/14  Weight change: Filed Weights   06/03/14 1733 06/03/14 2330  Weight: 70.308 kg (155 lb) 68.9 kg (151 lb 14.4 oz)    Intake/Output:   Intake/Output Summary (Last 24 hours) at 06/04/14 1651 Last data filed at 06/04/14 1130  Gross per 24 hour  Intake 627.08 ml  Output    952 ml  Net -  324.92 ml     Physical Exam: General: Chronically-ill appearing. Lying flat in bed No distress. Wearing  [place] O2  HEENT: normal  Neck: thick . JVP ~7 Carotids 2+ bilat; no bruits. No lymphadenopathy or thryomegaly appreciated.  Cor: PMI nonpalpable Regular tachy. 2/6 TR. P2 perhaps mildly accentuated but not  severely so  Hickman catheter ok  Lungs: clear but decreased throughout no wheezing Abdomen: obese. soft, nontender, nondistended. No hepatosplenomegaly. No bruits or masses. hypoactivends.  Extremities: no cyanosis, clubbing, macular-papular erythematous rash on LEs, no edema  Neuro: alert & oriented x 3, cranial nerves grossly intact. moves all 4 extremities w/o difficulty. Affect pleasant.   Labs: Basic Metabolic Panel:  Recent Labs Lab 06/03/14 1750 06/04/14 0009 06/04/14 0420  NA 141  --  146  K 2.5*  --  3.0*  CL 91*  --  97  CO2 32  --  36*  GLUCOSE 216*  --  99  BUN 12  --  11  CREATININE 1.00  --  1.13*  CALCIUM 10.1  --  9.2  MG  --  1.7  --   PHOS  --   --  4.2    Liver Function Tests:  Recent Labs Lab 06/03/14 1750 06/04/14 0420  AST 22 26  ALT 11 9  ALKPHOS 123* 98  BILITOT 0.6 0.5  PROT 8.0 6.5  ALBUMIN 4.0 3.3*    Recent Labs Lab 06/03/14 1750  LIPASE 9*   No results found for this basename: AMMONIA,  in the last 168 hours  CBC:  Recent Labs Lab 06/03/14 1750 06/04/14 0420 06/04/14 0940  WBC 21.3* 21.6* 22.6*  NEUTROABS 19.7* 19.2*  --   HGB 12.0 10.8* 10.6*  HCT 35.7* 33.4* 32.7*  MCV 85.6 87.4 86.7  PLT 444* 400 359    Cardiac Enzymes:  Recent Labs Lab 06/03/14 1750 06/03/14 2311 06/04/14 0420  TROPONINI 0.33* 0.42* 3.55*    BNP: BNP (last 3 results)  Recent Labs  02/23/14 1006  PROBNP 803.2*    CBG:  Recent Labs Lab 06/04/14 0750 06/04/14 1211  GLUCAP 112* 111*    Coagulation Studies:  Recent Labs  06/03/14 1750  LABPROT 13.5  INR 1.03    Other results: EKG: as per HPI  Imaging: Ct Abdomen Pelvis W Contrast  06/03/2014   CLINICAL DATA:  Vomiting.  EXAM: CT ABDOMEN AND PELVIS WITH CONTRAST  TECHNIQUE: Multidetector CT imaging of the abdomen and pelvis was performed using the standard protocol following bolus administration of intravenous contrast.  CONTRAST:  138mL OMNIPAQUE IOHEXOL 300 MG/ML   SOLN  COMPARISON:  CT of the abdomen and pelvis 03/02/2013.  FINDINGS: Lung Bases: Small chronic right-sided pleural effusion with chronic pleural calcifications. Extensive scarring throughout the visualize lung bases bilaterally, similar to the prior examination. Trace chronic left pleural effusion. Central venous catheter tip in the right atrium. Extensive atherosclerosis, including left anterior descending and right coronary artery disease.  Abdomen/Pelvis: Nasogastric tube extends into the mid stomach. Several sub cm low-attenuation lesions in the liver are too small to characterize, but are unchanged compared to the prior examinations in favored to represent benign lesions such as tiny cysts. Previously noted microcystic mass in the head of the pancreas is difficult to discretely visualize on today's examination (arterial phase images were not obtained), but appears slightly larger than the prior study from 03/02/2013, currently measuring approximately 2.3 x 4.2 cm. The appearance of the gallbladder, spleen and bilateral adrenal glands is unremarkable. Interval enlargement  of a 2.7 cm lesion in the upper pole of the right kidney which is generally low-attenuation, however, there is what appears to be an enhancing mural nodule along the posterior aspect of this lesion (nodule measures approximately 10 mm), concerning for a cystic renal cell neoplasm. In addition, there is soft tissue infiltration in the region of the collecting systems and renal pelvises bilaterally, which has progressively worsened over consecutive prior examinations. This is most evident on the delayed images where there is severe attenuation of the urinary contrast in the collecting systems of the kidneys bilaterally.  Trace volume of ascites. No pneumoperitoneum. No pathologic distention of small bowel. No lymphadenopathy noted in the abdomen or pelvis. Extensive atherosclerosis throughout the abdominal and pelvic vasculature, without  evidence of aneurysm. Status post hysterectomy. Ovaries are unremarkable in appearance. Urinary bladder is normal in appearance. Specifically, no urothelial lesion is identified in the urinary bladder.  Musculoskeletal: There are no aggressive appearing lytic or blastic lesions noted in the visualized portions of the skeleton.  IMPRESSION: 1. No acute findings to account for the patient's history of vomiting. 2. 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 Continued attention on future follow-up imaging in 1 year is recommended. 3. Grossly abnormal appearance of the soft tissue in the collecting systems of the kidneys and renal pelves bilaterally, which has progressively worsened over prior examinations, concerning for potential bilateral upper tract urothelial neoplasm. This may alternatively be secondary to chronic inflammation, but nonemergent urologic consultation for further workup and evaluation is strongly recommended in the near future. 4. Additionally, there appears to be an enlarging cystic renal cell neoplasm with an enhancing mural nodule in the upper pole of the right kidney that measures approximately 2.7 cm. As stated above, nonemergent urologic consultation is recommended. 5. Trace volume of ascites. 6. Small right and trace left chronic pleural effusions again noted. 7. Atherosclerosis, including at least 2 vessel coronary artery disease. Please note that although the presence of coronary artery calcium documents the presence of coronary artery disease, the severity of this disease and any potential stenosis cannot be assessed on this non-gated CT examination. Assessment for potential risk factor modification, dietary therapy or pharmacologic therapy may be warranted, if clinically indicated. 8. Additional incidental findings, as above.   Electronically Signed   By: Vinnie Langton M.D.   On: 06/03/2014 23:06   Dg Abd Acute W/chest  06/03/2014   CLINICAL DATA:  Abdominal pain and vomiting.  EXAM: ACUTE ABDOMEN SERIES (ABDOMEN 2 VIEW & CHEST 1 VIEW)  COMPARISON:  02/23/2014 chest x-ray.  FINDINGS: The right IJ catheter is stable. There is an NG tube tip is in the body region of the stomach. The heart is mildly enlarged but stable. There is tortuosity and calcification of the thoracic aorta. No acute pulmonary findings.  The abdominal bowel gas pattern demonstrates a paucity of bowel gas. This can occasionally be seen with a small bowel obstruction an fluid-filled loops of small bowel. I do not see any obvious stool or air in the colon. The soft tissue shadows are maintained. No worrisome calcifications. No definite free air. The bony structures are intact.  IMPRESSION: Paucity of abdominal bowel gas. This can occasionally be seen with small bowel obstructions with fluid-filled loops of small bowel.  No free air.  No significant pulmonary findings.   Electronically Signed   By: Veneta Penton.D.  On: 06/03/2014 19:31      Medications:     Current Medications: . antiseptic oral rinse  7 mL Mouth Rinse q12n4p  . chlorhexidine  15 mL Mouth Rinse BID  . insulin aspart  0-9 Units Subcutaneous TID WC  . insulin glargine  12 Units Subcutaneous q morning - 10a  . levothyroxine  12.5 mcg Intravenous QAC breakfast  . metoprolol succinate  100 mg Oral Daily  . [START ON 06/07/2014] pantoprazole (PROTONIX) IV  40 mg Intravenous Q12H     Infusions: . epoprostenol    . pantoprozole (PROTONIX) infusion 8 mg/hr (06/04/14 1309)      Assessment:   1. Hematemesis 2. NSTEMI 3. Right renal mass 4. Severe PAH on Flolan 5. CAD s/p CABG 6. DM2 7. Leukocytosis  Plan/Discussion:    Nausea and vomiting with hematemesis appears improved. ? Component of diabetic gastroparesis. GI has seen and is managing conservatively. Etiology of leukocytosis unclear. No obvious sites  of infection. UA is clean. Will order CXR and [place]. Further management per hospitalist team.   Cardiac markers elevated concerning for NSTEMI but denies CP and ECG without ischemic changes despite profound tachycardia. Will repeat troponin. Will use low-dose ASA, statin and b-blocker but not candidate for heparin with UGIB. Will not repet cath unless she develops clear angina or has markedly rising troponins.   PAH is stable. Continue Flolan.   Appreciate Urology evaluation of renal mass - suspect RCC.   Length of Stay: 1  Glori Bickers MD 06/04/2014, 4:51 PM  Advanced Heart Failure Team Pager [phone] (M-F; 7a - 4p)  Please contact [facility] for night-coverage after hours (4p -7a ) and weekends on [email or web address]

## 2014-06-04 NOTE — Plan of Care (Signed)
Problem: Consults Goal: Nutrition Consult-if indicated Currently NPO until GI consult  Problem: Phase I Progression Outcomes Goal: Pain controlled with appropriate interventions Denies pain

## 2014-06-04 NOTE — Care Management Note (Addendum)
  Page 2 of 2   06/09/2014     9:00:25 PM CARE MANAGEMENT NOTE 06/09/2014  Patient:  Meagan [name], Meagan [name]   Account Number:  [id number]  Date Initiated:  06/04/2014  Documentation initiated by:  Elissa Hefty  Subjective/Objective Assessment:   adm w gi bleed, elev troponins     Action/Plan:   lives w husband, pcp dr Purcell Nails fusco   Anticipated DC Date:  06/09/2014   Anticipated DC Plan:  [place]  CM consult      [place]   Choice offered to / List presented to:  C-1 Patient        [place] arranged  HH-1 RN  [place].   Status of service:  Completed, signed off Medicare Important Message given?  YES (If response is "NO", the following Medicare IM given date fields will be blank) Date Medicare IM given:  06/07/2014 Medicare IM given by:  [facility] Date Additional Medicare IM given:   Additional Medicare IM given by:    Discharge Disposition:  [place]  Per UR Regulation:  Reviewed for med. necessity/level of care/duration of stay  If discussed at [place] of Stay Meetings, dates discussed:   06/08/2014  06/10/2014    Comments:  Mariann Laster RN, BSN, MSHL, CCM  Nurse - Case Manager,  ([facility])  [phone]  06/09/2014 Social:  From home with husband/PCG HIDA scan 06/07/2014 CDiff + 06/08/14 Home IV Med:  Sodium (FLOLAN IV)  Inject into the vein. 1.5 mg. Inject into the vein continuously. Infuse mg/kg/min via cont IV pump administered by husband/PCG via [place].  Husband orders refill on medication approximately 10-14 days before running out of med from [place]. Home Oxygen and portable tank in room for d/c. HFC: yes THN: No PT RECS:  HH PT Disposition Plan:  Home with HHS:  RN, PT

## 2014-06-04 NOTE — Consult Note (Signed)
Unassigned Consult  Reason for Consult: Coffee-ground emesis and melena Referring Physician: [facility]  Meagan [name] HPI: This is a 68 year old female with a PMH of CAD, CRI, pulmonary HTN on Flolan, DM, and diastolic CHF transferred from [facility] for coffee ground emesis and melena.  Shortly after eating dinner last evening she started to have issues with nausea and vomiting.  The emesis was coffee-ground and then it was followed by melena.  No recent history of any abdominal complaints, but in 2011 she had an EGD with findings of a Schatzki's ring.  She currently takes Plavix and she was also using ibuprofen.  Her HGB has not changed significantly.  In fact, the current value is at her baseline.  No complaints of any abdominal pain or hematochezia.    Past Medical History  Diagnosis Date  . Fibromyalgia   . Hypercholesterolemia   . Hypertension     pulmonary  pap 110/31 (mean 62)by R[facility] 9/10 negative PE by chest CT 2010  . Pulmonary edema     Failed Revatio   . ARF (acute renal failure)     poor candidate for ACE -I or ARB  . Restrictive lung disease     obesity hypoventilation syndrome  . Renal insufficiency   . CAD (coronary artery disease)     multivessel DES x 2 RCA 2007  . Chronic diastolic heart failure   . MRSA (methicillin resistant staphylococcus aureus) pneumonia   . Pulmonary hypertension   . Chronic anemia     anemia of chronic disease based on anemia panel 2011  . Pancreatic mass 08/01/2012  . PONV (postoperative nausea and vomiting)   . Heart murmur     "when I was small"  . CHF (congestive heart failure)   . Anginal pain   . Pneumonia X 3  . Type II diabetes mellitus   . Hypothyroidism   . H/O hiatal hernia   . GERD (gastroesophageal reflux disease)   . Anxiety   . On home oxygen therapy     "6L all the time" (02/24/2014    Past Surgical History  Procedure Laterality Date  . Coronary artery bypass graft  1997    LIMA to LAD,SVG to OM   . Esophagogastroduodenoscopy  06/28/10    schatzki ring otherwise normal;small hiatal hernia  . Coronary angioplasty  1993  . Coronary angioplasty with stent placement  ~ 1996; 2008    "think I have 3 stents"  . Abdominal hysterectomy  ~ 1986  . Dilation and curettage of uterus  ~ 1984  . Tubal ligation      Family History  Problem Relation Age of Onset  . Cancer Mother     oral  . Heart attack Father   . COPD Brother   . Heart disease Brother     Social History:  reports that she has never smoked. She has never used smokeless tobacco. She reports that she does not drink alcohol or use illicit drugs.  Allergies:  Allergies  Allergen Reactions  . Aspirin     REACTION: Intolerance  . Codeine Other (See Comments)    Messes with digestive system  . Niacin Other (See Comments)    Pins sticking into skin  . Nsaids Other (See Comments)    REACTION: Aggravates Digestive System   . Phenergan Fortis [Promethazine] Other (See Comments)    Family and pt states becomes very confused and wild.   . Sulfonamide Derivatives Other (See Comments)  Messes with digestive system    Medications:  Scheduled: . antiseptic oral rinse  7 mL Mouth Rinse q12n4p  . chlorhexidine  15 mL Mouth Rinse BID  . insulin aspart  0-9 Units Subcutaneous TID WC  . insulin glargine  12 Units Subcutaneous q morning - 10a  . levothyroxine  12.5 mcg Intravenous QAC breakfast  . metoprolol succinate  100 mg Oral Daily  . [START ON 06/07/2014] pantoprazole (PROTONIX) IV  40 mg Intravenous Q12H  . potassium chloride  10 mEq Intravenous Q1 Hr x 4   Continuous: . epoprostenol    . pantoprozole (PROTONIX) infusion 8 mg/hr (06/04/14 0219)    Results for orders placed during the hospital encounter of 06/03/14 (from the past 24 hour(s))  CBC WITH DIFFERENTIAL     Status: Abnormal   Collection Time    06/03/14  5:50 PM      Result Value Ref Range   WBC 21.3 (*) 4.0 - 10.5 K/uL   RBC 4.17  3.87 - 5.11 MIL/uL    Hemoglobin 12.0  12.0 - 15.0 g/dL   HCT 35.7 (*) 36.0 - 46.0 %   MCV 85.6  78.0 - 100.0 fL   MCH 28.8  26.0 - 34.0 pg   MCHC 33.6  30.0 - 36.0 g/dL   RDW 13.3  11.5 - 15.5 %   Platelets 444 (*) 150 - 400 K/uL   Neutrophils Relative % 93 (*) 43 - 77 %   Neutro Abs 19.7 (*) 1.7 - 7.7 K/uL   Lymphocytes Relative 3 (*) 12 - 46 %   Lymphs Abs 0.7  0.7 - 4.0 K/uL   Monocytes Relative 4  3 - 12 %   Monocytes Absolute 0.9  0.1 - 1.0 K/uL   Eosinophils Relative 0  0 - 5 %   Eosinophils Absolute 0.0  0.0 - 0.7 K/uL   Basophils Relative 0  0 - 1 %   Basophils Absolute 0.0  0.0 - 0.1 K/uL  COMPREHENSIVE METABOLIC PANEL     Status: Abnormal   Collection Time    06/03/14  5:50 PM      Result Value Ref Range   Sodium 141  137 - 147 mEq/L   Potassium 2.5 (*) 3.7 - 5.3 mEq/L   Chloride 91 (*) 96 - 112 mEq/L   CO2 32  19 - 32 mEq/L   Glucose, Bld 216 (*) 70 - 99 mg/dL   BUN 12  6 - 23 mg/dL   Creatinine, Ser 1.00  0.50 - 1.10 mg/dL   Calcium 10.1  8.4 - 10.5 mg/dL   Total Protein 8.0  6.0 - 8.3 g/dL   Albumin 4.0  3.5 - 5.2 g/dL   AST 22  0 - 37 U/L   ALT 11  0 - 35 U/L   Alkaline Phosphatase 123 (*) 39 - 117 U/L   Total Bilirubin 0.6  0.3 - 1.2 mg/dL   GFR calc non Af Amer 56 (*) >90 mL/min   GFR calc Af Amer 65 (*) >90 mL/min   Anion gap 18 (*) 5 - 15  LIPASE, BLOOD     Status: Abnormal   Collection Time    06/03/14  5:50 PM      Result Value Ref Range   Lipase 9 (*) 11 - 59 U/L  TROPONIN I     Status: Abnormal   Collection Time    06/03/14  5:50 PM      Result Value Ref Range  Troponin I 0.33 (*) <0.30 ng/mL  PROTIME-INR     Status: None   Collection Time    06/03/14  5:50 PM      Result Value Ref Range   Prothrombin Time 13.5  11.6 - 15.2 seconds   INR 1.03  0.00 - 1.49  OCCULT BLOOD GASTRIC / DUODENUM (SPECIMEN CUP)     Status: Abnormal   Collection Time    06/03/14  6:00 PM      Result Value Ref Range   pH, Gastric 2     Occult Blood, Gastric POSITIVE (*) NEGATIVE   I-STAT CG4 LACTIC ACID, ED     Status: Abnormal   Collection Time    06/03/14  6:24 PM      Result Value Ref Range   Lactic Acid, Venous 2.39 (*) 0.5 - 2.2 mmol/L  TYPE AND SCREEN     Status: None   Collection Time    06/03/14  6:48 PM      Result Value Ref Range   ABO/RH(D) O POS     Antibody Screen NEG     Sample Expiration 06/06/2014    URINALYSIS, ROUTINE W REFLEX MICROSCOPIC     Status: Abnormal   Collection Time    06/03/14  7:36 PM      Result Value Ref Range   Color, Urine YELLOW  YELLOW   APPearance CLEAR  CLEAR   Specific Gravity, Urine 1.015  1.005 - 1.030   pH 7.5  5.0 - 8.0   Glucose, UA 100 (*) NEGATIVE mg/dL   Hgb urine dipstick TRACE (*) NEGATIVE   Bilirubin Urine NEGATIVE  NEGATIVE   Ketones, ur TRACE (*) NEGATIVE mg/dL   Protein, ur 30 (*) NEGATIVE mg/dL   Urobilinogen, UA 0.2  0.0 - 1.0 mg/dL   Nitrite NEGATIVE  NEGATIVE   Leukocytes, UA NEGATIVE  NEGATIVE  URINE MICROSCOPIC-ADD ON     Status: Abnormal   Collection Time    06/03/14  7:36 PM      Result Value Ref Range   Squamous Epithelial / LPF MANY (*) RARE   WBC, UA 0-2  <3 WBC/hpf   RBC / HPF 0-2  <3 RBC/hpf   Bacteria, UA FEW (*) RARE  TROPONIN I     Status: Abnormal   Collection Time    06/03/14 11:11 PM      Result Value Ref Range   Troponin I 0.42 (*) <0.30 ng/mL  MAGNESIUM     Status: None   Collection Time    06/04/14 12:09 AM      Result Value Ref Range   Magnesium 1.7  1.5 - 2.5 mg/dL  MRSA PCR SCREENING     Status: None   Collection Time    06/04/14 12:27 AM      Result Value Ref Range   MRSA by PCR NEGATIVE  NEGATIVE  CBC WITH DIFFERENTIAL     Status: Abnormal   Collection Time    06/04/14  4:20 AM      Result Value Ref Range   WBC 21.6 (*) 4.0 - 10.5 K/uL   RBC 3.82 (*) 3.87 - 5.11 MIL/uL   Hemoglobin 10.8 (*) 12.0 - 15.0 g/dL   HCT 33.4 (*) 36.0 - 46.0 %   MCV 87.4  78.0 - 100.0 fL   MCH 28.3  26.0 - 34.0 pg   MCHC 32.3  30.0 - 36.0 g/dL   RDW 13.5  11.5 - 15.5 %    Platelets 400  150 - 400 K/uL   Neutrophils Relative % 89 (*) 43 - 77 %   Neutro Abs 19.2 (*) 1.7 - 7.7 K/uL   Lymphocytes Relative 4 (*) 12 - 46 %   Lymphs Abs 0.8  0.7 - 4.0 K/uL   Monocytes Relative 7  3 - 12 %   Monocytes Absolute 1.6 (*) 0.1 - 1.0 K/uL   Eosinophils Relative 0  0 - 5 %   Eosinophils Absolute 0.0  0.0 - 0.7 K/uL   Basophils Relative 0  0 - 1 %   Basophils Absolute 0.0  0.0 - 0.1 K/uL  COMPREHENSIVE METABOLIC PANEL     Status: Abnormal   Collection Time    06/04/14  4:20 AM      Result Value Ref Range   Sodium 146  137 - 147 mEq/L   Potassium 3.0 (*) 3.7 - 5.3 mEq/L   Chloride 97  96 - 112 mEq/L   CO2 36 (*) 19 - 32 mEq/L   Glucose, Bld 99  70 - 99 mg/dL   BUN 11  6 - 23 mg/dL   Creatinine, Ser 1.13 (*) 0.50 - 1.10 mg/dL   Calcium 9.2  8.4 - 10.5 mg/dL   Total Protein 6.5  6.0 - 8.3 g/dL   Albumin 3.3 (*) 3.5 - 5.2 g/dL   AST 26  0 - 37 U/L   ALT 9  0 - 35 U/L   Alkaline Phosphatase 98  39 - 117 U/L   Total Bilirubin 0.5  0.3 - 1.2 mg/dL   GFR calc non Af Amer 48 (*) >90 mL/min   GFR calc Af Amer 56 (*) >90 mL/min   Anion gap 13  5 - 15  PHOSPHORUS     Status: None   Collection Time    06/04/14  4:20 AM      Result Value Ref Range   Phosphorus 4.2  2.3 - 4.6 mg/dL  TROPONIN I     Status: Abnormal   Collection Time    06/04/14  4:20 AM      Result Value Ref Range   Troponin I 3.55 (*) <0.30 ng/mL     Ct Abdomen Pelvis W Contrast  06/03/2014   CLINICAL DATA:  Vomiting.  EXAM: CT ABDOMEN AND PELVIS WITH CONTRAST  TECHNIQUE: Multidetector CT imaging of the abdomen and pelvis was performed using the standard protocol following bolus administration of intravenous contrast.  CONTRAST:  174mL OMNIPAQUE IOHEXOL 300 MG/ML  SOLN  COMPARISON:  CT of the abdomen and pelvis 03/02/2013.  FINDINGS: Lung Bases: Small chronic right-sided pleural effusion with chronic pleural calcifications. Extensive scarring throughout the visualize lung bases bilaterally, similar to  the prior examination. Trace chronic left pleural effusion. Central venous catheter tip in the right atrium. Extensive atherosclerosis, including left anterior descending and right coronary artery disease.  Abdomen/Pelvis: Nasogastric tube extends into the mid stomach. Several sub cm low-attenuation lesions in the liver are too small to characterize, but are unchanged compared to the prior examinations in favored to represent benign lesions such as tiny cysts. Previously noted microcystic mass in the head of the pancreas is difficult to discretely visualize on today's examination (arterial phase images were not obtained), but appears slightly larger than the prior study from 03/02/2013, currently measuring approximately 2.3 x 4.2 cm. The appearance of the gallbladder, spleen and bilateral adrenal glands is unremarkable. Interval enlargement of a 2.7 cm lesion in the upper pole of the right kidney which is generally low-attenuation,  however, there is what appears to be an enhancing mural nodule along the posterior aspect of this lesion (nodule measures approximately 10 mm), concerning for a cystic renal cell neoplasm. In addition, there is soft tissue infiltration in the region of the collecting systems and renal pelvises bilaterally, which has progressively worsened over consecutive prior examinations. This is most evident on the delayed images where there is severe attenuation of the urinary contrast in the collecting systems of the kidneys bilaterally.  Trace volume of ascites. No pneumoperitoneum. No pathologic distention of small bowel. No lymphadenopathy noted in the abdomen or pelvis. Extensive atherosclerosis throughout the abdominal and pelvic vasculature, without evidence of aneurysm. Status post hysterectomy. Ovaries are unremarkable in appearance. Urinary bladder is normal in appearance. Specifically, no urothelial lesion is identified in the urinary bladder.  Musculoskeletal: There are no aggressive  appearing lytic or blastic lesions noted in the visualized portions of the skeleton.  IMPRESSION: 1. No acute findings to account for the patient's history of vomiting. 2. 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 Continued attention on future follow-up imaging in 1 year is recommended. 3. Grossly abnormal appearance of the soft tissue in the collecting systems of the kidneys and renal pelves bilaterally, which has progressively worsened over prior examinations, concerning for potential bilateral upper tract urothelial neoplasm. This may alternatively be secondary to chronic inflammation, but nonemergent urologic consultation for further workup and evaluation is strongly recommended in the near future. 4. Additionally, there appears to be an enlarging cystic renal cell neoplasm with an enhancing mural nodule in the upper pole of the right kidney that measures approximately 2.7 cm. As stated above, nonemergent urologic consultation is recommended. 5. Trace volume of ascites. 6. Small right and trace left chronic pleural effusions again noted. 7. Atherosclerosis, including at least 2 vessel coronary artery disease. Please note that although the presence of coronary artery calcium documents the presence of coronary artery disease, the severity of this disease and any potential stenosis cannot be assessed on this non-gated CT examination. Assessment for potential risk factor modification, dietary therapy or pharmacologic therapy may be warranted, if clinically indicated. 8. Additional incidental findings, as above.   Electronically Signed   By: Vinnie Langton M.D.   On: 06/03/2014 23:06   Dg Abd Acute W/chest  06/03/2014   CLINICAL DATA:  Abdominal pain and vomiting.  EXAM: ACUTE ABDOMEN SERIES (ABDOMEN 2 VIEW & CHEST 1 VIEW)  COMPARISON:  02/23/2014 chest x-ray.  FINDINGS: The right IJ  catheter is stable. There is an NG tube tip is in the body region of the stomach. The heart is mildly enlarged but stable. There is tortuosity and calcification of the thoracic aorta. No acute pulmonary findings.  The abdominal bowel gas pattern demonstrates a paucity of bowel gas. This can occasionally be seen with a small bowel obstruction an fluid-filled loops of small bowel. I do not see any obvious stool or air in the colon. The soft tissue shadows are maintained. No worrisome calcifications. No definite free air. The bony structures are intact.  IMPRESSION: Paucity of abdominal bowel gas. This can occasionally be seen with small bowel obstructions with fluid-filled loops of small bowel.  No free air.  No significant pulmonary findings.   Electronically Signed   By: Kalman Jewels M.D.   On: 06/03/2014 19:31    ROS:  As stated above in the HPI otherwise negative.  Blood pressure 162/60, pulse 101, temperature 98.5 F (36.9 C), temperature source Oral, resp. rate 25, height 5' (1.524 m), weight 151 lb 14.4 oz (68.9 kg), SpO2 100.00%.    PE: Gen: Sedated from Ativan Lungs: CTA right side.  Unable to assess the left side secondary to her position and shallow respirations. CV: RRR, diminished sounds ABM: Soft, NTND, +BS Ext: No C/C/E  Assessment/Plan: 1) Coffee-ground emesis. 2) NSAID use. 3) Anemia. 4) Elevated troponin.   At the time of my interview the history was obtained from the chart and the patient's husband.  It turns out that she had melena before the coffee ground emesis.  I may have started a few weeks ago, but the husband is not sure.  With her severe medical comorbidities, current HGB level, and hemodynamic stability, I think it will be prudent to avoid any endoscopic intervention.  High dose PPIs and avoid NSAIDs should help to resolve the situation.  If her clinical status changes, i.e., hematemesis and/or hematochezia, an emergent EGD will be considered.  Plan: 1) Continue  with pantoprazole Q12 hours IV. 2) Monitor HGB and transfuse if necessary.  Mysty Kielty D 06/04/2014, 8:20 AM

## 2014-06-04 NOTE — Progress Notes (Signed)
Patient ID: Meagan [name], female   DOB: 1946/08/26, 68 y.o.   MRN: [id number]  Patient troponin elevated to 0.43.  Called and discussed pt with on call cardiology, who recommended transport to [facility] at either MICU or Step-down.  Patient's condition currently stable - discussed pt with Dr Hal Hope also with [facility], who accepted with patient.  Risks vs benefits of transfer discussed with patient.  Loma Boston, DO 1:39 AM 06/04/2014

## 2014-06-04 NOTE — Consult Note (Signed)
Reason for Consult: Rt Parenchymal renal mass, Bilateral renal pelvis thickening  Referring Physician: Loma Boston, DO  Meagan [name] is an 68 y.o. female.   HPI:   1 - Right Parenchymal Renal Mass - Incidental 2.7cm enhancing cystic mass upper pole right kidney by CT 06/03/14 on eval GI bleed. Mass present since at least 2012 (2cm), and seen 2014 (2.5cm).   2 - Bilateral Renal Pelvis Thickening - Incidental bilateral renal pelvis thickening on CT as per above. Cr 1.1 and prompt contrast excretion w/o hydro on same study.  3 - Chronic Cystitis  - h/o recurrent cystitis about once per year. Postmenopausal not on estrogen. No recent febrile / pyelo episodes. UA this admission without overt infections markers. Denies recent dysuria. She does have leukocytosis.  PMH sig for end-stage lung disease (6L O2 all times + Flolan pump, does not leave the house, cannot ambulate more than a few steps due to SOB), CAD/CABG/Stent, heart failure, hysterectomy.  Today Meagan [name] is seen in consultation for above. She is presently hospitalized for upper GI Bleed and active NSTEMI.   Past Medical History  Diagnosis Date  . Fibromyalgia   . Hypercholesterolemia   . Hypertension     pulmonary  pap 110/31 (mean 62)by [facility] 9/10 negative PE by chest CT 2010  . Pulmonary edema     Failed Revatio   . ARF (acute renal failure)     poor candidate for ACE -I or ARB  . Restrictive lung disease     obesity hypoventilation syndrome  . Renal insufficiency   . CAD (coronary artery disease)     multivessel DES x 2 RCA 2007  . Chronic diastolic heart failure   . MRSA (methicillin resistant staphylococcus aureus) pneumonia   . Pulmonary hypertension   . Chronic anemia     anemia of chronic disease based on anemia panel 2011  . Pancreatic mass 08/01/2012  . PONV (postoperative nausea and vomiting)   . Heart murmur     "when I was small"  . CHF (congestive heart failure)   . Anginal pain   . Pneumonia X 3  . Type  II diabetes mellitus   . Hypothyroidism   . H/O hiatal hernia   . GERD (gastroesophageal reflux disease)   . Anxiety   . On home oxygen therapy     "6L all the time" (02/24/2014    Past Surgical History  Procedure Laterality Date  . Coronary artery bypass graft  1997    LIMA to LAD,SVG to OM  . Esophagogastroduodenoscopy  06/28/10    schatzki ring otherwise normal;small hiatal hernia  . Coronary angioplasty  1993  . Coronary angioplasty with stent placement  ~ 1996; 2008    "think I have 3 stents"  . Abdominal hysterectomy  ~ 1986  . Dilation and curettage of uterus  ~ 1984  . Tubal ligation      Family History  Problem Relation Age of Onset  . Cancer Mother     oral  . Heart attack Father   . COPD Brother   . Heart disease Brother     Social History:  reports that she has never smoked. She has never used smokeless tobacco. She reports that she does not drink alcohol or use illicit drugs.  Allergies:  Allergies  Allergen Reactions  . Aspirin     REACTION: Intolerance  . Codeine Other (See Comments)    Messes with digestive system  . Niacin Other (See Comments)  Pins sticking into skin  . Nsaids Other (See Comments)    REACTION: Aggravates Digestive System   . Phenergan Fortis [Promethazine] Other (See Comments)    Family and pt states becomes very confused and wild.   . Sulfonamide Derivatives Other (See Comments)    Messes with digestive system    Medications: I have reviewed the patient's current medications.  Results for orders placed during the hospital encounter of 06/03/14 (from the past 48 hour(s))  CBC WITH DIFFERENTIAL     Status: Abnormal   Collection Time    06/03/14  5:50 PM      Result Value Ref Range   WBC 21.3 (*) 4.0 - 10.5 K/uL   RBC 4.17  3.87 - 5.11 MIL/uL   Hemoglobin 12.0  12.0 - 15.0 g/dL   HCT 35.7 (*) 36.0 - 46.0 %   MCV 85.6  78.0 - 100.0 fL   MCH 28.8  26.0 - 34.0 pg   MCHC 33.6  30.0 - 36.0 g/dL   RDW 13.3  11.5 - 15.5 %    Platelets 444 (*) 150 - 400 K/uL   Neutrophils Relative % 93 (*) 43 - 77 %   Neutro Abs 19.7 (*) 1.7 - 7.7 K/uL   Lymphocytes Relative 3 (*) 12 - 46 %   Lymphs Abs 0.7  0.7 - 4.0 K/uL   Monocytes Relative 4  3 - 12 %   Monocytes Absolute 0.9  0.1 - 1.0 K/uL   Eosinophils Relative 0  0 - 5 %   Eosinophils Absolute 0.0  0.0 - 0.7 K/uL   Basophils Relative 0  0 - 1 %   Basophils Absolute 0.0  0.0 - 0.1 K/uL  COMPREHENSIVE METABOLIC PANEL     Status: Abnormal   Collection Time    06/03/14  5:50 PM      Result Value Ref Range   Sodium 141  137 - 147 mEq/L   Potassium 2.5 (*) 3.7 - 5.3 mEq/L   Comment: CRITICAL RESULT CALLED TO, READ BACK BY AND VERIFIED WITH:     NORMAN,B ON 06/03/14 AT 1920 BY LOY,C   Chloride 91 (*) 96 - 112 mEq/L   CO2 32  19 - 32 mEq/L   Glucose, Bld 216 (*) 70 - 99 mg/dL   BUN 12  6 - 23 mg/dL   Creatinine, Ser 1.00  0.50 - 1.10 mg/dL   Calcium 10.1  8.4 - 10.5 mg/dL   Total Protein 8.0  6.0 - 8.3 g/dL   Albumin 4.0  3.5 - 5.2 g/dL   AST 22  0 - 37 U/L   ALT 11  0 - 35 U/L   Alkaline Phosphatase 123 (*) 39 - 117 U/L   Total Bilirubin 0.6  0.3 - 1.2 mg/dL   GFR calc non Af Amer 56 (*) >90 mL/min   GFR calc Af Amer 65 (*) >90 mL/min   Comment: (NOTE)     The eGFR has been calculated using the CKD EPI equation.     This calculation has not been validated in all clinical situations.     eGFR's persistently <90 mL/min signify possible Chronic Kidney     Disease.   Anion gap 18 (*) 5 - 15  LIPASE, BLOOD     Status: Abnormal   Collection Time    06/03/14  5:50 PM      Result Value Ref Range   Lipase 9 (*) 11 - 59 U/L  TROPONIN I  Status: Abnormal   Collection Time    06/03/14  5:50 PM      Result Value Ref Range   Troponin I 0.33 (*) <0.30 ng/mL   Comment:            Due to the release kinetics of cTnI,     a negative result within the first hours     of the onset of symptoms does not rule out     myocardial infarction with certainty.     If  myocardial infarction is still suspected,     repeat the test at appropriate intervals.     CRITICAL RESULT CALLED TO, READ BACK BY AND VERIFIED WITH:     NORMAN,B ON 06/03/14 AT 1920 BY LOY,C  PROTIME-INR     Status: None   Collection Time    06/03/14  5:50 PM      Result Value Ref Range   Prothrombin Time 13.5  11.6 - 15.2 seconds   INR 1.03  0.00 - 1.49  OCCULT BLOOD GASTRIC / DUODENUM (SPECIMEN CUP)     Status: Abnormal   Collection Time    06/03/14  6:00 PM      Result Value Ref Range   pH, Gastric 2     Occult Blood, Gastric POSITIVE (*) NEGATIVE  OCCULT BLOOD, POC DEVICE     Status: Abnormal   Collection Time    06/03/14  6:07 PM      Result Value Ref Range   Fecal Occult Bld POSITIVE (*) NEGATIVE  I-STAT CG4 LACTIC ACID, ED     Status: Abnormal   Collection Time    06/03/14  6:24 PM      Result Value Ref Range   Lactic Acid, Venous 2.39 (*) 0.5 - 2.2 mmol/L  TYPE AND SCREEN     Status: None   Collection Time    06/03/14  6:48 PM      Result Value Ref Range   ABO/RH(D) O POS     Antibody Screen NEG     Sample Expiration 06/06/2014    URINALYSIS, ROUTINE W REFLEX MICROSCOPIC     Status: Abnormal   Collection Time    06/03/14  7:36 PM      Result Value Ref Range   Color, Urine YELLOW  YELLOW   APPearance CLEAR  CLEAR   Specific Gravity, Urine 1.015  1.005 - 1.030   pH 7.5  5.0 - 8.0   Glucose, UA 100 (*) NEGATIVE mg/dL   Hgb urine dipstick TRACE (*) NEGATIVE   Bilirubin Urine NEGATIVE  NEGATIVE   Ketones, ur TRACE (*) NEGATIVE mg/dL   Protein, ur 30 (*) NEGATIVE mg/dL   Urobilinogen, UA 0.2  0.0 - 1.0 mg/dL   Nitrite NEGATIVE  NEGATIVE   Leukocytes, UA NEGATIVE  NEGATIVE  URINE MICROSCOPIC-ADD ON     Status: Abnormal   Collection Time    06/03/14  7:36 PM      Result Value Ref Range   Squamous Epithelial / LPF MANY (*) RARE   WBC, UA 0-2  <3 WBC/hpf   RBC / HPF 0-2  <3 RBC/hpf   Bacteria, UA FEW (*) RARE  CULTURE, BLOOD (ROUTINE X 2)     Status: None    Collection Time    06/03/14  8:17 PM      Result Value Ref Range   Specimen Description BLOOD LEFT WRIST     Special Requests BOTTLES DRAWN AEROBIC ONLY 6CC  Culture NO GROWTH 1 DAY     Report Status PENDING    CULTURE, BLOOD (ROUTINE X 2)     Status: None   Collection Time    06/03/14  8:23 PM      Result Value Ref Range   Specimen Description BLOOD LEFT ANTECUBITAL     Special Requests BOTTLES DRAWN AEROBIC ONLY 6CC     Culture NO GROWTH 1 DAY     Report Status PENDING    TROPONIN I     Status: Abnormal   Collection Time    06/03/14 11:11 PM      Result Value Ref Range   Troponin I 0.42 (*) <0.30 ng/mL   Comment:            Due to the release kinetics of cTnI,     a negative result within the first hours     of the onset of symptoms does not rule out     myocardial infarction with certainty.     If myocardial infarction is still suspected,     repeat the test at appropriate intervals.     CRITICAL VALUE NOTED.  VALUE IS CONSISTENT WITH PREVIOUSLY REPORTED AND CALLED VALUE.  MAGNESIUM     Status: None   Collection Time    06/04/14 12:09 AM      Result Value Ref Range   Magnesium 1.7  1.5 - 2.5 mg/dL  MRSA PCR SCREENING     Status: None   Collection Time    06/04/14 12:27 AM      Result Value Ref Range   MRSA by PCR NEGATIVE  NEGATIVE   Comment:            The GeneXpert MRSA Assay (FDA     approved for NASAL specimens     only), is one component of a     comprehensive MRSA colonization     surveillance program. It is not     intended to diagnose MRSA     infection nor to guide or     monitor treatment for     MRSA infections.  CBC WITH DIFFERENTIAL     Status: Abnormal   Collection Time    06/04/14  4:20 AM      Result Value Ref Range   WBC 21.6 (*) 4.0 - 10.5 K/uL   RBC 3.82 (*) 3.87 - 5.11 MIL/uL   Hemoglobin 10.8 (*) 12.0 - 15.0 g/dL   HCT 33.4 (*) 36.0 - 46.0 %   MCV 87.4  78.0 - 100.0 fL   MCH 28.3  26.0 - 34.0 pg   MCHC 32.3  30.0 - 36.0 g/dL   RDW  13.5  11.5 - 15.5 %   Platelets 400  150 - 400 K/uL   Neutrophils Relative % 89 (*) 43 - 77 %   Neutro Abs 19.2 (*) 1.7 - 7.7 K/uL   Lymphocytes Relative 4 (*) 12 - 46 %   Lymphs Abs 0.8  0.7 - 4.0 K/uL   Monocytes Relative 7  3 - 12 %   Monocytes Absolute 1.6 (*) 0.1 - 1.0 K/uL   Eosinophils Relative 0  0 - 5 %   Eosinophils Absolute 0.0  0.0 - 0.7 K/uL   Basophils Relative 0  0 - 1 %   Basophils Absolute 0.0  0.0 - 0.1 K/uL  COMPREHENSIVE METABOLIC PANEL     Status: Abnormal   Collection Time    06/04/14  4:20 AM  Result Value Ref Range   Sodium 146  137 - 147 mEq/L   Potassium 3.0 (*) 3.7 - 5.3 mEq/L   Chloride 97  96 - 112 mEq/L   CO2 36 (*) 19 - 32 mEq/L   Glucose, Bld 99  70 - 99 mg/dL   BUN 11  6 - 23 mg/dL   Creatinine, Ser 1.13 (*) 0.50 - 1.10 mg/dL   Calcium 9.2  8.4 - 10.5 mg/dL   Total Protein 6.5  6.0 - 8.3 g/dL   Albumin 3.3 (*) 3.5 - 5.2 g/dL   AST 26  0 - 37 U/L   ALT 9  0 - 35 U/L   Alkaline Phosphatase 98  39 - 117 U/L   Total Bilirubin 0.5  0.3 - 1.2 mg/dL   GFR calc non Af Amer 48 (*) >90 mL/min   GFR calc Af Amer 56 (*) >90 mL/min   Comment: (NOTE)     The eGFR has been calculated using the CKD EPI equation.     This calculation has not been validated in all clinical situations.     eGFR's persistently <90 mL/min signify possible Chronic Kidney     Disease.   Anion gap 13  5 - 15  PHOSPHORUS     Status: None   Collection Time    06/04/14  4:20 AM      Result Value Ref Range   Phosphorus 4.2  2.3 - 4.6 mg/dL  TROPONIN I     Status: Abnormal   Collection Time    06/04/14  4:20 AM      Result Value Ref Range   Troponin I 3.55 (*) <0.30 ng/mL   Comment: CRITICAL RESULT CALLED TO, READ BACK BY AND VERIFIED WITH:     STONE,A AT 5:30AM ON 06/04/14 BY FESTERMAN,C                Due to the release kinetics of cTnI,     a negative result within the first hours     of the onset of symptoms does not rule out     myocardial infarction with certainty.      If myocardial infarction is still suspected,     repeat the test at appropriate intervals.  GLUCOSE, CAPILLARY     Status: Abnormal   Collection Time    06/04/14  7:50 AM      Result Value Ref Range   Glucose-Capillary 112 (*) 70 - 99 mg/dL  CBC     Status: Abnormal   Collection Time    06/04/14  9:40 AM      Result Value Ref Range   WBC 22.6 (*) 4.0 - 10.5 K/uL   RBC 3.77 (*) 3.87 - 5.11 MIL/uL   Hemoglobin 10.6 (*) 12.0 - 15.0 g/dL   HCT 32.7 (*) 36.0 - 46.0 %   MCV 86.7  78.0 - 100.0 fL   MCH 28.1  26.0 - 34.0 pg   MCHC 32.4  30.0 - 36.0 g/dL   RDW 13.5  11.5 - 15.5 %   Platelets 359  150 - 400 K/uL  OCCULT BLOOD X 1 CARD TO LAB, STOOL     Status: Abnormal   Collection Time    06/04/14 11:30 AM      Result Value Ref Range   Fecal Occult Bld POSITIVE (*) NEGATIVE  GLUCOSE, CAPILLARY     Status: Abnormal   Collection Time    06/04/14 12:11 PM  Result Value Ref Range   Glucose-Capillary 111 (*) 70 - 99 mg/dL  CBC     Status: Abnormal   Collection Time    06/04/14  5:05 PM      Result Value Ref Range   WBC 26.2 (*) 4.0 - 10.5 K/uL   RBC 4.43  3.87 - 5.11 MIL/uL   Hemoglobin 12.5  12.0 - 15.0 g/dL   HCT 38.7  36.0 - 46.0 %   MCV 87.4  78.0 - 100.0 fL   MCH 28.2  26.0 - 34.0 pg   MCHC 32.3  30.0 - 36.0 g/dL   RDW 13.7  11.5 - 15.5 %   Platelets 386  150 - 400 K/uL    Ct Abdomen Pelvis W Contrast  06/03/2014   CLINICAL DATA:  Vomiting.  EXAM: CT ABDOMEN AND PELVIS WITH CONTRAST  TECHNIQUE: Multidetector CT imaging of the abdomen and pelvis was performed using the standard protocol following bolus administration of intravenous contrast.  CONTRAST:  159m OMNIPAQUE IOHEXOL 300 MG/ML  SOLN  COMPARISON:  CT of the abdomen and pelvis 03/02/2013.  FINDINGS: Lung Bases: Small chronic right-sided pleural effusion with chronic pleural calcifications. Extensive scarring throughout the visualize lung bases bilaterally, similar to the prior examination. Trace chronic left  pleural effusion. Central venous catheter tip in the right atrium. Extensive atherosclerosis, including left anterior descending and right coronary artery disease.  Abdomen/Pelvis: Nasogastric tube extends into the mid stomach. Several sub cm low-attenuation lesions in the liver are too small to characterize, but are unchanged compared to the prior examinations in favored to represent benign lesions such as tiny cysts. Previously noted microcystic mass in the head of the pancreas is difficult to discretely visualize on today's examination (arterial phase images were not obtained), but appears slightly larger than the prior study from 03/02/2013, currently measuring approximately 2.3 x 4.2 cm. The appearance of the gallbladder, spleen and bilateral adrenal glands is unremarkable. Interval enlargement of a 2.7 cm lesion in the upper pole of the right kidney which is generally low-attenuation, however, there is what appears to be an enhancing mural nodule along the posterior aspect of this lesion (nodule measures approximately 10 mm), concerning for a cystic renal cell neoplasm. In addition, there is soft tissue infiltration in the region of the collecting systems and renal pelvises bilaterally, which has progressively worsened over consecutive prior examinations. This is most evident on the delayed images where there is severe attenuation of the urinary contrast in the collecting systems of the kidneys bilaterally.  Trace volume of ascites. No pneumoperitoneum. No pathologic distention of small bowel. No lymphadenopathy noted in the abdomen or pelvis. Extensive atherosclerosis throughout the abdominal and pelvic vasculature, without evidence of aneurysm. Status post hysterectomy. Ovaries are unremarkable in appearance. Urinary bladder is normal in appearance. Specifically, no urothelial lesion is identified in the urinary bladder.  Musculoskeletal: There are no aggressive appearing lytic or blastic lesions noted in  the visualized portions of the skeleton.  IMPRESSION: 1. No acute findings to account for the patient's history of vomiting. 2. 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 Continued attention on future follow-up imaging in 1 year is recommended. 3. Grossly abnormal appearance of the soft tissue in the collecting systems of the kidneys and renal pelves bilaterally, which has progressively worsened over prior examinations, concerning for potential bilateral upper tract urothelial neoplasm. This may alternatively be secondary to  chronic inflammation, but nonemergent urologic consultation for further workup and evaluation is strongly recommended in the near future. 4. Additionally, there appears to be an enlarging cystic renal cell neoplasm with an enhancing mural nodule in the upper pole of the right kidney that measures approximately 2.7 cm. As stated above, nonemergent urologic consultation is recommended. 5. Trace volume of ascites. 6. Small right and trace left chronic pleural effusions again noted. 7. Atherosclerosis, including at least 2 vessel coronary artery disease. Please note that although the presence of coronary artery calcium documents the presence of coronary artery disease, the severity of this disease and any potential stenosis cannot be assessed on this non-gated CT examination. Assessment for potential risk factor modification, dietary therapy or pharmacologic therapy may be warranted, if clinically indicated. 8. Additional incidental findings, as above.   Electronically Signed   By: Vinnie Langton M.D.   On: 06/03/2014 23:06   Dg Abd Acute W/chest  06/03/2014   CLINICAL DATA:  Abdominal pain and vomiting.  EXAM: ACUTE ABDOMEN SERIES (ABDOMEN 2 VIEW & CHEST 1 VIEW)  COMPARISON:  02/23/2014 chest x-ray.  FINDINGS: The right IJ catheter is stable. There is an NG tube tip is  in the body region of the stomach. The heart is mildly enlarged but stable. There is tortuosity and calcification of the thoracic aorta. No acute pulmonary findings.  The abdominal bowel gas pattern demonstrates a paucity of bowel gas. This can occasionally be seen with a small bowel obstruction an fluid-filled loops of small bowel. I do not see any obvious stool or air in the colon. The soft tissue shadows are maintained. No worrisome calcifications. No definite free air. The bony structures are intact.  IMPRESSION: Paucity of abdominal bowel gas. This can occasionally be seen with small bowel obstructions with fluid-filled loops of small bowel.  No free air.  No significant pulmonary findings.   Electronically Signed   By: Kalman Jewels M.D.   On: 06/03/2014 19:31    Review of Systems  Constitutional: Positive for malaise/fatigue. Negative for fever and chills.  HENT: Negative.   Eyes: Negative.   Respiratory: Positive for shortness of breath.   Cardiovascular: Negative.  Negative for chest pain.  Gastrointestinal: Positive for nausea and vomiting.  Genitourinary: Negative.  Negative for dysuria, urgency, frequency, hematuria and flank pain.  Musculoskeletal: Negative.   Skin: Negative.   Neurological: Positive for weakness.  Endo/Heme/Allergies: Negative.   Psychiatric/Behavioral: Negative.    Blood pressure 113/33, pulse 101, temperature 98.8 F (37.1 C), temperature source Oral, resp. rate 23, height 5' (1.524 m), weight 68.9 kg (151 lb 14.4 oz), SpO2 100.00%. Physical Exam  Constitutional: She is oriented to person, place, and time.  Pleasant, chronically ill-appearing, husband at bedside. Pt eating a popsicle  HENT:  Head: Normocephalic and atraumatic.  Eyes: EOM are normal. Pupils are equal, round, and reactive to light.  Neck: Normal range of motion. Neck supple.  Cardiovascular:  Tachycardia by bedside monitor to HR 97  Respiratory:  Mildly labored breathing on [place] O2  GI:  Soft.  Remote pfannensteil scar  Genitourinary:  No CVAT  Musculoskeletal: Normal range of motion.  Neurological: She is alert and oriented to person, place, and time.  Skin: Skin is warm and dry.  Psychiatric: She has a normal mood and affect. Her behavior is normal. Judgment and thought content normal.    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 Natural history  discussed with patient and husband including management options including surveillance and definitive primary therapy for which she is not a candidate for at present and unlikely to be given severe cardiopulmonary disease. Fortunately many such masses have indolent course.   2 - Bilateral Renal Pelvis Thickening - DDX chronic inflammation, pyelonephritis, urothelial malignancy. Again, she is not a candidate for invasive workup at this point. In contrary to above neoplasm, urothelial neoplasm, if present, can have aggressive course. Will consider outpatient urinary cytology, but not in acute setting given possible cystitis. Fortunately there is no hydronephrosis and GFR remains excellent.  3 - Chronic Cystitis  - Agree with UCX and treatment if exhibits clonal growth.   We will arrange outpatient follow-up pending hospital course, as these issues are non-acute relative to her more urgent cardiopulmonary and GI problems.  Will follow prn while in house, call with questions anytime.    Kyanne Rials 06/04/2014, 5:52 PM

## 2014-06-04 NOTE — Progress Notes (Addendum)
[facility] PROGRESS NOTE  Meagan GWYNNE NIO:[id number] DOB: July 25, 1946 DOA: 06/03/2014 PCP: Glo Herring., MD  Assessment/Plan: 1. Melena/Coffee ground emesis -TX from [facility] overnight -continue PPI gtt and Q12, supportive care with Zofran/reglan -Plavix on hold, takes aleve PRN -GI consult d/w Dr.Hung, DC NG tube if ok with GI -last EGD per Dr.Rourk in 2011 with Hiatal hernia and Schatzkis ring  -CBC q8 -transfuse PRN  2. CAD with severe 3 vessel dz and Elevated Troponin -no chest pain, likely due to demand -Cards consulted overnight, consult pending -resume Toprol, unable to use antiplatelet agents due to 1 -LHC 5/15 with several 3V disease but not easily revascularizable  3. Chronic resp failure -Severe Pulm HTN and Restrictive lung disease -on 6L Home O2 at baseline and Flolan gtt followed by [facility]  4. DM -resume lantus at half dose, SSI  5. Hypokalemia -replace  6. Low grade Fever with leukocytosis  -Source not clear. Patient is hemodynamically stable. Blood and urine cultures have been drawn, monitor clinically for now -could be related to renal issues listed below  7. Abnormal soft tissue in B/L renal collecting system and Renal cystic mass on CT as well -URology eval once acute issues stable -d/w Dr.Bensimhon who recommends Urology consult today for prognostication etc  DVT prophylaxis - SCDs  Code Status: Full Code Family Communication: d/w spouse at bedside Disposition Plan: keep in SDU   Consultants:  Cards  GI  HPI/Subjective: Complains of Nausea and vomiting No chest pain, no increased dyspnea, breathing at baseline  Objective: Filed Vitals:   06/04/14 0730  BP:   Pulse:   Temp: 98.5 F (36.9 C)  Resp:     Intake/Output Summary (Last 24 hours) at 06/04/14 0806 Last data filed at 06/04/14 0700  Gross per 24 hour  Intake 627.08 ml  Output    550 ml  Net  77.08 ml   Filed Weights   06/03/14 1733 06/03/14 2330  Weight: 70.308  kg (155 lb) 68.9 kg (151 lb 14.4 oz)    Exam:   General: AAOx3  Cardiovascular: S1S2/RRR  Respiratory: CTAB  Abdomen: soft, Nt, BS present  Musculoskeletal: no edema c/c   Data Reviewed: Basic Metabolic Panel:  Recent Labs Lab 06/03/14 1750 06/04/14 0009 06/04/14 0420  NA 141  --  146  K 2.5*  --  3.0*  CL 91*  --  97  CO2 32  --  36*  GLUCOSE 216*  --  99  BUN 12  --  11  CREATININE 1.00  --  1.13*  CALCIUM 10.1  --  9.2  MG  --  1.7  --   PHOS  --   --  4.2   Liver Function Tests:  Recent Labs Lab 06/03/14 1750 06/04/14 0420  AST 22 26  ALT 11 9  ALKPHOS 123* 98  BILITOT 0.6 0.5  PROT 8.0 6.5  ALBUMIN 4.0 3.3*    Recent Labs Lab 06/03/14 1750  LIPASE 9*   No results found for this basename: AMMONIA,  in the last 168 hours CBC:  Recent Labs Lab 06/03/14 1750 06/04/14 0420  WBC 21.3* 21.6*  NEUTROABS 19.7* 19.2*  HGB 12.0 10.8*  HCT 35.7* 33.4*  MCV 85.6 87.4  PLT 444* 400   Cardiac Enzymes:  Recent Labs Lab 06/03/14 1750 06/03/14 2311 06/04/14 0420  TROPONINI 0.33* 0.42* 3.55*   BNP (last 3 results)  Recent Labs  02/23/14 1006  PROBNP 803.2*   CBG: No results found for this  basename: GLUCAP,  in the last 168 hours  Recent Results (from the past 240 hour(s))  MRSA PCR SCREENING     Status: None   Collection Time    06/04/14 12:27 AM      Result Value Ref Range Status   MRSA by PCR NEGATIVE  NEGATIVE Final   Comment:            The GeneXpert MRSA Assay (FDA     approved for NASAL specimens     only), is one component of a     comprehensive MRSA colonization     surveillance program. It is not     intended to diagnose MRSA     infection nor to guide or     monitor treatment for     MRSA infections.     Studies: Ct Abdomen Pelvis W Contrast  06/03/2014   CLINICAL DATA:  Vomiting.  EXAM: CT ABDOMEN AND PELVIS WITH CONTRAST  TECHNIQUE: Multidetector CT imaging of the abdomen and pelvis was performed using the  standard protocol following bolus administration of intravenous contrast.  CONTRAST:  161mL OMNIPAQUE IOHEXOL 300 MG/ML  SOLN  COMPARISON:  CT of the abdomen and pelvis 03/02/2013.  FINDINGS: Lung Bases: Small chronic right-sided pleural effusion with chronic pleural calcifications. Extensive scarring throughout the visualize lung bases bilaterally, similar to the prior examination. Trace chronic left pleural effusion. Central venous catheter tip in the right atrium. Extensive atherosclerosis, including left anterior descending and right coronary artery disease.  Abdomen/Pelvis: Nasogastric tube extends into the mid stomach. Several sub cm low-attenuation lesions in the liver are too small to characterize, but are unchanged compared to the prior examinations in favored to represent benign lesions such as tiny cysts. Previously noted microcystic mass in the head of the pancreas is difficult to discretely visualize on today's examination (arterial phase images were not obtained), but appears slightly larger than the prior study from 03/02/2013, currently measuring approximately 2.3 x 4.2 cm. The appearance of the gallbladder, spleen and bilateral adrenal glands is unremarkable. Interval enlargement of a 2.7 cm lesion in the upper pole of the right kidney which is generally low-attenuation, however, there is what appears to be an enhancing mural nodule along the posterior aspect of this lesion (nodule measures approximately 10 mm), concerning for a cystic renal cell neoplasm. In addition, there is soft tissue infiltration in the region of the collecting systems and renal pelvises bilaterally, which has progressively worsened over consecutive prior examinations. This is most evident on the delayed images where there is severe attenuation of the urinary contrast in the collecting systems of the kidneys bilaterally.  Trace volume of ascites. No pneumoperitoneum. No pathologic distention of small bowel. No lymphadenopathy  noted in the abdomen or pelvis. Extensive atherosclerosis throughout the abdominal and pelvic vasculature, without evidence of aneurysm. Status post hysterectomy. Ovaries are unremarkable in appearance. Urinary bladder is normal in appearance. Specifically, no urothelial lesion is identified in the urinary bladder.  Musculoskeletal: There are no aggressive appearing lytic or blastic lesions noted in the visualized portions of the skeleton.  IMPRESSION: 1. No acute findings to account for the patient's history of vomiting. 2. 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 Continued attention on future follow-up imaging in 1 year is recommended. 3. Grossly abnormal appearance of the soft tissue in the collecting systems of the kidneys and renal pelves bilaterally, which has progressively  worsened over prior examinations, concerning for potential bilateral upper tract urothelial neoplasm. This may alternatively be secondary to chronic inflammation, but nonemergent urologic consultation for further workup and evaluation is strongly recommended in the near future. 4. Additionally, there appears to be an enlarging cystic renal cell neoplasm with an enhancing mural nodule in the upper pole of the right kidney that measures approximately 2.7 cm. As stated above, nonemergent urologic consultation is recommended. 5. Trace volume of ascites. 6. Small right and trace left chronic pleural effusions again noted. 7. Atherosclerosis, including at least 2 vessel coronary artery disease. Please note that although the presence of coronary artery calcium documents the presence of coronary artery disease, the severity of this disease and any potential stenosis cannot be assessed on this non-gated CT examination. Assessment for potential risk factor modification, dietary therapy or pharmacologic therapy may be  warranted, if clinically indicated. 8. Additional incidental findings, as above.   Electronically Signed   By: Vinnie Langton M.D.   On: 06/03/2014 23:06   Dg Abd Acute W/chest  06/03/2014   CLINICAL DATA:  Abdominal pain and vomiting.  EXAM: ACUTE ABDOMEN SERIES (ABDOMEN 2 VIEW & CHEST 1 VIEW)  COMPARISON:  02/23/2014 chest x-ray.  FINDINGS: The right IJ catheter is stable. There is an NG tube tip is in the body region of the stomach. The heart is mildly enlarged but stable. There is tortuosity and calcification of the thoracic aorta. No acute pulmonary findings.  The abdominal bowel gas pattern demonstrates a paucity of bowel gas. This can occasionally be seen with a small bowel obstruction an fluid-filled loops of small bowel. I do not see any obvious stool or air in the colon. The soft tissue shadows are maintained. No worrisome calcifications. No definite free air. The bony structures are intact.  IMPRESSION: Paucity of abdominal bowel gas. This can occasionally be seen with small bowel obstructions with fluid-filled loops of small bowel.  No free air.  No significant pulmonary findings.   Electronically Signed   By: Kalman Jewels M.D.   On: 06/03/2014 19:31    Scheduled Meds: . antiseptic oral rinse  7 mL Mouth Rinse q12n4p  . chlorhexidine  15 mL Mouth Rinse BID  . insulin aspart  0-9 Units Subcutaneous TID WC  . insulin glargine  13 Units Subcutaneous q morning - 10a  . levothyroxine  12.5 mcg Intravenous QAC breakfast  . metoprolol succinate  100 mg Oral Daily  . [START ON 06/07/2014] pantoprazole (PROTONIX) IV  40 mg Intravenous Q12H   Continuous Infusions: . epoprostenol    . pantoprozole (PROTONIX) infusion 8 mg/hr (06/04/14 0219)   Antibiotics Given (last 72 hours)   None      Active Problems:   Upper GI bleed   Fever   Elevated troponin   Hypokalemia    Time spent: 69min    Toniette Devera  [facility] Pager [phone]. If 7PM-7AM, please contact  night-coverage at [email or web address], password [facility] 06/04/2014, 8:06 AM  LOS: 1 day

## 2014-06-04 NOTE — Progress Notes (Signed)
Patient left in stable condition, alert and oriented times four via [facility].  The patient had a increased troponin from 0.33 to 0.42 with lactic acid of 2.39 and wbc's 21.3.  Patient had nausea unresolved until Reglan 10 mg given by IV at 0320.  The on-call Hospitalist decided to send her to [place] after discussing it with there cardiologist.  Patient's husband and patient made aware by staff and Dr. Nehemiah Settle as for why she was being transferred to a higher level of care and another facility.  Report was called to [place] and [place] RN on 2900.

## 2014-06-04 NOTE — Progress Notes (Signed)
Comments:   Notified by RN 2nd Troponin elevated at 3.55 from 0.42. Dr Nehemiah Settle w/ [place] [facility] spoke w/ cardiologist after first Troponin returned elevated on admission to [facility] for GIB. Cardiologist recommended transfer to [facility]. Initial EKG w/o acute changes and pt has not had c/o CP since admission. Spoke with Sharrell Ku, PA with cardiology service who had no immediate recommendations given pt is CP free and assures pt will be seen by cardiology service this am. Will continue to monitor closely in SDU.  Jeryl Columbia, NP-C [facility] Pager [phone]

## 2014-06-04 NOTE — Progress Notes (Signed)
CRITICAL VALUE ALERT  Critical value received:  Troponin 3.49  Date of notification:  06/04/14  Time of notification:  2308  Critical value read back:Yes.    Nurse who received alert:  [place] Merrik Puebla, RN  MD notified (1st page):  Dr. Dillard Essex  Time of first page:  2320  MD notified (2nd page): Andy Gauss  Time of second page: 2324  Responding MD:  Andy Gauss  Time MD responded:  2326

## 2014-06-05 DIAGNOSIS — R079 Chest pain, unspecified: Secondary | ICD-10-CM

## 2014-06-05 DIAGNOSIS — R7989 Other specified abnormal findings of blood chemistry: Secondary | ICD-10-CM

## 2014-06-05 DIAGNOSIS — K922 Gastrointestinal hemorrhage, unspecified: Secondary | ICD-10-CM

## 2014-06-05 DIAGNOSIS — I519 Heart disease, unspecified: Secondary | ICD-10-CM

## 2014-06-05 LAB — GLUCOSE, CAPILLARY
GLUCOSE-CAPILLARY: 160 mg/dL — AB (ref 70–99)
GLUCOSE-CAPILLARY: 150 mg/dL — AB (ref 70–99)
Glucose-Capillary: 204 mg/dL — ABNORMAL HIGH (ref 70–99)
Glucose-Capillary: 156 mg/dL — ABNORMAL HIGH (ref 70–99)

## 2014-06-05 LAB — URINE CULTURE
CULTURE: NO GROWTH
Colony Count: NO GROWTH

## 2014-06-05 LAB — BASIC METABOLIC PANEL
ANION GAP: 15 (ref 5–15)
BUN: 13 mg/dL (ref 6–23)
CO2: 27 mEq/L (ref 19–32)
CREATININE: 1.11 mg/dL — AB (ref 0.50–1.10)
Calcium: 8.8 mg/dL (ref 8.4–10.5)
Chloride: 96 mEq/L (ref 96–112)
GFR, EST AFRICAN AMERICAN: 57 mL/min — AB (ref >90)
GFR, EST NON AFRICAN AMERICAN: 49 mL/min — AB (ref >90)
Glucose, Bld: 135 mg/dL — ABNORMAL HIGH (ref 70–99)
POTASSIUM: 3.4 meq/L — AB (ref 3.7–5.3)
Sodium: 138 mEq/L (ref 137–147)

## 2014-06-05 LAB — CBC
HCT: 37.7 % (ref 36.0–46.0)
HCT: 40.1 % (ref 36.0–46.0)
Hemoglobin: 12 g/dL (ref 12.0–15.0)
Hemoglobin: 13.1 g/dL (ref 12.0–15.0)
MCH: 27.6 pg (ref 26.0–34.0)
MCH: 29.1 pg (ref 26.0–34.0)
MCHC: 32.7 g/dL (ref 30.0–36.0)
MCHC: 31.8 g/dL (ref 30.0–36.0)
MCV: 86.7 fL (ref 78.0–100.0)
MCV: 89.1 fL (ref 78.0–100.0)
PLATELETS: 343 10*3/uL (ref 150–400)
PLATELETS: 299 10*3/uL (ref 150–400)
RBC: 4.35 MIL/uL (ref 3.87–5.11)
RBC: 4.5 MIL/uL (ref 3.87–5.11)
RDW: 13.5 % (ref 11.5–15.5)
RDW: 13.7 % (ref 11.5–15.5)
WBC: 17.1 10*3/uL — ABNORMAL HIGH (ref 4.0–10.5)
WBC: 22.5 10*3/uL — ABNORMAL HIGH (ref 4.0–10.5)

## 2014-06-05 MED ORDER — PROCHLORPERAZINE MALEATE 5 MG PO TABS
5.0000 mg | ORAL_TABLET | Freq: Four times a day (QID) | ORAL | Status: DC | PRN
  Administered 2014-06-05 – 2014-06-08 (×4): 5 mg via ORAL
  Filled 2014-06-05 (×6): qty 1

## 2014-06-05 MED ORDER — POTASSIUM CHLORIDE 10 MEQ/100ML IV SOLN: 10.0000 meq | INTRAVENOUS | Status: DC

## 2014-06-05 MED ORDER — SUCRALFATE 1 GM/10ML PO SUSP
1.0000 g | Freq: Four times a day (QID) | ORAL | Status: DC
  Administered 2014-06-05 – 2014-06-09 (×14): 1 g via ORAL
  Filled 2014-06-05 (×20): qty 10

## 2014-06-05 MED ORDER — SODIUM CHLORIDE 0.9 % IV SOLN
INTRAVENOUS | Status: DC
  Administered 2014-06-05: via INTRAVENOUS

## 2014-06-05 MED ORDER — GI COCKTAIL ~~LOC~~
30.0000 mL | Freq: Once | ORAL | Status: AC
  Administered 2014-06-05: 30 mL via ORAL
  Filled 2014-06-05: qty 30

## 2014-06-05 MED ORDER — POTASSIUM CHLORIDE CRYS ER 20 MEQ PO TBCR
40.0000 meq | EXTENDED_RELEASE_TABLET | Freq: Two times a day (BID) | ORAL | Status: AC
  Administered 2014-06-05 (×2): 40 meq via ORAL
  Filled 2014-06-05 (×2): qty 2

## 2014-06-05 NOTE — Progress Notes (Signed)
  Echocardiogram 2D Echocardiogram has been performed.  Meagan [name] 06/05/2014, 4:06 PM

## 2014-06-05 NOTE — Progress Notes (Addendum)
[facility] PROGRESS NOTE  Meagan [name] FYB:[id number] DOB: [birth year] DOA: 06/03/2014 PCP: Glo Herring., MD  Assessment/Plan: 1. Melena/Coffee ground emesis -continue PPI gtt and Q12, supportive care with Zofran/reglan, stop PPI drip tomorrow -Plavix on hold, takes aleve PRN -GI consult per Dr.Hung appreciated, supportive care for now -last EGD per Dr.Rourk in 2011 with Hiatal hernia and Schatzkis ring  -Hb stable -no indication for transfusion  2. NSTEMI / CAD with severe 3 vessel dz  -no chest pain, likely due to demand -Cards following -continue Toprol, statin unable to use antiplatelet agents due to 1 -LHC 5/15 with several 3V disease but not easily revascularizable  3. Chronic resp failure -Severe Pulm HTN and Restrictive lung disease -on 6L Home O2 at baseline and Flolan gtt followed by [facility]  4. DM -continue lantus at half dose, SSI  5. Hypokalemia -replace  6. Leukocytosis  -Source not clear. Patient is hemodynamically stable. FU Blood and urine cultures , monitor clinically for now -could be related to renal issues listed below -afebrile now, monitor off abx  7. Abnormal soft tissue in B/L renal collecting system and Renal cystic mass on CT as well, suspect RCC -URology eval appreciated, outpt FU -not candidate for invasive therapies  DVT prophylaxis - SCDs  Code Status: discussed Poor prognosis with spouse at length, he was tearful, finally agreeable to DNR now Family Communication: d/w spouse at bedside Disposition Plan: keep in SDU today   Consultants:  Cards  GI  HPI/Subjective: Complains of Nausea, no more vomiting, pain all over No chest pain, no increased dyspnea, breathing at baseline  Objective: Filed Vitals:   06/05/14 0805  BP: 155/65  Pulse:   Temp:   Resp: 26    Intake/Output Summary (Last 24 hours) at 06/05/14 0848 Last data filed at 06/05/14 0800  Gross per 24 hour  Intake 1254.18 ml  Output    402 ml  Net  852.18 ml   Filed Weights   06/03/14 1733 06/03/14 2330  Weight: 70.308 kg (155 lb) 68.9 kg (151 lb 14.4 oz)    Exam:   General: AAOx2, sleeping, chronically ill  Cardiovascular: S1S2/RRR  Respiratory: scattered ronchi  Abdomen: soft, Nt, BS present  Musculoskeletal: no edema c/c   Data Reviewed: Basic Metabolic Panel:  Recent Labs Lab 06/03/14 1750 06/04/14 0009 06/04/14 0420 06/05/14 0003  NA 141  --  146 138  K 2.5*  --  3.0* 3.4*  CL 91*  --  97 96  CO2 32  --  36* 27  GLUCOSE 216*  --  99 135*  BUN 12  --  11 13  CREATININE 1.00  --  1.13* 1.11*  CALCIUM 10.1  --  9.2 8.8  MG  --  1.7  --   --   PHOS  --   --  4.2  --    Liver Function Tests:  Recent Labs Lab 06/03/14 1750 06/04/14 0420  AST 22 26  ALT 11 9  ALKPHOS 123* 98  BILITOT 0.6 0.5  PROT 8.0 6.5  ALBUMIN 4.0 3.3*    Recent Labs Lab 06/03/14 1750  LIPASE 9*   No results found for this basename: AMMONIA,  in the last 168 hours CBC:  Recent Labs Lab 06/03/14 1750 06/04/14 0420 06/04/14 0940 06/04/14 1705 06/05/14 0003 06/05/14 0813  WBC 21.3* 21.6* 22.6* 26.2* 22.5* 17.1*  NEUTROABS 19.7* 19.2*  --   --   --   --   HGB 12.0 10.8* 10.6* 12.5  13.1 12.0  HCT 35.7* 33.4* 32.7* 38.7 40.1 37.7  MCV 85.6 87.4 86.7 87.4 89.1 86.7  PLT 444* 400 359 386 343 299   Cardiac Enzymes:  Recent Labs Lab 06/03/14 1750 06/03/14 2311 06/04/14 0420 06/04/14 2210  TROPONINI 0.33* 0.42* 3.55* 3.49*   BNP (last 3 results)  Recent Labs  02/23/14 1006  PROBNP 803.2*   CBG:  Recent Labs Lab 06/04/14 0750 06/04/14 1211 06/04/14 1709 06/04/14 2129  GLUCAP 112* 111* 142* 159*    Recent Results (from the past 240 hour(s))  CULTURE, BLOOD (ROUTINE X 2)     Status: None   Collection Time    06/03/14  8:17 PM      Result Value Ref Range Status   Specimen Description BLOOD LEFT WRIST   Final   Special Requests BOTTLES DRAWN AEROBIC ONLY 6CC   Final   Culture NO GROWTH 2 DAYS    Final   Report Status PENDING   Incomplete  CULTURE, BLOOD (ROUTINE X 2)     Status: None   Collection Time    06/03/14  8:23 PM      Result Value Ref Range Status   Specimen Description BLOOD LEFT ANTECUBITAL   Final   Special Requests BOTTLES DRAWN AEROBIC ONLY 6CC   Final   Culture NO GROWTH 2 DAYS   Final   Report Status PENDING   Incomplete  MRSA PCR SCREENING     Status: None   Collection Time    06/04/14 12:27 AM      Result Value Ref Range Status   MRSA by PCR NEGATIVE  NEGATIVE Final   Comment:            The GeneXpert MRSA Assay (FDA     approved for NASAL specimens     only), is one component of a     comprehensive MRSA colonization     surveillance program. It is not     intended to diagnose MRSA     infection nor to guide or     monitor treatment for     MRSA infections.     Studies: Ct Abdomen Pelvis W Contrast  06/03/2014   CLINICAL DATA:  Vomiting.  EXAM: CT ABDOMEN AND PELVIS WITH CONTRAST  TECHNIQUE: Multidetector CT imaging of the abdomen and pelvis was performed using the standard protocol following bolus administration of intravenous contrast.  CONTRAST:  161mL OMNIPAQUE IOHEXOL 300 MG/ML  SOLN  COMPARISON:  CT of the abdomen and pelvis 03/02/2013.  FINDINGS: Lung Bases: Small chronic right-sided pleural effusion with chronic pleural calcifications. Extensive scarring throughout the visualize lung bases bilaterally, similar to the prior examination. Trace chronic left pleural effusion. Central venous catheter tip in the right atrium. Extensive atherosclerosis, including left anterior descending and right coronary artery disease.  Abdomen/Pelvis: Nasogastric tube extends into the mid stomach. Several sub cm low-attenuation lesions in the liver are too small to characterize, but are unchanged compared to the prior examinations in favored to represent benign lesions such as tiny cysts. Previously noted microcystic mass in the head of the pancreas is difficult to  discretely visualize on today's examination (arterial phase images were not obtained), but appears slightly larger than the prior study from 03/02/2013, currently measuring approximately 2.3 x 4.2 cm. The appearance of the gallbladder, spleen and bilateral adrenal glands is unremarkable. Interval enlargement of a 2.7 cm lesion in the upper pole of the right kidney which is generally low-attenuation, however, there is what appears to  be an enhancing mural nodule along the posterior aspect of this lesion (nodule measures approximately 10 mm), concerning for a cystic renal cell neoplasm. In addition, there is soft tissue infiltration in the region of the collecting systems and renal pelvises bilaterally, which has progressively worsened over consecutive prior examinations. This is most evident on the delayed images where there is severe attenuation of the urinary contrast in the collecting systems of the kidneys bilaterally.  Trace volume of ascites. No pneumoperitoneum. No pathologic distention of small bowel. No lymphadenopathy noted in the abdomen or pelvis. Extensive atherosclerosis throughout the abdominal and pelvic vasculature, without evidence of aneurysm. Status post hysterectomy. Ovaries are unremarkable in appearance. Urinary bladder is normal in appearance. Specifically, no urothelial lesion is identified in the urinary bladder.  Musculoskeletal: There are no aggressive appearing lytic or blastic lesions noted in the visualized portions of the skeleton.  IMPRESSION: 1. No acute findings to account for the patient's history of vomiting. 2. 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 Continued attention on future follow-up imaging in 1 year is recommended. 3. Grossly abnormal appearance of the soft tissue in the collecting systems of the kidneys and renal pelves bilaterally, which has  progressively worsened over prior examinations, concerning for potential bilateral upper tract urothelial neoplasm. This may alternatively be secondary to chronic inflammation, but nonemergent urologic consultation for further workup and evaluation is strongly recommended in the near future. 4. Additionally, there appears to be an enlarging cystic renal cell neoplasm with an enhancing mural nodule in the upper pole of the right kidney that measures approximately 2.7 cm. As stated above, nonemergent urologic consultation is recommended. 5. Trace volume of ascites. 6. Small right and trace left chronic pleural effusions again noted. 7. Atherosclerosis, including at least 2 vessel coronary artery disease. Please note that although the presence of coronary artery calcium documents the presence of coronary artery disease, the severity of this disease and any potential stenosis cannot be assessed on this non-gated CT examination. Assessment for potential risk factor modification, dietary therapy or pharmacologic therapy may be warranted, if clinically indicated. 8. Additional incidental findings, as above.   Electronically Signed   By: Vinnie Langton M.D.   On: 06/03/2014 23:06   Dg Chest Port 1v Same Day  06/04/2014   CLINICAL DATA:  Cough, leukocytosis.  EXAM: PORTABLE CHEST - 1 VIEW SAME DAY  COMPARISON:  06/03/2014.  FINDINGS: Trachea is midline. Heart size stable. Nasogastric tube has been removed. Right IJ central line tip projects over the SVC RA junction. Mild diffuse interstitial prominence and indistinctness without focal airspace consolidation. No definite pleural fluid. Right hemidiaphragm is elevated.  IMPRESSION: Mild diffuse interstitial prominence and indistinctness. Pulmonary edema is favored.   Electronically Signed   By: Lorin Picket M.D.   On: 06/04/2014 19:33   Dg Abd Acute W/chest  06/03/2014   CLINICAL DATA:  Abdominal pain and vomiting.  EXAM: ACUTE ABDOMEN SERIES (ABDOMEN 2 VIEW & CHEST  1 VIEW)  COMPARISON:  02/23/2014 chest x-ray.  FINDINGS: The right IJ catheter is stable. There is an NG tube tip is in the body region of the stomach. The heart is mildly enlarged but stable. There is tortuosity and calcification of the thoracic aorta. No acute pulmonary findings.  The abdominal bowel gas pattern demonstrates a paucity of bowel gas. This can occasionally be seen with a small bowel obstruction an fluid-filled  loops of small bowel. I do not see any obvious stool or air in the colon. The soft tissue shadows are maintained. No worrisome calcifications. No definite free air. The bony structures are intact.  IMPRESSION: Paucity of abdominal bowel gas. This can occasionally be seen with small bowel obstructions with fluid-filled loops of small bowel.  No free air.  No significant pulmonary findings.   Electronically Signed   By: Kalman Jewels M.D.   On: 06/03/2014 19:31    Scheduled Meds: . antiseptic oral rinse  7 mL Mouth Rinse q12n4p  . aspirin EC  81 mg Oral Daily  . atorvastatin  20 mg Oral q1800  . chlorhexidine  15 mL Mouth Rinse BID  . insulin aspart  0-9 Units Subcutaneous TID WC  . insulin glargine  12 Units Subcutaneous q morning - 10a  . levothyroxine  12.5 mcg Intravenous QAC breakfast  . metoprolol succinate  100 mg Oral Daily  . [START ON 06/07/2014] pantoprazole (PROTONIX) IV  40 mg Intravenous Q12H  . potassium chloride  40 mEq Oral BID   Continuous Infusions: . epoprostenol 33.5 ng/kg/min (06/04/14 1836)  . pantoprozole (PROTONIX) infusion 8 mg/hr (06/04/14 2242)   Antibiotics Given (last 72 hours)   None      Active Problems:   Upper GI bleed   Fever   Elevated troponin   Hypokalemia   NSTEMI (non-ST elevated myocardial infarction)    Time spent: 64min    [facility] Pager [phone]. If 7PM-7AM, please contact night-coverage at [email or web address], password [facility] 06/05/2014, 8:48 AM  LOS: 2 days

## 2014-06-05 NOTE — Progress Notes (Addendum)
Advanced Heart Failure Rounding Note   Subjective:     Remains nauseated and weak. Denies CP or dyspnea. No further coffee grounds.   Troponin stable at 3.5. White count improving.    Objective:   Weight Range:  Vital Signs:   Temp:  [98.2 F (36.8 C)-99.4 F (37.4 C)] 98.2 F (36.8 C) (08/15 1124) Pulse Rate:  [98-112] 99 (08/15 1124) Resp:  [19-35] 26 (08/15 1124) BP: (85-182)/(26-97) 133/69 mmHg (08/15 1124) SpO2:  [96 %-100 %] 100 % (08/15 1124) Last BM Date: 06/04/14  Weight change: Filed Weights   06/03/14 1733 06/03/14 2330  Weight: 70.308 kg (155 lb) 68.9 kg (151 lb 14.4 oz)    Intake/Output:   Intake/Output Summary (Last 24 hours) at 06/05/14 1210 Last data filed at 06/05/14 0800  Gross per 24 hour  Intake  741.5 ml  Output    251 ml  Net  490.5 ml     Physical Exam:  General: Chronically-ill appearing. Lying flat in bed Nauseated. Wearing [place] O2  HEENT: normal  Neck: thick . JVP ~7 Carotids 2+ bilat; no bruits. No lymphadenopathy or thryomegaly appreciated.  Cor: PMI nonpalpable Regular tachy. 2/6 TR. P2 perhaps mildly accentuated but not severely so  Hickman catheter ok  Lungs: clear but decreased throughout no wheezing  Abdomen: obese. soft, nontender, nondistended. No hepatosplenomegaly. No bruits or masses. hypoactivends.  Extremities: no cyanosis, clubbing, macular-papular erythematous rash on LEs, no edema  Neuro: alert & oriented x 3, cranial nerves grossly intact. moves all 4 extremities w/o difficulty. Affect pleasant.      Telemetry: SR with RBBB  Labs: Basic Metabolic Panel:  Recent Labs Lab 06/03/14 1750 06/04/14 0009 06/04/14 0420 06/05/14 0003  NA 141  --  146 138  K 2.5*  --  3.0* 3.4*  CL 91*  --  97 96  CO2 32  --  36* 27  GLUCOSE 216*  --  99 135*  BUN 12  --  11 13  CREATININE 1.00  --  1.13* 1.11*  CALCIUM 10.1  --  9.2 8.8  MG  --  1.7  --   --   PHOS  --   --  4.2  --     Liver Function Tests:  Recent  Labs Lab 06/03/14 1750 06/04/14 0420  AST 22 26  ALT 11 9  ALKPHOS 123* 98  BILITOT 0.6 0.5  PROT 8.0 6.5  ALBUMIN 4.0 3.3*    Recent Labs Lab 06/03/14 1750  LIPASE 9*   No results found for this basename: AMMONIA,  in the last 168 hours  CBC:  Recent Labs Lab 06/03/14 1750 06/04/14 0420 06/04/14 0940 06/04/14 1705 06/05/14 0003 06/05/14 0813  WBC 21.3* 21.6* 22.6* 26.2* 22.5* 17.1*  NEUTROABS 19.7* 19.2*  --   --   --   --   HGB 12.0 10.8* 10.6* 12.5 13.1 12.0  HCT 35.7* 33.4* 32.7* 38.7 40.1 37.7  MCV 85.6 87.4 86.7 87.4 89.1 86.7  PLT 444* 400 359 386 343 299    Cardiac Enzymes:  Recent Labs Lab 06/03/14 1750 06/03/14 2311 06/04/14 0420 06/04/14 2210  TROPONINI 0.33* 0.42* 3.55* 3.49*    BNP: BNP (last 3 results)  Recent Labs  02/23/14 1006  PROBNP 803.2*     Other results:  EKG:   Imaging: Ct Abdomen Pelvis W Contrast  06/03/2014   CLINICAL DATA:  Vomiting.  EXAM: CT ABDOMEN AND PELVIS WITH CONTRAST  TECHNIQUE: Multidetector CT imaging of the abdomen  and pelvis was performed using the standard protocol following bolus administration of intravenous contrast.  CONTRAST:  154mL OMNIPAQUE IOHEXOL 300 MG/ML  SOLN  COMPARISON:  CT of the abdomen and pelvis 03/02/2013.  FINDINGS: Lung Bases: Small chronic right-sided pleural effusion with chronic pleural calcifications. Extensive scarring throughout the visualize lung bases bilaterally, similar to the prior examination. Trace chronic left pleural effusion. Central venous catheter tip in the right atrium. Extensive atherosclerosis, including left anterior descending and right coronary artery disease.  Abdomen/Pelvis: Nasogastric tube extends into the mid stomach. Several sub cm low-attenuation lesions in the liver are too small to characterize, but are unchanged compared to the prior examinations in favored to represent benign lesions such as tiny cysts. Previously noted microcystic mass in the head of the  pancreas is difficult to discretely visualize on today's examination (arterial phase images were not obtained), but appears slightly larger than the prior study from 03/02/2013, currently measuring approximately 2.3 x 4.2 cm. The appearance of the gallbladder, spleen and bilateral adrenal glands is unremarkable. Interval enlargement of a 2.7 cm lesion in the upper pole of the right kidney which is generally low-attenuation, however, there is what appears to be an enhancing mural nodule along the posterior aspect of this lesion (nodule measures approximately 10 mm), concerning for a cystic renal cell neoplasm. In addition, there is soft tissue infiltration in the region of the collecting systems and renal pelvises bilaterally, which has progressively worsened over consecutive prior examinations. This is most evident on the delayed images where there is severe attenuation of the urinary contrast in the collecting systems of the kidneys bilaterally.  Trace volume of ascites. No pneumoperitoneum. No pathologic distention of small bowel. No lymphadenopathy noted in the abdomen or pelvis. Extensive atherosclerosis throughout the abdominal and pelvic vasculature, without evidence of aneurysm. Status post hysterectomy. Ovaries are unremarkable in appearance. Urinary bladder is normal in appearance. Specifically, no urothelial lesion is identified in the urinary bladder.  Musculoskeletal: There are no aggressive appearing lytic or blastic lesions noted in the visualized portions of the skeleton.  IMPRESSION: 1. No acute findings to account for the patient's history of vomiting. 2. 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 Continued attention on future follow-up imaging in 1 year is recommended. 3. Grossly abnormal appearance of the soft tissue in the collecting systems of the kidneys and renal  pelves bilaterally, which has progressively worsened over prior examinations, concerning for potential bilateral upper tract urothelial neoplasm. This may alternatively be secondary to chronic inflammation, but nonemergent urologic consultation for further workup and evaluation is strongly recommended in the near future. 4. Additionally, there appears to be an enlarging cystic renal cell neoplasm with an enhancing mural nodule in the upper pole of the right kidney that measures approximately 2.7 cm. As stated above, nonemergent urologic consultation is recommended. 5. Trace volume of ascites. 6. Small right and trace left chronic pleural effusions again noted. 7. Atherosclerosis, including at least 2 vessel coronary artery disease. Please note that although the presence of coronary artery calcium documents the presence of coronary artery disease, the severity of this disease and any potential stenosis cannot be assessed on this non-gated CT examination. Assessment for potential risk factor modification, dietary therapy or pharmacologic therapy may be warranted, if clinically indicated. 8. Additional incidental findings, as above.   Electronically Signed   By: Vinnie Langton M.D.   On: 06/03/2014  23:06   Dg Chest Port 1v Same Day  06/04/2014   CLINICAL DATA:  Cough, leukocytosis.  EXAM: PORTABLE CHEST - 1 VIEW SAME DAY  COMPARISON:  06/03/2014.  FINDINGS: Trachea is midline. Heart size stable. Nasogastric tube has been removed. Right IJ central line tip projects over the SVC RA junction. Mild diffuse interstitial prominence and indistinctness without focal airspace consolidation. No definite pleural fluid. Right hemidiaphragm is elevated.  IMPRESSION: Mild diffuse interstitial prominence and indistinctness. Pulmonary edema is favored.   Electronically Signed   By: Lorin Picket M.D.   On: 06/04/2014 19:33   Dg Abd Acute W/chest  06/03/2014   CLINICAL DATA:  Abdominal pain and vomiting.  EXAM: ACUTE ABDOMEN  SERIES (ABDOMEN 2 VIEW & CHEST 1 VIEW)  COMPARISON:  02/23/2014 chest x-ray.  FINDINGS: The right IJ catheter is stable. There is an NG tube tip is in the body region of the stomach. The heart is mildly enlarged but stable. There is tortuosity and calcification of the thoracic aorta. No acute pulmonary findings.  The abdominal bowel gas pattern demonstrates a paucity of bowel gas. This can occasionally be seen with a small bowel obstruction an fluid-filled loops of small bowel. I do not see any obvious stool or air in the colon. The soft tissue shadows are maintained. No worrisome calcifications. No definite free air. The bony structures are intact.  IMPRESSION: Paucity of abdominal bowel gas. This can occasionally be seen with small bowel obstructions with fluid-filled loops of small bowel.  No free air.  No significant pulmonary findings.   Electronically Signed   By: Kalman Jewels M.D.   On: 06/03/2014 19:31      Medications:     Scheduled Medications: . antiseptic oral rinse  7 mL Mouth Rinse q12n4p  . aspirin EC  81 mg Oral Daily  . atorvastatin  20 mg Oral q1800  . chlorhexidine  15 mL Mouth Rinse BID  . insulin aspart  0-9 Units Subcutaneous TID WC  . insulin glargine  12 Units Subcutaneous q morning - 10a  . levothyroxine  12.5 mcg Intravenous QAC breakfast  . metoprolol succinate  100 mg Oral Daily  . [START ON 06/07/2014] pantoprazole (PROTONIX) IV  40 mg Intravenous Q12H  . sucralfate  1 g Oral 4 times per day     Infusions: . epoprostenol 33.5 ng/kg/min (06/04/14 1836)  . pantoprozole (PROTONIX) infusion 8 mg/hr (06/05/14 0859)     PRN Medications:  acetaminophen, acetaminophen, alum & mag hydroxide-simeth, docusate sodium, hydrALAZINE, HYDROmorphone (DILAUDID) injection, LORazepam, metoCLOPramide (REGLAN) injection, nitroGLYCERIN, ondansetron (ZOFRAN) IV   Assessment:   1. Hematemesis  2. NSTEMI  3. Right renal mass  4. Severe PAH on Flolan  5. CAD s/p CABG  6.  DM2  7. Leukocytosis 8. hypokalemia   Plan/Discussion:    Remains nauseated. No further bleeding. Troponin stable at 3.5. No CP - doubt NSTEMI.   Leukocytosis improving. I wonder if she has DM-gastroparesis. Doubt that this is related to low cardiac output. Could consider emptying study. Will leave to IM and GI to decide. Ok for EGD from my standpoint, if needed.  Remains on Flolan for PAH.  Will check echo. Supp K+. No further recs at this point. Can go to tele from my perspective.    Length of Stay: 2  Glori Bickers MD 06/05/2014, 12:10 PM  Advanced Heart Failure Team Pager [phone] (M-F; 7a - 4p)  Please contact [facility] for night-coverage after hours (4p -7a ) and  weekends on [email or web address]

## 2014-06-05 NOTE — Progress Notes (Addendum)
Subjective  Nauseated but no vomiting, c/o subxyphoid burning, " If I just could sit up"   Objective   s/p intractable N&V 3 days ago, now non STEMI, hematemesis has subsided, she has been hemodynamically stable, stools heme positive. She has a hx of chronic nausea ? ethiology? Vital signs in last 24 hours: Temp:  [98.2 F (36.8 C)-99.7 F (37.6 C)] 98.3 F (36.8 C) (08/15 0712) Pulse Rate:  [98-112] 112 (08/15 0712) Resp:  [19-35] 26 (08/15 0805) BP: (85-182)/(26-97) 155/65 mmHg (08/15 0805) SpO2:  [96 %-100 %] 100 % (08/15 0805) Last BM Date: 06/04/14 General:    white female appears sick, nauseated Heart:Rapid Regular rate and rhythm; no murmurs Lungs: Respirations even and unlabored, lungs CTA bilaterally Abdomen:  Soft, nontender and nondistended., obeseNormal bowel sounds. Extremities:  Without edema. Neurologic:  Alert and oriented,  grossly normal neurologically. Psych:  Cooperative. Normal mood and affect.  Intake/Output from previous day: 08/14 0701 - 08/15 0700 In: 1254.2 [P.O.:180; I.V.:674.2; IV Piggyback:400] Out: 653 [Urine:650; Stool:3] Intake/Output this shift: Total I/O In: 28.2 [I.V.:28.2] Out: -   Lab Results:  Recent Labs  06/04/14 1705 06/05/14 0003 06/05/14 0813  WBC 26.2* 22.5* 17.1*  HGB 12.5 13.1 12.0  HCT 38.7 40.1 37.7  PLT 386 343 299   BMET  Recent Labs  06/03/14 1750 06/04/14 0420 06/05/14 0003  NA 141 146 138  K 2.5* 3.0* 3.4*  CL 91* 97 96  CO2 32 36* 27  GLUCOSE 216* 99 135*  BUN 12 11 13   CREATININE 1.00 1.13* 1.11*  CALCIUM 10.1 9.2 8.8   LFT  Recent Labs  06/04/14 0420  PROT 6.5  ALBUMIN 3.3*  AST 26  ALT 9  ALKPHOS 98  BILITOT 0.5   PT/INR  Recent Labs  06/03/14 1750  LABPROT 13.5  INR 1.03    Studies/Results: Ct Abdomen Pelvis W Contrast  06/03/2014   CLINICAL DATA:  Vomiting.  EXAM: CT ABDOMEN AND PELVIS WITH CONTRAST  TECHNIQUE: Multidetector CT imaging of the abdomen and pelvis was  performed using the standard protocol following bolus administration of intravenous contrast.  CONTRAST:  158mL OMNIPAQUE IOHEXOL 300 MG/ML  SOLN  COMPARISON:  CT of the abdomen and pelvis 03/02/2013.  FINDINGS: Lung Bases: Small chronic right-sided pleural effusion with chronic pleural calcifications. Extensive scarring throughout the visualize lung bases bilaterally, similar to the prior examination. Trace chronic left pleural effusion. Central venous catheter tip in the right atrium. Extensive atherosclerosis, including left anterior descending and right coronary artery disease.  Abdomen/Pelvis: Nasogastric tube extends into the mid stomach. Several sub cm low-attenuation lesions in the liver are too small to characterize, but are unchanged compared to the prior examinations in favored to represent benign lesions such as tiny cysts. Previously noted microcystic mass in the head of the pancreas is difficult to discretely visualize on today's examination (arterial phase images were not obtained), but appears slightly larger than the prior study from 03/02/2013, currently measuring approximately 2.3 x 4.2 cm. The appearance of the gallbladder, spleen and bilateral adrenal glands is unremarkable. Interval enlargement of a 2.7 cm lesion in the upper pole of the right kidney which is generally low-attenuation, however, there is what appears to be an enhancing mural nodule along the posterior aspect of this lesion (nodule measures approximately 10 mm), concerning for a cystic renal cell neoplasm. In addition, there is soft tissue infiltration in the region of the collecting systems and renal pelvises bilaterally, which has progressively worsened over consecutive  prior examinations. This is most evident on the delayed images where there is severe attenuation of the urinary contrast in the collecting systems of the kidneys bilaterally.  Trace volume of ascites. No pneumoperitoneum. No pathologic distention of small bowel.  No lymphadenopathy noted in the abdomen or pelvis. Extensive atherosclerosis throughout the abdominal and pelvic vasculature, without evidence of aneurysm. Status post hysterectomy. Ovaries are unremarkable in appearance. Urinary bladder is normal in appearance. Specifically, no urothelial lesion is identified in the urinary bladder.  Musculoskeletal: There are no aggressive appearing lytic or blastic lesions noted in the visualized portions of the skeleton.  IMPRESSION: 1. No acute findings to account for the patient's history of vomiting. 2. 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 Continued attention on future follow-up imaging in 1 year is recommended. 3. Grossly abnormal appearance of the soft tissue in the collecting systems of the kidneys and renal pelves bilaterally, which has progressively worsened over prior examinations, concerning for potential bilateral upper tract urothelial neoplasm. This may alternatively be secondary to chronic inflammation, but nonemergent urologic consultation for further workup and evaluation is strongly recommended in the near future. 4. Additionally, there appears to be an enlarging cystic renal cell neoplasm with an enhancing mural nodule in the upper pole of the right kidney that measures approximately 2.7 cm. As stated above, nonemergent urologic consultation is recommended. 5. Trace volume of ascites. 6. Small right and trace left chronic pleural effusions again noted. 7. Atherosclerosis, including at least 2 vessel coronary artery disease. Please note that although the presence of coronary artery calcium documents the presence of coronary artery disease, the severity of this disease and any potential stenosis cannot be assessed on this non-gated CT examination. Assessment for potential risk factor modification, dietary therapy or pharmacologic  therapy may be warranted, if clinically indicated. 8. Additional incidental findings, as above.   Electronically Signed   By: Vinnie Langton M.D.   On: 06/03/2014 23:06   Dg Chest Port 1v Same Day  06/04/2014   CLINICAL DATA:  Cough, leukocytosis.  EXAM: PORTABLE CHEST - 1 VIEW SAME DAY  COMPARISON:  06/03/2014.  FINDINGS: Trachea is midline. Heart size stable. Nasogastric tube has been removed. Right IJ central line tip projects over the SVC RA junction. Mild diffuse interstitial prominence and indistinctness without focal airspace consolidation. No definite pleural fluid. Right hemidiaphragm is elevated.  IMPRESSION: Mild diffuse interstitial prominence and indistinctness. Pulmonary edema is favored.   Electronically Signed   By: Lorin Picket M.D.   On: 06/04/2014 19:33   Dg Abd Acute W/chest  06/03/2014   CLINICAL DATA:  Abdominal pain and vomiting.  EXAM: ACUTE ABDOMEN SERIES (ABDOMEN 2 VIEW & CHEST 1 VIEW)  COMPARISON:  02/23/2014 chest x-ray.  FINDINGS: The right IJ catheter is stable. There is an NG tube tip is in the body region of the stomach. The heart is mildly enlarged but stable. There is tortuosity and calcification of the thoracic aorta. No acute pulmonary findings.  The abdominal bowel gas pattern demonstrates a paucity of bowel gas. This can occasionally be seen with a small bowel obstruction an fluid-filled loops of small bowel. I do not see any obvious stool or air in the colon. The soft tissue shadows are maintained. No worrisome calcifications. No definite free air. The bony structures are intact.  IMPRESSION: Paucity of abdominal bowel gas. This can occasionally be seen with small  bowel obstructions with fluid-filled loops of small bowel.  No free air.  No significant pulmonary findings.   Electronically Signed   By: Kalman Jewels M.D.   On: 06/03/2014 19:31       Assessment / Plan:   Suspect Mallory-Weiss tear/esophagitis, possible Cameron erosions from known hiatal hernia (  EGD 2011), doing  OK from GI standpoint. Will not EGD in the setting of acute  MI if she remains  Stable. Will add Carafate slurry, antireflux measures ( OK to sit in a recliner), continue PPI,  Active Problems:   Upper GI bleed   Fever   Elevated troponin   Hypokalemia   NSTEMI (non-ST elevated myocardial infarction)     LOS: 2 days   Delfin Edis  06/05/2014, 10:21 AM  Addendum: Dr Haroldine Laws  OK's dignostic EGD given cardiac status. Will consider EGD tomorrow to assess hematemesis and nausea. NPO after MN

## 2014-06-06 ENCOUNTER — Encounter (HOSPITAL_COMMUNITY)
Admission: EM | Disposition: A | Discharge status: Home Health | Payer: Medicare Other | Source: Home / Self Care | Attending: Internal Medicine

## 2014-06-06 ENCOUNTER — Encounter (HOSPITAL_COMMUNITY): Payer: Self-pay | Admitting: Gastroenterology

## 2014-06-06 DIAGNOSIS — K2100 Gastro-esophageal reflux disease with esophagitis, without bleeding: Secondary | ICD-10-CM

## 2014-06-06 DIAGNOSIS — R1319 Other dysphagia: Secondary | ICD-10-CM

## 2014-06-06 DIAGNOSIS — R110 Nausea: Secondary | ICD-10-CM

## 2014-06-06 HISTORY — PX: ESOPHAGOGASTRODUODENOSCOPY: SHX5428

## 2014-06-06 LAB — APTT: APTT: 28 s (ref 24–37)

## 2014-06-06 LAB — GLUCOSE, CAPILLARY
GLUCOSE-CAPILLARY: 150 mg/dL — AB (ref 70–99)
Glucose-Capillary: 160 mg/dL — ABNORMAL HIGH (ref 70–99)
Glucose-Capillary: 169 mg/dL — ABNORMAL HIGH (ref 70–99)
Glucose-Capillary: 206 mg/dL — ABNORMAL HIGH (ref 70–99)

## 2014-06-06 LAB — BASIC METABOLIC PANEL
ANION GAP: 15 (ref 5–15)
BUN: 22 mg/dL (ref 6–23)
CO2: 24 mEq/L (ref 19–32)
Calcium: 9 mg/dL (ref 8.4–10.5)
Chloride: 95 mEq/L — ABNORMAL LOW (ref 96–112)
Creatinine, Ser: 1.46 mg/dL — ABNORMAL HIGH (ref 0.50–1.10)
GFR calc Af Amer: 41 mL/min — ABNORMAL LOW (ref >90)
GFR calc non Af Amer: 36 mL/min — ABNORMAL LOW (ref >90)
GLUCOSE: 172 mg/dL — AB (ref 70–99)
POTASSIUM: 4.3 meq/L (ref 3.7–5.3)
Sodium: 134 mEq/L — ABNORMAL LOW (ref 137–147)

## 2014-06-06 LAB — PROTIME-INR
INR: 1.08 (ref 0.00–1.49)
Prothrombin Time: 14 seconds (ref 11.6–15.2)

## 2014-06-06 LAB — CBC
HEMATOCRIT: 38.6 % (ref 36.0–46.0)
Hemoglobin: 12.6 g/dL (ref 12.0–15.0)
MCH: 28.6 pg (ref 26.0–34.0)
MCHC: 32.6 g/dL (ref 30.0–36.0)
MCV: 87.7 fL (ref 78.0–100.0)
Platelets: 328 10*3/uL (ref 150–400)
RBC: 4.4 MIL/uL (ref 3.87–5.11)
RDW: 13.7 % (ref 11.5–15.5)
WBC: 22.7 10*3/uL — AB (ref 4.0–10.5)

## 2014-06-06 SURGERY — EGD (ESOPHAGOGASTRODUODENOSCOPY)
Anesthesia: Moderate Sedation

## 2014-06-06 MED ORDER — FENTANYL CITRATE 0.05 MG/ML IJ SOLN
INTRAMUSCULAR | Status: AC
  Filled 2014-06-06: qty 2

## 2014-06-06 MED ORDER — FENTANYL CITRATE 0.05 MG/ML IJ SOLN
INTRAMUSCULAR | Status: DC | PRN
  Administered 2014-06-06 (×2): 25 ug via INTRAVENOUS

## 2014-06-06 MED ORDER — HYDROMORPHONE HCL PF 1 MG/ML IJ SOLN
1.0000 mg | INTRAMUSCULAR | Status: DC | PRN
  Administered 2014-06-06: 1 mg via INTRAVENOUS
  Administered 2014-06-06: 2 mg via INTRAVENOUS
  Administered 2014-06-06: 1 mg via INTRAVENOUS
  Administered 2014-06-07: 2 mg via INTRAVENOUS
  Administered 2014-06-07 (×2): 1 mg via INTRAVENOUS
  Administered 2014-06-07 (×2): 2 mg via INTRAVENOUS
  Administered 2014-06-08 (×3): 1 mg via INTRAVENOUS
  Administered 2014-06-08 – 2014-06-09 (×6): 2 mg via INTRAVENOUS
  Filled 2014-06-06 (×7): qty 2
  Filled 2014-06-06: qty 1
  Filled 2014-06-06: qty 2
  Filled 2014-06-06 (×4): qty 1
  Filled 2014-06-06 (×3): qty 2
  Filled 2014-06-06: qty 1

## 2014-06-06 MED ORDER — MIDAZOLAM HCL 5 MG/ML IJ SOLN
INTRAMUSCULAR | Status: AC
  Filled 2014-06-06: qty 2

## 2014-06-06 MED ORDER — BUTAMBEN-TETRACAINE-BENZOCAINE 2-2-14 % EX AERO
INHALATION_SPRAY | CUTANEOUS | Status: DC | PRN
  Administered 2014-06-06 (×2): 1 via TOPICAL

## 2014-06-06 MED ORDER — MIDAZOLAM HCL 10 MG/2ML IJ SOLN
INTRAMUSCULAR | Status: DC | PRN
  Administered 2014-06-06 (×2): 2 mg via INTRAVENOUS

## 2014-06-06 NOTE — Progress Notes (Signed)
.     Subjective  Aching all over ,still nauseated, thirsty, no vomiting   Objective  No further hematemesis,  will proceed with EGD,  With minimal sedation - O2 sats 100% Vital signs in last 24 hours: Temp:  [98.2 F (36.8 C)-99.2 F (37.3 C)] 99.2 F (37.3 C) (08/16 0324) Pulse Rate:  [88-99] 88 (08/16 0324) Resp:  [20-40] 29 (08/16 0500) BP: (99-198)/(35-75) 157/60 mmHg (08/16 0700) SpO2:  [97 %-100 %] 100 % (08/16 0700) Weight:  [154 lb 8.7 oz (70.1 kg)] 154 lb 8.7 oz (70.1 kg) (08/16 0600) Last BM Date: 06/04/14 General:    white female complaining of not feeling well Heart:  Regular rate and rhythm; no murmurs Lungs: Respirations even and unlabored, lungs CTA bilaterally Abdomen:  Soft, nontender and nondistended. Normal bowel sounds. Extremities:  Without edema. Neurologic:  Alert and oriented,  grossly normal neurologically. Psych:  Cooperative. Normal mood and affect.  Intake/Output from previous day: 08/15 0701 - 08/16 0700 In: 1001.1 [P.O.:180; I.V.:821.1] Out: -  Intake/Output this shift:    Lab Results:  Recent Labs  06/05/14 0003 06/05/14 0813 06/06/14 0550  WBC 22.5* 17.1* 22.7*  HGB 13.1 12.0 12.6  HCT 40.1 37.7 38.6  PLT 343 299 328   BMET  Recent Labs  06/04/14 0420 06/05/14 0003 06/06/14 0550  NA 146 138 134*  K 3.0* 3.4* 4.3  CL 97 96 95*  CO2 36* 27 24  GLUCOSE 99 135* 172*  BUN 11 13 22   CREATININE 1.13* 1.11* 1.46*  CALCIUM 9.2 8.8 9.0   LFT  Recent Labs  06/04/14 0420  PROT 6.5  ALBUMIN 3.3*  AST 26  ALT 9  ALKPHOS 98  BILITOT 0.5   PT/INR  Recent Labs  06/03/14 1750 06/06/14 0550  LABPROT 13.5 14.0  INR 1.03 1.08    Studies/Results: Dg Chest Port 1v Same Day  06/04/2014   CLINICAL DATA:  Cough, leukocytosis.  EXAM: PORTABLE CHEST - 1 VIEW SAME DAY  COMPARISON:  06/03/2014.  FINDINGS: Trachea is midline. Heart size stable. Nasogastric tube has been removed. Right IJ central line tip projects over the SVC RA  junction. Mild diffuse interstitial prominence and indistinctness without focal airspace consolidation. No definite pleural fluid. Right hemidiaphragm is elevated.  IMPRESSION: Mild diffuse interstitial prominence and indistinctness. Pulmonary edema is favored.   Electronically Signed   By: Lorin Picket M.D.   On: 06/04/2014 19:33       Assessment / Plan:   Resolved hematemesis, odynophagia, suspect esophagitis/Mallory/Weiss tear, will proceed with EGD today. Continue PPI and Carafate slurry. Discussed with the pt and her husband.  Active Problems:   Upper GI bleed   Fever   Elevated troponin   Hypokalemia   NSTEMI (non-ST elevated myocardial infarction)     LOS: 3 days   Delfin Edis  06/06/2014, 7:42 AM

## 2014-06-06 NOTE — Progress Notes (Signed)
  Echo reviewed. LVEF preserved. RV looks ok. PA pressures appear normal on Flolan.  Pending EGD for persistent nausea.  I will see again in am.   Benay Spice 10:51 AM

## 2014-06-06 NOTE — Progress Notes (Signed)
Pt transferred from [facility] to room 3E05 via bed. Spouse at bedside. Pt alert & oriented x 4. Denies pain or concerns. Pt with Flolan pump infusing into PAC. Protonix gtt infusing in PIV. Call bell placed in reach. Oriented pt and spouse to unit and room. Will continue to monitor pt closely.  Eulis Canner, RN

## 2014-06-06 NOTE — Progress Notes (Signed)
[facility] PROGRESS NOTE  Meagan [name] OXB:[id number] DOB: June 02, 1946 DOA: 06/03/2014 PCP: Glo Herring., MD  Assessment/Plan: 1. Melena/Coffee ground emesis-resolved -stop PPI gtt, keep on PPI Q12, supportive care with Zofran/reglan, -EGD today  -Plavix on hold, takes aleve PRN -last EGD per Dr.Rourk in 2011 with Hiatal hernia and Schatzkis ring  -Hb stable -no indication for transfusion  2. NSTEMI / CAD with severe 3 vessel dz  -no chest pain, likely due to demand, ECHO with preserved EF and normal PA pressures on FLolan -Cards following -continue Toprol, statin unable to use antiplatelet agents due to 1 -LHC 5/15 with several 3V disease but not easily revascularizable  3. Chronic resp failure -Severe Pulm HTN and Restrictive lung disease -on 6L Home O2 at baseline and Flolan gtt followed by [facility]  4. DM -continue lantus at half dose, SSI -stable  5. Hypokalemia -replace  6. Leukocytosis  -Source not clear. Patient is hemodynamically stable. FU Blood and urine cultures , monitor clinically for now -could be related to renal issues listed below -afebrile now, monitor off abx  7. Abnormal soft tissue in B/L renal collecting system and Renal cystic mass on CT as well, suspect RCC -URology eval appreciated, outpt FU -not candidate for invasive therapies  8. CHronic pain/fibromyalgia -increase narcotic dose  DVT prophylaxis - SCDs  Code Status: discussed Poor prognosis with spouse at length, he was tearful, finally agreeable to DNR now Family Communication: d/w spouse at bedside Disposition Plan: Tx to tele   Consultants:  Cards  GI  HPI/Subjective: Complains of pain all over, still with nausea but improved No chest pain, no increased dyspnea, breathing at baseline  Objective: Filed Vitals:   06/06/14 0954  BP: 152/52  Pulse: 103  Temp: 98.4 F (36.9 C)  Resp: 20    Intake/Output Summary (Last 24 hours) at 06/06/14 1054 Last data filed at  06/06/14 0900  Gross per 24 hour  Intake 1012.91 ml  Output      0 ml  Net 1012.91 ml   Filed Weights   06/03/14 2330 06/06/14 0600 06/06/14 0954  Weight: 68.9 kg (151 lb 14.4 oz) 70.1 kg (154 lb 8.7 oz) 69.9 kg (154 lb 1.6 oz)    Exam:   General: AAOx2, sleeping, chronically ill  HEENT: flushing of face and upper chest  Cardiovascular: S1S2/RRR  Respiratory: scattered ronchi  Abdomen: soft, Nt, BS present  Musculoskeletal: no edema c/c   Data Reviewed: Basic Metabolic Panel:  Recent Labs Lab 06/03/14 1750 06/04/14 0009 06/04/14 0420 06/05/14 0003 06/06/14 0550  NA 141  --  146 138 134*  K 2.5*  --  3.0* 3.4* 4.3  CL 91*  --  97 96 95*  CO2 32  --  36* 27 24  GLUCOSE 216*  --  99 135* 172*  BUN 12  --  11 13 22   CREATININE 1.00  --  1.13* 1.11* 1.46*  CALCIUM 10.1  --  9.2 8.8 9.0  MG  --  1.7  --   --   --   PHOS  --   --  4.2  --   --    Liver Function Tests:  Recent Labs Lab 06/03/14 1750 06/04/14 0420  AST 22 26  ALT 11 9  ALKPHOS 123* 98  BILITOT 0.6 0.5  PROT 8.0 6.5  ALBUMIN 4.0 3.3*    Recent Labs Lab 06/03/14 1750  LIPASE 9*   No results found for this basename: AMMONIA,  in the last  168 hours CBC:  Recent Labs Lab 06/03/14 1750 06/04/14 0420 06/04/14 0940 06/04/14 1705 06/05/14 0003 06/05/14 0813 06/06/14 0550  WBC 21.3* 21.6* 22.6* 26.2* 22.5* 17.1* 22.7*  NEUTROABS 19.7* 19.2*  --   --   --   --   --   HGB 12.0 10.8* 10.6* 12.5 13.1 12.0 12.6  HCT 35.7* 33.4* 32.7* 38.7 40.1 37.7 38.6  MCV 85.6 87.4 86.7 87.4 89.1 86.7 87.7  PLT 444* 400 359 386 343 299 328   Cardiac Enzymes:  Recent Labs Lab 06/03/14 1750 06/03/14 2311 06/04/14 0420 06/04/14 2210  TROPONINI 0.33* 0.42* 3.55* 3.49*   BNP (last 3 results)  Recent Labs  02/23/14 1006  PROBNP 803.2*   CBG:  Recent Labs Lab 06/04/14 2129 06/05/14 0711 06/05/14 1123 06/05/14 1644 06/05/14 2130  GLUCAP 159* 160* 204* 150* 156*    Recent Results  (from the past 240 hour(s))  CULTURE, BLOOD (ROUTINE X 2)     Status: None   Collection Time    06/03/14  8:17 PM      Result Value Ref Range Status   Specimen Description BLOOD LEFT WRIST   Final   Special Requests BOTTLES DRAWN AEROBIC ONLY 6CC   Final   Culture NO GROWTH 3 DAYS   Final   Report Status PENDING   Incomplete  CULTURE, BLOOD (ROUTINE X 2)     Status: None   Collection Time    06/03/14  8:23 PM      Result Value Ref Range Status   Specimen Description BLOOD LEFT ANTECUBITAL   Final   Special Requests BOTTLES DRAWN AEROBIC ONLY 6CC   Final   Culture NO GROWTH 3 DAYS   Final   Report Status PENDING   Incomplete  URINE CULTURE     Status: None   Collection Time    06/03/14  8:55 PM      Result Value Ref Range Status   Specimen Description URINE, CATHETERIZED   Final   Special Requests NONE   Final   Culture  Setup Time     Final   Value: 06/04/2014 13:57     Performed at [place]     Final   Value: NO GROWTH     Performed at [facility]   Culture     Final   Value: NO GROWTH     Performed at [facility]   Report Status 06/05/2014 FINAL   Final  MRSA PCR SCREENING     Status: None   Collection Time    06/04/14 12:27 AM      Result Value Ref Range Status   MRSA by PCR NEGATIVE  NEGATIVE Final   Comment:            The GeneXpert MRSA Assay (FDA     approved for NASAL specimens     only), is one component of a     comprehensive MRSA colonization     surveillance program. It is not     intended to diagnose MRSA     infection nor to guide or     monitor treatment for     MRSA infections.     Studies: Dg Chest Port 1v Same Day  06/04/2014   CLINICAL DATA:  Cough, leukocytosis.  EXAM: PORTABLE CHEST - 1 VIEW SAME DAY  COMPARISON:  06/03/2014.  FINDINGS: Trachea is midline. Heart size stable. Nasogastric tube has been removed. Right IJ central  line tip projects over the SVC RA junction. Mild diffuse interstitial  prominence and indistinctness without focal airspace consolidation. No definite pleural fluid. Right hemidiaphragm is elevated.  IMPRESSION: Mild diffuse interstitial prominence and indistinctness. Pulmonary edema is favored.   Electronically Signed   By: Lorin Picket M.D.   On: 06/04/2014 19:33    Scheduled Meds: . antiseptic oral rinse  7 mL Mouth Rinse q12n4p  . aspirin EC  81 mg Oral Daily  . atorvastatin  20 mg Oral q1800  . chlorhexidine  15 mL Mouth Rinse BID  . insulin aspart  0-9 Units Subcutaneous TID WC  . insulin glargine  12 Units Subcutaneous q morning - 10a  . levothyroxine  12.5 mcg Intravenous QAC breakfast  . metoprolol succinate  100 mg Oral Daily  . [START ON 06/07/2014] pantoprazole (PROTONIX) IV  40 mg Intravenous Q12H  . sucralfate  1 g Oral 4 times per day   Continuous Infusions: . epoprostenol 33.5 ng/kg/min (06/06/14 0800)  . pantoprozole (PROTONIX) infusion 8 mg/hr (06/06/14 0800)   Antibiotics Given (last 72 hours)   None      Active Problems:   Upper GI bleed   Fever   Elevated troponin   Hypokalemia   NSTEMI (non-ST elevated myocardial infarction)    Time spent: 81min    [facility] Pager [phone]. If 7PM-7AM, please contact night-coverage at [email or web address], password [facility] 06/06/2014, 10:54 AM  LOS: 3 days

## 2014-06-06 NOTE — Op Note (Signed)
[facility] [place], [id number]   ENDOSCOPY PROCEDURE REPORT  PATIENT: Meagan, [name]  MR#: [id number] BIRTHDATE: 1946/01/08 , 78  yrs. old GENDER: Female ENDOSCOPIST: Lafayette Dragon, MD REFERRED BY:  Dr .Broadus John, Dr Sheilah Pigeon PROCEDURE DATE:  06/06/2014 PROCEDURE:  EGD w/ biopsy ASA CLASS:     Class III INDICATIONS:  Nausea.   Vomiting.   Epigastric pain.   pt with non STEMI, developed acute abdominal pain and the intractable vomiting resulting in hematemesis. MEDICATIONS: These medications were titrated to patient response per physician's verbal order, Fentanyl 50 mcg IV, and Versed 4 mg IV TOPICAL ANESTHETIC: Cetacaine Spray  DESCRIPTION OF PROCEDURE: After the risks benefits and alternatives of the procedure were thoroughly explained, informed consent was obtained.  The Pentax Gastroscope [id number] endoscope was introduced through the mouth and advanced to the second portion of the duodenum. Without limitations.  The instrument was slowly withdrawn as the mucosa was fully examined.      Esophagus: proximal and mid esophageal mucosa was normal. There were linear erosions in distal esophagus extending up to 2 cm from the squamocolumnar junction. It was mild nonobstructing stricture. There was no active bleeding and no evidence of Mallory-Weiss tear.  Stomach: there was a small 1-2 cm hiatal hernia which was reducible. Gastric folds were normal. There was no blood or secretions in the stomach. There was mild antral gastritis. Biopsies were obtained to rule out H. pylori. Pyloric outlet was normal. Retroflexion of the endoscope revealed normal fundus and cardia  Duodenum: duodenal bulb and descending duodenum was normal[ The scope was then withdrawn from the patient and the procedure completed.  COMPLICATIONS: There were no complications. ENDOSCOPIC IMPRESSION: 1. grade 2 reflux esophagitis secondary to vomiting 2. mild antral  gastritis. Status post biopsies to rule out H. pylori  Hematemesis was slightly related to reflux esophagitis and vomiting and seems to be resolving at this point  RECOMMENDATIONS: 1.  Await pathology results 2.  Anti-reflux regimen to be follow 3.  Upper abdominal ultrasound revealed multiple small gallstones, she has had intermittent elevation of liver function tests, Possibility that current episode of abdominal pain was caused by biliary colic We'll proceed with the hepatobiliary scan Advance diet  REPEAT EXAM: for EGD pending biopsy results.  eSigned:  Lafayette Dragon, MD 06/06/2014 2:24 PM   CC:  PATIENT NAME:  Meagan, [name] MR#: [id number]

## 2014-06-07 ENCOUNTER — Inpatient Hospital Stay (HOSPITAL_COMMUNITY): Payer: Medicare Other

## 2014-06-07 ENCOUNTER — Encounter (HOSPITAL_COMMUNITY): Payer: Self-pay | Admitting: Internal Medicine

## 2014-06-07 LAB — CBC
HEMATOCRIT: 36.9 % (ref 36.0–46.0)
Hemoglobin: 11.9 g/dL — ABNORMAL LOW (ref 12.0–15.0)
MCH: 28.1 pg (ref 26.0–34.0)
MCHC: 32.2 g/dL (ref 30.0–36.0)
MCV: 87.2 fL (ref 78.0–100.0)
Platelets: 274 10*3/uL (ref 150–400)
RBC: 4.23 MIL/uL (ref 3.87–5.11)
RDW: 13.6 % (ref 11.5–15.5)
WBC: 18.8 10*3/uL — AB (ref 4.0–10.5)

## 2014-06-07 LAB — COMPREHENSIVE METABOLIC PANEL
ALT: 23 U/L (ref 0–35)
AST: 29 U/L (ref 0–37)
Albumin: 3 g/dL — ABNORMAL LOW (ref 3.5–5.2)
Alkaline Phosphatase: 83 U/L (ref 39–117)
Anion gap: 13 (ref 5–15)
BILIRUBIN TOTAL: 0.7 mg/dL (ref 0.3–1.2)
BUN: 25 mg/dL — ABNORMAL HIGH (ref 6–23)
CHLORIDE: 98 meq/L (ref 96–112)
CO2: 25 mEq/L (ref 19–32)
Calcium: 8.8 mg/dL (ref 8.4–10.5)
Creatinine, Ser: 1.54 mg/dL — ABNORMAL HIGH (ref 0.50–1.10)
GFR, EST AFRICAN AMERICAN: 39 mL/min — AB (ref >90)
GFR, EST NON AFRICAN AMERICAN: 33 mL/min — AB (ref >90)
Glucose, Bld: 130 mg/dL — ABNORMAL HIGH (ref 70–99)
Potassium: 4.1 mEq/L (ref 3.7–5.3)
Sodium: 136 mEq/L — ABNORMAL LOW (ref 137–147)
Total Protein: 5.8 g/dL — ABNORMAL LOW (ref 6.0–8.3)

## 2014-06-07 LAB — GLUCOSE, CAPILLARY
GLUCOSE-CAPILLARY: 214 mg/dL — AB (ref 70–99)
Glucose-Capillary: 157 mg/dL — ABNORMAL HIGH (ref 70–99)
Glucose-Capillary: 144 mg/dL — ABNORMAL HIGH (ref 70–99)
Glucose-Capillary: 87 mg/dL (ref 70–99)

## 2014-06-07 MED ORDER — SINCALIDE 5 MCG IJ SOLR
INTRAMUSCULAR | Status: AC
  Filled 2014-06-07: qty 5

## 2014-06-07 MED ORDER — SINCALIDE 5 MCG IJ SOLR
0.0200 ug/kg | Freq: Once | INTRAMUSCULAR | Status: AC
  Administered 2014-06-07: 1.4 ug via INTRAVENOUS
  Filled 2014-06-07: qty 5

## 2014-06-07 MED ORDER — ICE PACK FOR EPOPROSTENOL (FLOLAN) INFUSION
1.0000 | Freq: Four times a day (QID) | Status: DC
  Administered 2014-06-07 – 2014-06-09 (×8): 1
  Filled 2014-06-07 (×13): qty 1

## 2014-06-07 MED ORDER — CALCIUM POLYCARBOPHIL 625 MG PO TABS
625.0000 mg | ORAL_TABLET | ORAL | Status: DC | PRN
  Administered 2014-06-08: 625 mg via ORAL
  Filled 2014-06-07: qty 1

## 2014-06-07 MED ORDER — TECHNETIUM TC 99M MEBROFENIN IV KIT
5.0000 | PACK | Freq: Once | INTRAVENOUS | Status: AC | PRN
  Administered 2014-06-07: 5 via INTRAVENOUS

## 2014-06-07 MED ORDER — TECHNETIUM TC 99M MEBROFENIN IV KIT: 5.0000 | PACK | Freq: Once | INTRAVENOUS | Status: AC | PRN

## 2014-06-07 MED ORDER — STERILE WATER FOR INJECTION IJ SOLN
INTRAMUSCULAR | Status: AC
  Filled 2014-06-07: qty 10

## 2014-06-07 MED ORDER — PANTOPRAZOLE SODIUM 40 MG PO TBEC
40.0000 mg | DELAYED_RELEASE_TABLET | Freq: Two times a day (BID) | ORAL | Status: DC
  Administered 2014-06-07 – 2014-06-08 (×3): 40 mg via ORAL
  Filled 2014-06-07 (×4): qty 1

## 2014-06-07 NOTE — Progress Notes (Signed)
Unassigned patient Subjective: Patient continues to have severe nausea and complains of feeling very weak, waitng to have her HIDA scan. Denies having abdominal pain. Has multiple family members at her bedside who claim she ahs been nauseated for 1 year now.  Objective: Vital signs in last 24 hours: Temp:  [98 F (36.7 C)-98.4 F (36.9 C)] 98 F (36.7 C) (08/17 0516) Pulse Rate:  [85-98] 98 (08/17 1036) Resp:  [20] 20 (08/17 0516) BP: (120-133)/(43-76) 133/76 mmHg (08/17 1036) SpO2:  [100 %] 100 % (08/17 0516) Weight:  [68.493 kg (151 lb)-68.783 kg (151 lb 10.2 oz)] 68.493 kg (151 lb) (08/17 1331) Last BM Date: 06/06/14  Intake/Output from previous day: 08/16 0701 - 08/17 0700 In: 216.3 [P.O.:120; I.V.:96.3] Out: 1 [Stool:1] Intake/Output this shift:   General appearance: cooperative, appears stated age, fatigued and moderate distress Resp: clear to auscultation bilaterally Cardio: regular rate and rhythm, S1, S2 normal, no murmur, click, rub or gallop GI: soft, non-tender; bowel sounds normal; no masses,  no organomegaly  Lab Results:  Recent Labs  06/05/14 0813 06/06/14 0550 06/07/14 0840  WBC 17.1* 22.7* 18.8*  HGB 12.0 12.6 11.9*  HCT 37.7 38.6 36.9  PLT 299 328 274   BMET  Recent Labs  06/05/14 0003 06/06/14 0550 06/07/14 0840  NA 138 134* 136*  K 3.4* 4.3 4.1  CL 96 95* 98  CO2 27 24 25   GLUCOSE 135* 172* 130*  BUN 13 22 25*  CREATININE 1.11* 1.46* 1.54*  CALCIUM 8.8 9.0 8.8   LFT  Recent Labs  06/07/14 0840  PROT 5.8*  ALBUMIN 3.0*  AST 29  ALT 23  ALKPHOS 83  BILITOT 0.7   PT/INR  Recent Labs  06/06/14 0550  LABPROT 14.0  INR 1.08   CLINICAL DATA: Vomiting.  EXAM:  CT ABDOMEN AND PELVIS WITH CONTRAST  TECHNIQUE:  Multidetector CT imaging of the abdomen and pelvis was performed  using the standard protocol following bolus administration of  intravenous contrast.  CONTRAST: 171mL OMNIPAQUE IOHEXOL 300 MG/ML SOLN  COMPARISON: CT  of the abdomen and pelvis 03/02/2013.  FINDINGS:  Lung Bases: Small chronic right-sided pleural effusion with chronic  pleural calcifications. Extensive scarring throughout the visualize  lung bases bilaterally, similar to the prior examination. Trace  chronic left pleural effusion. Central venous catheter tip in the  right atrium. Extensive atherosclerosis, including left anterior  descending and right coronary artery disease.  Abdomen/Pelvis: Nasogastric tube extends into the mid stomach.  Several sub cm low-attenuation lesions in the liver are too small to  characterize, but are unchanged compared to the prior examinations  in favored to represent benign lesions such as tiny cysts.  Previously noted microcystic mass in the head of the pancreas is  difficult to discretely visualize on today's examination (arterial  phase images were not obtained), but appears slightly larger than  the prior study from 03/02/2013, currently measuring approximately  2.3 x 4.2 cm. The appearance of the gallbladder, spleen and  bilateral adrenal glands is unremarkable. Interval enlargement of a  2.7 cm lesion in the upper pole of the right kidney which is  generally low-attenuation, however, there is what appears to be an  enhancing mural nodule along the posterior aspect of this lesion  (nodule measures approximately 10 mm), concerning for a cystic renal  cell neoplasm. In addition, there is soft tissue infiltration in the  region of the collecting systems and renal pelvises bilaterally,  which has progressively worsened over consecutive prior  examinations. This is most evident on the delayed images where there  is severe attenuation of the urinary contrast in the collecting  systems of the kidneys bilaterally.  Trace volume of ascites. No pneumoperitoneum. No pathologic  distention of small bowel. No lymphadenopathy noted in the abdomen  or pelvis. Extensive atherosclerosis throughout the abdominal and   pelvic vasculature, without evidence of aneurysm. Status post  hysterectomy. Ovaries are unremarkable in appearance. Urinary  bladder is normal in appearance. Specifically, no urothelial lesion  is identified in the urinary bladder.  Musculoskeletal: There are no aggressive appearing lytic or blastic  lesions noted in the visualized portions of the skeleton.  IMPRESSION:  1. No acute findings to account for the patient's history of  vomiting.  2. Previously demonstrated microcystic mass in the head of the  pancreas is only slightly larger than the prior examination, again  favored to represent a serous cystadenoma. Continued attention on  future follow-up imaging in 1 year is recommended.  3. Grossly abnormal appearance of the soft tissue in the collecting  systems of the kidneys and renal pelves bilaterally, which has  progressively worsened over prior examinations, concerning for  potential bilateral upper tract urothelial neoplasm. This may  alternatively be secondary to chronic inflammation, but nonemergent  urologic consultation for further workup and evaluation is strongly  recommended in the near future.  4. Additionally, there appears to be an enlarging cystic renal cell  neoplasm with an enhancing mural nodule in the upper pole of the  right kidney that measures approximately 2.7 cm. As stated above,  nonemergent urologic consultation is recommended.  5. Trace volume of ascites.  6. Small right and trace left chronic pleural effusions again noted.  7. Atherosclerosis, including at least 2 vessel coronary artery  disease. Please note that although the presence of coronary artery  calcium documents the presence of coronary artery disease, the  severity of this disease and any potential stenosis cannot be  assessed on this non-gated CT examination. Assessment for potential  risk factor modification, dietary therapy or pharmacologic therapy  may be warranted, if clinically  indicated.  8. Additional incidental findings, as above.  Electronically Signed  By: Vinnie Langton M.D.  On: 06/03/2014 23:06    Studies/Results: No results found.  Medications: I have reviewed the patient's current medications.  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  LOS: 4 days   Hikaru Delorenzo 06/07/2014, 2:31 PM

## 2014-06-07 NOTE — Clinical Documentation Improvement (Signed)
Chronic kidney disease documented in chart. Please clarify stage of CKD if known.   Labs as below.  Please clarify and document clinical significance of abnormal creatinine and GFR this hospitalization.    Component      BUN Creatinine  Latest Ref Rng      6 - 23 mg/dL 0.50 - 1.10 mg/dL  06/03/2014      12 1.00  06/04/2014      11 1.13 (H)  06/05/2014      13 1.11 (H)  06/06/2014      22 1.46 (H)   Component      GFR calc non Af Amer  Latest Ref Rng      >90 mL/min  06/03/2014      56 (L)  06/04/2014      48 (L)  06/05/2014      49 (L)  06/06/2014      36 (L)   Possible Clinical Condition: Acute Renal Failure Acute Kidney Injury Acute on Chronic Renal Failure (specify CKD stage) Other Condition Clinically Not Signicant   Thank You, Gatha Mayer ,RN Clinical Documentation Specialist:    [phone] [place] [facility]

## 2014-06-07 NOTE — Progress Notes (Signed)
Rapid response and IV team made aware that patient is on her home flolan drip per orders.

## 2014-06-07 NOTE — Progress Notes (Signed)
Advanced Heart Failure Rounding Note   Subjective:    Results of EGD noted = grade 2 esophagitis.   Remains nauseated and weak. Pending HIDA scan.  Denies CP or dyspnea. No further coffee grounds.   White count improving slightly. BCx from [facility] remain negative.   Objective:   Weight Range:  Vital Signs:   Temp:  [98 F (36.7 C)-98.6 F (37 C)] 98 F (36.7 C) (08/17 0516) Pulse Rate:  [85-103] 88 (08/17 0516) Resp:  [19-27] 20 (08/17 0516) BP: (120-198)/(43-79) 120/50 mmHg (08/17 0516) SpO2:  [100 %] 100 % (08/17 0516) Weight:  [68.783 kg (151 lb 10.2 oz)-69.9 kg (154 lb 1.6 oz)] 68.783 kg (151 lb 10.2 oz) (08/17 0516) Last BM Date: 06/06/14  Weight change: Filed Weights   06/06/14 0600 06/06/14 0954 06/07/14 0516  Weight: 70.1 kg (154 lb 8.7 oz) 69.9 kg (154 lb 1.6 oz) 68.783 kg (151 lb 10.2 oz)    Intake/Output:   Intake/Output Summary (Last 24 hours) at 06/07/14 0941 Last data filed at 06/07/14 0831  Gross per 24 hour  Intake    120 ml  Output      1 ml  Net    119 ml     Physical Exam:  General: Chronically-ill appearing. Lying flat in bed Nauseated. Wearing [place] O2  HEENT: normal  Neck: thick . JVP ~7 Carotids 2+ bilat; no bruits. No lymphadenopathy or thryomegaly appreciated.  Cor: PMI nonpalpable Regular tachy. 2/6 TR. P2 perhaps mildly accentuated but not severely so  Hickman catheter ok  Lungs: clear but decreased throughout no wheezing  Abdomen: obese. soft, nontender, nondistended. No hepatosplenomegaly. No bruits or masses. hypoactivends.  Extremities: no cyanosis, clubbing Neuro: alert & oriented x 3, cranial nerves grossly intact. moves all 4 extremities w/o difficulty. Affect pleasant.      Telemetry: SR with RBBB  Labs: Basic Metabolic Panel:  Recent Labs Lab 06/03/14 1750 06/04/14 0009 06/04/14 0420 06/05/14 0003 06/06/14 0550  NA 141  --  146 138 134*  K 2.5*  --  3.0* 3.4* 4.3  CL 91*  --  97 96 95*  CO2 32  --  36* 27 24   GLUCOSE 216*  --  99 135* 172*  BUN 12  --  11 13 22   CREATININE 1.00  --  1.13* 1.11* 1.46*  CALCIUM 10.1  --  9.2 8.8 9.0  MG  --  1.7  --   --   --   PHOS  --   --  4.2  --   --     Liver Function Tests:  Recent Labs Lab 06/03/14 1750 06/04/14 0420  AST 22 26  ALT 11 9  ALKPHOS 123* 98  BILITOT 0.6 0.5  PROT 8.0 6.5  ALBUMIN 4.0 3.3*    Recent Labs Lab 06/03/14 1750  LIPASE 9*   No results found for this basename: AMMONIA,  in the last 168 hours  CBC:  Recent Labs Lab 06/03/14 1750 06/04/14 0420  06/04/14 1705 06/05/14 0003 06/05/14 0813 06/06/14 0550 06/07/14 0840  WBC 21.3* 21.6*  < > 26.2* 22.5* 17.1* 22.7* 18.8*  NEUTROABS 19.7* 19.2*  --   --   --   --   --   --   HGB 12.0 10.8*  < > 12.5 13.1 12.0 12.6 11.9*  HCT 35.7* 33.4*  < > 38.7 40.1 37.7 38.6 36.9  MCV 85.6 87.4  < > 87.4 89.1 86.7 87.7 87.2  PLT 444* 400  < >  386 343 299 328 274  < > = values in this interval not displayed.  Cardiac Enzymes:  Recent Labs Lab 06/03/14 1750 06/03/14 2311 06/04/14 0420 06/04/14 2210  TROPONINI 0.33* 0.42* 3.55* 3.49*    BNP: BNP (last 3 results)  Recent Labs  02/23/14 1006  PROBNP 803.2*     Other results:  EKG:   Imaging: No results found.   Medications:     Scheduled Medications: . antiseptic oral rinse  7 mL Mouth Rinse q12n4p  . aspirin EC  81 mg Oral Daily  . atorvastatin  20 mg Oral q1800  . chlorhexidine  15 mL Mouth Rinse BID  . insulin aspart  0-9 Units Subcutaneous TID WC  . insulin glargine  12 Units Subcutaneous q morning - 10a  . levothyroxine  12.5 mcg Intravenous QAC breakfast  . metoprolol succinate  100 mg Oral Daily  . pantoprazole  40 mg Oral BID  . sucralfate  1 g Oral 4 times per day    Infusions: . epoprostenol 33.5 ng/kg/min (06/06/14 0800)    PRN Medications: acetaminophen, acetaminophen, alum & mag hydroxide-simeth, docusate sodium, hydrALAZINE, HYDROmorphone (DILAUDID) injection, LORazepam,  metoCLOPramide (REGLAN) injection, nitroGLYCERIN, ondansetron (ZOFRAN) IV, prochlorperazine   Assessment:   1. Hematemesis  2. NSTEMI  3. Right renal mass  4. Severe PAH on Flolan  5. CAD s/p CABG  6. DM2  7. Leukocytosis 8. hypokalemia 9. Acute kidney injury  Plan/Discussion:    Remains nauseated. No further bleeding. Pending HIDA scan. Leukocytosis improving slowly.  I am not sure what is causing her nausea. . I doubt that this is related to low cardiac output as LV and RV are normal on echo. No recent change in Flolan. Will d/w [facility] if they have any thoughts.  No further recs at this point. Will recheck troponin in am. I will follow at a distance.   Length of Stay: 4 Glori Bickers MD 06/07/2014, 9:41 AM  Advanced Heart Failure Team Pager [phone] (M-F; 7a - 4p)  Please contact [facility] for night-coverage after hours (4p -7a ) and weekends on [email or web address]

## 2014-06-07 NOTE — Progress Notes (Addendum)
[facility] PROGRESS NOTE  Meagan [name] UMP:[id number] DOB: 06/02/1946 DOA: 06/03/2014 PCP: Glo Herring., MD  Assessment/Plan: 1. Melena/Coffee ground emesis-resolved -EGD 8/16: with esophagitis and gastritis -change PPI to PO, advance diet after HIDA -Plavix on hold, resume this when ok with GI -Hb stable -no indication for transfusion -Per GI for HIDA scan due to persistent nausea and gall stones on USG  2. NSTEMI / CAD with severe 3 vessel dz  -no chest pain, likely due to demand, ECHO with preserved EF and normal PA pressures on FLolan -Cards following -continue Toprol, statin unable to use antiplatelet agents due to 1 -LHC 5/15 with several 3V disease but not easily revascularizable  3. Chronic resp failure -Severe Pulm HTN and Restrictive lung disease -on 6L Home O2 at baseline and Flolan gtt followed by [facility]  4. DM -continue lantus at half dose, SSI -stable  5. Hypokalemia -replace  6. Leukocytosis  -Remains afebrile and Blood and urine cultures negative -monitor for now, could be leukemoid reaction due to suspected RCC  7. Abnormal soft tissue in B/L renal collecting system and Renal cystic mass on CT as well, suspect RCC -URology eval appreciated, outpt FU -not candidate for invasive therapies  8. CHronic pain/fibromyalgia -increased narcotic dose  DVT prophylaxis - SCDs  Code Status: discussed Poor prognosis with spouse at length, he was tearful, finally agreeable to DNR now Family Communication: d/w spouse at bedside Disposition Plan: home in 1-2days   Consultants:  Cards  GI  HPI/Subjective: still with nausea but improved, chronic pain better No chest pain, no increased dyspnea, breathing at baseline  Objective: Filed Vitals:   06/07/14 0516  BP: 120/50  Pulse: 88  Temp: 98 F (36.7 C)  Resp: 20    Intake/Output Summary (Last 24 hours) at 06/07/14 0808 Last data filed at 06/06/14 1838  Gross per 24 hour  Intake 168.17 ml   Output      1 ml  Net 167.17 ml   Filed Weights   06/06/14 0600 06/06/14 0954 06/07/14 0516  Weight: 70.1 kg (154 lb 8.7 oz) 69.9 kg (154 lb 1.6 oz) 68.783 kg (151 lb 10.2 oz)    Exam:   General: AAOx2, sleeping, chronically ill  HEENT: flushing of face and upper chest  Cardiovascular: S1S2/RRR  Respiratory: scattered ronchi  Abdomen: soft, Nt, BS present  Musculoskeletal: no edema c/c   Data Reviewed: Basic Metabolic Panel:  Recent Labs Lab 06/03/14 1750 06/04/14 0009 06/04/14 0420 06/05/14 0003 06/06/14 0550  NA 141  --  146 138 134*  K 2.5*  --  3.0* 3.4* 4.3  CL 91*  --  97 96 95*  CO2 32  --  36* 27 24  GLUCOSE 216*  --  99 135* 172*  BUN 12  --  11 13 22   CREATININE 1.00  --  1.13* 1.11* 1.46*  CALCIUM 10.1  --  9.2 8.8 9.0  MG  --  1.7  --   --   --   PHOS  --   --  4.2  --   --    Liver Function Tests:  Recent Labs Lab 06/03/14 1750 06/04/14 0420  AST 22 26  ALT 11 9  ALKPHOS 123* 98  BILITOT 0.6 0.5  PROT 8.0 6.5  ALBUMIN 4.0 3.3*    Recent Labs Lab 06/03/14 1750  LIPASE 9*   No results found for this basename: AMMONIA,  in the last 168 hours CBC:  Recent Labs Lab 06/03/14 1750  06/04/14 0420 06/04/14 0940 06/04/14 1705 06/05/14 0003 06/05/14 0813 06/06/14 0550  WBC 21.3* 21.6* 22.6* 26.2* 22.5* 17.1* 22.7*  NEUTROABS 19.7* 19.2*  --   --   --   --   --   HGB 12.0 10.8* 10.6* 12.5 13.1 12.0 12.6  HCT 35.7* 33.4* 32.7* 38.7 40.1 37.7 38.6  MCV 85.6 87.4 86.7 87.4 89.1 86.7 87.7  PLT 444* 400 359 386 343 299 328   Cardiac Enzymes:  Recent Labs Lab 06/03/14 1750 06/03/14 2311 06/04/14 0420 06/04/14 2210  TROPONINI 0.33* 0.42* 3.55* 3.49*   BNP (last 3 results)  Recent Labs  02/23/14 1006  PROBNP 803.2*   CBG:  Recent Labs Lab 06/06/14 0829 06/06/14 1103 06/06/14 1610 06/06/14 2032 06/07/14 0522  GLUCAP 160* 169* 150* 206* 157*    Recent Results (from the past 240 hour(s))  CULTURE, BLOOD (ROUTINE X  2)     Status: None   Collection Time    06/03/14  8:17 PM      Result Value Ref Range Status   Specimen Description BLOOD LEFT WRIST   Final   Special Requests BOTTLES DRAWN AEROBIC ONLY 6CC   Final   Culture NO GROWTH 3 DAYS   Final   Report Status PENDING   Incomplete  CULTURE, BLOOD (ROUTINE X 2)     Status: None   Collection Time    06/03/14  8:23 PM      Result Value Ref Range Status   Specimen Description BLOOD LEFT ANTECUBITAL   Final   Special Requests BOTTLES DRAWN AEROBIC ONLY 6CC   Final   Culture NO GROWTH 3 DAYS   Final   Report Status PENDING   Incomplete  URINE CULTURE     Status: None   Collection Time    06/03/14  8:55 PM      Result Value Ref Range Status   Specimen Description URINE, CATHETERIZED   Final   Special Requests NONE   Final   Culture  Setup Time     Final   Value: 06/04/2014 13:57     Performed at [facility]     Final   Value: NO GROWTH     Performed at [facility]   Culture     Final   Value: NO GROWTH     Performed at [facility]   Report Status 06/05/2014 FINAL   Final  MRSA PCR SCREENING     Status: None   Collection Time    06/04/14 12:27 AM      Result Value Ref Range Status   MRSA by PCR NEGATIVE  NEGATIVE Final   Comment:            The GeneXpert MRSA Assay (FDA     approved for NASAL specimens     only), is one component of a     comprehensive MRSA colonization     surveillance program. It is not     intended to diagnose MRSA     infection nor to guide or     monitor treatment for     MRSA infections.     Studies: No results found.  Scheduled Meds: . antiseptic oral rinse  7 mL Mouth Rinse q12n4p  . aspirin EC  81 mg Oral Daily  . atorvastatin  20 mg Oral q1800  . chlorhexidine  15 mL Mouth Rinse BID  . insulin aspart  0-9 Units Subcutaneous TID WC  .  insulin glargine  12 Units Subcutaneous q morning - 10a  . levothyroxine  12.5 mcg Intravenous QAC breakfast  . metoprolol  succinate  100 mg Oral Daily  . pantoprazole (PROTONIX) IV  40 mg Intravenous Q12H  . sucralfate  1 g Oral 4 times per day   Continuous Infusions: . epoprostenol 33.5 ng/kg/min (06/06/14 0800)   Antibiotics Given (last 72 hours)   None      Active Problems:   Upper GI bleed   Fever   Elevated troponin   Hypokalemia   NSTEMI (non-ST elevated myocardial infarction)   Reflux esophagitis    Time spent: 60min    [facility] Pager [phone]. If 7PM-7AM, please contact night-coverage at [email or web address], password [facility] 06/07/2014, 8:08 AM  LOS: 4 days

## 2014-06-07 NOTE — Evaluation (Signed)
Physical Therapy Evaluation Patient Details Name: Meagan [name] MRN: [id number] DOB: 1946/09/11 Today's Date: 06/07/2014   History of Present Illness  Pt is a 68 year old F female with a PMH of diastolic CHF, CAD, CKD, pulmonary hypertension, diabetes type 2, restrictive lung disease with chronic home oxygen use of 6L/min. She also has chronic pain and is opioid dependent. She presents to the hospital with the onset of N/V with upper ground emesis and melena that started night PTA. This started shortly after eating dinner. Emesis started as stomach contents and progressed to coffee ground emesis. She was unable to tolerate oral food or liquid since last night and called EMS to transport the patient to [facility].  Clinical Impression  Pt admitted with the above. Pt currently with functional limitations due to the deficits listed below (see PT Problem List). At the time of PT eval pt was able to sit EOB and perform bed mobility. Session was limited by nausea. Pt will benefit from skilled PT to increase their independence and safety with mobility to allow discharge to the venue listed below. Pt and family stating that she was mobile PTA, however appears weakened since admission. If does not show functional improvement may want to consider STR in the SNF setting prior to returning home.       Follow Up Recommendations Home health PT;Supervision for mobility/OOB    Equipment Recommendations  None recommended by PT    Recommendations for Other Services       Precautions / Restrictions Precautions Precautions: Fall Restrictions Weight Bearing Restrictions: No      Mobility  Bed Mobility Overal bed mobility: Needs Assistance Bed Mobility: Supine to Sit;Sit to Supine     Supine to sit: Min assist Sit to supine: Min guard   General bed mobility comments: VC's for use of bed rails for support. Pt required assist to elevate trunk to full sitting position. Once supine at end of session, pt was  able to scoot herself to [facility] without assist.   Transfers                 General transfer comment: While sitting EOB, pt became nauseated again and requested to return to supine.   Ambulation/Gait                Stairs            Wheelchair Mobility    Modified Rankin (Stroke Patients Only)       Balance Overall balance assessment: Needs assistance Sitting-balance support: Feet supported;No upper extremity supported Sitting balance-Leahy Scale: Fair                                       Pertinent Vitals/Pain Pain Assessment: No/denies pain    Home Living Family/patient expects to be discharged to:: Private residence Living Arrangements: Spouse/significant other Available Help at Discharge: Family;Available 24 hours/day Type of Home: House Home Access: Stairs to enter Entrance Stairs-Rails: None Entrance Stairs-Number of Steps: 2 Home Layout: One level Home Equipment: Walker - 4 wheels;Cane - single point;Shower seat - built in      Prior Function Level of Independence: Independent         Comments: Husband assists with bathing due to pump for pulmonary hypertension. Pt has not driven in a year. Can go out in the community when she feels good. On bad days she stays at home in bed mostly.  Hand Dominance   Dominant Hand: Right    Extremity/Trunk Assessment   Upper Extremity Assessment: Defer to OT evaluation           Lower Extremity Assessment: Generalized weakness      Cervical / Trunk Assessment: Normal  Communication   Communication: HOH  Cognition Arousal/Alertness: Awake/alert Behavior During Therapy: WFL for tasks assessed/performed Overall Cognitive Status: Within Functional Limits for tasks assessed                      General Comments      Exercises        Assessment/Plan    PT Assessment Patient needs continued PT services  PT Diagnosis Difficulty walking;Generalized weakness    PT Problem List Decreased strength;Decreased range of motion;Decreased activity tolerance;Decreased balance;Decreased mobility;Decreased knowledge of use of DME;Decreased safety awareness;Decreased knowledge of precautions;Pain  PT Treatment Interventions DME instruction;Gait training;Stair training;Functional mobility training;Therapeutic activities;Therapeutic exercise;Neuromuscular re-education;Patient/family education   PT Goals (Current goals can be found in the Care Plan section) Acute Rehab PT Goals Patient Stated Goal: Return home PT Goal Formulation: With patient/family Time For Goal Achievement: 06/14/14 Potential to Achieve Goals: Fair    Frequency Min 3X/week   Barriers to discharge        Co-evaluation               End of Session Equipment Utilized During Treatment: Oxygen Activity Tolerance: Patient limited by fatigue;Treatment limited secondary to medical complications (Comment) (Nausea) Patient left: in bed;with call bell/phone within reach;with family/visitor present Nurse Communication: Mobility status         Time: 6294-7654 PT Time Calculation (min): 21 min   Charges:   PT Evaluation $Initial PT Evaluation Tier I: 1 Procedure PT Treatments $Therapeutic Activity: 8-22 mins   PT G CodesJolyn Lent 06/07/2014, 5:10 PM  Jolyn Lent, PT, DPT Acute Rehabilitation Services Pager: [phone]

## 2014-06-07 NOTE — Progress Notes (Signed)
UR completed Haroon Shatto K. [place]a Hedman, RN, BSN, [place], CCM  06/07/2014 4:58 PM

## 2014-06-07 NOTE — Progress Notes (Signed)
Date Additional Medicare IM given:  06/07/2014 Additional Medicare IM given by:  Nilam Quakenbush 

## 2014-06-08 ENCOUNTER — Encounter: Payer: Self-pay | Admitting: Internal Medicine

## 2014-06-08 DIAGNOSIS — E119 Type 2 diabetes mellitus without complications: Secondary | ICD-10-CM

## 2014-06-08 DIAGNOSIS — A0472 Enterocolitis due to Clostridium difficile, not specified as recurrent: Secondary | ICD-10-CM

## 2014-06-08 DIAGNOSIS — I214 Non-ST elevation (NSTEMI) myocardial infarction: Secondary | ICD-10-CM

## 2014-06-08 LAB — COMPREHENSIVE METABOLIC PANEL
ALK PHOS: 82 U/L (ref 39–117)
ALT: 20 U/L (ref 0–35)
AST: 23 U/L (ref 0–37)
Albumin: 2.8 g/dL — ABNORMAL LOW (ref 3.5–5.2)
Anion gap: 15 (ref 5–15)
BUN: 24 mg/dL — ABNORMAL HIGH (ref 6–23)
CO2: 23 meq/L (ref 19–32)
Calcium: 8.4 mg/dL (ref 8.4–10.5)
Chloride: 96 mEq/L (ref 96–112)
Creatinine, Ser: 1.43 mg/dL — ABNORMAL HIGH (ref 0.50–1.10)
GFR, EST AFRICAN AMERICAN: 42 mL/min — AB (ref >90)
GFR, EST NON AFRICAN AMERICAN: 36 mL/min — AB (ref >90)
GLUCOSE: 102 mg/dL — AB (ref 70–99)
POTASSIUM: 3.4 meq/L — AB (ref 3.7–5.3)
SODIUM: 134 meq/L — AB (ref 137–147)
Total Bilirubin: 0.5 mg/dL (ref 0.3–1.2)
Total Protein: 5.5 g/dL — ABNORMAL LOW (ref 6.0–8.3)

## 2014-06-08 LAB — GI PATHOGEN PANEL BY PCR, STOOL
C difficile toxin A/B: POSITIVE
Campylobacter by PCR: NEGATIVE
Cryptosporidium by PCR: NEGATIVE
E COLI (STEC): NEGATIVE
E COLI 0157 BY PCR: NEGATIVE
E coli (ETEC) LT/ST: NEGATIVE
G lamblia by PCR: NEGATIVE
NOROVIRUS G1/G2: NEGATIVE
Rotavirus A by PCR: NEGATIVE
Salmonella by PCR: NEGATIVE
Shigella by PCR: NEGATIVE

## 2014-06-08 LAB — GLUCOSE, CAPILLARY
GLUCOSE-CAPILLARY: 113 mg/dL — AB (ref 70–99)
GLUCOSE-CAPILLARY: 90 mg/dL (ref 70–99)
Glucose-Capillary: 151 mg/dL — ABNORMAL HIGH (ref 70–99)

## 2014-06-08 LAB — TROPONIN I: Troponin I: 0.41 ng/mL (ref <0.30)

## 2014-06-08 LAB — CBC
HCT: 33.9 % — ABNORMAL LOW (ref 36.0–46.0)
HEMOGLOBIN: 11.3 g/dL — AB (ref 12.0–15.0)
MCH: 28.5 pg (ref 26.0–34.0)
MCHC: 33.3 g/dL (ref 30.0–36.0)
MCV: 85.6 fL (ref 78.0–100.0)
Platelets: 292 10*3/uL (ref 150–400)
RBC: 3.96 MIL/uL (ref 3.87–5.11)
RDW: 13.3 % (ref 11.5–15.5)
WBC: 20.9 10*3/uL — ABNORMAL HIGH (ref 4.0–10.5)

## 2014-06-08 LAB — CULTURE, BLOOD (ROUTINE X 2)
Culture: NO GROWTH
Culture: NO GROWTH

## 2014-06-08 LAB — CLOSTRIDIUM DIFFICILE BY PCR: CDIFFPCR: POSITIVE — AB

## 2014-06-08 MED ORDER — METRONIDAZOLE 500 MG PO TABS
500.0000 mg | ORAL_TABLET | Freq: Three times a day (TID) | ORAL | Status: DC
Start: 2014-06-08 — End: 2014-06-09
  Administered 2014-06-08 – 2014-06-09 (×4): 500 mg via ORAL
  Filled 2014-06-08 (×6): qty 1

## 2014-06-08 MED ORDER — SODIUM CHLORIDE 0.9 % IJ SOLN
10.0000 mL | INTRAMUSCULAR | Status: DC | PRN
Start: 2014-06-08 — End: 2014-06-08

## 2014-06-08 MED ORDER — PANTOPRAZOLE SODIUM 40 MG PO TBEC: 40.0000 mg | DELAYED_RELEASE_TABLET | Freq: Two times a day (BID) | ORAL | Status: DC

## 2014-06-08 MED ORDER — INSULIN GLARGINE 100 UNIT/ML ~~LOC~~ SOLN: 20.0000 [IU] | Freq: Every morning | SUBCUTANEOUS | Status: DC

## 2014-06-08 MED ORDER — INSULIN ASPART 100 UNIT/ML ~~LOC~~ SOLN
0.0000 [IU] | Freq: Three times a day (TID) | SUBCUTANEOUS | Status: DC
  Administered 2014-06-09: 12:00:00 via SUBCUTANEOUS

## 2014-06-08 NOTE — Progress Notes (Addendum)
Subjective: "I'm hurtig all over.  You got to help me.  I want to go home."  Objective: Vital signs in last 24 hours: Temp:  [97.5 F (36.4 C)-98.8 F (37.1 C)] 98.8 F (37.1 C) (08/18 1030) Pulse Rate:  [85-93] 89 (08/18 1030) Resp:  [17-18] 17 (08/18 0546) BP: (110-125)/(44-50) 120/50 mmHg (08/18 1030) SpO2:  [100 %] 100 % (08/18 0546) Weight:  [155 lb 3.3 oz (70.4 kg)] 155 lb 3.3 oz (70.4 kg) (08/18 0546) Last BM Date: 06/07/04  Intake/Output from previous day: 08/17 0701 - 08/18 0700 In: 360 [P.O.:360] Out: -  Intake/Output this shift: Total I/O In: 180 [P.O.:180] Out: 400 [Urine:400]  General appearance: histronic GI: soft, non-tender; bowel sounds normal; no masses,  no organomegaly  Lab Results:  Recent Labs  06/06/14 0550 06/07/14 0840 06/08/14 0620  WBC 22.7* 18.8* 20.9*  HGB 12.6 11.9* 11.3*  HCT 38.6 36.9 33.9*  PLT 328 274 292   BMET  Recent Labs  06/06/14 0550 06/07/14 0840 06/08/14 0620  NA 134* 136* 134*  K 4.3 4.1 3.4*  CL 95* 98 96  CO2 24 25 23   GLUCOSE 172* 130* 102*  BUN 22 25* 24*  CREATININE 1.46* 1.54* 1.43*  CALCIUM 9.0 8.8 8.4   LFT  Recent Labs  06/08/14 0620  PROT 5.5*  ALBUMIN 2.8*  AST 23  ALT 20  ALKPHOS 82  BILITOT 0.5   PT/INR  Recent Labs  06/06/14 0550  LABPROT 14.0  INR 1.08   Hepatitis Panel No results found for this basename: HEPBSAG, HCVAB, HEPAIGM, HEPBIGM,  in the last 72 hours C-Diff No results found for this basename: CDIFFTOX,  in the last 72 hours Fecal Lactopherrin No results found for this basename: FECLLACTOFRN,  in the last 72 hours  Studies/Results: Nm Hepato W/eject Fract  06/07/2014   CLINICAL DATA:  Abdominal pain  EXAM: NUCLEAR MEDICINE HEPATOBILIARY IMAGING WITH GALLBLADDER EF  TECHNIQUE: Sequential images of the abdomen were obtained out to 60 minutes following intravenous administration of radiopharmaceutical. After slow intravenous infusion of 1.38 micrograms Cholecystokinin,  gallbladder ejection fraction was determined.  RADIOPHARMACEUTICALS:  5 Millic[employer]ie RS-85I Choletec  COMPARISON:  CT abdomen pelvis 06/03/2014  FINDINGS: Normal uptake by the liver. Normal biliary visualization. Gallbladder visualization began at 50 min. Small bowel activity is present. Gallbladder ejection fraction 44% at 30 min. At 30 min, normal ejection fraction is greater than 30%.  The patient did experience symptoms during CCK infusion.  IMPRESSION: Normal gallbladder ejection fraction   Electronically Signed   By: Franchot Gallo M.D.   On: 06/07/2014 15:36    Medications:  Scheduled: . antiseptic oral rinse  7 mL Mouth Rinse q12n4p  . aspirin EC  81 mg Oral Daily  . atorvastatin  20 mg Oral q1800  . chlorhexidine  15 mL Mouth Rinse BID  . ice pack  1 each Other Q6H  . insulin aspart  0-9 Units Subcutaneous TID WC  . insulin glargine  12 Units Subcutaneous q morning - 10a  . levothyroxine  12.5 mcg Intravenous QAC breakfast  . metoprolol succinate  100 mg Oral Daily  . metroNIDAZOLE  500 mg Oral 3 times per day  . pantoprazole  40 mg Oral BID  . sucralfate  1 g Oral 4 times per day   Continuous: . epoprostenol 33.5 ng/kg/min (06/06/14 0800)    Assessment/Plan: 1) Chronic nausea. 2) Epigastric bloating. 3) Mild esophagitis. 4) Leukocytosis secondary to C. Diff.   Certainly the esophagitis noted  on the recent EGD can explain her symptoms, but she does not appear to be responsive.  There is a marked elevation in her WBC and the source is not clear.  No evidence of any fevers.  Her HIDA was negative and she did NOT have reproduction or worsening of her symptoms with the CCK injection.  A gastric emptying scan was performed by her GI physician Dr. Gala Romney at [facility], which was normal.  She is histrionic with her symptoms.  In one instance she will be on the very of tears and begging for help, but then she will revert back to a calm state.  There is a combination of organic and  psychological issues.     Plan: 1) No role for Reglan.  I will D/C. 2) Continue with PPI and Zofran.  LOS: 5 days   Meagan [name] D 06/08/2014, 3:42 PM

## 2014-06-08 NOTE — Progress Notes (Signed)
CRITICAL VALUE ALERT  Critical value received:  c-diff positive  Date of notification:  06/08/14  Time of notification:  5993  Critical value read back:Yes.    Nurse who received alert:  Waynetta Sandy, RN  MD notified (1st page):  Dr. Broadus John  Time of first page:  14  MD notified (2nd page):  Time of second page:  Responding MD:    Time MD responded:

## 2014-06-08 NOTE — Progress Notes (Signed)
Patient requested not to be given anymore protonix because she feels it is making her stomach worse.

## 2014-06-08 NOTE — Progress Notes (Addendum)
[facility] PROGRESS NOTE  Meagan [name] YQI:[id number] DOB: November 17, 1945 DOA: 06/03/2014 PCP: Glo Herring., MD Brief Narrative 68 year old female with a history of diastolic CHF, Severe 3vessal CAD, Severe pulm HTN on Flolan pump, chronic kidney disease, diabetes type 2, restrictive lung disease with chronic home oxygen use of 6 L per minute, chronic pain opioid dependent, presented to the hospital with the onset of nausea and vomiting with upper ground emesis and melena. Then found to have NSTEMI, being followed by Cardiology and GI. NSTEMI on medical management, in terms of melena-resolved, Hb stable, EGD with gastritis only. Also found to have Renal mass suspected RCC on CT abd, seen by Urology. Overnight 8/17-18 with Diarrhea, today 8/18 Cdiff PCR positive   Assessment/Plan: 1. Melena/Coffee ground emesis-resolved -EGD 8/16: with esophagitis and gastritis -changed PPI to PO, tolerating diet -Plavix on hold, resume this when ok with GI -Hb stable -no indication for transfusion -HIDA scan done by GI for persistent nausea and gall stones on USG, unremarkable  2. NSTEMI / CAD with severe 3 vessel dz  -no chest pain, likely due to demand, ECHO with preserved EF and normal PA pressures on FLolan pump -Cards following -continue Toprol, statin unable to use antiplatelet agents due to 1 -LHC 5/15 with several 3V disease but not easily revascularizable  3. Chronic resp failure -Severe Pulm HTN and Restrictive lung disease -on 6L Home O2 at baseline and Flolan gtt followed by [facility]  4. DM -continue lantus at half dose, SSI -stable  5. Cdiff colitis -diarrhea overnight 8/17-18, today Cdiff PCR positive, -Start PO flagyl  6. Leukocytosis  -Remains afebrile and Blood and urine cultures negative -likely due to Cdiff which is positive today  7. Abnormal soft tissue in B/L renal collecting system and Renal cystic mass on CT as well, suspect RCC -URology eval appreciated,  outpt FU -not candidate for invasive therapies  8. CHronic pain/fibromyalgia -increased narcotic dose  DVT prophylaxis - SCDs  Code Status: discussed Poor prognosis with spouse at length 8/15, he was tearful, finally agreeable to DNR now Family Communication: d/w spouse at bedside Disposition Plan: home in 1-2days   Consultants:  Cards  GI  HPI/Subjective: Nausea better, Diarrhea overnight 5-6 times, received immodium x1  Objective: Filed Vitals:   06/08/14 1030  BP: 120/50  Pulse: 89  Temp: 98.8 F (37.1 C)  Resp:     Intake/Output Summary (Last 24 hours) at 06/08/14 1448 Last data filed at 06/08/14 1320  Gross per 24 hour  Intake    540 ml  Output    400 ml  Net    140 ml   Filed Weights   06/07/14 0516 06/07/14 1331 06/08/14 0546  Weight: 68.783 kg (151 lb 10.2 oz) 68.493 kg (151 lb) 70.4 kg (155 lb 3.3 oz)    Exam:   General: AAOx2, chronically ill appearing  HEENT: flushing of face and upper chest  Cardiovascular: S1S2/RRR  Respiratory: scattered ronchi  Abdomen: soft, Nt, BS present  Musculoskeletal: no edema c/c   Data Reviewed: Basic Metabolic Panel:  Recent Labs Lab 06/04/14 0009 06/04/14 0420 06/05/14 0003 06/06/14 0550 06/07/14 0840 06/08/14 0620  NA  --  146 138 134* 136* 134*  K  --  3.0* 3.4* 4.3 4.1 3.4*  CL  --  97 96 95* 98 96  CO2  --  36* 27 24 25 23   GLUCOSE  --  99 135* 172* 130* 102*  BUN  --  11 13 22  25* 24*  CREATININE  --  1.13* 1.11* 1.46* 1.54* 1.43*  CALCIUM  --  9.2 8.8 9.0 8.8 8.4  MG 1.7  --   --   --   --   --   PHOS  --  4.2  --   --   --   --    Liver Function Tests:  Recent Labs Lab 06/03/14 1750 06/04/14 0420 06/07/14 0840 06/08/14 0620  AST 22 26 29 23   ALT 11 9 23 20   ALKPHOS 123* 98 83 82  BILITOT 0.6 0.5 0.7 0.5  PROT 8.0 6.5 5.8* 5.5*  ALBUMIN 4.0 3.3* 3.0* 2.8*    Recent Labs Lab 06/03/14 1750  LIPASE 9*   No results found for this basename: AMMONIA,  in the last 168  hours CBC:  Recent Labs Lab 06/03/14 1750 06/04/14 0420  06/05/14 0003 06/05/14 0813 06/06/14 0550 06/07/14 0840 06/08/14 0620  WBC 21.3* 21.6*  < > 22.5* 17.1* 22.7* 18.8* 20.9*  NEUTROABS 19.7* 19.2*  --   --   --   --   --   --   HGB 12.0 10.8*  < > 13.1 12.0 12.6 11.9* 11.3*  HCT 35.7* 33.4*  < > 40.1 37.7 38.6 36.9 33.9*  MCV 85.6 87.4  < > 89.1 86.7 87.7 87.2 85.6  PLT 444* 400  < > 343 299 328 274 292  < > = values in this interval not displayed. Cardiac Enzymes:  Recent Labs Lab 06/03/14 1750 06/03/14 2311 06/04/14 0420 06/04/14 2210 06/08/14 0620  TROPONINI 0.33* 0.42* 3.55* 3.49* 0.41*   BNP (last 3 results)  Recent Labs  02/23/14 1006  PROBNP 803.2*   CBG:  Recent Labs Lab 06/07/14 0522 06/07/14 1122 06/07/14 1637 06/07/14 2135 06/08/14 1132  GLUCAP 157* 144* 214* 87 151*    Recent Results (from the past 240 hour(s))  CULTURE, BLOOD (ROUTINE X 2)     Status: None   Collection Time    06/03/14  8:17 PM      Result Value Ref Range Status   Specimen Description BLOOD LEFT WRIST   Final   Special Requests BOTTLES DRAWN AEROBIC ONLY [place]   Final   Culture NO GROWTH 5 DAYS   Final   Report Status 06/08/2014 FINAL   Final  CULTURE, BLOOD (ROUTINE X 2)     Status: None   Collection Time    06/03/14  8:23 PM      Result Value Ref Range Status   Specimen Description BLOOD LEFT ANTECUBITAL   Final   Special Requests BOTTLES DRAWN AEROBIC ONLY [place]   Final   Culture NO GROWTH 5 DAYS   Final   Report Status 06/08/2014 FINAL   Final  URINE CULTURE     Status: None   Collection Time    06/03/14  8:55 PM      Result Value Ref Range Status   Specimen Description URINE, CATHETERIZED   Final   Special Requests NONE   Final   Culture  Setup Time     Final   Value: 06/04/2014 13:57     Performed at [facility]     Final   Value: NO GROWTH     Performed at [facility]   Culture     Final   Value: NO GROWTH      Performed at [facility]   Report Status 06/05/2014 FINAL   Final  MRSA PCR SCREENING  Status: None   Collection Time    06/04/14 12:27 AM      Result Value Ref Range Status   MRSA by PCR NEGATIVE  NEGATIVE Final   Comment:            The GeneXpert MRSA Assay (FDA     approved for NASAL specimens     only), is one component of a     comprehensive MRSA colonization     surveillance program. It is not     intended to diagnose MRSA     infection nor to guide or     monitor treatment for     MRSA infections.  CLOSTRIDIUM DIFFICILE BY PCR     Status: Abnormal   Collection Time    06/08/14 10:16 AM      Result Value Ref Range Status   C difficile by pcr POSITIVE (*) NEGATIVE Final   Comment: CRITICAL RESULT CALLED TO, READ BACK BY AND VERIFIED WITH:     L. MUELLER RN 12:00 06/08/14 ([email or web address])     Studies: Nm Hepato W/eject Fract  06/07/2014   CLINICAL DATA:  Abdominal pain  EXAM: NUCLEAR MEDICINE HEPATOBILIARY IMAGING WITH GALLBLADDER EF  TECHNIQUE: Sequential images of the abdomen were obtained out to 60 minutes following intravenous administration of radiopharmaceutical. After slow intravenous infusion of 1.38 micrograms Cholecystokinin, gallbladder ejection fraction was determined.  RADIOPHARMACEUTICALS:  5 [place] VP-71G Choletec  COMPARISON:  CT abdomen pelvis 06/03/2014  FINDINGS: Normal uptake by the liver. Normal biliary visualization. Gallbladder visualization began at 50 min. Small bowel activity is present. Gallbladder ejection fraction 44% at 30 min. At 30 min, normal ejection fraction is greater than 30%.  The patient did experience symptoms during CCK infusion.  IMPRESSION: Normal gallbladder ejection fraction   Electronically Signed   By: Franchot Gallo M.D.   On: 06/07/2014 15:36    Scheduled Meds: . antiseptic oral rinse  7 mL Mouth Rinse q12n4p  . aspirin EC  81 mg Oral Daily  . atorvastatin  20 mg Oral q1800  . chlorhexidine  15 mL Mouth Rinse BID  . ice  pack  1 each Other Q6H  . insulin aspart  0-9 Units Subcutaneous TID WC  . insulin glargine  12 Units Subcutaneous q morning - 10a  . levothyroxine  12.5 mcg Intravenous QAC breakfast  . metoprolol succinate  100 mg Oral Daily  . metroNIDAZOLE  500 mg Oral 3 times per day  . pantoprazole  40 mg Oral BID  . sucralfate  1 g Oral 4 times per day   Continuous Infusions: . epoprostenol 33.5 ng/kg/min (06/06/14 0800)   Antibiotics Given (last 72 hours)   Date/Time Action Medication Dose   06/08/14 1326 Given   metroNIDAZOLE (FLAGYL) tablet 500 mg 500 mg      Active Problems:   Upper GI bleed   Fever   Elevated troponin   Hypokalemia   NSTEMI (non-ST elevated myocardial infarction)   Reflux esophagitis    Time spent: 35min    [facility] Pager [phone]. If 7PM-7AM, please contact night-coverage at [email or web address], password [facility] 06/08/2014, 2:48 PM  LOS: 5 days

## 2014-06-08 NOTE — Progress Notes (Signed)
PT Cancellation Note  Patient Details Name: Meagan [name] MRN: [id number] DOB: Aug 28, 1946   Cancelled Treatment:    Reason Eval/Treat Not Completed: Patient unavailable at this time with other staff members in the room for dressing changes. Will reattempt as schedule allows.    Jolyn Lent 06/08/2014, 4:12 PM  Jolyn Lent, PT, DPT Acute Rehabilitation Services Pager: [phone]

## 2014-06-09 LAB — BASIC METABOLIC PANEL
Anion gap: 13 (ref 5–15)
BUN: 22 mg/dL (ref 6–23)
CHLORIDE: 93 meq/L — AB (ref 96–112)
CO2: 23 mEq/L (ref 19–32)
CREATININE: 1.48 mg/dL — AB (ref 0.50–1.10)
Calcium: 8.4 mg/dL (ref 8.4–10.5)
GFR calc non Af Amer: 35 mL/min — ABNORMAL LOW (ref >90)
GFR, EST AFRICAN AMERICAN: 41 mL/min — AB (ref >90)
Glucose, Bld: 84 mg/dL (ref 70–99)
Potassium: 3.2 mEq/L — ABNORMAL LOW (ref 3.7–5.3)
Sodium: 129 mEq/L — ABNORMAL LOW (ref 137–147)

## 2014-06-09 LAB — CBC
HEMATOCRIT: 33 % — AB (ref 36.0–46.0)
Hemoglobin: 11.2 g/dL — ABNORMAL LOW (ref 12.0–15.0)
MCH: 28.8 pg (ref 26.0–34.0)
MCHC: 33.9 g/dL (ref 30.0–36.0)
MCV: 84.8 fL (ref 78.0–100.0)
PLATELETS: 269 10*3/uL (ref 150–400)
RBC: 3.89 MIL/uL (ref 3.87–5.11)
RDW: 13.1 % (ref 11.5–15.5)
WBC: 18.2 10*3/uL — AB (ref 4.0–10.5)

## 2014-06-09 LAB — GLUCOSE, CAPILLARY
GLUCOSE-CAPILLARY: 122 mg/dL — AB (ref 70–99)
Glucose-Capillary: 88 mg/dL (ref 70–99)

## 2014-06-09 LAB — MAGNESIUM: Magnesium: 1.8 mg/dL (ref 1.5–2.5)

## 2014-06-09 MED ORDER — LEVOTHYROXINE SODIUM 100 MCG IV SOLR
12.5000 ug | Freq: Every day | INTRAVENOUS | Status: DC
  Administered 2014-06-09: 12.5 ug via INTRAVENOUS
  Filled 2014-06-09 (×2): qty 5

## 2014-06-09 MED ORDER — ICE PACK FOR EPOPROSTENOL (FLOLAN) INFUSION: 1.0000 | Freq: Four times a day (QID) | Status: DC

## 2014-06-09 MED ORDER — LEVOTHYROXINE SODIUM 25 MCG PO TABS
25.0000 ug | ORAL_TABLET | Freq: Every day | ORAL | Status: DC
  Filled 2014-06-09: qty 1

## 2014-06-09 MED ORDER — POTASSIUM CHLORIDE CRYS ER 20 MEQ PO TBCR
40.0000 meq | EXTENDED_RELEASE_TABLET | Freq: Once | ORAL | Status: AC
  Administered 2014-06-09: 40 meq via ORAL
  Filled 2014-06-09: qty 2

## 2014-06-09 MED ORDER — ASPIRIN 81 MG PO TBEC: 81.0000 mg | DELAYED_RELEASE_TABLET | Freq: Every day | ORAL | Status: DC

## 2014-06-09 MED ORDER — INSULIN GLARGINE 100 UNIT/ML ~~LOC~~ SOLN: 12.0000 [IU] | Freq: Every morning | SUBCUTANEOUS | Status: DC

## 2014-06-09 MED ORDER — METRONIDAZOLE 500 MG PO TABS: 500.0000 mg | ORAL_TABLET | Freq: Three times a day (TID) | ORAL | Status: DC

## 2014-06-09 NOTE — Progress Notes (Signed)
Physical Therapy Treatment Patient Details Name: Meagan [name] MRN: [id number] DOB: Aug 26, 1946 Today's Date: 06/09/2014    History of Present Illness Pt is a 68 year old F female with a PMH of diastolic CHF, CAD, CKD, pulmonary hypertension, diabetes type 2, restrictive lung disease with chronic home oxygen use of 6L/min. She also has chronic pain and is opioid dependent. She presents to the hospital with the onset of N/V with upper ground emesis and melena that started night PTA. This started shortly after eating dinner. Emesis started as stomach contents and progressed to coffee ground emesis. She was unable to tolerate oral food or liquid since last night and called EMS to transport the patient to [facility].    PT Comments    Pt progressing towards physical therapy goals. Discussed the importance and benefits of participating with PT. Pt able to ambulate around the room with 6L/min supplemental O2 support. Sats remained stable >90%. No LOB noted. Pt declined practicing the steps during session, stating that she feels confident ascending 2 steps with husband's assistance to enter the home. Will continue to follow and progress as able per POC.   Follow Up Recommendations  Home health PT;Supervision for mobility/OOB     Equipment Recommendations  None recommended by PT    Recommendations for Other Services       Precautions / Restrictions Precautions Precautions: Fall Precaution Comments: Pt very hard of hearing and often requires repeated VC's. Severe pulm HTN on Flolan pump. Restrictions Weight Bearing Restrictions: No    Mobility  Bed Mobility Overal bed mobility: Needs Assistance Bed Mobility: Supine to Sit;Sit to Supine     Supine to sit: Min guard Sit to supine: Min assist   General bed mobility comments: VC's for use of bed rails for support. Cued husband to allow pt to be as independent as able. Min guard for trunk elevation to full sitting position.   Transfers Overall  transfer level: Needs assistance Equipment used: Rolling walker (2 wheeled) Transfers: Sit to/from Stand Sit to Stand: Min guard         General transfer comment: VC's for hand placement on seated surface for safety. Min guard assist for safety as pt powered-up to full standing.   Ambulation/Gait Ambulation/Gait assistance: Min guard Ambulation Distance (Feet): 40 Feet Assistive device: Rolling walker (2 wheeled) Gait Pattern/deviations: Decreased stride length;Shuffle;Trunk flexed Gait velocity: Decreased Gait velocity interpretation: Below normal speed for age/gender General Gait Details: Pt able to ambulate 2 laps in room. Pt moving slowly but was generally safe with RW. Recommended to pt and husband that she has something to guard her at home when she is OOB.    Stairs            Wheelchair Mobility    Modified Rankin (Stroke Patients Only)       Balance Overall balance assessment: Needs assistance Sitting-balance support: Feet supported;Single extremity supported Sitting balance-Leahy Scale: Fair     Standing balance support: Bilateral upper extremity supported Standing balance-Leahy Scale: Poor Standing balance comment: Feel that pt requires UE support during standing activity to maintain balance.                     Cognition Arousal/Alertness: Awake/alert Behavior During Therapy: WFL for tasks assessed/performed Overall Cognitive Status: Within Functional Limits for tasks assessed                      Exercises      General Comments  Pertinent Vitals/Pain Pain Assessment: No/denies pain    Home Living                      Prior Function            PT Goals (current goals can now be found in the care plan section) Acute Rehab PT Goals Patient Stated Goal: Return home TODAY. PT Goal Formulation: With patient/family Time For Goal Achievement: 06/14/14 Potential to Achieve Goals: Fair Progress towards PT goals:  Progressing toward goals    Frequency  Min 3X/week    PT Plan Current plan remains appropriate    Co-evaluation             End of Session Equipment Utilized During Treatment: Gait belt;Oxygen Activity Tolerance: Patient limited by fatigue Patient left: in bed;with call bell/phone within reach;with family/visitor present     Time: 3893-7342 PT Time Calculation (min): 22 min  Charges:  $Therapeutic Activity: 8-22 mins                    G Codes:      Jolyn Lent June 15, 2014, 1:58 PM  Jolyn Lent, PT, DPT Acute Rehabilitation Services Pager: [phone]

## 2014-06-09 NOTE — Discharge Summary (Signed)
Physician Discharge Summary  Meagan [name] YJE:[id number] DOB: [birth year] DOA: 06/03/2014  PCP: Glo Herring., MD Cardiology: Dr. Glori Bickers.  Admit date: 06/03/2014 Discharge date: 06/09/2014  Time spent: Greater than 30 minutes.  Recommendations for Outpatient Follow-up:  1. Dr. Redmond School, PCP in 5 days with repeat labs (CBC & BMP). Consider ENT consultation as OP for evaluation of hearing deficits b/l. 2. Dr. Glori Bickers, Cardiology in 2 weeks 3. Dr. Alexis Frock, Urology in 1 month. 4. Dr. Rise Paganini, GI in 2 weeks. 5. HH PT.  Discharge Diagnoses:  Active Problems:   Upper GI bleed   Fever   Elevated troponin   Hypokalemia   NSTEMI (non-ST elevated myocardial infarction)   Reflux esophagitis   C. difficile diarrhea   Discharge Condition: Improved & Stable  Diet recommendation: Diabetic & Heart Healthy diet.  Filed Weights   06/07/14 0516 06/07/14 1331 06/08/14 0546  Weight: 68.783 kg (151 lb 10.2 oz) 68.493 kg (151 lb) 70.4 kg (155 lb 3.3 oz)    History of present illness:  68 year old female patient with history of chronic diastolic CHF, CAD s/p CABG & RCA stent, CKD, severe PAH on Flolan- managed at [place], DM 2, restrictive lung disease, chronic respiratory failure on home oxygen 6 L per minute continuously, fibromyalgia/chronic pain, hypothyroid, GERD, anxiety, pancreatic mass-followed by outpatient GI, presented to [facility] with coffee-ground emesis.  Hospital Course:   1. Upper GI bleed/intractable Nausea & vomiting/esophagitis: Patient was initially evaluated at [facility]. She ruled in for MI and hence was transferred to [facility] for further evaluation by cardiology. She was placed on IV Protonix drip, Zofran and Reglan. Plavix and Aleve were held. NG tube had been placed. GI consulted. After discussing with cardiology, GI performed EGD which showed grade 2 reflux esophagitis secondary to vomiting and mild  antral gastritis. GI stated that her hematemesis was related to reflux esophagitis and vomiting and hematemesis has resolved. Due to her ongoing nausea, abdominal ultrasound was done which showed multiple gallstones. This was followed by HIDA scan which was negative. Gastric emptying scan done by her primary gastroenterologist as outpatient was normal. GI follow up on 8/18 suggested that her GI symptoms were combination of organic and psychological issues. Reglan was discontinued. Her nausea may be related to Flagyl as well but currently seems to be tolerating. If she continues to have issues with nausea, may consider changing Flagyl to oral vancomycin for C. difficile treatment. 2. Elevated troponin- ? NSTEMI/CAD: Cardiology consulted. LHC 5/15 with severe three-vessel disease but not easily revascularizable. No chest pain. Cardiology was consulted and stated that in the absence of chest pain, doubt NSTEMI. Echo showed preserved LVEF, RV looked okay and PA pressures appeared normal on Flolan. Cardiology has cleared patient for discharge and recommend low dose aspirin. Plavix discontinued early on in admission. 3. Hypokalemia: Replaced prior to DC. 4. Type II DM: Good inpatient control. Treated with reduced dose of Lantus and SSI. Continue reduced dose of Lantus until outpatient followup with PCP and can be titrated as needed. 5. Low-grade fever and leukocytosis: Fevers resolved. Leukocytosis improving. Urine and blood cultures negative. Likely secondary to C. difficile colitis. Treat C. difficile. 6. C. difficile colitis: Complete 14 days of Flagyl. Discussed with GI/Dr. Benson Norway who recommends continuing PPI for problem #1. Improving 7. Chronic respiratory failure on home oxygen/severe pulmonary hypertension/restrictive lung disease: Continue oxygen and outpatient followup with [facility]. 8. Severe PAH: Stable. Continue Flolan 9. Right parenchymal renal mass: Incidental  2.7 cm enhancing cystic mass upper pole right  kidney by CT 06/03/14 on evaluation of GI bleed. Urology consulted. Mass present since at least 2012 and seen 2014. As per urology, likely slow-growing renal neoplasm such as cystic renal cell carcinoma. Urology discussed at length with patient and family regarding management options including surveillance and definitive primary therapy for which she is not a candidate for at present and unlikely to be given severe cardiopulmonary disease. Urology will arrange outpatient followup. 10. Bilateral renal pelvis thickening: Urology consulted. Differential diagnosis chronic inflammation, pyelonephritis, urothelial malignancy. Again urology indicated that she's not a candidate for invasive workup at this point. They plan to followup as outpatient. 11. Chronic cystitis: Urine culture negative. 12. History of chronic pain/fibromyalgia: Controlled pain 13. Decreased hearing b/L: Patient and family declined further evaluation in the hospital i.e. otoscopy or ENT consultation. Advised outpatient followup with PCP and ENT consultation as deemed necessary. They verbalized understanding. 14. Anemia: Stable. Follow CBCs in a couple of days as outpatient. 15. Hyponatremia: Clinically appears euvolemic.? SIADH. Follow BMP in a couple of days as outpatient. 16. Chronic diastolic CHF: Continue home diuretics.  Consultations:  Gastroenterology  Urology  Cardiology  Procedures:  EGD 8/16: ENDOSCOPIC IMPRESSION:  1. grade 2 reflux esophagitis secondary to vomiting  2. mild antral gastritis. Status post biopsies to rule out H. pylori  Hematemesis was slightly related to reflux esophagitis and vomiting  and seems to be resolving at this point  RECOMMENDATIONS:  1. Await pathology results  2. Anti-reflux regimen to be follow  3. Upper abdominal ultrasound revealed multiple small gallstones,  she has had intermittent elevation of liver function tests,  Possibility that current episode of abdominal pain was caused  by  biliary colic  We'll proceed with the hepatobiliary scan  Advance diet  REPEAT EXAM: for EGD pending biopsy results.    Discharge Exam:  Complaints:  Patient insisting on going home. States that she has had no further nausea or vomiting and is able to tolerate diet. Reports 2 BMs in the last 24 hours with stools more formed. Denies pain. Denies dyspnea or chest pain. Complains of some decreased hearing in both ears which she attributes to sinusitis.  Filed Vitals:   06/08/14 0546 06/08/14 1030 06/08/14 2203 06/09/14 0416  BP: 110/44 120/50 104/51 139/63  Pulse: 93 89 93 98  Temp: 98.8 F (37.1 C) 98.8 F (37.1 C) 98.3 F (36.8 C) 98.2 F (36.8 C)  TempSrc: Oral Oral Oral Oral  Resp: 17  18 22   Height:      Weight: 70.4 kg (155 lb 3.3 oz)     SpO2: 100%  100% 100%    General exam: Pleasant middle-aged female, appears slightly anxious, propped up in bed without distress. Respiratory system: Diminished breath sounds in the bases but otherwise clear to auscultation. No increased work of breathing. Cardiovascular system: S1 & S2 heard, RRR. No JVD, murmurs, gallops, clicks or pedal edema. Gastrointestinal system: Abdomen is nondistended, soft and nontender. Normal bowel sounds heard. Central nervous system: Alert and oriented. No focal neurological deficits. Extremities: Symmetric 5 x 5 power.  Discharge Instructions      Discharge Instructions   Call MD for:  persistant nausea and vomiting    Complete by:  As directed      Call MD for:  severe uncontrolled pain    Complete by:  As directed      Call MD for:    Complete by:  As directed  Vomiting black material or blood.     Diet - low sodium heart healthy    Complete by:  As directed      Diet Carb Modified    Complete by:  As directed      Increase activity slowly    Complete by:  As directed             Medication List    STOP taking these medications       ALEVE 220 MG tablet  Generic drug:  naproxen  sodium     clopidogrel 75 MG tablet  Commonly known as:  PLAVIX      TAKE these medications       ALPRAZolam 0.5 MG tablet  Commonly known as:  XANAX  Take 1 mg by mouth at bedtime. For sleep. *Prescribed one tablet three times daily as needed*     aspirin 81 MG EC tablet  Take 1 tablet (81 mg total) by mouth daily.     DULoxetine 60 MG capsule  Commonly known as:  CYMBALTA  Take 60 mg by mouth every morning.     ezetimibe-simvastatin 10-40 MG per tablet  Commonly known as:  VYTORIN  Take 1 tablet by mouth every evening.     FLOLAN IV  Inject into the vein. 1.5 mg. Inject into the vein continuously. Infuse mg/kg/min via cont IV pump.     FLORAJEN3 PO  Take 1 tablet by mouth daily.     furosemide 40 MG tablet  Commonly known as:  LASIX  Take 80 mg by mouth daily.     HYDROmorphone 2 MG tablet  Commonly known as:  DILAUDID  Take 4 mg by mouth 2 (two) times daily.     insulin glargine 100 UNIT/ML injection  Commonly known as:  LANTUS  Inject 0.12 mLs (12 Units total) into the skin every morning.     isosorbide mononitrate 60 MG 24 hr tablet  Commonly known as:  IMDUR  Take 90 mg by mouth every morning.     levothyroxine 25 MCG tablet  Commonly known as:  SYNTHROID, LEVOTHROID  Take 25 mcg by mouth every morning.     metoprolol succinate 100 MG 24 hr tablet  Commonly known as:  TOPROL-XL  Take 100 mg by mouth every morning.     metroNIDAZOLE 500 MG tablet  Commonly known as:  FLAGYL  Take 1 tablet (500 mg total) by mouth every 8 (eight) hours.     Nasal Spray 0.05 % Soln  Place 1 spray into the nose as needed (for congestion).     nitroGLYCERIN 0.4 MG SL tablet  Commonly known as:  NITROSTAT  Place 0.4 mg under the tongue every 5 (five) minutes as needed for chest pain.     omeprazole 20 MG capsule  Commonly known as:  PRILOSEC  Take 20 mg by mouth 2 (two) times daily.     ondansetron 8 MG tablet  Commonly known as:  ZOFRAN  Take 8 mg by mouth 2 (two)  times daily. For nausea     Oxygen Permeable Lens [employer] Soln  Inhale into the lungs continuous. 6 liters     potassium chloride SA 20 MEQ tablet  Commonly known as:  K-DUR,KLOR-CON  Take 40 mEq by mouth daily.     traMADol 50 MG tablet  Commonly known as:  ULTRAM  Take 50 mg by mouth every 3 (three) hours as needed. For pain. *takes with Dilaudid (Hydromorphone)  Follow-up Information   Follow up with Glo Herring., MD. Schedule an appointment as soon as possible for a visit in 5 days. (to be seen with repeat labs (CBC & BMP).)    Specialty:  Internal Medicine   Contact information:   [address] [place] [address] [phone]       Follow up with Glori Bickers, MD. Schedule an appointment as soon as possible for a visit in 2 weeks. (MD's office will call with appointment. Call them if you dont hear back in next 2-3 days.)    Specialty:  Cardiology   Contact information:   [place] [address] [phone]       Follow up with Alexis Frock, MD. Schedule an appointment as soon as possible for a visit in 1 month.   Specialty:  Urology   Contact information:   [place] [address] [phone]       Follow up with [facility]. (Registered Nurse and Physical Therapy Services to start within 24-48 hours of discharge)    Contact information:   [address] [place] [address] [phone]       Follow up with Manus Rudd, MD. Schedule an appointment as soon as possible for a visit in 2 weeks.   Specialty:  Gastroenterology   Contact information:   [place] [address] [place] [address] [phone]        The results of significant diagnostics from this hospitalization (including imaging, microbiology, ancillary and laboratory) are listed below for reference.    Significant Diagnostic Studies: Ct Abdomen Pelvis W  Contrast  06/03/2014   CLINICAL DATA:  Vomiting.  EXAM: CT ABDOMEN AND PELVIS WITH CONTRAST  TECHNIQUE: Multidetector CT imaging of the abdomen and pelvis was performed using the standard protocol following bolus administration of intravenous contrast.  CONTRAST:  151mL OMNIPAQUE IOHEXOL 300 MG/ML  SOLN  COMPARISON:  CT of the abdomen and pelvis 03/02/2013.  FINDINGS: Lung Bases: Small chronic right-sided pleural effusion with chronic pleural calcifications. Extensive scarring throughout the visualize lung bases bilaterally, similar to the prior examination. Trace chronic left pleural effusion. Central venous catheter tip in the right atrium. Extensive atherosclerosis, including left anterior descending and right coronary artery disease.  Abdomen/Pelvis: Nasogastric tube extends into the mid stomach. Several sub cm low-attenuation lesions in the liver are too small to characterize, but are unchanged compared to the prior examinations in favored to represent benign lesions such as tiny cysts. Previously noted microcystic mass in the head of the pancreas is difficult to discretely visualize on today's examination (arterial phase images were not obtained), but appears slightly larger than the prior study from 03/02/2013, currently measuring approximately 2.3 x 4.2 cm. The appearance of the gallbladder, spleen and bilateral adrenal glands is unremarkable. Interval enlargement of a 2.7 cm lesion in the upper pole of the right kidney which is generally low-attenuation, however, there is what appears to be an enhancing mural nodule along the posterior aspect of this lesion (nodule measures approximately 10 mm), concerning for a cystic renal cell neoplasm. In addition, there is soft tissue infiltration in the region of the collecting systems and renal pelvises bilaterally, which has progressively worsened over consecutive prior examinations. This is most evident on the delayed images where there is severe attenuation of  the urinary contrast in the collecting systems of the kidneys bilaterally.  Trace volume of ascites. No pneumoperitoneum. No pathologic distention  of small bowel. No lymphadenopathy noted in the abdomen or pelvis. Extensive atherosclerosis throughout the abdominal and pelvic vasculature, without evidence of aneurysm. Status post hysterectomy. Ovaries are unremarkable in appearance. Urinary bladder is normal in appearance. Specifically, no urothelial lesion is identified in the urinary bladder.  Musculoskeletal: There are no aggressive appearing lytic or blastic lesions noted in the visualized portions of the skeleton.  IMPRESSION: 1. No acute findings to account for the patient's history of vomiting. 2. 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 Continued attention on future follow-up imaging in 1 year is recommended. 3. Grossly abnormal appearance of the soft tissue in the collecting systems of the kidneys and renal pelves bilaterally, which has progressively worsened over prior examinations, concerning for potential bilateral upper tract urothelial neoplasm. This may alternatively be secondary to chronic inflammation, but nonemergent urologic consultation for further workup and evaluation is strongly recommended in the near future. 4. Additionally, there appears to be an enlarging cystic renal cell neoplasm with an enhancing mural nodule in the upper pole of the right kidney that measures approximately 2.7 cm. As stated above, nonemergent urologic consultation is recommended. 5. Trace volume of ascites. 6. Small right and trace left chronic pleural effusions again noted. 7. Atherosclerosis, including at least 2 vessel coronary artery disease. Please note that although the presence of coronary artery calcium documents the presence of coronary artery disease, the severity of this disease and  any potential stenosis cannot be assessed on this non-gated CT examination. Assessment for potential risk factor modification, dietary therapy or pharmacologic therapy may be warranted, if clinically indicated. 8. Additional incidental findings, as above.   Electronically Signed   By: Vinnie Langton M.D.   On: 06/03/2014 23:06   Nm Hepato W/eject Fract  06/07/2014   CLINICAL DATA:  Abdominal pain  EXAM: NUCLEAR MEDICINE HEPATOBILIARY IMAGING WITH GALLBLADDER EF  TECHNIQUE: Sequential images of the abdomen were obtained out to 60 minutes following intravenous administration of radiopharmaceutical. After slow intravenous infusion of 1.38 micrograms Cholecystokinin, gallbladder ejection fraction was determined.  RADIOPHARMACEUTICALS:  5 [employer] YH-06C Choletec  COMPARISON:  CT abdomen pelvis 06/03/2014  FINDINGS: Normal uptake by the liver. Normal biliary visualization. Gallbladder visualization began at 50 min. Small bowel activity is present. Gallbladder ejection fraction 44% at 30 min. At 30 min, normal ejection fraction is greater than 30%.  The patient did experience symptoms during CCK infusion.  IMPRESSION: Normal gallbladder ejection fraction   Electronically Signed   By: Franchot Gallo M.D.   On: 06/07/2014 15:36   Dg Chest Port 1v Same Day  06/04/2014   CLINICAL DATA:  Cough, leukocytosis.  EXAM: PORTABLE CHEST - 1 VIEW SAME DAY  COMPARISON:  06/03/2014.  FINDINGS: Trachea is midline. Heart size stable. Nasogastric tube has been removed. Right IJ central line tip projects over the SVC RA junction. Mild diffuse interstitial prominence and indistinctness without focal airspace consolidation. No definite pleural fluid. Right hemidiaphragm is elevated.  IMPRESSION: Mild diffuse interstitial prominence and indistinctness. Pulmonary edema is favored.   Electronically Signed   By: Lorin Picket M.D.   On: 06/04/2014 19:33   Dg Abd Acute W/chest  06/03/2014   CLINICAL DATA:  Abdominal pain and  vomiting.  EXAM: ACUTE ABDOMEN SERIES (ABDOMEN 2 VIEW & CHEST 1 VIEW)  COMPARISON:  02/23/2014 chest x-ray.  FINDINGS: The right IJ catheter is stable. There is an NG tube  tip is in the body region of the stomach. The heart is mildly enlarged but stable. There is tortuosity and calcification of the thoracic aorta. No acute pulmonary findings.  The abdominal bowel gas pattern demonstrates a paucity of bowel gas. This can occasionally be seen with a small bowel obstruction an fluid-filled loops of small bowel. I do not see any obvious stool or air in the colon. The soft tissue shadows are maintained. No worrisome calcifications. No definite free air. The bony structures are intact.  IMPRESSION: Paucity of abdominal bowel gas. This can occasionally be seen with small bowel obstructions with fluid-filled loops of small bowel.  No free air.  No significant pulmonary findings.   Electronically Signed   By: Kalman Jewels M.D.   On: 06/03/2014 19:31    Microbiology: Recent Results (from the past 240 hour(s))  CULTURE, BLOOD (ROUTINE X 2)     Status: None   Collection Time    06/03/14  8:17 PM      Result Value Ref Range Status   Specimen Description BLOOD LEFT WRIST   Final   Special Requests BOTTLES DRAWN AEROBIC ONLY [place]   Final   Culture NO GROWTH 5 DAYS   Final   Report Status 06/08/2014 FINAL   Final  CULTURE, BLOOD (ROUTINE X 2)     Status: None   Collection Time    06/03/14  8:23 PM      Result Value Ref Range Status   Specimen Description BLOOD LEFT ANTECUBITAL   Final   Special Requests BOTTLES DRAWN AEROBIC ONLY [place]   Final   Culture NO GROWTH 5 DAYS   Final   Report Status 06/08/2014 FINAL   Final  URINE CULTURE     Status: None   Collection Time    06/03/14  8:55 PM      Result Value Ref Range Status   Specimen Description URINE, CATHETERIZED   Final   Special Requests NONE   Final   Culture  Setup Time     Final   Value: 06/04/2014 13:57     Performed at [facility]     Final   Value: NO GROWTH     Performed at [facility]   Culture     Final   Value: NO GROWTH     Performed at [facility]   Report Status 06/05/2014 FINAL   Final  MRSA PCR SCREENING     Status: None   Collection Time    06/04/14 12:27 AM      Result Value Ref Range Status   MRSA by PCR NEGATIVE  NEGATIVE Final   Comment:            The GeneXpert MRSA Assay (FDA     approved for NASAL specimens     only), is one component of a     comprehensive MRSA colonization     surveillance program. It is not     intended to diagnose MRSA     infection nor to guide or     monitor treatment for     MRSA infections.  CLOSTRIDIUM DIFFICILE BY PCR     Status: Abnormal   Collection Time    06/08/14 10:16 AM      Result Value Ref Range Status   C difficile by pcr POSITIVE (*) NEGATIVE Final   Comment: CRITICAL RESULT CALLED TO, READ BACK BY AND VERIFIED WITH:  L. MUELLER RN 12:00 06/08/14 ([email or web address])     Labs: Basic Metabolic Panel:  Recent Labs Lab 06/04/14 0009 06/04/14 0420 06/05/14 0003 06/06/14 0550 06/07/14 0840 06/08/14 0620 06/09/14 0401  NA  --  146 138 134* 136* 134* 129*  K  --  3.0* 3.4* 4.3 4.1 3.4* 3.2*  CL  --  97 96 95* 98 96 93*  CO2  --  36* 27 24 25 23 23   GLUCOSE  --  99 135* 172* 130* 102* 84  BUN  --  11 13 22  25* 24* 22  CREATININE  --  1.13* 1.11* 1.46* 1.54* 1.43* 1.48*  CALCIUM  --  9.2 8.8 9.0 8.8 8.4 8.4  MG 1.7  --   --   --   --   --  1.8  PHOS  --  4.2  --   --   --   --   --    Liver Function Tests:  Recent Labs Lab 06/03/14 1750 06/04/14 0420 06/07/14 0840 06/08/14 0620  AST 22 26 29 23   ALT 11 9 23 20   ALKPHOS 123* 98 83 82  BILITOT 0.6 0.5 0.7 0.5  PROT 8.0 6.5 5.8* 5.5*  ALBUMIN 4.0 3.3* 3.0* 2.8*    Recent Labs Lab 06/03/14 1750  LIPASE 9*   No results found for this basename: AMMONIA,  in the last 168 hours CBC:  Recent Labs Lab 06/03/14 1750 06/04/14 0420  06/05/14 0813  06/06/14 0550 06/07/14 0840 06/08/14 0620 06/09/14 0401  WBC 21.3* 21.6*  < > 17.1* 22.7* 18.8* 20.9* 18.2*  NEUTROABS 19.7* 19.2*  --   --   --   --   --   --   HGB 12.0 10.8*  < > 12.0 12.6 11.9* 11.3* 11.2*  HCT 35.7* 33.4*  < > 37.7 38.6 36.9 33.9* 33.0*  MCV 85.6 87.4  < > 86.7 87.7 87.2 85.6 84.8  PLT 444* 400  < > 299 328 274 292 269  < > = values in this interval not displayed. Cardiac Enzymes:  Recent Labs Lab 06/03/14 1750 06/03/14 2311 06/04/14 0420 06/04/14 2210 06/08/14 0620  TROPONINI 0.33* 0.42* 3.55* 3.49* 0.41*   BNP: BNP (last 3 results)  Recent Labs  02/23/14 1006  PROBNP 803.2*   CBG:  Recent Labs Lab 06/08/14 1132 06/08/14 1648 06/08/14 2158 06/09/14 0632 06/09/14 1112  GLUCAP 151* 113* 90 88 122*      Signed:  HONGALGI,ANAND, MD, FACP, FHM. [facility] Pager [phone]  If 7PM-7AM, please contact night-coverage [email or web address] Password [email or web address] 06/09/2014, 1:27 PM

## 2014-06-23 ENCOUNTER — Encounter (HOSPITAL_COMMUNITY): Payer: Medicare Other

## 2014-07-02 ENCOUNTER — Other Ambulatory Visit: Payer: Self-pay | Admitting: Internal Medicine

## 2014-07-09 ENCOUNTER — Ambulatory Visit: Payer: Medicare Other | Admitting: Gastroenterology

## 2014-07-19 ENCOUNTER — Ambulatory Visit (HOSPITAL_COMMUNITY)
Admission: RE | Admit: 2014-07-19 | Discharge: 2014-07-19 | Disposition: A | Discharge status: Home / Self Care | Payer: Medicare Other | Source: Ambulatory Visit | Attending: Cardiology | Admitting: Cardiology

## 2014-07-19 ENCOUNTER — Encounter (HOSPITAL_COMMUNITY): Payer: Self-pay

## 2014-07-19 VITALS — BP 112/54 | HR 90 | Wt 150.0 lb

## 2014-07-19 DIAGNOSIS — I2510 Atherosclerotic heart disease of native coronary artery without angina pectoris: Secondary | ICD-10-CM

## 2014-07-19 DIAGNOSIS — Z7982 Long term (current) use of aspirin: Secondary | ICD-10-CM | POA: Insufficient documentation

## 2014-07-19 DIAGNOSIS — Z7902 Long term (current) use of antithrombotics/antiplatelets: Secondary | ICD-10-CM | POA: Diagnosis not present

## 2014-07-19 DIAGNOSIS — K2971 Gastritis, unspecified, with bleeding: Secondary | ICD-10-CM | POA: Insufficient documentation

## 2014-07-19 DIAGNOSIS — I5032 Chronic diastolic (congestive) heart failure: Secondary | ICD-10-CM | POA: Diagnosis not present

## 2014-07-19 DIAGNOSIS — I10 Essential (primary) hypertension: Secondary | ICD-10-CM | POA: Diagnosis not present

## 2014-07-19 DIAGNOSIS — Z8614 Personal history of Methicillin resistant Staphylococcus aureus infection: Secondary | ICD-10-CM | POA: Diagnosis not present

## 2014-07-19 DIAGNOSIS — K228 Other specified diseases of esophagus: Secondary | ICD-10-CM | POA: Diagnosis not present

## 2014-07-19 DIAGNOSIS — K2991 Gastroduodenitis, unspecified, with bleeding: Secondary | ICD-10-CM | POA: Diagnosis not present

## 2014-07-19 DIAGNOSIS — I5030 Unspecified diastolic (congestive) heart failure: Secondary | ICD-10-CM | POA: Diagnosis present

## 2014-07-19 DIAGNOSIS — K219 Gastro-esophageal reflux disease without esophagitis: Secondary | ICD-10-CM | POA: Insufficient documentation

## 2014-07-19 DIAGNOSIS — E119 Type 2 diabetes mellitus without complications: Secondary | ICD-10-CM | POA: Diagnosis not present

## 2014-07-19 DIAGNOSIS — I2789 Other specified pulmonary heart diseases: Secondary | ICD-10-CM

## 2014-07-19 DIAGNOSIS — K2289 Other specified disease of esophagus: Secondary | ICD-10-CM | POA: Insufficient documentation

## 2014-07-19 DIAGNOSIS — Z951 Presence of aortocoronary bypass graft: Secondary | ICD-10-CM | POA: Insufficient documentation

## 2014-07-19 DIAGNOSIS — Z79899 Other long term (current) drug therapy: Secondary | ICD-10-CM | POA: Diagnosis not present

## 2014-07-19 DIAGNOSIS — Z794 Long term (current) use of insulin: Secondary | ICD-10-CM | POA: Insufficient documentation

## 2014-07-19 DIAGNOSIS — K209 Esophagitis, unspecified without bleeding: Secondary | ICD-10-CM | POA: Insufficient documentation

## 2014-07-19 NOTE — Patient Instructions (Signed)
Your physician recommends that you schedule a follow-up appointment in: 3 months.  

## 2014-07-20 NOTE — Progress Notes (Signed)
Patient ID: MALIKA DEMARIO, female   DOB: [birth year], 68 y.o.   MRN: [id number] GI: Dr Gala Romney Oncologist: Dr Tressie Stalker Cardiologsit: Dr Gilles Chiquito- [facility] IV Flolan PCP: Dr Gerarda Fraction  HPI: Ms. Michna is a 68 y/o woman with multiple medical problems including CAD s/p CABG and RCA stent, DM2, obesity, fibromyalgia, diastolic HF. She has severe PAH and is on Flolan followed by Dr. Gilles Chiquito at [facility].   Admitted to [facility] 02/22/14 with jaw pain and dyspnea. Had RHC/LHC which showed decreased PVR and stable coronaries. Diuresed with IV lasix. Also treated for possible pneumonia. Discharge weight was 150 pounds.   She was admitted again in 8/15 with upper GI bleeding as well as C difficile diarrhea.  She had no chest pain but mildly elevated troponin suggestive of demand ischemia.  She had an EGD showing esophagitis and gastritis.  Plavix was held initially but she is back on it.  No further melena or BRBPR. Abdominal US showed gallstones in the gallbladder.    She returns for follow up. She has minimal jaw pain at this time.  Breathing back to baseline. Weight down 4 lbs on our scale. No episodes of presyncope or syncope.  Compliant with medications. Ambulates in clinic with rolling walker.  Seen at [facility] last week for Flolan followup with no changes.    CT chest 10/10: No PE or ILD PFTs 10/10: Restrictive lung disease FEV1 1.01L (56%) FVC 1.2 (48%) DLCO 24% Rheum work-up which was negative. Sleep study 11/10: negative for OSA  RHC/LHC 01/21/13  RA = 11  RV = 82/0/15  PA = 78/18 (39)  PCW = 19  Fick cardiac output/index = 6.1/3.6  PVR = 3.3 Woods  FA sat = 93%  PA sat = 62%,64%  Ao Pressure: 106/39 (65)  LV Pressure: 124/5/16 Left main: Heavily calcified. Distal 40-50%  LAD: Diffuse severe disease. Totalled in midsection. Mid to distal vessel fills from LIMA. 2 diagonals with diffuse disease. There is a 70-80% lesion in the midsection of the 2nd diagonal  LCX: Small heavily diseased vessel. 30-40% ostial. Long  95% lesion in mid AV groove after take-off of OM-1. Distal vessel small and diffusely diseased. OM-1 mild plaque. OM-2 and OM-3 occluded  RCA: Ostial spasm. Long stent in midsection is patent. Distal RCA 40% before PDA. Mild to moderate plaque in PD and PLs.  LV-gram done in the RAO projection: Ejection fraction = 60-65% No wall motion abnormalities  LIMA-LAD: widely patent with 50-60% lesion in LAD after insertion of LIMA  SVG-OM: totally occluded proximally (chronic)   RHC/LHC 02/23/14  RA = 9  RV = 67/5/10  PA = 71/21 (39)  PCW = 13  Fick cardiac output/index = 5.5/3.1  PVR = 4.6 Woods  FA sat = 94%  PA sat = 63%,64%  Ao Pressure: 177/76 (119)  LV Pressure: 169/9/20  Left main: Heavily calcified. Distal 40-50%  LAD: Diffuse severe disease. Totaled in midsection. Mid to distal vessel fills from LIMA. 2 diagonals with diffuse disease. There is a 70-80% lesion in the midsection of the 2nd diagonal  LCX: Small heavily diseased vessel. 30-40% ostial. Long 95% lesion in mid AV groove after take-off of OM-1. Distal vessel small and diffusely diseased. OM-1 mild plaque. OM-2 and OM-3 occluded  RCA: Ostial spasm. Long stent in midsection is patent. Distal RCA 40% before PDA. In Mid PDA50% lesion. Mild to moderate plaque in PLs. R to L collaterals filling portion of LCX and septals  LIMA-LAD: widely patent with 70-75%  eccentric lesion in LAD after insertion of LIMA. The apical LAD is occluded.  SVG-OM: totally occluded proximally (chronic)   Echo 06/30/13: EF 60% RV moderately dilated. Evidence cor pulmonale RVSP 54  Labs (8/15): K 3.2, creatinine 1.48, HCT 33  ROS: All systems negative except as listed in HPI, PMH and Problem List.  Past Medical History  Diagnosis Date  . Fibromyalgia   . Hypercholesterolemia   . Hypertension     pulmonary  pap 110/31 (mean 62)by RHC 9/10 negative PE by chest CT 2010  . Pulmonary edema     Failed Revatio   . ARF (acute renal failure)     poor candidate  for ACE -I or ARB  . Restrictive lung disease     obesity hypoventilation syndrome  . Renal insufficiency   . CAD (coronary artery disease)     multivessel DES x 2 RCA 2007  . Chronic diastolic heart failure   . MRSA (methicillin resistant staphylococcus aureus) pneumonia   . Pulmonary hypertension   . Chronic anemia     anemia of chronic disease based on anemia panel 2011  . Pancreatic mass 08/01/2012  . PONV (postoperative nausea and vomiting)   . Heart murmur     "when I was small"  . CHF (congestive heart failure)   . Anginal pain   . Pneumonia X 3  . Type II diabetes mellitus   . Hypothyroidism   . H/O hiatal hernia   . GERD (gastroesophageal reflux disease)   . Anxiety   . On home oxygen therapy     "6L all the time" (02/24/2014    Current Outpatient Prescriptions  Medication Sig Dispense Refill  . ALPRAZolam (XANAX) 0.5 MG tablet Take 1 mg by mouth at bedtime. For sleep. *Prescribed one tablet three times daily as needed*      . aspirin EC 81 MG EC tablet Take 1 tablet (81 mg total) by mouth daily.  30 tablet  0  . DULoxetine (CYMBALTA) 30 MG capsule Take 30 mg by mouth daily.      . DULoxetine (CYMBALTA) 60 MG capsule Take 60 mg by mouth every morning.       Marland Kitchen Epoprostenol Sodium (FLOLAN IV) Inject into the vein. 1.5 mg. Inject into the vein continuously. Infuse mg/kg/min via cont IV pump.      Marland Kitchen ezetimibe-simvastatin (VYTORIN) 10-40 MG per tablet Take 1 tablet by mouth every evening.      . furosemide (LASIX) 40 MG tablet Take 80 mg by mouth daily.       Marland Kitchen HYDROmorphone (DILAUDID) 2 MG tablet Take 4 mg by mouth 2 (two) times daily.       . insulin glargine (LANTUS) 100 UNIT/ML injection Inject 0.12 mLs (12 Units total) into the skin every morning.      . isosorbide mononitrate (IMDUR) 60 MG 24 hr tablet Take 90 mg by mouth every morning.       Marland Kitchen levothyroxine (SYNTHROID, LEVOTHROID) 25 MCG tablet Take 25 mcg by mouth every morning.       . metoprolol (TOPROL-XL) 100 MG  24 hr tablet Take 100 mg by mouth every morning.       . nitroGLYCERIN (NITROSTAT) 0.4 MG SL tablet Place 0.4 mg under the tongue every 5 (five) minutes as needed for chest pain.       Marland Kitchen omeprazole (PRILOSEC) 20 MG capsule Take 20 mg by mouth 2 (two) times daily.        . ondansetron (ZOFRAN)  8 MG tablet Take 8 mg by mouth 2 (two) times daily. For nausea      . Oxygen Permeable Lens Products SOLN Inhale into the lungs continuous. 6 liters      . Oxymetazoline HCl (NASAL SPRAY) 0.05 % SOLN Place 1 spray into the nose as needed (for congestion).      . potassium chloride SA (K-DUR,KLOR-CON) 20 MEQ tablet Take 40 mEq by mouth daily.       . Probiotic Product (FLORAJEN3 PO) Take 1 tablet by mouth daily.      . traMADol (ULTRAM) 50 MG tablet Take 50 mg by mouth every 3 (three) hours as needed. For pain. *takes with Dilaudid (Hydromorphone)      . VYTORIN 10-40 MG per tablet TAKE ONE TABLET BY MOUTH AT BEDTIME  90 tablet  3  . [DISCONTINUED] cetirizine (ZYRTEC) 10 MG tablet Take 10 mg by mouth daily.         No current facility-administered medications for this encounter.    Filed Vitals:   07/19/14 1529  BP: 112/54  Pulse: 90  Weight: 150 lb (68.04 kg)  SpO2: 91%   PHYSICAL EXAM: General: No distress. Wearing 6L [place] O2 husband and sister present  HEENT: normal  Neck: thick . JVP 6-7 Carotids 2+ bilat; no bruits. No lymphadenopathy or thryomegaly appreciated.  Cor: PMI nonpalpable Regular rate & rhythm. 2/6 TR. P2 perhaps mildly accentuated but not severely so  Hickman catheter ok Lungs: clear but decreased throughout no wheezing on 6 liters [place]  Abdomen: obese. soft, nontender, nondistended. No hepatosplenomegaly. No bruits or masses. Good bowel sounds.  Extremities: no cyanosis, clubbing, macular-papular erythematous rash on LEs, no edema  Neuro: alert & oriented x 3, cranial nerves grossly intact. moves all 4 extremities w/o difficulty. Affect pleasant.    ASSESSMENT/PLAN:   1. PAH: On  Flolan through [place].  Jaw pain improved.  Functional status stable, class IIIb.  2. CAD: s/p CABG.  LAD lesion on last cath does not appear to be flow limiting. No chest pain.  - Continue ASA 81 daily and Vytorin.  - She is back on Plavix after GI bleeding episode.  She had gastritis/esophagitis on EGD and is on PPI. Can continue for now but if any further bleeding would stop.  3. Chronic diastolic HF: Primarily RV failure in setting of PAH.  Continue Lasix 80 mg daily. She had BMET last week at [facility], will call for copy of labs.  4. Upper GI bleed: From esophagitis/gastritis.  As above, was started back on Plavix.  Can continue for now since she is on PPI, but with more bleeding would stop Plavix.   Loralie Champagne 07/20/2014

## 2014-07-21 ENCOUNTER — Ambulatory Visit: Payer: Medicare Other | Admitting: Gastroenterology

## 2014-07-27 ENCOUNTER — Ambulatory Visit: Payer: Medicare Other | Admitting: Urology

## 2014-07-27 ENCOUNTER — Inpatient Hospital Stay (HOSPITAL_COMMUNITY)
Admission: EM | Admit: 2014-07-27 | Discharge: 2014-08-03 | DRG: 469 | Disposition: A | Discharge status: Rehabilitation Facility | Payer: Medicare Other | Attending: Internal Medicine | Admitting: Internal Medicine

## 2014-07-27 ENCOUNTER — Emergency Department (HOSPITAL_COMMUNITY): Payer: Medicare Other

## 2014-07-27 ENCOUNTER — Encounter (HOSPITAL_COMMUNITY): Payer: Self-pay | Admitting: Emergency Medicine

## 2014-07-27 DIAGNOSIS — E785 Hyperlipidemia, unspecified: Secondary | ICD-10-CM | POA: Diagnosis present

## 2014-07-27 DIAGNOSIS — F329 Major depressive disorder, single episode, unspecified: Secondary | ICD-10-CM | POA: Diagnosis present

## 2014-07-27 DIAGNOSIS — Z825 Family history of asthma and other chronic lower respiratory diseases: Secondary | ICD-10-CM | POA: Diagnosis not present

## 2014-07-27 DIAGNOSIS — I2721 Secondary pulmonary arterial hypertension: Secondary | ICD-10-CM

## 2014-07-27 DIAGNOSIS — S72001A Fracture of unspecified part of neck of right femur, initial encounter for closed fracture: Secondary | ICD-10-CM | POA: Diagnosis present

## 2014-07-27 DIAGNOSIS — E873 Alkalosis: Secondary | ICD-10-CM | POA: Diagnosis present

## 2014-07-27 DIAGNOSIS — E876 Hypokalemia: Secondary | ICD-10-CM

## 2014-07-27 DIAGNOSIS — Z794 Long term (current) use of insulin: Secondary | ICD-10-CM

## 2014-07-27 DIAGNOSIS — E662 Morbid (severe) obesity with alveolar hypoventilation: Secondary | ICD-10-CM | POA: Diagnosis present

## 2014-07-27 DIAGNOSIS — I129 Hypertensive chronic kidney disease with stage 1 through stage 4 chronic kidney disease, or unspecified chronic kidney disease: Secondary | ICD-10-CM | POA: Diagnosis present

## 2014-07-27 DIAGNOSIS — F419 Anxiety disorder, unspecified: Secondary | ICD-10-CM | POA: Diagnosis present

## 2014-07-27 DIAGNOSIS — I5033 Acute on chronic diastolic (congestive) heart failure: Secondary | ICD-10-CM | POA: Diagnosis present

## 2014-07-27 DIAGNOSIS — I272 Other secondary pulmonary hypertension: Secondary | ICD-10-CM | POA: Diagnosis present

## 2014-07-27 DIAGNOSIS — Z955 Presence of coronary angioplasty implant and graft: Secondary | ICD-10-CM | POA: Diagnosis not present

## 2014-07-27 DIAGNOSIS — W010XXA Fall on same level from slipping, tripping and stumbling without subsequent striking against object, initial encounter: Secondary | ICD-10-CM | POA: Diagnosis present

## 2014-07-27 DIAGNOSIS — Z951 Presence of aortocoronary bypass graft: Secondary | ICD-10-CM

## 2014-07-27 DIAGNOSIS — I2510 Atherosclerotic heart disease of native coronary artery without angina pectoris: Secondary | ICD-10-CM | POA: Diagnosis present

## 2014-07-27 DIAGNOSIS — E1165 Type 2 diabetes mellitus with hyperglycemia: Secondary | ICD-10-CM

## 2014-07-27 DIAGNOSIS — Z6828 Body mass index (BMI) 28.0-28.9, adult: Secondary | ICD-10-CM

## 2014-07-27 DIAGNOSIS — E1142 Type 2 diabetes mellitus with diabetic polyneuropathy: Secondary | ICD-10-CM | POA: Diagnosis present

## 2014-07-27 DIAGNOSIS — IMO0002 Reserved for concepts with insufficient information to code with codable children: Secondary | ICD-10-CM | POA: Diagnosis present

## 2014-07-27 DIAGNOSIS — Z7982 Long term (current) use of aspirin: Secondary | ICD-10-CM

## 2014-07-27 DIAGNOSIS — Z01810 Encounter for preprocedural cardiovascular examination: Secondary | ICD-10-CM

## 2014-07-27 DIAGNOSIS — Z800 Family history of malignant neoplasm of digestive organs: Secondary | ICD-10-CM | POA: Diagnosis not present

## 2014-07-27 DIAGNOSIS — I472 Ventricular tachycardia: Secondary | ICD-10-CM | POA: Diagnosis not present

## 2014-07-27 DIAGNOSIS — I5032 Chronic diastolic (congestive) heart failure: Secondary | ICD-10-CM

## 2014-07-27 DIAGNOSIS — Z882 Allergy status to sulfonamides status: Secondary | ICD-10-CM

## 2014-07-27 DIAGNOSIS — E039 Hypothyroidism, unspecified: Secondary | ICD-10-CM | POA: Diagnosis present

## 2014-07-27 DIAGNOSIS — N183 Chronic kidney disease, stage 3 unspecified: Secondary | ICD-10-CM | POA: Diagnosis present

## 2014-07-27 DIAGNOSIS — D62 Acute posthemorrhagic anemia: Secondary | ICD-10-CM | POA: Diagnosis not present

## 2014-07-27 DIAGNOSIS — D649 Anemia, unspecified: Secondary | ICD-10-CM | POA: Diagnosis present

## 2014-07-27 DIAGNOSIS — Z889 Allergy status to unspecified drugs, medicaments and biological substances status: Secondary | ICD-10-CM | POA: Diagnosis not present

## 2014-07-27 DIAGNOSIS — K2100 Gastro-esophageal reflux disease with esophagitis, without bleeding: Secondary | ICD-10-CM | POA: Diagnosis present

## 2014-07-27 DIAGNOSIS — Z886 Allergy status to analgesic agent status: Secondary | ICD-10-CM | POA: Diagnosis not present

## 2014-07-27 DIAGNOSIS — D72829 Elevated white blood cell count, unspecified: Secondary | ICD-10-CM | POA: Diagnosis present

## 2014-07-27 DIAGNOSIS — J449 Chronic obstructive pulmonary disease, unspecified: Secondary | ICD-10-CM | POA: Diagnosis present

## 2014-07-27 DIAGNOSIS — G9340 Encephalopathy, unspecified: Secondary | ICD-10-CM | POA: Diagnosis not present

## 2014-07-27 DIAGNOSIS — Z8249 Family history of ischemic heart disease and other diseases of the circulatory system: Secondary | ICD-10-CM | POA: Diagnosis not present

## 2014-07-27 DIAGNOSIS — M797 Fibromyalgia: Secondary | ICD-10-CM | POA: Diagnosis present

## 2014-07-27 DIAGNOSIS — Z885 Allergy status to narcotic agent status: Secondary | ICD-10-CM

## 2014-07-27 DIAGNOSIS — I4729 Other ventricular tachycardia: Secondary | ICD-10-CM

## 2014-07-27 DIAGNOSIS — J9601 Acute respiratory failure with hypoxia: Secondary | ICD-10-CM

## 2014-07-27 DIAGNOSIS — J9610 Chronic respiratory failure, unspecified whether with hypoxia or hypercapnia: Secondary | ICD-10-CM | POA: Diagnosis present

## 2014-07-27 DIAGNOSIS — M25551 Pain in right hip: Secondary | ICD-10-CM | POA: Diagnosis present

## 2014-07-27 DIAGNOSIS — N2889 Other specified disorders of kidney and ureter: Secondary | ICD-10-CM | POA: Diagnosis present

## 2014-07-27 DIAGNOSIS — Z9981 Dependence on supplemental oxygen: Secondary | ICD-10-CM | POA: Diagnosis not present

## 2014-07-27 DIAGNOSIS — A0472 Enterocolitis due to Clostridium difficile, not specified as recurrent: Secondary | ICD-10-CM

## 2014-07-27 LAB — BASIC METABOLIC PANEL
Anion gap: 12 (ref 5–15)
BUN: 11 mg/dL (ref 6–23)
CHLORIDE: 94 meq/L — AB (ref 96–112)
CO2: 31 mEq/L (ref 19–32)
CREATININE: 1.02 mg/dL (ref 0.50–1.10)
Calcium: 8.6 mg/dL (ref 8.4–10.5)
GFR, EST AFRICAN AMERICAN: 64 mL/min — AB (ref >90)
GFR, EST NON AFRICAN AMERICAN: 55 mL/min — AB (ref >90)
Glucose, Bld: 85 mg/dL (ref 70–99)
Potassium: 3.6 mEq/L — ABNORMAL LOW (ref 3.7–5.3)
Sodium: 137 mEq/L (ref 137–147)

## 2014-07-27 LAB — URINALYSIS, ROUTINE W REFLEX MICROSCOPIC
BILIRUBIN URINE: NEGATIVE
Glucose, UA: NEGATIVE mg/dL
HGB URINE DIPSTICK: NEGATIVE
Ketones, ur: NEGATIVE mg/dL
Leukocytes, UA: NEGATIVE
Nitrite: NEGATIVE
Protein, ur: NEGATIVE mg/dL
SPECIFIC GRAVITY, URINE: 1.014 (ref 1.005–1.030)
Urobilinogen, UA: 0.2 mg/dL (ref 0.0–1.0)
pH: 7.5 (ref 5.0–8.0)

## 2014-07-27 LAB — CBC WITH DIFFERENTIAL/PLATELET
BASOS PCT: 0 % (ref 0–1)
Basophils Absolute: 0 10*3/uL (ref 0.0–0.1)
EOS ABS: 0.2 10*3/uL (ref 0.0–0.7)
Eosinophils Relative: 1 % (ref 0–5)
HEMATOCRIT: 32.4 % — AB (ref 36.0–46.0)
Hemoglobin: 10.4 g/dL — ABNORMAL LOW (ref 12.0–15.0)
LYMPHS ABS: 0.7 10*3/uL (ref 0.7–4.0)
Lymphocytes Relative: 4 % — ABNORMAL LOW (ref 12–46)
MCH: 27.7 pg (ref 26.0–34.0)
MCHC: 32.1 g/dL (ref 30.0–36.0)
MCV: 86.2 fL (ref 78.0–100.0)
MONO ABS: 0.9 10*3/uL (ref 0.1–1.0)
Monocytes Relative: 4 % (ref 3–12)
Neutro Abs: 19 10*3/uL — ABNORMAL HIGH (ref 1.7–7.7)
Neutrophils Relative %: 91 % — ABNORMAL HIGH (ref 43–77)
Platelets: 307 10*3/uL (ref 150–400)
RBC: 3.76 MIL/uL — AB (ref 3.87–5.11)
RDW: 13.1 % (ref 11.5–15.5)
WBC: 20.9 10*3/uL — ABNORMAL HIGH (ref 4.0–10.5)

## 2014-07-27 LAB — GLUCOSE, CAPILLARY: GLUCOSE-CAPILLARY: 84 mg/dL (ref 70–99)

## 2014-07-27 MED ORDER — ALPRAZOLAM 0.5 MG PO TABS
1.0000 mg | ORAL_TABLET | Freq: Every day | ORAL | Status: DC
  Administered 2014-07-27 – 2014-08-02 (×6): 1 mg via ORAL
  Filled 2014-07-27 (×6): qty 2

## 2014-07-27 MED ORDER — ASPIRIN EC 81 MG PO TBEC
81.0000 mg | DELAYED_RELEASE_TABLET | Freq: Every day | ORAL | Status: DC
  Administered 2014-07-28 – 2014-08-03 (×7): 81 mg via ORAL
  Filled 2014-07-27 (×8): qty 1

## 2014-07-27 MED ORDER — SODIUM CHLORIDE 0.9 % IV SOLN
Freq: Once | INTRAVENOUS | Status: AC
  Administered 2014-07-27: 16:00:00 via INTRAVENOUS

## 2014-07-27 MED ORDER — HYDROMORPHONE HCL 2 MG PO TABS
4.0000 mg | ORAL_TABLET | Freq: Two times a day (BID) | ORAL | Status: DC
  Administered 2014-07-27: 4 mg via ORAL
  Filled 2014-07-27: qty 2

## 2014-07-27 MED ORDER — METOPROLOL SUCCINATE ER 100 MG PO TB24
100.0000 mg | ORAL_TABLET | Freq: Every day | ORAL | Status: DC
  Administered 2014-07-28 – 2014-08-03 (×7): 100 mg via ORAL
  Filled 2014-07-27 (×7): qty 1

## 2014-07-27 MED ORDER — FENTANYL CITRATE 0.05 MG/ML IJ SOLN
50.0000 ug | INTRAMUSCULAR | Status: DC | PRN
  Administered 2014-07-27: 50 ug via INTRAVENOUS
  Filled 2014-07-27: qty 2

## 2014-07-27 MED ORDER — PH 12 STERILE DILUENT
33.4500 ng/kg/min | INTRAVENOUS | Status: DC
  Administered 2014-07-27: 33.45 ng/kg/min via INTRAVENOUS

## 2014-07-27 MED ORDER — FUROSEMIDE 80 MG PO TABS
80.0000 mg | ORAL_TABLET | Freq: Every day | ORAL | Status: DC
  Filled 2014-07-27: qty 1

## 2014-07-27 MED ORDER — ISOSORBIDE MONONITRATE ER 60 MG PO TB24
90.0000 mg | ORAL_TABLET | Freq: Every morning | ORAL | Status: DC
  Administered 2014-07-28 – 2014-08-03 (×7): 90 mg via ORAL
  Filled 2014-07-27 (×7): qty 1

## 2014-07-27 MED ORDER — MORPHINE SULFATE 4 MG/ML IJ SOLN
4.0000 mg | INTRAMUSCULAR | Status: AC | PRN
  Administered 2014-07-27 (×2): 4 mg via INTRAVENOUS
  Filled 2014-07-27 (×2): qty 1

## 2014-07-27 MED ORDER — ICE PACK FOR EPOPROSTENOL (FLOLAN) INFUSION
1.0000 | Freq: Four times a day (QID) | Status: DC
  Administered 2014-07-28 – 2014-08-03 (×22): 1

## 2014-07-27 MED ORDER — INSULIN GLARGINE 100 UNIT/ML ~~LOC~~ SOLN
12.0000 [IU] | Freq: Every morning | SUBCUTANEOUS | Status: DC
  Administered 2014-07-28: 12 [IU] via SUBCUTANEOUS
  Filled 2014-07-27: qty 0.12

## 2014-07-27 MED ORDER — NITROGLYCERIN 0.4 MG SL SUBL: 0.4000 mg | SUBLINGUAL_TABLET | SUBLINGUAL | Status: DC | PRN

## 2014-07-27 MED ORDER — DULOXETINE HCL 30 MG PO CPEP
30.0000 mg | ORAL_CAPSULE | Freq: Every day | ORAL | Status: DC
  Administered 2014-07-28 – 2014-08-02 (×5): 30 mg via ORAL
  Filled 2014-07-27 (×8): qty 1

## 2014-07-27 MED ORDER — ONDANSETRON HCL 8 MG PO TABS
8.0000 mg | ORAL_TABLET | Freq: Two times a day (BID) | ORAL | Status: DC
  Administered 2014-07-27 – 2014-08-03 (×12): 8 mg via ORAL
  Filled 2014-07-27 (×5): qty 1
  Filled 2014-07-27: qty 2
  Filled 2014-07-27: qty 1
  Filled 2014-07-27: qty 2
  Filled 2014-07-27 (×2): qty 1
  Filled 2014-07-27: qty 2
  Filled 2014-07-27 (×7): qty 1

## 2014-07-27 MED ORDER — FUROSEMIDE 10 MG/ML IJ SOLN
80.0000 mg | Freq: Two times a day (BID) | INTRAMUSCULAR | Status: DC
  Administered 2014-07-27: 80 mg via INTRAVENOUS
  Filled 2014-07-27 (×3): qty 8

## 2014-07-27 MED ORDER — DULOXETINE HCL 60 MG PO CPEP
60.0000 mg | ORAL_CAPSULE | Freq: Every day | ORAL | Status: DC
  Administered 2014-07-28 – 2014-08-03 (×7): 60 mg via ORAL
  Filled 2014-07-27 (×7): qty 1

## 2014-07-27 MED ORDER — INSULIN ASPART 100 UNIT/ML ~~LOC~~ SOLN
0.0000 [IU] | Freq: Three times a day (TID) | SUBCUTANEOUS | Status: DC
  Administered 2014-07-28: 2 [IU] via SUBCUTANEOUS

## 2014-07-27 MED ORDER — HEPARIN SODIUM (PORCINE) 5000 UNIT/ML IJ SOLN
5000.0000 [IU] | Freq: Three times a day (TID) | INTRAMUSCULAR | Status: DC
  Administered 2014-07-27 – 2014-07-28 (×3): 5000 [IU] via SUBCUTANEOUS
  Filled 2014-07-27 (×5): qty 1

## 2014-07-27 MED ORDER — ONDANSETRON HCL 4 MG/2ML IJ SOLN
4.0000 mg | INTRAMUSCULAR | Status: DC | PRN
  Administered 2014-07-27: 4 mg via INTRAVENOUS
  Filled 2014-07-27: qty 2

## 2014-07-27 MED ORDER — LEVOTHYROXINE SODIUM 25 MCG PO TABS
25.0000 ug | ORAL_TABLET | Freq: Every day | ORAL | Status: DC
  Filled 2014-07-27 (×2): qty 1

## 2014-07-27 MED ORDER — HYDROMORPHONE HCL 1 MG/ML IJ SOLN
1.0000 mg | INTRAMUSCULAR | Status: DC | PRN
  Administered 2014-07-28 (×3): 1 mg via INTRAVENOUS
  Filled 2014-07-27 (×3): qty 1

## 2014-07-27 NOTE — ED Notes (Signed)
Foley cath inserted, pt tolerated well

## 2014-07-27 NOTE — Consult Note (Signed)
CARDIOLOGY CONSULT NOTE   Patient ID: Meagan [name] MRN: [id number] DOB/AGE: January 27, 1946 68 y.o.  Admit date: 07/27/2014  Primary Physician   Glo Herring., MD Primary Cardiologist   DM, CHF, Dr Gilles Chiquito @ [facility] Reason for Consultation   Pre-op eval  Meagan [name] is a 68 y.o. female with a history of CAD s/p CABG and RCA stent, DM2, obesity, fibromyalgia, diastolic HF, and PAH on Flolan (followed by Dr. Gilles Chiquito at [facility]). She is on chronic home oxygen and had an admission with GI bleeding in August. She has not had recurrent GI bleeding but has had continuous diarrhea over the past week. She has not had severe or worsening dyspnea although she does have chronic dyspnea.  Per her husband, she has balance issues and is weak. She doesn't get out of bed very much except to the bathroom. He has stabilized her to keep her from falling several times when she would get off balance. She uses a walker when she leaves the house, but does not generally use it inside the house.  Today, she was in her usual state of health. She did not take her morning Lasix because she had a doctor's appointment. She was walking across the room and fell. She doesn't believe she lost consciousness. She denies chest pain, acute shortness of breath or palpitations. Her husband heard her fall and found her on the floor. She has a hip fracture and needs surgical repair. Cardiology was asked to do a preoperative evaluation of her surgical risk and help manage her cardiac issues while hospitalized.  [name] is compliant with medications. Her husband helps with the Flolan, changing the bag so making sure everything is done properly. She tries to be compliant with dietary restrictions, but does not weigh herself daily. She has not weighed herself in the last 4 or 5 days.  She was last seen in clinic by Dr. Aundra Dubin on 07/20/2014, and her weight at that time was 150 pounds. Currently she is in the ER on the oxygen at 2  L and is resting comfortably.   Past Medical History  Diagnosis Date  . Fibromyalgia   . Hypercholesterolemia   . Hypertension     pulmonary  pap 110/31 (mean 62)by RHC 9/10 negative PE by chest CT 2010  . Pulmonary edema     Failed Revatio   . ARF (acute renal failure)     poor candidate for ACE -I or ARB  . Restrictive lung disease     obesity hypoventilation syndrome  . Renal insufficiency   . CAD (coronary artery disease)     multivessel DES x 2 RCA 2007  . Chronic diastolic heart failure   . MRSA (methicillin resistant staphylococcus aureus) pneumonia   . Pulmonary hypertension   . Chronic anemia     anemia of chronic disease based on anemia panel 2011  . Pancreatic mass 08/01/2012  . PONV (postoperative nausea and vomiting)   . Heart murmur     "when I was small"  . CHF (congestive heart failure)   . Anginal pain   . Pneumonia X 3  . Type II diabetes mellitus   . Hypothyroidism   . H/O hiatal hernia   . GERD (gastroesophageal reflux disease)   . Anxiety   . On home oxygen therapy     "6L all the time" (02/24/2014     Past Surgical History  Procedure Laterality Date  . Coronary artery bypass graft  1997  LIMA to LAD,SVG to OM  . Esophagogastroduodenoscopy  06/28/10    schatzki ring otherwise normal;small hiatal hernia  . Coronary angioplasty  1993  . Coronary angioplasty with stent placement  ~ 1996; 2008    "think I have 3 stents"  . Abdominal hysterectomy  ~ 1986  . Dilation and curettage of uterus  ~ 1984  . Tubal ligation    . Esophagogastroduodenoscopy N/A 06/06/2014    Procedure: ESOPHAGOGASTRODUODENOSCOPY (EGD);  Surgeon: Lafayette Dragon, MD;  Location: [facility] ENDOSCOPY;  Service: Endoscopy;  Laterality: N/A;    Allergies  Allergen Reactions  . Aspirin     REACTION: Intolerance  . Codeine Other (See Comments)    Messes with digestive system  . Niacin Other (See Comments)    Pins sticking into skin  . Nsaids Other (See Comments)    REACTION:  Aggravates Digestive System   . Phenergan Fortis [Promethazine] Other (See Comments)    Family and pt states becomes very confused and wild.   . Sulfonamide Derivatives Other (See Comments)    Messes with digestive system    I have reviewed the patient's current medications     fentaNYL, ondansetron  Medication Sig  ALPRAZolam (XANAX) 0.5 MG tablet Take 1 mg by mouth at bedtime. For sleep. *Prescribed one tablet three times daily as needed*  aspirin EC 81 MG EC tablet Take 1 tablet (81 mg total) by mouth daily.  DULoxetine (CYMBALTA) 30 MG capsule Take 30 mg by mouth daily.  DULoxetine (CYMBALTA) 60 MG capsule Take 60 mg by mouth every morning.   Epoprostenol Sodium (FLOLAN IV) Inject into the vein. 1.5 mg. Inject into the vein continuously. Infuse mg/kg/min via cont IV pump.  ezetimibe-simvastatin (VYTORIN) 10-40 MG per tablet Take 1 tablet by mouth every evening.  furosemide (LASIX) 40 MG tablet Take 80 mg by mouth daily.   HYDROmorphone (DILAUDID) 2 MG tablet Take 4 mg by mouth 2 (two) times daily.   insulin glargine (LANTUS) 100 UNIT/ML injection Inject 0.12 mLs (12 Units total) into the skin every morning.  isosorbide mononitrate (IMDUR) 60 MG 24 hr tablet Take 90 mg by mouth every morning.   levothyroxine (SYNTHROID, LEVOTHROID) 25 MCG tablet Take 25 mcg by mouth every morning.   metoprolol (TOPROL-XL) 100 MG 24 hr tablet Take 100 mg by mouth every morning.   omeprazole (PRILOSEC) 20 MG capsule Take 20 mg by mouth 2 (two) times daily.    ondansetron (ZOFRAN) 8 MG tablet Take 8 mg by mouth 2 (two) times daily. For nausea  Oxygen Permeable Lens Products SOLN Inhale into the lungs continuous. 6 liters  Oxymetazoline HCl (NASAL SPRAY) 0.05 % SOLN Place 1 spray into the nose as needed (for congestion).  potassium chloride SA (K-DUR,KLOR-CON) 20 MEQ tablet Take 40 mEq by mouth daily.   Probiotic Product (FLORAJEN3 PO) Take 1 tablet by mouth daily.  traMADol (ULTRAM) 50 MG tablet Take  50 mg by mouth every 3 (three) hours as needed. For pain. *takes with Dilaudid (Hydromorphone)  VYTORIN 10-40 MG per tablet TAKE ONE TABLET BY MOUTH AT BEDTIME  nitroGLYCERIN (NITROSTAT) 0.4 MG SL tablet Place 0.4 mg under the tongue every 5 (five) minutes as needed for chest pain.     History   Social History  . Marital Status: Married    Spouse Name: N/A    Number of Children: N/A  . Years of Education: N/A   Occupational History  .  retired    Social History Main Topics  .  Smoking status: Never Smoker   . Smokeless tobacco: Never Used  . Alcohol Use: No  . Drug Use: No  . Sexual Activity: Not Currently   Other Topics Concern  . Not on file   Social History Narrative  .  lives with husband who helps with her care     Family Status  Relation Status Death Age  . Mother Deceased   . Father Deceased   . Sister Alive   . Brother Alive   . Sister Alive     80  . Brother Deceased     motor vehicle accident   Family History  Problem Relation Age of Onset  . Cancer Mother     oral  . Heart attack Father   . COPD Brother   . Heart disease Brother      ROS: Generalized weakness along with balance problems, no recent acute illnesses or injuries. Significant chronic diarrhea, recent GI bleeding but no bleeding since discharge. Other than as noted above the remainder of the review of systems is unremarkable.  Physical Exam: Blood pressure 127/50, pulse 75, temperature 98.1 F (36.7 C), temperature source Oral, resp. rate 20, height 5' (1.524 m), weight 150 lb (68.04 kg), SpO2 96.00%.   General: Elderly woman wearing oxygen complaining of mild hip pain but otherwise in no acute distress  Head: Eyes PERRLA, No xanthomas.   Normocephalic and atraumatic, oropharynx without edema or exudate. Dentition: poor Lungs: dense rales bases, healed median sternotomy scar Heart: HRRR S1 S2, no rub/gallop, 2/6 murmur. pulses are 2+ all 4 extrem.   Neck: No carotid bruits. No  lymphadenopathy.  JVD at 9 cm Abdomen: Bowel sounds present, abdomen soft and non-tender without masses or hernias noted. Msk:  No spine or cva tenderness. No weakness, + joint deformity right femur, no effusions. Extremities: No clubbing or cyanosis. Trace edema. Healed saphenous harvesting scars noted on the right leg Neuro: Alert and oriented X 2. No focal deficits noted. Psych:  Good affect, responds appropriately Skin: No rashes or lesions noted. Very dry skin.  Labs:   Lab Results  Component Value Date   WBC 20.9* 07/27/2014   HGB 10.4* 07/27/2014   HCT 32.4* 07/27/2014   MCV 86.2 07/27/2014   PLT 307 07/27/2014     Recent Labs Lab 07/27/14 1530  NA 137  K 3.6*  CL 94*  CO2 31  BUN 11  CREATININE 1.02  CALCIUM 8.6  GLUCOSE 85   Magnesium  Date Value Ref Range Status  06/09/2014 1.8  1.5 - 2.5 mg/dL Final   Echo: 06/05/2014 Conclusions - Left ventricle: The cavity size was normal. Systolic function was vigorous. The estimated ejection fraction was in the range of 65% to 70%. Wall motion was normal; there were no regional wall motion abnormalities. Doppler parameters are consistent with abnormal left ventricular relaxation (grade 1 diastolic dysfunction). - Right ventricle: The cavity size was mildly dilated. Wall thickness was normal.   ECG:  pending  Cardiac Cath: 02/23/2014 RHC/LHC 02/23/14  RA = 9  RV = 67/5/10  PA = 71/21 (39)  PCW = 13  Fick cardiac output/index = 5.5/3.1  PVR = 4.6 Woods  FA sat = 94%  PA sat = 63%,64%  Ao Pressure: 177/76 (119)  LV Pressure: 169/9/20  Left main: Heavily calcified. Distal 40-50%  LAD: Diffuse severe disease. Totaled in midsection. Mid to distal vessel fills from LIMA. 2 diagonals with diffuse disease. There is a 70-80% lesion in the midsection of  the 2nd diagonal  LCX: Small heavily diseased vessel. 30-40% ostial. Long 95% lesion in mid AV groove after take-off of OM-1. Distal vessel small and diffusely diseased. OM-1  mild plaque. OM-2 and OM-3 occluded  RCA: Ostial spasm. Long stent in midsection is patent. Distal RCA 40% before PDA. In Mid PDA50% lesion. Mild to moderate plaque in PLs. R to L collaterals filling portion of LCX and septals  LIMA-LAD: widely patent with 70-75% eccentric lesion in LAD after insertion of LIMA. The apical LAD is occluded.  SVG-OM: totally occluded proximally (chronic)    Radiology:  Dg Chest 1 View 07/27/2014   CLINICAL DATA:  Fall.  Right hip pain  EXAM: CHEST - 1 VIEW  COMPARISON:  06/04/2014  FINDINGS: Prior CABG. Central venous catheter tip in the right atrium unchanged.  Diffuse bilateral airspace disease shows mild progression and may represent edema or diffuse pneumonia. Small pleural effusions.  IMPRESSION: Diffuse bilateral airspace disease suggestive of pulmonary edema.   Electronically Signed   By: Franchot Gallo M.D.   On: 07/27/2014 16:42    ASSESSMENT AND PLAN:   Principal Problem:   Closed right hip fracture - management per orthopedics, surgery tentatively planned 10/07, not a first case. It can be delayed until Thursday per Dr. Erlinda Hong, if needed.  From a cardiovascular viewpoint, she is at increased risk of complications from general anesthesia due to her pulmonary hypertension and multiple comorbidities. Due to her intravenous Flolan, would recommend further evaluation and recommendations by the heart failure team in terms of perioperative management. Also will need close consultation between anesthesia and the heart failure team to determine the best route for anesthesia and repair of the hip.   Active Problems:   Acute on chronic diastolic heart failure - patient did not take her home Lasix dose of 80 mg because she had a doctor's appointment and would've been out of the house. Will discuss with M.D., but feels she has volume overload and will need IV Lasix, consider 80 mg tonight and in a.m. and hopefully, her volume status would be good enough to tolerate surgery.  Continue Flolan, OK with pharmacy to continue on her home pump.     DM (diabetes mellitus), type 2, uncontrolled - per IM    PAH (pulmonary artery hypertension) - continue on Flolan and await further recommendations by team   Reflux esophagitis - per IM    C. difficile diarrhea - per IM    CKD (chronic kidney disease), stage III - follow closely with diuresis, per IM    Hx CAD - significant CAD, cath results from 02/2014 above, continue BB in the perioperative period.     Kerry Hough. MD [facility]  07/27/2014 8:20 pm

## 2014-07-27 NOTE — ED Provider Notes (Signed)
CSN: [id number]     Arrival date & time 07/27/14  1438 History   First MD Initiated Contact with Patient 07/27/14 1505     Chief Complaint  Patient presents with  . Fall  . right hip pain      HPI Pt was seen at 1505. Per EMS and pt report, c/o sudden onset and persistence of constant right hip "pain" that occurred PTA. Pt states she slipped and fell onto her right hip while walking in her house. Pt was unable to stand and weight bear on her RLE due to pain. Denies syncope, no head injury, no neck or back pain, no CP/SOB, no abd pain, no focal motor weakness, no tingling/numbness in extremities.    Past Medical History  Diagnosis Date  . Fibromyalgia   . Hypercholesterolemia   . Hypertension     pulmonary  pap 110/31 (mean 62)by RHC 9/10 negative PE by chest CT 2010  . Pulmonary edema     Failed Revatio   . ARF (acute renal failure)     poor candidate for ACE -I or ARB  . Restrictive lung disease     obesity hypoventilation syndrome  . Renal insufficiency   . CAD (coronary artery disease)     multivessel DES x 2 RCA 2007  . Chronic diastolic heart failure   . MRSA (methicillin resistant staphylococcus aureus) pneumonia   . Pulmonary hypertension   . Chronic anemia     anemia of chronic disease based on anemia panel 2011  . Pancreatic mass 08/01/2012  . PONV (postoperative nausea and vomiting)   . Heart murmur     "when I was small"  . CHF (congestive heart failure)   . Anginal pain   . Pneumonia X 3  . Type II diabetes mellitus   . Hypothyroidism   . H/O hiatal hernia   . GERD (gastroesophageal reflux disease)   . Anxiety   . On home oxygen therapy     "6L all the time" (02/24/2014   Past Surgical History  Procedure Laterality Date  . Coronary artery bypass graft  1997    LIMA to LAD,SVG to OM  . Esophagogastroduodenoscopy  06/28/10    schatzki ring otherwise normal;small hiatal hernia  . Coronary angioplasty  1993  . Coronary angioplasty with stent placement  ~  1996; 2008    "think I have 3 stents"  . Abdominal hysterectomy  ~ 1986  . Dilation and curettage of uterus  ~ 1984  . Tubal ligation    . Esophagogastroduodenoscopy N/A 06/06/2014    Procedure: ESOPHAGOGASTRODUODENOSCOPY (EGD);  Surgeon: Lafayette Dragon, MD;  Location: [facility] ENDOSCOPY;  Service: Endoscopy;  Laterality: N/A;   Family History  Problem Relation Age of Onset  . Cancer Mother     oral  . Heart attack Father   . COPD Brother   . Heart disease Brother    History  Substance Use Topics  . Smoking status: Never Smoker   . Smokeless tobacco: Never Used  . Alcohol Use: No    Review of Systems ROS: Statement: All systems negative except as marked or noted in the HPI; Constitutional: Negative for fever and chills. ; ; Eyes: Negative for eye pain, redness and discharge. ; ; ENMT: Negative for ear pain, hoarseness, nasal congestion, sinus pressure and sore throat. ; ; Cardiovascular: Negative for chest pain, palpitations, diaphoresis, dyspnea and peripheral edema. ; ; Respiratory: Negative for cough, wheezing and stridor. ; ; Gastrointestinal: Negative for nausea, vomiting,  diarrhea, abdominal pain, blood in stool, hematemesis, jaundice and rectal bleeding. . ; ; Genitourinary: Negative for dysuria, flank pain and hematuria. ; ; Musculoskeletal: +right hip pain. Negative for back pain and neck pain. Negative for swelling.; ; Skin: Negative for pruritus, rash, abrasions, blisters, bruising and skin lesion.; ; Neuro: Negative for headache, lightheadedness and neck stiffness. Negative for weakness, altered level of consciousness , altered mental status, extremity weakness, paresthesias, involuntary movement, seizure and syncope.     Allergies  Aspirin; Codeine; Niacin; Nsaids; Phenergan fortis; and Sulfonamide derivatives  Home Medications   Prior to Admission medications   Medication Sig Start Date End Date Taking? Authorizing Provider  ALPRAZolam Duanne Moron) 0.5 MG tablet Take 1 mg by mouth  at bedtime. For sleep. *Prescribed one tablet three times daily as needed*   Yes Historical Provider, MD  aspirin EC 81 MG EC tablet Take 1 tablet (81 mg total) by mouth daily. 06/09/14  Yes Modena Jansky, MD  DULoxetine (CYMBALTA) 30 MG capsule Take 30 mg by mouth daily.   Yes Historical Provider, MD  DULoxetine (CYMBALTA) 60 MG capsule Take 60 mg by mouth every morning.    Yes Historical Provider, MD  Epoprostenol Sodium (FLOLAN IV) Inject into the vein. 1.5 mg. Inject into the vein continuously. Infuse mg/kg/min via cont IV pump.   Yes Historical Provider, MD  ezetimibe-simvastatin (VYTORIN) 10-40 MG per tablet Take 1 tablet by mouth every evening.   Yes Historical Provider, MD  furosemide (LASIX) 40 MG tablet Take 80 mg by mouth daily.    Yes Historical Provider, MD  HYDROmorphone (DILAUDID) 2 MG tablet Take 4 mg by mouth 2 (two) times daily.    Yes Historical Provider, MD  insulin glargine (LANTUS) 100 UNIT/ML injection Inject 0.12 mLs (12 Units total) into the skin every morning. 06/09/14  Yes Modena Jansky, MD  isosorbide mononitrate (IMDUR) 60 MG 24 hr tablet Take 90 mg by mouth every morning.    Yes Historical Provider, MD  levothyroxine (SYNTHROID, LEVOTHROID) 25 MCG tablet Take 25 mcg by mouth every morning.    Yes Historical Provider, MD  metoprolol (TOPROL-XL) 100 MG 24 hr tablet Take 100 mg by mouth every morning.    Yes Historical Provider, MD  omeprazole (PRILOSEC) 20 MG capsule Take 20 mg by mouth 2 (two) times daily.     Yes Historical Provider, MD  ondansetron (ZOFRAN) 8 MG tablet Take 8 mg by mouth 2 (two) times daily. For nausea   Yes Historical Provider, MD  Oxygen Permeable Lens Products SOLN Inhale into the lungs continuous. 6 liters 12/31/11  Yes Jolaine Artist, MD  Oxymetazoline HCl (NASAL SPRAY) 0.05 % SOLN Place 1 spray into the nose as needed (for congestion).   Yes Historical Provider, MD  potassium chloride SA (K-DUR,KLOR-CON) 20 MEQ tablet Take 40 mEq by mouth  daily.  09/02/12  Yes Historical Provider, MD  Probiotic Product (FLORAJEN3 PO) Take 1 tablet by mouth daily.   Yes Historical Provider, MD  traMADol (ULTRAM) 50 MG tablet Take 50 mg by mouth every 3 (three) hours as needed. For pain. *takes with Dilaudid (Hydromorphone) 04/22/12  Yes Historical Provider, MD  VYTORIN 10-40 MG per tablet TAKE ONE TABLET BY MOUTH AT BEDTIME 07/05/14  Yes Jolaine Artist, MD  nitroGLYCERIN (NITROSTAT) 0.4 MG SL tablet Place 0.4 mg under the tongue every 5 (five) minutes as needed for chest pain.     Historical Provider, MD   BP 127/50  Pulse 75  Temp(Src)  98.1 F (36.7 C) (Oral)  Resp 20  Ht 5' (1.524 m)  Wt 150 lb (68.04 kg)  BMI 29.30 kg/m2  SpO2 96% Physical Exam 1510: Physical examination:  Nursing notes reviewed; Vital signs and O2 SAT reviewed;  Constitutional: Well developed, Well nourished, Well hydrated, In no acute distress; Head:  Normocephalic, atraumatic; Eyes: EOMI, PERRL, No scleral icterus; ENMT: Mouth and pharynx normal, Mucous membranes moist; Neck: Supple, Full range of motion, No lymphadenopathy; Cardiovascular: Regular rate and rhythm, No gallop; Respiratory: Breath sounds clear & equal bilaterally, No wheezes.  Speaking full sentences with ease, Normal respiratory effort/excursion; Chest: Nontender, Movement normal; Abdomen: Soft, Nontender, Nondistended, Normal bowel sounds; Genitourinary: No CVA tenderness; Extremities: +TTP right hip with decreased ROM and RLE shortened and externally rotated. Pelvis stable. NT right knee/ankle/foot. Pulses normal, No open wounds. No edema, No calf edema or asymmetry.; Neuro: AA&Ox3, Major CN grossly intact.  Speech clear. No gross focal motor or sensory deficits in extremities.; Skin: Color normal, Warm, Dry.   ED Course  Procedures    EKG Interpretation None      MDM  MDM Reviewed: previous chart, nursing note and vitals Reviewed previous: labs Interpretation: labs and x-ray   Results for  orders placed during the hospital encounter of 07/27/14  CBC WITH DIFFERENTIAL      Result Value Ref Range   WBC 20.9 (*) 4.0 - 10.5 K/uL   RBC 3.76 (*) 3.87 - 5.11 MIL/uL   Hemoglobin 10.4 (*) 12.0 - 15.0 g/dL   HCT 32.4 (*) 36.0 - 46.0 %   MCV 86.2  78.0 - 100.0 fL   MCH 27.7  26.0 - 34.0 pg   MCHC 32.1  30.0 - 36.0 g/dL   RDW 13.1  11.5 - 15.5 %   Platelets 307  150 - 400 K/uL   Neutrophils Relative % 91 (*) 43 - 77 %   Neutro Abs 19.0 (*) 1.7 - 7.7 K/uL   Lymphocytes Relative 4 (*) 12 - 46 %   Lymphs Abs 0.7  0.7 - 4.0 K/uL   Monocytes Relative 4  3 - 12 %   Monocytes Absolute 0.9  0.1 - 1.0 K/uL   Eosinophils Relative 1  0 - 5 %   Eosinophils Absolute 0.2  0.0 - 0.7 K/uL   Basophils Relative 0  0 - 1 %   Basophils Absolute 0.0  0.0 - 0.1 K/uL  BASIC METABOLIC PANEL      Result Value Ref Range   Sodium 137  137 - 147 mEq/L   Potassium 3.6 (*) 3.7 - 5.3 mEq/L   Chloride 94 (*) 96 - 112 mEq/L   CO2 31  19 - 32 mEq/L   Glucose, Bld 85  70 - 99 mg/dL   BUN 11  6 - 23 mg/dL   Creatinine, Ser 1.02  0.50 - 1.10 mg/dL   Calcium 8.6  8.4 - 10.5 mg/dL   GFR calc non Af Amer 55 (*) >90 mL/min   GFR calc Af Amer 64 (*) >90 mL/min   Anion gap 12  5 - 15   Dg Chest 1 View 07/27/2014   CLINICAL DATA:  Fall.  Right hip pain  EXAM: CHEST - 1 VIEW  COMPARISON:  06/04/2014  FINDINGS: Prior CABG. Central venous catheter tip in the right atrium unchanged.  Diffuse bilateral airspace disease shows mild progression and may represent edema or diffuse pneumonia. Small pleural effusions.  IMPRESSION: Diffuse bilateral airspace disease suggestive of pulmonary edema.  Electronically Signed   By: Franchot Gallo M.D.   On: 07/27/2014 16:42   Dg Hip Complete Right 07/27/2014   CLINICAL DATA:  Fall.  Right hip pain 1 day  EXAM: RIGHT HIP - COMPLETE 2+ VIEW  COMPARISON:  None.  FINDINGS: Right femoral neck fracture with mild displacement. Mild joint space narrowing of the right hip joint. No other  fracture. Left hip joint is negative  IMPRESSION: Right femoral neck fracture with displacement.   Electronically Signed   By: Franchot Gallo M.D.   On: 07/27/2014 16:40    1700:  Chronic leukocytosis. New hip fx. Dx and testing d/w pt and family.  Questions answered.  Verb understanding, agreeable to admit.  T/C to Ortho Dr. Erlinda Hong, case discussed, including:  HPI, pertinent PM/SHx, VS/PE, dx testing, ED course and treatment:  Agreeable to consult, requests he will come to the ED for evaluation.  T/C to [facility]. Hartford Poli, case discussed, including:  HPI, pertinent PM/SHx, VS/PE, dx testing, ED course and treatment:  Agreeable to admit, requests to write temporary orders, obtain inpt tele bed to team 10.     Francine Graven, DO 07/28/14 [phone]

## 2014-07-27 NOTE — Consult Note (Signed)
ORTHOPAEDIC CONSULTATION  REQUESTING PHYSICIAN: Verlee Monte, MD  Chief Complaint: right hip pain  HPI: Meagan [name] is a 68 y.o. female who complains of right hip fx s/p mechanical fall.  Denies LOC, neck pain, abd pain.  Patient has pulmonary HTN on infusion of Flolan managed by [facility].  Ambulates with a walker at home.  Ortho consulted for hip fx.  Past Medical History  Diagnosis Date  . Fibromyalgia   . Hypercholesterolemia   . Hypertension     pulmonary  pap 110/31 (mean 62)by RHC 9/10 negative PE by chest CT 2010  . Pulmonary edema     Failed Revatio   . ARF (acute renal failure)     poor candidate for ACE -I or ARB  . Restrictive lung disease     obesity hypoventilation syndrome  . Renal insufficiency   . CAD (coronary artery disease)     multivessel DES x 2 RCA 2007  . Chronic diastolic heart failure   . MRSA (methicillin resistant staphylococcus aureus) pneumonia   . Pulmonary hypertension   . Chronic anemia     anemia of chronic disease based on anemia panel 2011  . Pancreatic mass 08/01/2012  . PONV (postoperative nausea and vomiting)   . Heart murmur     "when I was small"  . CHF (congestive heart failure)   . Anginal pain   . Pneumonia X 3  . Type II diabetes mellitus   . Hypothyroidism   . H/O hiatal hernia   . GERD (gastroesophageal reflux disease)   . Anxiety   . On home oxygen therapy     "6L all the time" (02/24/2014   Past Surgical History  Procedure Laterality Date  . Coronary artery bypass graft  1997    LIMA to LAD,SVG to OM  . Esophagogastroduodenoscopy  06/28/10    schatzki ring otherwise normal;small hiatal hernia  . Coronary angioplasty  1993  . Coronary angioplasty with stent placement  ~ 1996; 2008    "think I have 3 stents"  . Abdominal hysterectomy  ~ 1986  . Dilation and curettage of uterus  ~ 1984  . Tubal ligation    . Esophagogastroduodenoscopy N/A 06/06/2014    Procedure: ESOPHAGOGASTRODUODENOSCOPY (EGD);  Surgeon: Lafayette Dragon, MD;  Location: [facility] ENDOSCOPY;  Service: Endoscopy;  Laterality: N/A;   History   Social History  . Marital Status: Married    Spouse Name: N/A    Number of Children: N/A  . Years of Education: N/A   Social History Main Topics  . Smoking status: Never Smoker   . Smokeless tobacco: Never Used  . Alcohol Use: No  . Drug Use: No  . Sexual Activity: Not Currently   Other Topics Concern  . None   Social History Narrative  . None   Family History  Problem Relation Age of Onset  . Cancer Mother     oral  . Heart attack Father   . COPD Brother   . Heart disease Brother    Allergies  Allergen Reactions  . Aspirin     REACTION: Intolerance  . Codeine Other (See Comments)    Messes with digestive system  . Niacin Other (See Comments)    Pins sticking into skin  . Nsaids Other (See Comments)    REACTION: Aggravates Digestive System   . Phenergan Fortis [Promethazine] Other (See Comments)    Family and pt states becomes very confused and wild.   . Sulfonamide Derivatives Other (  See Comments)    Messes with digestive system   Prior to Admission medications   Medication Sig Start Date End Date Taking? Authorizing Provider  ALPRAZolam Duanne Moron) 0.5 MG tablet Take 1 mg by mouth at bedtime. For sleep. *Prescribed one tablet three times daily as needed*   Yes Historical Provider, MD  aspirin EC 81 MG EC tablet Take 1 tablet (81 mg total) by mouth daily. 06/09/14  Yes Modena Jansky, MD  DULoxetine (CYMBALTA) 30 MG capsule Take 30 mg by mouth daily.   Yes Historical Provider, MD  DULoxetine (CYMBALTA) 60 MG capsule Take 60 mg by mouth every morning.    Yes Historical Provider, MD  Epoprostenol Sodium (FLOLAN IV) Inject into the vein. 1.5 mg. Inject into the vein continuously. Infuse mg/kg/min via cont IV pump.   Yes Historical Provider, MD  ezetimibe-simvastatin (VYTORIN) 10-40 MG per tablet Take 1 tablet by mouth every evening.   Yes Historical Provider, MD  furosemide (LASIX)  40 MG tablet Take 80 mg by mouth daily.    Yes Historical Provider, MD  HYDROmorphone (DILAUDID) 2 MG tablet Take 4 mg by mouth 2 (two) times daily.    Yes Historical Provider, MD  insulin glargine (LANTUS) 100 UNIT/ML injection Inject 0.12 mLs (12 Units total) into the skin every morning. 06/09/14  Yes Modena Jansky, MD  isosorbide mononitrate (IMDUR) 60 MG 24 hr tablet Take 90 mg by mouth every morning.    Yes Historical Provider, MD  levothyroxine (SYNTHROID, LEVOTHROID) 25 MCG tablet Take 25 mcg by mouth every morning.    Yes Historical Provider, MD  metoprolol (TOPROL-XL) 100 MG 24 hr tablet Take 100 mg by mouth every morning.    Yes Historical Provider, MD  omeprazole (PRILOSEC) 20 MG capsule Take 20 mg by mouth 2 (two) times daily.     Yes Historical Provider, MD  ondansetron (ZOFRAN) 8 MG tablet Take 8 mg by mouth 2 (two) times daily. For nausea   Yes Historical Provider, MD  Oxygen Permeable Lens Products SOLN Inhale into the lungs continuous. 6 liters 12/31/11  Yes Jolaine Artist, MD  Oxymetazoline HCl (NASAL SPRAY) 0.05 % SOLN Place 1 spray into the nose as needed (for congestion).   Yes Historical Provider, MD  potassium chloride SA (K-DUR,KLOR-CON) 20 MEQ tablet Take 40 mEq by mouth daily.  09/02/12  Yes Historical Provider, MD  Probiotic Product (FLORAJEN3 PO) Take 1 tablet by mouth daily.   Yes Historical Provider, MD  traMADol (ULTRAM) 50 MG tablet Take 50 mg by mouth every 3 (three) hours as needed. For pain. *takes with Dilaudid (Hydromorphone) 04/22/12  Yes Historical Provider, MD  VYTORIN 10-40 MG per tablet TAKE ONE TABLET BY MOUTH AT BEDTIME 07/05/14  Yes Jolaine Artist, MD  nitroGLYCERIN (NITROSTAT) 0.4 MG SL tablet Place 0.4 mg under the tongue every 5 (five) minutes as needed for chest pain.     Historical Provider, MD   Dg Chest 1 View  07/27/2014   CLINICAL DATA:  Fall.  Right hip pain  EXAM: CHEST - 1 VIEW  COMPARISON:  06/04/2014  FINDINGS: Prior CABG. Central  venous catheter tip in the right atrium unchanged.  Diffuse bilateral airspace disease shows mild progression and may represent edema or diffuse pneumonia. Small pleural effusions.  IMPRESSION: Diffuse bilateral airspace disease suggestive of pulmonary edema.   Electronically Signed   By: Franchot Gallo M.D.   On: 07/27/2014 16:42   Dg Hip Complete Right  07/27/2014   CLINICAL  DATA:  Fall.  Right hip pain 1 day  EXAM: RIGHT HIP - COMPLETE 2+ VIEW  COMPARISON:  None.  FINDINGS: Right femoral neck fracture with mild displacement. Mild joint space narrowing of the right hip joint. No other fracture. Left hip joint is negative  IMPRESSION: Right femoral neck fracture with displacement.   Electronically Signed   By: Franchot Gallo M.D.   On: 07/27/2014 16:40    Positive ROS: All other systems have been reviewed and were otherwise negative with the exception of those mentioned in the HPI and as above.  Physical Exam: General: Alert, no acute distress Cardiovascular: No pedal edema Respiratory: No cyanosis, no use of accessory musculature GI: No organomegaly, abdomen is soft and non-tender Skin: No lesions in the area of chief complaint Neurologic: Sensation intact distally Psychiatric: Patient is competent for consent with normal mood and affect Lymphatic: No axillary or cervical lymphadenopathy  MUSCULOSKELETAL:  - painful ROM of hip - skin intact - foot wwp, NVI  Assessment: Right femoral neck fx  Plan: - recommend surgical treatment with partial hip replacement - discussed r/b/a to surgery with patient and husband, they wish to proceed - will await medical optimization and clearance from cards about flolan - foley - consent signed - patient can eat tonight from ortho standpoint, no surgery tonight  Thank you for the consult and the opportunity to see Ms. Steines  N. Eduard Roux, MD [place] 7:11 PM

## 2014-07-27 NOTE — ED Notes (Signed)
Admitting MD in to assess pt at this time 

## 2014-07-27 NOTE — ED Notes (Addendum)
To ED via [facility] with c/o right leg/hip pain from fall-- right leg is shorter and externally rotated. Family picked pt up and placed into a w/c-- pt received MS 5 mg IM in right deltoid per EMS at 1355.

## 2014-07-27 NOTE — ED Notes (Signed)
Dr. Erlinda Hong in to assess pt at this time.   Rosaria Ferries NP with cardiologist in to assess pt as well.

## 2014-07-27 NOTE — H&P (Signed)
[facility] History and Physical  SHAWNEEQUA BALDRIDGE ZMO:[id number] DOB: Nov 06, 1945 DOA: 07/27/2014  Referring physician: Thurnell Garbe PCP: Glo Herring., MD   Chief Complaint: Right hip fracture  HPI: Meagan [name] is a 68 y.o. female with past medical history of CHF, severe PAH on continuous Flolan infusion, recent C. difficile colitis, history of MI and IDDM. Patient brought to the hospital after she fell and broke her right hip. Patient said this morning she was getting ready to see her urologist, she got out of the bathroom and she fell, severe right hip pain, couldn't get up. Brought to the hospital for further evaluation. Patient denies any shortness of breath more than usual, per her husband she's been unsteady since discharge from the hospital recently for BRBPR and C. difficile colitis. In the ED x-ray of the right hip showed right femoral neck fracture with displacement, orthopedics Dr. Erlinda Hong consulted, [facility] to admit the patient.   Review of Systems:  Constitutional: negative for anorexia, fevers and sweats Eyes: negative for irritation, redness and visual disturbance Ears, nose, mouth, throat, and face: negative for earaches, epistaxis, nasal congestion and sore throat Respiratory: negative for cough, dyspnea on exertion, sputum and wheezing Cardiovascular: negative for chest pain, dyspnea, lower extremity edema, orthopnea, palpitations and syncope Gastrointestinal: negative for abdominal pain, constipation, diarrhea, melena, nausea and vomiting Genitourinary:negative for dysuria, frequency and hematuria Hematologic/lymphatic: negative for bleeding, easy bruising and lymphadenopathy Musculoskeletal: Right hip pain Neurological: negative for coordination problems, gait problems, headaches and weakness Endocrine: negative for diabetic symptoms including polydipsia, polyuria and weight loss Allergic/Immunologic: negative for anaphylaxis, hay fever and  urticaria  Past Medical History  Diagnosis Date  . Fibromyalgia   . Hypercholesterolemia   . Hypertension     pulmonary  pap 110/31 (mean 62)by [facility] 9/10 negative PE by chest CT 2010  . Pulmonary edema     Failed Revatio   . ARF (acute renal failure)     poor candidate for ACE -I or ARB  . Restrictive lung disease     obesity hypoventilation syndrome  . Renal insufficiency   . CAD (coronary artery disease)     multivessel DES x 2 RCA 2007  . Chronic diastolic heart failure   . MRSA (methicillin resistant staphylococcus aureus) pneumonia   . Pulmonary hypertension   . Chronic anemia     anemia of chronic disease based on anemia panel 2011  . Pancreatic mass 08/01/2012  . PONV (postoperative nausea and vomiting)   . Heart murmur     "when I was small"  . CHF (congestive heart failure)   . Anginal pain   . Pneumonia X 3  . Type II diabetes mellitus   . Hypothyroidism   . H/O hiatal hernia   . GERD (gastroesophageal reflux disease)   . Anxiety   . On home oxygen therapy     "6L all the time" (02/24/2014   Past Surgical History  Procedure Laterality Date  . Coronary artery bypass graft  1997    LIMA to LAD,SVG to OM  . Esophagogastroduodenoscopy  06/28/10    schatzki ring otherwise normal;small hiatal hernia  . Coronary angioplasty  1993  . Coronary angioplasty with stent placement  ~ 1996; 2008    "think I have 3 stents"  . Abdominal hysterectomy  ~ 1986  . Dilation and curettage of uterus  ~ 1984  . Tubal ligation    . Esophagogastroduodenoscopy N/A 06/06/2014    Procedure: ESOPHAGOGASTRODUODENOSCOPY (EGD);  Surgeon: Lowella Bandy  Olevia Perches, MD;  Location: [facility] ENDOSCOPY;  Service: Endoscopy;  Laterality: N/A;   Social History:   reports that she has never smoked. She has never used smokeless tobacco. She reports that she does not drink alcohol or use illicit drugs.  Allergies  Allergen Reactions  . Aspirin     REACTION: Intolerance  . Codeine Other (See Comments)    Messes  with digestive system  . Niacin Other (See Comments)    Pins sticking into skin  . Nsaids Other (See Comments)    REACTION: Aggravates Digestive System   . Phenergan Fortis [Promethazine] Other (See Comments)    Family and pt states becomes very confused and wild.   . Sulfonamide Derivatives Other (See Comments)    Messes with digestive system    Family History  Problem Relation Age of Onset  . Cancer Mother     oral  . Heart attack Father   . COPD Brother   . Heart disease Brother      Prior to Admission medications   Medication Sig Start Date End Date Taking? Authorizing Provider  ALPRAZolam Duanne Moron) 0.5 MG tablet Take 1 mg by mouth at bedtime. For sleep. *Prescribed one tablet three times daily as needed*   Yes Historical Provider, MD  aspirin EC 81 MG EC tablet Take 1 tablet (81 mg total) by mouth daily. 06/09/14  Yes Modena Jansky, MD  DULoxetine (CYMBALTA) 30 MG capsule Take 30 mg by mouth daily.   Yes Historical Provider, MD  DULoxetine (CYMBALTA) 60 MG capsule Take 60 mg by mouth every morning.    Yes Historical Provider, MD  Epoprostenol Sodium (FLOLAN IV) Inject into the vein. 1.5 mg. Inject into the vein continuously. Infuse mg/kg/min via cont IV pump.   Yes Historical Provider, MD  ezetimibe-simvastatin (VYTORIN) 10-40 MG per tablet Take 1 tablet by mouth every evening.   Yes Historical Provider, MD  furosemide (LASIX) 40 MG tablet Take 80 mg by mouth daily.    Yes Historical Provider, MD  HYDROmorphone (DILAUDID) 2 MG tablet Take 4 mg by mouth 2 (two) times daily.    Yes Historical Provider, MD  insulin glargine (LANTUS) 100 UNIT/ML injection Inject 0.12 mLs (12 Units total) into the skin every morning. 06/09/14  Yes Modena Jansky, MD  isosorbide mononitrate (IMDUR) 60 MG 24 hr tablet Take 90 mg by mouth every morning.    Yes Historical Provider, MD  levothyroxine (SYNTHROID, LEVOTHROID) 25 MCG tablet Take 25 mcg by mouth every morning.    Yes Historical Provider, MD   metoprolol (TOPROL-XL) 100 MG 24 hr tablet Take 100 mg by mouth every morning.    Yes Historical Provider, MD  omeprazole (PRILOSEC) 20 MG capsule Take 20 mg by mouth 2 (two) times daily.     Yes Historical Provider, MD  ondansetron (ZOFRAN) 8 MG tablet Take 8 mg by mouth 2 (two) times daily. For nausea   Yes Historical Provider, MD  Oxygen Permeable Lens Products SOLN Inhale into the lungs continuous. 6 liters 12/31/11  Yes Jolaine Artist, MD  Oxymetazoline HCl (NASAL SPRAY) 0.05 % SOLN Place 1 spray into the nose as needed (for congestion).   Yes Historical Provider, MD  potassium chloride SA (K-DUR,KLOR-CON) 20 MEQ tablet Take 40 mEq by mouth daily.  09/02/12  Yes Historical Provider, MD  Probiotic Product (FLORAJEN3 PO) Take 1 tablet by mouth daily.   Yes Historical Provider, MD  traMADol (ULTRAM) 50 MG tablet Take 50 mg by mouth every 3 (  three) hours as needed. For pain. *takes with Dilaudid (Hydromorphone) 04/22/12  Yes Historical Provider, MD  VYTORIN 10-40 MG per tablet TAKE ONE TABLET BY MOUTH AT BEDTIME 07/05/14  Yes Jolaine Artist, MD  nitroGLYCERIN (NITROSTAT) 0.4 MG SL tablet Place 0.4 mg under the tongue every 5 (five) minutes as needed for chest pain.     Historical Provider, MD   Physical Exam: Filed Vitals:   07/27/14 1502  BP: 127/50  Pulse: 75  Temp: 98.1 F (36.7 C)  Resp: 20   Constitutional: Oriented to person, place, and time. Well-developed and well-nourished. Cooperative.  Head: Normocephalic and atraumatic.  Nose: Nose normal.  Mouth/Throat: Uvula is midline, oropharynx is clear and moist and mucous membranes are normal.  Eyes: Conjunctivae and EOM are normal. Pupils are equal, round, and reactive to light.  Neck: Trachea normal and normal range of motion. Neck supple.  Cardiovascular: Normal rate, regular rhythm, S1 normal, S2 normal, normal heart sounds and intact distal pulses.   Pulmonary/Chest: Effort normal and breath sounds normal.  Abdominal: Soft.  Bowel sounds are normal. There is no hepatosplenomegaly. There is no tenderness.  Musculoskeletal: Shortened and external rotation of the lower extremity  Neurological: Alert and oriented to person, place, and time. Has normal strength. No cranial nerve deficit or sensory deficit.  Skin: Skin is warm, dry and intact.  Psychiatric: Has a normal mood and affect. Speech is normal and behavior is normal.   Labs on Admission:  Basic Metabolic Panel:  Recent Labs Lab 07/27/14 1530  NA 137  K 3.6*  CL 94*  CO2 31  GLUCOSE 85  BUN 11  CREATININE 1.02  CALCIUM 8.6   Liver Function Tests: No results found for this basename: AST, ALT, ALKPHOS, BILITOT, PROT, ALBUMIN,  in the last 168 hours No results found for this basename: LIPASE, AMYLASE,  in the last 168 hours No results found for this basename: AMMONIA,  in the last 168 hours CBC:  Recent Labs Lab 07/27/14 1530  WBC 20.9*  NEUTROABS 19.0*  HGB 10.4*  HCT 32.4*  MCV 86.2  PLT 307   Cardiac Enzymes: No results found for this basename: CKTOTAL, CKMB, CKMBINDEX, TROPONINI,  in the last 168 hours  BNP (last 3 results)  Recent Labs  02/23/14 1006  PROBNP 803.2*   CBG: No results found for this basename: GLUCAP,  in the last 168 hours  Radiological Exams on Admission: Dg Chest 1 View  07/27/2014   CLINICAL DATA:  Fall.  Right hip pain  EXAM: CHEST - 1 VIEW  COMPARISON:  06/04/2014  FINDINGS: Prior CABG. Central venous catheter tip in the right atrium unchanged.  Diffuse bilateral airspace disease shows mild progression and may represent edema or diffuse pneumonia. Small pleural effusions.  IMPRESSION: Diffuse bilateral airspace disease suggestive of pulmonary edema.   Electronically Signed   By: Franchot Gallo M.D.   On: 07/27/2014 16:42   Dg Hip Complete Right  07/27/2014   CLINICAL DATA:  Fall.  Right hip pain 1 day  EXAM: RIGHT HIP - COMPLETE 2+ VIEW  COMPARISON:  None.  FINDINGS: Right femoral neck fracture with mild  displacement. Mild joint space narrowing of the right hip joint. No other fracture. Left hip joint is negative  IMPRESSION: Right femoral neck fracture with displacement.   Electronically Signed   By: Franchot Gallo M.D.   On: 07/27/2014 16:40    EKG: Independently reviewed.   Assessment/Plan Principal Problem:   Closed right hip fracture  Active Problems:   DM (diabetes mellitus), type 2, uncontrolled   PAH (pulmonary artery hypertension)   Reflux esophagitis   C. difficile diarrhea   CKD (chronic kidney disease), stage III    Closed right hip fracture -Orthopedics consulted for probable surgical intervention. -Control pain with narcotics, DVT prophylaxis after the surgery with subcutaneous heparin. -Likely to involve social work for potential SNF placement.  Preoperative risk stratification -According to the revised cardiac risk index patient is a class IV risk secondary to CAD, CHF and IDDM. -She is in the moderate to high risk category for developing major cardiac events. -I have asked cardiology to evaluate her prior to surgery, if any optimization of medical therapy can change outcome.  [place] -Patient is on continuous Flolan infusion, patient follows with Dr. Gilles Chiquito in [place]. -Receive continuous IV infusion through 12 catheter in the right side of her chest, continue. -Appears to be controlled, last 2-D echo done in August showed normal systolic pressure in the pulmonary arteries  Diabetes mellitus, insulin-dependent -Patient is on 26 units of insulin, prescribe half of insulin while she is n.p.o. -Check hemoglobin A1c, insulin sliding scale.  Recent history of C. difficile diarrhea -Avoid unnecessary antibiotics and PPIs.  Chronic diastolic CHF -LVF from echo done on 06/05/2014 showed LVEF of 68% and grade 1 diastolic dysfunction. -Patient appears to be euvolemic. -Has chronic baseline of shortness of breath likely secondary to chronic diastolic CHF and severe  PAH.  Code Status: Full code Family Communication: Plan discussed with the patient in the presence of her husband at bedside. Disposition Plan: Telemetry.  Time spent: 70 minutes  [facility] Pager [phone]

## 2014-07-28 ENCOUNTER — Encounter (HOSPITAL_COMMUNITY): Payer: Self-pay | Admitting: General Practice

## 2014-07-28 DIAGNOSIS — Z01810 Encounter for preprocedural cardiovascular examination: Secondary | ICD-10-CM

## 2014-07-28 DIAGNOSIS — I5033 Acute on chronic diastolic (congestive) heart failure: Secondary | ICD-10-CM

## 2014-07-28 LAB — CBC
HCT: 28.4 % — ABNORMAL LOW (ref 36.0–46.0)
Hemoglobin: 9.3 g/dL — ABNORMAL LOW (ref 12.0–15.0)
MCH: 27.5 pg (ref 26.0–34.0)
MCHC: 32.7 g/dL (ref 30.0–36.0)
MCV: 84 fL (ref 78.0–100.0)
Platelets: 278 10*3/uL (ref 150–400)
RBC: 3.38 MIL/uL — ABNORMAL LOW (ref 3.87–5.11)
RDW: 13.2 % (ref 11.5–15.5)
WBC: 15.9 10*3/uL — AB (ref 4.0–10.5)

## 2014-07-28 LAB — HEMOGLOBIN A1C
HEMOGLOBIN A1C: 6.3 % — AB (ref <5.7)
Mean Plasma Glucose: 134 mg/dL — ABNORMAL HIGH (ref <117)

## 2014-07-28 LAB — GLUCOSE, CAPILLARY
GLUCOSE-CAPILLARY: 89 mg/dL (ref 70–99)
GLUCOSE-CAPILLARY: 200 mg/dL — AB (ref 70–99)
GLUCOSE-CAPILLARY: 191 mg/dL — AB (ref 70–99)
Glucose-Capillary: 129 mg/dL — ABNORMAL HIGH (ref 70–99)

## 2014-07-28 LAB — BASIC METABOLIC PANEL
Anion gap: 11 (ref 5–15)
BUN: 10 mg/dL (ref 6–23)
CO2: 32 mEq/L (ref 19–32)
Calcium: 8.5 mg/dL (ref 8.4–10.5)
Chloride: 97 mEq/L (ref 96–112)
Creatinine, Ser: 0.84 mg/dL (ref 0.50–1.10)
GFR, EST AFRICAN AMERICAN: 80 mL/min — AB (ref >90)
GFR, EST NON AFRICAN AMERICAN: 69 mL/min — AB (ref >90)
GLUCOSE: 44 mg/dL — AB (ref 70–99)
Potassium: 3.2 mEq/L — ABNORMAL LOW (ref 3.7–5.3)
Sodium: 140 mEq/L (ref 137–147)

## 2014-07-28 LAB — SURGICAL PCR SCREEN
MRSA, PCR: NEGATIVE
Staphylococcus aureus: POSITIVE — AB

## 2014-07-28 MED ORDER — MUPIROCIN 2 % EX OINT
1.0000 "application " | TOPICAL_OINTMENT | Freq: Two times a day (BID) | CUTANEOUS | Status: DC
  Administered 2014-07-28 – 2014-07-30 (×6): 1 via NASAL
  Filled 2014-07-28 (×2): qty 22

## 2014-07-28 MED ORDER — OXYGEN PERMEABLE LENS PRODUCTS SOLN
6.0000 L | Status: DC
Start: 2014-07-28 — End: 2014-07-28

## 2014-07-28 MED ORDER — CETYLPYRIDINIUM CHLORIDE 0.05 % MT LIQD
7.0000 mL | Freq: Two times a day (BID) | OROMUCOSAL | Status: DC
  Administered 2014-07-28 – 2014-07-30 (×4): 7 mL via OROMUCOSAL

## 2014-07-28 MED ORDER — ONDANSETRON HCL 4 MG/2ML IJ SOLN
4.0000 mg | Freq: Four times a day (QID) | INTRAMUSCULAR | Status: DC | PRN
  Administered 2014-07-29: 4 mg via INTRAVENOUS
  Filled 2014-07-28 (×2): qty 2

## 2014-07-28 MED ORDER — PH 12 STERILE DILUENT
33.4500 ng/kg/min | INTRAVENOUS | Status: DC
  Administered 2014-07-28: 33.45 ng/kg/min via INTRAVENOUS
  Filled 2014-07-28: qty 15

## 2014-07-28 MED ORDER — EZETIMIBE-SIMVASTATIN 10-40 MG PO TABS
1.0000 | ORAL_TABLET | Freq: Every day | ORAL | Status: DC
  Administered 2014-07-28 – 2014-08-02 (×5): 1 via ORAL
  Filled 2014-07-28 (×7): qty 1

## 2014-07-28 MED ORDER — FUROSEMIDE 80 MG PO TABS
80.0000 mg | ORAL_TABLET | Freq: Every day | ORAL | Status: DC
  Administered 2014-07-29 – 2014-08-03 (×6): 80 mg via ORAL
  Filled 2014-07-28 (×6): qty 1

## 2014-07-28 MED ORDER — SALINE SPRAY 0.65 % NA SOLN
1.0000 | NASAL | Status: DC | PRN
  Filled 2014-07-28: qty 44

## 2014-07-28 MED ORDER — DEXTROSE 50 % IV SOLN
25.0000 mL | Freq: Once | INTRAVENOUS | Status: AC | PRN
Start: 2014-07-28 — End: 2014-07-28
  Administered 2014-07-28: 25 mL via INTRAVENOUS

## 2014-07-28 MED ORDER — POTASSIUM CHLORIDE CRYS ER 20 MEQ PO TBCR
40.0000 meq | EXTENDED_RELEASE_TABLET | Freq: Once | ORAL | Status: AC
  Administered 2014-07-28: 40 meq via ORAL
  Filled 2014-07-28: qty 2

## 2014-07-28 MED ORDER — POTASSIUM CHLORIDE CRYS ER 20 MEQ PO TBCR
40.0000 meq | EXTENDED_RELEASE_TABLET | Freq: Every day | ORAL | Status: DC
  Administered 2014-07-28 – 2014-08-03 (×7): 40 meq via ORAL
  Filled 2014-07-28 (×8): qty 2

## 2014-07-28 MED ORDER — LEVOTHYROXINE SODIUM 25 MCG PO TABS
25.0000 ug | ORAL_TABLET | Freq: Every day | ORAL | Status: DC
  Administered 2014-07-29 – 2014-08-03 (×5): 25 ug via ORAL
  Filled 2014-07-28 (×7): qty 1

## 2014-07-28 MED ORDER — INSULIN GLARGINE 100 UNIT/ML ~~LOC~~ SOLN
8.0000 [IU] | Freq: Every morning | SUBCUTANEOUS | Status: DC
  Filled 2014-07-28: qty 0.08

## 2014-07-28 MED ORDER — ALPRAZOLAM 0.5 MG PO TABS
1.0000 mg | ORAL_TABLET | Freq: Every day | ORAL | Status: DC
Start: 2014-07-28 — End: 2014-07-28

## 2014-07-28 MED ORDER — LEVOTHYROXINE SODIUM 100 MCG IV SOLR
12.5000 ug | Freq: Once | INTRAVENOUS | Status: AC
  Administered 2014-07-28: 12.5 ug via INTRAVENOUS
  Filled 2014-07-28: qty 5

## 2014-07-28 MED ORDER — DEXTROSE 50 % IV SOLN
INTRAVENOUS | Status: AC
  Filled 2014-07-28: qty 50

## 2014-07-28 MED ORDER — DEXTROSE 50 % IV SOLN: 50.0000 mL | Freq: Once | INTRAVENOUS | Status: AC | PRN

## 2014-07-28 MED ORDER — INSULIN ASPART 100 UNIT/ML ~~LOC~~ SOLN
0.0000 [IU] | Freq: Three times a day (TID) | SUBCUTANEOUS | Status: DC
  Administered 2014-07-28 – 2014-07-29 (×2): 2 [IU] via SUBCUTANEOUS

## 2014-07-28 MED ORDER — EZETIMIBE-SIMVASTATIN 10-40 MG PO TABS
1.0000 | ORAL_TABLET | Freq: Every evening | ORAL | Status: DC
  Filled 2014-07-28: qty 1

## 2014-07-28 MED ORDER — ALPRAZOLAM 0.5 MG PO TABS: 0.5000 mg | ORAL_TABLET | Freq: Two times a day (BID) | ORAL | Status: DC | PRN

## 2014-07-28 MED ORDER — SODIUM CHLORIDE 0.9 % IV SOLN: 12.5000 ug/h | Freq: Once | INTRAVENOUS | Status: DC

## 2014-07-28 MED ORDER — CHLORHEXIDINE GLUCONATE CLOTH 2 % EX PADS
6.0000 | MEDICATED_PAD | Freq: Every day | CUTANEOUS | Status: DC
  Administered 2014-07-28 – 2014-07-30 (×3): 6 via TOPICAL

## 2014-07-28 MED ORDER — HYDROMORPHONE HCL 1 MG/ML IJ SOLN
1.0000 mg | INTRAMUSCULAR | Status: DC | PRN
  Administered 2014-07-28 – 2014-07-29 (×8): 2 mg via INTRAVENOUS
  Filled 2014-07-28 (×8): qty 2

## 2014-07-28 MED ORDER — NASAL SPRAY 0.05 % NA SOLN: 1.0000 | NASAL | Status: DC | PRN

## 2014-07-28 NOTE — Progress Notes (Signed)
PROGRESS NOTE  Meagan [name] QVZ:[id number] DOB: 1946/05/07 DOA: 07/27/2014 PCP: Meagan [name]., MD  Meagan [name] is a 68 y.o. female with past medical history of CHF, severe PAH on continuous Flolan infusion, recent C. difficile colitis, history of MI and IDDM. She was brought to the hospital after she fell and broke her right hip. Patient said this morning she was getting ready to see her urologist, she got out of the bathroom and she fell.  She experienced severe right hip pain, and couldn't get up.  She was brought to the hospital for further evaluation. Patient denies any shortness of breath more than usual.  Per her husband she's been unsteady since discharge from the hospital recently for BRBPR and C. difficile colitis.  In the ED x-ray of the right hip showed right femoral neck fracture with displacement, orthopedics Dr. Erlinda Hong consulted.    Assessment/Plan:  Closed right hip fracture.   OR potentially planned for 10/8 Appreciate Cardiology and CHF consultations for clearance. Requiring dilaudid 2 mg q 2 hours for pain.  She regularly takes 4 mg dilaudid po bid at home for fibromyalgia.  Pulmonary Hypertension -Per CHF team her HF/PAH is stable. -Patient is on continuous Flolan infusion, patient follows with Dr. Gilles Chiquito in [place].  -Receive continuous IV infusion through 12 catheter in the right side of her chest, continue.  -Appears to be controlled, last 2-D echo done in August showed normal systolic pressure in the pulmonary arteries  Diabetes mellitus, insulin-dependent  -Patient is on 12 units of insulin and SSI - Moderate. -hemoglobin A1c 6.3  Recent history of C. difficile diarrhea  -Avoid unnecessary antibiotics and PPIs.   Chronic diastolic CHF  -LVF from echo done on 06/05/2014 showed LVEF of 68% and grade 1 diastolic dysfunction.  -Patient appears to be euvolemic.  -Has chronic baseline of shortness of breath likely secondary to chronic diastolic CHF and  severe PAH. -Continue lasix, imdur, metoprolol  Normocytic anemia Baseline hgb appears to be 11 - 12. (declining since August with 2 hospitalizations.) No frank bleeding. Will monitor.  Leukocytosis -? Stress response - No evidence of UTI or pneumonia. Recent history of C. difficile colitis-however no diarrhea. Continue to monitor off antibiotics.  Chronic respiratory failure on home oxygen/severe pulmonary hypertension/restrictive lung disease - Continue oxygen and outpatient followup with [facility].  Hx Right parenchymal renal mass - Reviewed her last discharge summary done on 06/09/14-talk to be a slow-growing renal neoplasm. Has had urology evaluation during that admission, apparently has had outpatient urology followup as well.   DVT Prophylaxis:  heparin  Code Status: full Family Communication: husband at bedside. Disposition Plan: likely SNF at discharge.   Consultants:  Orthopedic surgery  Cardiology   CHF  Procedures:    Antibiotics: Anti-infectives   None        HPI/Subjective: Patient complaining of pain.   Objective: Filed Vitals:   07/28/14 0800 07/28/14 1129 07/28/14 1411 07/28/14 1412  BP:  115/35 92/37 99/32   Pulse:  90 87   Temp:  99.3 F (37.4 C) 98.7 F (37.1 C)   TempSrc:  Oral Oral   Resp: 16 16 16    Height:      Weight:      SpO2:  97% 99%     Intake/Output Summary (Last 24 hours) at 07/28/14 1453 Last data filed at 07/28/14 1418  Gross per 24 hour  Intake   1120 ml  Output      0 ml  Net  1120 ml   Filed Weights   07/27/14 1502 07/27/14 2010  Weight: 68.04 kg (150 lb) 66.4 kg (146 lb 6.2 oz)    Exam: General: elderly female, lying in bed complaining of pain, husband at bedside. On oxygen N/C, husband at bedside. HEENT:  EOMI, Anicteic Sclera, MMM. No pharyngeal erythema or exudates  Neck: Supple, no JVD, no masses  Cardiovascular: RRR, S1 S2 auscultated, no rubs, or gallops.   Respiratory: Clear to auscultation  bilaterally with equal chest rise.  On oxygen Abdomen: Obese, Soft, nontender, nondistended, + bowel sounds  Extremities: warm dry without cyanosis clubbing or edema.  Neuro: AAOx3, cranial nerves grossly intact.  Skin: Without rashes exudates or nodules.   Psych: Normal affect and demeanor with intact judgement and insight  Data Reviewed: Basic Metabolic Panel:  Recent Labs Lab 07/27/14 1530 07/28/14 0525  NA 137 140  K 3.6* 3.2*  CL 94* 97  CO2 31 32  GLUCOSE 85 44*  BUN 11 10  CREATININE 1.02 0.84  CALCIUM 8.6 8.5   CBC:  Recent Labs Lab 07/27/14 1530 07/28/14 0525  WBC 20.9* 15.9*  NEUTROABS 19.0*  --   HGB 10.4* 9.3*  HCT 32.4* 28.4*  MCV 86.2 84.0  PLT 307 278   BNP (last 3 results)  Recent Labs  02/23/14 1006  PROBNP 803.2*   CBG:  Recent Labs Lab 07/27/14 2213 07/28/14 0752 07/28/14 1154  GLUCAP 84 89 200*    Recent Results (from the past 240 hour(s))  SURGICAL PCR SCREEN     Status: Abnormal   Collection Time    07/28/14  2:42 AM      Result Value Ref Range Status   MRSA, PCR NEGATIVE  NEGATIVE Final   Staphylococcus aureus POSITIVE (*) NEGATIVE Final   Comment:            The Xpert SA Assay (FDA     approved for NASAL specimens     in patients over 42 years of age),     is one component of     a comprehensive surveillance     program.  Test performance has     been validated by [employer] for patients greater     than or equal to 64 year old.     It is not intended     to diagnose infection nor to     guide or monitor treatment.     Studies: Dg Chest 1 View  07/27/2014   CLINICAL DATA:  Fall.  Right hip pain  EXAM: CHEST - 1 VIEW  COMPARISON:  06/04/2014  FINDINGS: Prior CABG. Central venous catheter tip in the right atrium unchanged.  Diffuse bilateral airspace disease shows mild progression and may represent edema or diffuse pneumonia. Small pleural effusions.  IMPRESSION: Diffuse bilateral airspace disease suggestive of  pulmonary edema.   Electronically Signed   By: Franchot Gallo M.D.   On: 07/27/2014 16:42   Dg Hip Complete Right  07/27/2014   CLINICAL DATA:  Fall.  Right hip pain 1 day  EXAM: RIGHT HIP - COMPLETE 2+ VIEW  COMPARISON:  None.  FINDINGS: Right femoral neck fracture with mild displacement. Mild joint space narrowing of the right hip joint. No other fracture. Left hip joint is negative  IMPRESSION: Right femoral neck fracture with displacement.   Electronically Signed   By: Franchot Gallo M.D.   On: 07/27/2014 16:40    Scheduled Meds: . ALPRAZolam  1 mg  Oral QHS  . antiseptic oral rinse  7 mL Mouth Rinse BID  . aspirin EC  81 mg Oral Daily  . Chlorhexidine Gluconate Cloth  6 each Topical Daily  . DULoxetine  30 mg Oral QHS  . DULoxetine  60 mg Oral Daily  . ezetimibe-simvastatin  1 tablet Oral QHS  . [START ON 07/29/2014] furosemide  80 mg Oral Daily  . heparin  5,000 Units Subcutaneous 3 times per day  . ice pack  1 each Other 4 times per day  . insulin aspart  0-15 Units Subcutaneous TID WC  . insulin glargine  12 Units Subcutaneous q morning - 10a  . isosorbide mononitrate  90 mg Oral q morning - 10a  . [START ON 07/29/2014] levothyroxine  25 mcg Oral QAC breakfast  . metoprolol succinate  100 mg Oral Daily  . mupirocin ointment  1 application Nasal BID  . ondansetron  8 mg Oral BID  . potassium chloride SA  40 mEq Oral Daily  . potassium chloride  40 mEq Oral Once   Continuous Infusions: . epoprostenol      Principal Problem:   Closed right hip fracture Active Problems:   DM (diabetes mellitus), type 2, uncontrolled   PAH (pulmonary artery hypertension)   Acute on chronic diastolic heart failure   Reflux esophagitis   C. difficile diarrhea   CKD (chronic kidney disease), stage III    Karen Kitchens  [facility] Pager [phone]. If 7PM-7AM, please contact night-coverage at [email or web address], password [facility] 07/28/2014, 2:53 PM  LOS: 1 day   Attending Patient  seen and examined, agree with the above assessment and plan. Admitted with a right hip fracture, orthopedics planning for procedure on 10/8. Seen by cardiology, appreciate radiology assistance. Patient and family aware of increased risk given significant cardiopulmonary issues. They are willing to proceed.  Nena Alexander MD

## 2014-07-28 NOTE — Progress Notes (Signed)
Hypoglycemic Event  CBG: 44   Treatment: 25 ml D50  Symptoms: Pt A&Ox4 - low blood sugar on lab draw  Follow-up CBG: Time: 0752 CBG Result: 89  Possible Reasons for Event: Pt NPO  Comments/MD notified: Dr. Sloan Leiter [id number]    Meagan [name]  Remember to initiate Hypoglycemia Order Set & complete

## 2014-07-28 NOTE — Progress Notes (Signed)
Pt arrive to unit in no s/s of distress. Pt A&Ox4. Pt oriented to unit and room. Dayshift nurse receive report from ED. Pt has home med infusing into R port - MD aware and pharmacy aware. VS stable. Whiteboard updated. Callbell within reach. Will continue to monitor.

## 2014-07-28 NOTE — Progress Notes (Signed)
Patient is stable. Surgery will not be today.   Tomorrow at the earliest if cardiac issues and flolan can be optimized. Will follow.  Azucena Cecil, MD [place] 7:05 AM

## 2014-07-28 NOTE — Progress Notes (Signed)
Note/chart reviewed.  Katie Luci Bellucci, RD, LDN Pager #: [phone] After-Hours Pager #: [phone]  

## 2014-07-28 NOTE — Progress Notes (Signed)
INITIAL NUTRITION ASSESSMENT  DOCUMENTATION CODES Per approved criteria  -Not Applicable   INTERVENTION: Provide Magic cup BID between meals, each supplement provides 290 kcal and 9 grams of protein.  NUTRITION DIAGNOSIS: Inadequate oral intake related to decreased appetite as evidenced by pt report and meal completion of 25-50%.   Goal: Pt to meet >/= 90% of their estimated nutrition needs   Monitor:  PO intake, weight trends, labs  Reason for Assessment: MD consult  68 y.o. female  Admitting Dx: Closed right hip fracture  ASSESSMENT: Pt with past medical history of CHF, severe PAH on continuous Flolan infusion, recent C. difficile colitis, history of MI and IDDM. Patient brought to the hospital after she fell and broke her right hip.  Plan for surgery tomorrow at the earliest per MD note.  Pt reports having a decreased appetite which has been present over the past month. Current meal completion reported by pt is 25-50%. Diet recall consists of toast and eggs for breakfast, a cheese sandwich at lunch, and a light snack/meal at dinner. Even though pt has a decreased appetite, she still eats 3 meals a day. Prior to her decreased appetite, she reports eating very well with 3 full meals a day. Pt reports her usual body weight is 150 lbs. Pt with a 3% weight loss in 1 week. Pt refused most oral supplements, except magic cup. Will order.   Pt with no significant fat or muscle mass loss.   Labs: Low potassium and GFR.   Height: Ht Readings from Last 1 Encounters:  07/27/14 5' (1.524 m)    Weight: Wt Readings from Last 1 Encounters:  07/27/14 146 lb 6.2 oz (66.4 kg)    Ideal Body Weight: 100 lbs  % Ideal Body Weight: 146%  Wt Readings from Last 10 Encounters:  07/27/14 146 lb 6.2 oz (66.4 kg)  07/19/14 150 lb (68.04 kg)  06/08/14 155 lb 3.3 oz (70.4 kg)  06/08/14 155 lb 3.3 oz (70.4 kg)  03/04/14 154 lb (69.854 kg)  02/25/14 150 lb 5.7 oz (68.2 kg)  02/25/14 150 lb  5.7 oz (68.2 kg)  01/06/14 152 lb 12.8 oz (69.31 kg)  10/06/13 151 lb 12 oz (68.833 kg)  05/25/13 144 lb 12.8 oz (65.681 kg)    Usual Body Weight: 150 lbs  % Usual Body Weight: 97%  BMI:  Body mass index is 28.59 kg/(m^2).  Estimated Nutritional Needs: Kcal: 1700-1900 Protein: 75-90 grams Fluid: 1.7 - 1.9 L/day  Skin: LE edema  Diet Order: Cardiac  EDUCATION NEEDS: -No education needs identified at this time   Intake/Output Summary (Last 24 hours) at 07/28/14 1009 Last data filed at 07/27/14 1927  Gross per 24 hour  Intake   1000 ml  Output      0 ml  Net   1000 ml    Last BM: 10/6  Labs:   Recent Labs Lab 07/27/14 1530 07/28/14 0525  NA 137 140  K 3.6* 3.2*  CL 94* 97  CO2 31 32  BUN 11 10  CREATININE 1.02 0.84  CALCIUM 8.6 8.5  GLUCOSE 85 44*    CBG (last 3)   Recent Labs  07/27/14 2213 07/28/14 0752  GLUCAP 84 89    Scheduled Meds: . ALPRAZolam  1 mg Oral QHS  . antiseptic oral rinse  7 mL Mouth Rinse BID  . aspirin EC  81 mg Oral Daily  . Chlorhexidine Gluconate Cloth  6 each Topical Daily  . DULoxetine  30 mg  Oral QHS  . DULoxetine  60 mg Oral Daily  . ezetimibe-simvastatin  1 tablet Oral QHS  . furosemide  80 mg Intravenous BID  . heparin  5,000 Units Subcutaneous 3 times per day  . ice pack  1 each Other 4 times per day  . insulin aspart  0-9 Units Subcutaneous TID WC  . insulin glargine  12 Units Subcutaneous q morning - 10a  . isosorbide mononitrate  90 mg Oral q morning - 10a  . [START ON 07/29/2014] levothyroxine  25 mcg Oral QAC breakfast  . metoprolol succinate  100 mg Oral Daily  . mupirocin ointment  1 application Nasal BID  . ondansetron  8 mg Oral BID  . potassium chloride SA  40 mEq Oral Daily    Continuous Infusions: . epoprostenol 33.45 ng/kg/min (07/27/14 2230)    Past Medical History  Diagnosis Date  . Fibromyalgia   . Hypercholesterolemia   . Hypertension     pulmonary  pap 110/31 (mean 62)by RHC 9/10  negative PE by chest CT 2010  . Pulmonary edema     Failed Revatio   . ARF (acute renal failure)     poor candidate for ACE -I or ARB  . Restrictive lung disease     obesity hypoventilation syndrome  . Renal insufficiency   . CAD (coronary artery disease)     multivessel DES x 2 RCA 2007  . Chronic diastolic heart failure   . MRSA (methicillin resistant staphylococcus aureus) pneumonia   . Pulmonary hypertension   . Chronic anemia     anemia of chronic disease based on anemia panel 2011  . Pancreatic mass 08/01/2012  . PONV (postoperative nausea and vomiting)   . Heart murmur     "when I was small"  . CHF (congestive heart failure)   . Anginal pain   . Pneumonia X 3  . Type II diabetes mellitus   . Hypothyroidism   . H/O hiatal hernia   . GERD (gastroesophageal reflux disease)   . Anxiety   . On home oxygen therapy     "6L all the time" (02/24/2014    Past Surgical History  Procedure Laterality Date  . Coronary artery bypass graft  1997    LIMA to LAD,SVG to OM  . Esophagogastroduodenoscopy  06/28/10    schatzki ring otherwise normal;small hiatal hernia  . Coronary angioplasty  1993  . Coronary angioplasty with stent placement  ~ 1996; 2008    "think I have 3 stents"  . Abdominal hysterectomy  ~ 1986  . Dilation and curettage of uterus  ~ 1984  . Tubal ligation    . Esophagogastroduodenoscopy N/A 06/06/2014    Procedure: ESOPHAGOGASTRODUODENOSCOPY (EGD);  Surgeon: Lafayette Dragon, MD;  Location: [facility] ENDOSCOPY;  Service: Endoscopy;  Laterality: N/A;    Kallie Locks, MS, Provisional LDN Pager # [phone] After hours/ weekend pager # [phone]

## 2014-07-28 NOTE — Consult Note (Signed)
Advanced Heart Failure Team Consult Note  Referring Physician: Dr Lovena Neighbours Primary Physician: Primary Cardiologist:  Dr Haroldine Laws  Reason for Consultation: Pulmonary Hypertension  HPI:   Meagan [name] is a 68 y/o woman with multiple medical problems including CAD s/p CABG and RCA stent, DM2, obesity, fibromyalgia, diastolic HF. She has severe PAH and is on Flolan 38.3 ng/kg/min followed by Dr. Gilles Chiquito at [facility]. She was last admitted with upper GI bleeding as well as C difficile diarrhea.   We are asked to consult for pulmonary hypertension and cardiac clearance for surgery.   Prior to admit complaining of ongoing fatigue. Dyspnea with exertion. Weight at home 150 pounds. Denies syncope. Says she fell but not sure what she tripped on. Her husband manages flolan changes dressing and bag nightly.   Admitted after a fall.  Ortho consulted. Has R hip fracture. Complaining of hip pain. Breathing at baseline.    Last RHC 5/15 with markedly improved pulmonary pressures and normal cardiac output  RA = 9  RV = 67/5/10  PA = 71/21 (39)  PCW = 13  Fick cardiac output/index = 5.5/3.1  PVR = 4.6 [place]  FA sat = 94%  PA sat = 63%,64%      Review of Systems: [y] = yes, [ ]  = no   General: Weight gain [ ] ; Weight loss [ ] ; Anorexia [ ] ; Fatigue [Y ]; Fever [ ] ; Chills [ ] ; Weakness [ ]   Cardiac: Chest pain/pressure [ ] ; Resting SOB [ ] ; Exertional SOB [Y ]; Orthopnea [ ] ; Pedal Edema [ ] ; Palpitations [ ] ; Syncope [ ] ; Presyncope [ ] ; Paroxysmal nocturnal dyspnea[ ]   Pulmonary: Cough [ ] ; Wheezing[ ] ; Hemoptysis[ ] ; Sputum [ ] ; Snoring [ ]   GI: Vomiting[ ] ; Dysphagia[ ] ; Melena[ ] ; Hematochezia [ ] ; Heartburn[ ] ; Abdominal pain [ ] ; Constipation [ ] ; Diarrhea [ Y]; BRBPR [ ]   GU: Hematuria[ ] ; Dysuria [ ] ; Nocturia[ ]   Vascular: Pain in legs with walking [ ] ; Pain in feet with lying flat [ ] ; Non-healing sores [ ] ; Stroke [ ] ; TIA [ ] ; Slurred speech [ ] ;  Neuro: Headaches[ ] ; Vertigo[ ] ; Seizures[  ]; Paresthesias[ ] ;Blurred vision [ ] ; Diplopia [ ] ; Vision changes [ ]   Ortho/Skin: Arthritis [ ] ; Joint pain [Y ]; Muscle pain [ Y]; Joint swelling [ ] ; Back Pain [ ] ; Rash [ ]   Psych: Depression[Y ]; Anxiety[ ]   Heme: Bleeding problems [ ] ; Clotting disorders [ ] ; Anemia [ ]   Endocrine: Diabetes [Y ]; Thyroid dysfunction[ ]   Home Medications Prior to Admission medications   Medication Sig Start Date End Date Taking? Authorizing Provider  ALPRAZolam Duanne Moron) 0.5 MG tablet Take 1 mg by mouth at bedtime. For sleep. *Prescribed one tablet three times daily as needed*   Yes Historical Provider, MD  aspirin EC 81 MG EC tablet Take 1 tablet (81 mg total) by mouth daily. 06/09/14  Yes Modena Jansky, MD  DULoxetine (CYMBALTA) 30 MG capsule Take 30 mg by mouth at bedtime.    Yes Historical Provider, MD  DULoxetine (CYMBALTA) 60 MG capsule Take 60 mg by mouth every morning.    Yes Historical Provider, MD  Epoprostenol Sodium (FLOLAN IV) Inject into the vein. 1.5 mg. Inject into the vein continuously. Infuse mg/kg/min via cont IV pump.   Yes Historical Provider, MD  ezetimibe-simvastatin (VYTORIN) 10-40 MG per tablet Take 1 tablet by mouth every evening.   Yes Historical Provider, MD  furosemide (LASIX) 40 MG tablet Take  80 mg by mouth daily.    Yes Historical Provider, MD  HYDROmorphone (DILAUDID) 2 MG tablet Take 4 mg by mouth 2 (two) times daily.    Yes Historical Provider, MD  insulin glargine (LANTUS) 100 UNIT/ML injection Inject 0.12 mLs (12 Units total) into the skin every morning. 06/09/14  Yes Modena Jansky, MD  isosorbide mononitrate (IMDUR) 60 MG 24 hr tablet Take 90 mg by mouth every morning.    Yes Historical Provider, MD  levothyroxine (SYNTHROID, LEVOTHROID) 25 MCG tablet Take 25 mcg by mouth every morning.    Yes Historical Provider, MD  metoprolol (TOPROL-XL) 100 MG 24 hr tablet Take 100 mg by mouth every morning.    Yes Historical Provider, MD  omeprazole (PRILOSEC) 20 MG capsule  Take 20 mg by mouth 2 (two) times daily.     Yes Historical Provider, MD  ondansetron (ZOFRAN) 8 MG tablet Take 8 mg by mouth 2 (two) times daily. For nausea   Yes Historical Provider, MD  Oxygen Permeable Lens Products SOLN Inhale into the lungs continuous. 6 liters 12/31/11  Yes Jolaine Artist, MD  Oxymetazoline HCl (NASAL SPRAY) 0.05 % SOLN Place 1 spray into the nose as needed (for congestion).   Yes Historical Provider, MD  potassium chloride SA (K-DUR,KLOR-CON) 20 MEQ tablet Take 40 mEq by mouth daily.  09/02/12  Yes Historical Provider, MD  Probiotic Product (FLORAJEN3 PO) Take 1 tablet by mouth daily.   Yes Historical Provider, MD  traMADol (ULTRAM) 50 MG tablet Take 50 mg by mouth every 3 (three) hours as needed. For pain. *takes with Dilaudid (Hydromorphone) 04/22/12  Yes Historical Provider, MD  VYTORIN 10-40 MG per tablet TAKE ONE TABLET BY MOUTH AT BEDTIME 07/05/14  Yes Jolaine Artist, MD  nitroGLYCERIN (NITROSTAT) 0.4 MG SL tablet Place 0.4 mg under the tongue every 5 (five) minutes as needed for chest pain.     Historical Provider, MD    Past Medical History: Past Medical History  Diagnosis Date  . Fibromyalgia   . Hypercholesterolemia   . Hypertension     pulmonary  pap 110/31 (mean 62)by RHC 9/10 negative PE by chest CT 2010  . Pulmonary edema     Failed Revatio   . ARF (acute renal failure)     poor candidate for ACE -I or ARB  . Restrictive lung disease     obesity hypoventilation syndrome  . Renal insufficiency   . CAD (coronary artery disease)     multivessel DES x 2 RCA 2007  . Chronic diastolic heart failure   . MRSA (methicillin resistant staphylococcus aureus) pneumonia   . Pulmonary hypertension   . Chronic anemia     anemia of chronic disease based on anemia panel 2011  . Pancreatic mass 08/01/2012  . PONV (postoperative nausea and vomiting)   . Heart murmur     "when I was small"  . CHF (congestive heart failure)   . Anginal pain   . Pneumonia X 3   . Type II diabetes mellitus   . Hypothyroidism   . H/O hiatal hernia   . GERD (gastroesophageal reflux disease)   . Anxiety   . On home oxygen therapy     "6L all the time" (02/24/2014    Past Surgical History: Past Surgical History  Procedure Laterality Date  . Coronary artery bypass graft  1997    LIMA to LAD,SVG to OM  . Esophagogastroduodenoscopy  06/28/10    schatzki ring otherwise normal;small hiatal  hernia  . Coronary angioplasty  1993  . Coronary angioplasty with stent placement  ~ 1996; 2008    "think I have 3 stents"  . Abdominal hysterectomy  ~ 1986  . Dilation and curettage of uterus  ~ 1984  . Tubal ligation    . Esophagogastroduodenoscopy N/A 06/06/2014    Procedure: ESOPHAGOGASTRODUODENOSCOPY (EGD);  Surgeon: Lafayette Dragon, MD;  Location[facility] ENDOSCOPY;  Service: Endoscopy;  Laterality: N/A;    Family History: Family History  Problem Relation Age of Onset  . Cancer Mother     oral  . Heart attack Father   . COPD Brother   . Heart disease Brother     Social History: History   Social History  . Marital Status: Married    Spouse Name: N/A    Number of Children: N/A  . Years of Education: N/A   Social History Main Topics  . Smoking status: Never Smoker   . Smokeless tobacco: Never Used  . Alcohol Use: No  . Drug Use: No  . Sexual Activity: Not Currently   Other Topics Concern  . None   Social History Narrative  . None    Allergies:  Allergies  Allergen Reactions  . Aspirin     REACTION: Intolerance  . Codeine Other (See Comments)    Messes with digestive system  . Niacin Other (See Comments)    Pins sticking into skin  . Nsaids Other (See Comments)    REACTION: Aggravates Digestive System   . Phenergan Fortis [Promethazine] Other (See Comments)    Family and pt states becomes very confused and wild.   . Sulfonamide Derivatives Other (See Comments)    Messes with digestive system    Objective:    Vital Signs:   Temp:  [97.9 F (36.6  C)-98.9 F (37.2 C)] 97.9 F (36.6 C) (10/07 0534) Pulse Rate:  [73-86] 76 (10/07 0534) Resp:  [16-20] 16 (10/07 0534) BP: (109-140)/(44-74) 110/55 mmHg (10/07 0544) SpO2:  [96 %-100 %] 99 % (10/07 0534) Weight:  [146 lb 6.2 oz (66.4 kg)-150 lb (68.04 kg)] 146 lb 6.2 oz (66.4 kg) (10/06 2010) Last BM Date: 07/27/14  Weight change: Filed Weights   07/27/14 1502 07/27/14 2010  Weight: 150 lb (68.04 kg) 146 lb 6.2 oz (66.4 kg)    Intake/Output:   Intake/Output Summary (Last 24 hours) at 07/28/14 0859 Last data filed at 07/27/14 1927  Gross per 24 hour  Intake   1000 ml  Output      0 ml  Net   1000 ml     Physical Exam:  General: Chronically-ill appearing. Lying flat in bed  Wearing [place] O2 Husband at bedside.  HEENT: normal  Neck: thick . JVP ~6-7 Carotids 2+ bilat; no bruits. No lymphadenopathy or thryomegaly appreciated.  Cor: PMI nonpalpable Regular tachy. 2/6 TR. P2 perhaps mildly accentuated but not severely so  Hickman catheter ok  Lungs: clear but decreased throughout no wheezing  Abdomen: obese. soft, nontender, nondistended. No hepatosplenomegaly. No bruits or masses. hypoactivends.  Extremities: no cyanosis, clubbing  Neuro: alert & oriented x 3, cranial nerves grossly intact. moves all 4 extremities w/o difficulty. Affect pleasant.        Labs: Basic Metabolic Panel:  Recent Labs Lab 07/27/14 1530 07/28/14 0525  NA 137 140  K 3.6* 3.2*  CL 94* 97  CO2 31 32  GLUCOSE 85 44*  BUN 11 10  CREATININE 1.02 0.84  CALCIUM 8.6 8.5    Liver  Function Tests: No results found for this basename: AST, ALT, ALKPHOS, BILITOT, PROT, ALBUMIN,  in the last 168 hours No results found for this basename: LIPASE, AMYLASE,  in the last 168 hours No results found for this basename: AMMONIA,  in the last 168 hours  CBC:  Recent Labs Lab 07/27/14 1530 07/28/14 0525  WBC 20.9* 15.9*  NEUTROABS 19.0*  --   HGB 10.4* 9.3*  HCT 32.4* 28.4*  MCV 86.2 84.0  PLT 307  278    Cardiac Enzymes: No results found for this basename: CKTOTAL, CKMB, CKMBINDEX, TROPONINI,  in the last 168 hours  BNP: BNP (last 3 results)  Recent Labs  02/23/14 1006  PROBNP 803.2*    CBG:  Recent Labs Lab 07/27/14 2213 07/28/14 0752  GLUCAP 84 89    Coagulation Studies: No results found for this basename: LABPROT, INR,  in the last 72 hours  Other results: EKG: NSR 86 bpm .  Imaging: Dg Chest 1 View  07/27/2014   CLINICAL DATA:  Fall.  Right hip pain  EXAM: CHEST - 1 VIEW  COMPARISON:  06/04/2014  FINDINGS: Prior CABG. Central venous catheter tip in the right atrium unchanged.  Diffuse bilateral airspace disease shows mild progression and may represent edema or diffuse pneumonia. Small pleural effusions.  IMPRESSION: Diffuse bilateral airspace disease suggestive of pulmonary edema.   Electronically Signed   By: Franchot Gallo M.D.   On: 07/27/2014 16:42   Dg Hip Complete Right  07/27/2014   CLINICAL DATA:  Fall.  Right hip pain 1 day  EXAM: RIGHT HIP - COMPLETE 2+ VIEW  COMPARISON:  None.  FINDINGS: Right femoral neck fracture with mild displacement. Mild joint space narrowing of the right hip joint. No other fracture. Left hip joint is negative  IMPRESSION: Right femoral neck fracture with displacement.   Electronically Signed   By: Franchot Gallo M.D.   On: 07/27/2014 16:40      Medications:     Current Medications: . ALPRAZolam  1 mg Oral QHS  . antiseptic oral rinse  7 mL Mouth Rinse BID  . aspirin EC  81 mg Oral Daily  . Chlorhexidine Gluconate Cloth  6 each Topical Daily  . DULoxetine  30 mg Oral QHS  . DULoxetine  60 mg Oral Daily  . ezetimibe-simvastatin  1 tablet Oral QPM  . ezetimibe-simvastatin  1 tablet Oral QHS  . furosemide  80 mg Intravenous BID  . furosemide  80 mg Oral Daily  . heparin  5,000 Units Subcutaneous 3 times per day  . HYDROmorphone  4 mg Oral BID  . ice pack  1 each Other 4 times per day  . insulin aspart  0-9 Units  Subcutaneous TID WC  . insulin glargine  12 Units Subcutaneous q morning - 10a  . isosorbide mononitrate  90 mg Oral q morning - 10a  . [START ON 07/29/2014] levothyroxine  25 mcg Oral QAC breakfast  . metoprolol succinate  100 mg Oral Daily  . mupirocin ointment  1 application Nasal BID  . ondansetron  8 mg Oral BID  . potassium chloride  40 mEq Oral Once  . potassium chloride SA  40 mEq Oral Daily     Infusions: . epoprostenol 33.45 ng/kg/min (07/27/14 2230)      Assessment:   1. R hip fracture 2. Severe PAH on  flolan  3. CAD s/p CABG. LAD lesion on last cath does not appear to be flow limiting 4. Chronic Diastolic HF  5. H/O  Of GI bleed from esophagitis 6. Hypokalemia  7. Diarrhea H/O C diff 8. Hypothyroid 9. DM    Plan/Discussion:    From HF/PAH perspective she is stable. Continue current regimen. Her husband will manage flolan and supply medication. Place on home medications.   Currently off plavix. (Last dose was 10/6).   She is at moderate to high risk post op complications. [place] for surgery as long flolan is run continuously without interruption.   Check stool for c diff    Length of Stay: 1  CLEGG,AMY NP-C  07/28/2014, 8:59 AM  Advanced Heart Failure Team Pager [phone] (M-F; [place])  Please contact [facility] for night-coverage after hours (4p -7a ) and weekends on [email or web address]  Patient seen and examined with Darrick Grinder, NP. We discussed all aspects of the encounter. I agree with the assessment and plan as stated above.   Based on her [place] and CAD she is at moderate to high -- but not prohibitive -- risk for peri-operative CV complications. Will need to absolutely continue IV Flolan without interruption to prevent rebound PAH. At family's request I discussed with Dr. Gilles Chiquito in the [facility] at [facility] who agrees.   Daniel Bensimhon,MD 6:09 PM

## 2014-07-28 NOTE — Anesthesia Preprocedure Evaluation (Addendum)
Anesthesia Evaluation  Patient identified by MRN, date of birth, ID band Patient awake    Reviewed: Allergy & Precautions, H&P , NPO status , Patient's Chart, lab work & pertinent test results  Airway Mallampati: II TM Distance: >3 FB     Dental  (+) Teeth Intact   Pulmonary  Pulmonary HTN PA pressure 55. Home Oxygen breath sounds clear to auscultation        Cardiovascular hypertension, Rhythm:Regular Rate:Normal  ECHO 2014 60%, PAH 38mmhg   Neuro/Psych    GI/Hepatic GERD-  Medicated,  Endo/Other  diabetes, Type 2  Renal/GU      Musculoskeletal   Abdominal (+) + obese,   Peds  Hematology H/H 9/28  T&S ordered   Anesthesia Other Findings   Reproductive/Obstetrics                         Anesthesia Physical Anesthesia Plan  ASA: IV  Anesthesia Plan: General   Post-op Pain Management:    Induction: Intravenous  Airway Management Planned: Oral ETT  Additional Equipment:   Intra-op Plan:   Post-operative Plan: Extubation in OR  Informed Consent: I have reviewed the patients History and Physical, chart, labs and discussed the procedure including the risks, benefits and alternatives for the proposed anesthesia with the patient or authorized representative who has indicated his/her understanding and acceptance.     Plan Discussed with:   Anesthesia Plan Comments: ([place] 33mmhg, home oxygen, DM, verify T&S, 2nd IV, possibility of post op ventilation discussed)      Anesthesia Quick Evaluation

## 2014-07-28 NOTE — Care Management Note (Addendum)
    Page 1 of 1   08/04/2014     1:03:44 PM CARE MANAGEMENT NOTE 08/04/2014  Patient:  Meagan [name], Meagan [name]   Account Number:  [id number]  Date Initiated:  07/28/2014  Documentation initiated by:  Tomi Bamberger  Subjective/Objective Assessment:   dx hip fx, pulomonary htn  admit- lives with spouse,  patient is on Flolan infusion thru [place].     Action/Plan:   Anticipated DC Date:  07/31/2014   Anticipated DC Plan:  [place]  CM consult      PAC Choice  IP REHAB   Choice offered to / List presented to:             Status of service:  Completed, signed off Medicare Important Message given?  YES (If response is "NO", the following Medicare IM given date fields will be blank) Date Medicare IM given:  08/02/2014 Medicare IM given by:  HUTCHINSON,CRYSTAL Date Additional Medicare IM given:   Additional Medicare IM given by:    Discharge Disposition:  IP REHAB FACILITY  Per UR Regulation:  Reviewed for med. necessity/level of care/duration of stay  If discussed at [place] of Stay Meetings, dates discussed:    Comments:  Crystal Hutchinson RN, BSN, MSHL, CCM  Nurse - Case Manager,  ([facility])  [phone]  08/04/2014 Disposition:  CIR  07/28/14 [place], BSN 908 [phone] NCM received referral  to make sure patient is set up with home infusion company, NCM spoke with paitent and her spouse, they state they are with [facility] and they have a month supply at home and he helps patient with this at home. NCM also spoke with pharmacist here and she states they will call [facility] if they need to , and that they have been in touch with them already.

## 2014-07-28 NOTE — Progress Notes (Signed)
Surgery is scheduled for tomorrow morning. Dr. Zollie Beckers will be taking over her care.  Patient is in agreement. NPO after midnight. Heparin stopped.  Azucena Cecil, MD [facility] [phone] 4:21 PM

## 2014-07-29 ENCOUNTER — Inpatient Hospital Stay (HOSPITAL_COMMUNITY): Payer: Medicare Other | Admitting: Anesthesiology

## 2014-07-29 ENCOUNTER — Encounter (HOSPITAL_COMMUNITY)
Admission: EM | Disposition: A | Discharge status: Rehabilitation Facility | Payer: Self-pay | Source: Home / Self Care | Attending: Internal Medicine

## 2014-07-29 ENCOUNTER — Encounter (HOSPITAL_COMMUNITY): Payer: Medicare Other | Admitting: Anesthesiology

## 2014-07-29 ENCOUNTER — Encounter (HOSPITAL_COMMUNITY): Payer: Self-pay | Admitting: Anesthesiology

## 2014-07-29 ENCOUNTER — Inpatient Hospital Stay (HOSPITAL_COMMUNITY): Payer: Medicare Other

## 2014-07-29 DIAGNOSIS — I272 Other secondary pulmonary hypertension: Secondary | ICD-10-CM

## 2014-07-29 DIAGNOSIS — J9601 Acute respiratory failure with hypoxia: Secondary | ICD-10-CM

## 2014-07-29 DIAGNOSIS — D649 Anemia, unspecified: Secondary | ICD-10-CM | POA: Diagnosis present

## 2014-07-29 DIAGNOSIS — N2889 Other specified disorders of kidney and ureter: Secondary | ICD-10-CM | POA: Diagnosis present

## 2014-07-29 DIAGNOSIS — A047 Enterocolitis due to Clostridium difficile: Secondary | ICD-10-CM

## 2014-07-29 HISTORY — PX: HIP ARTHROPLASTY: SHX981

## 2014-07-29 LAB — CBC
HCT: 27 % — ABNORMAL LOW (ref 36.0–46.0)
Hemoglobin: 8.9 g/dL — ABNORMAL LOW (ref 12.0–15.0)
MCH: 27.9 pg (ref 26.0–34.0)
MCHC: 33 g/dL (ref 30.0–36.0)
MCV: 84.6 fL (ref 78.0–100.0)
Platelets: 261 10*3/uL (ref 150–400)
RBC: 3.19 MIL/uL — AB (ref 3.87–5.11)
RDW: 13.4 % (ref 11.5–15.5)
WBC: 13.6 10*3/uL — ABNORMAL HIGH (ref 4.0–10.5)

## 2014-07-29 LAB — BASIC METABOLIC PANEL
Anion gap: 9 (ref 5–15)
BUN: 11 mg/dL (ref 6–23)
CO2: 29 mEq/L (ref 19–32)
CREATININE: 0.94 mg/dL (ref 0.50–1.10)
Calcium: 8.4 mg/dL (ref 8.4–10.5)
Chloride: 98 mEq/L (ref 96–112)
GFR calc Af Amer: 70 mL/min — ABNORMAL LOW (ref >90)
GFR, EST NON AFRICAN AMERICAN: 61 mL/min — AB (ref >90)
GLUCOSE: 84 mg/dL (ref 70–99)
POTASSIUM: 4.6 meq/L (ref 3.7–5.3)
Sodium: 136 mEq/L — ABNORMAL LOW (ref 137–147)

## 2014-07-29 LAB — POCT I-STAT 3, ART BLOOD GAS (G3+)
Acid-Base Excess: 11 mmol/L — ABNORMAL HIGH (ref 0.0–2.0)
Acid-Base Excess: 7 mmol/L — ABNORMAL HIGH (ref 0.0–2.0)
BICARBONATE: 31.5 meq/L — AB (ref 20.0–24.0)
Bicarbonate: 34.9 mEq/L — ABNORMAL HIGH (ref 20.0–24.0)
O2 SAT: 100 %
O2 Saturation: 98 %
PCO2 ART: 43.7 mmHg (ref 35.0–45.0)
PH ART: 7.511 — AB (ref 7.350–7.450)
TCO2: 36 mmol/L (ref 0–100)
TCO2: 33 mmol/L (ref 0–100)
pCO2 arterial: 45.7 mmHg — ABNORMAL HIGH (ref 35.0–45.0)
pH, Arterial: 7.447 (ref 7.350–7.450)
pO2, Arterial: 443 mmHg — ABNORMAL HIGH (ref 80.0–100.0)
pO2, Arterial: 110 mmHg — ABNORMAL HIGH (ref 80.0–100.0)

## 2014-07-29 LAB — GLUCOSE, CAPILLARY
GLUCOSE-CAPILLARY: 85 mg/dL (ref 70–99)
GLUCOSE-CAPILLARY: 94 mg/dL (ref 70–99)
GLUCOSE-CAPILLARY: 141 mg/dL — AB (ref 70–99)
Glucose-Capillary: 89 mg/dL (ref 70–99)

## 2014-07-29 LAB — TRIGLYCERIDES: TRIGLYCERIDES: 258 mg/dL — AB (ref <150)

## 2014-07-29 LAB — PREPARE RBC (CROSSMATCH)

## 2014-07-29 SURGERY — HEMIARTHROPLASTY, HIP, DIRECT ANTERIOR APPROACH, FOR FRACTURE
Anesthesia: General | Site: Hip | Laterality: Right

## 2014-07-29 MED ORDER — ONDANSETRON HCL 4 MG/2ML IJ SOLN
4.0000 mg | Freq: Four times a day (QID) | INTRAMUSCULAR | Status: DC | PRN
  Administered 2014-07-30 – 2014-08-01 (×4): 4 mg via INTRAVENOUS
  Filled 2014-07-29 (×3): qty 2

## 2014-07-29 MED ORDER — SUCCINYLCHOLINE CHLORIDE 20 MG/ML IJ SOLN
INTRAMUSCULAR | Status: AC
  Filled 2014-07-29: qty 1

## 2014-07-29 MED ORDER — PROPOFOL 10 MG/ML IV BOLUS
INTRAVENOUS | Status: DC | PRN
  Administered 2014-07-29: 100 mg via INTRAVENOUS
  Administered 2014-07-29: 50 mg via INTRAVENOUS

## 2014-07-29 MED ORDER — FENTANYL CITRATE 0.05 MG/ML IJ SOLN: 50.0000 ug | Freq: Once | INTRAMUSCULAR | Status: DC

## 2014-07-29 MED ORDER — METHOCARBAMOL 500 MG PO TABS
500.0000 mg | ORAL_TABLET | Freq: Four times a day (QID) | ORAL | Status: DC | PRN
  Filled 2014-07-29: qty 1

## 2014-07-29 MED ORDER — HYDROCODONE-ACETAMINOPHEN 5-325 MG PO TABS
1.0000 | ORAL_TABLET | Freq: Four times a day (QID) | ORAL | Status: DC | PRN
  Administered 2014-07-31: 1 via ORAL
  Filled 2014-07-29 (×2): qty 1

## 2014-07-29 MED ORDER — ARTIFICIAL TEARS OP OINT
TOPICAL_OINTMENT | OPHTHALMIC | Status: DC | PRN
  Administered 2014-07-29: 1 via OPHTHALMIC

## 2014-07-29 MED ORDER — MENTHOL 3 MG MT LOZG: 1.0000 | LOZENGE | OROMUCOSAL | Status: DC | PRN

## 2014-07-29 MED ORDER — ONDANSETRON HCL 4 MG/2ML IJ SOLN
INTRAMUSCULAR | Status: AC
  Filled 2014-07-29: qty 2

## 2014-07-29 MED ORDER — SODIUM CHLORIDE 0.9 % IV SOLN
INTRAVENOUS | Status: DC
  Administered 2014-07-29: 17:00:00 via INTRAVENOUS

## 2014-07-29 MED ORDER — FENTANYL CITRATE 0.05 MG/ML IJ SOLN
INTRAMUSCULAR | Status: DC | PRN
  Administered 2014-07-29: 100 ug via INTRAVENOUS
  Administered 2014-07-29: 50 ug via INTRAVENOUS

## 2014-07-29 MED ORDER — MIDAZOLAM HCL 2 MG/2ML IJ SOLN: 1.0000 mg | INTRAMUSCULAR | Status: DC | PRN

## 2014-07-29 MED ORDER — ACETAMINOPHEN 650 MG RE SUPP
650.0000 mg | Freq: Four times a day (QID) | RECTAL | Status: DC | PRN
  Administered 2014-07-30: 650 mg via RECTAL
  Filled 2014-07-29: qty 1

## 2014-07-29 MED ORDER — ACETAMINOPHEN 325 MG PO TABS
650.0000 mg | ORAL_TABLET | Freq: Four times a day (QID) | ORAL | Status: DC | PRN
  Administered 2014-07-30: 650 mg via ORAL
  Filled 2014-07-29: qty 2

## 2014-07-29 MED ORDER — DEXTROSE 5 % IV SOLN
500.0000 mg | Freq: Four times a day (QID) | INTRAVENOUS | Status: DC | PRN
  Filled 2014-07-29: qty 5

## 2014-07-29 MED ORDER — PHENOL 1.4 % MT LIQD: 1.0000 | OROMUCOSAL | Status: DC | PRN

## 2014-07-29 MED ORDER — ROCURONIUM BROMIDE 50 MG/5ML IV SOLN
INTRAVENOUS | Status: AC
  Filled 2014-07-29: qty 1

## 2014-07-29 MED ORDER — EPHEDRINE SULFATE 50 MG/ML IJ SOLN
INTRAMUSCULAR | Status: DC | PRN
  Administered 2014-07-29: 10 mg via INTRAVENOUS

## 2014-07-29 MED ORDER — FENTANYL BOLUS VIA INFUSION
25.0000 ug | INTRAVENOUS | Status: DC | PRN
  Filled 2014-07-29: qty 50

## 2014-07-29 MED ORDER — LACTATED RINGERS IV SOLN
INTRAVENOUS | Status: DC | PRN
  Administered 2014-07-29 (×2): via INTRAVENOUS

## 2014-07-29 MED ORDER — FENTANYL CITRATE 0.05 MG/ML IJ SOLN
25.0000 ug | INTRAMUSCULAR | Status: DC | PRN
  Administered 2014-07-29 (×2): 50 ug via INTRAVENOUS

## 2014-07-29 MED ORDER — SCOPOLAMINE 1 MG/3DAYS TD PT72: 1.0000 | MEDICATED_PATCH | TRANSDERMAL | Status: DC

## 2014-07-29 MED ORDER — PROPOFOL 10 MG/ML IV BOLUS
INTRAVENOUS | Status: AC
  Filled 2014-07-29: qty 20

## 2014-07-29 MED ORDER — ENOXAPARIN SODIUM 40 MG/0.4ML ~~LOC~~ SOLN
40.0000 mg | SUBCUTANEOUS | Status: DC
  Administered 2014-07-30 – 2014-08-02 (×4): 40 mg via SUBCUTANEOUS
  Filled 2014-07-29 (×6): qty 0.4

## 2014-07-29 MED ORDER — SODIUM CHLORIDE 0.9 % IV SOLN: Freq: Once | INTRAVENOUS | Status: DC

## 2014-07-29 MED ORDER — CETYLPYRIDINIUM CHLORIDE 0.05 % MT LIQD
7.0000 mL | Freq: Four times a day (QID) | OROMUCOSAL | Status: DC
  Administered 2014-07-29 – 2014-07-30 (×3): 7 mL via OROMUCOSAL

## 2014-07-29 MED ORDER — LIDOCAINE HCL (CARDIAC) 20 MG/ML IV SOLN
INTRAVENOUS | Status: DC | PRN
  Administered 2014-07-29: 50 mg via INTRAVENOUS

## 2014-07-29 MED ORDER — SUCCINYLCHOLINE CHLORIDE 20 MG/ML IJ SOLN
INTRAMUSCULAR | Status: DC | PRN
  Administered 2014-07-29: 100 mg via INTRAVENOUS

## 2014-07-29 MED ORDER — FENTANYL CITRATE 0.05 MG/ML IJ SOLN
INTRAMUSCULAR | Status: AC
  Filled 2014-07-29: qty 5

## 2014-07-29 MED ORDER — GLYCOPYRROLATE 0.2 MG/ML IJ SOLN
INTRAMUSCULAR | Status: AC
  Filled 2014-07-29: qty 2

## 2014-07-29 MED ORDER — PROPOFOL 10 MG/ML IV EMUL
INTRAVENOUS | Status: AC
  Administered 2014-07-29: 50 ug/kg/min via INTRAVENOUS
  Filled 2014-07-29: qty 100

## 2014-07-29 MED ORDER — MEPERIDINE HCL 25 MG/ML IJ SOLN: 6.2500 mg | INTRAMUSCULAR | Status: DC | PRN

## 2014-07-29 MED ORDER — FENTANYL CITRATE 0.05 MG/ML IJ SOLN: 50.0000 ug | INTRAMUSCULAR | Status: DC | PRN

## 2014-07-29 MED ORDER — SODIUM CHLORIDE 0.9 % IV SOLN
INTRAVENOUS | Status: DC | PRN
  Administered 2014-07-29: 12:00:00 via INTRAVENOUS

## 2014-07-29 MED ORDER — CEFAZOLIN SODIUM-DEXTROSE 2-3 GM-% IV SOLR
INTRAVENOUS | Status: DC | PRN
  Administered 2014-07-29: 2 g via INTRAVENOUS

## 2014-07-29 MED ORDER — GLYCOPYRROLATE 0.2 MG/ML IJ SOLN
INTRAMUSCULAR | Status: DC | PRN
  Administered 2014-07-29: 0.6 mg via INTRAVENOUS

## 2014-07-29 MED ORDER — CEFAZOLIN SODIUM-DEXTROSE 2-3 GM-% IV SOLR
2.0000 g | Freq: Four times a day (QID) | INTRAVENOUS | Status: AC
  Administered 2014-07-29 (×2): 2 g via INTRAVENOUS
  Filled 2014-07-29 (×2): qty 50

## 2014-07-29 MED ORDER — ONDANSETRON HCL 4 MG PO TABS: 4.0000 mg | ORAL_TABLET | Freq: Four times a day (QID) | ORAL | Status: DC | PRN

## 2014-07-29 MED ORDER — PROPOFOL 10 MG/ML IV EMUL
5.0000 ug/kg/min | INTRAVENOUS | Status: DC
  Administered 2014-07-29: 5 ug/kg/min via INTRAVENOUS
  Filled 2014-07-29: qty 100

## 2014-07-29 MED ORDER — PANTOPRAZOLE SODIUM 40 MG IV SOLR
40.0000 mg | Freq: Every day | INTRAVENOUS | Status: DC
  Administered 2014-07-30: 40 mg via INTRAVENOUS
  Filled 2014-07-29 (×3): qty 40

## 2014-07-29 MED ORDER — NEOSTIGMINE METHYLSULFATE 10 MG/10ML IV SOLN
INTRAVENOUS | Status: DC | PRN
  Administered 2014-07-29: 4 mg via INTRAVENOUS

## 2014-07-29 MED ORDER — METOCLOPRAMIDE HCL 5 MG/ML IJ SOLN
5.0000 mg | Freq: Three times a day (TID) | INTRAMUSCULAR | Status: DC | PRN
  Filled 2014-07-29: qty 2

## 2014-07-29 MED ORDER — SODIUM CHLORIDE 0.9 % IJ SOLN
INTRAMUSCULAR | Status: AC
  Filled 2014-07-29: qty 10

## 2014-07-29 MED ORDER — NEOSTIGMINE METHYLSULFATE 10 MG/10ML IV SOLN
INTRAVENOUS | Status: AC
  Filled 2014-07-29: qty 1

## 2014-07-29 MED ORDER — DOCUSATE SODIUM 100 MG PO CAPS
100.0000 mg | ORAL_CAPSULE | Freq: Two times a day (BID) | ORAL | Status: DC
  Administered 2014-07-30: 100 mg via ORAL
  Filled 2014-07-29 (×5): qty 1

## 2014-07-29 MED ORDER — SODIUM CHLORIDE 0.9 % IR SOLN
Status: DC | PRN
  Administered 2014-07-29: 1000 mL

## 2014-07-29 MED ORDER — DEXTROSE 5 % IV SOLN
10.0000 mg | INTRAVENOUS | Status: DC | PRN
  Administered 2014-07-29: 25 ug/min via INTRAVENOUS

## 2014-07-29 MED ORDER — PROPOFOL 10 MG/ML IV EMUL
0.0000 ug/kg/min | INTRAVENOUS | Status: DC
Start: 2014-07-29 — End: 2014-07-30
  Administered 2014-07-29: 35 ug/kg/min via INTRAVENOUS
  Administered 2014-07-29: 50 ug/kg/min via INTRAVENOUS
  Administered 2014-07-30: 40 ug/kg/min via INTRAVENOUS
  Administered 2014-07-30: 35 ug/kg/min via INTRAVENOUS
  Filled 2014-07-29: qty 200

## 2014-07-29 MED ORDER — METOCLOPRAMIDE HCL 5 MG PO TABS
5.0000 mg | ORAL_TABLET | Freq: Three times a day (TID) | ORAL | Status: DC | PRN
  Filled 2014-07-29: qty 2

## 2014-07-29 MED ORDER — FENTANYL CITRATE 0.05 MG/ML IJ SOLN: 25.0000 ug | INTRAMUSCULAR | Status: DC | PRN

## 2014-07-29 MED ORDER — CHLORHEXIDINE GLUCONATE 0.12 % MT SOLN
15.0000 mL | Freq: Two times a day (BID) | OROMUCOSAL | Status: DC
  Administered 2014-07-29 – 2014-07-30 (×2): 15 mL via OROMUCOSAL
  Filled 2014-07-29 (×2): qty 15

## 2014-07-29 MED ORDER — ARTIFICIAL TEARS OP OINT
TOPICAL_OINTMENT | OPHTHALMIC | Status: AC
  Filled 2014-07-29: qty 3.5

## 2014-07-29 MED ORDER — PHENYLEPHRINE 40 MCG/ML (10ML) SYRINGE FOR IV PUSH (FOR BLOOD PRESSURE SUPPORT)
PREFILLED_SYRINGE | INTRAVENOUS | Status: AC
  Filled 2014-07-29: qty 10

## 2014-07-29 MED ORDER — HYDRALAZINE HCL 20 MG/ML IJ SOLN
10.0000 mg | Freq: Four times a day (QID) | INTRAMUSCULAR | Status: DC | PRN
  Filled 2014-07-29: qty 0.5

## 2014-07-29 MED ORDER — SODIUM CHLORIDE 0.9 % IV SOLN
0.0000 ug/h | INTRAVENOUS | Status: DC
  Administered 2014-07-29: 50 ug/h via INTRAVENOUS
  Administered 2014-07-30: 25 ug/h via INTRAVENOUS
  Administered 2014-07-30: 50 ug/h via INTRAVENOUS
  Administered 2014-07-30 (×2): 25 ug/h via INTRAVENOUS
  Filled 2014-07-29 (×2): qty 50

## 2014-07-29 MED ORDER — EPHEDRINE SULFATE 50 MG/ML IJ SOLN
INTRAMUSCULAR | Status: AC
  Filled 2014-07-29: qty 1

## 2014-07-29 MED ORDER — MORPHINE SULFATE 2 MG/ML IJ SOLN: 0.5000 mg | INTRAMUSCULAR | Status: DC | PRN

## 2014-07-29 MED ORDER — FUROSEMIDE 10 MG/ML IJ SOLN
40.0000 mg | Freq: Once | INTRAMUSCULAR | Status: AC
  Administered 2014-07-29: 40 mg via INTRAVENOUS
  Filled 2014-07-29: qty 4

## 2014-07-29 MED ORDER — FENTANYL CITRATE 0.05 MG/ML IJ SOLN
INTRAMUSCULAR | Status: AC
  Administered 2014-07-29: 50 ug via INTRAVENOUS
  Filled 2014-07-29: qty 2

## 2014-07-29 MED ORDER — ONDANSETRON HCL 4 MG/2ML IJ SOLN
INTRAMUSCULAR | Status: DC | PRN
Start: 2014-07-29 — End: 2014-07-29
  Administered 2014-07-29: 4 mg via INTRAVENOUS

## 2014-07-29 MED ORDER — SODIUM CHLORIDE 0.9 % IV SOLN
0.0000 ug/h | INTRAVENOUS | Status: DC
  Administered 2014-07-29: 50 ug/h via INTRAVENOUS
  Filled 2014-07-29: qty 50

## 2014-07-29 MED ORDER — INSULIN ASPART 100 UNIT/ML ~~LOC~~ SOLN
2.0000 [IU] | SUBCUTANEOUS | Status: DC
  Administered 2014-07-29 – 2014-07-30 (×3): 2 [IU] via SUBCUTANEOUS

## 2014-07-29 MED ORDER — ROCURONIUM BROMIDE 100 MG/10ML IV SOLN
INTRAVENOUS | Status: DC | PRN
  Administered 2014-07-29: 50 mg via INTRAVENOUS

## 2014-07-29 SURGICAL SUPPLY — 42 items
BLADE SAGITTAL (BLADE) ×2
BLADE SAW THK.89X75X18XSGTL (BLADE) ×1 IMPLANT
CAPT HIP FX BIPOLAR/UNIPOLAR ×3 IMPLANT
COVER SURGICAL LIGHT HANDLE (MISCELLANEOUS) ×3 IMPLANT
DRAPE HIP W/POCKET STRL (DRAPE) ×3 IMPLANT
DRAPE INCISE IOBAN 85X60 (DRAPES) ×3 IMPLANT
DRAPE U-SHAPE 47X51 STRL (DRAPES) ×3 IMPLANT
DRSG AQUACEL AG ADV 3.5X10 (GAUZE/BANDAGES/DRESSINGS) ×3 IMPLANT
DRSG MEPILEX BORDER 4X8 (GAUZE/BANDAGES/DRESSINGS) ×3 IMPLANT
DURAPREP 26ML APPLICATOR (WOUND CARE) ×3 IMPLANT
ELECT BLADE 6.5 EXT (BLADE) IMPLANT
ELECT CAUTERY BLADE 6.4 (BLADE) ×3 IMPLANT
ELECT REM PT RETURN 9FT ADLT (ELECTROSURGICAL) ×3
ELECTRODE REM PT RTRN 9FT ADLT (ELECTROSURGICAL) ×1 IMPLANT
EVACUATOR 1/8 PVC DRAIN (DRAIN) IMPLANT
GAUZE XEROFORM 1X8 LF (GAUZE/BANDAGES/DRESSINGS) ×3 IMPLANT
GLOVE BIOGEL PI IND STRL 7.5 (GLOVE) ×1 IMPLANT
GLOVE BIOGEL PI IND STRL 8 (GLOVE) ×1 IMPLANT
GLOVE BIOGEL PI INDICATOR 7.5 (GLOVE) ×2
GLOVE BIOGEL PI INDICATOR 8 (GLOVE) ×2
GLOVE ORTHO TXT STRL SZ7.5 (GLOVE) ×6 IMPLANT
GOWN STRL REUS W/ TWL LRG LVL3 (GOWN DISPOSABLE) ×2 IMPLANT
GOWN STRL REUS W/ TWL XL LVL3 (GOWN DISPOSABLE) ×4 IMPLANT
GOWN STRL REUS W/TWL LRG LVL3 (GOWN DISPOSABLE) ×4
GOWN STRL REUS W/TWL XL LVL3 (GOWN DISPOSABLE) ×8
KIT BASIN OR (CUSTOM PROCEDURE TRAY) ×3 IMPLANT
KIT ROOM TURNOVER OR (KITS) ×3 IMPLANT
MANIFOLD NEPTUNE II (INSTRUMENTS) ×3 IMPLANT
NS IRRIG 1000ML POUR BTL (IV SOLUTION) ×3 IMPLANT
PACK TOTAL JOINT (CUSTOM PROCEDURE TRAY) ×3 IMPLANT
PAD ARMBOARD 7.5X6 YLW CONV (MISCELLANEOUS) ×6 IMPLANT
STAPLER VISISTAT 35W (STAPLE) ×3 IMPLANT
SUT ETHIBOND NAB CT1 #1 30IN (SUTURE) ×6 IMPLANT
SUT VIC AB 0 CT1 27 (SUTURE) ×2
SUT VIC AB 0 CT1 27XBRD ANBCTR (SUTURE) ×1 IMPLANT
SUT VIC AB 1 CTB1 27 (SUTURE) ×6 IMPLANT
SUT VIC AB 2-0 CT1 27 (SUTURE) ×6
SUT VIC AB 2-0 CT1 TAPERPNT 27 (SUTURE) ×3 IMPLANT
TOWEL OR 17X24 6PK STRL BLUE (TOWEL DISPOSABLE) ×3 IMPLANT
TOWEL OR 17X26 10 PK STRL BLUE (TOWEL DISPOSABLE) ×3 IMPLANT
TRAY FOLEY CATH 16FRSI W/METER (SET/KITS/TRAYS/PACK) IMPLANT
WATER STERILE IRR 1000ML POUR (IV SOLUTION) ×9 IMPLANT

## 2014-07-29 NOTE — Progress Notes (Signed)
Patient ID: Meagan [name], female   DOB: 18-Jun-1946, 68 y.o.   MRN: [id number] Surgery went well.  From a DVT standpoint, I will be fine with a baby aspirin and resuming her daily Plavix, if that is what she was on pre-operatively.

## 2014-07-29 NOTE — Anesthesia Postprocedure Evaluation (Signed)
  Anesthesia Post-op Note  Patient: Meagan [name]  Procedure(s) Performed: Procedure(s): ARTHROPLASTY BIPOLAR HIP (Right)  Patient Location: PACU  Anesthesia Type:General  Level of Consciousness: sedated  Airway and Oxygen Therapy: Patient remains intubated per anesthesia plan and Patient placed on Ventilator (see vital sign flow sheet for setting)  Post-op Pain: mild  Post-op Assessment: Post-op Vital signs reviewed and Patient's Cardiovascular Status Stable  Post-op Vital Signs: Reviewed and stable  Last Vitals:  Filed Vitals:   07/29/14 1339  BP: 152/66  Pulse: 86  Temp: 36.6 C  Resp: 14    Complications: No apparent anesthesia complications

## 2014-07-29 NOTE — Progress Notes (Signed)
PROGRESS NOTE  Meagan [name] HBZ:[id number] DOB: 27-Jan-1946 DOA: 07/27/2014 PCP: Glo Herring., MD  Meagan [name] is a 68 y.o. female with past medical history of CHF, severe PAH on continuous Flolan infusion, recent C. difficile colitis, history of MI and IDDM. She was brought to the hospital after she fell and broke her right hip. Patient said this morning she was getting ready to see her urologist, she got out of the bathroom and she fell.  She experienced severe right hip pain, and couldn't get up.  She was brought to the hospital for further evaluation. Patient denies any shortness of breath more than usual.  Per her husband she's been unsteady since discharge from the hospital recently for BRBPR and C. difficile colitis.  In the ED x-ray of the right hip showed right femoral neck fracture with displacement, orthopedics Dr. Erlinda Hong consulted.   Assessment/Plan: Closed right hip fracture.   Taken to the OR on 10/8. Appreciate Cardiology and CHF consultations for clearance. Requiring dilaudid 2 mg q 2 hours prn for pain.  She regularly takes 4 mg dilaudid po bid at home for fibromyalgia. Suspect will require SNF on discharge  Pulmonary Hypertension -Per CHF team her HF/PAH is stable-ok for proposed surgery -Patient is on continuous Flolan infusion, patient follows with Dr. Gilles Chiquito in [place].  -Appears to be controlled, last 2-D echo done in August showed normal systolic pressure in the pulmonary arteries  Diabetes mellitus, insulin-dependent  -Patient is on 12 units of insulin and home-currently on SSI - Moderate-CBG's stable-Lantus decreased to 8 units -hemoglobin A1c 6.3  Recent history of C. difficile diarrhea  -Avoid unnecessary antibiotics and PPIs.   Chronic diastolic CHF  -LVF from echo done on 06/05/2014 showed LVEF of 68% and grade 1 diastolic dysfunction.  -Patient appears to be euvolemic.  -Has chronic baseline of shortness of breath likely secondary to chronic  diastolic CHF and severe PAH. -Continue lasix, imdur, metoprolol  Normocytic anemia Baseline hgb appears to be 11 - 12. (declining since August with 2 hospitalizations.)-drop in Hb likely secondary to hip fracture No frank bleeding. Guiac stool. May require transfusion post op.   Will monitor.  Leukocytosis -? Stress response.  Trending down. - No evidence of UTI or pneumonia. Recent history of C. difficile colitis-however no diarrhea. Continue to monitor off antibiotics.  Chronic respiratory failure on home oxygen/severe pulmonary hypertension/restrictive lung disease - Continue oxygen and outpatient followup with [facility].  Hx Right parenchymal renal mass - Reviewed her last discharge summary done on 06/09/14-talk to be a slow-growing renal neoplasm. Has had urology evaluation during that admission, apparently has had outpatient urology followup as well.  DVT Prophylaxis:  heparin  Code Status: full Family Communication: husband at bedside. Disposition Plan: likely SNF at discharge.   Consultants:  Orthopedic surgery  Cardiology   CHF  Procedures:    Antibiotics: Anti-infectives   None        HPI/Subjective: Patient reports pain is controlled.  Aware she is going for surgery today.  Objective: Filed Vitals:   07/28/14 2347 07/29/14 0400 07/29/14 0559 07/29/14 0944  BP:   123/60 156/61  Pulse:   93 91  Temp:   99.3 F (37.4 C) 98.7 F (37.1 C)  TempSrc:   Oral Oral  Resp: 18 18 18 16   Height:      Weight:      SpO2: 98% 100% 98% 97%    Intake/Output Summary (Last 24 hours) at 07/29/14 1126 Last data filed  at 07/29/14 0930  Gross per 24 hour  Intake 184.99 ml  Output    700 ml  Net -515.01 ml   Filed Weights   07/27/14 1502 07/27/14 2010  Weight: 68.04 kg (150 lb) 66.4 kg (146 lb 6.2 oz)    Exam: General: WD, elderly female, husband at bedside.  HEENT:  EOMI, Anicteic Sclera, MMM. No pharyngeal erythema or exudates  Neck: Supple, no JVD, no  masses  Cardiovascular: RRR, S1 S2 auscultated, no rubs, or gallops.   Respiratory: Clear to auscultation bilaterally with equal chest rise.  On oxygen Abdomen: Obese, Soft, nontender, nondistended, + bowel sounds  Extremities: warm dry without cyanosis clubbing or edema.  Neuro: AAOx3, cranial nerves grossly intact.  Skin: Without rashes exudates or nodules.     Data Reviewed: Basic Metabolic Panel:  Recent Labs Lab 07/27/14 1530 07/28/14 0525 07/29/14 0520  NA 137 140 136*  K 3.6* 3.2* 4.6  CL 94* 97 98  CO2 31 32 29  GLUCOSE 85 44* 84  BUN 11 10 11   CREATININE 1.02 0.84 0.94  CALCIUM 8.6 8.5 8.4   CBC:  Recent Labs Lab 07/27/14 1530 07/28/14 0525 07/29/14 0520  WBC 20.9* 15.9* 13.6*  NEUTROABS 19.0*  --   --   HGB 10.4* 9.3* 8.9*  HCT 32.4* 28.4* 27.0*  MCV 86.2 84.0 84.6  PLT 307 278 261   BNP (last 3 results)  Recent Labs  02/23/14 1006  PROBNP 803.2*   CBG:  Recent Labs Lab 07/28/14 1154 07/28/14 1659 07/28/14 2217 07/29/14 0748 07/29/14 1059  GLUCAP 200* 129* 191* 89 94    Recent Results (from the past 240 hour(s))  SURGICAL PCR SCREEN     Status: Abnormal   Collection Time    07/28/14  2:42 AM      Result Value Ref Range Status   MRSA, PCR NEGATIVE  NEGATIVE Final   Staphylococcus aureus POSITIVE (*) NEGATIVE Final   Comment:            The Xpert SA Assay (FDA     approved for NASAL specimens     in patients over 67 years of age),     is one component of     a comprehensive surveillance     program.  Test performance has     been validated by [employer] for patients greater     than or equal to 46 year old.     It is not intended     to diagnose infection nor to     guide or monitor treatment.     Studies: Dg Chest 1 View  07/27/2014   CLINICAL DATA:  Fall.  Right hip pain  EXAM: CHEST - 1 VIEW  COMPARISON:  06/04/2014  FINDINGS: Prior CABG. Central venous catheter tip in the right atrium unchanged.  Diffuse bilateral  airspace disease shows mild progression and may represent edema or diffuse pneumonia. Small pleural effusions.  IMPRESSION: Diffuse bilateral airspace disease suggestive of pulmonary edema.   Electronically Signed   By: Franchot Gallo M.D.   On: 07/27/2014 16:42   Dg Hip Complete Right  07/27/2014   CLINICAL DATA:  Fall.  Right hip pain 1 day  EXAM: RIGHT HIP - COMPLETE 2+ VIEW  COMPARISON:  None.  FINDINGS: Right femoral neck fracture with mild displacement. Mild joint space narrowing of the right hip joint. No other fracture. Left hip joint is negative  IMPRESSION: Right femoral neck fracture  with displacement.   Electronically Signed   By: Franchot Gallo M.D.   On: 07/27/2014 16:40    Scheduled Meds: . sodium chloride   Intravenous Once  . sodium chloride   Intravenous Once  . [MAR HOLD] ALPRAZolam  1 mg Oral QHS  . [facility] HOLD] antiseptic oral rinse  7 mL Mouth Rinse BID  . [facility] HOLD] aspirin EC  81 mg Oral Daily  . [MAR HOLD] Chlorhexidine Gluconate Cloth  6 each Topical Daily  . [MAR HOLD] DULoxetine  30 mg Oral QHS  . [facility] HOLD] DULoxetine  60 mg Oral Daily  . [MAR HOLD] ezetimibe-simvastatin  1 tablet Oral QHS  . [facility] HOLD] furosemide  80 mg Oral Daily  . [MAR HOLD] ice pack  1 each Other 4 times per day  . [MAR HOLD] insulin aspart  0-15 Units Subcutaneous TID WC  . [MAR HOLD] insulin glargine  8 Units Subcutaneous q morning - 10a  . [facility] HOLD] isosorbide mononitrate  90 mg Oral q morning - 10a  . [facility] HOLD] levothyroxine  25 mcg Oral QAC breakfast  . [MAR HOLD] metoprolol succinate  100 mg Oral Daily  . [facility] HOLD] mupirocin ointment  1 application Nasal BID  . [MAR HOLD] ondansetron  8 mg Oral BID  . [facility] HOLD] potassium chloride SA  40 mEq Oral Daily   Continuous Infusions: . epoprostenol 33.45 ng/kg/min (07/28/14 1855)    Principal Problem:   Closed right hip fracture Active Problems:   DM (diabetes mellitus), type 2, uncontrolled   PAH (pulmonary artery hypertension)    Acute on chronic diastolic heart failure   Reflux esophagitis   C. difficile diarrhea   CKD (chronic kidney disease), stage III   Pre-operative cardiovascular examination   Right renal mass   Anemia  Karen Kitchens  [facility] Pager [phone]. If 7PM-7AM, please contact night-coverage at [email or web address], password [facility] 07/29/2014, 11:26 AM  LOS: 2 days   Attending Patient was seen, examined,treatment plan was discussed with the  Advance Practice Provider.  I have directly reviewed the clinical findings, lab, imaging studies and management of this patient in detail. I have made the necessary changes to the above noted documentation, and agree with the documentation, as recorded by the Advance Practice Provider.   Going to the or today for a right hip repair, appreciate cardiology assistance. Will need very close monitoring post op. Rest as above  Nena Alexander MD [facility].

## 2014-07-29 NOTE — Clinical Social Work Psychosocial (Signed)
Clinical Social Work Department BRIEF PSYCHOSOCIAL ASSESSMENT 07/29/2014  Patient:  Meagan [name], Meagan [name]     Account Number:  [id number]     Admit date:  07/27/2014  Clinical Social Worker:  Lovey Newcomer  Date/Time:  07/29/2014 04:06 PM  Referred by:  Physician  Date Referred:  07/29/2014 Referred for  SNF Placement   Other Referral:   NA   Interview type:  Family Other interview type:   Patient's husband interviewed to complete assessment as patient currently in surgery.    PSYCHOSOCIAL DATA Living Status:  HUSBAND Admitted from facility:   Level of care:   Primary support name:  Meagan [name] Primary support relationship to patient:  SPOUSE Degree of support available:   Support is good.    CURRENT CONCERNS Current Concerns  Post-Acute Placement   Other Concerns:   NA    SOCIAL WORK ASSESSMENT / PLAN CSW spoke with patient's husband by phone to complete assessment. Husband Meagan [name] states that the patient lives at home with him and he states that he is able to assist her in the home. CSW explained that MD has recommended SNF placement for rehab for patient. Meagan [name] adamantly refuses any kind of placement and states that he and the patient will only consider returning home with HHPT. CSW requested that patient and husband notify treatment team if this plan changes. CSW will sign off at this time.   Assessment/plan status:  No Further Intervention Required Other assessment/ plan:   NONE   Information/referral to community resources:   NONE    PATIENT'S/FAMILY'S RESPONSE TO PLAN OF CARE: Patient and husband plan for a discharge home with HHPT when stable. CSW will sign off at this time.       Liz Beach MSW, [place], [place], [phone]

## 2014-07-29 NOTE — Consult Note (Signed)
PULMONARY / CRITICAL CARE MEDICINE   Name: Meagan [name] MRN: [id number] DOB: Aug 30, 1946    ADMISSION DATE:  07/27/2014 CONSULTATION DATE:  07/29/14  REFERRING MD :  Dr. Sloan Leiter   CHIEF COMPLAINT:  Vent management   INITIAL PRESENTATION: 68 yo female with hx CHF, severe PAH on flolan, recent CDiff colitis admitted 10/6 after a fall with displaced R hip fx.  After surgical clearance by cards, he underwent surgical repair R hip 10/8.  Post op failed extubation in PACU requiring reintubation and she was tx to ICU intubated and PCCM consulted for ICU management.   STUDIES:    SIGNIFICANT EVENTS: 10/8 R hip arthoplasty, failed extubation, to ICU on vent    HISTORY OF PRESENT ILLNESS:  68 yo female with hx CHF, severe PAH on flolan, recent CDiff colitis admitted 10/6 after a fall with displaced R hip fx.  After surgical clearance by cards, he underwent surgical repair R hip 10/8.  Post op failed extubation in PACU requiring reintubation and she was tx to ICU intubated and PCCM consulted for ICU management. Per cards PAH well controlled with improved PA pressure and normal cardiac output on last echo 5/15.   PAST MEDICAL HISTORY :  Past Medical History  Diagnosis Date  . Fibromyalgia   . Hypercholesterolemia   . Hypertension     pulmonary  pap 110/31 (mean 62)by [facility] 9/10 negative PE by chest CT 2010  . Pulmonary edema     Failed Revatio   . ARF (acute renal failure)     poor candidate for ACE -I or ARB  . Restrictive lung disease     obesity hypoventilation syndrome  . Renal insufficiency   . CAD (coronary artery disease)     multivessel DES x 2 RCA 2007  . Chronic diastolic heart failure   . MRSA (methicillin resistant staphylococcus aureus) pneumonia   . Pulmonary hypertension   . Chronic anemia     anemia of chronic disease based on anemia panel 2011  . Pancreatic mass 08/01/2012  . PONV (postoperative nausea and vomiting)   . Heart murmur     "when I was small"  . CHF  (congestive heart failure)   . Anginal pain   . Pneumonia X 3  . Type II diabetes mellitus   . Hypothyroidism   . H/O hiatal hernia   . GERD (gastroesophageal reflux disease)   . Anxiety   . On home oxygen therapy     "6L all the time" (02/24/2014      has past surgical history that includes Coronary artery bypass graft (1997); Esophagogastroduodenoscopy (06/28/10); Coronary angioplasty (1993); Coronary angioplasty with stent (~ 1996; 2008); Abdominal hysterectomy (~ 1986); Dilation and curettage of uterus (~ 1984); Tubal ligation; and Esophagogastroduodenoscopy (N/A, 06/06/2014). Prior to Admission medications   Medication Sig Start Date End Date Taking? Authorizing Provider  ALPRAZolam Duanne Moron) 0.5 MG tablet Take 1 mg by mouth at bedtime. For sleep. *Prescribed one tablet three times daily as needed*   Yes Historical Provider, MD  aspirin EC 81 MG EC tablet Take 1 tablet (81 mg total) by mouth daily. 06/09/14  Yes Modena Jansky, MD  DULoxetine (CYMBALTA) 30 MG capsule Take 30 mg by mouth at bedtime.    Yes Historical Provider, MD  DULoxetine (CYMBALTA) 60 MG capsule Take 60 mg by mouth every morning.    Yes Historical Provider, MD  Epoprostenol Sodium (FLOLAN IV) Inject into the vein. 1.5 mg. Inject into the vein continuously. Infuse mg/kg/min  via cont IV pump.   Yes Historical Provider, MD  ezetimibe-simvastatin (VYTORIN) 10-40 MG per tablet Take 1 tablet by mouth every evening.   Yes Historical Provider, MD  furosemide (LASIX) 40 MG tablet Take 80 mg by mouth daily.    Yes Historical Provider, MD  HYDROmorphone (DILAUDID) 2 MG tablet Take 4 mg by mouth 2 (two) times daily.    Yes Historical Provider, MD  insulin glargine (LANTUS) 100 UNIT/ML injection Inject 0.12 mLs (12 Units total) into the skin every morning. 06/09/14  Yes Modena Jansky, MD  isosorbide mononitrate (IMDUR) 60 MG 24 hr tablet Take 90 mg by mouth every morning.    Yes Historical Provider, MD  levothyroxine (SYNTHROID,  LEVOTHROID) 25 MCG tablet Take 25 mcg by mouth every morning.    Yes Historical Provider, MD  metoprolol (TOPROL-XL) 100 MG 24 hr tablet Take 100 mg by mouth every morning.    Yes Historical Provider, MD  omeprazole (PRILOSEC) 20 MG capsule Take 20 mg by mouth 2 (two) times daily.     Yes Historical Provider, MD  ondansetron (ZOFRAN) 8 MG tablet Take 8 mg by mouth 2 (two) times daily. For nausea   Yes Historical Provider, MD  Oxygen Permeable [employer]ens Products SOLN Inhale into the lungs continuous. 6 liters 12/31/11  Yes Jolaine Artist, MD  Oxymetazoline HCl (NASAL SPRAY) 0.05 % SOLN Place 1 spray into the nose as needed (for congestion).   Yes Historical Provider, MD  potassium chloride SA (K-DUR,KLOR-CON) 20 MEQ tablet Take 40 mEq by mouth daily.  09/02/12  Yes Historical Provider, MD  Probiotic Product (FLORAJEN3 PO) Take 1 tablet by mouth daily.   Yes Historical Provider, MD  traMADol (ULTRAM) 50 MG tablet Take 50 mg by mouth every 3 (three) hours as needed. For pain. *takes with Dilaudid (Hydromorphone) 04/22/12  Yes Historical Provider, MD  VYTORIN 10-40 MG per tablet TAKE ONE TABLET BY MOUTH AT BEDTIME 07/05/14  Yes Jolaine Artist, MD  nitroGLYCERIN (NITROSTAT) 0.4 MG SL tablet Place 0.4 mg under the tongue every 5 (five) minutes as needed for chest pain.     Historical Provider, MD   Allergies  Allergen Reactions  . Aspirin     REACTION: Intolerance  . Codeine Other (See Comments)    Messes with digestive system  . Niacin Other (See Comments)    Pins sticking into skin  . Nsaids Other (See Comments)    REACTION: Aggravates Digestive System   . Phenergan Fortis [Promethazine] Other (See Comments)    Family and pt states becomes very confused and wild.   . Sulfonamide Derivatives Other (See Comments)    Messes with digestive system    FAMILY HISTORY:  indicated that her mother is deceased. She indicated that her father is deceased. She indicated that both of her sisters are alive.  She indicated that only one of her two brothers is alive.  SOCIAL HISTORY:  reports that she has never smoked. She has never used smokeless tobacco. She reports that she does not drink alcohol or use illicit drugs.  REVIEW OF SYSTEMS:  Unable, pt sedated on vent   SUBJECTIVE:   VITAL SIGNS: Temp:  [97.8 F (36.6 C)-99.3 F (37.4 C)] 97.8 F (36.6 C) (10/08 1530) Pulse Rate:  [85-109] 109 (10/08 1608) Resp:  [14-24] 24 (10/08 1608) BP: (108-156)/(43-66) 138/52 mmHg (10/08 1545) SpO2:  [97 %-100 %] 100 % (10/08 1608) FiO2 (%):  [100 %] 100 % (10/08 1608) HEMODYNAMICS:   VENTILATOR  SETTINGS: Vent Mode:  [-] PRVC FiO2 (%):  [100 %] 100 % Set Rate:  [14 bmp] 14 bmp Vt Set:  [450 mL] 450 mL PEEP:  [5 cmH20] 5 cmH20 Plateau Pressure:  [26 cmH20-28 cmH20] 28 cmH20 INTAKE / OUTPUT:  Intake/Output Summary (Last 24 hours) at 07/29/14 1626 Last data filed at 07/29/14 1224  Gross per 24 hour  Intake 1514.99 ml  Output   2050 ml  Net -535.01 ml    PHYSICAL EXAMINATION: General:  Female of normal body habitus on vent Neuro:  Sedated, RASS -1. Follows commands HEENT:  [place]/AT, ETT in place. PERRL, No JVD noted Cardiovascular:  RRR, SEM 2/6, no RG Lungs:  Even, unlabored, clear bilateral breath sounds.  Abdomen:  Obese, Soft, non-tender, non-distended Musculoskeletal:  No acute deformity.  Skin:  Surgical dressing to R hip appears CDI, no areas of breakdown.   LABS:  CBC  Recent Labs Lab 07/27/14 1530 07/28/14 0525 07/29/14 0520  WBC 20.9* 15.9* 13.6*  HGB 10.4* 9.3* 8.9*  HCT 32.4* 28.4* 27.0*  PLT 307 278 261   Coag's No results found for this basename: APTT, INR,  in the last 168 hours BMET  Recent Labs Lab 07/27/14 1530 07/28/14 0525 07/29/14 0520  NA 137 140 136*  K 3.6* 3.2* 4.6  CL 94* 97 98  CO2 31 32 29  BUN 11 10 11   CREATININE 1.02 0.84 0.94  GLUCOSE 85 44* 84   Electrolytes  Recent Labs Lab 07/27/14 1530 07/28/14 0525 07/29/14 0520   CALCIUM 8.6 8.5 8.4   Sepsis Markers No results found for this basename: LATICACIDVEN, PROCALCITON, O2SATVEN,  in the last 168 hours ABG No results found for this basename: PHART, PCO2ART, PO2ART,  in the last 168 hours Liver Enzymes No results found for this basename: AST, ALT, ALKPHOS, BILITOT, ALBUMIN,  in the last 168 hours Cardiac Enzymes No results found for this basename: TROPONINI, PROBNP,  in the last 168 hours Glucose  Recent Labs Lab 07/28/14 1154 07/28/14 1659 07/28/14 2217 07/29/14 0748 07/29/14 0940 07/29/14 1059  GLUCAP 200* 129* 191* 89 85 94    Imaging No results found.   ASSESSMENT / PLAN:  PULMONARY OETT 10/8>>> (failed extubation in pacu) Severe PAH - on flolan, (Dr. Kelby Aline) Chronic respiratory failure - 6L home O2 OHS  CXR s/o pulm edema P:   F/u CXR and ABG in am  SBT in am, hopefully extubate 10/9 Flolan as below  CARDIOVASCULAR Severe PAH - on flolan  CAD - s/p CABG  Chronic dCHF   P:  Flolan infusion, husband assisting with management If difficult weaning in AM , consult Bensimhon -doubt need for  inhaled nitric or milrinone  Holding PO statin, ASA while intubated Continue metoprolol in am, If remains intubated will need NG/OGT  RENAL A:   Metabolic alkalosis -favor post hypercarbic (baseline pcO2 55 ) rather than overdiuresis  P:   Follow Bmet  GASTROINTESTINAL A:   No acute issues  P:   SUP: IV Protonix NPO while intubated  HEMATOLOGIC A:   Anemia - chronic  P:  Recheck CBC Transfuse per normal ICU guidelines enoxaparin for VTE ppx starting 10/9  INFECTIOUS A:   Recent Cdif (course ABX completed)  P:   Perioperative ancef Monitor clinically   ENDOCRINE A:   DM   P:   CBG & SSI D/c Lantus while NPO (is on 8 units daily) Holding PO synthroid while intubated.   NEUROLOGIC A:   Acute encephalopathy  2nd to medical sedation Chronic benzo use P:   RASS goal: -2 to -3.  Low dose propofol gtt as  bP tolerates, Versed PRN ok for sedation Fentanyl gtt for analgesia PAD protocol Holding scheduled PO alprazolam, duloxetine while intubated.   MUSCULOSKELETAL: A: Closed R hip fracture  P: Management per ortho Pain management as above Will likely need SNF on discharge per primary team.   Family updated: 10/8  Interdisciplinary Family Meeting v Palliative Care Meeting:   Georgann Housekeeper, ACNP [place] [facility] Pager [phone] or [phone]   TODAY'S SUMMARY: WHO class 2 PH on flolan, failed extubation post hip replacement due to acute pulmonary edema , Diurese gently, maintain hemodynamics while on vent  I have personally obtained a history, examined the patient, evaluated laboratory and imaging results, formulated the assessment and plan and placed orders. CRITICAL CARE: The patient is critically ill with multiple organ systems failure and requires high complexity decision making for assessment and support, frequent evaluation and titration of therapies, application of advanced monitoring technologies and extensive interpretation of multiple databases. Critical Care Time devoted to patient care services described in this note is 50  minutes.    Rigoberto Noel MD

## 2014-07-29 NOTE — Transfer of Care (Signed)
Immediate Anesthesia Transfer of Care Note  Patient: Meagan [name]  Procedure(s) Performed: Procedure(s): ARTHROPLASTY BIPOLAR HIP (Right)  Patient Location: PACU  Anesthesia Type:General  Level of Consciousness: Patient remains intubated per anesthesia plan  Airway & Oxygen Therapy: Patient remains intubated per anesthesia plan and Patient placed on Ventilator (see vital sign flow sheet for setting)  Post-op Assessment: Report given to PACU RN  Post vital signs: Reviewed and stable  Complications: No apparent anesthesia complications

## 2014-07-29 NOTE — Brief Op Note (Signed)
07/27/2014 - 07/29/2014  12:40 PM  PATIENT:  Billie Lade [place]  68 y.o. female  PRE-OPERATIVE DIAGNOSIS:  RIGHT HIP FRACTURE  POST-OPERATIVE DIAGNOSIS:  same  PROCEDURE:  Procedure(s): ARTHROPLASTY BIPOLAR HIP (Right)  SURGEON:  Surgeon(s) and Role:    * Mcarthur Rossetti, MD - Primary  PHYSICIAN ASSISTANT: Benita Stabile, PA-C  ANESTHESIA:   general  EBL:  Total I/O In: 1410 [I.V.:1100; Blood:350] Out: 2050 [Urine:1700; Blood:350]  BLOOD ADMINISTERED:500 CC PRBC  DRAINS: none   LOCAL MEDICATIONS USED:  NONE  SPECIMEN:  No Specimen  DISPOSITION OF SPECIMEN:  N/A  COUNTS:  YES  TOURNIQUET:  * No tourniquets in log *  DICTATION: .Other Dictation: Dictation Number [id number]  PLAN OF CARE:  Inpatient already  PATIENT DISPOSITION:  PACU - guarded condition.   Delay start of Pharmacological VTE agent (>24hrs) due to surgical blood loss or risk of bleeding: no

## 2014-07-29 NOTE — Progress Notes (Signed)
Patient ID: TACHE BOBST, female   DOB: 1946/06/01, 68 y.o.   MRN: [id number] I have spoken to her in length and she understands she has a right hip fracture and that it has been recommended she undergo a right hip hemiarthroplasty to increase her mobility and decrease her pain.  This will also hopefully improve her quality of life.  The main risks include acute blood loss anemia,  Infection, and death given her co-morbidities.

## 2014-07-30 ENCOUNTER — Encounter (HOSPITAL_COMMUNITY): Payer: Self-pay | Admitting: Orthopaedic Surgery

## 2014-07-30 LAB — GLUCOSE, CAPILLARY
GLUCOSE-CAPILLARY: 135 mg/dL — AB (ref 70–99)
GLUCOSE-CAPILLARY: 129 mg/dL — AB (ref 70–99)
GLUCOSE-CAPILLARY: 138 mg/dL — AB (ref 70–99)
Glucose-Capillary: 104 mg/dL — ABNORMAL HIGH (ref 70–99)
Glucose-Capillary: 202 mg/dL — ABNORMAL HIGH (ref 70–99)
Glucose-Capillary: 99 mg/dL (ref 70–99)

## 2014-07-30 LAB — POCT I-STAT 4, (NA,K, GLUC, HGB,HCT)
Glucose, Bld: 118 mg/dL — ABNORMAL HIGH (ref 70–99)
HCT: 30 % — ABNORMAL LOW (ref 36.0–46.0)
HEMOGLOBIN: 10.2 g/dL — AB (ref 12.0–15.0)
POTASSIUM: 4.5 meq/L (ref 3.7–5.3)
Sodium: 134 mEq/L — ABNORMAL LOW (ref 137–147)

## 2014-07-30 LAB — BASIC METABOLIC PANEL
ANION GAP: 14 (ref 5–15)
BUN: 14 mg/dL (ref 6–23)
CALCIUM: 8.6 mg/dL (ref 8.4–10.5)
CO2: 29 mEq/L (ref 19–32)
CREATININE: 1.01 mg/dL (ref 0.50–1.10)
Chloride: 95 mEq/L — ABNORMAL LOW (ref 96–112)
GFR calc non Af Amer: 55 mL/min — ABNORMAL LOW (ref >90)
GFR, EST AFRICAN AMERICAN: 64 mL/min — AB (ref >90)
Glucose, Bld: 115 mg/dL — ABNORMAL HIGH (ref 70–99)
Potassium: 4.2 mEq/L (ref 3.7–5.3)
Sodium: 138 mEq/L (ref 137–147)

## 2014-07-30 LAB — CBC
HCT: 28.6 % — ABNORMAL LOW (ref 36.0–46.0)
Hemoglobin: 9.6 g/dL — ABNORMAL LOW (ref 12.0–15.0)
MCH: 28 pg (ref 26.0–34.0)
MCHC: 33.6 g/dL (ref 30.0–36.0)
MCV: 83.4 fL (ref 78.0–100.0)
Platelets: 251 10*3/uL (ref 150–400)
RBC: 3.43 MIL/uL — ABNORMAL LOW (ref 3.87–5.11)
RDW: 13.6 % (ref 11.5–15.5)
WBC: 23.9 10*3/uL — ABNORMAL HIGH (ref 4.0–10.5)

## 2014-07-30 LAB — TRIGLYCERIDES: Triglycerides: 170 mg/dL — ABNORMAL HIGH (ref <150)

## 2014-07-30 MED ORDER — INSULIN ASPART 100 UNIT/ML ~~LOC~~ SOLN
0.0000 [IU] | Freq: Every day | SUBCUTANEOUS | Status: DC
  Administered 2014-07-30: 3 [IU] via SUBCUTANEOUS

## 2014-07-30 MED ORDER — FENTANYL BOLUS VIA INFUSION
25.0000 ug | INTRAVENOUS | Status: DC | PRN
  Filled 2014-07-30: qty 100

## 2014-07-30 MED ORDER — PANTOPRAZOLE SODIUM 40 MG PO TBEC
40.0000 mg | DELAYED_RELEASE_TABLET | Freq: Every day | ORAL | Status: DC
  Administered 2014-07-31 – 2014-08-03 (×3): 40 mg via ORAL
  Filled 2014-07-30 (×3): qty 1

## 2014-07-30 MED ORDER — METOPROLOL TARTRATE 1 MG/ML IV SOLN
INTRAVENOUS | Status: AC
  Filled 2014-07-30: qty 5

## 2014-07-30 MED ORDER — OXYCODONE-ACETAMINOPHEN 5-325 MG PO TABS
1.0000 | ORAL_TABLET | ORAL | Status: DC | PRN
  Administered 2014-07-30 – 2014-08-03 (×14): 1 via ORAL
  Filled 2014-07-30 (×16): qty 1

## 2014-07-30 MED ORDER — INSULIN ASPART 100 UNIT/ML ~~LOC~~ SOLN
0.0000 [IU] | Freq: Three times a day (TID) | SUBCUTANEOUS | Status: DC
  Administered 2014-07-30: 5 [IU] via SUBCUTANEOUS
  Administered 2014-07-31: 2 [IU] via SUBCUTANEOUS
  Administered 2014-07-31 (×2): 3 [IU] via SUBCUTANEOUS
  Administered 2014-08-01: 5 [IU] via SUBCUTANEOUS
  Administered 2014-08-01: 3 [IU] via SUBCUTANEOUS
  Administered 2014-08-02: 5 [IU] via SUBCUTANEOUS
  Administered 2014-08-02 (×2): 3 [IU] via SUBCUTANEOUS
  Administered 2014-08-03: 5 [IU] via SUBCUTANEOUS
  Administered 2014-08-03 (×2): 2 [IU] via SUBCUTANEOUS

## 2014-07-30 MED ORDER — METOPROLOL TARTRATE 1 MG/ML IV SOLN
2.5000 mg | INTRAVENOUS | Status: DC | PRN
  Administered 2014-07-30: 2.5 mg via INTRAVENOUS

## 2014-07-30 NOTE — Progress Notes (Addendum)
Physical Therapy Treatment Patient Details Name: Meagan [name] MRN: [id number] DOB: 1946-07-03 Today's Date: 07/30/2014    History of Present Illness 68 yo female with hx CHF, severe PAH on flolan, recent C Diff colitis admitted 10/6 after a fall with displaced R hip fx.  After surgical clearance by Cards, she underwent surgical repair R hip 10/8.  Post op failed extubation in PACU requiring reintubation and she was tx to ICU intubated and PCCM consulted for ICU management.     PT Comments    Patient seen by therapies for second session with transfer training and assist with peri care and hygiene. Patient  Continues to require education regarding mobility concerns and need for further rehabilitation. Husband appears to recognize concerns and seems receptive to POC. Will continue to see and progress as tolerated.   Follow Up Recommendations  CIR;Supervision/Assistance - 24 hour     Equipment Recommendations  Other (comment) (TBD)    Recommendations for Other Services Rehab consult     Precautions / Restrictions Precautions Precautions: Anterior Hip (anterolateral) Precaution Booklet Issued: No Restrictions Weight Bearing Restrictions: Yes RLE Weight Bearing: Weight bearing as tolerated    Mobility  Bed Mobility Overal bed mobility: Needs Assistance;+2 for physical assistance Bed Mobility: Supine to Sit;Sit to Supine     Supine to sit: Max assist;+2 for physical assistance Sit to supine: Max assist;+2 for physical assistance   General bed mobility comments: Patient able to initate some movement with LLE and trunk  Transfers Overall transfer level: Needs assistance Equipment used:  (face to face with gait belt and +2 total assist) Transfers: Sit to/from Stand;Stand Pivot Transfers Sit to Stand: Total assist;+2 physical assistance Stand pivot transfers: Total assist;+2 physical assistance      VCs for hand placement and upright posture during transfers.  General  transfer comment: transfer from chair to [facility] and from [facility] to bed with assist, OT present for hygiene and assist.  Ambulation/Gait                 Stairs            Wheelchair Mobility    Modified Rankin (Stroke Patients Only)       Balance Overall balance assessment: Needs assistance Sitting-balance support: Feet supported Sitting balance-Leahy Scale: Fair     Standing balance support: During functional activity Standing balance-Leahy Scale: Zero                      Cognition Arousal/Alertness: Awake/alert Behavior During Therapy: Anxious Overall Cognitive Status: Impaired/Different from baseline Area of Impairment: Awareness           Awareness: Intellectual        Exercises      General Comments        Pertinent Vitals/Pain Pain Assessment: 0-10    Home Living Family/patient expects to be discharged to:: Private residence Living Arrangements: Spouse/significant other Available Help at Discharge: Family;Available 24 hours/day Type of Home: House Home Access: Stairs to enter Entrance Stairs-Rails: None Home Layout: One level Home Equipment: Environmental consultant - 4 wheels;Cane - single point;Shower seat - built in;Bedside commode;Electric scooter;Grab bars - tub/shower      Prior Function Level of Independence: Independent      Comments: Husband assists with bathing due to pump for pulmonary hypertension. Pt has not driven in a year. Can go out in the community when she feels good. On bad days she stays at home in bed mostly.    PT Goals (  current goals can now be found in the care plan section) Acute Rehab PT Goals Patient Stated Goal: to go home PT Goal Formulation: With patient/family Time For Goal Achievement: 08/13/14 Potential to Achieve Goals: Fair    Frequency  Min 3X/week    PT Plan      Co-evaluation PT/OT/SLP Co-Evaluation/Treatment: Yes Reason for Co-Treatment: Complexity of the patient's impairments (multi-system  involvement);For patient/therapist safety PT goals addressed during session: Mobility/safety with mobility       End of Session Equipment Utilized During Treatment: Gait belt;Oxygen Activity Tolerance: Patient limited by fatigue;Patient limited by pain Patient left: in bed;with call bell/phone within reach;with nursing/sitter in room;with family/visitor present     Time: 9892-1194 (out of room for 10 mins) PT Time Calculation (min): 31 min  Charges:  $Therapeutic Activity: 8-22 mins                    G CodesDuncan Dull 08/20/2014, 5:27 PM Alben Deeds, [place] DPT  [phone]

## 2014-07-30 NOTE — Op Note (Signed)
NAMEMIREYA, Meagan [name] NO.:  [id number]  MEDICAL RECORD NO.:  [id number]  LOCATION:  3M05C                        FACILITY:  [place]  PHYSICIAN:  Lind Guest. Ninfa Linden, M.D.DATE OF BIRTH:  October 18, 1946  DATE OF PROCEDURE:  07/29/2014 DATE OF DISCHARGE:                              OPERATIVE REPORT   PREOPERATIVE DIAGNOSIS:  Right hip displaced and angulated femoral neck fracture.  POSTOPERATIVE DIAGNOSIS:  Right hip displaced and angulated femoral neck fracture.  PROCEDURE:  Right total hip arthroplasty, uncemented press-fit.  COMPONENTS:  DePuy Summit Basic Press-Fit femoral component, size 4, size 48 unipolar head with +0 taper spacer.  SURGEON:  Lind Guest. Ninfa Linden, M.D.  ASSISTANT:  Erskine Emery, PA-C.  ANESTHESIA:  General.  BLOOD LOSS:  350 mL.  FLUIDS:  1 unit packed red blood cells.  COMPLICATIONS:  None.  ANTIBIOTICS:  2 g of IV Ancef.  INDICATIONS:  Meagan [name] is a very sickly 68 year old female who 2 days ago was admitted for a mechanical fall.  She was also on Plavix and was found to have a right displaced femoral neck fracture.  This is the initial encounter for her.  She was admitted to the Medicine Service and now was deemed at least stable enough for surgical intervention.  I have talked to the family about the risks and benefits of right hip surgery and dressing of femoral neck fractures in this patient population.  She and her family understand the risks of acute blood loss anemia, nerve and vessel injury, fracture, infection, dislocation of DVT.  They understand the comorbidities that she has risk of perioperative death. They understand the benefits of surgery include decreased pain, improved mobility, and overall improved quality of life.  PROCEDURE DESCRIPTION:  After informed consent was obtained, appropriate right hip was marked.  She was brought to the operating room, general anesthesia was obtained while she was on  her stretcher.  She was then placed supine on the operating table and then turned into the lateral decubitus position with protecting her airway and axillary roll in place positioning of the head and arms as well positioning of the waist and knees, feet and ankle.  Hip positioners were placed in the front and the back.  We then prepped the right hip with DuraPrep and sterile drapes including sterile stockinette.  A time-out was called.  She was identified as correct patient, correct right hip.  We then made an incision over the greater trochanter and carried this proximally and distally.  I dissected down to the iliotibial band and then divided the IP band longitudinally and then proceeded with anterolateral approach to the hip.  I took down the small sleeve of gluteus medius and minimus tendons off the greater trochanter and retracted these anteriorly.  I was able to dissect down to the capsule and then opened up the hip capsule, found a large joint hematoma and displaced femoral neck fracture.  We placed Cobra retractors around the remnants of the femoral neck and then made our femoral neck cut with an oscillating saw and completed this with an osteotome.  We placed a corkscrew guide in the femoral head and removed the femoral head in its entirety.  We measured a size 42 was the appropriate head.  We then cleaned the acetabulum debris and turned attention to the femur.  We used an initiating reamer and a canal finding guide followed by lateralizing reamer and then began broaching using the Summit Basic Press-Fit broaching system from DePuy from size 1 up to size 4.  The size 4 was felt to be stable, so we trialed a 42 head with a +0 spacer and reduced this in the acetabulum, and I was pleased with the stability and her mobility and leg length.  I then dislocated the trial components and removed these.  We placed the real Summit Basic Press-Fit uncemented stem size 4 followed by the  real unipolar metal head with a size 42 with a +0 taper spacer and reduced this in the acetabulum and was stable.  We then copiously irrigated the soft tissue with normal saline solution.  We closed the joint capsule with interrupted #1 Ethibond suture.  We reapproximated the gluteus medius and minimus tendons back to the greater trochanter using #1 Ethibond.  We closed the iliotibial band with interrupted #1 Vicryl followed by 0 Vicryl in the deep tissue, 2-0 Vicryl in the subcutaneous tissue, and staples on the skin.  An Aquacel dressing was applied. Anesthesia was then working on slow wake up given her comorbidities.  Of note, Erskine Emery, PA-C assisted in the entire case and his assistance was crucial for facilitating this case.     Lind Guest. Ninfa Linden, M.D.     CYB/MEDQ  D:  07/29/2014  T:  07/30/2014  Job:  [id number]

## 2014-07-30 NOTE — Progress Notes (Signed)
Subjective:   POD #1   Extubated this am. Feels better. No dyspnea.     Intake/Output Summary (Last 24 hours) at 07/30/14 1538 Last data filed at 07/30/14 1514  Gross per 24 hour  Intake 395.88 ml  Output   2420 ml  Net -2024.12 ml    Current meds: . ALPRAZolam  1 mg Oral QHS  . antiseptic oral rinse  7 mL Mouth Rinse BID  . antiseptic oral rinse  7 mL Mouth Rinse QID  . aspirin EC  81 mg Oral Daily  . chlorhexidine  15 mL Mouth Rinse BID  . Chlorhexidine Gluconate Cloth  6 each Topical Daily  . docusate sodium  100 mg Oral BID  . DULoxetine  30 mg Oral QHS  . DULoxetine  60 mg Oral Daily  . enoxaparin (LOVENOX) injection  40 mg Subcutaneous Q24H  . ezetimibe-simvastatin  1 tablet Oral QHS  . fentaNYL  50 mcg Intravenous Once  . furosemide  80 mg Oral Daily  . ice pack  1 each Other 4 times per day  . insulin aspart  0-15 Units Subcutaneous TID WC  . insulin aspart  0-5 Units Subcutaneous QHS  . isosorbide mononitrate  90 mg Oral q morning - 10a  . levothyroxine  25 mcg Oral QAC breakfast  . metoprolol succinate  100 mg Oral Daily  . mupirocin ointment  1 application Nasal BID  . ondansetron  8 mg Oral BID  . [START ON 07/31/2014] pantoprazole  40 mg Oral Q1200  . potassium chloride SA  40 mEq Oral Daily   Infusions: . epoprostenol 33.45 ng/kg/min (07/28/14 1855)     Objective:  Blood pressure 127/45, pulse 97, temperature 101 F (38.3 C), temperature source Axillary, resp. rate 23, height 5' (1.524 m), weight 68.8 kg (151 lb 10.8 oz), SpO2 100.00%. Weight change:  Physical Exam:  General: Chronically-ill appearing. Lying flat in bed Wearing Zeba O2 HEENT: normal  Neck: thick . JVP ~5 Carotids 2+ bilat; no bruits. No lymphadenopathy or thryomegaly appreciated.  Cor: PMI nonpalpable Regular tachy. 2/6 TR. P2 perhaps mildly accentuated but not severely so  Hickman catheter ok  Lungs: clear but decreased throughout no wheezing  Abdomen: obese. soft, nontender,  nondistended. No hepatosplenomegaly. No bruits or masses. hypoactivends.  Extremities: no cyanosis, clubbing  Neuro: alert & oriented x 3, cranial nerves grossly intact. moves all 4 extremities w/o difficulty. Affect pleasant.    Telemetry: SR 80-90  Lab Results: Basic Metabolic Panel:  Recent Labs Lab 07/27/14 1530 07/28/14 0525 07/29/14 0520 07/29/14 1246 07/30/14 0217  NA 137 140 136* 134* 138  K 3.6* 3.2* 4.6 4.5 4.2  CL 94* 97 98  --  95*  CO2 31 32 29  --  29  GLUCOSE 85 44* 84 118* 115*  BUN 11 10 11   --  14  CREATININE 1.02 0.84 0.94  --  1.01  CALCIUM 8.6 8.5 8.4  --  8.6   Liver Function Tests: No results found for this basename: AST, ALT, ALKPHOS, BILITOT, PROT, ALBUMIN,  in the last 168 hours No results found for this basename: LIPASE, AMYLASE,  in the last 168 hours No results found for this basename: AMMONIA,  in the last 168 hours CBC:  Recent Labs Lab 07/27/14 1530 07/28/14 0525 07/29/14 0520 07/29/14 1246 07/30/14 0217  WBC 20.9* 15.9* 13.6*  --  23.9*  NEUTROABS 19.0*  --   --   --   --   HGB 10.4*  9.3* 8.9* 10.2* 9.6*  HCT 32.4* 28.4* 27.0* 30.0* 28.6*  MCV 86.2 84.0 84.6  --  83.4  PLT 307 278 261  --  251   Cardiac Enzymes: No results found for this basename: CKTOTAL, CKMB, CKMBINDEX, TROPONINI,  in the last 168 hours BNP: No components found with this basename: POCBNP,  CBG:  Recent Labs Lab 07/29/14 1938 07/30/14 0008 07/30/14 0402 07/30/14 0856 07/30/14 1146  GLUCAP 135* 104* 129* 138* 99   Microbiology: Lab Results  Component Value Date   CULT  Value: NO GROWTH Performed at [facility] 06/03/2014   CULT NO GROWTH 5 DAYS 06/03/2014   CULT NO GROWTH 5 DAYS 06/03/2014   CULT NO GROWTH 3 DAYS 05/14/2012   CULT NO GROWTH 5 DAYS 05/02/2012   No results found for this basename: CULT, SDES,  in the last 168 hours  Imaging: Dg Pelvis Portable  07/29/2014   CLINICAL DATA:  Postop right hip replacement for fracture  EXAM:  PORTABLE PELVIS 1-2 VIEWS  COMPARISON:  Pelvic CT 06/03/2014  FINDINGS: There are immediate postoperative changes of right hip arthroplasty. No hardware complication or periprosthetic fracture is identified. Expected skin staples and small amount of soft tissue gas are seen lateral to the proximal right femur.  IMPRESSION: Right hip arthroplasty.  No acute complicating features.   Electronically Signed   By: Curlene Dolphin M.D.   On: 07/29/2014 18:22   Portable Chest Xray  07/29/2014   CLINICAL DATA:  ET tube placement  EXAM: PORTABLE CHEST - 1 VIEW  COMPARISON:  07/27/2014  FINDINGS: Endotracheal tube with the tip 2.7 cm above the carina. Bilateral interstitial thickening. Trace bilateral pleural effusions. No pneumothorax. Stable mild cardiomegaly. Prior CABG. Unremarkable osseous structures.  IMPRESSION: Endotracheal tube with the tip 2.7 cm above the carina. Findings most concerning for mild pulmonary edema.   Electronically Signed   By: Kathreen Devoid   On: 07/29/2014 18:28     ASSESSMENT:  1. Hip fracture s/p repair 10/8 2. Severe PAH on flolan  3. CAD s/p CABG. LAD lesion on last cath does not appear to be flow limiting  4. Chronic Diastolic HF  5. H/O Of GI bleed from esophagitis  6. Hypokalemia  7. Diarrhea H/O C diff  8. Hypothyroid  9. DM    PLAN/DISCUSSION:  She is extubated and looks good. Volume status improved. Back on po lasix. Can go to floor. We will follow. Continue Flolan.   LOS: 3 days    Glori Bickers, MD 07/30/2014, 3:38 PM

## 2014-07-30 NOTE — Procedures (Signed)
Extubation Procedure Note  Patient Details:   Name: Meagan [name] DOB: March 23, 1946 MRN: [id number]   Airway Documentation:  Airway 7.5 mm (Active)  Secured at (cm) 23 cm 07/30/2014  7:39 AM  Measured From Lips 07/30/2014  7:39 AM  Secured Location Right 07/30/2014  7:39 AM  Secured By [employer] 07/30/2014  7:39 AM  Tube Holder Repositioned Yes 07/30/2014  7:39 AM  Cuff Pressure (cm H2O) 24 cm H2O 07/29/2014  7:16 PM  Site Condition Dry 07/30/2014  7:39 AM    Evaluation  O2 sats: stable throughout Complications: No apparent complications Patient did tolerate procedure well. Bilateral Breath Sounds: Clear;Diminished Suctioning: Oral;Airway Yes PT was extubated to 3L [place]. PT was able to tell me her name. PT sats are stable  Joeanthony Seeling, Leonie Douglas 07/30/2014, 10:00 AM

## 2014-07-30 NOTE — Progress Notes (Signed)
PULMONARY / CRITICAL CARE MEDICINE   Name: Meagan [name] MRN: [id number] DOB: 1946-05-26    ADMISSION DATE:  07/27/2014 CONSULTATION DATE:  07/29/14  REFERRING MD :  Dr. Sloan Leiter   CHIEF COMPLAINT:  Vent management   INITIAL PRESENTATION: 68 yo female with hx CHF, severe PAH on flolan, recent C Diff colitis admitted 10/6 after a fall with displaced R hip fx.  After surgical clearance by Cards, she underwent surgical repair R hip 10/8.  Post op failed extubation in PACU requiring reintubation and she was tx to ICU intubated and PCCM consulted for ICU management.   STUDIES:    SIGNIFICANT EVENTS: 10/8 R hip arthoplasty, failed extubation, to ICU on vent   SUBJECTIVE:  Passed SBT marginally. + F/C despite propofol and fent gtt. Extubated. Looks good initially post extubation. Adequate cough  VITAL SIGNS: Temp:  [97.8 F (36.6 C)-101 F (38.3 C)] 101 F (38.3 C) (10/09 0800) Pulse Rate:  [86-129] 110 (10/09 1100) Resp:  [12-28] 23 (10/09 1100) BP: (86-155)/(34-90) 114/41 mmHg (10/09 1100) SpO2:  [87 %-100 %] 99 % (10/09 1100) FiO2 (%):  [30 %-100 %] 30 % (10/09 0900) Weight:  [68.8 kg (151 lb 10.8 oz)] 68.8 kg (151 lb 10.8 oz) (10/08 1608) HEMODYNAMICS:   VENTILATOR SETTINGS: Vent Mode:  [-] PRVC FiO2 (%):  [30 %-100 %] 30 % Set Rate:  [12 bmp-14 bmp] 12 bmp Vt Set:  [450 mL] 450 mL PEEP:  [5 cmH20] 5 cmH20 Plateau Pressure:  [25 cmH20-30 cmH20] 25 cmH20 INTAKE / OUTPUT:  Intake/Output Summary (Last 24 hours) at 07/30/14 1113 Last data filed at 07/30/14 1000  Gross per 24 hour  Intake 1655.6 ml  Output   3570 ml  Net -1914.4 ml    PHYSICAL EXAMINATION: General: NAD Neuro: RASS -1, + F/C, no focal deficits HEENT: WNL Cardiovascular:  RRR, SEM 2/6 Lungs: Clear anteriorly Abdomen: soft, NT, +BS Ext: warm, no edema  LABS: I have reviewed all of today's lab results. Relevant abnormalities are discussed in the A/P section  CXR: NNF  ASSESSMENT /  PLAN:  PULMONARY OETT 10/8>>> 10/09 Severe PAH - on flolan, (Dr. Kelby Aline) Chronic respiratory failure - 6L home O2 Mild pulm edema (by CXR) P:   Monitor closely in ICU post extubation Supp O2 to maintain SpO2 > 93% Cont Flolan per protocol  CARDIOVASCULAR CAD - s/p CABG  Chronic dCHF  Sinus tachycardia P:  Cont statin, ASA, metoprolol per home regimen Metoprolol IV PRN to maintain HR < 115/min  RENAL A:   Mild metabolic alkalosis - likely related to loop diuresis  P:   Monitor BMET intermittently Monitor I/Os Correct electrolytes as indicated  GASTROINTESTINAL A:   Chronic PPI use P:   SUP: PO PPI Advance diet post as tolerated  HEMATOLOGIC A:   Chronic anemia without acute blood loss P:  DVT px: SQ LMWH Monitor CBC intermittently Transfuse per usual ICU guidelines  INFECTIOUS A:   Recent C diff - no ongoing diarrhea P:   Perioperative ancef Monitor clinically  ENDOCRINE A:   DM 2 Hypothyroidism P:   Mod scale ACHS SSI ordered 10/09 Holding Lantus for now (is on 8 units daily) Cont home dose of levothyroxine  NEUROLOGIC A:   Acute encephalopathy, resolved Chronic benzo use Post op pain P:   RASS goal: 0 Cont nocturnal Xanax as per home regimen PRN fentanyl Cont duloxetine  ORTHO: A: Closed R hip fracture P: Management per ortho   Family updated: 10/9  Interdisciplinary Family Meeting v Palliative Care Meeting:     TODAY'S SUMMARY:   I have personally obtained a history, examined the patient, evaluated laboratory and imaging results, formulated the assessment and plan and placed orders. CRITICAL CARE: The patient is critically ill with multiple organ systems failure and requires high complexity decision making for assessment and support, frequent evaluation and titration of therapies, application of advanced monitoring technologies and extensive interpretation of multiple databases. Critical Care Time devoted to patient care  services described in this note is 35 minutes.    Merton Border, MD ; [facility] [phone].  After 5:30 PM or weekends, call [phone]

## 2014-07-30 NOTE — Progress Notes (Signed)
UR completed. PT/OT evals will assist in determining pt's next best level of care.   Sandi Mariscal, RN BSN [place] CCM Trauma/Neuro ICU Case Manager [phone]

## 2014-07-30 NOTE — Progress Notes (Signed)
PT Cancellation Note  Patient Details Name: Meagan [name] MRN: [id number] DOB: 1946-07-01   Cancelled Treatment:    Reason Eval/Treat Not Completed: Patient not medically ready (still intubated)   Duncan Dull 07/30/2014, 10:06 AM Alben Deeds, PT DPT  [phone]

## 2014-07-30 NOTE — Progress Notes (Signed)
Patient ID: Meagan [name], female   DOB: 11/16/1945, 68 y.o.   MRN: [id number] Much improved today medically.  Off of the vent.  Is up in a chair.  I shared x-rays with family at the bedside.  She did have an anterior-lateral approach, so she needs anterior hip precautions with therapy.  Her dressing is clean and dry over her right hip incision.  Due to mobility issues, she will likely need short-term skilled nursing placement.

## 2014-07-30 NOTE — Discharge Instructions (Signed)
Full-weight bearing as tolerated right hip; anterior hip precautions. Can get current right hip dressing wet in the shower. Please leave the current dressing on (Aquacell protective dressing) and do not touch or remove it until her outpatient orthopedic follow-up

## 2014-07-30 NOTE — Progress Notes (Signed)
Occupational Therapy Treatment Patient Details Name: Meagan [name] MRN: [id number] DOB: 1945-12-13 Today's Date: 07/30/2014    History of present illness 68 yo female with hx CHF, severe PAH on flolan, recent C Diff colitis admitted 10/6 after a fall with displaced R hip fx.  After surgical clearance by Cards, she underwent surgical repair R hip 10/8.  Post op failed extubation in PACU requiring reintubation and she was tx to ICU intubated and PCCM consulted for ICU management.    OT comments  Seen for second session to assist nursing with functional transfers in addition to attempting to increase the pt/families awareness for the need of rehab prior to D/C. Also discussed with ortho, who spoke to pt. After observing second session, Husband appears to understand the need for rehab prior to return home. O2 sats maintained > 90 throughout session on 6 L. Will continue to follow acutely.   Follow Up Recommendations  CIR;Supervision/Assistance - 24 hour    Equipment Recommendations  None recommended by OT    Recommendations for Other Services Rehab consult    Precautions / Restrictions Precautions Precautions: Other (comment) (ant/lateral hip -R) Precaution Booklet Issued: No Restrictions Weight Bearing Restrictions: Yes RLE Weight Bearing: Weight bearing as tolerated       Mobility Bed Mobility Overal bed mobility: +2 for physical assistance;Needs Assistance Bed Mobility: Supine to Sit;Sit to Supine      Sit to supine: Max assist;+2 for physical assistance   General bed mobility comments: Pt able to assist with arms getting into bed  Transfers Overall transfer level: Needs assistance Equipment used: 2 person hand held assist Transfers: Sit to/from [facility] Sit to Stand: Total assist;+2 physical assistance Stand pivot transfers: Total assist;+2 physical assistance       General transfer comment: BSC trasnfers/ moved recliner and placed BSC under pt     Balance Overall balance assessment: Needs assistance Sitting-balance support: Feet supported Sitting balance-Leahy Scale: Poor Sitting balance - Comments: posterior lean. Max cues to use B hands to maintain balance   Standing balance support: During functional activity Standing balance-Leahy Scale: Zero (significant posterior lean)                     ADL Overall ADL's : Needs assistance/impaired        Toilet Transfer: +2 for physical assistance;Total assistance;BSC   Toileting- Clothing Manipulation and Hygiene: +2 for physical assistance;Sit to/from stand (+3 required to clean bottom in standing.)       Functional mobility during ADLs: +2 for physical assistance;Total assistance;Rolling walker;Cueing for sequencing;Cueing for safety General ADL Comments: Assisted nursing with toilet transfer and hygiene after toileitng , requiring +2 skilled assistance using gait belt and bariatric transfer technique.  Educating nursing on safest way for mobility - rec staff use maximove at this time.      Vision                     Perception     Praxis      Cognition   Behavior During Therapy: Anxious Overall Cognitive Status: Impaired/Different from baseline Area of Impairment: Attention;Memory;Safety/judgement;Awareness;Problem solving     Memory: Decreased short-term memory    Safety/Judgement: Decreased awareness of safety;Decreased awareness of deficits Awareness: Intellectual Problem Solving: Slow processing;Difficulty sequencing General Comments: Question if pt has some baseline cogntivie deficits. apparently imipaired.     Extremity/Trunk Assessment  Upper Extremity Assessment Upper Extremity Assessment: Generalized weakness   Lower Extremity Assessment Lower Extremity Assessment:  Generalized weakness;Defer to PT evaluation RLE Deficits / Details: Surgical incision RLE: Unable to fully assess due to pain RLE Coordination: decreased gross motor    Cervical / Trunk Assessment Cervical / Trunk Assessment: Kyphotic;Other exceptions Cervical / Trunk Exceptions: unable to achieve full upright posture    Exercises     Shoulder Instructions       General Comments      Pertinent Vitals/ Pain       Pain Assessment: Faces Faces Pain Scale: Hurts little more Pain Location: R hip Pain Descriptors / Indicators: Aching Pain Intervention(s): Limited activity within patient's tolerance;Monitored during session;Repositioned;Ice applied  Home Living Family/patient expects to be discharged to:: Private residence Living Arrangements: Spouse/significant other Available Help at Discharge: Family;Available 24 hours/day Type of Home: House Home Access: Stairs to enter [employer] of Steps: 2 Entrance Stairs-Rails: None Home Layout: One level     Bathroom Shower/Tub: Occupational psychologist: Standard Bathroom Accessibility: Yes How Accessible: Accessible via walker Home Equipment: [place] - 4 wheels;Cane - single point;Shower seat - built in;Bedside commode;Electric scooter;Grab bars - tub/shower          Prior Functioning/Environment Level of Independence: Independent        Comments: Husband assists with bathing due to pump for pulmonary hypertension. Pt has not driven in a year. Can go out in the community when she feels good. On bad days she stays at home in bed mostly.    Frequency Min 2X/week     Progress Toward Goals  OT Goals(current goals can now be found in the care plan section)  Progress towards OT goals: Progressing toward goals  Acute Rehab OT Goals Patient Stated Goal: to go home OT Goal Formulation: With patient/family Time For Goal Achievement: 08/13/14 Potential to Achieve Goals: Good ADL Goals Pt Will Perform Lower Body Bathing: with caregiver independent in assisting;with adaptive equipment;sit to/from stand;with mod assist Pt Will Perform Lower Body Dressing: with adaptive  equipment;with caregiver independent in assisting;sit to/from stand;with mod assist Pt Will Transfer to Toilet: with mod assist;bedside commode;ambulating Pt Will Perform Toileting - Clothing Manipulation and hygiene: with min assist;sit to/from stand;sitting/lateral leans Additional ADL Goal #1: Complete bed mobility with mod  A in preparation for ADL  Plan Discharge plan remains appropriate    Co-evaluation      Reason for Co-Treatment: Complexity of the patient's impairments (multi-system involvement);For patient/therapist safety PT goals addressed during session: Mobility/safety with mobility        End of Session Equipment Utilized During Treatment: Gait belt;Oxygen   Activity Tolerance Patient tolerated treatment well   Patient Left in chair;with call bell/phone within reach;with nursing/sitter in room;with family/visitor present   Nurse Communication Mobility status;Need for lift equipment (recommend nsg use maximove)   Co-treat with PT due to medical complexity and pt/therapist safety. OT addressing ADL     Time: 9528-4132 OT Time Calculation (min): 35 min  Charges: OT General Charges $OT Visit: 1 Procedure  OT Treatments $Self Care/Home Management : 8-22 mins  Tamee Battin,HILLARY 07/30/2014, 5:57 PM   [facility], OTR/L  [phone] 07/30/2014

## 2014-07-30 NOTE — Progress Notes (Signed)
OT Cancellation Note  Patient Details Name: Meagan [name] MRN: [id number] DOB: Jul 30, 1946   Cancelled Treatment:    Reason Eval/Treat Not Completed: Patient not medically ready (still intubated. will see today if appropriate.)  [facility], OTR/L  [phone] 07/30/2014 07/30/2014, 10:05 AM

## 2014-07-30 NOTE — Evaluation (Signed)
Physical Therapy Evaluation Patient Details Name: Meagan [name] MRN: [id number] DOB: 09-06-1946 Today's Date: 07/30/2014   History of Present Illness  68 yo female with hx CHF, severe PAH on flolan, recent C Diff colitis admitted 10/6 after a fall with displaced R hip fx.  After surgical clearance by Cards, she underwent surgical repair R hip 10/8.  Post op failed extubation in PACU requiring reintubation and she was tx to ICU intubated and PCCM consulted for ICU management.   Clinical Impression  Patient demonstrates deficits in functional mobility as indicated below. Will benefit from continued skilled PT to address deficits and maximize function. Will see as indicated and progress as tolerated. Patient currently demonstrates poor awareness of deficits and level of assist required. Spoke with patient and spouse regarding concerns for assist, patient will need continued rehabilitation prior to dc home. OF NOTE: patient is on continuous pump for PAH, management of pump with mobility will be a concern.     Follow Up Recommendations CIR;Supervision/Assistance - 24 hour    Equipment Recommendations  Other (comment) (TBD)    Recommendations for Other Services Rehab consult     Precautions / Restrictions Precautions Precautions: Anterior Hip (anterolateral) Precaution Booklet Issued: No Restrictions Weight Bearing Restrictions: Yes RLE Weight Bearing: Weight bearing as tolerated      Mobility  Bed Mobility Overal bed mobility: Needs Assistance;+2 for physical assistance Bed Mobility: Supine to Sit;Sit to Supine     Supine to sit: Max assist;+2 for physical assistance Sit to supine: Max assist;+2 for physical assistance   General bed mobility comments: Patient able to initate some movement with LLE and trunk  Transfers Overall transfer level: Needs assistance Equipment used: Rolling walker (2 wheeled) Transfers: Sit to/from [facility] Sit to Stand: Total  assist;+2 physical assistance Stand pivot transfers: Total assist;+2 physical assistance          Ambulation/Gait                Stairs            Wheelchair Mobility    Modified Rankin (Stroke Patients Only)       Balance Overall balance assessment: Needs assistance Sitting-balance support: Feet supported Sitting balance-Leahy Scale: Fair     Standing balance support: During functional activity Standing balance-Leahy Scale: Zero                               Pertinent Vitals/Pain Pain Assessment: 0-10    Home Living Family/patient expects to be discharged to:: Private residence Living Arrangements: Spouse/significant other Available Help at Discharge: Family;Available 24 hours/day Type of Home: House Home Access: Stairs to enter Entrance Stairs-Rails: None Entrance Stairs-Number of Steps: 2 Home Layout: One level Home Equipment: [place] - 4 wheels;Cane - single point;Shower seat - built in;Bedside commode;Electric scooter;Grab bars - tub/shower      Prior Function Level of Independence: Independent         Comments: Husband assists with bathing due to pump for pulmonary hypertension. Pt has not driven in a year. Can go out in the community when she feels good. On bad days she stays at home in bed mostly.      Hand Dominance   Dominant Hand: Right    Extremity/Trunk Assessment   Upper Extremity Assessment: Generalized weakness           Lower Extremity Assessment: Generalized weakness;RLE deficits/detail RLE Deficits / Details: Surgical incision  Communication   Communication: HOH  Cognition Arousal/Alertness: Awake/alert Behavior During Therapy: Anxious Overall Cognitive Status: Impaired/Different from baseline Area of Impairment: Awareness           Awareness: Intellectual        General Comments      Exercises        Assessment/Plan    PT Assessment Patient needs continued PT services  PT  Diagnosis Difficulty walking;Acute pain   PT Problem List Decreased strength;Decreased range of motion;Decreased activity tolerance;Decreased balance;Decreased mobility;Decreased safety awareness;Cardiopulmonary status limiting activity;Pain  PT Treatment Interventions DME instruction;Gait training;Stair training;Functional mobility training;Therapeutic activities;Therapeutic exercise;Balance training;Patient/family education   PT Goals (Current goals can be found in the Care Plan section) Acute Rehab PT Goals Patient Stated Goal: to go home PT Goal Formulation: With patient/family Time For Goal Achievement: 08/13/14 Potential to Achieve Goals: Fair    Frequency Min 3X/week   Barriers to discharge        Co-evaluation PT/OT/SLP Co-Evaluation/Treatment: Yes Reason for Co-Treatment: Complexity of the patient's impairments (multi-system involvement);For patient/therapist safety PT goals addressed during session: Mobility/safety with mobility         End of Session Equipment Utilized During Treatment: Gait belt;Oxygen Activity Tolerance: Patient limited by fatigue;Patient limited by pain Patient left: in chair;with call bell/phone within reach;with family/visitor present Nurse Communication: Mobility status         Time: 8466-5993 PT Time Calculation (min): 41 min   Charges:   PT Evaluation $Initial PT Evaluation Tier I: 1 Procedure PT Treatments $Therapeutic Activity: 8-22 mins   PT G CodesDuncan Dull 07/30/2014, 5:23 PM Alben Deeds, [place] DPT  [phone]

## 2014-07-30 NOTE — Progress Notes (Addendum)
Occupational Therapy Evaluation Patient Details Name: LAURENA VALKO MRN: [id number] DOB: [birth year] Today's Date: 07/30/2014    History of Present Illness 68 yo female with hx CHF, severe PAH on flolan, recent C Diff colitis admitted 10/6 after a fall with displaced R hip fx.  After surgical clearance by Cards, she underwent surgical repair R hip 10/8.  Post op failed extubation in PACU requiring reintubation and she was tx to ICU intubated and PCCM consulted for ICU management.    Clinical Impression   PTA, pt lived at home with husband. Pt was mod I with mobility and ADL with the exception of bathing due to "pump" for hypertension. Pt currently requires total A +2 with mobility and total A for ADL. Pt does not want SNF placement but needs rehab to return to PLOF. At this time, recommend CIR consult to maximize functional level of independence to a level where her husband can assist her at home. Pt will benefit from skilled OT services to facilitate D/C to next venue due to below deficits.    Follow Up Recommendations  CIR;Supervision/Assistance - 24 hour    Equipment Recommendations  None recommended by OT    Recommendations for Other Services Rehab consult     Precautions / Restrictions Precautions Precautions: Other (comment) (anterolateral) Precaution Booklet Issued: No Restrictions Weight Bearing Restrictions: Yes RLE Weight Bearing: Weight bearing as tolerated      Mobility Bed Mobility Overal bed mobility: Needs Assistance;+2 for physical assistance Bed Mobility: Supine to Sit;Sit to Supine     Supine to sit: Total assist;+2 for physical assistance Sit to supine: Max assist;+2 for physical assistance   General bed mobility comments: anxiety impeding bed mobility  Transfers Overall transfer level: Needs assistance Equipment used:  (face to face with gait belt and +2 total assist) Transfers: Sit to/from Stand;Stand Pivot Transfers Sit to Stand: Total assist;+2  physical assistance Stand pivot transfers: Total assist;+2 physical assistance       General transfer comment: transfer from chair to [facility] and from [facility] to bed with assist, OT present for hygiene and assist.    Balance Overall balance assessment: Needs assistance Sitting-balance support: Feet supported Sitting balance-Leahy Scale: Poor Sitting balance - Comments: posterior lean. Max cues to use B hands to maintain balance   Standing balance support: During functional activity;Bilateral upper extremity supported Standing balance-Leahy Scale: Zero                              ADL Overall ADL's : Needs assistance/impaired     Grooming: Set up;Supervision/safety;Sitting   Upper Body Bathing: Moderate assistance;Sitting   Lower Body Bathing: Total assistance;Sit to/from stand   Upper Body Dressing : Moderate assistance;Sitting   Lower Body Dressing: Total assistance;Sit to/from stand   Toilet Transfer: +2 for physical assistance;Total assistance;BSC   Toileting- Clothing Manipulation and Hygiene: +2 for physical assistance;Total assistance;Sit to/from stand       Functional mobility during ADLs: +2 for physical assistance;Total assistance;Rolling walker;Cueing for sequencing;Cueing for safety General ADL Comments: Pt with poor awareness of deficits. Required +2 total A and pt does not understand why she can not go home     Vision                     Perception     Praxis      Pertinent Vitals/Pain Pain Assessment: Faces Faces Pain Scale: Hurts little more Pain Location: R hip Pain  Descriptors / Indicators: Aching Pain Intervention(s): Limited activity within patient's tolerance;Monitored during session;Repositioned;Ice applied     Hand Dominance Right   Extremity/Trunk Assessment Upper Extremity Assessment Upper Extremity Assessment: Generalized weakness   Lower Extremity Assessment Lower Extremity Assessment: Generalized weakness;Defer to  PT evaluation RLE Deficits / Details: Surgical incision RLE: Unable to fully assess due to pain RLE Coordination: decreased gross motor   Cervical / Trunk Assessment Cervical / Trunk Assessment: Kyphotic;Other exceptions Cervical / Trunk Exceptions: unable to achieve full upright posture   Communication Communication Communication: HOH   Cognition Arousal/Alertness: Awake/alert Behavior During Therapy: Anxious Overall Cognitive Status: Impaired/Different from baseline Area of Impairment: Attention;Memory;Safety/judgement;Awareness;Problem solving     Memory: Decreased short-term memory   Safety/Judgement: Decreased awareness of safety;Decreased awareness of deficits Awareness: Intellectual Problem Solving: Slow processing;Difficulty sequencing General Comments: Question if pt has some baseline cogntivie deficits. apparently imipaired.    General Comments       Exercises       Shoulder Instructions      Home Living Family/patient expects to be discharged to:: Private residence Living Arrangements: Spouse/significant other Available Help at Discharge: Family;Available 24 hours/day Type of Home: House Home Access: Stairs to enter [employer] of Steps: 2 Entrance Stairs-Rails: None Home Layout: One level     Bathroom Shower/Tub: Occupational psychologist: Standard Bathroom Accessibility: Yes How Accessible: Accessible via walker Home Equipment: [place] - 4 wheels;Cane - single point;Shower seat - built in;Bedside commode;Electric scooter;Grab bars - tub/shower          Prior Functioning/Environment Level of Independence: Independent        Comments: Husband assists with bathing due to pump for pulmonary hypertension. Pt has not driven in a year. Can go out in the community when she feels good. On bad days she stays at home in bed mostly.     OT Diagnosis: Generalized weakness;Cognitive deficits;Acute pain   OT Problem List: Decreased  strength;Decreased range of motion;Decreased activity tolerance;Impaired balance (sitting and/or standing);Decreased safety awareness;Decreased knowledge of use of DME or AE;Decreased knowledge of precautions;Cardiopulmonary status limiting activity;Obesity;Pain   OT Treatment/Interventions: Self-care/ADL training;Therapeutic exercise;Energy conservation;DME and/or AE instruction;Therapeutic activities;Cognitive remediation/compensation;Patient/family education;Balance training    OT Goals(Current goals can be found in the care plan section) Acute Rehab OT Goals Patient Stated Goal: to go home OT Goal Formulation: With patient/family Time For Goal Achievement: 08/13/14 Potential to Achieve Goals: Good  OT Frequency: Min 2X/week   Barriers to D/C:            Co-evaluation   Reason for Co-Treatment: Complexity of the patient's impairments (multi-system involvement);For patient/therapist safety PT goals addressed during session: Mobility/safety with mobility        End of Session Equipment Utilized During Treatment: Gait belt;Rolling walker;Oxygen Nurse Communication: Mobility status;Precautions  Activity Tolerance: Patient tolerated treatment well Patient left: in chair;with call bell/phone within reach;with family/visitor present   Time: 1445-1520 OT Time Calculation (min): 35 min Charges:  OT General Charges $OT Visit: 1 Procedure OT Evaluation $Initial OT Evaluation Tier I: 1 Procedure OT Treatments $Self Care/Home Management : 8-22 mins G-Codes:    Christyne Mccain,HILLARY 2014/07/31, 5:41 PM  Co-treat with PT due to medical complexity and pt/therapist safety. OT addressing ADL.  [facility], OTR/L  [phone] 07/31/14 [place], OTR/L  [phone] 07-31-14

## 2014-07-31 DIAGNOSIS — I2510 Atherosclerotic heart disease of native coronary artery without angina pectoris: Secondary | ICD-10-CM

## 2014-07-31 LAB — GLUCOSE, CAPILLARY
GLUCOSE-CAPILLARY: 168 mg/dL — AB (ref 70–99)
Glucose-Capillary: 199 mg/dL — ABNORMAL HIGH (ref 70–99)
Glucose-Capillary: 179 mg/dL — ABNORMAL HIGH (ref 70–99)
Glucose-Capillary: 136 mg/dL — ABNORMAL HIGH (ref 70–99)
Glucose-Capillary: 149 mg/dL — ABNORMAL HIGH (ref 70–99)

## 2014-07-31 LAB — BASIC METABOLIC PANEL
ANION GAP: 14 (ref 5–15)
BUN: 17 mg/dL (ref 6–23)
CO2: 29 meq/L (ref 19–32)
Calcium: 8.5 mg/dL (ref 8.4–10.5)
Chloride: 91 mEq/L — ABNORMAL LOW (ref 96–112)
Creatinine, Ser: 0.98 mg/dL (ref 0.50–1.10)
GFR calc Af Amer: 67 mL/min — ABNORMAL LOW (ref >90)
GFR calc non Af Amer: 58 mL/min — ABNORMAL LOW (ref >90)
Glucose, Bld: 139 mg/dL — ABNORMAL HIGH (ref 70–99)
POTASSIUM: 3.5 meq/L — AB (ref 3.7–5.3)
Sodium: 134 mEq/L — ABNORMAL LOW (ref 137–147)

## 2014-07-31 LAB — CBC
HCT: 24.8 % — ABNORMAL LOW (ref 36.0–46.0)
Hemoglobin: 8.4 g/dL — ABNORMAL LOW (ref 12.0–15.0)
MCH: 27.9 pg (ref 26.0–34.0)
MCHC: 33.9 g/dL (ref 30.0–36.0)
MCV: 82.4 fL (ref 78.0–100.0)
Platelets: 268 10*3/uL (ref 150–400)
RBC: 3.01 MIL/uL — ABNORMAL LOW (ref 3.87–5.11)
RDW: 13.8 % (ref 11.5–15.5)
WBC: 25.9 10*3/uL — ABNORMAL HIGH (ref 4.0–10.5)

## 2014-07-31 MED ORDER — POTASSIUM CHLORIDE CRYS ER 20 MEQ PO TBCR: 20.0000 meq | EXTENDED_RELEASE_TABLET | ORAL | Status: DC

## 2014-07-31 MED ORDER — HYDROMORPHONE HCL 2 MG PO TABS
4.0000 mg | ORAL_TABLET | Freq: Two times a day (BID) | ORAL | Status: DC
  Administered 2014-07-31 – 2014-08-03 (×7): 4 mg via ORAL
  Filled 2014-07-31 (×7): qty 2

## 2014-07-31 MED ORDER — INSULIN GLARGINE 100 UNIT/ML ~~LOC~~ SOLN
5.0000 [IU] | Freq: Every day | SUBCUTANEOUS | Status: DC
  Administered 2014-07-31 – 2014-08-01 (×2): 5 [IU] via SUBCUTANEOUS
  Filled 2014-07-31 (×3): qty 0.05

## 2014-07-31 NOTE — Progress Notes (Addendum)
PROGRESS NOTE  Meagan [name] QQV:[id number] DOB: 1946/07/23 DOA: 07/27/2014 PCP: Glo Herring., MD  Meagan [name] is a 68 y.o. female with past medical history of CHF, severe PAH on continuous Flolan infusion, recent C. difficile colitis, history of MI and IDDM. She was brought to the hospital after she fell and broke her right hip. Patient said this morning she was getting ready to see her urologist, she got out of the bathroom and she fell.  She experienced severe right hip pain, and couldn't get up.  She was brought to the hospital for further evaluation. Patient denies any shortness of breath more than usual.  Per her husband she's been unsteady since discharge from the hospital recently for BRBPR and C. difficile colitis.  In the ED x-ray of the right hip showed right femoral neck fracture with displacement, orthopedics Dr. Erlinda Hong consulted.   Assessment/Plan: Closed right hip fracture.   -s/p R hip arthroplasty -was in ICU post op, Tx to floor since extubated  -pain control -Physical therapy -Lovenox for DVt proph -will need SNF but wants to go home  Severe Pulmonary Hypertension -Patient is on continuous Flolan infusion, patient follows with Dr. Gilles Chiquito in [place].  -Appears to be controlled, last 2-D echo done in August showed normal systolic pressure on FLolan  Diabetes mellitus, insulin-dependent  -resume Lantus at 5 units, SSI -hemoglobin A1c 6.3  Leukocytosis -afebrile -no cough/ congestion, no diarrhea -if develops diarrhea, check Cdiff PCR -monitor for now  Recent history of C. difficile diarrhea  -Avoid unnecessary antibiotics and PPIs.   Chronic diastolic CHF  -LVF from echo done on 06/05/2014 showed LVEF of 68% and grade 1 diastolic dysfunction.  -Patient appears to be euvolemic.  -Has chronic baseline of shortness of breath likely secondary to chronic diastolic CHF and severe PAH. -Continue lasix, imdur, metoprolol  Normocytic anemia -With some drop post  op -Monitor, transfuse if <7  Chronic respiratory failure on home oxygen/severe pulmonary hypertension/restrictive lung disease - Continue 6L Home oxygen and outpatient followup with [facility].  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 Has had urology evaluation during that admission, apparently has had outpatient urology followup as well.  DVT Prophylaxis:  lovenox  Code Status: full Family Communication: husband at bedside. Disposition Plan: will need SNF at discharge but wants to go home.   Consultants:  Orthopedic surgery  Cardiology   CHF  Procedures:    Antibiotics: Anti-infectives   Start     Dose/Rate Route Frequency Ordered Stop   07/29/14 1800  ceFAZolin (ANCEF) IVPB 2 g/50 mL premix     2 g 100 mL/hr over 30 Minutes Intravenous Every 6 hours 07/29/14 1625 07/30/14 0004        HPI/Subjective: Patient reports pain is controlled, no new complaints, declines rehab  Objective: Filed Vitals:   07/31/14 0700 07/31/14 0800 07/31/14 0900 07/31/14 1000  BP: 105/58 129/47 128/44 123/38  Pulse: 102 103 106 109  Temp:  98.3 F (36.8 C)    TempSrc:  Oral    Resp: 23 24 27 30   Height:      Weight:      SpO2: 100% 100% 100% 100%    Intake/Output Summary (Last 24 hours) at 07/31/14 1206 Last data filed at 07/31/14 1000  Gross per 24 hour  Intake   1120 ml  Output   1150 ml  Net    -30 ml   [employer]  07/27/14 1502 07/27/14 2010 07/29/14 1608  Weight: 68.04 kg (150 lb) 66.4 kg (146 lb 6.2 oz) 68.8 kg (151 lb 10.8 oz)    Exam: General: WD, elderly female, chronically ill appearing  HEENT:  EOMI, Anicteic Sclera, MMM. No pharyngeal erythema or exudates  Neck: Supple, no JVD, no masses  Cardiovascular: RRR, S1 S2 auscultated, no rubs, or gallops.   Respiratory: Clear to auscultation bilaterally with equal chest rise.  On oxygen Abdomen: Obese, Soft, nontender,  nondistended, + bowel sounds  Extremities:  R hip with dressing C/d/i Neuro: AAOx3, cranial nerves grossly intact.  Skin: Without rashes exudates or nodules.     Data Reviewed: Basic Metabolic Panel:  Recent Labs Lab 07/27/14 1530 07/28/14 0525 07/29/14 0520 07/29/14 1246 07/30/14 0217 07/31/14 0216  NA 137 140 136* 134* 138 134*  K 3.6* 3.2* 4.6 4.5 4.2 3.5*  CL 94* 97 98  --  95* 91*  CO2 31 32 29  --  29 29  GLUCOSE 85 44* 84 118* 115* 139*  BUN 11 10 11   --  14 17  CREATININE 1.02 0.84 0.94  --  1.01 0.98  CALCIUM 8.6 8.5 8.4  --  8.6 8.5   CBC:  Recent Labs Lab 07/27/14 1530 07/28/14 0525 07/29/14 0520 07/29/14 1246 07/30/14 0217 07/31/14 0216  WBC 20.9* 15.9* 13.6*  --  23.9* 25.9*  NEUTROABS 19.0*  --   --   --   --   --   HGB 10.4* 9.3* 8.9* 10.2* 9.6* 8.4*  HCT 32.4* 28.4* 27.0* 30.0* 28.6* 24.8*  MCV 86.2 84.0 84.6  --  83.4 82.4  PLT 307 278 261  --  251 268   BNP (last 3 results)  Recent Labs  02/23/14 1006  PROBNP 803.2*   CBG:  Recent Labs Lab 07/30/14 0856 07/30/14 1146 07/30/14 1658 07/30/14 2128 07/31/14 0806  GLUCAP 138* 99 202* 199* 168*    Recent Results (from the past 240 hour(s))  SURGICAL PCR SCREEN     Status: Abnormal   Collection Time    07/28/14  2:42 AM      Result Value Ref Range Status   MRSA, PCR NEGATIVE  NEGATIVE Final   Staphylococcus aureus POSITIVE (*) NEGATIVE Final   Comment:            The Xpert SA Assay (FDA     approved for NASAL specimens     in patients over 70 years of age),     is one component of     a comprehensive surveillance     program.  Test performance has     been validated by [employer] for patients greater     than or equal to 47 year old.     It is not intended     to diagnose infection nor to     guide or monitor treatment.     Studies: Dg Pelvis Portable  07/29/2014   CLINICAL DATA:  Postop right hip replacement for fracture  EXAM: PORTABLE PELVIS 1-2 VIEWS   COMPARISON:  Pelvic CT 06/03/2014  FINDINGS: There are immediate postoperative changes of right hip arthroplasty. No hardware complication or periprosthetic fracture is identified. Expected skin staples and small amount of soft tissue gas are seen lateral to the proximal right femur.  IMPRESSION: Right hip arthroplasty.  No acute complicating features.   Electronically Signed   By: Curlene Dolphin M.D.   On: 07/29/2014 18:22   Portable  Chest Xray  07/29/2014   CLINICAL DATA:  ET tube placement  EXAM: PORTABLE CHEST - 1 VIEW  COMPARISON:  07/27/2014  FINDINGS: Endotracheal tube with the tip 2.7 cm above the carina. Bilateral interstitial thickening. Trace bilateral pleural effusions. No pneumothorax. Stable mild cardiomegaly. Prior CABG. Unremarkable osseous structures.  IMPRESSION: Endotracheal tube with the tip 2.7 cm above the carina. Findings most concerning for mild pulmonary edema.   Electronically Signed   By: Kathreen Devoid   On: 07/29/2014 18:28    Scheduled Meds: . ALPRAZolam  1 mg Oral QHS  . aspirin EC  81 mg Oral Daily  . docusate sodium  100 mg Oral BID  . DULoxetine  30 mg Oral QHS  . DULoxetine  60 mg Oral Daily  . enoxaparin (LOVENOX) injection  40 mg Subcutaneous Q24H  . ezetimibe-simvastatin  1 tablet Oral QHS  . furosemide  80 mg Oral Daily  . HYDROmorphone  4 mg Oral BID  . ice pack  1 each Other 4 times per day  . insulin aspart  0-15 Units Subcutaneous TID WC  . insulin aspart  0-5 Units Subcutaneous QHS  . isosorbide mononitrate  90 mg Oral q morning - 10a  . levothyroxine  25 mcg Oral QAC breakfast  . metoprolol succinate  100 mg Oral Daily  . ondansetron  8 mg Oral BID  . pantoprazole  40 mg Oral Q1200  . potassium chloride SA  40 mEq Oral Daily   Continuous Infusions: . epoprostenol 33.45 ng/kg/min (07/28/14 1855)    Principal Problem:   Closed right hip fracture Active Problems:   DM (diabetes mellitus), type 2, uncontrolled   PAH (pulmonary artery  hypertension)   Acute on chronic diastolic heart failure   Reflux esophagitis   C. difficile diarrhea   CKD (chronic kidney disease), stage III   Pre-operative cardiovascular examination   Right renal mass   Anemia  . If 7PM-7AM, please contact night-coverage at [email or web address], password [facility] 07/31/2014, 12:06 PM  LOS: 4 days   Domenic Polite, MD [phone]

## 2014-07-31 NOTE — Progress Notes (Signed)
Client received to 3E01 from 3M05 via bed.  Dressing to right hip dry and intact.  Neurvascular checks within normal.  Pillow between knees per protocol.  IV right ac, saline locked and dry and intact.  Flolan is infusing as ordered.  Oriented to surroundings.  Respiratory exchange even and unlabored.  Oxygen at 6L/minute via nasal cannula.

## 2014-07-31 NOTE — Progress Notes (Signed)
Subjective:   POD #2  Mild jaw claudication and hip pain. No dyspnea or CP.    Intake/Output Summary (Last 24 hours) at 07/31/14 0940 Last data filed at 07/30/14 1800  Gross per 24 hour  Intake   1130 ml  Output   1025 ml  Net    105 ml    Current meds: . ALPRAZolam  1 mg Oral QHS  . aspirin EC  81 mg Oral Daily  . Chlorhexidine Gluconate Cloth  6 each Topical Daily  . docusate sodium  100 mg Oral BID  . DULoxetine  30 mg Oral QHS  . DULoxetine  60 mg Oral Daily  . enoxaparin (LOVENOX) injection  40 mg Subcutaneous Q24H  . ezetimibe-simvastatin  1 tablet Oral QHS  . furosemide  80 mg Oral Daily  . ice pack  1 each Other 4 times per day  . insulin aspart  0-15 Units Subcutaneous TID WC  . insulin aspart  0-5 Units Subcutaneous QHS  . isosorbide mononitrate  90 mg Oral q morning - 10a  . levothyroxine  25 mcg Oral QAC breakfast  . metoprolol succinate  100 mg Oral Daily  . mupirocin ointment  1 application Nasal BID  . ondansetron  8 mg Oral BID  . pantoprazole  40 mg Oral Q1200  . potassium chloride SA  40 mEq Oral Daily   Infusions: . epoprostenol 33.45 ng/kg/min (07/28/14 1855)     Objective:  Blood pressure 128/44, pulse 106, temperature 98.3 F (36.8 C), temperature source Oral, resp. rate 27, height 5' (1.524 m), weight 68.8 kg (151 lb 10.8 oz), SpO2 100.00%. Weight change:  Physical Exam:  General: Chronically-ill appearing. Lying flat in bed Wearing [place] O2 HEENT: normal  Neck: thick . JVP ~5 Carotids 2+ bilat; no bruits. No lymphadenopathy or thryomegaly appreciated.  Cor: PMI nonpalpable Regular tachy. 2/6 TR. P2 perhaps mildly accentuated but not severely so  Hickman catheter ok  Lungs: clear but decreased throughout no wheezing  Abdomen: obese. soft, nontender, nondistended. No hepatosplenomegaly. No bruits or masses. hypoactivends.  Extremities: no cyanosis, clubbing  Neuro: alert & oriented x 3, cranial nerves grossly intact. moves all 4 extremities  w/o difficulty. Affect pleasant.    Telemetry: SR 100-110  Lab Results: Basic Metabolic Panel:  Recent Labs Lab 07/27/14 1530 07/28/14 0525 07/29/14 0520 07/29/14 1246 07/30/14 0217 07/31/14 0216  NA 137 140 136* 134* 138 134*  K 3.6* 3.2* 4.6 4.5 4.2 3.5*  CL 94* 97 98  --  95* 91*  CO2 31 32 29  --  29 29  GLUCOSE 85 44* 84 118* 115* 139*  BUN 11 10 11   --  14 17  CREATININE 1.02 0.84 0.94  --  1.01 0.98  CALCIUM 8.6 8.5 8.4  --  8.6 8.5   Liver Function Tests: No results found for this basename: AST, ALT, ALKPHOS, BILITOT, PROT, ALBUMIN,  in the last 168 hours No results found for this basename: LIPASE, AMYLASE,  in the last 168 hours No results found for this basename: AMMONIA,  in the last 168 hours CBC:  Recent Labs Lab 07/27/14 1530 07/28/14 0525 07/29/14 0520 07/29/14 1246 07/30/14 0217 07/31/14 0216  WBC 20.9* 15.9* 13.6*  --  23.9* 25.9*  NEUTROABS 19.0*  --   --   --   --   --   HGB 10.4* 9.3* 8.9* 10.2* 9.6* 8.4*  HCT 32.4* 28.4* 27.0* 30.0* 28.6* 24.8*  MCV 86.2 84.0 84.6  --  83.4 82.4  PLT 307 278 261  --  251 268   Cardiac Enzymes: No results found for this basename: CKTOTAL, CKMB, CKMBINDEX, TROPONINI,  in the last 168 hours BNP: No components found with this basename: POCBNP,  CBG:  Recent Labs Lab 07/30/14 0856 07/30/14 1146 07/30/14 1658 07/30/14 2128 07/31/14 0806  GLUCAP 138* 99 202* 199* 168*   Microbiology: Lab Results  Component Value Date   CULT  Value: NO GROWTH Performed at [facility] 06/03/2014   CULT NO GROWTH 5 DAYS 06/03/2014   CULT NO GROWTH 5 DAYS 06/03/2014   CULT NO GROWTH 3 DAYS 05/14/2012   CULT NO GROWTH 5 DAYS 05/02/2012   No results found for this basename: CULT, SDES,  in the last 168 hours  Imaging: Dg Pelvis Portable  07/29/2014   CLINICAL DATA:  Postop right hip replacement for fracture  EXAM: PORTABLE PELVIS 1-2 VIEWS  COMPARISON:  Pelvic CT 06/03/2014  FINDINGS: There are immediate  postoperative changes of right hip arthroplasty. No hardware complication or periprosthetic fracture is identified. Expected skin staples and small amount of soft tissue gas are seen lateral to the proximal right femur.  IMPRESSION: Right hip arthroplasty.  No acute complicating features.   Electronically Signed   By: Curlene Dolphin M.D.   On: 07/29/2014 18:22   Portable Chest Xray  07/29/2014   CLINICAL DATA:  ET tube placement  EXAM: PORTABLE CHEST - 1 VIEW  COMPARISON:  07/27/2014  FINDINGS: Endotracheal tube with the tip 2.7 cm above the carina. Bilateral interstitial thickening. Trace bilateral pleural effusions. No pneumothorax. Stable mild cardiomegaly. Prior CABG. Unremarkable osseous structures.  IMPRESSION: Endotracheal tube with the tip 2.7 cm above the carina. Findings most concerning for mild pulmonary edema.   Electronically Signed   By: Kathreen Devoid   On: 07/29/2014 18:28     ASSESSMENT:  1. Hip fracture s/p repair 10/8 2. Severe PAH on flolan  3. CAD s/p CABG. LAD lesion on last cath does not appear to be flow limiting  4. Chronic Diastolic HF  5. H/O Of GI bleed from esophagitis  6. Hypokalemia  7. Diarrhea H/O C diff  8. Hypothyroid  9. DM    PLAN/DISCUSSION:  Doing well POD#2.  Back on po lasix. Volume status looks ok. Mild jaw claudication from Flolan. No change at this point. Can go to tele. Continue Flolan. Incentive spirometer. Mobilize.  We will follow   LOS: 4 days    Glori Bickers, MD 07/31/2014, 9:40 AM

## 2014-07-31 NOTE — Progress Notes (Signed)
Subjective: 2 Days Post-Op Procedure(s) (LRB): ARTHROPLASTY BIPOLAR HIP (Right) Patient reports pain as mild.    Objective: Vital signs in last 24 hours: Temp:  [98.3 F (36.8 C)-101 F (38.3 C)] 98.3 F (36.8 C) (10/10 0000) Pulse Rate:  [94-129] 102 (10/10 0700) Resp:  [17-34] 23 (10/10 0700) BP: (92-138)/(34-58) 105/58 mmHg (10/10 0700) SpO2:  [87 %-100 %] 100 % (10/10 0700) FiO2 (%):  [30 %] 30 % (10/09 0900)  Intake/Output from previous day: 10/09 0701 - 10/10 0700 In: 1320.3 [P.O.:1130; I.V.:190.3] Out: 1095 [Urine:1095] Intake/Output this shift:     Recent Labs  07/29/14 0520 07/29/14 1246 07/30/14 0217 07/31/14 0216  HGB 8.9* 10.2* 9.6* 8.4*    Recent Labs  07/30/14 0217 07/31/14 0216  WBC 23.9* 25.9*  RBC 3.43* 3.01*  HCT 28.6* 24.8*  PLT 251 268    Recent Labs  07/30/14 0217 07/31/14 0216  NA 138 134*  K 4.2 3.5*  CL 95* 91*  CO2 29 29  BUN 14 17  CREATININE 1.01 0.98  GLUCOSE 115* 139*  CALCIUM 8.6 8.5   No results found for this basename: LABPT, INR,  in the last 72 hours  Neurologically intact  Assessment/Plan: 2 Days Post-Op Procedure(s) (LRB): ARTHROPLASTY BIPOLAR HIP (Right) Up with therapy  Breathing well.   Meagan [name] C 07/31/2014, 7:56 AM

## 2014-08-01 ENCOUNTER — Inpatient Hospital Stay (HOSPITAL_COMMUNITY): Payer: Medicare Other

## 2014-08-01 LAB — TYPE AND SCREEN
ABO/RH(D): O POS
Antibody Screen: NEGATIVE
UNIT DIVISION: 0
Unit division: 0
Unit division: 0
Unit division: 0

## 2014-08-01 LAB — BASIC METABOLIC PANEL
Anion gap: 14 (ref 5–15)
BUN: 18 mg/dL (ref 6–23)
CALCIUM: 8.4 mg/dL (ref 8.4–10.5)
CO2: 28 mEq/L (ref 19–32)
CREATININE: 0.9 mg/dL (ref 0.50–1.10)
Chloride: 88 mEq/L — ABNORMAL LOW (ref 96–112)
GFR calc Af Amer: 74 mL/min — ABNORMAL LOW (ref >90)
GFR calc non Af Amer: 64 mL/min — ABNORMAL LOW (ref >90)
Glucose, Bld: 165 mg/dL — ABNORMAL HIGH (ref 70–99)
Potassium: 3.6 mEq/L — ABNORMAL LOW (ref 3.7–5.3)
Sodium: 130 mEq/L — ABNORMAL LOW (ref 137–147)

## 2014-08-01 LAB — CLOSTRIDIUM DIFFICILE BY PCR: Toxigenic C. Difficile by PCR: POSITIVE — AB

## 2014-08-01 LAB — CBC
HEMATOCRIT: 21.7 % — AB (ref 36.0–46.0)
Hemoglobin: 7.4 g/dL — ABNORMAL LOW (ref 12.0–15.0)
MCH: 28.4 pg (ref 26.0–34.0)
MCHC: 34.1 g/dL (ref 30.0–36.0)
MCV: 83.1 fL (ref 78.0–100.0)
Platelets: 286 10*3/uL (ref 150–400)
RBC: 2.61 MIL/uL — ABNORMAL LOW (ref 3.87–5.11)
RDW: 13.9 % (ref 11.5–15.5)
WBC: 22.9 10*3/uL — ABNORMAL HIGH (ref 4.0–10.5)

## 2014-08-01 LAB — GLUCOSE, CAPILLARY
GLUCOSE-CAPILLARY: 157 mg/dL — AB (ref 70–99)
Glucose-Capillary: 205 mg/dL — ABNORMAL HIGH (ref 70–99)
Glucose-Capillary: 118 mg/dL — ABNORMAL HIGH (ref 70–99)
Glucose-Capillary: 185 mg/dL — ABNORMAL HIGH (ref 70–99)

## 2014-08-01 MED ORDER — VANCOMYCIN 50 MG/ML ORAL SOLUTION: 125.0000 mg | Freq: Four times a day (QID) | ORAL | Status: DC

## 2014-08-01 MED ORDER — OXYCODONE-ACETAMINOPHEN 5-325 MG PO TABS: 1.0000 | ORAL_TABLET | ORAL | Status: DC | PRN

## 2014-08-01 MED ORDER — VANCOMYCIN 50 MG/ML ORAL SOLUTION
125.0000 mg | Freq: Four times a day (QID) | ORAL | Status: DC
  Filled 2014-08-01: qty 2.5

## 2014-08-01 MED ORDER — VANCOMYCIN 50 MG/ML ORAL SOLUTION
125.0000 mg | Freq: Four times a day (QID) | ORAL | Status: DC
  Administered 2014-08-02 – 2014-08-03 (×7): 125 mg via ORAL
  Filled 2014-08-01 (×10): qty 2.5

## 2014-08-01 NOTE — Plan of Care (Signed)
Problem: Phase III Progression Outcomes Goal: Incision clean - minimal/no drainage Outcome: Progressing Dressing remains clean and dry.  There is no noticeable discharge or bleeding.  Neurovascular checks within normal.

## 2014-08-01 NOTE — Progress Notes (Signed)
Subjective:   POD #3  Denies dyspnea or CP. Remains anxious. Jaw claudication resolved. No fever. Leukocytosis improving   Intake/Output Summary (Last 24 hours) at 08/01/14 0946 Last data filed at 08/01/14 0917  Gross per 24 hour  Intake    480 ml  Output    751 ml  Net   -271 ml    Current meds: . ALPRAZolam  1 mg Oral QHS  . aspirin EC  81 mg Oral Daily  . DULoxetine  30 mg Oral QHS  . DULoxetine  60 mg Oral Daily  . enoxaparin (LOVENOX) injection  40 mg Subcutaneous Q24H  . ezetimibe-simvastatin  1 tablet Oral QHS  . furosemide  80 mg Oral Daily  . HYDROmorphone  4 mg Oral BID  . ice pack  1 each Other 4 times per day  . insulin aspart  0-15 Units Subcutaneous TID WC  . insulin aspart  0-5 Units Subcutaneous QHS  . insulin glargine  5 Units Subcutaneous QHS  . isosorbide mononitrate  90 mg Oral q morning - 10a  . levothyroxine  25 mcg Oral QAC breakfast  . metoprolol succinate  100 mg Oral Daily  . ondansetron  8 mg Oral BID  . pantoprazole  40 mg Oral Q1200  . potassium chloride SA  40 mEq Oral Daily   Infusions: . epoprostenol 33.45 ng/kg/min (07/28/14 1855)     Objective:  Blood pressure 120/42, pulse 101, temperature 98.4 F (36.9 C), temperature source Oral, resp. rate 20, height 5' (1.524 m), weight 66.78 kg (147 lb 3.6 oz), SpO2 100.00%. Weight change:  Physical Exam:  General: Chronically-ill appearing. Lying flat in bed Wearing [place] O2 HEENT: normal  Neck: thick . JVP ~5 Carotids 2+ bilat; no bruits. No lymphadenopathy or thryomegaly appreciated.  Cor: PMI nonpalpable Regular tachy. 2/6 TR. P2 perhaps mildly accentuated but not severely so  Hickman catheter ok  Lungs: clear but decreased throughout no wheezing  Abdomen: obese. soft, nontender, nondistended. No hepatosplenomegaly. No bruits or masses. hypoactivends.  Extremities: no cyanosis, clubbing  Neuro: alert & oriented x 3, cranial nerves grossly intact. moves all 4 extremities w/o difficulty.  Affect pleasant.    Telemetry: SR 100-110  Lab Results: Basic Metabolic Panel:  Recent Labs Lab 07/28/14 0525 07/29/14 0520 07/29/14 1246 07/30/14 0217 07/31/14 0216 08/01/14 0332  NA 140 136* 134* 138 134* 130*  K 3.2* 4.6 4.5 4.2 3.5* 3.6*  CL 97 98  --  95* 91* 88*  CO2 32 29  --  29 29 28   GLUCOSE 44* 84 118* 115* 139* 165*  BUN 10 11  --  14 17 18   CREATININE 0.84 0.94  --  1.01 0.98 0.90  CALCIUM 8.5 8.4  --  8.6 8.5 8.4   Liver Function Tests: No results found for this basename: AST, ALT, ALKPHOS, BILITOT, PROT, ALBUMIN,  in the last 168 hours No results found for this basename: LIPASE, AMYLASE,  in the last 168 hours No results found for this basename: AMMONIA,  in the last 168 hours CBC:  Recent Labs Lab 07/27/14 1530 07/28/14 0525 07/29/14 0520 07/29/14 1246 07/30/14 0217 07/31/14 0216 08/01/14 0332  WBC 20.9* 15.9* 13.6*  --  23.9* 25.9* 22.9*  NEUTROABS 19.0*  --   --   --   --   --   --   HGB 10.4* 9.3* 8.9* 10.2* 9.6* 8.4* 7.4*  HCT 32.4* 28.4* 27.0* 30.0* 28.6* 24.8* 21.7*  MCV 86.2 84.0 84.6  --  83.4 82.4 83.1  PLT 307 278 261  --  251 268 286   Cardiac Enzymes: No results found for this basename: CKTOTAL, CKMB, CKMBINDEX, TROPONINI,  in the last 168 hours BNP: No components found with this basename: POCBNP,  CBG:  Recent Labs Lab 07/31/14 0806 07/31/14 1215 07/31/14 1632 07/31/14 2122 08/01/14 0537  GLUCAP 168* 179* 136* 149* 157*   Microbiology: Lab Results  Component Value Date   CULT  Value: NO GROWTH Performed at [facility] 06/03/2014   CULT NO GROWTH 5 DAYS 06/03/2014   CULT NO GROWTH 5 DAYS 06/03/2014   CULT NO GROWTH 3 DAYS 05/14/2012   CULT NO GROWTH 5 DAYS 05/02/2012   No results found for this basename: CULT, SDES,  in the last 168 hours  Imaging: No results found.   ASSESSMENT:  1. Hip fracture s/p repair 10/8 2. Severe PAH on flolan  3. CAD s/p CABG. LAD lesion on last cath does not appear to be flow  limiting  4. Chronic Diastolic HF  5. H/O Of GI bleed from esophagitis  6. Hypokalemia  7. Diarrhea H/O C diff  8. Hypothyroid  9. DM    PLAN/DISCUSSION:  Stable POD#3.  Back on po lasix. Incentive spirometer. Mobilize. WBC trending down. No localizing infectious sx. Will check pCXR.  Hgb trending down. May need transfusion if it drops any lower. We will follow    LOS: 5 days    Glori Bickers, MD 08/01/2014, 9:46 AM

## 2014-08-01 NOTE — Progress Notes (Signed)
PROGRESS NOTE  Meagan [name] MHD:[id number] DOB: May 17, 1946 DOA: 07/27/2014 PCP: Glo Herring., MD  Meagan [name] is a 68 y.o. female with past medical history of CHF, severe PAH on continuous Flolan infusion, recent C. difficile colitis, history of MI and IDDM. She was brought to the hospital after she fell and broke her right hip. Patient said this morning she was getting ready to see her urologist, she got out of the bathroom and she fell.  She experienced severe right hip pain, and couldn't get up.  She was brought to the hospital for further evaluation. Patient denies any shortness of breath more than usual.  Per her husband she's been unsteady since discharge from the hospital recently for BRBPR and C. difficile colitis.  In the ED x-ray of the right hip showed right femoral neck fracture with displacement, orthopedics Dr. Erlinda Hong consulted.   Assessment/Plan: Closed right hip fracture.   -s/p R hip arthroplasty -was in ICU post op, Tx to floor since extubated  -pain control -Physical therapy -Lovenox for DVt proph, high risk for PE given post op and Severe PAH -will need SNF but wants to go home, CIR consulted  Severe Pulmonary Hypertension -Patient is on continuous Flolan infusion, patient follows with Dr. Gilles Chiquito in [place].  -Appears to be controlled, last 2-D echo done in August showed normal systolic pressure on FLolan  Diabetes mellitus, insulin-dependent  -continue Lantus at 5 units, SSI -hemoglobin A1c 6.3  Leukocytosis -afebrile -no cough/ congestion, no diarrhea -some diarrhea, check Cdiff PCR -monitor for now  Recent history of C. difficile diarrhea  -Avoid unnecessary antibiotics and PPIs.   Chronic diastolic CHF  -LVF from echo done on 06/05/2014 showed LVEF of 68% and grade 1 diastolic dysfunction.  -Patient appears to be euvolemic.  -Has chronic baseline of shortness of breath likely secondary to chronic diastolic CHF and severe PAH. -Continue lasix,  imdur, metoprolol  Normocytic anemia -With some drop post op, hb down further -Monitor, transfuse if <7  Chronic respiratory failure on home oxygen/severe pulmonary hypertension/restrictive lung disease - Continue 6L Home oxygen and outpatient followup with [facility].  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 Has had urology evaluation during that admission, apparently has had outpatient urology followup as well.  DVT Prophylaxis:  lovenox  Code Status: full Family Communication: husband at bedside. Disposition Plan: will need SNF at discharge but wants to go home, CIR consulted   Consultants:  Orthopedic surgery  Cardiology   CHF  Procedures:    Antibiotics: Anti-infectives   Start     Dose/Rate Route Frequency Ordered Stop   07/29/14 1800  ceFAZolin (ANCEF) IVPB 2 g/50 mL premix     2 g 100 mL/hr over 30 Minutes Intravenous Every 6 hours 07/29/14 1625 07/30/14 0004        HPI/Subjective: Patient reports pain is controlled, no new complaints, some loose stool this am  Objective: Filed Vitals:   08/01/14 0011 08/01/14 0145 08/01/14 0502 08/01/14 1324  BP: 123/49 130/51 120/42 101/41  Pulse: 96 99 101 94  Temp: 98.5 F (36.9 C) 98.6 F (37 C) 98.4 F (36.9 C) 98.5 F (36.9 C)  TempSrc: Oral Oral Oral Oral  Resp: 22 20 20 20   Height:      Weight:   66.78 kg (147 lb 3.6 oz)   SpO2: 100% 100% 100% 100%    Intake/Output Summary (Last 24 hours) at 08/01/14 1551 Last  data filed at 08/01/14 1522  Gross per 24 hour  Intake    240 ml  Output    852 ml  Net   -612 ml   Filed Weights   07/29/14 1608 07/31/14 1547 08/01/14 0502  Weight: 68.8 kg (151 lb 10.8 oz) 67.4 kg (148 lb 9.4 oz) 66.78 kg (147 lb 3.6 oz)    Exam: General: WD, elderly female, chronically ill appearing  HEENT:  EOMI, Anicteic Sclera, MMM. No pharyngeal erythema or exudates  Neck: Supple, no JVD, no masses    Cardiovascular: RRR, S1 S2 auscultated, no rubs, or gallops.   Respiratory: Clear to auscultation bilaterally with equal chest rise.  On oxygen Abdomen: Obese, Soft, nontender, nondistended, + bowel sounds  Extremities:  R hip with dressing C/d/i Neuro: AAOx3, cranial nerves grossly intact.  Skin: Without rashes exudates or nodules.     Data Reviewed: Basic Metabolic Panel:  Recent Labs Lab 07/28/14 0525 07/29/14 0520 07/29/14 1246 07/30/14 0217 07/31/14 0216 08/01/14 0332  NA 140 136* 134* 138 134* 130*  K 3.2* 4.6 4.5 4.2 3.5* 3.6*  CL 97 98  --  95* 91* 88*  CO2 32 29  --  29 29 28   GLUCOSE 44* 84 118* 115* 139* 165*  BUN 10 11  --  14 17 18   CREATININE 0.84 0.94  --  1.01 0.98 0.90  CALCIUM 8.5 8.4  --  8.6 8.5 8.4   CBC:  Recent Labs Lab 07/27/14 1530 07/28/14 0525 07/29/14 0520 07/29/14 1246 07/30/14 0217 07/31/14 0216 08/01/14 0332  WBC 20.9* 15.9* 13.6*  --  23.9* 25.9* 22.9*  NEUTROABS 19.0*  --   --   --   --   --   --   HGB 10.4* 9.3* 8.9* 10.2* 9.6* 8.4* 7.4*  HCT 32.4* 28.4* 27.0* 30.0* 28.6* 24.8* 21.7*  MCV 86.2 84.0 84.6  --  83.4 82.4 83.1  PLT 307 278 261  --  251 268 286   BNP (last 3 results)  Recent Labs  02/23/14 1006  PROBNP 803.2*   CBG:  Recent Labs Lab 07/31/14 1215 07/31/14 1632 07/31/14 2122 08/01/14 0537 08/01/14 1144  GLUCAP 179* 136* 149* 157* 205*    Recent Results (from the past 240 hour(s))  SURGICAL PCR SCREEN     Status: Abnormal   Collection Time    07/28/14  2:42 AM      Result Value Ref Range Status   MRSA, PCR NEGATIVE  NEGATIVE Final   Staphylococcus aureus POSITIVE (*) NEGATIVE Final   Comment:            The Xpert SA Assay (FDA     approved for NASAL specimens     in patients over 42 years of age),     is one component of     a comprehensive surveillance     program.  Test performance has     been validated by [employer] for patients greater     than or equal to 47 year old.     It is  not intended     to diagnose infection nor to     guide or monitor treatment.     Studies: No results found.  Scheduled Meds: . ALPRAZolam  1 mg Oral QHS  . aspirin EC  81 mg Oral Daily  . DULoxetine  30 mg Oral QHS  . DULoxetine  60 mg Oral Daily  . enoxaparin (LOVENOX) injection  40  mg Subcutaneous Q24H  . ezetimibe-simvastatin  1 tablet Oral QHS  . furosemide  80 mg Oral Daily  . HYDROmorphone  4 mg Oral BID  . ice pack  1 each Other 4 times per day  . insulin aspart  0-15 Units Subcutaneous TID WC  . insulin aspart  0-5 Units Subcutaneous QHS  . insulin glargine  5 Units Subcutaneous QHS  . isosorbide mononitrate  90 mg Oral q morning - 10a  . levothyroxine  25 mcg Oral QAC breakfast  . metoprolol succinate  100 mg Oral Daily  . ondansetron  8 mg Oral BID  . pantoprazole  40 mg Oral Q1200  . potassium chloride SA  40 mEq Oral Daily   Continuous Infusions: . epoprostenol 33.45 ng/kg/min (07/28/14 1855)    Principal Problem:   Closed right hip fracture Active Problems:   DM (diabetes mellitus), type 2, uncontrolled   PAH (pulmonary artery hypertension)   Acute on chronic diastolic heart failure   Reflux esophagitis   C. difficile diarrhea   CKD (chronic kidney disease), stage III   Pre-operative cardiovascular examination   Right renal mass   Anemia  . If 7PM-7AM, please contact night-coverage at [email or web address], password [email or web address] 08/01/2014, 3:51 PM  LOS: 5 days   Domenic Polite, MD [phone]

## 2014-08-01 NOTE — Progress Notes (Signed)
Subjective: 3 Days Post-Op Procedure(s) (LRB): ARTHROPLASTY BIPOLAR HIP (Right) Patient reports pain as moderate.  Out of ICU.  No therapy yesterday according to family.  Acute blood loss anemia from her fracture and surgery.  Vitals stable thus far.  Objective: Vital signs in last 24 hours: Temp:  [98.2 F (36.8 C)-98.6 F (37 C)] 98.4 F (36.9 C) (10/11 0502) Pulse Rate:  [93-109] 101 (10/11 0502) Resp:  [18-30] 20 (10/11 0502) BP: (106-130)/(38-85) 120/42 mmHg (10/11 0502) SpO2:  [99 %-100 %] 100 % (10/11 0502) Weight:  [66.78 kg (147 lb 3.6 oz)-67.4 kg (148 lb 9.4 oz)] 66.78 kg (147 lb 3.6 oz) (10/11 0502)  Intake/Output from previous day: 10/10 0701 - 10/11 0700 In: 480 [P.O.:480] Out: 751 [Urine:750; Stool:1] Intake/Output this shift:     Recent Labs  07/29/14 1246 07/30/14 0217 07/31/14 0216 08/01/14 0332  HGB 10.2* 9.6* 8.4* 7.4*    Recent Labs  07/31/14 0216 08/01/14 0332  WBC 25.9* 22.9*  RBC 3.01* 2.61*  HCT 24.8* 21.7*  PLT 268 286    Recent Labs  07/31/14 0216 08/01/14 0332  NA 134* 130*  K 3.5* 3.6*  CL 91* 88*  CO2 29 28  BUN 17 18  CREATININE 0.98 0.90  GLUCOSE 139* 165*  CALCIUM 8.5 8.4   No results found for this basename: LABPT, INR,  in the last 72 hours  Sensation intact distally Intact pulses distally Dorsiflexion/Plantar flexion intact Incision: dressing C/D/I  Assessment/Plan: 3 Days Post-Op Procedure(s) (LRB): ARTHROPLASTY BIPOLAR HIP (Right) Up with therapy, WBAT right hip, anterior hip precautions. Transfusions as necessary per primary team. From Ortho standpoint, wound only cover for DVT with BID 325 mg aspirin unless she was on Plavix pre-op and then would just resume this  Wally Shevchenko Y 08/01/2014, 9:03 AM

## 2014-08-02 LAB — BASIC METABOLIC PANEL
Anion gap: 12 (ref 5–15)
BUN: 18 mg/dL (ref 6–23)
CALCIUM: 8.5 mg/dL (ref 8.4–10.5)
CO2: 29 meq/L (ref 19–32)
CREATININE: 0.9 mg/dL (ref 0.50–1.10)
Chloride: 91 mEq/L — ABNORMAL LOW (ref 96–112)
GFR calc Af Amer: 74 mL/min — ABNORMAL LOW (ref >90)
GFR, EST NON AFRICAN AMERICAN: 64 mL/min — AB (ref >90)
GLUCOSE: 146 mg/dL — AB (ref 70–99)
Potassium: 3.9 mEq/L (ref 3.7–5.3)
Sodium: 132 mEq/L — ABNORMAL LOW (ref 137–147)

## 2014-08-02 LAB — GLUCOSE, CAPILLARY
GLUCOSE-CAPILLARY: 214 mg/dL — AB (ref 70–99)
GLUCOSE-CAPILLARY: 189 mg/dL — AB (ref 70–99)
Glucose-Capillary: 181 mg/dL — ABNORMAL HIGH (ref 70–99)
Glucose-Capillary: 135 mg/dL — ABNORMAL HIGH (ref 70–99)

## 2014-08-02 LAB — CBC
HCT: 26.6 % — ABNORMAL LOW (ref 36.0–46.0)
HEMATOCRIT: 21.9 % — AB (ref 36.0–46.0)
HEMOGLOBIN: 7.4 g/dL — AB (ref 12.0–15.0)
Hemoglobin: 8.9 g/dL — ABNORMAL LOW (ref 12.0–15.0)
MCH: 28.1 pg (ref 26.0–34.0)
MCH: 27.9 pg (ref 26.0–34.0)
MCHC: 33.8 g/dL (ref 30.0–36.0)
MCHC: 33.5 g/dL (ref 30.0–36.0)
MCV: 83.3 fL (ref 78.0–100.0)
MCV: 83.4 fL (ref 78.0–100.0)
PLATELETS: 249 10*3/uL (ref 150–400)
Platelets: 264 10*3/uL (ref 150–400)
RBC: 2.63 MIL/uL — AB (ref 3.87–5.11)
RBC: 3.19 MIL/uL — ABNORMAL LOW (ref 3.87–5.11)
RDW: 13.8 % (ref 11.5–15.5)
RDW: 13.3 % (ref 11.5–15.5)
WBC: 19.1 10*3/uL — ABNORMAL HIGH (ref 4.0–10.5)
WBC: 20.7 10*3/uL — ABNORMAL HIGH (ref 4.0–10.5)

## 2014-08-02 LAB — PROTIME-INR
INR: 1.03 (ref 0.00–1.49)
PROTHROMBIN TIME: 13.5 s (ref 11.6–15.2)

## 2014-08-02 LAB — APTT: aPTT: 29 seconds (ref 24–37)

## 2014-08-02 LAB — PREPARE RBC (CROSSMATCH)

## 2014-08-02 MED ORDER — INSULIN GLARGINE 100 UNIT/ML ~~LOC~~ SOLN
8.0000 [IU] | Freq: Every day | SUBCUTANEOUS | Status: DC
  Administered 2014-08-02: 8 [IU] via SUBCUTANEOUS
  Filled 2014-08-02 (×2): qty 0.08

## 2014-08-02 MED ORDER — SODIUM CHLORIDE 0.9 % IV SOLN
Freq: Once | INTRAVENOUS | Status: AC
  Administered 2014-08-02: 13:00:00 via INTRAVENOUS

## 2014-08-02 MED ORDER — HYDROMORPHONE HCL 1 MG/ML IJ SOLN
1.0000 mg | INTRAMUSCULAR | Status: DC | PRN
  Administered 2014-08-02 – 2014-08-03 (×4): 1 mg via INTRAVENOUS
  Filled 2014-08-02 (×5): qty 1

## 2014-08-02 MED ORDER — FUROSEMIDE 10 MG/ML IJ SOLN
20.0000 mg | Freq: Once | INTRAMUSCULAR | Status: AC
  Administered 2014-08-02: 20 mg via INTRAVENOUS

## 2014-08-02 NOTE — Progress Notes (Signed)
Patient ID: Meagan [name], female   DOB: Feb 22, 1946, 68 y.o.   MRN: [id number] PTT and INR normal. Hgb 27%  Awake and alert, very hard of hearing ? Aminoglycoside Dressing has been changed by nursing, incision shows no active bleeding, some scant blood proximal incision site. Needs to be in PAS for effective anti DVT prophylaxis especially as lovenox is stopped.

## 2014-08-02 NOTE — Progress Notes (Signed)
Occupational Therapy Treatment Patient Details Name: Meagan [name] MRN: [id number] DOB: [birth year] Today's Date: 08/02/2014    History of present illness 68 yo female with hx CHF, severe PAH on flolan, recent C Diff colitis admitted 10/6 after a fall with displaced R hip fx.  Underwent Rt THA on 10/8.  Post op failed extubation in PACU requiring reintubation. Extubated 10/9. Pt on continuous pump for pulmonary artery hypertension.   OT comments  Pt seen today for independence with ADLs. Observed pt during PT session and use of Stedy (prior to OT session) and pt with improved mobility using Stedy. Following PT session, pt participated in grooming activities and UE exercises. Pt would benefit from increased mobility using Stedy to perform ADLs at sink. Pt continues to be a good candidate for CIR and will benefit from acute OT to address LB ADLs and functional transfers.    Follow Up Recommendations  CIR;Supervision/Assistance - 24 hour    Equipment Recommendations  None recommended by OT    Recommendations for Other Services      Precautions / Restrictions Precautions Precautions: Anterior Hip Precaution Booklet Issued: No Restrictions Weight Bearing Restrictions: No RLE Weight Bearing: Weight bearing as tolerated       Mobility Bed Mobility     General bed mobility comments: Pt up in chair following PT session.   Transfers       General transfer comment: Observed pt with PT. Pt with improved mobility using Stedy and would benefit from further use with therapists and RN.     Balance Overall balance assessment: Needs assistance Sitting-balance support: Bilateral upper extremity supported;Feet supported Sitting balance-Leahy Scale: Poor Sitting balance - Comments: Posterior and left lean requiring assist even with BUE support.                     ADL Overall ADL's : Needs assistance/impaired     Grooming: Set up;Supervision/safety;Sitting Grooming Details  (indicate cue type and reason): Next session will focus on using Steady with pt at sink for grooming activities to address standing tolerance.                                General ADL Comments: OT observed pt with PT. Pt with improved function with use of steady and would benefit from it's use during ADLs at the sink and for transfers to [facility] (rather than bed pan). Discussed this with pt and husband and will notify RN. Following PT session, pt participated in UE strengthening exercises in sitting.                 Cognition  Arousal/Alertness: Awake/Alert Behavior During Therapy: Flat affect Overall Cognitive Status: Impaired/Different from baseline Area of Impairment: Memory;Safety/judgement;Awareness;Problem solving;Attention   Current Attention Level: Selective Memory: Decreased short-term memory;Decreased recall of precautions    Safety/Judgement: Decreased awareness of safety;Decreased awareness of deficits Awareness: Intellectual Problem Solving: Slow processing;Difficulty sequencing General Comments: Pt appears to have impaired cognition and unknown whether this is baseline deficit or acute.  Of note: Pt reports "something happened to my hearing during my last hospitalization and now I can't hear good."      Exercises General Exercises - Upper Extremity Shoulder Flexion: AROM;Both;10 reps;Seated Shoulder ABduction: AROM;Both;10 reps;Seated Chair Push Up: AROM;Both;10 reps (hold for 3 second count) Other Exercises Other Exercises: Pt performed rotations of scapular elevation, depression, retraction with 3 second hold. Pt performed x 3 rotations. Encouraged pt  to perform regularly to reduce stiffness from being in bed.            Pertinent Vitals/ Pain       Pain Assessment: Faces Faces Pain Scale: Hurts little more Pain Location: Rt hip Pain Descriptors / Indicators: Aching Pain Intervention(s): Limited activity within patient's tolerance;Monitored during  session;Repositioned         Frequency Min 2X/week     Progress Toward Goals  OT Goals(current goals can now be found in the care plan section)  Progress towards OT goals: Progressing toward goals  ADL Goals Pt Will Perform Lower Body Bathing: with caregiver independent in assisting;with adaptive equipment;sit to/from stand;with mod assist Pt Will Perform Lower Body Dressing: with adaptive equipment;with caregiver independent in assisting;sit to/from stand;with mod assist Pt Will Transfer to Toilet: with mod assist;bedside commode;ambulating Pt Will Perform Toileting - Clothing Manipulation and hygiene: with min assist;sit to/from stand;sitting/lateral leans Additional ADL Goal #1: Complete bed mobility with mod  A in preparation for ADL  Plan Discharge plan remains appropriate       End of Session Equipment Utilized During Treatment: Oxygen   Activity Tolerance Patient tolerated treatment well   Patient Left in chair;with call bell/phone within reach;with family/visitor present   Nurse Communication Other (comment) (use Stedy to assist pt to [facility] (not bed pan))        Time: 1130-1155 OT Time Calculation (min): 25 min  Charges: OT General Charges $OT Visit: 1 Procedure OT Treatments $Self Care/Home Management : 8-22 mins $Therapeutic Exercise: 8-22 mins  Villa Herb M 08/02/2014, 2:24 PM  Cyndie Chime, OTR/L Occupational Therapist [phone] (pager)

## 2014-08-02 NOTE — Progress Notes (Signed)
Pt noted to have minimal amount of bloody drainage from incision site on r hip. Surgical bandage was saturated. Removed bandage to change dressing and noted large blood clot. Placed clean dressing on incision site and paged orthopedic on call surgeon. Also notified hospitalist. Orthopedic surgeon will come to bedside to assess. Will  Continue to monitor pt closely.

## 2014-08-02 NOTE — Progress Notes (Signed)
Rehab admissions - I met with pt's husband in hall as pt was working with therapy. Renee, case Physiological scientist with ortho group was also present for this discussion.   [name] states they are interested in pursuing inpatient rehab and that pt is really wanting to go home when able. "We want to go home tomorrow but realize she needs more therapy."   I gave informational brochures about our inpatient rehab unit and initial questions were answered. I explained that we would consider possible inpatient rehab admit pending her medical clearance and our bed availability. Renee was in agreement for this plan as well.  I will check on pt's status tomorrow and proceed accordingly. Please call me with any questions. Thanks.  Nanetta Batty, PT Rehabilitation Admissions Coordinator [phone]

## 2014-08-02 NOTE — Consult Note (Signed)
Physical Medicine and Rehabilitation Consult Reason for Consult: Right femoral neck fracture Referring Physician: [facility]   HPI: ALIYA [name] is a 68 y.o. right-handed female with history of systolic congestive heart failure, severe PAH(pulmonary arterial hypertension) maintained on continuous flofan infusion, fibromyalgia, COPD with chronic oxygen, CAD with CABG and diabetes mellitus with peripheral neuropathy. Independent prior to admission living with her husband and used a walker outside of the home. Presented 07/27/2014 after mechanical fall. Denies loss of consciousness or associated chest pain or shortness or breath associated with fall. X-rays and imaging revealed a right hip displaced and angulated femoral neck fracture. Cardiology followup preoperatively and cleared for surgery. Underwent 8 total hip arthroplasty 07/29/2014 per Dr. Ninfa Linden. Weightbearing as tolerated with anterior hip cautions. Hospital course pain management. Subcutaneous Lovenox for DVT prophylaxis. Acute blood loss anemia 7.4 and monitored. Patient with recent history of Clostridium difficile maintained on contact precautions. Physical therapy evaluation completed with recommendations for physical medicine rehabilitation consult.   Review of Systems  Respiratory: Positive for shortness of breath.   Cardiovascular: Positive for leg swelling.  Gastrointestinal:       GERD  Musculoskeletal: Positive for joint pain and myalgias.  Psychiatric/Behavioral:       Anxiety  All other systems reviewed and are negative.  Past Medical History  Diagnosis Date  . Fibromyalgia   . Hypercholesterolemia   . Hypertension     pulmonary  pap 110/31 (mean 62)by [facility] 9/10 negative PE by chest CT 2010  . Pulmonary edema     Failed Revatio   . ARF (acute renal failure)     poor candidate for ACE -I or ARB  . Restrictive lung disease     obesity hypoventilation syndrome  . Renal insufficiency   . CAD (coronary artery  disease)     multivessel DES x 2 RCA 2007  . Chronic diastolic heart failure   . MRSA (methicillin resistant staphylococcus aureus) pneumonia   . Pulmonary hypertension   . Chronic anemia     anemia of chronic disease based on anemia panel 2011  . Pancreatic mass 08/01/2012  . PONV (postoperative nausea and vomiting)   . Heart murmur     "when I was small"  . CHF (congestive heart failure)   . Anginal pain   . Pneumonia X 3  . Type II diabetes mellitus   . Hypothyroidism   . H/O hiatal hernia   . GERD (gastroesophageal reflux disease)   . Anxiety   . On home oxygen therapy     "6L all the time" (02/24/2014   Past Surgical History  Procedure Laterality Date  . Coronary artery bypass graft  1997    LIMA to LAD,SVG to OM  . Esophagogastroduodenoscopy  06/28/10    schatzki ring otherwise normal;small hiatal hernia  . Coronary angioplasty  1993  . Coronary angioplasty with stent placement  ~ 1996; 2008    "think I have 3 stents"  . Abdominal hysterectomy  ~ 1986  . Dilation and curettage of uterus  ~ 1984  . Tubal ligation    . Esophagogastroduodenoscopy N/A 06/06/2014    Procedure: ESOPHAGOGASTRODUODENOSCOPY (EGD);  Surgeon: Lafayette Dragon, MD;  Location[facility];  Service: Endoscopy;  Laterality: N/A;  . Hip arthroplasty Right 07/29/2014    Procedure: ARTHROPLASTY BIPOLAR HIP;  Surgeon: Mcarthur Rossetti, MD;  Location: [facility];  Service: Orthopedics;  Laterality: Right;   Family History  Problem Relation Age of Onset  .  Cancer Mother     oral  . Heart attack Father   . COPD Brother   . Heart disease Brother    Social History:  reports that she has never smoked. She has never used smokeless tobacco. She reports that she does not drink alcohol or use illicit drugs. Allergies:  Allergies  Allergen Reactions  . Aspirin     REACTION: Intolerance  . Codeine Other (See Comments)    Messes with digestive system  . Niacin Other (See Comments)    Pins sticking into skin    . Nsaids Other (See Comments)    REACTION: Aggravates Digestive System   . Phenergan Fortis [Promethazine] Other (See Comments)    Family and pt states becomes very confused and wild.   . Sulfonamide Derivatives Other (See Comments)    Messes with digestive system   Medications Prior to Admission  Medication Sig Dispense Refill  . ALPRAZolam (XANAX) 0.5 MG tablet Take 1 mg by mouth at bedtime. For sleep. *Prescribed one tablet three times daily as needed*      . aspirin EC 81 MG EC tablet Take 1 tablet (81 mg total) by mouth daily.  30 tablet  0  . DULoxetine (CYMBALTA) 30 MG capsule Take 30 mg by mouth at bedtime.       . DULoxetine (CYMBALTA) 60 MG capsule Take 60 mg by mouth every morning.       Marland Kitchen Epoprostenol Sodium (FLOLAN IV) Inject into the vein. 1.5 mg. Inject into the vein continuously. Infuse mg/kg/min via cont IV pump.      Marland Kitchen ezetimibe-simvastatin (VYTORIN) 10-40 MG per tablet Take 1 tablet by mouth every evening.      . furosemide (LASIX) 40 MG tablet Take 80 mg by mouth daily.       Marland Kitchen HYDROmorphone (DILAUDID) 2 MG tablet Take 4 mg by mouth 2 (two) times daily.       . insulin glargine (LANTUS) 100 UNIT/ML injection Inject 0.12 mLs (12 Units total) into the skin every morning.      . isosorbide mononitrate (IMDUR) 60 MG 24 hr tablet Take 90 mg by mouth every morning.       Marland Kitchen levothyroxine (SYNTHROID, LEVOTHROID) 25 MCG tablet Take 25 mcg by mouth every morning.       . metoprolol (TOPROL-XL) 100 MG 24 hr tablet Take 100 mg by mouth every morning.       Marland Kitchen omeprazole (PRILOSEC) 20 MG capsule Take 20 mg by mouth 2 (two) times daily.        . ondansetron (ZOFRAN) 8 MG tablet Take 8 mg by mouth 2 (two) times daily. For nausea      . Oxygen Permeable Lens Products SOLN Inhale into the lungs continuous. 6 liters      . Oxymetazoline HCl (NASAL SPRAY) 0.05 % SOLN Place 1 spray into the nose as needed (for congestion).      . potassium chloride SA (K-DUR,KLOR-CON) 20 MEQ tablet Take 40  mEq by mouth daily.       . Probiotic Product (FLORAJEN3 PO) Take 1 tablet by mouth daily.      . traMADol (ULTRAM) 50 MG tablet Take 50 mg by mouth every 3 (three) hours as needed. For pain. *takes with Dilaudid (Hydromorphone)      . VYTORIN 10-40 MG per tablet TAKE ONE TABLET BY MOUTH AT BEDTIME  90 tablet  3  . nitroGLYCERIN (NITROSTAT) 0.4 MG SL tablet Place 0.4 mg under the tongue every 5 (  five) minutes as needed for chest pain.         Home: Home Living Family/patient expects to be discharged to:: Private residence Living Arrangements: Spouse/significant other Available Help at Discharge: Family;Available 24 hours/day Type of Home: House Home Access: Stairs to enter [employer] of Steps: 2 Entrance Stairs-Rails: None Home Layout: One level Home Equipment: Walker - 4 wheels;Cane - single point;Shower seat - built in;Bedside commode;Electric scooter;Grab bars - tub/shower  Functional History: Prior Function Level of Independence: Independent Comments: Husband assists with bathing due to pump for pulmonary hypertension. Pt has not driven in a year. Can go out in the community when she feels good. On bad days she stays at home in bed mostly.  Functional Status:  Mobility: Bed Mobility Overal bed mobility: +2 for physical assistance;Needs Assistance Bed Mobility: Supine to Sit;Sit to Supine Supine to sit: Total assist;+2 for physical assistance Sit to supine: Max assist;+2 for physical assistance General bed mobility comments: Pt able to assist with BUT getting into bed Transfers Overall transfer level: Needs assistance Equipment used: 2 person hand held assist Transfers: Sit to/from [employer] Transfers Sit to Stand: Total assist;+2 physical assistance Stand pivot transfers: Total assist;+2 physical assistance General transfer comment: BSC trasnfers/ moved recliner and placed BSC under pt      ADL: ADL Overall ADL's : Needs assistance/impaired Grooming:  Set up;Supervision/safety;Sitting Upper Body Bathing: Moderate assistance;Sitting Lower Body Bathing: Total assistance;Sit to/from stand Upper Body Dressing : Moderate assistance;Sitting Lower Body Dressing: Total assistance;Sit to/from stand Toilet Transfer: +2 for physical assistance;Total assistance;BSC Toileting- Clothing Manipulation and Hygiene: +2 for physical assistance;Sit to/from stand (+3 required to clean bottom in standing.) Functional mobility during ADLs: +2 for physical assistance;Total assistance;Rolling walker;Cueing for sequencing;Cueing for safety General ADL Comments: Assisted nursing with toilet transfer and hygiene after toileitng , requiring +2 skilled assistance using gait belt and bariatric transfer technique.  Educating nursing onsafest way for mobility  Cognition: Cognition Overall Cognitive Status: Impaired/Different from baseline Orientation Level: Oriented X4 Cognition Arousal/Alertness: Awake/alert Behavior During Therapy: Anxious Overall Cognitive Status: Impaired/Different from baseline Area of Impairment: Attention;Memory;Safety/judgement;Awareness;Problem solving Memory: Decreased short-term memory Safety/Judgement: Decreased awareness of safety;Decreased awareness of deficits Awareness: Intellectual Problem Solving: Slow processing;Difficulty sequencing General Comments: Question if pt has some baseline cogntivie deficits. apparently imipaired.   Blood pressure 106/33, pulse 99, temperature 98.2 F (36.8 C), temperature source Oral, resp. rate 17, height 5' (1.524 m), weight 67 kg (147 lb 11.3 oz), SpO2 100.00%. Physical Exam  Constitutional: She is oriented to person, place, and time. She appears well-developed.  HENT:  Head: Normocephalic.  Eyes: EOM are normal.  Neck: Normal range of motion. Neck supple. No thyromegaly present.  Cardiovascular: Normal rate and regular rhythm.   Respiratory: Breath sounds normal. No respiratory distress.  GI:  Soft. Bowel sounds are normal. She exhibits no distension.  Neurological: She is alert and oriented to person, place, and time.  Follows three-step commands. HOH.   Skin:  Hip incision clean and dry appropriately tender  Psychiatric:  A little flat, anxious    Results for orders placed during the hospital encounter of 07/27/14 (from the past 24 hour(s))  CLOSTRIDIUM DIFFICILE BY PCR     Status: Abnormal   Collection Time    08/01/14 11:41 AM      Result Value Ref Range   C difficile by pcr POSITIVE (*) NEGATIVE  GLUCOSE, CAPILLARY     Status: Abnormal   Collection Time    08/01/14 11:44 AM  Result Value Ref Range   Glucose-Capillary 205 (*) 70 - 99 mg/dL   Comment 1 Notify RN    GLUCOSE, CAPILLARY     Status: Abnormal   Collection Time    08/01/14  4:20 PM      Result Value Ref Range   Glucose-Capillary 118 (*) 70 - 99 mg/dL   Comment 1 Notify RN    GLUCOSE, CAPILLARY     Status: Abnormal   Collection Time    08/01/14  9:11 PM      Result Value Ref Range   Glucose-Capillary 185 (*) 70 - 99 mg/dL  CBC     Status: Abnormal   Collection Time    08/02/14  4:12 AM      Result Value Ref Range   WBC 19.1 (*) 4.0 - 10.5 K/uL   RBC 2.63 (*) 3.87 - 5.11 MIL/uL   Hemoglobin 7.4 (*) 12.0 - 15.0 g/dL   HCT 21.9 (*) 36.0 - 46.0 %   MCV 83.3  78.0 - 100.0 fL   MCH 28.1  26.0 - 34.0 pg   MCHC 33.8  30.0 - 36.0 g/dL   RDW 13.8  11.5 - 15.5 %   Platelets 264  150 - 400 K/uL  BASIC METABOLIC PANEL     Status: Abnormal   Collection Time    08/02/14  4:12 AM      Result Value Ref Range   Sodium 132 (*) 137 - 147 mEq/L   Potassium 3.9  3.7 - 5.3 mEq/L   Chloride 91 (*) 96 - 112 mEq/L   CO2 29  19 - 32 mEq/L   Glucose, Bld 146 (*) 70 - 99 mg/dL   BUN 18  6 - 23 mg/dL   Creatinine, Ser 0.90  0.50 - 1.10 mg/dL   Calcium 8.5  8.4 - 10.5 mg/dL   GFR calc non Af Amer 64 (*) >90 mL/min   GFR calc Af Amer 74 (*) >90 mL/min   Anion gap 12  5 - 15   Dg Chest Port 1v Same  Day  08/01/2014   CLINICAL DATA:  Four days post right hip arthroplasty. Leukocytosis. Prior CABG. Acute respiratory failure this admission, now extubated.  EXAM: PORTABLE CHEST - 1 VIEW SAME DAY  COMPARISON:  Portable chest x-rays 07/29/2014, 07/27/2014.  FINDINGS: Extubation. Sternotomy for CABG. Cardiac silhouette moderately enlarged. Right coronary stem. Pulmonary venous hypertension without overt edema. Interval resolution of the patchy airspace opacities in the right upper lobe. New linear atelectasis scattered throughout both lungs. More focal consolidation in the retrocardiac left lower lobe. Possible small bilateral pleural effusions. Right jugular Hickman catheter tip projects over the upper right atrium.  IMPRESSION: 1. Resolution of right upper lobe pneumonia since the examination 3 days ago. 2. New streaky atelectasis or bronchopneumonia in the left lower lobe. 3. Linear atelectasis in both upper lobes.   Electronically Signed   By: Evangeline Dakin M.D.   On: 08/01/2014 16:21    Assessment/Plan: Diagnosis: right femoral neck fracture 1. Does the need for close, 24 hr/day medical supervision in concert with the patient's rehab needs make it unreasonable for this patient to be served in a less intensive setting? Yes 2. Co-Morbidities requiring supervision/potential complications: dm, PAH, recent c diff, ckd 3. Due to bladder management, bowel management, safety, skin/wound care, disease management, medication administration, pain management and patient education, does the patient require 24 hr/day rehab nursing? Yes 4. Does the patient require coordinated care of a physician, rehab  nurse, PT (1-2 hrs/day, 5 days/week) and OT (1-2 hrs/day, 5 days/week) to address physical and functional deficits in the context of the above medical diagnosis(es)? Yes Addressing deficits in the following areas: balance, endurance, locomotion, strength, transferring, bowel/bladder control, bathing, dressing,  feeding, grooming, toileting and psychosocial support 5. Can the patient actively participate in an intensive therapy program of at least 3 hrs of therapy per day at least 5 days per week? Yes 6. The potential for patient to make measurable gains while on inpatient rehab is excellent 7. Anticipated functional outcomes upon discharge from inpatient rehab are supervision and min assist  with PT, supervision and min assist with OT, n/a with SLP. 8. Estimated rehab length of stay to reach the above functional goals is: 10-14 days 9. Does the patient have adequate social supports to accommodate these discharge functional goals? Yes 10. Anticipated D/C setting: Home 11. Anticipated post D/C treatments: HH therapy and Outpatient therapy 12. Overall Rehab/Functional Prognosis: excellent  RECOMMENDATIONS: This patient's condition is appropriate for continued rehabilitative care in the following setting: CIR Patient has agreed to participate in recommended program. Potentially Note that insurance prior authorization may be required for reimbursement for recommended care.  Comment: Rehab Admissions Coordinator to follow up.  Thanks,  Meredith Staggers, MD, Mellody Drown     08/02/2014

## 2014-08-02 NOTE — Progress Notes (Signed)
Blood transfusion complete. 1 unit of PRBCs infused. No signs of a reaction. Will continue to monitor pt closely.  Eulis Canner, RN

## 2014-08-02 NOTE — Progress Notes (Signed)
Physical Therapy Treatment Patient Details Name: Meagan [name] MRN: [id number] DOB: 1946/08/18 Today's Date: 08/02/2014    History of Present Illness 68 yo female with hx CHF, severe PAH on flolan, recent C Diff colitis admitted 10/6 after a fall with displaced R hip fx.  Underwent Rt THA on 10/8.  Post op failed extubation in PACU requiring reintubation. Extubated 10/9. Pt on continuous pump for pulmonary artery hypertension.    PT Comments    Pt making slow, steady progress.  Follow Up Recommendations  CIR     Equipment Recommendations  Other (comment) (to be determined)    Recommendations for Other Services       Precautions / Restrictions Precautions Precautions: Anterior Hip Restrictions RLE Weight Bearing: Weight bearing as tolerated    Mobility  Bed Mobility Overal bed mobility: Needs Assistance Bed Mobility: Supine to Sit     Supine to sit: +2 for physical assistance;Max assist     General bed mobility comments: Assist to bring legs over, elevate trunk into sitting and bringing hips to EOB.  Transfers Overall transfer level: Needs assistance Equipment used: Rolling walker (2 wheeled);Ambulation equipment used Charlaine Dalton) Transfers: Sit to/from [employer] Sit to Stand: +2 physical assistance;Mod assist Stand pivot transfers: +2 physical assistance;Mod assist       General transfer comment: Used Stedy to stand from bed and perform pivot to chair. From chair stood with walker to amb. Verbal cues for hand placement and assist to bring hips up.  Ambulation/Gait Ambulation/Gait assistance: Mod assist;+2 physical assistance Ambulation Distance (Feet): 3 Feet Assistive device: Rolling walker (2 wheeled) Gait Pattern/deviations: Step-to pattern;Decreased step length - left;Decreased stance time - right;Trunk flexed;Antalgic   Gait velocity interpretation: Below normal speed for age/gender General Gait Details: Pt with difficulty stepping with  LLE due to inability to bear weight on RLE. Verbal cues to put weight through arms onto walker.   Stairs            Wheelchair Mobility    Modified Rankin (Stroke Patients Only)       Balance Overall balance assessment: Needs assistance Sitting-balance support: Bilateral upper extremity supported;Feet supported Sitting balance-Leahy Scale: Poor Sitting balance - Comments: Posterior and left lean requiring assist even with BUE support.   Standing balance support: Bilateral upper extremity supported Standing balance-Leahy Scale: Poor Standing balance comment: Walker and 2 person assist for static standing.                    Cognition Arousal/Alertness: Awake/alert Behavior During Therapy: Flat affect         Memory: Decreased short-term memory       Problem Solving: Slow processing;Difficulty sequencing General Comments: Unsure of pt's baseline cognition.    Exercises      General Comments        Pertinent Vitals/Pain Faces Pain Scale: Hurts little more Pain Location: rt hip Pain Intervention(s): Limited activity within patient's tolerance;Repositioned;Monitored during session    Home Living                      Prior Function            PT Goals (current goals can now be found in the care plan section) Progress towards PT goals: Progressing toward goals    Frequency  Min 3X/week    PT Plan Current plan remains appropriate    Co-evaluation             End of Session  Equipment Utilized During Treatment: Gait belt;Oxygen Activity Tolerance: Patient limited by fatigue Patient left: in chair;with call bell/phone within reach;with family/visitor present     Time: 1115-1130 PT Time Calculation (min): 15 min  Charges:  $Gait Training: 8-22 mins                    G Codes:      Dillian Feig 08-23-2014, 1:33 PM  [employer] PT [phone]

## 2014-08-02 NOTE — Progress Notes (Addendum)
PROGRESS NOTE  Meagan [name] ZRA:[id number] DOB: February 07, 1946 DOA: 07/27/2014 PCP: Glo Herring., MD  Meagan [name] is a 68 y.o. female with past medical history of CHF, severe PAH on continuous Flolan infusion, recent C. difficile colitis, history of MI and IDDM. She was brought to the hospital after she fell and broke her right hip. Patient said this morning she was getting ready to see her urologist, she got out of the bathroom and she fell.  She experienced severe right hip pain, and couldn't get up.  She was brought to the hospital for further evaluation. Patient denies any shortness of breath more than usual.  Per her husband she's been unsteady since discharge from the hospital recently for BRBPR and C. difficile colitis.  In the ED x-ray of the right hip showed right femoral neck fracture with displacement, orthopedics Dr. Erlinda Hong consulted.   Assessment/Plan: Closed right hip fracture.   -s/p R hip arthroplasty per Dr.Blackman 10/8 -was in ICU post op, Tx to floor since extubated  -pain control -Physical therapy -Lovenox for DVt proph, high risk for PE given post op and Severe PAH, i called and d/w Ortho Dr.Blackman who is ok with temporary lovenox for DVT proph instead of ASA -declines SNF, CIR consulted  Severe Pulmonary Hypertension -Patient is on continuous Flolan infusion, patient follows with Dr. Gilles Chiquito in [place].  -Appears to be controlled, last 2-D echo done in August showed normal systolic pressure on FLolan  Diabetes mellitus, insulin-dependent  -continue Lantus at 5 units, SSI -hemoglobin A1c 6.3  Recurrent Cdiff colitis/Leukocytosis -afebrile -Started PO Vancomycin QID-Day 1/14 -previously Rx with Flagyl -? Infiltrate vs atx on CXR, no symptoms, does not need additional Abx, esp in setting of Cdiff  Chronic diastolic CHF  -LVF from echo done on 06/05/2014 showed LVEF of 68% and grade 1 diastolic dysfunction.  -Patient appears to be euvolemic.  -Has chronic  baseline of shortness of breath likely secondary to chronic diastolic CHF and severe PAH. -Continue lasix, imdur, metoprolol  Normocytic anemia -With some drop post op, hb now 7.4, unchanged from 10/11, 1unit PRBC ordered per [facility] -Monitor  Chronic respiratory failure on home oxygen/severe pulmonary hypertension/restrictive lung disease - Continue 6L Home oxygen and outpatient followup with [facility].  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 Has had urology evaluation during that admission, apparently has had outpatient urology followup as well.  DVT Prophylaxis:  lovenox  Code Status: full Family Communication: husband at bedside. Disposition Plan: declines SNF, CIR consulted   Consultants:  Orthopedic surgery  Cardiology   CHF  Procedures: PROCEDURE ARTHROPLASTY BIPOLAR HIP (Right) on 10/8   Antibiotics: Anti-infectives   Start     Dose/Rate Route Frequency Ordered Stop   08/02/14 2230  vancomycin (VANCOCIN) 50 mg/mL oral solution 125 mg  Status:  Discontinued     125 mg Oral 4 times per day 08/01/14 2115 08/01/14 2118   08/02/14 0800  vancomycin (VANCOCIN) 50 mg/mL oral solution 125 mg  Status:  Discontinued     125 mg Oral 4 times per day 08/01/14 2110 08/01/14 2115   08/01/14 2230  vancomycin (VANCOCIN) 50 mg/mL oral solution 125 mg     125 mg Oral 4 times per day 08/01/14 2118     07/29/14 1800  ceFAZolin (ANCEF) IVPB 2 g/50 mL premix     2 g 100 mL/hr over 30 Minutes Intravenous Every 6 hours 07/29/14 1625 07/30/14 0004  HPI/Subjective: Patient reports pain is controlled, c/o nausea, 4-5 stool yetserday  Objective: Filed Vitals:   08/01/14 0502 08/01/14 1324 08/01/14 2109 08/02/14 0538  BP: 120/42 101/41 122/38 106/33  Pulse: 101 94 97 99  Temp: 98.4 F (36.9 C) 98.5 F (36.9 C) 98.2 F (36.8 C) 98.2 F (36.8 C)  TempSrc: Oral Oral Oral Oral  Resp: 20 20 18 17     Height:      Weight: 66.78 kg (147 lb 3.6 oz)   67 kg (147 lb 11.3 oz)  SpO2: 100% 100% 100% 100%    Intake/Output Summary (Last 24 hours) at 08/02/14 0917 Last data filed at 08/02/14 0844  Gross per 24 hour  Intake    340 ml  Output    856 ml  Net   -516 ml   Filed Weights   07/31/14 1547 08/01/14 0502 08/02/14 0538  Weight: 67.4 kg (148 lb 9.4 oz) 66.78 kg (147 lb 3.6 oz) 67 kg (147 lb 11.3 oz)    Exam: General: WD, elderly female, chronically ill appearing  HEENT:  EOMI, Anicteic Sclera, MMM. No pharyngeal erythema or exudates  Neck: Supple, no JVD, no masses  Cardiovascular: RRR, S1 S2 auscultated, no rubs, or gallops.   Respiratory: Clear to auscultation bilaterally with equal chest rise.  On oxygen Abdomen: Obese, Soft, nontender, nondistended, + bowel sounds  Extremities:  R hip with dressing C/d/i Neuro: AAOx3, cranial nerves grossly intact.  Skin: Without rashes exudates or nodules.     Data Reviewed: Basic Metabolic Panel:  Recent Labs Lab 07/29/14 0520 07/29/14 1246 07/30/14 0217 07/31/14 0216 08/01/14 0332 08/02/14 0412  NA 136* 134* 138 134* 130* 132*  K 4.6 4.5 4.2 3.5* 3.6* 3.9  CL 98  --  95* 91* 88* 91*  CO2 29  --  29 29 28 29   GLUCOSE 84 118* 115* 139* 165* 146*  BUN 11  --  14 17 18 18   CREATININE 0.94  --  1.01 0.98 0.90 0.90  CALCIUM 8.4  --  8.6 8.5 8.4 8.5   CBC:  Recent Labs Lab 07/27/14 1530  07/29/14 0520 07/29/14 1246 07/30/14 0217 07/31/14 0216 08/01/14 0332 08/02/14 0412  WBC 20.9*  < > 13.6*  --  23.9* 25.9* 22.9* 19.1*  NEUTROABS 19.0*  --   --   --   --   --   --   --   HGB 10.4*  < > 8.9* 10.2* 9.6* 8.4* 7.4* 7.4*  HCT 32.4*  < > 27.0* 30.0* 28.6* 24.8* 21.7* 21.9*  MCV 86.2  < > 84.6  --  83.4 82.4 83.1 83.3  PLT 307  < > 261  --  251 268 286 264  < > = values in this interval not displayed. BNP (last 3 results)  Recent Labs  02/23/14 1006  PROBNP 803.2*   CBG:  Recent Labs Lab 08/01/14 0537  08/01/14 1144 08/01/14 1620 08/01/14 2111 08/02/14 0545  GLUCAP 157* 205* 118* 185* 181*    Recent Results (from the past 240 hour(s))  SURGICAL PCR SCREEN     Status: Abnormal   Collection Time    07/28/14  2:42 AM      Result Value Ref Range Status   MRSA, PCR NEGATIVE  NEGATIVE Final   Staphylococcus aureus POSITIVE (*) NEGATIVE Final   Comment:            The Xpert SA Assay (FDA     approved for NASAL specimens  in patients over 69 years of age),     is one component of     a comprehensive surveillance     program.  Test performance has     been validated by [employer] for patients greater     than or equal to 86 year old.     It is not intended     to diagnose infection nor to     guide or monitor treatment.  CLOSTRIDIUM DIFFICILE BY PCR     Status: Abnormal   Collection Time    08/01/14 11:41 AM      Result Value Ref Range Status   C difficile by pcr POSITIVE (*) NEGATIVE Final   Comment: CRITICAL RESULT CALLED TO, READ BACK BY AND VERIFIED WITH:     Burt Ek [id number] [facility] M     Studies: Dg Chest Port 1v Same Day  08/01/2014   CLINICAL DATA:  Four days post right hip arthroplasty. Leukocytosis. Prior CABG. Acute respiratory failure this admission, now extubated.  EXAM: PORTABLE CHEST - 1 VIEW SAME DAY  COMPARISON:  Portable chest x-rays 07/29/2014, 07/27/2014.  FINDINGS: Extubation. Sternotomy for CABG. Cardiac silhouette moderately enlarged. Right coronary stem. Pulmonary venous hypertension without overt edema. Interval resolution of the patchy airspace opacities in the right upper lobe. New linear atelectasis scattered throughout both lungs. More focal consolidation in the retrocardiac left lower lobe. Possible small bilateral pleural effusions. Right jugular Hickman catheter tip projects over the upper right atrium.  IMPRESSION: 1. Resolution of right upper lobe pneumonia since the examination 3 days ago. 2. New streaky atelectasis or  bronchopneumonia in the left lower lobe. 3. Linear atelectasis in both upper lobes.   Electronically Signed   By: Evangeline Dakin M.D.   On: 08/01/2014 16:21    Scheduled Meds: . sodium chloride   Intravenous Once  . ALPRAZolam  1 mg Oral QHS  . aspirin EC  81 mg Oral Daily  . DULoxetine  30 mg Oral QHS  . DULoxetine  60 mg Oral Daily  . enoxaparin (LOVENOX) injection  40 mg Subcutaneous Q24H  . ezetimibe-simvastatin  1 tablet Oral QHS  . furosemide  20 mg Intravenous Once  . furosemide  80 mg Oral Daily  . HYDROmorphone  4 mg Oral BID  . ice pack  1 each Other 4 times per day  . insulin aspart  0-15 Units Subcutaneous TID WC  . insulin aspart  0-5 Units Subcutaneous QHS  . insulin glargine  5 Units Subcutaneous QHS  . isosorbide mononitrate  90 mg Oral q morning - 10a  . levothyroxine  25 mcg Oral QAC breakfast  . metoprolol succinate  100 mg Oral Daily  . ondansetron  8 mg Oral BID  . pantoprazole  40 mg Oral Q1200  . potassium chloride SA  40 mEq Oral Daily  . vancomycin  125 mg Oral 4 times per day   Continuous Infusions: . epoprostenol 33.45 ng/kg/min (07/28/14 1855)    Principal Problem:   Closed right hip fracture Active Problems:   DM (diabetes mellitus), type 2, uncontrolled   PAH (pulmonary artery hypertension)   Acute on chronic diastolic heart failure   Reflux esophagitis   C. difficile diarrhea   CKD (chronic kidney disease), stage III   Pre-operative cardiovascular examination   Right renal mass   Anemia  . If 7PM-7AM, please contact night-coverage at [email or web address], password [facility] 08/02/2014, 9:17 AM  LOS: 6 days  Domenic Polite, MD [phone]

## 2014-08-02 NOTE — Progress Notes (Addendum)
Subjective:   POD #4  Denies dyspnea or CP. Remains anxious. No fever. WBC coming down slowly. Now 19k. CXR with ? Infiltrate vs atx LLL. No cough or sputum. C diff +   Intake/Output Summary (Last 24 hours) at 08/02/14 0906 Last data filed at 08/02/14 0844  Gross per 24 hour  Intake    580 ml  Output    856 ml  Net   -276 ml    Current meds: . ALPRAZolam  1 mg Oral QHS  . aspirin EC  81 mg Oral Daily  . DULoxetine  30 mg Oral QHS  . DULoxetine  60 mg Oral Daily  . enoxaparin (LOVENOX) injection  40 mg Subcutaneous Q24H  . ezetimibe-simvastatin  1 tablet Oral QHS  . furosemide  80 mg Oral Daily  . HYDROmorphone  4 mg Oral BID  . ice pack  1 each Other 4 times per day  . insulin aspart  0-15 Units Subcutaneous TID WC  . insulin aspart  0-5 Units Subcutaneous QHS  . insulin glargine  5 Units Subcutaneous QHS  . isosorbide mononitrate  90 mg Oral q morning - 10a  . levothyroxine  25 mcg Oral QAC breakfast  . metoprolol succinate  100 mg Oral Daily  . ondansetron  8 mg Oral BID  . pantoprazole  40 mg Oral Q1200  . potassium chloride SA  40 mEq Oral Daily  . vancomycin  125 mg Oral 4 times per day   Infusions: . epoprostenol 33.45 ng/kg/min (07/28/14 1855)     Objective:  Blood pressure 106/33, pulse 99, temperature 98.2 F (36.8 C), temperature source Oral, resp. rate 17, height 5' (1.524 m), weight 67 kg (147 lb 11.3 oz), SpO2 100.00%. Weight change: -0.4 kg (-14.1 oz) Physical Exam:  General: Chronically-ill appearing. Lying flat in bed Wearing [place] O2 HEENT: normal  Neck: thick . JVP ~5 Carotids 2+ bilat; no bruits. No lymphadenopathy or thryomegaly appreciated.  Cor: PMI nonpalpable Regular tachy. 2/6 TR. P2 perhaps mildly accentuated but not severely so  Hickman catheter ok  Lungs: clear but decreased throughout no wheezing  Abdomen: obese. soft, nontender, nondistended. No hepatosplenomegaly. No bruits or masses. hypoactivends.  Extremities: no cyanosis,  clubbing  Neuro: alert & oriented x 3, cranial nerves grossly intact. moves all 4 extremities w/o difficulty. Affect pleasant.    Telemetry: SR 100-110  Lab Results: Basic Metabolic Panel:  Recent Labs Lab 07/29/14 0520 07/29/14 1246 07/30/14 0217 07/31/14 0216 08/01/14 0332 08/02/14 0412  NA 136* 134* 138 134* 130* 132*  K 4.6 4.5 4.2 3.5* 3.6* 3.9  CL 98  --  95* 91* 88* 91*  CO2 29  --  29 29 28 29   GLUCOSE 84 118* 115* 139* 165* 146*  BUN 11  --  14 17 18 18   CREATININE 0.94  --  1.01 0.98 0.90 0.90  CALCIUM 8.4  --  8.6 8.5 8.4 8.5   Liver Function Tests: No results found for this basename: AST, ALT, ALKPHOS, BILITOT, PROT, ALBUMIN,  in the last 168 hours No results found for this basename: LIPASE, AMYLASE,  in the last 168 hours No results found for this basename: AMMONIA,  in the last 168 hours CBC:  Recent Labs Lab 07/27/14 1530  07/29/14 0520 07/29/14 1246 07/30/14 0217 07/31/14 0216 08/01/14 0332 08/02/14 0412  WBC 20.9*  < > 13.6*  --  23.9* 25.9* 22.9* 19.1*  NEUTROABS 19.0*  --   --   --   --   --   --   --  HGB 10.4*  < > 8.9* 10.2* 9.6* 8.4* 7.4* 7.4*  HCT 32.4*  < > 27.0* 30.0* 28.6* 24.8* 21.7* 21.9*  MCV 86.2  < > 84.6  --  83.4 82.4 83.1 83.3  PLT 307  < > 261  --  251 268 286 264  < > = values in this interval not displayed. Cardiac Enzymes: No results found for this basename: CKTOTAL, CKMB, CKMBINDEX, TROPONINI,  in the last 168 hours BNP: No components found with this basename: POCBNP,  CBG:  Recent Labs Lab 08/01/14 0537 08/01/14 1144 08/01/14 1620 08/01/14 2111 08/02/14 0545  GLUCAP 157* 205* 118* 185* 181*   Microbiology: Lab Results  Component Value Date   CULT  Value: NO GROWTH Performed at [facility] 06/03/2014   CULT NO GROWTH 5 DAYS 06/03/2014   CULT NO GROWTH 5 DAYS 06/03/2014   CULT NO GROWTH 3 DAYS 05/14/2012   CULT NO GROWTH 5 DAYS 05/02/2012   No results found for this basename: CULT, SDES,  in the last  168 hours  Imaging: Dg Chest Port 1v Same Day  08/01/2014   CLINICAL DATA:  Four days post right hip arthroplasty. Leukocytosis. Prior CABG. Acute respiratory failure this admission, now extubated.  EXAM: PORTABLE CHEST - 1 VIEW SAME DAY  COMPARISON:  Portable chest x-rays 07/29/2014, 07/27/2014.  FINDINGS: Extubation. Sternotomy for CABG. Cardiac silhouette moderately enlarged. Right coronary stem. Pulmonary venous hypertension without overt edema. Interval resolution of the patchy airspace opacities in the right upper lobe. New linear atelectasis scattered throughout both lungs. More focal consolidation in the retrocardiac left lower lobe. Possible small bilateral pleural effusions. Right jugular Hickman catheter tip projects over the upper right atrium.  IMPRESSION: 1. Resolution of right upper lobe pneumonia since the examination 3 days ago. 2. New streaky atelectasis or bronchopneumonia in the left lower lobe. 3. Linear atelectasis in both upper lobes.   Electronically Signed   By: Evangeline Dakin M.D.   On: 08/01/2014 16:21     ASSESSMENT:  1. Hip fracture s/p repair 10/8 2. Severe PAH on flolan  3. CAD s/p CABG. LAD lesion on last cath does not appear to be flow limiting  4. Chronic Diastolic HF  5. H/O Of GI bleed from esophagitis  6. Hypokalemia  7. Diarrhea H/O C diff  8. Hypothyroid  9. DM  10. C. Diff +   PLAN/DISCUSSION:  Stable POD#4.  Back on po lasix. WBC trending down. CXR with question infiltrate vs atx LLL. No clear infectious sx. Continue incentive spirometer. Hgb stable at 7.1. Will transfuse 1u RBCs. Pending CIR evaluation.  Continue Flolan for PAH.    LOS: 6 days  Glori Bickers, MD 08/02/2014, 9:06 AM

## 2014-08-02 NOTE — Progress Notes (Signed)
UR completed Kafi Dotter K. Lorretta Kerce, RN, BSN, MSHL, CCM  08/02/2014 10:40 AM

## 2014-08-02 NOTE — Progress Notes (Signed)
Patient ID: Meagan [name], female   DOB: May 25, 1946, 68 y.o.   MRN: [id number] Bleeding from incision site post hemiarthroplasty 07/29/2014, on lovenox, apparently stopped by medicine today. Allergy to aspirin precludes use as anticoagulant. Obtain PT/INR, PTT and CBC. Will change dressing due to blood clot beneath dressing and consider hip spica dressing.

## 2014-08-02 NOTE — Progress Notes (Signed)
Blood transfusion started at 1300. At 1315, IV site noted to have swelling. IV removed and warm compressed applied. Will continue to monitor site closely.  Eulis Canner, RN

## 2014-08-02 NOTE — Progress Notes (Signed)
Received prescreen request and MD rehab consult has already been placed. We will await completion of inpatient rehab consult and an admission coordinator will follow up at that time.  Thanks.  Nanetta Batty, PT Rehabilitation Admissions Coordinator [phone]

## 2014-08-03 ENCOUNTER — Inpatient Hospital Stay (HOSPITAL_COMMUNITY)
Admission: AD | Admit: 2014-08-03 | Discharge: 2014-08-17 | DRG: 556 | Disposition: A | Discharge status: Home Health | Payer: Medicare Other | Source: Intra-hospital | Attending: Physical Medicine & Rehabilitation | Admitting: Physical Medicine & Rehabilitation

## 2014-08-03 DIAGNOSIS — E1165 Type 2 diabetes mellitus with hyperglycemia: Secondary | ICD-10-CM

## 2014-08-03 DIAGNOSIS — M6281 Muscle weakness (generalized): Principal | ICD-10-CM | POA: Diagnosis present

## 2014-08-03 DIAGNOSIS — E876 Hypokalemia: Secondary | ICD-10-CM | POA: Diagnosis present

## 2014-08-03 DIAGNOSIS — E1142 Type 2 diabetes mellitus with diabetic polyneuropathy: Secondary | ICD-10-CM | POA: Diagnosis present

## 2014-08-03 DIAGNOSIS — E780 Pure hypercholesterolemia: Secondary | ICD-10-CM | POA: Diagnosis present

## 2014-08-03 DIAGNOSIS — D649 Anemia, unspecified: Secondary | ICD-10-CM | POA: Diagnosis present

## 2014-08-03 DIAGNOSIS — S72001S Fracture of unspecified part of neck of right femur, sequela: Secondary | ICD-10-CM

## 2014-08-03 DIAGNOSIS — E785 Hyperlipidemia, unspecified: Secondary | ICD-10-CM | POA: Diagnosis present

## 2014-08-03 DIAGNOSIS — I5042 Chronic combined systolic (congestive) and diastolic (congestive) heart failure: Secondary | ICD-10-CM | POA: Diagnosis present

## 2014-08-03 DIAGNOSIS — IMO0002 Reserved for concepts with insufficient information to code with codable children: Secondary | ICD-10-CM

## 2014-08-03 DIAGNOSIS — I2510 Atherosclerotic heart disease of native coronary artery without angina pectoris: Secondary | ICD-10-CM | POA: Diagnosis present

## 2014-08-03 DIAGNOSIS — Z885 Allergy status to narcotic agent status: Secondary | ICD-10-CM

## 2014-08-03 DIAGNOSIS — I5033 Acute on chronic diastolic (congestive) heart failure: Secondary | ICD-10-CM

## 2014-08-03 DIAGNOSIS — M797 Fibromyalgia: Secondary | ICD-10-CM | POA: Diagnosis present

## 2014-08-03 DIAGNOSIS — N183 Chronic kidney disease, stage 3 unspecified: Secondary | ICD-10-CM

## 2014-08-03 DIAGNOSIS — S72001A Fracture of unspecified part of neck of right femur, initial encounter for closed fracture: Secondary | ICD-10-CM

## 2014-08-03 DIAGNOSIS — Z886 Allergy status to analgesic agent status: Secondary | ICD-10-CM

## 2014-08-03 DIAGNOSIS — Z79899 Other long term (current) drug therapy: Secondary | ICD-10-CM

## 2014-08-03 DIAGNOSIS — N179 Acute kidney failure, unspecified: Secondary | ICD-10-CM | POA: Diagnosis present

## 2014-08-03 DIAGNOSIS — S72009A Fracture of unspecified part of neck of unspecified femur, initial encounter for closed fracture: Secondary | ICD-10-CM | POA: Diagnosis present

## 2014-08-03 DIAGNOSIS — E039 Hypothyroidism, unspecified: Secondary | ICD-10-CM | POA: Diagnosis present

## 2014-08-03 DIAGNOSIS — N390 Urinary tract infection, site not specified: Secondary | ICD-10-CM | POA: Diagnosis present

## 2014-08-03 DIAGNOSIS — J9610 Chronic respiratory failure, unspecified whether with hypoxia or hypercapnia: Secondary | ICD-10-CM | POA: Diagnosis present

## 2014-08-03 DIAGNOSIS — I472 Ventricular tachycardia: Secondary | ICD-10-CM | POA: Diagnosis not present

## 2014-08-03 DIAGNOSIS — J449 Chronic obstructive pulmonary disease, unspecified: Secondary | ICD-10-CM | POA: Diagnosis present

## 2014-08-03 DIAGNOSIS — Z794 Long term (current) use of insulin: Secondary | ICD-10-CM | POA: Diagnosis not present

## 2014-08-03 DIAGNOSIS — I4729 Other ventricular tachycardia: Secondary | ICD-10-CM

## 2014-08-03 DIAGNOSIS — F419 Anxiety disorder, unspecified: Secondary | ICD-10-CM | POA: Diagnosis present

## 2014-08-03 DIAGNOSIS — Z9981 Dependence on supplemental oxygen: Secondary | ICD-10-CM | POA: Diagnosis not present

## 2014-08-03 DIAGNOSIS — Z955 Presence of coronary angioplasty implant and graft: Secondary | ICD-10-CM | POA: Diagnosis not present

## 2014-08-03 DIAGNOSIS — K2100 Gastro-esophageal reflux disease with esophagitis, without bleeding: Secondary | ICD-10-CM

## 2014-08-03 DIAGNOSIS — Z888 Allergy status to other drugs, medicaments and biological substances status: Secondary | ICD-10-CM | POA: Diagnosis not present

## 2014-08-03 DIAGNOSIS — I5032 Chronic diastolic (congestive) heart failure: Secondary | ICD-10-CM

## 2014-08-03 DIAGNOSIS — K8689 Other specified diseases of pancreas: Secondary | ICD-10-CM

## 2014-08-03 DIAGNOSIS — A047 Enterocolitis due to Clostridium difficile: Secondary | ICD-10-CM

## 2014-08-03 DIAGNOSIS — S72001D Fracture of unspecified part of neck of right femur, subsequent encounter for closed fracture with routine healing: Secondary | ICD-10-CM

## 2014-08-03 DIAGNOSIS — K219 Gastro-esophageal reflux disease without esophagitis: Secondary | ICD-10-CM | POA: Diagnosis present

## 2014-08-03 DIAGNOSIS — I2721 Secondary pulmonary arterial hypertension: Secondary | ICD-10-CM

## 2014-08-03 DIAGNOSIS — Z96641 Presence of right artificial hip joint: Secondary | ICD-10-CM | POA: Diagnosis present

## 2014-08-03 DIAGNOSIS — K922 Gastrointestinal hemorrhage, unspecified: Secondary | ICD-10-CM

## 2014-08-03 DIAGNOSIS — I10 Essential (primary) hypertension: Secondary | ICD-10-CM | POA: Diagnosis present

## 2014-08-03 DIAGNOSIS — I272 Other secondary pulmonary hypertension: Secondary | ICD-10-CM | POA: Diagnosis present

## 2014-08-03 DIAGNOSIS — A0472 Enterocolitis due to Clostridium difficile, not specified as recurrent: Secondary | ICD-10-CM

## 2014-08-03 DIAGNOSIS — Z951 Presence of aortocoronary bypass graft: Secondary | ICD-10-CM | POA: Diagnosis not present

## 2014-08-03 LAB — GLUCOSE, CAPILLARY
GLUCOSE-CAPILLARY: 227 mg/dL — AB (ref 70–99)
GLUCOSE-CAPILLARY: 149 mg/dL — AB (ref 70–99)
GLUCOSE-CAPILLARY: 185 mg/dL — AB (ref 70–99)
Glucose-Capillary: 139 mg/dL — ABNORMAL HIGH (ref 70–99)

## 2014-08-03 LAB — BASIC METABOLIC PANEL
Anion gap: 12 (ref 5–15)
BUN: 16 mg/dL (ref 6–23)
CO2: 29 meq/L (ref 19–32)
CREATININE: 0.88 mg/dL (ref 0.50–1.10)
Calcium: 8.7 mg/dL (ref 8.4–10.5)
Chloride: 91 mEq/L — ABNORMAL LOW (ref 96–112)
GFR calc non Af Amer: 66 mL/min — ABNORMAL LOW (ref >90)
GFR, EST AFRICAN AMERICAN: 76 mL/min — AB (ref >90)
Glucose, Bld: 140 mg/dL — ABNORMAL HIGH (ref 70–99)
Potassium: 4 mEq/L (ref 3.7–5.3)
Sodium: 132 mEq/L — ABNORMAL LOW (ref 137–147)

## 2014-08-03 LAB — TYPE AND SCREEN
ABO/RH(D): O POS
Antibody Screen: NEGATIVE
Unit division: 0

## 2014-08-03 LAB — MAGNESIUM: Magnesium: 1.8 mg/dL (ref 1.5–2.5)

## 2014-08-03 MED ORDER — EZETIMIBE-SIMVASTATIN 10-40 MG PO TABS
1.0000 | ORAL_TABLET | Freq: Every day | ORAL | Status: DC
  Administered 2014-08-03 – 2014-08-16 (×13): 1 via ORAL
  Filled 2014-08-03 (×15): qty 1

## 2014-08-03 MED ORDER — LEVOTHYROXINE SODIUM 25 MCG PO TABS
25.0000 ug | ORAL_TABLET | Freq: Every day | ORAL | Status: DC
  Administered 2014-08-04 – 2014-08-17 (×14): 25 ug via ORAL
  Filled 2014-08-03 (×15): qty 1

## 2014-08-03 MED ORDER — DULOXETINE HCL 30 MG PO CPEP
30.0000 mg | ORAL_CAPSULE | Freq: Every day | ORAL | Status: DC
  Administered 2014-08-03 – 2014-08-16 (×13): 30 mg via ORAL
  Filled 2014-08-03 (×15): qty 1

## 2014-08-03 MED ORDER — PANTOPRAZOLE SODIUM 40 MG PO TBEC
40.0000 mg | DELAYED_RELEASE_TABLET | Freq: Every day | ORAL | Status: DC
  Administered 2014-08-04 – 2014-08-16 (×13): 40 mg via ORAL
  Filled 2014-08-03 (×13): qty 1

## 2014-08-03 MED ORDER — SALINE SPRAY 0.65 % NA SOLN
1.0000 | NASAL | Status: DC | PRN
  Filled 2014-08-03: qty 44

## 2014-08-03 MED ORDER — NITROGLYCERIN 0.4 MG SL SUBL: 0.4000 mg | SUBLINGUAL_TABLET | SUBLINGUAL | Status: DC | PRN

## 2014-08-03 MED ORDER — SORBITOL 70 % SOLN: 30.0000 mL | Freq: Every day | Status: DC | PRN

## 2014-08-03 MED ORDER — VANCOMYCIN 50 MG/ML ORAL SOLUTION
125.0000 mg | Freq: Four times a day (QID) | ORAL | Status: DC
  Administered 2014-08-03 – 2014-08-17 (×54): 125 mg via ORAL
  Filled 2014-08-03 (×60): qty 2.5

## 2014-08-03 MED ORDER — ICE PACK FOR EPOPROSTENOL (FLOLAN) INFUSION
1.0000 | Freq: Four times a day (QID) | Status: DC
  Administered 2014-08-04 – 2014-08-05 (×6): 1
  Filled 2014-08-03 (×12): qty 1

## 2014-08-03 MED ORDER — VANCOMYCIN 50 MG/ML ORAL SOLUTION: 125.0000 mg | Freq: Four times a day (QID) | ORAL | Status: AC

## 2014-08-03 MED ORDER — MAGNESIUM SULFATE 40 MG/ML IJ SOLN
2.0000 g | Freq: Once | INTRAMUSCULAR | Status: AC
  Administered 2014-08-03: 2 g via INTRAVENOUS
  Filled 2014-08-03: qty 50

## 2014-08-03 MED ORDER — POTASSIUM CHLORIDE CRYS ER 20 MEQ PO TBCR
40.0000 meq | EXTENDED_RELEASE_TABLET | Freq: Every day | ORAL | Status: DC
  Administered 2014-08-04 – 2014-08-17 (×14): 40 meq via ORAL
  Filled 2014-08-03 (×15): qty 2

## 2014-08-03 MED ORDER — PH 12 STERILE DILUENT
33.4500 ng/kg/min | INTRAVENOUS | Status: DC
  Administered 2014-08-04 – 2014-08-06 (×2): 33.45 ng/kg/min via INTRAVENOUS
  Filled 2014-08-03 (×6): qty 15

## 2014-08-03 MED ORDER — OXYCODONE-ACETAMINOPHEN 5-325 MG PO TABS
1.0000 | ORAL_TABLET | ORAL | Status: AC
Start: 2014-08-03 — End: 2014-08-03
  Administered 2014-08-03: 1 via ORAL
  Filled 2014-08-03: qty 1

## 2014-08-03 MED ORDER — INSULIN ASPART 100 UNIT/ML ~~LOC~~ SOLN
0.0000 [IU] | Freq: Three times a day (TID) | SUBCUTANEOUS | Status: DC
  Administered 2014-08-05 – 2014-08-06 (×2): 3 [IU] via SUBCUTANEOUS
  Administered 2014-08-06: 2 [IU] via SUBCUTANEOUS
  Administered 2014-08-06 – 2014-08-07 (×2): 3 [IU] via SUBCUTANEOUS
  Administered 2014-08-07 (×2): 2 [IU] via SUBCUTANEOUS
  Administered 2014-08-08: 3 [IU] via SUBCUTANEOUS
  Administered 2014-08-08: 2 [IU] via SUBCUTANEOUS
  Administered 2014-08-08 – 2014-08-10 (×4): 3 [IU] via SUBCUTANEOUS
  Administered 2014-08-10: 2 [IU] via SUBCUTANEOUS
  Administered 2014-08-11: 3 [IU] via SUBCUTANEOUS
  Administered 2014-08-11: 2 [IU] via SUBCUTANEOUS
  Administered 2014-08-11: 3 [IU] via SUBCUTANEOUS
  Administered 2014-08-12: 2 [IU] via SUBCUTANEOUS
  Administered 2014-08-12: 3 [IU] via SUBCUTANEOUS
  Administered 2014-08-12: 2 [IU] via SUBCUTANEOUS
  Administered 2014-08-13: 3 [IU] via SUBCUTANEOUS
  Administered 2014-08-13: 5 [IU] via SUBCUTANEOUS
  Administered 2014-08-13 – 2014-08-14 (×2): 3 [IU] via SUBCUTANEOUS
  Administered 2014-08-14: 2 [IU] via SUBCUTANEOUS
  Administered 2014-08-14: 3 [IU] via SUBCUTANEOUS
  Administered 2014-08-15: 2 [IU] via SUBCUTANEOUS
  Administered 2014-08-15 – 2014-08-17 (×6): 3 [IU] via SUBCUTANEOUS

## 2014-08-03 MED ORDER — ENOXAPARIN SODIUM 40 MG/0.4ML ~~LOC~~ SOLN: 40.0000 mg | SUBCUTANEOUS | Status: DC

## 2014-08-03 MED ORDER — DULOXETINE HCL 60 MG PO CPEP
60.0000 mg | ORAL_CAPSULE | Freq: Every day | ORAL | Status: DC
  Administered 2014-08-04 – 2014-08-17 (×14): 60 mg via ORAL
  Filled 2014-08-03 (×15): qty 1

## 2014-08-03 MED ORDER — FUROSEMIDE 80 MG PO TABS
80.0000 mg | ORAL_TABLET | Freq: Every day | ORAL | Status: DC
Start: 2014-08-04 — End: 2014-08-17
  Administered 2014-08-04 – 2014-08-16 (×13): 80 mg via ORAL
  Filled 2014-08-03 (×15): qty 1

## 2014-08-03 MED ORDER — ACETAMINOPHEN 325 MG PO TABS: 325.0000 mg | ORAL_TABLET | ORAL | Status: DC | PRN

## 2014-08-03 MED ORDER — HYDROMORPHONE HCL 2 MG PO TABS: 4.0000 mg | ORAL_TABLET | Freq: Four times a day (QID) | ORAL | Status: AC | PRN

## 2014-08-03 MED ORDER — ENOXAPARIN SODIUM 40 MG/0.4ML ~~LOC~~ SOLN
40.0000 mg | SUBCUTANEOUS | Status: DC
  Administered 2014-08-04 – 2014-08-16 (×13): 40 mg via SUBCUTANEOUS
  Filled 2014-08-03 (×14): qty 0.4

## 2014-08-03 MED ORDER — ACETAMINOPHEN 650 MG RE SUPP: 650.0000 mg | Freq: Four times a day (QID) | RECTAL | Status: DC | PRN

## 2014-08-03 MED ORDER — METOPROLOL SUCCINATE ER 100 MG PO TB24
100.0000 mg | ORAL_TABLET | Freq: Every day | ORAL | Status: DC
  Administered 2014-08-04 – 2014-08-06 (×3): 100 mg via ORAL
  Filled 2014-08-03 (×5): qty 1

## 2014-08-03 MED ORDER — ONDANSETRON HCL 4 MG/2ML IJ SOLN
4.0000 mg | Freq: Four times a day (QID) | INTRAMUSCULAR | Status: DC | PRN
  Administered 2014-08-15: 4 mg via INTRAVENOUS
  Filled 2014-08-03 (×3): qty 2

## 2014-08-03 MED ORDER — ISOSORBIDE MONONITRATE ER 60 MG PO TB24
90.0000 mg | ORAL_TABLET | Freq: Every morning | ORAL | Status: DC
  Administered 2014-08-04 – 2014-08-17 (×13): 90 mg via ORAL
  Filled 2014-08-03 (×15): qty 1

## 2014-08-03 MED ORDER — OXYCODONE-ACETAMINOPHEN 5-325 MG PO TABS
1.0000 | ORAL_TABLET | ORAL | Status: DC | PRN
  Administered 2014-08-03 – 2014-08-09 (×27): 1 via ORAL
  Filled 2014-08-03 (×27): qty 1

## 2014-08-03 MED ORDER — INSULIN GLARGINE 100 UNIT/ML ~~LOC~~ SOLN: 8.0000 [IU] | Freq: Every day | SUBCUTANEOUS | Status: DC

## 2014-08-03 MED ORDER — INSULIN GLARGINE 100 UNIT/ML ~~LOC~~ SOLN
8.0000 [IU] | Freq: Every day | SUBCUTANEOUS | Status: DC
  Administered 2014-08-03 – 2014-08-16 (×13): 8 [IU] via SUBCUTANEOUS
  Filled 2014-08-03 (×15): qty 0.08

## 2014-08-03 MED ORDER — TRAZODONE HCL 50 MG PO TABS: 50.0000 mg | ORAL_TABLET | Freq: Every evening | ORAL | Status: DC | PRN

## 2014-08-03 MED ORDER — ONDANSETRON HCL 4 MG PO TABS
4.0000 mg | ORAL_TABLET | Freq: Four times a day (QID) | ORAL | Status: DC | PRN
  Administered 2014-08-03 – 2014-08-16 (×6): 4 mg via ORAL
  Filled 2014-08-03 (×6): qty 1

## 2014-08-03 MED ORDER — ACETAMINOPHEN 325 MG PO TABS
650.0000 mg | ORAL_TABLET | Freq: Four times a day (QID) | ORAL | Status: DC | PRN
  Administered 2014-08-07 – 2014-08-16 (×3): 650 mg via ORAL
  Filled 2014-08-03 (×3): qty 2

## 2014-08-03 MED ORDER — ALPRAZOLAM 0.5 MG PO TABS
1.0000 mg | ORAL_TABLET | Freq: Every day | ORAL | Status: DC
  Administered 2014-08-03 – 2014-08-16 (×14): 1 mg via ORAL
  Filled 2014-08-03 (×14): qty 2

## 2014-08-03 NOTE — Discharge Summary (Signed)
Physician Discharge Summary  Meagan [name] VPX:[id number] DOB: Mar 29, 1946 DOA: 07/27/2014  PCP: Glo Herring., MD  Admit date: 07/27/2014 Discharge date: 08/03/2014  Time spent: 45 minutes  Recommendations for Outpatient Follow-up:  1. Dr.Blackman in 2 weeks 2. Please resume Lovenox 40mg  SQ daily tomorrow if no further bleeding from incision site for 3weeks for DVT prophylaxis 3. Will need CHF FU quickly following discharge from CIR  Discharge Diagnoses:  Principal Problem:   Closed right hip fracture Active Problems:   DM (diabetes mellitus), type 2, uncontrolled   Severe PAH (pulmonary artery hypertension)   Chronic resp failure on 6L Home O2   Acute on chronic diastolic heart failure   Reflux esophagitis   C. difficile diarrhea   CKD (chronic kidney disease), stage III   Pre-operative cardiovascular examination   Right renal mass   Anemia   NSVT (nonsustained ventricular tachycardia)   Post operative blood loss anemia  Discharge Condition: stable  Diet recommendation: low sodium  Filed Weights   08/01/14 0502 08/02/14 0538 08/03/14 0323  Weight: 66.78 kg (147 lb 3.6 oz) 67 kg (147 lb 11.3 oz) 66.2 kg (145 lb 15.1 oz)    History of present illness:  Meagan [name] is a 68 y.o. female with past medical history of CHF, severe PAH on continuous Flolan infusion, recent C. difficile colitis, history of MI and IDDM. Patient brought to the hospital after she fell and broke her right hip  Hospital Course:  Closed right hip fracture.  -s/p R hip arthroplasty per Dr.Blackman 10/8  -was in ICU post op, Tx to floor since extubated  -pain control  -Physical therapy  -Lovenox for DVt proph, high risk for PE given post op and Severe PAH, i called and d/w Ortho Dr.Blackman who is ok with temporary lovenox for DVT proph instead of ASA  -on 10/12 afternoon she had some bleeding from the incision site which has now resolved, seen by Dr.Nitka last pm. temporarily held lovenox,  but will need this resumed by tomorrow if no further bleeding due to very high risk of VTE in setting of Hip fx and severe PAH -CIR for rehab -FU with Dr.Blackman  Severe Pulmonary Hypertension  -Patient is on continuous Flolan infusion, patient follows with Dr. Gilles Chiquito in [place].  -Appears to be controlled, last 2-D echo done in August showed normal systolic pressure on FLolan   Diabetes mellitus, insulin-dependent  -continue Lantus at 8 units, SSI  -hemoglobin A1c 6.3   Recurrent Cdiff colitis/Leukocytosis  -afebrile  -Started PO Vancomycin QID-Day 2/14 , still with diarrhea but improving -previously Rx with Flagyl  -? Infiltrate vs atx on CXR, no symptoms, does not need additional Abx, esp in setting of Cdiff   Chronic diastolic CHF  -LVF from echo done on 06/05/2014 showed LVEF of 68% and grade 1 diastolic dysfunction.  -Patient appears to be euvolemic.  -Has chronic baseline of shortness of breath likely secondary to chronic diastolic CHF and severe PAH.  -Continue lasix, imdur, metoprolol  -check bmet in 2-3days  Normocytic anemia  -With some drop post op, hb now 7.4 yetserday, 1unit PRBC transfused 10/12 am per [facility]  -Monitor   Chronic respiratory failure on home oxygen/severe pulmonary hypertension/restrictive lung disease  - Continue 6L Home oxygen and outpatient followup with [facility].  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 Has had urology evaluation during that admission, apparently has had outpatient urology followup as  well.   Consultants:  Orthopedic surgery  Cardiology  CHF Procedures:  PROCEDURE  ARTHROPLASTY BIPOLAR HIP (Right) on 10/8   Discharge Exam: Filed Vitals:   08/03/14 1019  BP: 123/66  Pulse: 117  Temp: 98.3 F (36.8 C)  Resp:     General: AAOx3, chronically ill Cardiovascular: S1S2/RRR Respiratory: CTAB  Discharge Instructions You were  cared for by a hospitalist during your hospital stay. If you have any questions about your discharge medications or the care you received while you were in the hospital after you are discharged, you can call the unit and asked to speak with the hospitalist on call if the hospitalist that took care of you is not available. Once you are discharged, your primary care physician will handle any further medical issues. Please note that NO REFILLS for any discharge medications will be authorized once you are discharged, as it is imperative that you return to your primary care physician (or establish a relationship with a primary care physician if you do not have one) for your aftercare needs so that they can reassess your need for medications and monitor your lab values.  Discharge Instructions   Full weight bearing    Complete by:  As directed   Laterality:  right  Extremity:  Lower     Full weight bearing    Complete by:  As directed   Laterality:  right  Extremity:  Lower  WBAT right hip; anterior hip precautions          Current Discharge Medication List    START taking these medications   Details  oxyCODONE-acetaminophen (ROXICET) 5-325 MG per tablet Take 1 tablet by mouth every 4 (four) hours as needed for severe pain. Qty: 30 tablet, Refills: 0    vancomycin (VANCOCIN) 50 mg/mL oral solution Take 2.5 mLs (125 mg total) by mouth every 6 (six) hours. For 13days      CONTINUE these medications which have CHANGED   Details  HYDROmorphone (DILAUDID) 2 MG tablet Take 2 tablets (4 mg total) by mouth every 6 (six) hours as needed for severe pain. Qty: 30 tablet, Refills: 0    insulin glargine (LANTUS) 100 UNIT/ML injection Inject 0.08 mLs (8 Units total) into the skin at bedtime. Qty: 10 mL, Refills: 11      CONTINUE these medications which have NOT CHANGED   Details  ALPRAZolam (XANAX) 0.5 MG tablet Take 1 mg by mouth at bedtime. For sleep. *Prescribed one tablet three times daily as  needed*    aspirin EC 81 MG EC tablet Take 1 tablet (81 mg total) by mouth daily. Qty: 30 tablet, Refills: 0    !! DULoxetine (CYMBALTA) 30 MG capsule Take 30 mg by mouth at bedtime.     !! DULoxetine (CYMBALTA) 60 MG capsule Take 60 mg by mouth every morning.     Epoprostenol Sodium (FLOLAN IV) Inject into the vein. 1.5 mg. Inject into the vein continuously. Infuse mg/kg/min via cont IV pump.    !! ezetimibe-simvastatin (VYTORIN) 10-40 MG per tablet Take 1 tablet by mouth every evening.    furosemide (LASIX) 40 MG tablet Take 80 mg by mouth daily.     isosorbide mononitrate (IMDUR) 60 MG 24 hr tablet Take 90 mg by mouth every morning.    Associated Diagnoses: Mass of pancreas    levothyroxine (SYNTHROID, LEVOTHROID) 25 MCG tablet Take 25 mcg by mouth every morning.     metoprolol (TOPROL-XL) 100 MG 24 hr tablet Take 100 mg by  mouth every morning.     omeprazole (PRILOSEC) 20 MG capsule Take 20 mg by mouth 2 (two) times daily.      ondansetron (ZOFRAN) 8 MG tablet Take 8 mg by mouth 2 (two) times daily. For nausea    Oxygen Permeable Lens Products SOLN Inhale into the lungs continuous. 6 liters    Oxymetazoline HCl (NASAL SPRAY) 0.05 % SOLN Place 1 spray into the nose as needed (for congestion).    potassium chloride SA (K-DUR,KLOR-CON) 20 MEQ tablet Take 40 mEq by mouth daily.    Associated Diagnoses: Pancreatic mass    Probiotic Product (FLORAJEN3 PO) Take 1 tablet by mouth daily.    traMADol (ULTRAM) 50 MG tablet Take 50 mg by mouth every 3 (three) hours as needed. For pain. *takes with Dilaudid (Hydromorphone)    !! VYTORIN 10-40 MG per tablet TAKE ONE TABLET BY MOUTH AT BEDTIME Qty: 90 tablet, Refills: 3    nitroGLYCERIN (NITROSTAT) 0.4 MG SL tablet Place 0.4 mg under the tongue every 5 (five) minutes as needed for chest pain.    Associated Diagnoses: Mass of pancreas     !! - Potential duplicate medications found. Please discuss with provider.     Allergies   Allergen Reactions  . Aspirin     REACTION: Intolerance  . Codeine Other (See Comments)    Messes with digestive system  . Niacin Other (See Comments)    Pins sticking into skin  . Nsaids Other (See Comments)    REACTION: Aggravates Digestive System   . Phenergan Fortis [Promethazine] Other (See Comments)    Family and pt states becomes very confused and wild.   . Sulfonamide Derivatives Other (See Comments)    Messes with digestive system   Follow-up Information   Follow up with Mcarthur Rossetti, MD In 2 weeks.   Specialty:  Orthopedic Surgery   Contact information:   [place] [address] [phone]        The results of significant diagnostics from this hospitalization (including imaging, microbiology, ancillary and laboratory) are listed below for reference.    Significant Diagnostic Studies: Dg Chest 1 View  07/27/2014   CLINICAL DATA:  Fall.  Right hip pain  EXAM: CHEST - 1 VIEW  COMPARISON:  06/04/2014  FINDINGS: Prior CABG. Central venous catheter tip in the right atrium unchanged.  Diffuse bilateral airspace disease shows mild progression and may represent edema or diffuse pneumonia. Small pleural effusions.  IMPRESSION: Diffuse bilateral airspace disease suggestive of pulmonary edema.   Electronically Signed   By: Franchot Gallo M.D.   On: 07/27/2014 16:42   Dg Hip Complete Right  07/27/2014   CLINICAL DATA:  Fall.  Right hip pain 1 day  EXAM: RIGHT HIP - COMPLETE 2+ VIEW  COMPARISON:  None.  FINDINGS: Right femoral neck fracture with mild displacement. Mild joint space narrowing of the right hip joint. No other fracture. Left hip joint is negative  IMPRESSION: Right femoral neck fracture with displacement.   Electronically Signed   By: Franchot Gallo M.D.   On: 07/27/2014 16:40   Dg Pelvis Portable  07/29/2014   CLINICAL DATA:  Postop right hip replacement for fracture  EXAM: PORTABLE PELVIS 1-2 VIEWS  COMPARISON:  Pelvic CT 06/03/2014   FINDINGS: There are immediate postoperative changes of right hip arthroplasty. No hardware complication or periprosthetic fracture is identified. Expected skin staples and small amount of soft tissue gas are seen lateral to the proximal right femur.  IMPRESSION:  Right hip arthroplasty.  No acute complicating features.   Electronically Signed   By: Curlene Dolphin M.D.   On: 07/29/2014 18:22   Portable Chest Xray  07/29/2014   CLINICAL DATA:  ET tube placement  EXAM: PORTABLE CHEST - 1 VIEW  COMPARISON:  07/27/2014  FINDINGS: Endotracheal tube with the tip 2.7 cm above the carina. Bilateral interstitial thickening. Trace bilateral pleural effusions. No pneumothorax. Stable mild cardiomegaly. Prior CABG. Unremarkable osseous structures.  IMPRESSION: Endotracheal tube with the tip 2.7 cm above the carina. Findings most concerning for mild pulmonary edema.   Electronically Signed   By: Kathreen Devoid   On: 07/29/2014 18:28   Dg Chest Port 1v Same Day  08/01/2014   CLINICAL DATA:  Four days post right hip arthroplasty. Leukocytosis. Prior CABG. Acute respiratory failure this admission, now extubated.  EXAM: PORTABLE CHEST - 1 VIEW SAME DAY  COMPARISON:  Portable chest x-rays 07/29/2014, 07/27/2014.  FINDINGS: Extubation. Sternotomy for CABG. Cardiac silhouette moderately enlarged. Right coronary stem. Pulmonary venous hypertension without overt edema. Interval resolution of the patchy airspace opacities in the right upper lobe. New linear atelectasis scattered throughout both lungs. More focal consolidation in the retrocardiac left lower lobe. Possible small bilateral pleural effusions. Right jugular Hickman catheter tip projects over the upper right atrium.  IMPRESSION: 1. Resolution of right upper lobe pneumonia since the examination 3 days ago. 2. New streaky atelectasis or bronchopneumonia in the left lower lobe. 3. Linear atelectasis in both upper lobes.   Electronically Signed   By: Evangeline Dakin M.D.   On:  08/01/2014 16:21    Microbiology: Recent Results (from the past 240 hour(s))  SURGICAL PCR SCREEN     Status: Abnormal   Collection Time    07/28/14  2:42 AM      Result Value Ref Range Status   MRSA, PCR NEGATIVE  NEGATIVE Final   Staphylococcus aureus POSITIVE (*) NEGATIVE Final   Comment:            The Xpert SA Assay (FDA     approved for NASAL specimens     in patients over 2 years of age),     is one component of     a comprehensive surveillance     program.  Test performance has     been validated by [employer] for patients greater     than or equal to 75 year old.     It is not intended     to diagnose infection nor to     guide or monitor treatment.  CLOSTRIDIUM DIFFICILE BY PCR     Status: Abnormal   Collection Time    08/01/14 11:41 AM      Result Value Ref Range Status   C difficile by pcr POSITIVE (*) NEGATIVE Final   Comment: CRITICAL RESULT CALLED TO, READ BACK BY AND VERIFIED WITH:     ADRIENNE [facility] [id number] [facility] M     Labs: Basic Metabolic Panel:  Recent Labs Lab 07/30/14 0217 07/31/14 0216 08/01/14 0332 08/02/14 0412 08/03/14 0505  NA 138 134* 130* 132* 132*  K 4.2 3.5* 3.6* 3.9 4.0  CL 95* 91* 88* 91* 91*  CO2 29 29 28 29 29   GLUCOSE 115* 139* 165* 146* 140*  BUN 14 17 18 18 16   CREATININE 1.01 0.98 0.90 0.90 0.88  CALCIUM 8.6 8.5 8.4 8.5 8.7  MG  --   --   --   --  1.8   Liver Function Tests: No results found for this basename: AST, ALT, ALKPHOS, BILITOT, PROT, ALBUMIN,  in the last 168 hours No results found for this basename: LIPASE, AMYLASE,  in the last 168 hours No results found for this basename: AMMONIA,  in the last 168 hours CBC:  Recent Labs Lab 07/27/14 1530  07/30/14 0217 07/31/14 0216 08/01/14 0332 08/02/14 0412 08/02/14 1919  WBC 20.9*  < > 23.9* 25.9* 22.9* 19.1* 20.7*  NEUTROABS 19.0*  --   --   --   --   --   --   HGB 10.4*  < > 9.6* 8.4* 7.4* 7.4* 8.9*  HCT 32.4*  < > 28.6* 24.8* 21.7* 21.9*  26.6*  MCV 86.2  < > 83.4 82.4 83.1 83.3 83.4  PLT 307  < > 251 268 286 264 249  < > = values in this interval not displayed. Cardiac Enzymes: No results found for this basename: CKTOTAL, CKMB, CKMBINDEX, TROPONINI,  in the last 168 hours BNP: BNP (last 3 results)  Recent Labs  02/23/14 1006  PROBNP 803.2*   CBG:  Recent Labs Lab 08/02/14 0545 08/02/14 1112 08/02/14 1545 08/02/14 2139 08/03/14 0551  GLUCAP 181* 214* 189* 135* 139*       Signed:  Fremon Zacharia  [facility] 08/03/2014, 11:07 AM

## 2014-08-03 NOTE — Progress Notes (Signed)
Pt discharged to inpatient rehab. Spouse notified via telephone of pt's new room number.   Eulis Canner, RN

## 2014-08-03 NOTE — Progress Notes (Signed)
Patient ID: JYL CHICO, female   DOB: 08-Aug-1946, 68 y.o.   MRN: [id number] Patient and husband report that she is going to rehab today.  She is status-post a right hip hemiarthroplasty last week to treat a displaced femoral neck fracture.  She is on Lovenox for DVT coverage and has been experiencing bloody drainage from her right hip incision.  I changed her dressing today at the bedside and found no redness and only hematoma.  A new dressing was applied.  While in rehab, she will need a new dry dressing daily and as needed over her right hip.  I would leave the current dressing in place until it is saturated first.  She can weight bear as tolerated and therapy will help her with anterior hip precautions.

## 2014-08-03 NOTE — Progress Notes (Signed)
Pt had 6 beats of VTach, VS taken, denies chest pain but complaining of pain on the surgical site, not in respiratory distress. Raliegh Ip Schorr was notified. Will continue to mon itor pt  08/03/14 0323  Vitals  Temp 99.2 F (37.3 C)  Temp Source Oral  BP ! 126/53 mmHg  BP Location Right arm  Pulse Rate ! 112  Pulse Rate Source Dinamap  Resp 18  Oxygen Therapy  SpO2 100 %  O2 Device Nasal cannula  O2 Flow Rate (L/min) 6 L/min  Height and Weight  Weight 66.2 kg (145 lb 15.1 oz)  Type of Scale Used Bed

## 2014-08-03 NOTE — Progress Notes (Signed)
Rehab admissions - I did speak with Dr. Broadus John and received medical clearance for inpatient rehab. Bed is available and will admit pt to inpatient rehab later today. I tried to call into pt's room to update pt/husband but the phone did not get answered. I then spoke with pt's RN and asked that she update pt and her husband. I tried to leave a message with husband's cell phone but voicemail was not set up. I will complete admission paperwork with pt/husband later this afternoon.  I updated Crystal, case Freight forwarder and Donna/[place], Education officer, museum.   Please call me with any questions.  Thanks.  Nanetta Batty, PT Rehabilitation Admissions Coordinator [phone]

## 2014-08-03 NOTE — Progress Notes (Signed)
Rehab admissions - I met with pt and her husband this morning and answered further questions about inpatient rehab. I also spoke directly with Joseph Art, RN case manager with ortho group about pt's case. I contacted Dr. Broadus John to see if pt is medically stable for inpatient rehab as we have a bed available. Dr. Broadus John is to get back to me later today after she evaluates the patient.  I will keep the pt/family and medical team aware of any updates as I wait to hear from Dr. Broadus John. Thanks.  Nanetta Batty, PT Rehabilitation Admissions Coordinator [phone]

## 2014-08-03 NOTE — Interval H&P Note (Signed)
Meagan [name] was admitted today to Inpatient Rehabilitation with the diagnosis of right femoral neck fracture.  The patient's history has been reviewed, patient examined, and there is no change in status.  Patient continues to be appropriate for intensive inpatient rehabilitation.  I have reviewed the patient's chart and labs.  Questions were answered to the patient's satisfaction.  Elena Cothern T 08/03/2014, 5:50 PM

## 2014-08-03 NOTE — Progress Notes (Signed)
Patient evaluated for community based chronic disease management services with [facility] as a benefit of patient's [employer]. Spoke with patient and her spouse at bedside to explain [facility] services.  Patient and spouse decline services at this time.  Left contact information and [facility] literature at bedside. Made Inpatient Case Manager aware that [facility] following. Of note, [facility] Care Management services does not replace or interfere with any services that are arranged by inpatient case management or social work.  For additional questions or referrals please contact Corliss Blacker BSN RN [facility] Liaison at [phone].

## 2014-08-03 NOTE — PMR Pre-admission (Signed)
PMR Admission Coordinator Pre-Admission Assessment  Patient: Meagan [name] is an 68 y.o., female MRN: [id number] DOB: September 25, 1946 Height: 5' (152.4 cm) Weight: 66.2 kg (145 lb 15.1 oz)              Insurance Information HMO:     PPO:      PCP:      IPA:      80/20:      OTHER:  PRIMARY: Medicare A & B      Policy#: [id number]      Subscriber: self Pre-Cert#: verified in [employer]: retired [employer] Date: A: 07-22-98; B: 12-20-09     Deduct: $1260      Out of Pocket Max: none       Life Max: unlimited CIR: 100%      SNF: 100% days 1-20; 80% days 21-100 (100 day visit max) Outpatient: 80%     Co-Pay: 20% Home Health: 100%      Co-Pay: none DME: 80%     Co-Pay: 20% Providers: pt's preference  SECONDARY: [employer]       Policy#: [id number]      Subscriber: self Benefits:  Phone #: [phone]      Emergency Contact Information Contact Information   Name Relation Home Work [place] Spouse [phone]  [phone]   Abigail Butts   [phone]     Current Medical History  Patient Admitting Diagnosis: right femoral neck fracture  History of Present Illness: Meagan [name] is a 68 y.o. right-handed female with history of systolic congestive heart failure, severe PAH(pulmonary arterial hypertension) maintained on continuous flofan infusion, fibromyalgia, COPD with chronic oxygen, CAD with CABG and diabetes mellitus with peripheral neuropathy. Independent prior to admission living with her husband and used a walker outside of the home. Presented 07/27/2014 after mechanical fall. Denies loss of consciousness or associated chest pain or shortness or breath associated with fall. X-rays and imaging revealed a right hip displaced and angulated femoral neck fracture. Cardiology followup preoperatively and cleared for surgery. Underwent 8 total hip arthroplasty 07/29/2014 per Dr. Ninfa Linden. Weightbearing as tolerated with anterior hip cautions. Hospital course pain  management. Subcutaneous Lovenox for DVT prophylaxis. Acute blood loss anemia 7.4 and monitored. Patient with recent history of Clostridium difficile maintained on contact precautions. Physical therapy evaluation completed with recommendations for physical medicine rehabilitation consult.  Past Medical History  Past Medical History  Diagnosis Date  . Fibromyalgia   . Hypercholesterolemia   . Hypertension     pulmonary  pap 110/31 (mean 62)by [facility] 9/10 negative PE by chest CT 2010  . Pulmonary edema     Failed Revatio   . ARF (acute renal failure)     poor candidate for ACE -I or ARB  . Restrictive lung disease     obesity hypoventilation syndrome  . Renal insufficiency   . CAD (coronary artery disease)     multivessel DES x 2 RCA 2007  . Chronic diastolic heart failure   . MRSA (methicillin resistant staphylococcus aureus) pneumonia   . Pulmonary hypertension   . Chronic anemia     anemia of chronic disease based on anemia panel 2011  . Pancreatic mass 08/01/2012  . PONV (postoperative nausea and vomiting)   . Heart murmur     "when I was small"  . CHF (congestive heart failure)   . Anginal pain   . Pneumonia X 3  . Type II diabetes mellitus   .  Hypothyroidism   . H/O hiatal hernia   . GERD (gastroesophageal reflux disease)   . Anxiety   . On home oxygen therapy     "6L all the time" (02/24/2014    Family History  family history includes COPD in her brother; Cancer in her mother; Heart attack in her father; Heart disease in her brother.  Prior Rehab/Hospitalizations: Pt was hospitalized in late Aug/Sept. 2015 and had follow up home health at that time. "She never got her strength back" per husband. Pt also had pulmonary rehab at [facility].   Current Medications  Current facility-administered medications:acetaminophen (TYLENOL) suppository 650 mg, 650 mg, Rectal, Q6H PRN, Mcarthur Rossetti, MD, 650 mg at 07/30/14 1003;  acetaminophen (TYLENOL) tablet 650 mg, 650  mg, Oral, Q6H PRN, Mcarthur Rossetti, MD, 650 mg at 07/30/14 1616;  ALPRAZolam Duanne Moron) tablet 1 mg, 1 mg, Oral, QHS, Verlee Monte, MD, 1 mg at 08/02/14 2218 aspirin EC tablet 81 mg, 81 mg, Oral, Daily, Verlee Monte, MD, 81 mg at 08/03/14 1020;  DULoxetine (CYMBALTA) DR capsule 30 mg, 30 mg, Oral, QHS, Verlee Monte, MD, 30 mg at 08/02/14 2217;  DULoxetine (CYMBALTA) DR capsule 60 mg, 60 mg, Oral, Daily, Verlee Monte, MD, 60 mg at 08/03/14 1019;  enoxaparin (LOVENOX) injection 40 mg, 40 mg, Subcutaneous, Q24H, Mcarthur Rossetti, MD, 40 mg at 08/02/14 4098 epoprostenol 4,500 mcg in glycine diluent for flolan 100 mL (45 mcg/mL) infusion, 33.45 ng/kg/min (Order-Specific), Intravenous, Continuous, Georgina Peer, [facility], Last Rate: 3.17 mL/hr at 07/28/14 1855, 33.45 ng/kg/min at 07/28/14 1855;  ezetimibe-simvastatin (VYTORIN) 10-40 MG per tablet 1 tablet, 1 tablet, Oral, QHS, Bobby Rumpf York, PA-C, 1 tablet at 08/02/14 2218 furosemide (LASIX) tablet 80 mg, 80 mg, Oral, Daily, Amy D Clegg, NP, 80 mg at 08/03/14 1019;  hydrALAZINE (APRESOLINE) injection 10 mg, 10 mg, Intravenous, Q6H PRN, Bobby Rumpf York, PA-C;  HYDROmorphone (DILAUDID) injection 1 mg, 1 mg, Intravenous, Q3H PRN, Domenic Polite, MD, 1 mg at 08/03/14 0835;  HYDROmorphone (DILAUDID) tablet 4 mg, 4 mg, Oral, BID, Domenic Polite, MD, 4 mg at 08/03/14 1019 ice pack MISC 1 each, 1 each, Other, 4 times per day, Verlee Monte, MD, 1 each at 08/03/14 0600;  insulin aspart (novoLOG) injection 0-15 Units, 0-15 Units, Subcutaneous, TID WC, Wilhelmina Mcardle, MD, 5 Units at 08/03/14 1153;  insulin aspart (novoLOG) injection 0-5 Units, 0-5 Units, Subcutaneous, QHS, Wilhelmina Mcardle, MD, 3 Units at 07/30/14 2136 insulin glargine (LANTUS) injection 8 Units, 8 Units, Subcutaneous, QHS, Domenic Polite, MD, 8 Units at 08/02/14 2218;  isosorbide mononitrate (IMDUR) 24 hr tablet 90 mg, 90 mg, Oral, q morning - 10a, Verlee Monte, MD, 90 mg at 08/03/14 1020;   levothyroxine (SYNTHROID, LEVOTHROID) tablet 25 mcg, 25 mcg, Oral, QAC breakfast, Verlee Monte, MD, 25 mcg at 08/03/14 1191 menthol-cetylpyridinium (CEPACOL) lozenge 3 mg, 1 lozenge, Oral, PRN, Mcarthur Rossetti, MD;  metoprolol (LOPRESSOR) injection 2.5-5 mg, 2.5-5 mg, Intravenous, Q3H PRN, Wilhelmina Mcardle, MD, 2.5 mg at 07/30/14 1016;  metoprolol succinate (TOPROL-XL) 24 hr tablet 100 mg, 100 mg, Oral, Daily, Verlee Monte, MD, 100 mg at 08/03/14 1020;  nitroGLYCERIN (NITROSTAT) SL tablet 0.4 mg, 0.4 mg, Sublingual, Q5 min PRN, Verlee Monte, MD ondansetron (ZOFRAN) injection 4 mg, 4 mg, Intravenous, Q6H PRN, Mcarthur Rossetti, MD, 4 mg at 08/01/14 1245;  ondansetron (ZOFRAN) tablet 4 mg, 4 mg, Oral, Q6H PRN, Mcarthur Rossetti, MD;  ondansetron [facility]) tablet 8 mg, 8 mg, Oral, BID, Mutaz  Elmahi, MD, 8 mg at 08/03/14 1020;  oxyCODONE-acetaminophen (PERCOCET/ROXICET) 5-325 MG per tablet 1 tablet, 1 tablet, Oral, Q4H PRN, Rush Farmer, MD, 1 tablet at 08/03/14 1356 pantoprazole (PROTONIX) EC tablet 40 mg, 40 mg, Oral, Q1200, Wilhelmina Mcardle, MD, 40 mg at 08/03/14 1153;  phenol (CHLORASEPTIC) mouth spray 1 spray, 1 spray, Mouth/Throat, PRN, Mcarthur Rossetti, MD;  potassium chloride SA (K-DUR,KLOR-CON) CR tablet 40 mEq, 40 mEq, Oral, Daily, Melton Alar, PA-C, 40 mEq at 08/03/14 1020;  sodium chloride (OCEAN) 0.65 % nasal spray 1 spray, 1 spray, Each Nare, PRN, Jonetta Osgood, MD vancomycin (VANCOCIN) 50 mg/mL oral solution 125 mg, 125 mg, Oral, 4 times per day, Domenic Polite, MD, 125 mg at 08/03/14 1153  Patients Current Diet:  heart healthy, carb modified  Precautions / Restrictions Precautions Precautions: Anterior Hip Precaution Booklet Issued: No Restrictions Weight Bearing Restrictions: No RUE Weight Bearing: Weight bearing as tolerated LUE Weight Bearing: Weight bearing as tolerated RLE Weight Bearing: Weight bearing as tolerated LLE Weight Bearing: Weight bearing as  tolerated   Prior Activity Level Household: Pt mainly stayed at home and has not driven in over a year (per her personal choice). Husband runs the errands and she got out on limited apporintments. Pt hasn't been feeling well since her last hospital admit in [place] / [place] Devices/Equipment: Gilford Rile (specify type);Cane (specify quad or straight) Home Equipment: Walker - 4 wheels;Cane - single point;Shower seat - built in;Bedside commode;Electric scooter;Grab bars - tub/shower  Prior Functional Level Prior Function Level of Independence: Independent Comments: Husband assists with bathing due to pump for pulmonary hypertension. Pt has not driven in a year. Can go out in the community when she feels good. On bad days she stays at home in bed mostly.   Current Functional Level Cognition  Overall Cognitive Status: Impaired/Different from baseline Current Attention Level: Selective Orientation Level: Oriented X4 Safety/Judgement: Decreased awareness of safety;Decreased awareness of deficits General Comments: Pt appears to have impaired cognition and unknown whether this is baseline deficit or acute.     Extremity Assessment (includes Sensation/Coordination)          ADLs  Overall ADL's : Needs assistance/impaired Grooming: Set up;Supervision/safety;Sitting Grooming Details (indicate cue type and reason): Next session will focus on using Steady with pt at sink for grooming activities to address standing tolerance.  Upper Body Bathing: Moderate assistance;Sitting Lower Body Bathing: Total assistance;Sit to/from stand Upper Body Dressing : Moderate assistance;Sitting Lower Body Dressing: Total assistance;Sit to/from stand Toilet Transfer: +2 for physical assistance;Total assistance;BSC Toileting- Clothing Manipulation and Hygiene: +2 for physical assistance;Sit to/from stand (+3 required to clean bottom in standing.) Functional mobility during  ADLs: +2 for physical assistance;Total assistance;Rolling walker;Cueing for sequencing;Cueing for safety General ADL Comments: OT observed pt with PT. Pt with improved function with use of steady and would benefit from it's use during ADLs at the sink and for transfers to [facility] (rather than bed pan). Discussed this with pt and husband and will notify RN. Following PT session, pt participated in UE strengthening exercises in sitting.     Mobility  Overal bed mobility: Needs Assistance Bed Mobility: Supine to Sit Supine to sit: +2 for physical assistance;Max assist Sit to supine: Max assist;+2 for physical assistance General bed mobility comments: Assist to bring legs over, elevate trunk into sitting and bringing hips to EOB.    Transfers  Overall transfer level: Needs assistance Equipment used: Rolling walker (2 wheeled);Ambulation equipment used (  Stedy) Transfers: Sit to/from [employer] to Stand: +2 physical assistance;Mod assist Stand pivot transfers: +2 physical assistance;Mod assist General transfer comment: Observed pt with PT. Pt with improved mobility using Stedy and would benefit from further use with therapists and RN.     Ambulation / Gait / Stairs / Wheelchair Mobility  Ambulation/Gait Ambulation/Gait assistance: Mod assist;+2 physical assistance Ambulation Distance (Feet): 3 Feet Assistive device: Rolling walker (2 wheeled) Gait Pattern/deviations: Step-to pattern;Decreased step length - left;Decreased stance time - right;Trunk flexed;Antalgic Gait velocity interpretation: Below normal speed for age/gender General Gait Details: Pt with difficulty stepping with LLE due to inability to bear weight on RLE. Verbal cues to put weight through arms onto walker.    Posture / Balance Dynamic Sitting Balance Sitting balance - Comments: Posterior and left lean requiring assist even with BUE support.    Special needs/care consideration BiPAP/CPAP no  CPM no   Continuous Drip IV no  Dialysis no         Life Vest no  Oxygen - currently on 6L O2 and wears 6L home O2 at baseline Special Bed no  Trach Size no  Wound Vac (area) no       Skin - pt with current R hip incision (bandaged) and with R chest port                               Bowel mgmt: last BM on 08-02-14, on contact precautions for enteric precautions. Some diarrhea at times. Bladder mgmt: currently using bedpan Diabetic mgmt - managed at home with insulin  Note: pt with continuous medication catheter/pump to port with Flolan medicine (for pulmonary hypertension). Husband knows how to change this and refill the Flolan when needed. Husband to refill the pump later on 08-03-14.   Previous Home Environment Living Arrangements: Spouse/significant other Available Help at Discharge: Family;Available 24 hours/day Type of Home: House Home Layout: One level Home Access: Stairs to enter Entrance Stairs-Rails: None Entrance Stairs-Number of Steps: 2 Bathroom Shower/Tub: Multimedia programmer: Standard Bathroom Accessibility: Yes How Accessible: Accessible via walker Home Care Services: No  Discharge Living Setting Plans for Discharge Living Setting: Patient's home Type of Home at Discharge: House Discharge Home Layout: One level Discharge Home Access: Stairs to enter Entrance Stairs-Rails:  (no rails at front, bil. rails at back door) Entrance Stairs-Number of Steps: 2 STE at front door, 8 STE at back door to back porch Does the patient have any problems obtaining your medications?: No  Social/Family/Support Systems Patient Roles: Spouse Contact Information: husband Fritz Pickerel is primary contact Anticipated Caregiver: husband or sisters Anticipated Caregiver's Contact Information: see above Ability/Limitations of Caregiver: no limitations from husband. Sisters can also be with pt if husband has to go out. Caregiver Availability: 24/7 Discharge Plan Discussed with Primary  Caregiver: Yes Is Caregiver In Agreement with Plan?: Yes Does Caregiver/Family have Issues with Lodging/Transportation while Pt is in Rehab?: No  Goals/Additional Needs Patient/Family Goal for Rehab: Supervision and Min Assist with PT/OT; NA for SLP Expected length of stay: 10-14 days Cultural Considerations: none Dietary Needs: heart healthy, carb modified Equipment Needs: to be determined Pt/Family Agrees to Admission and willing to participate: Yes (spoke with pt and her husband) Program Orientation Provided & Reviewed with Pt/Caregiver Including Roles  & Responsibilities: Yes   Decrease burden of Care through IP rehab admission: NA   Possible need for SNF placement upon discharge: not anticipated  Patient Condition: This patient's condition remains as documented in the consult dated 08-02-14, in which the Rehabilitation Physician determined and documented that the patient's condition is appropriate for intensive rehabilitative care in an inpatient rehabilitation facility. Will admit to inpatient rehab today.  Preadmission Screen Completed By:  Nanetta Batty, PT, 08/03/2014 2:00 PM ______________________________________________________________________   Discussed status with Dr. Naaman Plummer on 08/03/14 at 1400 and received telephone approval for admission today.  Admission Coordinator:  Nanetta Batty, PT, time 1400/Date10/13/15

## 2014-08-03 NOTE — Progress Notes (Addendum)
Subjective:   POD #5  Denies dyspnea or CP. Remains anxious. Being treated for c.diff. Got 1u RBC yesterday for anemia. Brief 6 beat run NSVT. Mag 1.8. No BMET this am.    Intake/Output Summary (Last 24 hours) at 08/03/14 0849 Last data filed at 08/03/14 0300  Gross per 24 hour  Intake    670 ml  Output    456 ml  Net    214 ml    Current meds: . ALPRAZolam  1 mg Oral QHS  . aspirin EC  81 mg Oral Daily  . DULoxetine  30 mg Oral QHS  . DULoxetine  60 mg Oral Daily  . enoxaparin (LOVENOX) injection  40 mg Subcutaneous Q24H  . ezetimibe-simvastatin  1 tablet Oral QHS  . furosemide  80 mg Oral Daily  . HYDROmorphone  4 mg Oral BID  . ice pack  1 each Other 4 times per day  . insulin aspart  0-15 Units Subcutaneous TID WC  . insulin aspart  0-5 Units Subcutaneous QHS  . insulin glargine  8 Units Subcutaneous QHS  . isosorbide mononitrate  90 mg Oral q morning - 10a  . levothyroxine  25 mcg Oral QAC breakfast  . metoprolol succinate  100 mg Oral Daily  . ondansetron  8 mg Oral BID  . pantoprazole  40 mg Oral Q1200  . potassium chloride SA  40 mEq Oral Daily  . vancomycin  125 mg Oral 4 times per day   Infusions: . epoprostenol 33.45 ng/kg/min (07/28/14 1855)     Objective:  Blood pressure 126/53, pulse 112, temperature 99.2 F (37.3 C), temperature source Oral, resp. rate 18, height 5' (1.524 m), weight 66.2 kg (145 lb 15.1 oz), SpO2 100.00%. Weight change: -0.8 kg (-1 lb 12.2 oz) Physical Exam:  General: Chronically-ill appearing. Lying flat in bed Wearing [place] O2 HEENT: normal  Neck: thick . JVP ~5 Carotids 2+ bilat; no bruits. No lymphadenopathy or thryomegaly appreciated.  Cor: PMI nonpalpable Regular tachy. 2/6 TR. P2 perhaps mildly accentuated but not severely so  Hickman catheter ok  Lungs: clear but decreased throughout no wheezing  Abdomen: obese. soft, nontender, nondistended. No hepatosplenomegaly. No bruits or masses. hypoactivends.  Extremities: no  cyanosis, clubbing  Neuro: alert & oriented x 3, cranial nerves grossly intact. moves all 4 extremities w/o difficulty. Affect pleasant.    Telemetry: SR 100-110  Lab Results: Basic Metabolic Panel:  Recent Labs Lab 07/29/14 0520 07/29/14 1246 07/30/14 0217 07/31/14 0216 08/01/14 0332 08/02/14 0412 08/03/14 0505  NA 136* 134* 138 134* 130* 132*  --   K 4.6 4.5 4.2 3.5* 3.6* 3.9  --   CL 98  --  95* 91* 88* 91*  --   CO2 29  --  29 29 28 29   --   GLUCOSE 84 118* 115* 139* 165* 146*  --   BUN 11  --  14 17 18 18   --   CREATININE 0.94  --  1.01 0.98 0.90 0.90  --   CALCIUM 8.4  --  8.6 8.5 8.4 8.5  --   MG  --   --   --   --   --   --  1.8   Liver Function Tests: No results found for this basename: AST, ALT, ALKPHOS, BILITOT, PROT, ALBUMIN,  in the last 168 hours No results found for this basename: LIPASE, AMYLASE,  in the last 168 hours No results found for this basename: AMMONIA,  in the  last 168 hours CBC:  Recent Labs Lab 07/27/14 1530  07/30/14 0217 07/31/14 0216 08/01/14 0332 08/02/14 0412 08/02/14 1919  WBC 20.9*  < > 23.9* 25.9* 22.9* 19.1* 20.7*  NEUTROABS 19.0*  --   --   --   --   --   --   HGB 10.4*  < > 9.6* 8.4* 7.4* 7.4* 8.9*  HCT 32.4*  < > 28.6* 24.8* 21.7* 21.9* 26.6*  MCV 86.2  < > 83.4 82.4 83.1 83.3 83.4  PLT 307  < > 251 268 286 264 249  < > = values in this interval not displayed. Cardiac Enzymes: No results found for this basename: CKTOTAL, CKMB, CKMBINDEX, TROPONINI,  in the last 168 hours BNP: No components found with this basename: POCBNP,  CBG:  Recent Labs Lab 08/02/14 0545 08/02/14 1112 08/02/14 1545 08/02/14 2139 08/03/14 0551  GLUCAP 181* 214* 189* 135* 139*   Microbiology: Lab Results  Component Value Date   CULT  Value: NO GROWTH Performed at [facility] 06/03/2014   CULT NO GROWTH 5 DAYS 06/03/2014   CULT NO GROWTH 5 DAYS 06/03/2014   CULT NO GROWTH 3 DAYS 05/14/2012   CULT NO GROWTH 5 DAYS 05/02/2012   No  results found for this basename: CULT, SDES,  in the last 168 hours  Imaging: Dg Chest Port 1v Same Day  08/01/2014   CLINICAL DATA:  Four days post right hip arthroplasty. Leukocytosis. Prior CABG. Acute respiratory failure this admission, now extubated.  EXAM: PORTABLE CHEST - 1 VIEW SAME DAY  COMPARISON:  Portable chest x-rays 07/29/2014, 07/27/2014.  FINDINGS: Extubation. Sternotomy for CABG. Cardiac silhouette moderately enlarged. Right coronary stem. Pulmonary venous hypertension without overt edema. Interval resolution of the patchy airspace opacities in the right upper lobe. New linear atelectasis scattered throughout both lungs. More focal consolidation in the retrocardiac left lower lobe. Possible small bilateral pleural effusions. Right jugular Hickman catheter tip projects over the upper right atrium.  IMPRESSION: 1. Resolution of right upper lobe pneumonia since the examination 3 days ago. 2. New streaky atelectasis or bronchopneumonia in the left lower lobe. 3. Linear atelectasis in both upper lobes.   Electronically Signed   By: Evangeline Dakin M.D.   On: 08/01/2014 16:21     ASSESSMENT:  1. Hip fracture s/p repair 10/8 2. Severe PAH on flolan  3. CAD s/p CABG. LAD lesion on last cath does not appear to be flow limiting  4. Chronic Diastolic HF  5. H/O Of GI bleed from esophagitis  6. Hypokalemia  7. Diarrhea H/O C diff  8. Hypothyroid  9. DM  10. C. Diff + 11. NSVT 12. Hypomagnesemia   PLAN/DISCUSSION:  Stable POD#5.  Back on po lasix. Continue Flolan. HGb improved after transfusion. Brief NSVT overnight. Will supp mag (goal > 2.0) and check K+ (goal >= 4.0). Ok to go to [employer] from our standpoint.     LOS: 7 days  Glori Bickers, MD 08/03/2014, 8:49 AM

## 2014-08-03 NOTE — H&P (View-Only) (Signed)
Physical Medicine and Rehabilitation Admission H&P  Chief Complaint   Patient presents with   .  Fall   .  right hip pain   :  HPI: Meagan [name] is a 68 y.o. right-handed female with history of systolic congestive heart failure, severe PAH(pulmonary arterial hypertension) maintained on continuous flofan infusion, fibromyalgia, COPD with chronic oxygen of 6 L, CAD with CABG and diabetes mellitus with peripheral neuropathy. Independent prior to admission living with her husband and used a walker outside of the home. Presented 07/27/2014 after mechanical fall. Denies loss of consciousness or associated chest pain or shortness or breath associated with fall. X-rays and imaging revealed a right hip displaced and angulated femoral neck fracture. Cardiology followup preoperatively and cleared for surgery. Underwent right total hip arthroplasty 07/29/2014 per Dr. Ninfa Linden. Weightbearing as tolerated with anterior hip precautions. Hospital course pain management. Subcutaneous Lovenox for DVT prophylaxis. Acute blood loss anemia and transfuse 1 unit packed red blood cells with latest hemoglobin 8.9 and monitored. Patient with history of recurrent Clostridium difficile maintained on contact precautions as well as vancomycin 125 mg 4 times a day x14 days initiated 08/01/2014.Marland Kitchen Physical and occupational therapy evaluations completed with recommendations for physical medicine rehabilitation consult. Patient was admitted for comprehensive rehabilitation program  ROS Review of Systems  Respiratory: Positive for shortness of breath.  Cardiovascular: Positive for leg swelling.  Gastrointestinal:  GERD  Musculoskeletal: Positive for joint pain and myalgias.  Psychiatric/Behavioral:  Anxiety  All other systems reviewed and are negative  Past Medical History   Diagnosis  Date   .  Fibromyalgia    .  Hypercholesterolemia    .  Hypertension      pulmonary pap 110/31 (mean 62)by [facility] 9/10 negative PE by chest CT  2010   .  Pulmonary edema      Failed Revatio   .  ARF (acute renal failure)      poor candidate for ACE -I or ARB   .  Restrictive lung disease      obesity hypoventilation syndrome   .  Renal insufficiency    .  CAD (coronary artery disease)      multivessel DES x 2 RCA 2007   .  Chronic diastolic heart failure    .  MRSA (methicillin resistant staphylococcus aureus) pneumonia    .  Pulmonary hypertension    .  Chronic anemia      anemia of chronic disease based on anemia panel 2011   .  Pancreatic mass  08/01/2012   .  PONV (postoperative nausea and vomiting)    .  Heart murmur      "when I was small"   .  CHF (congestive heart failure)    .  Anginal pain    .  Pneumonia  X 3   .  Type II diabetes mellitus    .  Hypothyroidism    .  H/O hiatal hernia    .  GERD (gastroesophageal reflux disease)    .  Anxiety    .  On home oxygen therapy      "6L all the time" (02/24/2014    Past Surgical History   Procedure  Laterality  Date   .  Coronary artery bypass graft   1997     LIMA to LAD,SVG to OM   .  Esophagogastroduodenoscopy   06/28/10     schatzki ring otherwise normal;small hiatal hernia   .  Coronary angioplasty   1993   .  Coronary angioplasty with stent placement   ~ 1996; 2008     "think I have 3 stents"   .  Abdominal hysterectomy   ~ 1986   .  Dilation and curettage of uterus   ~ 1984   .  Tubal ligation     .  Esophagogastroduodenoscopy  N/A  06/06/2014     Procedure: ESOPHAGOGASTRODUODENOSCOPY (EGD); Surgeon: Lafayette Dragon, MD; Location: [facility] ENDOSCOPY; Service: Endoscopy; Laterality: N/A;   .  Hip arthroplasty  Right  07/29/2014     Procedure: ARTHROPLASTY BIPOLAR HIP; Surgeon: Mcarthur Rossetti, MD; Location: [place]; Service: Orthopedics; Laterality: Right;    Family History   Problem  Relation  Age of Onset   .  Cancer  Mother      oral   .  Heart attack  Father    .  COPD  Brother    .  Heart disease  Brother     Social History: reports that she has  never smoked. She has never used smokeless tobacco. She reports that she does not drink alcohol or use illicit drugs.  Allergies:  Allergies   Allergen  Reactions   .  Aspirin      REACTION: Intolerance   .  Codeine  Other (See Comments)     Messes with digestive system   .  Niacin  Other (See Comments)     Pins sticking into skin   .  Nsaids  Other (See Comments)     REACTION: Aggravates Digestive System   .  Phenergan Fortis [Promethazine]  Other (See Comments)     Family and pt states becomes very confused and wild.   .  Sulfonamide Derivatives  Other (See Comments)     Messes with digestive system    Medications Prior to Admission   Medication  Sig  Dispense  Refill   .  ALPRAZolam (XANAX) 0.5 MG tablet  Take 1 mg by mouth at bedtime. For sleep. *Prescribed one tablet three times daily as needed*     .  aspirin EC 81 MG EC tablet  Take 1 tablet (81 mg total) by mouth daily.  30 tablet  0   .  DULoxetine (CYMBALTA) 30 MG capsule  Take 30 mg by mouth at bedtime.     .  DULoxetine (CYMBALTA) 60 MG capsule  Take 60 mg by mouth every morning.     Marland Kitchen  Epoprostenol Sodium (FLOLAN IV)  Inject into the vein. 1.5 mg. Inject into the vein continuously. Infuse mg/kg/min via cont IV pump.     Marland Kitchen  ezetimibe-simvastatin (VYTORIN) 10-40 MG per tablet  Take 1 tablet by mouth every evening.     .  furosemide (LASIX) 40 MG tablet  Take 80 mg by mouth daily.     Marland Kitchen  HYDROmorphone (DILAUDID) 2 MG tablet  Take 4 mg by mouth 2 (two) times daily.     .  insulin glargine (LANTUS) 100 UNIT/ML injection  Inject 0.12 mLs (12 Units total) into the skin every morning.     .  isosorbide mononitrate (IMDUR) 60 MG 24 hr tablet  Take 90 mg by mouth every morning.     Marland Kitchen  levothyroxine (SYNTHROID, LEVOTHROID) 25 MCG tablet  Take 25 mcg by mouth every morning.     .  metoprolol (TOPROL-XL) 100 MG 24 hr tablet  Take 100 mg by mouth every morning.     Marland Kitchen  omeprazole (PRILOSEC) 20 MG capsule  Take 20 mg  by mouth 2 (two)  times daily.     .  ondansetron (ZOFRAN) 8 MG tablet  Take 8 mg by mouth 2 (two) times daily. For nausea     .  Oxygen Permeable Lens Products SOLN  Inhale into the lungs continuous. 6 liters     .  Oxymetazoline HCl (NASAL SPRAY) 0.05 % SOLN  Place 1 spray into the nose as needed (for congestion).     .  potassium chloride SA (K-DUR,KLOR-CON) 20 MEQ tablet  Take 40 mEq by mouth daily.     .  Probiotic Product (FLORAJEN3 PO)  Take 1 tablet by mouth daily.     .  traMADol (ULTRAM) 50 MG tablet  Take 50 mg by mouth every 3 (three) hours as needed. For pain. *takes with Dilaudid (Hydromorphone)     .  VYTORIN 10-40 MG per tablet  TAKE ONE TABLET BY MOUTH AT BEDTIME  90 tablet  3   .  nitroGLYCERIN (NITROSTAT) 0.4 MG SL tablet  Place 0.4 mg under the tongue every 5 (five) minutes as needed for chest pain.      Home:  Home Living  Family/patient expects to be discharged to:: Private residence  Living Arrangements: Spouse/significant other  Available Help at Discharge: Family;Available 24 hours/day  Type of Home: House  Home Access: Stairs to enter  [employer] of Steps: 2  Entrance Stairs-Rails: None  Home Layout: One level  Home Equipment: Walker - 4 wheels;Cane - single point;Shower seat - built in;Bedside commode;Electric scooter;Grab bars - tub/shower  Functional History:  Prior Function  Level of Independence: Independent  Comments: Husband assists with bathing due to pump for pulmonary hypertension. Pt has not driven in a year. Can go out in the community when she feels good. On bad days she stays at home in bed mostly.  Functional Status:  Mobility:  Bed Mobility  Overal bed mobility: Needs Assistance  Bed Mobility: Supine to Sit  Supine to sit: +2 for physical assistance;Max assist  Sit to supine: Max assist;+2 for physical assistance  General bed mobility comments: Assist to bring legs over, elevate trunk into sitting and bringing hips to EOB.  Transfers  Overall  transfer level: Needs assistance  Equipment used: Rolling walker (2 wheeled);Ambulation equipment used Charlaine Dalton)  Transfers: Sit to/from [facility]  Sit to Stand: +2 physical assistance;Mod assist  Stand pivot transfers: +2 physical assistance;Mod assist  General transfer comment: Observed pt with PT. Pt with improved mobility using Stedy and would benefit from further use with therapists and RN.  Ambulation/Gait  Ambulation/Gait assistance: Mod assist;+2 physical assistance  Ambulation Distance (Feet): 3 Feet  Assistive device: Rolling walker (2 wheeled)  Gait Pattern/deviations: Step-to pattern;Decreased step length - left;Decreased stance time - right;Trunk flexed;Antalgic  Gait velocity interpretation: Below normal speed for age/gender  General Gait Details: Pt with difficulty stepping with LLE due to inability to bear weight on RLE. Verbal cues to put weight through arms onto walker.   ADL:  ADL  Overall ADL's : Needs assistance/impaired  Grooming: Set up;Supervision/safety;Sitting  Grooming Details (indicate cue type and reason): Next session will focus on using Steady with pt at sink for grooming activities to address standing tolerance.  Upper Body Bathing: Moderate assistance;Sitting  Lower Body Bathing: Total assistance;Sit to/from stand  Upper Body Dressing : Moderate assistance;Sitting  Lower Body Dressing: Total assistance;Sit to/from stand  Toilet Transfer: +2 for physical assistance;Total assistance;BSC  Toileting- Clothing Manipulation and Hygiene: +2 for physical assistance;Sit to/from stand (+  3 required to clean bottom in standing.)  Functional mobility during ADLs: +2 for physical assistance;Total assistance;Rolling walker;Cueing for sequencing;Cueing for safety  General ADL Comments: OT observed pt with PT. Pt with improved function with use of steady and would benefit from it's use during ADLs at the sink and for transfers to [facility] (rather than bed pan).  Discussed this with pt and husband and will notify RN. Following PT session, pt participated in UE strengthening exercises in sitting.  Cognition:  Cognition  Overall Cognitive Status: Impaired/Different from baseline  Orientation Level: Oriented X4  Cognition  Arousal/Alertness: Awake/alert  Behavior During Therapy: Flat affect  Overall Cognitive Status: Impaired/Different from baseline  Area of Impairment: Memory;Safety/judgement;Awareness;Problem solving;Attention  Current Attention Level: Selective  Memory: Decreased short-term memory;Decreased recall of precautions  Safety/Judgement: Decreased awareness of safety;Decreased awareness of deficits  Awareness: Intellectual  Problem Solving: Slow processing;Difficulty sequencing  General Comments: Pt appears to have impaired cognition and unknown whether this is baseline deficit or acute.  Physical Exam:  Blood pressure 126/53, pulse 112, temperature 99.2 F (37.3 C), temperature source Oral, resp. rate 18, height 5' (1.524 m), weight 66.2 kg (145 lb 15.1 oz), SpO2 100.00%.    Constitutional: She is oriented to person, place, and time. She appears well-developed.  HENT: oral mucosa moist, dentition fair Head: Normocephalic.  Eyes: EOM are normal.  Neck: Normal range of motion. Neck supple. No thyromegaly present.  Cardiovascular: Normal rate and regular rhythm.  Respiratory: Breath sounds normal. No respiratory distress.  GI: Soft. Bowel sounds are normal. She exhibits no distension.  Neurological: She is alert and oriented to person, place, and time. Fair insight and awareness.  Follows three-step commands. HOH sometimes limiting. UES: 4+ to 5/5. XYI:[id number] due to pain, 1/5 hf, 1+ KE and 4- ADF/APF. LLE grossly 3+ to 4+/5 prox to distal. No gross sensory findings.  Skin:  Hip incision clean and intact with staples.  M/S: right hip with mild edema. Area very sensitive to touch.  Psychiatric:  Affect flat, does not initiate  conversation, sometimes anxious Results for orders placed during the hospital encounter of 07/27/14 (from the past 48 hour(s))   CLOSTRIDIUM DIFFICILE BY PCR Status: Abnormal    Collection Time    08/01/14 11:41 AM   Result  Value  Ref Range    C difficile by pcr  POSITIVE (*)  NEGATIVE    Comment:  CRITICAL RESULT CALLED TO, READ BACK BY AND VERIFIED WITH:     ADRIENNE [facility] [place] [id number] [place], CAPILLARY Status: Abnormal    Collection Time    08/01/14 11:44 AM   Result  Value  Ref Range    Glucose-Capillary  205 (*)  70 - 99 mg/dL    Comment 1  Notify RN    GLUCOSE, CAPILLARY Status: Abnormal    Collection Time    08/01/14 4:20 PM   Result  Value  Ref Range    Glucose-Capillary  118 (*)  70 - 99 mg/dL    Comment 1  Notify RN    GLUCOSE, CAPILLARY Status: Abnormal    Collection Time    08/01/14 9:11 PM   Result  Value  Ref Range    Glucose-Capillary  185 (*)  70 - 99 mg/dL   CBC Status: Abnormal    Collection Time    08/02/14 4:12 AM   Result  Value  Ref Range    WBC  19.1 (*)  4.0 - 10.5 K/uL  RBC  2.63 (*)  3.87 - 5.11 MIL/uL    Hemoglobin  7.4 (*)  12.0 - 15.0 g/dL    HCT  21.9 (*)  36.0 - 46.0 %    MCV  83.3  78.0 - 100.0 fL    MCH  28.1  26.0 - 34.0 pg    MCHC  33.8  30.0 - 36.0 g/dL    RDW  13.8  11.5 - 15.5 %    Platelets  264  150 - 400 K/uL   BASIC METABOLIC PANEL Status: Abnormal    Collection Time    08/02/14 4:12 AM   Result  Value  Ref Range    Sodium  132 (*)  137 - 147 mEq/L    Potassium  3.9  3.7 - 5.3 mEq/L    Chloride  91 (*)  96 - 112 mEq/L    CO2  29  19 - 32 mEq/L    Glucose, Bld  146 (*)  70 - 99 mg/dL    BUN  18  6 - 23 mg/dL    Creatinine, Ser  0.90  0.50 - 1.10 mg/dL    Calcium  8.5  8.4 - 10.5 mg/dL    GFR calc non Af Amer  64 (*)  >90 mL/min    GFR calc Af Amer  74 (*)  >90 mL/min    Comment:  (NOTE)     The eGFR has been calculated using the CKD EPI equation.     This calculation has not been validated in all  clinical situations.     eGFR's persistently <90 mL/min signify possible Chronic Kidney     Disease.    Anion gap  12  5 - 15   GLUCOSE, CAPILLARY Status: Abnormal    Collection Time    08/02/14 5:45 AM   Result  Value  Ref Range    Glucose-Capillary  181 (*)  70 - 99 mg/dL    Comment 1  Notify RN    PREPARE RBC (CROSSMATCH) Status: None    Collection Time    08/02/14 9:51 AM   Result  Value  Ref Range    Order Confirmation  ORDER PROCESSED BY BLOOD BANK    TYPE AND SCREEN Status: None    Collection Time    08/02/14 9:51 AM   Result  Value  Ref Range    ABO/RH(D)  O POS     Antibody Screen  NEG     Sample Expiration  08/05/2014     Unit Number  [id number]     Blood Component Type  RBC LR PHER2     Unit division  00     Status of Unit  ISSUED     Transfusion Status  OK TO TRANSFUSE     Crossmatch Result  Compatible    GLUCOSE, CAPILLARY Status: Abnormal    Collection Time    08/02/14 11:12 AM   Result  Value  Ref Range    Glucose-Capillary  214 (*)  70 - 99 mg/dL   GLUCOSE, CAPILLARY Status: Abnormal    Collection Time    08/02/14 3:45 PM   Result  Value  Ref Range    Glucose-Capillary  189 (*)  70 - 99 mg/dL    Comment 1  Notify RN    CBC Status: Abnormal    Collection Time    08/02/14 7:19 PM   Result  Value  Ref Range    WBC  20.7 (*)  4.0 - 10.5 K/uL    RBC  3.19 (*)  3.87 - 5.11 MIL/uL    Hemoglobin  8.9 (*)  12.0 - 15.0 g/dL    Comment:  POST TRANSFUSION SPECIMEN    HCT  26.6 (*)  36.0 - 46.0 %    MCV  83.4  78.0 - 100.0 fL    MCH  27.9  26.0 - 34.0 pg    MCHC  33.5  30.0 - 36.0 g/dL    RDW  13.3  11.5 - 15.5 %    Platelets  249  150 - 400 K/uL   PROTIME-INR Status: None    Collection Time    08/02/14 7:19 PM   Result  Value  Ref Range    Prothrombin Time  13.5  11.6 - 15.2 seconds    INR  1.03  0.00 - 1.49   APTT Status: None    Collection Time    08/02/14 7:19 PM   Result  Value  Ref Range    aPTT  29  24 - 37 seconds   GLUCOSE, CAPILLARY  Status: Abnormal    Collection Time    08/02/14 9:39 PM   Result  Value  Ref Range    Glucose-Capillary  135 (*)  70 - 99 mg/dL   MAGNESIUM Status: None    Collection Time    08/03/14 5:05 AM   Result  Value  Ref Range    Magnesium  1.8  1.5 - 2.5 mg/dL   GLUCOSE, CAPILLARY Status: Abnormal    Collection Time    08/03/14 5:51 AM   Result  Value  Ref Range    Glucose-Capillary  139 (*)  70 - 99 mg/dL    Dg Chest Port 1v Same Day  08/01/2014 CLINICAL DATA: Four days post right hip arthroplasty. Leukocytosis. Prior CABG. Acute respiratory failure this admission, now extubated. EXAM: PORTABLE CHEST - 1 VIEW SAME DAY COMPARISON: Portable chest x-rays 07/29/2014, 07/27/2014. FINDINGS: Extubation. Sternotomy for CABG. Cardiac silhouette moderately enlarged. Right coronary stem. Pulmonary venous hypertension without overt edema. Interval resolution of the patchy airspace opacities in the right upper lobe. New linear atelectasis scattered throughout both lungs. More focal consolidation in the retrocardiac left lower lobe. Possible small bilateral pleural effusions. Right jugular Hickman catheter tip projects over the upper right atrium. IMPRESSION: 1. Resolution of right upper lobe pneumonia since the examination 3 days ago. 2. New streaky atelectasis or bronchopneumonia in the left lower lobe. 3. Linear atelectasis in both upper lobes. Electronically Signed By: Evangeline Dakin M.D. On: 08/01/2014 16:21   Medical Problem List and Plan:  1. Functional deficits secondary to displaced right femoral neck fracture. 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 Weightbearing as tolerated with anterior hip precautions  2. DVT Prophylaxis/Anticoagulation: Subcutaneous Lovenox. Monitor platelet counts and any signs of bleeding. Check vascular study  3. Pain Management: Oxycodone as needed. Monitor with increased mobility  4. Mood/anxiety/depression: Xanax 1 mg each bedtime. Provide emotional support  5.  Neuropsych: This patient is capable of making decisions on her own behalf.  6. Skin/Wound Care: Routine skin checks  7. Fluids/Electrolytes/Nutrition: Followup chemistries was strict I and nose. Provide nutritional supplements as needed  8. Severe pulmonary arterial hypertension. Continue Flolan per cardiology services as well as Toprol-XL 100 mg daily, Imdur 90 mg daily, Lasix 80 mg daily. Monitor with increased mobility  9. Diabetes mellitus with peripheral neuropathy. Latest hemoglobin A1c 6.3. Lantus insulin 8 units each bedtime. Check blood sugars a.c. and at  bedtime  10. Hypothyroidism. Continue Synthroid  11. Hyperlipidemia.Vytorin  12. Recurrent Clostridium difficile. Contact precautions. Vancomycin 125 mg 4 times a day x14 days initiated 08/01/2014  13. COPD/chronic respiratory failure. Oxygen therapy as directed  Post Admission Physician Evaluation:  1. Functional deficits secondary to displaced right femoral neck fracture s/p right THA 07/29/14. 2. Patient is admitted to receive collaborative, interdisciplinary care between the physiatrist, rehab nursing staff, and therapy team. 3. Patient's level of medical complexity and substantial therapy needs in context of that medical necessity cannot be provided at a lesser intensity of care such as a SNF. 4. Patient has experienced substantial functional loss from his/her baseline which was documented above under the "Functional History" and "Functional Status" headings. Judging by the patient's diagnosis, physical exam, and functional history, the patient has potential for functional progress which will result in measurable gains while on inpatient rehab. These gains will be of substantial and practical use upon discharge in facilitating mobility and self-care at the household level. 5. Physiatrist will provide 24 hour management of medical needs as well as oversight of the therapy plan/treatment and provide guidance as appropriate regarding the  interaction of the two. 6. 24 hour rehab nursing will assist with bladder management, bowel management, safety, skin/wound care, disease management, medication administration, pain management and patient education and help integrate therapy concepts, techniques,education, etc. 7. PT will assess and treat for/with: Lower extremity strength, range of motion, stamina, balance, functional mobility, safety, adaptive techniques and equipment, pain mgt, hip precautions, wound mgt. Goals are: supervision to min assist. 8. OT will assess and treat for/with: ADL's, functional mobility, safety, upper extremity strength, adaptive techniques and equipment, pain mgt, hip precautions, wound care, ego support. Goals are: supervision to min assist. Therapy may not yet proceed with showering this patient. 9. SLP will assess and treat for/with: n/a. Goals are: n/a. 10. Case Management and Social Worker will assess and treat for psychological issues and discharge planning. 11. Team conference will be held weekly to assess progress toward goals and to determine barriers to discharge. 12. Patient will receive at least 3 hours of therapy per day at least 5 days per week. 13. ELOS: 13-17 days  14. Prognosis: excellent  Meredith Staggers, MD, [place] [facility] 08/03/2014

## 2014-08-03 NOTE — H&P (Signed)
Physical Medicine and Rehabilitation Admission H&P  Chief Complaint   Patient presents with   .  Fall   .  right hip pain   :  HPI: Meagan [name] is a 68 y.o. right-handed female with history of systolic congestive heart failure, severe PAH(pulmonary arterial hypertension) maintained on continuous flofan infusion, fibromyalgia, COPD with chronic oxygen of 6 L, CAD with CABG and diabetes mellitus with peripheral neuropathy. Independent prior to admission living with her husband and used a walker outside of the home. Presented 07/27/2014 after mechanical fall. Denies loss of consciousness or associated chest pain or shortness or breath associated with fall. X-rays and imaging revealed a right hip displaced and angulated femoral neck fracture. Cardiology followup preoperatively and cleared for surgery. Underwent right total hip arthroplasty 07/29/2014 per Dr. Ninfa Linden. Weightbearing as tolerated with anterior hip precautions. Hospital course pain management. Subcutaneous Lovenox for DVT prophylaxis. Acute blood loss anemia and transfuse 1 unit packed red blood cells with latest hemoglobin 8.9 and monitored. Patient with history of recurrent Clostridium difficile maintained on contact precautions as well as vancomycin 125 mg 4 times a day x14 days initiated 08/01/2014.Marland Kitchen Physical and occupational therapy evaluations completed with recommendations for physical medicine rehabilitation consult. Patient was admitted for comprehensive rehabilitation program  ROS Review of Systems  Respiratory: Positive for shortness of breath.  Cardiovascular: Positive for leg swelling.  Gastrointestinal:  GERD  Musculoskeletal: Positive for joint pain and myalgias.  Psychiatric/Behavioral:  Anxiety  All other systems reviewed and are negative  Past Medical History   Diagnosis  Date   .  Fibromyalgia    .  Hypercholesterolemia    .  Hypertension      pulmonary pap 110/31 (mean 62)by [facility] 9/10 negative PE by chest CT  2010   .  Pulmonary edema      Failed Revatio   .  ARF (acute renal failure)      poor candidate for ACE -I or ARB   .  Restrictive lung disease      obesity hypoventilation syndrome   .  Renal insufficiency    .  CAD (coronary artery disease)      multivessel DES x 2 RCA 2007   .  Chronic diastolic heart failure    .  MRSA (methicillin resistant staphylococcus aureus) pneumonia    .  Pulmonary hypertension    .  Chronic anemia      anemia of chronic disease based on anemia panel 2011   .  Pancreatic mass  08/01/2012   .  PONV (postoperative nausea and vomiting)    .  Heart murmur      "when I was small"   .  CHF (congestive heart failure)    .  Anginal pain    .  Pneumonia  X 3   .  Type II diabetes mellitus    .  Hypothyroidism    .  H/O hiatal hernia    .  GERD (gastroesophageal reflux disease)    .  Anxiety    .  On home oxygen therapy      "6L all the time" (02/24/2014    Past Surgical History   Procedure  Laterality  Date   .  Coronary artery bypass graft   1997     LIMA to LAD,SVG to OM   .  Esophagogastroduodenoscopy   06/28/10     schatzki ring otherwise normal;small hiatal hernia   .  Coronary angioplasty   1993   .  Coronary angioplasty with stent placement   ~ 1996; 2008     "think I have 3 stents"   .  Abdominal hysterectomy   ~ 1986   .  Dilation and curettage of uterus   ~ 1984   .  Tubal ligation     .  Esophagogastroduodenoscopy  N/A  06/06/2014     Procedure: ESOPHAGOGASTRODUODENOSCOPY (EGD); Surgeon: Lafayette Dragon, MD; Location: [facility] ENDOSCOPY; Service: Endoscopy; Laterality: N/A;   .  Hip arthroplasty  Right  07/29/2014     Procedure: ARTHROPLASTY BIPOLAR HIP; Surgeon: Mcarthur Rossetti, MD; Location: [place]; Service: Orthopedics; Laterality: Right;    Family History   Problem  Relation  Age of Onset   .  Cancer  Mother      oral   .  Heart attack  Father    .  COPD  Brother    .  Heart disease  Brother     Social History: reports that she has  never smoked. She has never used smokeless tobacco. She reports that she does not drink alcohol or use illicit drugs.  Allergies:  Allergies   Allergen  Reactions   .  Aspirin      REACTION: Intolerance   .  Codeine  Other (See Comments)     Messes with digestive system   .  Niacin  Other (See Comments)     Pins sticking into skin   .  Nsaids  Other (See Comments)     REACTION: Aggravates Digestive System   .  Phenergan Fortis [Promethazine]  Other (See Comments)     Family and pt states becomes very confused and wild.   .  Sulfonamide Derivatives  Other (See Comments)     Messes with digestive system    Medications Prior to Admission   Medication  Sig  Dispense  Refill   .  ALPRAZolam (XANAX) 0.5 MG tablet  Take 1 mg by mouth at bedtime. For sleep. *Prescribed one tablet three times daily as needed*     .  aspirin EC 81 MG EC tablet  Take 1 tablet (81 mg total) by mouth daily.  30 tablet  0   .  DULoxetine (CYMBALTA) 30 MG capsule  Take 30 mg by mouth at bedtime.     .  DULoxetine (CYMBALTA) 60 MG capsule  Take 60 mg by mouth every morning.     Marland Kitchen  Epoprostenol Sodium (FLOLAN IV)  Inject into the vein. 1.5 mg. Inject into the vein continuously. Infuse mg/kg/min via cont IV pump.     Marland Kitchen  ezetimibe-simvastatin (VYTORIN) 10-40 MG per tablet  Take 1 tablet by mouth every evening.     .  furosemide (LASIX) 40 MG tablet  Take 80 mg by mouth daily.     Marland Kitchen  HYDROmorphone (DILAUDID) 2 MG tablet  Take 4 mg by mouth 2 (two) times daily.     .  insulin glargine (LANTUS) 100 UNIT/ML injection  Inject 0.12 mLs (12 Units total) into the skin every morning.     .  isosorbide mononitrate (IMDUR) 60 MG 24 hr tablet  Take 90 mg by mouth every morning.     Marland Kitchen  levothyroxine (SYNTHROID, LEVOTHROID) 25 MCG tablet  Take 25 mcg by mouth every morning.     .  metoprolol (TOPROL-XL) 100 MG 24 hr tablet  Take 100 mg by mouth every morning.     Marland Kitchen  omeprazole (PRILOSEC) 20 MG capsule  Take 20 mg  by mouth 2 (two)  times daily.     .  ondansetron (ZOFRAN) 8 MG tablet  Take 8 mg by mouth 2 (two) times daily. For nausea     .  Oxygen Permeable Lens Products SOLN  Inhale into the lungs continuous. 6 liters     .  Oxymetazoline HCl (NASAL SPRAY) 0.05 % SOLN  Place 1 spray into the nose as needed (for congestion).     .  potassium chloride SA (K-DUR,KLOR-CON) 20 MEQ tablet  Take 40 mEq by mouth daily.     .  Probiotic Product (FLORAJEN3 PO)  Take 1 tablet by mouth daily.     .  traMADol (ULTRAM) 50 MG tablet  Take 50 mg by mouth every 3 (three) hours as needed. For pain. *takes with Dilaudid (Hydromorphone)     .  VYTORIN 10-40 MG per tablet  TAKE ONE TABLET BY MOUTH AT BEDTIME  90 tablet  3   .  nitroGLYCERIN (NITROSTAT) 0.4 MG SL tablet  Place 0.4 mg under the tongue every 5 (five) minutes as needed for chest pain.      Home:  Home Living  Family/patient expects to be discharged to:: Private residence  Living Arrangements: Spouse/significant other  Available Help at Discharge: Family;Available 24 hours/day  Type of Home: House  Home Access: Stairs to enter  [employer] of Steps: 2  Entrance Stairs-Rails: None  Home Layout: One level  Home Equipment: Walker - 4 wheels;Cane - single point;Shower seat - built in;Bedside commode;Electric scooter;Grab bars - tub/shower  Functional History:  Prior Function  Level of Independence: Independent  Comments: Husband assists with bathing due to pump for pulmonary hypertension. Pt has not driven in a year. Can go out in the community when she feels good. On bad days she stays at home in bed mostly.  Functional Status:  Mobility:  Bed Mobility  Overal bed mobility: Needs Assistance  Bed Mobility: Supine to Sit  Supine to sit: +2 for physical assistance;Max assist  Sit to supine: Max assist;+2 for physical assistance  General bed mobility comments: Assist to bring legs over, elevate trunk into sitting and bringing hips to EOB.  Transfers  Overall  transfer level: Needs assistance  Equipment used: Rolling walker (2 wheeled);Ambulation equipment used Charlaine Dalton)  Transfers: Sit to/from [facility]  Sit to Stand: +2 physical assistance;Mod assist  Stand pivot transfers: +2 physical assistance;Mod assist  General transfer comment: Observed pt with PT. Pt with improved mobility using Stedy and would benefit from further use with therapists and RN.  Ambulation/Gait  Ambulation/Gait assistance: Mod assist;+2 physical assistance  Ambulation Distance (Feet): 3 Feet  Assistive device: Rolling walker (2 wheeled)  Gait Pattern/deviations: Step-to pattern;Decreased step length - left;Decreased stance time - right;Trunk flexed;Antalgic  Gait velocity interpretation: Below normal speed for age/gender  General Gait Details: Pt with difficulty stepping with LLE due to inability to bear weight on RLE. Verbal cues to put weight through arms onto walker.   ADL:  ADL  Overall ADL's : Needs assistance/impaired  Grooming: Set up;Supervision/safety;Sitting  Grooming Details (indicate cue type and reason): Next session will focus on using Steady with pt at sink for grooming activities to address standing tolerance.  Upper Body Bathing: Moderate assistance;Sitting  Lower Body Bathing: Total assistance;Sit to/from stand  Upper Body Dressing : Moderate assistance;Sitting  Lower Body Dressing: Total assistance;Sit to/from stand  Toilet Transfer: +2 for physical assistance;Total assistance;BSC  Toileting- Clothing Manipulation and Hygiene: +2 for physical assistance;Sit to/from stand (+  3 required to clean bottom in standing.)  Functional mobility during ADLs: +2 for physical assistance;Total assistance;Rolling walker;Cueing for sequencing;Cueing for safety  General ADL Comments: OT observed pt with PT. Pt with improved function with use of steady and would benefit from it's use during ADLs at the sink and for transfers to [facility] (rather than bed pan).  Discussed this with pt and husband and will notify RN. Following PT session, pt participated in UE strengthening exercises in sitting.  Cognition:  Cognition  Overall Cognitive Status: Impaired/Different from baseline  Orientation Level: Oriented X4  Cognition  Arousal/Alertness: Awake/alert  Behavior During Therapy: Flat affect  Overall Cognitive Status: Impaired/Different from baseline  Area of Impairment: Memory;Safety/judgement;Awareness;Problem solving;Attention  Current Attention Level: Selective  Memory: Decreased short-term memory;Decreased recall of precautions  Safety/Judgement: Decreased awareness of safety;Decreased awareness of deficits  Awareness: Intellectual  Problem Solving: Slow processing;Difficulty sequencing  General Comments: Pt appears to have impaired cognition and unknown whether this is baseline deficit or acute.  Physical Exam:  Blood pressure 126/53, pulse 112, temperature 99.2 F (37.3 C), temperature source Oral, resp. rate 18, height 5' (1.524 m), weight 66.2 kg (145 lb 15.1 oz), SpO2 100.00%.    Constitutional: She is oriented to person, place, and time. She appears well-developed.  HENT: oral mucosa moist, dentition fair Head: Normocephalic.  Eyes: EOM are normal.  Neck: Normal range of motion. Neck supple. No thyromegaly present.  Cardiovascular: Normal rate and regular rhythm.  Respiratory: Breath sounds normal. No respiratory distress.  GI: Soft. Bowel sounds are normal. She exhibits no distension.  Neurological: She is alert and oriented to person, place, and time. Fair insight and awareness.  Follows three-step commands. HOH sometimes limiting. UES: 4+ to 5/5. XYI:[id number] due to pain, 1/5 hf, 1+ KE and 4- ADF/APF. LLE grossly 3+ to 4+/5 prox to distal. No gross sensory findings.  Skin:  Hip incision clean and intact with staples.  M/S: right hip with mild edema. Area very sensitive to touch.  Psychiatric:  Affect flat, does not initiate  conversation, sometimes anxious Results for orders placed during the hospital encounter of 07/27/14 (from the past 48 hour(s))   CLOSTRIDIUM DIFFICILE BY PCR Status: Abnormal    Collection Time    08/01/14 11:41 AM   Result  Value  Ref Range    C difficile by pcr  POSITIVE (*)  NEGATIVE    Comment:  CRITICAL RESULT CALLED TO, READ BACK BY AND VERIFIED WITH:     ADRIENNE [facility] [place] [id number] [place], CAPILLARY Status: Abnormal    Collection Time    08/01/14 11:44 AM   Result  Value  Ref Range    Glucose-Capillary  205 (*)  70 - 99 mg/dL    Comment 1  Notify RN    GLUCOSE, CAPILLARY Status: Abnormal    Collection Time    08/01/14 4:20 PM   Result  Value  Ref Range    Glucose-Capillary  118 (*)  70 - 99 mg/dL    Comment 1  Notify RN    GLUCOSE, CAPILLARY Status: Abnormal    Collection Time    08/01/14 9:11 PM   Result  Value  Ref Range    Glucose-Capillary  185 (*)  70 - 99 mg/dL   CBC Status: Abnormal    Collection Time    08/02/14 4:12 AM   Result  Value  Ref Range    WBC  19.1 (*)  4.0 - 10.5 K/uL  RBC  2.63 (*)  3.87 - 5.11 MIL/uL    Hemoglobin  7.4 (*)  12.0 - 15.0 g/dL    HCT  21.9 (*)  36.0 - 46.0 %    MCV  83.3  78.0 - 100.0 fL    MCH  28.1  26.0 - 34.0 pg    MCHC  33.8  30.0 - 36.0 g/dL    RDW  13.8  11.5 - 15.5 %    Platelets  264  150 - 400 K/uL   BASIC METABOLIC PANEL Status: Abnormal    Collection Time    08/02/14 4:12 AM   Result  Value  Ref Range    Sodium  132 (*)  137 - 147 mEq/L    Potassium  3.9  3.7 - 5.3 mEq/L    Chloride  91 (*)  96 - 112 mEq/L    CO2  29  19 - 32 mEq/L    Glucose, Bld  146 (*)  70 - 99 mg/dL    BUN  18  6 - 23 mg/dL    Creatinine, Ser  0.90  0.50 - 1.10 mg/dL    Calcium  8.5  8.4 - 10.5 mg/dL    GFR calc non Af Amer  64 (*)  >90 mL/min    GFR calc Af Amer  74 (*)  >90 mL/min    Comment:  (NOTE)     The eGFR has been calculated using the CKD EPI equation.     This calculation has not been validated in all  clinical situations.     eGFR's persistently <90 mL/min signify possible Chronic Kidney     Disease.    Anion gap  12  5 - 15   GLUCOSE, CAPILLARY Status: Abnormal    Collection Time    08/02/14 5:45 AM   Result  Value  Ref Range    Glucose-Capillary  181 (*)  70 - 99 mg/dL    Comment 1  Notify RN    PREPARE RBC (CROSSMATCH) Status: None    Collection Time    08/02/14 9:51 AM   Result  Value  Ref Range    Order Confirmation  ORDER PROCESSED BY BLOOD BANK    TYPE AND SCREEN Status: None    Collection Time    08/02/14 9:51 AM   Result  Value  Ref Range    ABO/RH(D)  O POS     Antibody Screen  NEG     Sample Expiration  08/05/2014     Unit Number  [id number]     Blood Component Type  RBC LR PHER2     Unit division  00     Status of Unit  ISSUED     Transfusion Status  OK TO TRANSFUSE     Crossmatch Result  Compatible    GLUCOSE, CAPILLARY Status: Abnormal    Collection Time    08/02/14 11:12 AM   Result  Value  Ref Range    Glucose-Capillary  214 (*)  70 - 99 mg/dL   GLUCOSE, CAPILLARY Status: Abnormal    Collection Time    08/02/14 3:45 PM   Result  Value  Ref Range    Glucose-Capillary  189 (*)  70 - 99 mg/dL    Comment 1  Notify RN    CBC Status: Abnormal    Collection Time    08/02/14 7:19 PM   Result  Value  Ref Range    WBC  20.7 (*)  4.0 - 10.5 K/uL    RBC  3.19 (*)  3.87 - 5.11 MIL/uL    Hemoglobin  8.9 (*)  12.0 - 15.0 g/dL    Comment:  POST TRANSFUSION SPECIMEN    HCT  26.6 (*)  36.0 - 46.0 %    MCV  83.4  78.0 - 100.0 fL    MCH  27.9  26.0 - 34.0 pg    MCHC  33.5  30.0 - 36.0 g/dL    RDW  13.3  11.5 - 15.5 %    Platelets  249  150 - 400 K/uL   PROTIME-INR Status: None    Collection Time    08/02/14 7:19 PM   Result  Value  Ref Range    Prothrombin Time  13.5  11.6 - 15.2 seconds    INR  1.03  0.00 - 1.49   APTT Status: None    Collection Time    08/02/14 7:19 PM   Result  Value  Ref Range    aPTT  29  24 - 37 seconds   GLUCOSE, CAPILLARY  Status: Abnormal    Collection Time    08/02/14 9:39 PM   Result  Value  Ref Range    Glucose-Capillary  135 (*)  70 - 99 mg/dL   MAGNESIUM Status: None    Collection Time    08/03/14 5:05 AM   Result  Value  Ref Range    Magnesium  1.8  1.5 - 2.5 mg/dL   GLUCOSE, CAPILLARY Status: Abnormal    Collection Time    08/03/14 5:51 AM   Result  Value  Ref Range    Glucose-Capillary  139 (*)  70 - 99 mg/dL    Dg Chest Port 1v Same Day  08/01/2014 CLINICAL DATA: Four days post right hip arthroplasty. Leukocytosis. Prior CABG. Acute respiratory failure this admission, now extubated. EXAM: PORTABLE CHEST - 1 VIEW SAME DAY COMPARISON: Portable chest x-rays 07/29/2014, 07/27/2014. FINDINGS: Extubation. Sternotomy for CABG. Cardiac silhouette moderately enlarged. Right coronary stem. Pulmonary venous hypertension without overt edema. Interval resolution of the patchy airspace opacities in the right upper lobe. New linear atelectasis scattered throughout both lungs. More focal consolidation in the retrocardiac left lower lobe. Possible small bilateral pleural effusions. Right jugular Hickman catheter tip projects over the upper right atrium. IMPRESSION: 1. Resolution of right upper lobe pneumonia since the examination 3 days ago. 2. New streaky atelectasis or bronchopneumonia in the left lower lobe. 3. Linear atelectasis in both upper lobes. Electronically Signed By: Evangeline Dakin M.D. On: 08/01/2014 16:21   Medical Problem List and Plan:  1. Functional deficits secondary to displaced right femoral neck fracture. 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 Weightbearing as tolerated with anterior hip precautions  2. DVT Prophylaxis/Anticoagulation: Subcutaneous Lovenox. Monitor platelet counts and any signs of bleeding. Check vascular study  3. Pain Management: Oxycodone as needed. Monitor with increased mobility  4. Mood/anxiety/depression: Xanax 1 mg each bedtime. Provide emotional support  5.  Neuropsych: This patient is capable of making decisions on her own behalf.  6. Skin/Wound Care: Routine skin checks  7. Fluids/Electrolytes/Nutrition: Followup chemistries was strict I and nose. Provide nutritional supplements as needed  8. Severe pulmonary arterial hypertension. Continue Flolan per cardiology services as well as Toprol-XL 100 mg daily, Imdur 90 mg daily, Lasix 80 mg daily. Monitor with increased mobility  9. Diabetes mellitus with peripheral neuropathy. Latest hemoglobin A1c 6.3. Lantus insulin 8 units each bedtime. Check blood sugars a.c. and at  bedtime  10. Hypothyroidism. Continue Synthroid  11. Hyperlipidemia.Vytorin  12. Recurrent Clostridium difficile. Contact precautions. Vancomycin 125 mg 4 times a day x14 days initiated 08/01/2014  13. COPD/chronic respiratory failure. Oxygen therapy as directed  Post Admission Physician Evaluation:  1. Functional deficits secondary to displaced right femoral neck fracture s/p right THA 07/29/14. 2. Patient is admitted to receive collaborative, interdisciplinary care between the physiatrist, rehab nursing staff, and therapy team. 3. Patient's level of medical complexity and substantial therapy needs in context of that medical necessity cannot be provided at a lesser intensity of care such as a SNF. 4. Patient has experienced substantial functional loss from his/her baseline which was documented above under the "Functional History" and "Functional Status" headings. Judging by the patient's diagnosis, physical exam, and functional history, the patient has potential for functional progress which will result in measurable gains while on inpatient rehab. These gains will be of substantial and practical use upon discharge in facilitating mobility and self-care at the household level. 5. Physiatrist will provide 24 hour management of medical needs as well as oversight of the therapy plan/treatment and provide guidance as appropriate regarding the  interaction of the two. 6. 24 hour rehab nursing will assist with bladder management, bowel management, safety, skin/wound care, disease management, medication administration, pain management and patient education and help integrate therapy concepts, techniques,education, etc. 7. PT will assess and treat for/with: Lower extremity strength, range of motion, stamina, balance, functional mobility, safety, adaptive techniques and equipment, pain mgt, hip precautions, wound mgt. Goals are: supervision to min assist. 8. OT will assess and treat for/with: ADL's, functional mobility, safety, upper extremity strength, adaptive techniques and equipment, pain mgt, hip precautions, wound care, ego support. Goals are: supervision to min assist. Therapy may not yet proceed with showering this patient. 9. SLP will assess and treat for/with: n/a. Goals are: n/a. 10. Case Management and Social Worker will assess and treat for psychological issues and discharge planning. 11. Team conference will be held weekly to assess progress toward goals and to determine barriers to discharge. 12. Patient will receive at least 3 hours of therapy per day at least 5 days per week. 13. ELOS: 13-17 days  14. Prognosis: excellent  Meredith Staggers, MD, [place] 08/03/2014

## 2014-08-04 ENCOUNTER — Inpatient Hospital Stay (HOSPITAL_COMMUNITY): Payer: Medicare Other | Admitting: Occupational Therapy

## 2014-08-04 ENCOUNTER — Inpatient Hospital Stay (HOSPITAL_COMMUNITY): Payer: Medicare Other | Admitting: Physical Therapy

## 2014-08-04 DIAGNOSIS — I272 Other secondary pulmonary hypertension: Secondary | ICD-10-CM

## 2014-08-04 DIAGNOSIS — M7989 Other specified soft tissue disorders: Secondary | ICD-10-CM

## 2014-08-04 DIAGNOSIS — S72001D Fracture of unspecified part of neck of right femur, subsequent encounter for closed fracture with routine healing: Secondary | ICD-10-CM

## 2014-08-04 LAB — CBC WITH DIFFERENTIAL/PLATELET
BASOS PCT: 0 % (ref 0–1)
Basophils Absolute: 0 10*3/uL (ref 0.0–0.1)
EOS ABS: 0.5 10*3/uL (ref 0.0–0.7)
Eosinophils Relative: 3 % (ref 0–5)
HCT: 26.9 % — ABNORMAL LOW (ref 36.0–46.0)
HEMOGLOBIN: 9.3 g/dL — AB (ref 12.0–15.0)
LYMPHS PCT: 8 % — AB (ref 12–46)
Lymphs Abs: 1.3 10*3/uL (ref 0.7–4.0)
MCH: 29 pg (ref 26.0–34.0)
MCHC: 34.6 g/dL (ref 30.0–36.0)
MCV: 83.8 fL (ref 78.0–100.0)
Monocytes Absolute: 0.9 10*3/uL (ref 0.1–1.0)
Monocytes Relative: 6 % (ref 3–12)
Neutro Abs: 13.1 10*3/uL — ABNORMAL HIGH (ref 1.7–7.7)
Neutrophils Relative %: 83 % — ABNORMAL HIGH (ref 43–77)
Platelets: 304 10*3/uL (ref 150–400)
RBC: 3.21 MIL/uL — ABNORMAL LOW (ref 3.87–5.11)
RDW: 13.7 % (ref 11.5–15.5)
WBC: 15.8 10*3/uL — ABNORMAL HIGH (ref 4.0–10.5)

## 2014-08-04 LAB — GLUCOSE, CAPILLARY
Glucose-Capillary: 159 mg/dL — ABNORMAL HIGH (ref 70–99)
Glucose-Capillary: 286 mg/dL — ABNORMAL HIGH (ref 70–99)
Glucose-Capillary: 166 mg/dL — ABNORMAL HIGH (ref 70–99)

## 2014-08-04 LAB — COMPREHENSIVE METABOLIC PANEL
ALBUMIN: 2 g/dL — AB (ref 3.5–5.2)
ALK PHOS: 101 U/L (ref 39–117)
ALT: <5 U/L (ref 0–35)
AST: 13 U/L (ref 0–37)
Anion gap: 12 (ref 5–15)
BILIRUBIN TOTAL: 0.4 mg/dL (ref 0.3–1.2)
BUN: 15 mg/dL (ref 6–23)
CHLORIDE: 92 meq/L — AB (ref 96–112)
CO2: 29 meq/L (ref 19–32)
CREATININE: 0.81 mg/dL (ref 0.50–1.10)
Calcium: 8.8 mg/dL (ref 8.4–10.5)
GFR calc Af Amer: 84 mL/min — ABNORMAL LOW (ref >90)
GFR, EST NON AFRICAN AMERICAN: 72 mL/min — AB (ref >90)
Glucose, Bld: 135 mg/dL — ABNORMAL HIGH (ref 70–99)
POTASSIUM: 4.4 meq/L (ref 3.7–5.3)
Sodium: 133 mEq/L — ABNORMAL LOW (ref 137–147)
Total Protein: 5.9 g/dL — ABNORMAL LOW (ref 6.0–8.3)

## 2014-08-04 MED ORDER — DICLOFENAC SODIUM 1 % TD GEL
2.0000 g | Freq: Four times a day (QID) | TRANSDERMAL | Status: DC
  Administered 2014-08-04 – 2014-08-16 (×47): 2 g via TOPICAL
  Filled 2014-08-04 (×3): qty 100

## 2014-08-04 NOTE — Progress Notes (Signed)
Patient transferred to [facility] on 08/03/14.  CSW met with patient and husband again on 08/02/14 to follow up on d/c options and plans- husband was adamant that patient would either go to CIR or d/c home with home health. He was very concerned that as to why a "Education officer, museum" would be checking on them as they were not indigent.  CSW explained the role of the hospital SW and how we can assist patient's and families with many different things. He verbalized understanding and stated that he would not agree to a SNF back up for his wife and was confident that she would go to CIR. CSW offered support and will sign off.  Lorie Phenix. Pauline Good, [place]

## 2014-08-04 NOTE — Progress Notes (Signed)
68 y.o. right-handed female with history of systolic congestive heart failure, severe PAH(pulmonary arterial hypertension) maintained on continuous flofan infusion, fibromyalgia, COPD with chronic oxygen of 6 L, CAD with CABG and diabetes mellitus with peripheral neuropathy. Independent prior to admission living with her husband and used a walker outside of the home. Presented 07/27/2014 after mechanical fall. Denies loss of consciousness or associated chest pain or shortness or breath associated with fall. X-rays and imaging revealed a right hip displaced and angulated femoral neck fracture. Cardiology followup preoperatively and cleared for surgery. Underwent right total hip arthroplasty 07/29/2014 per Dr. Ninfa Linden. Weightbearing as tolerated with anterior hip precautions. Hospital course pain management. Subcutaneous Lovenox for DVT prophylaxis. Acute blood loss anemia and transfuse   Subjective/Complaints:   Objective: Vital Signs: Blood pressure 123/55, pulse 105, temperature 98 F (36.7 C), temperature source Oral, resp. rate 18, SpO2 100.00%. No results found. Results for orders placed during the hospital encounter of 08/03/14 (from the past 72 hour(s))  GLUCOSE, CAPILLARY     Status: Abnormal   Collection Time    08/03/14  8:55 PM      Result Value Ref Range   Glucose-Capillary 185 (*) 70 - 99 mg/dL  CBC WITH DIFFERENTIAL     Status: Abnormal   Collection Time    08/04/14  6:22 AM      Result Value Ref Range   WBC 15.8 (*) 4.0 - 10.5 K/uL   Comment: WHITE COUNT CONFIRMED ON SMEAR   RBC 3.21 (*) 3.87 - 5.11 MIL/uL   Hemoglobin 9.3 (*) 12.0 - 15.0 g/dL   HCT 26.9 (*) 36.0 - 46.0 %   MCV 83.8  78.0 - 100.0 fL   MCH 29.0  26.0 - 34.0 pg   MCHC 34.6  30.0 - 36.0 g/dL   RDW 13.7  11.5 - 15.5 %   Platelets 304  150 - 400 K/uL   Comment: PLATELET COUNT CONFIRMED BY SMEAR   Neutrophils Relative % 83 (*) 43 - 77 %   Lymphocytes Relative 8 (*) 12 - 46 %   Monocytes Relative 6  3 - 12 %    Eosinophils Relative 3  0 - 5 %   Basophils Relative 0  0 - 1 %   Neutro Abs 13.1 (*) 1.7 - 7.7 K/uL   Lymphs Abs 1.3  0.7 - 4.0 K/uL   Monocytes Absolute 0.9  0.1 - 1.0 K/uL   Eosinophils Absolute 0.5  0.0 - 0.7 K/uL   Basophils Absolute 0.0  0.0 - 0.1 K/uL  COMPREHENSIVE METABOLIC PANEL     Status: Abnormal   Collection Time    08/04/14  6:22 AM      Result Value Ref Range   Sodium 133 (*) 137 - 147 mEq/L   Potassium 4.4  3.7 - 5.3 mEq/L   Comment: HEMOLYSIS AT THIS LEVEL MAY AFFECT RESULT   Chloride 92 (*) 96 - 112 mEq/L   CO2 29  19 - 32 mEq/L   Glucose, Bld 135 (*) 70 - 99 mg/dL   BUN 15  6 - 23 mg/dL   Creatinine, Ser 0.81  0.50 - 1.10 mg/dL   Calcium 8.8  8.4 - 10.5 mg/dL   Total Protein 5.9 (*) 6.0 - 8.3 g/dL   Albumin 2.0 (*) 3.5 - 5.2 g/dL   AST 13  0 - 37 U/L   Comment: HEMOLYSIS AT THIS LEVEL MAY AFFECT RESULT   ALT <5  0 - 35 U/L   Comment: HEMOLYSIS  AT THIS LEVEL MAY AFFECT RESULT   Alkaline Phosphatase 101  39 - 117 U/L   Total Bilirubin 0.4  0.3 - 1.2 mg/dL   GFR calc non Af Amer 72 (*) >90 mL/min   GFR calc Af Amer 84 (*) >90 mL/min   Comment: (NOTE)     The eGFR has been calculated using the CKD EPI equation.     This calculation has not been validated in all clinical situations.     eGFR's persistently <90 mL/min signify possible Chronic Kidney     Disease.   Anion gap 12  5 - 15  GLUCOSE, CAPILLARY     Status: Abnormal   Collection Time    08/04/14 11:59 AM      Result Value Ref Range   Glucose-Capillary 159 (*) 70 - 99 mg/dL      Constitutional: She is oriented to person, place, and time. She appears well-developed.  HENT: oral mucosa moist, dentition fair  Head: Normocephalic.  Eyes: EOM are normal.  Neck: Normal range of motion. Neck supple. No thyromegaly present.  Cardiovascular: Normal rate and regular rhythm.  Respiratory: Breath sounds normal. No respiratory distress.  GI: Soft. Bowel sounds are normal. She exhibits no distension.   Neurological: She is alert and oriented to person, place, and time. Fair insight and awareness.  Follows three-step commands. HOH sometimes limiting. UES: 4+ to 5/5. DPO:EUMPNTI due to pain, 1/5 hf, 1+ KE and 4- ADF/APF. LLE grossly 3+ to 4+/5 prox to distal. No gross sensory findings.  Skin:  Hip incision clean and intact with staples.  M/S: right hip with mild edema. Area very sensitive to touch.  Psychiatric:  Affect flat, does not initiate conversation, sometimes anxious   Assessment/Plan: 1. Functional deficits secondary to Right Femoral neck fracture which require 3+ hours per day of interdisciplinary therapy in a comprehensive inpatient rehab setting. Physiatrist is providing close team supervision and 24 hour management of active medical problems listed below. Physiatrist and rehab team continue to assess barriers to discharge/monitor patient progress toward functional and medical goals. FIM: FIM - Bathing Bathing Steps Patient Completed: Chest;Right Arm;Left Arm;Abdomen;Right upper leg;Left upper leg;Front perineal area;Buttocks Bathing: 4: Steadying assist (at EOB, 2 sit<>stand t/f)  FIM - Upper Body Dressing/Undressing Upper body dressing/undressing: 0: Wears gown/pajamas-no public clothing FIM - Lower Body Dressing/Undressing Lower body dressing/undressing: 0: Wears Interior and spatial designer        FIM - Control and instrumentation engineer Devices: Environmental consultant;Arm rests;Bed rails Bed/Chair Transfer: 3: Supine > Sit: Mod A (lifting assist/Pt. 50-74%/lift 2 legs;2: Bed > Chair or W/C: Max A (lift and lower assist)     Comprehension Comprehension Mode: Auditory Comprehension: 5-Follows basic conversation/direction: With extra time/assistive device  Expression Expression Mode: Verbal Expression: 6-Expresses complex ideas: With extra time/assistive device  Social Interaction Social Interaction: 6-Interacts appropriately with others with medication or  extra time (anti-anxiety, antidepressant).  Problem Solving Problem Solving: 4-Solves basic 75 - 89% of the time/requires cueing 10 - 24% of the time  Memory Memory: 3-Recognizes or recalls 50 - 74% of the time/requires cueing 25 - 49% of the time  Medical Problem List and Plan:  1. Functional deficits secondary to displaced right femoral neck fracture. 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 Weightbearing as tolerated with anterior hip precautions  2. DVT Prophylaxis/Anticoagulation: Subcutaneous Lovenox. Monitor platelet counts and any signs of bleeding. Check vascular study  3. Pain Management: Oxycodone as needed. Monitor with increased mobility  4. Mood/anxiety/depression: Xanax 1 mg each  bedtime. Provide emotional support  5. Neuropsych: This patient is capable of making decisions on her own behalf.  6. Skin/Wound Care: Routine skin checks  7. Fluids/Electrolytes/Nutrition: Followup chemistries was strict I and Os. Provide nutritional supplements as needed  8. Severe pulmonary arterial hypertension. Continue Flolan per cardiology services as well as Toprol-XL 100 mg daily, Imdur 90 mg daily, Lasix 80 mg daily. Monitor with increased mobility  9. Diabetes mellitus with peripheral neuropathy. Latest hemoglobin A1c 6.3. Lantus insulin 8 units each bedtime. Check blood sugars a.c. and at bedtime  10. Hypothyroidism. Continue Synthroid  11. Hyperlipidemia.Vytorin  12. Recurrent Clostridium difficile. Contact precautions. Vancomycin 125 mg 4 times a day x14 days initiated 08/01/2014  13. COPD/chronic respiratory failure. Oxygen therapy as directed    LOS (Days) 1 A FACE TO FACE EVALUATION WAS PERFORMED  KIRSTEINS,ANDREW E 08/04/2014, 2:47 PM

## 2014-08-04 NOTE — Progress Notes (Signed)
Meagan Staggers, MD Physician Signed Physical Medicine and Rehabilitation Consult Note Service date: 08/02/2014 6:00 AM  Related encounter: Admission (Discharged) from 07/27/2014 in [facility]           Physical Medicine and Rehabilitation Consult Reason for Consult: Right femoral neck fracture Referring Physician: [facility]     HPI: Meagan [name] is a 68 y.o. right-handed female with history of systolic congestive heart failure, severe PAH(pulmonary arterial hypertension) maintained on continuous flofan infusion, fibromyalgia, COPD with chronic oxygen, CAD with CABG and diabetes mellitus with peripheral neuropathy. Independent prior to admission living with her husband and used a walker outside of the home. Presented 07/27/2014 after mechanical fall. Denies loss of consciousness or associated chest pain or shortness or breath associated with fall. X-rays and imaging revealed a right hip displaced and angulated femoral neck fracture. Cardiology followup preoperatively and cleared for surgery. Underwent 8 total hip arthroplasty 07/29/2014 per Dr. Ninfa Linden. Weightbearing as tolerated with anterior hip cautions. Hospital course pain management. Subcutaneous Lovenox for DVT prophylaxis. Acute blood loss anemia 7.4 and monitored. Patient with recent history of Clostridium difficile maintained on contact precautions. Physical therapy evaluation completed with recommendations for physical medicine rehabilitation consult.     Review of Systems  Respiratory: Positive for shortness of breath.   Cardiovascular: Positive for leg swelling.  Gastrointestinal:        GERD  Musculoskeletal: Positive for joint pain and myalgias.  Psychiatric/Behavioral:        Anxiety  All other systems reviewed and are negative. Past Medical History   Diagnosis  Date   .  Fibromyalgia     .  Hypercholesterolemia     .  Hypertension         pulmonary  pap 110/31 (mean 62)by [facility] 9/10  negative PE by chest CT 2010   .  Pulmonary edema         Failed Revatio    .  ARF (acute renal failure)         poor candidate for ACE -I or ARB   .  Restrictive lung disease         obesity hypoventilation syndrome   .  Renal insufficiency     .  CAD (coronary artery disease)         multivessel DES x 2 RCA 2007   .  Chronic diastolic heart failure     .  MRSA (methicillin resistant staphylococcus aureus) pneumonia     .  Pulmonary hypertension     .  Chronic anemia         anemia of chronic disease based on anemia panel 2011   .  Pancreatic mass  08/01/2012   .  PONV (postoperative nausea and vomiting)     .  Heart murmur         "when I was small"   .  CHF (congestive heart failure)     .  Anginal pain     .  Pneumonia  X 3   .  Type II diabetes mellitus     .  Hypothyroidism     .  H/O hiatal hernia     .  GERD (gastroesophageal reflux disease)     .  Anxiety     .  On home oxygen therapy         "6L all the time" (02/24/2014    Past Surgical History   Procedure  Laterality  Date   .  Coronary artery bypass graft    1997       LIMA to LAD,SVG to OM   .  Esophagogastroduodenoscopy    06/28/10       schatzki ring otherwise normal;small hiatal hernia   .  Coronary angioplasty    1993   .  Coronary angioplasty with stent placement    ~ 1996; 2008       "think I have 3 stents"   .  Abdominal hysterectomy    ~ 1986   .  Dilation and curettage of uterus    ~ 1984   .  Tubal ligation       .  Esophagogastroduodenoscopy  N/A  06/06/2014       Procedure: ESOPHAGOGASTRODUODENOSCOPY (EGD);  Surgeon: Lafayette Dragon, MD;  Location: [facility] ENDOSCOPY;  Service: Endoscopy;  Laterality: N/A;   .  Hip arthroplasty  Right  07/29/2014       Procedure: ARTHROPLASTY BIPOLAR HIP;  Surgeon: Mcarthur Rossetti, MD;  Location: [place];  Service: Orthopedics;  Laterality: Right;    Family History   Problem  Relation  Age of Onset   .  Cancer  Mother         oral   .  Heart attack  Father     .   COPD  Brother     .  Heart disease  Brother      Social History: reports that she has never smoked. She has never used smokeless tobacco. She reports that she does not drink alcohol or use illicit drugs. Allergies:   Allergies   Allergen  Reactions   .  Aspirin         REACTION: Intolerance   .  Codeine  Other (See Comments)       Messes with digestive system   .  Niacin  Other (See Comments)       Pins sticking into skin   .  Nsaids  Other (See Comments)       REACTION: Aggravates Digestive System    .  Phenergan Fortis [Promethazine]  Other (See Comments)       Family and pt states becomes very confused and wild.    .  Sulfonamide Derivatives  Other (See Comments)       Messes with digestive system    Medications Prior to Admission   Medication  Sig  Dispense  Refill   .  ALPRAZolam (XANAX) 0.5 MG tablet  Take 1 mg by mouth at bedtime. For sleep. *Prescribed one tablet three times daily as needed*         .  aspirin EC 81 MG EC tablet  Take 1 tablet (81 mg total) by mouth daily.   30 tablet   0   .  DULoxetine (CYMBALTA) 30 MG capsule  Take 30 mg by mouth at bedtime.          .  DULoxetine (CYMBALTA) 60 MG capsule  Take 60 mg by mouth every morning.          Marland Kitchen  Epoprostenol Sodium (FLOLAN IV)  Inject into the vein. 1.5 mg. Inject into the vein continuously. Infuse mg/kg/min via cont IV pump.         Marland Kitchen  ezetimibe-simvastatin (VYTORIN) 10-40 MG per tablet  Take 1 tablet by mouth every evening.         .  furosemide (LASIX) 40 MG tablet  Take 80 mg by mouth daily.          Marland Kitchen  HYDROmorphone (DILAUDID) 2 MG tablet  Take 4 mg by mouth 2 (two) times daily.          .  insulin glargine (LANTUS) 100 UNIT/ML injection  Inject 0.12 mLs (12 Units total) into the skin every morning.         .  isosorbide mononitrate (IMDUR) 60 MG 24 hr tablet  Take 90 mg by mouth every morning.          Marland Kitchen  levothyroxine (SYNTHROID, LEVOTHROID) 25 MCG tablet  Take 25 mcg by mouth every morning.          .   metoprolol (TOPROL-XL) 100 MG 24 hr tablet  Take 100 mg by mouth every morning.          Marland Kitchen  omeprazole (PRILOSEC) 20 MG capsule  Take 20 mg by mouth 2 (two) times daily.           .  ondansetron (ZOFRAN) 8 MG tablet  Take 8 mg by mouth 2 (two) times daily. For nausea         .  Oxygen Permeable Lens Products SOLN  Inhale into the lungs continuous. 6 liters         .  Oxymetazoline HCl (NASAL SPRAY) 0.05 % SOLN  Place 1 spray into the nose as needed (for congestion).         .  potassium chloride SA (K-DUR,KLOR-CON) 20 MEQ tablet  Take 40 mEq by mouth daily.          .  Probiotic Product (FLORAJEN3 PO)  Take 1 tablet by mouth daily.         .  traMADol (ULTRAM) 50 MG tablet  Take 50 mg by mouth every 3 (three) hours as needed. For pain. *takes with Dilaudid (Hydromorphone)         .  VYTORIN 10-40 MG per tablet  TAKE ONE TABLET BY MOUTH AT BEDTIME   90 tablet   3   .  nitroGLYCERIN (NITROSTAT) 0.4 MG SL tablet  Place 0.4 mg under the tongue every 5 (five) minutes as needed for chest pain.             Home: Home Living Family/patient expects to be discharged to:: Private residence Living Arrangements: Spouse/significant other Available Help at Discharge: Family;Available 24 hours/day Type of Home: House Home Access: Stairs to enter [employer] of Steps: 2 Entrance Stairs-Rails: None Home Layout: One level Home Equipment: Walker - 4 wheels;Cane - single point;Shower seat - built in;Bedside commode;Electric scooter;Grab bars - tub/shower   Functional History: Prior Function Level of Independence: Independent Comments: Husband assists with bathing due to pump for pulmonary hypertension. Pt has not driven in a year. Can go out in the community when she feels good. On bad days she stays at home in bed mostly.  Functional Status:   Mobility: Bed Mobility Overal bed mobility: +2 for physical assistance;Needs Assistance Bed Mobility: Supine to Sit;Sit to Supine Supine to sit: Total  assist;+2 for physical assistance Sit to supine: Max assist;+2 for physical assistance General bed mobility comments: Pt able to assist with BUT getting into bed Transfers Overall transfer level: Needs assistance Equipment used: 2 person hand held assist Transfers: Sit to/from [employer] Transfers Sit to Stand: Total assist;+2 physical assistance Stand pivot transfers: Total assist;+2 physical assistance General transfer comment: BSC trasnfers/ moved recliner and placed BSC under pt   ADL: ADL Overall ADL's : Needs assistance/impaired Grooming: Set up;Supervision/safety;Sitting Upper Body Bathing: Moderate assistance;Sitting Lower  Body Bathing: Total assistance;Sit to/from stand Upper Body Dressing : Moderate assistance;Sitting Lower Body Dressing: Total assistance;Sit to/from stand Toilet Transfer: +2 for physical assistance;Total assistance;BSC Toileting- Clothing Manipulation and Hygiene: +2 for physical assistance;Sit to/from stand (+3 required to clean bottom in standing.) Functional mobility during ADLs: +2 for physical assistance;Total assistance;Rolling walker;Cueing for sequencing;Cueing for safety General ADL Comments: Assisted nursing with toilet transfer and hygiene after toileitng , requiring +2 skilled assistance using gait belt and bariatric transfer technique.  Educating nursing onsafest way for mobility   Cognition: Cognition Overall Cognitive Status: Impaired/Different from baseline Orientation Level: Oriented X4 Cognition Arousal/Alertness: Awake/alert Behavior During Therapy: Anxious Overall Cognitive Status: Impaired/Different from baseline Area of Impairment: Attention;Memory;Safety/judgement;Awareness;Problem solving Memory: Decreased short-term memory Safety/Judgement: Decreased awareness of safety;Decreased awareness of deficits Awareness: Intellectual Problem Solving: Slow processing;Difficulty sequencing General Comments: Question if pt has  some baseline cogntivie deficits. apparently imipaired.    Blood pressure 106/33, pulse 99, temperature 98.2 F (36.8 C), temperature source Oral, resp. rate 17, height 5' (1.524 m), weight 67 kg (147 lb 11.3 oz), SpO2 100.00%. Physical Exam  Constitutional: She is oriented to person, place, and time. She appears well-developed.  HENT:   Head: Normocephalic.  Eyes: EOM are normal.  Neck: Normal range of motion. Neck supple. No thyromegaly present.  Cardiovascular: Normal rate and regular rhythm.   Respiratory: Breath sounds normal. No respiratory distress.  GI: Soft. Bowel sounds are normal. She exhibits no distension.  Neurological: She is alert and oriented to person, place, and time.  Follows three-step commands. HOH.   Skin:  Hip incision clean and dry appropriately tender  Psychiatric:  A little flat, anxious     Results for orders placed during the hospital encounter of 07/27/14 (from the past 24 hour(s))   CLOSTRIDIUM DIFFICILE BY PCR     Status: Abnormal     Collection Time      08/01/14 11:41 AM       Result  Value  Ref Range     C difficile by pcr  POSITIVE (*)  NEGATIVE   GLUCOSE, CAPILLARY     Status: Abnormal     Collection Time      08/01/14 11:44 AM       Result  Value  Ref Range     Glucose-Capillary  205 (*)  70 - 99 mg/dL     Comment 1  Notify RN      GLUCOSE, CAPILLARY     Status: Abnormal     Collection Time      08/01/14  4:20 PM       Result  Value  Ref Range     Glucose-Capillary  118 (*)  70 - 99 mg/dL     Comment 1  Notify RN      GLUCOSE, CAPILLARY     Status: Abnormal     Collection Time      08/01/14  9:11 PM       Result  Value  Ref Range     Glucose-Capillary  185 (*)  70 - 99 mg/dL   CBC     Status: Abnormal     Collection Time      08/02/14  4:12 AM       Result  Value  Ref Range     WBC  19.1 (*)  4.0 - 10.5 K/uL     RBC  2.63 (*)  3.87 - 5.11 MIL/uL     Hemoglobin  7.4 (*)  12.0 -  15.0 g/dL     HCT  21.9 (*)  36.0 - 46.0 %     MCV   83.3   78.0 - 100.0 fL     MCH  28.1   26.0 - 34.0 pg     MCHC  33.8   30.0 - 36.0 g/dL     RDW  13.8   11.5 - 15.5 %     Platelets  264   150 - 400 K/uL   BASIC METABOLIC PANEL     Status: Abnormal     Collection Time      08/02/14  4:12 AM       Result  Value  Ref Range     Sodium  132 (*)  137 - 147 mEq/L     Potassium  3.9   3.7 - 5.3 mEq/L     Chloride  91 (*)  96 - 112 mEq/L     CO2  29   19 - 32 mEq/L     Glucose, Bld  146 (*)  70 - 99 mg/dL     BUN  18   6 - 23 mg/dL     Creatinine, Ser  0.90   0.50 - 1.10 mg/dL     Calcium  8.5   8.4 - 10.5 mg/dL     GFR calc non Af Amer  64 (*)  >90 mL/min     GFR calc Af Amer  74 (*)  >90 mL/min     Anion gap  12   5 - 15    Dg Chest Port 1v Same Day   08/01/2014   CLINICAL DATA:  Four days post right hip arthroplasty. Leukocytosis. Prior CABG. Acute respiratory failure this admission, now extubated.  EXAM: PORTABLE CHEST - 1 VIEW SAME DAY  COMPARISON:  Portable chest x-rays 07/29/2014, 07/27/2014.  FINDINGS: Extubation. Sternotomy for CABG. Cardiac silhouette moderately enlarged. Right coronary stem. Pulmonary venous hypertension without overt edema. Interval resolution of the patchy airspace opacities in the right upper lobe. New linear atelectasis scattered throughout both lungs. More focal consolidation in the retrocardiac left lower lobe. Possible small bilateral pleural effusions. Right jugular Hickman catheter tip projects over the upper right atrium.  IMPRESSION: 1. Resolution of right upper lobe pneumonia since the examination 3 days ago. 2. New streaky atelectasis or bronchopneumonia in the left lower lobe. 3. Linear atelectasis in both upper lobes.   Electronically Signed   By: Evangeline Dakin M.D.   On: 08/01/2014 16:21     Assessment/Plan: Diagnosis: right femoral neck fracture Does the need for close, 24 hr/day medical supervision in concert with the patient's rehab needs make it unreasonable for this patient to be served in a  less intensive setting? Yes Co-Morbidities requiring supervision/potential complications: dm, PAH, recent c diff, ckd Due to bladder management, bowel management, safety, skin/wound care, disease management, medication administration, pain management and patient education, does the patient require 24 hr/day rehab nursing? Yes Does the patient require coordinated care of a physician, rehab nurse, PT (1-2 hrs/day, 5 days/week) and OT (1-2 hrs/day, 5 days/week) to address physical and functional deficits in the context of the above medical diagnosis(es)? Yes Addressing deficits in the following areas: balance, endurance, locomotion, strength, transferring, bowel/bladder control, bathing, dressing, feeding, grooming, toileting and psychosocial support Can the patient actively participate in an intensive therapy program of at least 3 hrs of therapy per day at least 5 days per week? Yes The potential for patient to make measurable gains  while on inpatient rehab is excellent Anticipated functional outcomes upon discharge from inpatient rehab are supervision and min assist  with PT, supervision and min assist with OT, n/a with SLP. Estimated rehab length of stay to reach the above functional goals is: 10-14 days Does the patient have adequate social supports to accommodate these discharge functional goals? Yes Anticipated D/C setting: Home Anticipated post D/C treatments: HH therapy and Outpatient therapy Overall Rehab/Functional Prognosis: excellent   RECOMMENDATIONS: This patient's condition is appropriate for continued rehabilitative care in the following setting: CIR Patient has agreed to participate in recommended program. Potentially Note that insurance prior authorization may be required for reimbursement for recommended care.   Comment: Rehab Admissions Coordinator to follow up.   Thanks,   Meagan Staggers, MD, Mellody Drown         08/02/2014    Revision History...     Date/Time  User Action   08/02/2014 9:40 AM Meagan Staggers, MD Sign   08/02/2014 6:27 AM Cathlyn Parsons, PA-C Pend  View Details Report   Routing History...     Date/Time From To Method   08/02/2014 9:40 AM Meagan Staggers, MD Meagan Staggers, MD In Basket   08/02/2014 9:40 AM Meagan Staggers, MD [employer], MD Fax

## 2014-08-04 NOTE — Evaluation (Signed)
Occupational Therapy Assessment, Plan, and Session Notes  Patient Details  Name: Meagan [name] MRN: [id number] Date of Birth: 1946-06-20  OT Diagnosis: acute pain and muscle weakness (generalized) Rehab Potential: Rehab Potential: Good ELOS: 2 weeks   Today's Date: 08/04/2014  Problem List:  Patient Active Problem List   Diagnosis Date Noted  . NSVT (nonsustained ventricular tachycardia) 08/03/2014  . Hip fracture 08/03/2014  . Right renal mass 07/29/2014  . Anemia 07/29/2014  . Pre-operative cardiovascular examination 07/28/2014  . Closed right hip fracture 07/27/2014  . CKD (chronic kidney disease), stage III 07/27/2014  . C. difficile diarrhea 06/08/2014  . Reflux esophagitis 06/06/2014  . Upper GI bleed 06/03/2014  . Pancreatic mass 08/01/2012  . Hepatomegaly 05/07/2012  . CAD-native artery   . GERD 06/20/2010  . DM (diabetes mellitus), type 2, uncontrolled 05/10/2010  . Acute on chronic diastolic heart failure 40/34/7425  . PAH (pulmonary artery hypertension) 07/25/2009  . Chronic diastolic heart failure     Past Medical History:  Past Medical History  Diagnosis Date  . Fibromyalgia   . Hypercholesterolemia   . Hypertension     pulmonary  pap 110/31 (mean 62)by [facility] 9/10 negative PE by chest CT 2010  . Pulmonary edema     Failed Revatio   . ARF (acute renal failure)     poor candidate for ACE -I or ARB  . Restrictive lung disease     obesity hypoventilation syndrome  . Renal insufficiency   . CAD (coronary artery disease)     multivessel DES x 2 RCA 2007  . Chronic diastolic heart failure   . MRSA (methicillin resistant staphylococcus aureus) pneumonia   . Pulmonary hypertension   . Chronic anemia     anemia of chronic disease based on anemia panel 2011  . Pancreatic mass 08/01/2012  . PONV (postoperative nausea and vomiting)   . Heart murmur     "when I was small"  . CHF (congestive heart failure)   . Anginal pain   . Pneumonia X 3  . Type II  diabetes mellitus   . Hypothyroidism   . H/O hiatal hernia   . GERD (gastroesophageal reflux disease)   . Anxiety   . On home oxygen therapy     "6L all the time" (02/24/2014   Past Surgical History:  Past Surgical History  Procedure Laterality Date  . Coronary artery bypass graft  1997    LIMA to LAD,SVG to OM  . Esophagogastroduodenoscopy  06/28/10    schatzki ring otherwise normal;small hiatal hernia  . Coronary angioplasty  1993  . Coronary angioplasty with stent placement  ~ 1996; 2008    "think I have 3 stents"  . Abdominal hysterectomy  ~ 1986  . Dilation and curettage of uterus  ~ 1984  . Tubal ligation    . Esophagogastroduodenoscopy N/A 06/06/2014    Procedure: ESOPHAGOGASTRODUODENOSCOPY (EGD);  Surgeon: Lafayette Dragon, MD;  Location: [facility] ENDOSCOPY;  Service: Endoscopy;  Laterality: N/A;  . Hip arthroplasty Right 07/29/2014    Procedure: ARTHROPLASTY BIPOLAR HIP;  Surgeon: Mcarthur Rossetti, MD;  Location: [facility];  Service: Orthopedics;  Laterality: Right;    Assessment & Plan Clinical Impression: [name] is a 68 y.o. right-handed female with history of systolic congestive heart failure, severe PAH(pulmonary arterial hypertension) maintained on continuous flofan infusion, fibromyalgia, COPD with chronic oxygen of 6 L, CAD with CABG and diabetes mellitus with peripheral neuropathy. Independent prior to admission living with her husband and  used a walker outside of the home. Presented 07/27/2014 after mechanical fall. Denies loss of consciousness or associated chest pain or shortness or breath associated with fall. X-rays and imaging revealed a right hip displaced and angulated femoral neck fracture. Cardiology followup preoperatively and cleared for surgery. Underwent right total hip arthroplasty 07/29/2014 per Dr. Ninfa Linden. Weightbearing as tolerated with anterior hip precautions. Hospital course pain management. Subcutaneous Lovenox for DVT prophylaxis. Acute blood loss  anemia and transfuse 1 unit packed red blood cells with latest hemoglobin 8.9 and monitored. Patient with history of recurrent Clostridium difficile maintained on contact precautions as well as vancomycin 125 mg 4 times a day x14 days initiated 08/01/2014.Marland Kitchen Physical and occupational therapy evaluations completed with recommendations for physical medicine rehabilitation consult. Patient was admitted for comprehensive rehabilitation program. Patient transferred to CIR on 08/03/2014 .    Patient currently requires mod to total A with basic self-care skills secondary to muscle weakness, decreased cardiorespiratoy endurance, decreased initiation, decreased problem solving, decreased safety awareness and decreased memory and decreased sitting balance, decreased standing balance, decreased postural control, decreased balance strategies and difficulty maintaining precautions.  Prior to hospitalization, patient could complete ADLs with supervision.  Patient will benefit from skilled intervention to increase independence with basic self-care skills prior to discharge home with care partner.  Anticipate patient will require intermittent supervision and follow up home health.  OT - End of Session Activity Tolerance: Tolerates 30+ min activity with multiple rests Endurance Deficit: Yes OT Assessment Rehab Potential: Good OT Patient demonstrates impairments in the following area(s): Balance;Edema;Endurance;Cognition;Pain;Safety;Skin Integrity OT Basic ADL's Functional Problem(s): Grooming;Bathing;Dressing;Toileting OT Transfers Functional Problem(s): Toilet;Tub/Shower OT Plan OT Intensity: Minimum of 1-2 x/day, 45 to 90 minutes OT Frequency: 5 out of 7 days OT Duration/Estimated Length of Stay: 2 weeks OT Treatment/Interventions: Balance/vestibular training;Cognitive remediation/compensation;Community reintegration;Discharge planning;DME/adaptive equipment instruction;Functional mobility training;Patient/family  education;Psychosocial support;Self Care/advanced ADL retraining;Therapeutic Activities;Therapeutic Exercise;UE/LE Strength taining/ROM OT Basic Self-Care Anticipated Outcome(s): S to Mod I OT Toileting Anticipated Outcome(s): S to Mod I OT Bathroom Transfers Anticipated Outcome(s): S to Mod I OT Recommendation Patient destination: Home Follow Up Recommendations: Home health OT Equipment Recommended: To be determined   Skilled Therapeutic Intervention Session 1: OT Individual Time: 0900-1000 OT Individual Time Calculation (min): 60 min    Pt supine in bed w/ sister and RN in room when arriving, reporting 9/10 pain and RN administered pain medication. Initial OT evaluation performed. Pt w/ difficulty hearing, reporting this is only a problem in the hospital. Pt educated on anterior hip precautions, reporting that she had never been taught these prior to rehab admission. Pt t/f from supine to EOB, needing mod v/c for following hip precautions, and performed UB/LB bathing w/ 2 sit<>stand t/f. Pt attempted t/f to w/c using RW, began to feel nauseous. Pt given a rest break and feeling subsided. Pt t/f to w/c w/ squat pivot. Pt seated in w/c w/ elevated R leg rest and all needs nearby when leaving.  Session 2: OT Individual Time: 1330-1430 OT Individual Time Calculation (min): 60 min   Pt supine in bed w/ sister in room when arriving, reporting 9/10 pain in R hip, RN made aware. Educated Therapist, sports on positioning of RLE when pt supine in bed and on anterior hip precautions, posted sign on anterior hip precautions in pt room. Pt t/f to EOB and then to w/c w/ squat pivot. Pt educated on propulsion of w/c and self-propelled w/c to bathroom. Pt completed toilet t/f, had BM and voided, completed toilet hygiene and  t/f to w/c. Pt brought to sink and attempted sit<>stand t/f for hand washing, became emotional and sat down quickly reporting that she "couldn't do all of this" and that therapist "expected too much."  Therapist provided psychosocial support and education on value of therapy for helping pt become stronger and more independent. RN administered medication while pt seated at sink. Pt completed handwashing and hair brushing while seated in w/c at sink. Husband arrived w/ clothing and pt completed UB/LB dressing w/ 3 sit<>stand t/f. Pt became emotional during dressing, appearing dependent on husband as she was asking for husband to help her, hold her hand, and to stay by her side. Husband and sister giving encouragement but also telling pt that she needed to "grow up and start to do things for herself." Pt calmed w/ a rest break but asked husband to "not leave her."  Pt seated in w/c w/ elevated leg rest and all needs nearby when leaving.   OT Evaluation Precautions/Restrictions  Precautions Precautions: Anterior Hip Restrictions Weight Bearing Restrictions: Yes RLE Weight Bearing: Weight bearing as tolerated  Home Living/Prior Functioning Home Living Available Help at Discharge: Family;Available 24 hours/day Type of Home: House Home Access: Stairs to enter [employer] of Steps: 2 in front, 5 in back with handrail on both sides but can only reach 1, 5 on side with B handrails Entrance Stairs-Rails: None Home Layout: One level  Lives With: Spouse IADL History Homemaking Responsibilities: No Homemaking Comments: Husband takes care of all homemaking  Current License: Yes Mode of TransportationOccupational psychologist Education: 1 year college Occupation: Retired Type of Occupation: Therapist, art Leisure and Hobbies: computers-online shoppinjg, reading Prior Function Level of Independence: Requires assistive device for independence;Needs assistance with ADLs Bath: Supervision/set-up  Able to Take Stairs?: Yes Driving: No Vocation: Retired Leisure: Hobbies-yes (Comment) Comments: Husband assists with bathing due to pump for pulmonary hypertension. Pt has not driven in a year. Can go out in the  community when she feels good. On bad days she stays at home in bed mostly.   ADL See FIM  Vision/Perception  Vision- History Baseline Vision/History: Wears glasses Wears Glasses: Reading only Patient Visual Report: No change from baseline Vision- Assessment Vision Assessment?: No apparent visual deficits   Cognition Overall Cognitive Status: Within Functional Limits for tasks assessed Arousal/Alertness: Lethargic Orientation Level: Oriented X4 Attention: Focused Focused Attention: Appears intact Memory: Impaired Awareness: Appears intact Safety/Judgment: Appears intact  Sensation Sensation Light Touch: Impaired Detail Light Touch Impaired Details: Impaired RUE;Impaired LUE;Impaired RLE;Impaired LLE (intermittent tingling/burning in hands/feet from peripheral neuropathy) Proprioception: Appears Intact Coordination Gross Motor Movements are Fluid and Coordinated: No Fine Motor Movements are Fluid and Coordinated: Not tested Coordination and Movement Description: antalgic R LE movements Finger Nose Finger Test: wfl, but intention tremor noted bilaterally  Motor  Motor Motor: Abnormal postural alignment and control Motor - Skilled Clinical Observations: slouched sitting with head down  Mobility  Bed Mobility Bed Mobility: Rolling Left;Sitting - Scoot to Edge of Bed Sitting - Scoot to Edge of Bed: 4: Min assist Sitting - Scoot to [employer] of Bed Details: Visual cues/gestures for sequencing;Verbal cues for precautions/safety;Tactile cues for initiation Transfers Transfers: Sit to Stand;Stand to Sit Sit to Stand: 3: Mod assist Sit to Stand Details: Verbal cues for precautions/safety;Verbal cues for safe use of DME/AE Stand to Sit: 3: Mod assist Stand to Sit Details (indicate cue type and reason): Verbal cues for precautions/safety;Verbal cues for safe use of DME/AE   Trunk/Postural Assessment  See PT note  Balance See PT note  Extremity/Trunk Assessment RUE  Assessment RUE Assessment: Within Functional Limits LUE Assessment LUE Assessment: Within Functional Limits  FIM:  FIM - Grooming Grooming Steps: Wash, rinse, dry face;Wash, rinse, dry hands Grooming: 4: Steadying assist  or patient completes 3 of 4 or 4 of 5 steps FIM - Bathing Bathing Steps Patient Completed: Chest;Right Arm;Left Arm;Abdomen;Right upper leg;Left upper leg;Front perineal area;Buttocks Bathing: 4: Steadying assist (at EOB, 2 sit<>stand t/f) FIM - Upper Body Dressing/Undressing Upper body dressing/undressing steps patient completed: Thread/unthread right sleeve of pullover shirt/dresss;Thread/unthread left sleeve of pullover shirt/dress;Put head through opening of pull over shirt/dress;Pull shirt over trunk Upper body dressing/undressing: 5: Set-up assist to: Obtain clothing/put away FIM - Lower Body Dressing/Undressing Lower body dressing/undressing: 1: Total-Patient completed less than 25% of tasks FIM - Control and instrumentation engineer Devices: Environmental consultant;Arm rests;Bed rails Bed/Chair Transfer: 3: Supine > Sit: Mod A (lifting assist/Pt. 50-74%/lift 2 legs;2: Bed > Chair or W/C: Max A (lift and lower assist) FIM - Radio producer Devices: Grab bars Toilet Transfers: 3-To toilet/BSC: Mod A (lift or lower assist);3-From toilet/BSC: Mod A (lift or lower assist) FIM - Tub/Shower Transfers Tub/shower Transfers: 0-Activity did not occur or was simulated   Refer to Care Plan for Long Term Goals  Recommendations for other services: None  Discharge Criteria: Patient will be discharged from OT if patient refuses treatment 3 consecutive times without medical reason, if treatment goals not met, if there is a change in medical status, if patient makes no progress towards goals or if patient is discharged from hospital.  The above assessment, treatment plan, treatment alternatives and goals were discussed and mutually agreed upon: by  patient  Shawneen Deetz Raynell 08/04/2014, 3:17 PM

## 2014-08-04 NOTE — Evaluation (Signed)
Physical Therapy Assessment and Plan  Patient Details  Name: Meagan [name] MRN: [id number] Date of Birth: [birth year]  PT Diagnosis: Abnormal posture, Abnormality of gait, Cognitive deficits, Coordination disorder, Difficulty walking, Edema, Impaired cognition, Impaired sensation, Muscle weakness and Pain in joint Rehab Potential: Fair ELOS: 10-14 days   Today's Date: 08/04/2014 PT Individual Time: 1430-1530 PT Individual Time Calculation (min): 60 min    Problem List:  Patient Active Problem List   Diagnosis Date Noted  . NSVT (nonsustained ventricular tachycardia) 08/03/2014  . Hip fracture 08/03/2014  . Right renal mass 07/29/2014  . Anemia 07/29/2014  . Pre-operative cardiovascular examination 07/28/2014  . Closed right hip fracture 07/27/2014  . CKD (chronic kidney disease), stage III 07/27/2014  . C. difficile diarrhea 06/08/2014  . Reflux esophagitis 06/06/2014  . Upper GI bleed 06/03/2014  . Pancreatic mass 08/01/2012  . Hepatomegaly 05/07/2012  . CAD-native artery   . GERD 06/20/2010  . DM (diabetes mellitus), type 2, uncontrolled 05/10/2010  . Acute on chronic diastolic heart failure 87/56/4332  . PAH (pulmonary artery hypertension) 07/25/2009  . Chronic diastolic heart failure     Past Medical History:  Past Medical History  Diagnosis Date  . Fibromyalgia   . Hypercholesterolemia   . Hypertension     pulmonary  pap 110/31 (mean 62)by [facility] 9/10 negative PE by chest CT 2010  . Pulmonary edema     Failed Revatio   . ARF (acute renal failure)     poor candidate for ACE -I or ARB  . Restrictive lung disease     obesity hypoventilation syndrome  . Renal insufficiency   . CAD (coronary artery disease)     multivessel DES x 2 RCA 2007  . Chronic diastolic heart failure   . MRSA (methicillin resistant staphylococcus aureus) pneumonia   . Pulmonary hypertension   . Chronic anemia     anemia of chronic disease based on anemia panel 2011  . Pancreatic mass  08/01/2012  . PONV (postoperative nausea and vomiting)   . Heart murmur     "when I was small"  . CHF (congestive heart failure)   . Anginal pain   . Pneumonia X 3  . Type II diabetes mellitus   . Hypothyroidism   . H/O hiatal hernia   . GERD (gastroesophageal reflux disease)   . Anxiety   . On home oxygen therapy     "6L all the time" (02/24/2014   Past Surgical History:  Past Surgical History  Procedure Laterality Date  . Coronary artery bypass graft  1997    LIMA to LAD,SVG to OM  . Esophagogastroduodenoscopy  06/28/10    schatzki ring otherwise normal;small hiatal hernia  . Coronary angioplasty  1993  . Coronary angioplasty with stent placement  ~ 1996; 2008    "think I have 3 stents"  . Abdominal hysterectomy  ~ 1986  . Dilation and curettage of uterus  ~ 1984  . Tubal ligation    . Esophagogastroduodenoscopy N/A 06/06/2014    Procedure: ESOPHAGOGASTRODUODENOSCOPY (EGD);  Surgeon: Lafayette Dragon, MD;  Location: [facility];  Service: Endoscopy;  Laterality: N/A;  . Hip arthroplasty Right 07/29/2014    Procedure: ARTHROPLASTY BIPOLAR HIP;  Surgeon: Mcarthur Rossetti, MD;  Location: [place];  Service: Orthopedics;  Laterality: Right;    Assessment & Plan Clinical Impression: Meagan [name] is a 68 y.o. right-handed female with history of systolic congestive heart failure, severe PAH(pulmonary arterial hypertension) maintained on continuous flofan infusion, fibromyalgia,  COPD with chronic oxygen of 6 L, CAD with CABG and diabetes mellitus with peripheral neuropathy. Independent prior to admission living with her husband and used a walker outside of the home. Presented 07/27/2014 after mechanical fall. Denies loss of consciousness or associated chest pain or shortness or breath associated with fall. X-rays and imaging revealed a right hip displaced and angulated femoral neck fracture. Cardiology followup preoperatively and cleared for surgery. Underwent right total hip arthroplasty  07/29/2014 per Dr. Ninfa Linden. Weightbearing as tolerated with anterior hip precautions. Hospital course pain management. Subcutaneous Lovenox for DVT prophylaxis. Acute blood loss anemia and transfuse 1 unit packed red blood cells with latest hemoglobin 8.9 and monitored. Patient with history of recurrent Clostridium difficile maintained on contact precautions as well as vancomycin 125 mg 4 times a day x14 days initiated 08/01/2014.Marland Kitchen Physical and occupational therapy evaluations completed with recommendations for physical medicine rehabilitation consult. Patient transferred to CIR on 08/03/2014 .   Patient currently requires max with mobility secondary to muscle weakness, decreased cardiorespiratoy endurance and decreased oxygen support, decreased coordination, decreased problem solving, decreased safety awareness, decreased memory and delayed processing and.  Prior to hospitalization, patient was modified independent  with mobility and lived with Spouse in a House home.  Home access is 2 in front, 5 in back with handrail on both sides but can only reach 1, 5 on side with B handrailsStairs to enter.  Patient will benefit from skilled PT intervention to maximize safe functional mobility, minimize fall risk and decrease caregiver burden for planned discharge home with 24 hour assist.  Anticipate patient will benefit from follow up [facility] at discharge.  PT - End of Session Activity Tolerance: Tolerates 10 - 20 min activity with multiple rests Endurance Deficit: Yes Endurance Deficit Description: cardiorespiratory PT Assessment Rehab Potential: Fair Barriers to Discharge: Inaccessible home environment PT Patient demonstrates impairments in the following area(s): Warehouse manager;Behavior;Edema;Endurance;Motor;Pain;Safety;Sensory PT Transfers Functional Problem(s): Bed Mobility;Bed to Chair;Car;Furniture PT Locomotion Functional Problem(s): Ambulation;Wheelchair Mobility;Stairs PT Plan PT Intensity:  Minimum of 1-2 x/day ,45 to 90 minutes PT Frequency: 5 out of 7 days PT Duration Estimated Length of Stay: 10-14 days PT Treatment/Interventions: Ambulation/gait training;Discharge planning;Functional mobility training;Psychosocial support;Therapeutic Activities;Balance/vestibular training;Disease management/prevention;Neuromuscular re-education;Skin care/wound management;Therapeutic Exercise;Wheelchair propulsion/positioning;Cognitive remediation/compensation;DME/adaptive equipment instruction;Pain management;Splinting/orthotics;UE/LE Strength taining/ROM;Community reintegration;Patient/family education;Stair training;UE/LE Coordination activities PT Transfers Anticipated Outcome(s): supervision PT Locomotion Anticipated Outcome(s): supervision PT Recommendation Follow Up Recommendations: Home health PT;24 hour supervision/assistance Equipment Recommended: To be determined Equipment Details: pt has brother's old w/c which she thinks will fit her, but PT asked her to have family bring it in; pt has junior 4WW  Skilled Therapeutic Intervention PT Evaluation: PT completes functional mobility assessment and finds pt to be limited by pain, nausea, and low endurance. Pt is a chronically ill pt who demonstrates a surprising well ability to compensate for her multiple medical problems, in a functional way.   W/C Management: PT instructs pt in self propelling manual w/c x 15' req max A due to pt poor coordination pushing and propelling with B UEs; R UE noted to be mostly wrist propulsion and L UE demonstrates very small stokes.   Therapeutic Activity: PT instructs pt in sit to stand with RW req mod A, stand-step transfer with RW bed to/from w/c req mod A with pt demonstrating an antalgic gait and poor ability to accept weight on R LE, sit to supine req min A, and supine to sit via modified long sit req max A.   Pt will benefit from inpatient  rehab PT to maximize functional independence to prepare for  d/c home.    PT Evaluation Precautions/Restrictions Precautions Precautions: Anterior Hip Restrictions Weight Bearing Restrictions: Yes RLE Weight Bearing: Weight bearing as tolerated General Chart Reviewed: Yes Family/Caregiver Present: Yes Vital SignsTherapy Vitals Temp: 98 F (36.7 C) Temp Source: Oral Pulse Rate: 105 Resp: 18 BP: 123/55 mmHg Patient Position (if appropriate): Sitting Oxygen Therapy SpO2: 100 % O2 Device: Nasal cannula O2 Flow Rate (L/min): 6 L/min Pain Pain Assessment Pain Assessment: 0-10 Pain Score: 8  Pain Type: Surgical pain Pain Location: Hip Pain Orientation: Right Pain Descriptors / Indicators: Throbbing Pain Onset: On-going Pain Intervention(s): Rest;Repositioned Multiple Pain Sites: No Home Living/Prior Functioning Home Living Available Help at Discharge: Family;Available 24 hours/day Type of Home: House Home Access: Stairs to enter [employer] of Steps: 2 in front, 5 in back with handrail on both sides but can only reach 1, 5 on side with B handrails Entrance Stairs-Rails: None Home Layout: One level  Lives With: Spouse Prior Function Level of Independence: Requires assistive device for independence;Needs assistance with ADLs Bath: Supervision/set-up  Able to Take Stairs?: Yes Driving: No Vocation: Retired Comments: Husband assists with bathing due to pump for pulmonary hypertension. Pt has not driven in a year. Can go out in the community when she feels good. On bad days she stays at home in bed mostly.   Cognition Overall Cognitive Status: Within Functional Limits for tasks assessed Arousal/Alertness: Lethargic Orientation Level: Oriented X4 Attention: Focused Focused Attention: Appears intact Memory: Impaired Memory Impairment: Decreased recall of new information Awareness: Appears intact Problem Solving: Impaired Problem Solving Impairment: Functional complex Behaviors: Poor frustration  tolerance Safety/Judgment: Appears intact Sensation Sensation Light Touch: Impaired Detail Light Touch Impaired Details: Impaired RUE;Impaired LUE;Impaired RLE;Impaired LLE (intermittent tingling/burning in hands/feet from peripheral neuropathy) Proprioception: Appears Intact Coordination Gross Motor Movements are Fluid and Coordinated: No Fine Motor Movements are Fluid and Coordinated: Not tested Coordination and Movement Description: antalgic R LE movements Finger Nose Finger Test: wfl, but intention tremor noted bilaterally Motor  Motor Motor: Abnormal postural alignment and control Motor - Skilled Clinical Observations: slouched sitting with head down  Mobility Bed Mobility Bed Mobility: Rolling Left;Supine to Sit;Sit to Supine Supine to Sit: 2: Max assist Supine to Sit Details: Manual facilitation for placement;Verbal cues for technique;Verbal cues for precautions/safety Supine to Sit Details (indicate cue type and reason): not breaking hip precautions Sit to Supine: 4: Min assist Sit to Supine - Details: Manual facilitation for placement Transfers Transfers: Yes Sit to Stand: 3: Mod assist;With armrests Sit to Stand Details: Manual facilitation for placement;Verbal cues for technique Sit to Stand Details (indicate cue type and reason): hand placement Stand Pivot Transfers: 3: Mod assist;With armrests Stand Pivot Transfer Details: Manual facilitation for placement;Verbal cues for technique Stand Pivot Transfer Details (indicate cue type and reason): hand placement Locomotion  Ambulation Ambulation: Yes Ambulation/Gait Assistance: 3: Mod assist Ambulation Distance (Feet): 2 Feet Assistive device: Rolling walker Ambulation/Gait Assistance Details: Manual facilitation for placement;Verbal cues for sequencing;Verbal cues for precautions/safety Ambulation/Gait Assistance Details: turning the walker and not sitting too early Gait Gait: Yes Gait Pattern: Impaired Gait  Pattern: Antalgic;Lateral hip instability;Trunk flexed;Wide base of support Stairs / Additional Locomotion Stairs: No Architect: Yes Wheelchair Assistance: 2: Max Technical sales engineer Details: Multimedia programmer for placement;Verbal cues for Marketing executive: Both upper extremities Wheelchair Parts Management: Needs assistance Distance: 15  Trunk/Postural Assessment  Cervical Assessment Cervical Assessment: Exceptions to [facility] Cervical AROM Overall  Cervical AROM: Deficits;Due to premorid status Overall Cervical AROM Comments: limited extension due to chronic down head posture Thoracic Assessment Thoracic Assessment: Exceptions to [facility] Thoracic AROM Overall Thoracic AROM: Deficits;Due to premorid status Overall Thoracic AROM Comments: flexible kyphosis due to chronic slouched positioning Lumbar Assessment Lumbar Assessment: Within Functional Limits Postural Control Postural Control: Deficits on evaluation Head Control: head down Trunk Control: kyphosis Postural Limitations: chronic sacral/slouched sitting with head down  Balance Balance Balance Assessed: Yes Static Sitting Balance Static Sitting - Balance Support: Bilateral upper extremity supported;Feet supported Static Sitting - Level of Assistance: 5: Stand by assistance Dynamic Sitting Balance Dynamic Sitting - Balance Support: Bilateral upper extremity supported;Feet supported;During functional activity Dynamic Sitting - Level of Assistance: 4: Min Insurance risk surveyor Standing - Balance Support: Bilateral upper extremity supported;During functional activity Static Standing - Level of Assistance: 4: Min assist Dynamic Standing Balance Dynamic Standing - Balance Support: Bilateral upper extremity supported;During functional activity Dynamic Standing - Level of Assistance: 3: Mod assist Dynamic Standing - Balance Activities: Lateral lean/weight  shifting Extremity Assessment  RUE Assessment RUE Assessment: Within Functional Limits LUE Assessment LUE Assessment: Within Functional Limits RLE Assessment RLE Assessment: Exceptions to [facility] RLE Strength RLE Overall Strength: Deficits;Due to pain;Due to precautions RLE Overall Strength Comments: hip flexion 2+/5, hip abduction/adduction NT, hip extension >= 1/5, knee extension 2+/5, knee flexion 2-/5, ankle DF 4/5 LLE Assessment LLE Assessment: Within Functional Limits  FIM:  FIM - Control and instrumentation engineer Devices: Walker;Arm rests Bed/Chair Transfer: 2: Supine > Sit: Max A (lifting assist/Pt. 25-49%);4: Sit > Supine: Min A (steadying pt. > 75%/lift 1 leg);3: Bed > Chair or W/C: Mod A (lift or lower assist);3: Chair or W/C > Bed: Mod A (lift or lower assist) FIM - Locomotion: Wheelchair Distance: 15 Locomotion: Wheelchair: 1: Travels less than 50 ft with maximal assistance (Pt: 25 - 49%) FIM - Locomotion: Ambulation Locomotion: Ambulation Assistive Devices: Administrator Ambulation/Gait Assistance: 3: Mod assist Locomotion: Ambulation: 1: Travels less than 50 ft with moderate assistance (Pt: 50 - 74%) FIM - Locomotion: Stairs Locomotion: Stairs: 0: Activity did not occur   Refer to Care Plan for Long Term Goals  Recommendations for other services: None  Discharge Criteria: Patient will be discharged from PT if patient refuses treatment 3 consecutive times without medical reason, if treatment goals not met, if there is a change in medical status, if patient makes no progress towards goals or if patient is discharged from hospital.  The above assessment, treatment plan, treatment alternatives and goals were discussed and mutually agreed upon: by patient and by family  [facility] M 08/04/2014, 4:47 PM

## 2014-08-04 NOTE — Progress Notes (Signed)
*  PRELIMINARY RESULTS* Vascular Ultrasound Lower extremity venous duplex has been completed.  Preliminary findings: no evidence of DVT.  Landry Mellow, RDMS, RVT  08/04/2014, 11:02 AM

## 2014-08-04 NOTE — Evaluation (Signed)
Evaluation note and session notes reviewed and all accurately reflect assessment & plan and treatment sessions.

## 2014-08-04 NOTE — Progress Notes (Signed)
[facility] Admission Coordinator Signed Physical Medicine and Rehabilitation PMR Pre-admission Service date: 08/03/2014 2:00 PM  Related encounter: Admission (Discharged) from 07/27/2014 in [place] [facility]   PMR Admission Coordinator Pre-Admission Assessment  Patient: Meagan [name] is an 68 y.o., female  MRN: [id number]  DOB: 08/24/1946  Height: 5' (152.4 cm)  Weight: 66.2 kg (145 lb 15.1 oz)  Insurance Information  HMO: PPO: PCP: IPA: 80/20: OTHER:  PRIMARY: Medicare A & B Policy#: [id number] a Subscriber: self  Pre-Cert#: verified in [employer]: retired  Runner, broadcasting/film/video. Date: A: 07-22-98; B: 12-20-09 Deduct: $1260 Out of Pocket Max: none Life Max: unlimited  CIR: 100% SNF: 100% days 1-20; 80% days 21-100 (100 day visit max)  Outpatient: 80% Co-Pay: 20%  Home Health: 100% Co-Pay: none  DME: 80% Co-Pay: 20%  Providers: pt's preference   SECONDARY: [employer] Policy#: [id number] Subscriber: self  Benefits: Phone #: [phone]  Emergency Contact Information    Contact Information     Name  Relation  Home  Work  [place]  Spouse  [phone]   [phone]     Abigail Butts    [phone]        Current Medical History  Patient Admitting Diagnosis: right femoral neck fracture  History of Present Illness: Meagan [name] is a 68 y.o. right-handed female with history of systolic congestive heart failure, severe PAH(pulmonary arterial hypertension) maintained on continuous flofan infusion, fibromyalgia, COPD with chronic oxygen, CAD with CABG and diabetes mellitus with peripheral neuropathy. Independent prior to admission living with her husband and used a walker outside of the home. Presented 07/27/2014 after mechanical fall. Denies loss of consciousness or associated chest pain or shortness or breath associated with fall. X-rays and imaging revealed a right hip displaced and angulated femoral neck fracture. Cardiology followup  preoperatively and cleared for surgery. Underwent 8 total hip arthroplasty 07/29/2014 per Dr. Ninfa Linden. Weightbearing as tolerated with anterior hip cautions. Hospital course pain management. Subcutaneous Lovenox for DVT prophylaxis. Acute blood loss anemia 7.4 and monitored. Patient with recent history of Clostridium difficile maintained on contact precautions. Physical therapy evaluation completed with recommendations for physical medicine rehabilitation consult.  Past Medical History    Past Medical History    Diagnosis  Date    .  Fibromyalgia     .  Hypercholesterolemia     .  Hypertension       pulmonary pap 110/31 (mean 62)by [facility] 9/10 negative PE by chest CT 2010    .  Pulmonary edema       Failed Revatio    .  ARF (acute renal failure)       poor candidate for ACE -I or ARB    .  Restrictive lung disease       obesity hypoventilation syndrome    .  Renal insufficiency     .  CAD (coronary artery disease)       multivessel DES x 2 RCA 2007    .  Chronic diastolic heart failure     .  MRSA (methicillin resistant staphylococcus aureus) pneumonia     .  Pulmonary hypertension     .  Chronic anemia       anemia of chronic disease based on anemia panel 2011    .  Pancreatic mass  08/01/2012    .  PONV (postoperative nausea and vomiting)     .  Heart murmur       "  when I was small"    .  CHF (congestive heart failure)     .  Anginal pain     .  Pneumonia  X 3    .  Type II diabetes mellitus     .  Hypothyroidism     .  H/O hiatal hernia     .  GERD (gastroesophageal reflux disease)     .  Anxiety     .  On home oxygen therapy       "6L all the time" (02/24/2014     Family History  family history includes COPD in her brother; Cancer in her mother; Heart attack in her father; Heart disease in her brother.  Prior Rehab/Hospitalizations: Pt was hospitalized in late Aug/Sept. 2015 and had follow up home health at that time. "She never got her strength back" per husband. Pt also had  pulmonary rehab at [facility].  Current Medications  Current facility-administered medications:acetaminophen (TYLENOL) suppository 650 mg, 650 mg, Rectal, Q6H PRN, Mcarthur Rossetti, MD, 650 mg at 07/30/14 1003; acetaminophen (TYLENOL) tablet 650 mg, 650 mg, Oral, Q6H PRN, Mcarthur Rossetti, MD, 650 mg at 07/30/14 1616; ALPRAZolam Duanne Moron) tablet 1 mg, 1 mg, Oral, QHS, Verlee Monte, MD, 1 mg at 08/02/14 2218  aspirin EC tablet 81 mg, 81 mg, Oral, Daily, Verlee Monte, MD, 81 mg at 08/03/14 1020; DULoxetine (CYMBALTA) DR capsule 30 mg, 30 mg, Oral, QHS, Verlee Monte, MD, 30 mg at 08/02/14 2217; DULoxetine (CYMBALTA) DR capsule 60 mg, 60 mg, Oral, Daily, Verlee Monte, MD, 60 mg at 08/03/14 1019; enoxaparin (LOVENOX) injection 40 mg, 40 mg, Subcutaneous, Q24H, Mcarthur Rossetti, MD, 40 mg at 08/02/14 7893  epoprostenol 4,500 mcg in glycine diluent for flolan 100 mL (45 mcg/mL) infusion, 33.45 ng/kg/min (Order-Specific), Intravenous, Continuous, Georgina Peer, [facility], Last Rate: 3.17 mL/hr at 07/28/14 1855, 33.45 ng/kg/min at 07/28/14 1855; ezetimibe-simvastatin (VYTORIN) 10-40 MG per tablet 1 tablet, 1 tablet, Oral, QHS, Bobby Rumpf York, PA-C, 1 tablet at 08/02/14 2218  furosemide (LASIX) tablet 80 mg, 80 mg, Oral, Daily, Amy D Clegg, NP, 80 mg at 08/03/14 1019; hydrALAZINE (APRESOLINE) injection 10 mg, 10 mg, Intravenous, Q6H PRN, Bobby Rumpf York, PA-C; HYDROmorphone (DILAUDID) injection 1 mg, 1 mg, Intravenous, Q3H PRN, Domenic Polite, MD, 1 mg at 08/03/14 0835; HYDROmorphone (DILAUDID) tablet 4 mg, 4 mg, Oral, BID, Domenic Polite, MD, 4 mg at 08/03/14 1019  ice pack MISC 1 each, 1 each, Other, 4 times per day, Verlee Monte, MD, 1 each at 08/03/14 0600; insulin aspart (novoLOG) injection 0-15 Units, 0-15 Units, Subcutaneous, TID WC, Wilhelmina Mcardle, MD, 5 Units at 08/03/14 1153; insulin aspart (novoLOG) injection 0-5 Units, 0-5 Units, Subcutaneous, QHS, Wilhelmina Mcardle, MD, 3 Units at  07/30/14 2136  insulin glargine (LANTUS) injection 8 Units, 8 Units, Subcutaneous, QHS, Domenic Polite, MD, 8 Units at 08/02/14 2218; isosorbide mononitrate (IMDUR) 24 hr tablet 90 mg, 90 mg, Oral, q morning - 10a, Verlee Monte, MD, 90 mg at 08/03/14 1020; levothyroxine (SYNTHROID, LEVOTHROID) tablet 25 mcg, 25 mcg, Oral, QAC breakfast, Verlee Monte, MD, 25 mcg at 08/03/14 8101  menthol-cetylpyridinium (CEPACOL) lozenge 3 mg, 1 lozenge, Oral, PRN, Mcarthur Rossetti, MD; metoprolol (LOPRESSOR) injection 2.5-5 mg, 2.5-5 mg, Intravenous, Q3H PRN, Wilhelmina Mcardle, MD, 2.5 mg at 07/30/14 1016; metoprolol succinate (TOPROL-XL) 24 hr tablet 100 mg, 100 mg, Oral, Daily, Verlee Monte, MD, 100 mg at 08/03/14 1020; nitroGLYCERIN (NITROSTAT) SL tablet 0.4 mg, 0.4 mg, Sublingual, Q5  min PRN, Verlee Monte, MD  ondansetron [facility]) injection 4 mg, 4 mg, Intravenous, Q6H PRN, Mcarthur Rossetti, MD, 4 mg at 08/01/14 1245; ondansetron (ZOFRAN) tablet 4 mg, 4 mg, Oral, Q6H PRN, Mcarthur Rossetti, MD; ondansetron [facility]) tablet 8 mg, 8 mg, Oral, BID, Verlee Monte, MD, 8 mg at 08/03/14 1020; oxyCODONE-acetaminophen (PERCOCET/ROXICET) 5-325 MG per tablet 1 tablet, 1 tablet, Oral, Q4H PRN, Rush Farmer, MD, 1 tablet at 08/03/14 1356  pantoprazole (PROTONIX) EC tablet 40 mg, 40 mg, Oral, Q1200, Wilhelmina Mcardle, MD, 40 mg at 08/03/14 1153; phenol (CHLORASEPTIC) mouth spray 1 spray, 1 spray, Mouth/Throat, PRN, Mcarthur Rossetti, MD; potassium chloride SA (K-DUR,KLOR-CON) CR tablet 40 mEq, 40 mEq, Oral, Daily, Melton Alar, PA-C, 40 mEq at 08/03/14 1020; sodium chloride (OCEAN) 0.65 % nasal spray 1 spray, 1 spray, Each Nare, PRN, Jonetta Osgood, MD  vancomycin (VANCOCIN) 50 mg/mL oral solution 125 mg, 125 mg, Oral, 4 times per day, Domenic Polite, MD, 125 mg at 08/03/14 1153  Patients Current Diet: heart healthy, carb modified  Precautions / Restrictions  Precautions  Precautions: Anterior Hip   Precaution Booklet Issued: No  Restrictions  Weight Bearing Restrictions: No  RUE Weight Bearing: Weight bearing as tolerated  LUE Weight Bearing: Weight bearing as tolerated  RLE Weight Bearing: Weight bearing as tolerated  LLE Weight Bearing: Weight bearing as tolerated  Prior Activity Level  Household: Pt mainly stayed at home and has not driven in over a year (per her personal choice). Husband runs the errands and she got out on limited apporintments. Pt hasn't been feeling well since her last hospital admit in [place] / [place] Devices/Equipment: Gilford Rile (specify type);Cane (specify quad or straight)  Home Equipment: Walker - 4 wheels;Cane - single point;Shower seat - built in;Bedside commode;Electric scooter;Grab bars - tub/shower  Prior Functional Level  Prior Function  Level of Independence: Independent  Comments: Husband assists with bathing due to pump for pulmonary hypertension. Pt has not driven in a year. Can go out in the community when she feels good. On bad days she stays at home in bed mostly.  Current Functional Level    Cognition  Overall Cognitive Status: Impaired/Different from baseline  Current Attention Level: Selective  Orientation Level: Oriented X4  Safety/Judgement: Decreased awareness of safety;Decreased awareness of deficits  General Comments: Pt appears to have impaired cognition and unknown whether this is baseline deficit or acute.    Extremity Assessment  (includes Sensation/Coordination)      ADLs  Overall ADL's : Needs assistance/impaired  Grooming: Set up;Supervision/safety;Sitting  Grooming Details (indicate cue type and reason): Next session will focus on using Steady with pt at sink for grooming activities to address standing tolerance.  Upper Body Bathing: Moderate assistance;Sitting  Lower Body Bathing: Total assistance;Sit to/from stand  Upper Body Dressing : Moderate assistance;Sitting  Lower Body  Dressing: Total assistance;Sit to/from stand  Toilet Transfer: +2 for physical assistance;Total assistance;BSC  Toileting- Clothing Manipulation and Hygiene: +2 for physical assistance;Sit to/from stand (+3 required to clean bottom in standing.)  Functional mobility during ADLs: +2 for physical assistance;Total assistance;Rolling walker;Cueing for sequencing;Cueing for safety  General ADL Comments: OT observed pt with PT. Pt with improved function with use of steady and would benefit from it's use during ADLs at the sink and for transfers to [facility] (rather than bed pan). Discussed this with pt and husband and will notify RN. Following PT session, pt participated in UE strengthening  exercises in sitting.    Mobility  Overal bed mobility: Needs Assistance  Bed Mobility: Supine to Sit  Supine to sit: +2 for physical assistance;Max assist  Sit to supine: Max assist;+2 for physical assistance  General bed mobility comments: Assist to bring legs over, elevate trunk into sitting and bringing hips to EOB.    Transfers  Overall transfer level: Needs assistance  Equipment used: Rolling walker (2 wheeled);Ambulation equipment used Charlaine Dalton)  Transfers: Sit to/from [facility]  Sit to Stand: +2 physical assistance;Mod assist  Stand pivot transfers: +2 physical assistance;Mod assist  General transfer comment: Observed pt with PT. Pt with improved mobility using Stedy and would benefit from further use with therapists and RN.    Ambulation / Gait / Stairs / Wheelchair Mobility  Ambulation/Gait  Ambulation/Gait assistance: Mod assist;+2 physical assistance  Ambulation Distance (Feet): 3 Feet  Assistive device: Rolling walker (2 wheeled)  Gait Pattern/deviations: Step-to pattern;Decreased step length - left;Decreased stance time - right;Trunk flexed;Antalgic  Gait velocity interpretation: Below normal speed for age/gender  General Gait Details: Pt with difficulty stepping with LLE due to inability  to bear weight on RLE. Verbal cues to put weight through arms onto walker.    Posture / Balance  Dynamic Sitting Balance  Sitting balance - Comments: Posterior and left lean requiring assist even with BUE support.    Special needs/care consideration  BiPAP/CPAP no  CPM no  Continuous Drip IV no  Dialysis no  Life Vest no  Oxygen - currently on 6L O2 and wears 6L home O2 at baseline  Special Bed no  Trach Size no  Wound Vac (area) no  Skin - pt with current R hip incision (bandaged) and with R chest port  Bowel mgmt: last BM on 08-02-14, on contact precautions for enteric precautions. Some diarrhea at times.  Bladder mgmt: currently using bedpan  Diabetic mgmt - managed at home with insulin  Note: pt with continuous medication catheter/pump to port with Flolan medicine (for pulmonary hypertension). Husband knows how to change this and refill the Flolan when needed. Husband to refill the pump later on 08-03-14.    Previous Home Environment  Living Arrangements: Spouse/significant other  Available Help at Discharge: Family;Available 24 hours/day  Type of Home: House  Home Layout: One level  Home Access: Stairs to enter  Entrance Stairs-Rails: None  Entrance Stairs-Number of Steps: 2  Bathroom Shower/Tub: Tourist information centre manager: Standard  Bathroom Accessibility: Yes  How Accessible: Accessible via walker  Home Care Services: No  Discharge Living Setting  Plans for Discharge Living Setting: Patient's home  Type of Home at Discharge: House  Discharge Home Layout: One level  Discharge Home Access: Stairs to enter  Entrance Stairs-Rails: (no rails at front, bil. rails at back door)  Entrance Stairs-Number of Steps: 2 STE at front door, 8 STE at back door to back porch  Does the patient have any problems obtaining your medications?: No  Social/Family/Support Systems  Patient Roles: Spouse  Contact Information: husband Fritz Pickerel is primary contact  Anticipated Caregiver: husband  or sisters  Anticipated Caregiver's Contact Information: see above  Ability/Limitations of Caregiver: no limitations from husband. Sisters can also be with pt if husband has to go out.  Caregiver Availability: 24/7  Discharge Plan Discussed with Primary Caregiver: Yes  Is Caregiver In Agreement with Plan?: Yes  Does Caregiver/Family have Issues with Lodging/Transportation while Pt is in Rehab?: No  Goals/Additional Needs  Patient/Family Goal for Rehab: Supervision and Min  Assist with PT/OT; NA for SLP  Expected length of stay: 10-14 days  Cultural Considerations: none  Dietary Needs: heart healthy, carb modified  Equipment Needs: to be determined  Pt/Family Agrees to Admission and willing to participate: Yes (spoke with pt and her husband)  Program Orientation Provided & Reviewed with Pt/Caregiver Including Roles & Responsibilities: Yes  Decrease burden of Care through IP rehab admission: NA  Possible need for SNF placement upon discharge: not anticipated  Patient Condition: This patient's condition remains as documented in the consult dated 08-02-14, in which the Rehabilitation Physician determined and documented that the patient's condition is appropriate for intensive rehabilitative care in an inpatient rehabilitation facility. Will admit to inpatient rehab today.  Preadmission Screen Completed By: Nanetta Batty, PT, 08/03/2014 2:00 PM  ______________________________________________________________________  Discussed status with Dr. Naaman Plummer on 08/03/14 at 1400 and received telephone approval for admission today.  Admission Coordinator: Nanetta Batty, PT, time 1400/Date10/13/15    Cosigned by: Meredith Staggers, MD [08/03/2014 4:21 PM]

## 2014-08-05 ENCOUNTER — Inpatient Hospital Stay (HOSPITAL_COMMUNITY): Payer: Medicare Other | Admitting: Occupational Therapy

## 2014-08-05 ENCOUNTER — Encounter (HOSPITAL_COMMUNITY): Payer: Medicare Other | Admitting: Occupational Therapy

## 2014-08-05 ENCOUNTER — Inpatient Hospital Stay (HOSPITAL_COMMUNITY): Payer: Medicare Other

## 2014-08-05 LAB — URINALYSIS, ROUTINE W REFLEX MICROSCOPIC
Bilirubin Urine: NEGATIVE
GLUCOSE, UA: NEGATIVE mg/dL
KETONES UR: NEGATIVE mg/dL
Nitrite: NEGATIVE
PROTEIN: NEGATIVE mg/dL
Specific Gravity, Urine: 1.015 (ref 1.005–1.030)
Urobilinogen, UA: 0.2 mg/dL (ref 0.0–1.0)
pH: 6.5 (ref 5.0–8.0)

## 2014-08-05 LAB — URINE MICROSCOPIC-ADD ON

## 2014-08-05 LAB — GLUCOSE, CAPILLARY
GLUCOSE-CAPILLARY: 135 mg/dL — AB (ref 70–99)
GLUCOSE-CAPILLARY: 183 mg/dL — AB (ref 70–99)
GLUCOSE-CAPILLARY: 130 mg/dL — AB (ref 70–99)
Glucose-Capillary: 207 mg/dL — ABNORMAL HIGH (ref 70–99)

## 2014-08-05 MED ORDER — ICE PACK FOR EPOPROSTENOL (FLOLAN) INFUSION
1.0000 | Freq: Three times a day (TID) | Status: DC
  Administered 2014-08-05 – 2014-08-17 (×28): 1
  Filled 2014-08-05 (×41): qty 1

## 2014-08-05 MED ORDER — ALUM & MAG HYDROXIDE-SIMETH 200-200-20 MG/5ML PO SUSP
30.0000 mL | Freq: Four times a day (QID) | ORAL | Status: DC | PRN
  Administered 2014-08-05 – 2014-08-06 (×2): 30 mL via ORAL
  Filled 2014-08-05 (×3): qty 30

## 2014-08-05 NOTE — Progress Notes (Addendum)
68 y.o. right-handed female with history of systolic congestive heart failure, severe PAH(pulmonary arterial hypertension) maintained on continuous flofan infusion, fibromyalgia, COPD with chronic oxygen of 6 L, CAD with CABG and diabetes mellitus with peripheral neuropathy. Independent prior to admission living with her husband and used a walker outside of the home. Presented 07/27/2014 after mechanical fall. Denies loss of consciousness or associated chest pain or shortness or breath associated with fall. X-rays and imaging revealed a right hip displaced and angulated femoral neck fracture. Cardiology followup preoperatively and cleared for surgery. Underwent right total hip arthroplasty 07/29/2014 per Dr. Ninfa Linden. Weightbearing as tolerated with anterior hip precautions. Hospital course pain management. Subcutaneous Lovenox for DVT prophylaxis. Acute blood loss anemia and transfuse   Subjective/Complaints: Complaining of no breakfast due to back to back therapy 730a-930a Review of Systems - Respiratory ROS: no cough, shortness of breath, or wheezing positive for - "non specific breathing problem" Objective: Vital Signs: Blood pressure 131/59, pulse 101, temperature 98 F (36.7 C), temperature source Oral, resp. rate 18, weight 68.448 kg (150 lb 14.4 oz), SpO2 100.00%. No results found. Results for orders placed during the hospital encounter of 08/03/14 (from the past 72 hour(s))  GLUCOSE, CAPILLARY     Status: Abnormal   Collection Time    08/03/14  8:55 PM      Result Value Ref Range   Glucose-Capillary 185 (*) 70 - 99 mg/dL  CBC WITH DIFFERENTIAL     Status: Abnormal   Collection Time    08/04/14  6:22 AM      Result Value Ref Range   WBC 15.8 (*) 4.0 - 10.5 K/uL   Comment: WHITE COUNT CONFIRMED ON SMEAR   RBC 3.21 (*) 3.87 - 5.11 MIL/uL   Hemoglobin 9.3 (*) 12.0 - 15.0 g/dL   HCT 26.9 (*) 36.0 - 46.0 %   MCV 83.8  78.0 - 100.0 fL   MCH 29.0  26.0 - 34.0 pg   MCHC 34.6  30.0 - 36.0  g/dL   RDW 13.7  11.5 - 15.5 %   Platelets 304  150 - 400 K/uL   Comment: PLATELET COUNT CONFIRMED BY SMEAR   Neutrophils Relative % 83 (*) 43 - 77 %   Lymphocytes Relative 8 (*) 12 - 46 %   Monocytes Relative 6  3 - 12 %   Eosinophils Relative 3  0 - 5 %   Basophils Relative 0  0 - 1 %   Neutro Abs 13.1 (*) 1.7 - 7.7 K/uL   Lymphs Abs 1.3  0.7 - 4.0 K/uL   Monocytes Absolute 0.9  0.1 - 1.0 K/uL   Eosinophils Absolute 0.5  0.0 - 0.7 K/uL   Basophils Absolute 0.0  0.0 - 0.1 K/uL  COMPREHENSIVE METABOLIC PANEL     Status: Abnormal   Collection Time    08/04/14  6:22 AM      Result Value Ref Range   Sodium 133 (*) 137 - 147 mEq/L   Potassium 4.4  3.7 - 5.3 mEq/L   Comment: HEMOLYSIS AT THIS LEVEL MAY AFFECT RESULT   Chloride 92 (*) 96 - 112 mEq/L   CO2 29  19 - 32 mEq/L   Glucose, Bld 135 (*) 70 - 99 mg/dL   BUN 15  6 - 23 mg/dL   Creatinine, Ser 0.81  0.50 - 1.10 mg/dL   Calcium 8.8  8.4 - 10.5 mg/dL   Total Protein 5.9 (*) 6.0 - 8.3 g/dL   Albumin 2.0 (*)  3.5 - 5.2 g/dL   AST 13  0 - 37 U/L   Comment: HEMOLYSIS AT THIS LEVEL MAY AFFECT RESULT   ALT <5  0 - 35 U/L   Comment: HEMOLYSIS AT THIS LEVEL MAY AFFECT RESULT   Alkaline Phosphatase 101  39 - 117 U/L   Total Bilirubin 0.4  0.3 - 1.2 mg/dL   GFR calc non Af Amer 72 (*) >90 mL/min   GFR calc Af Amer 84 (*) >90 mL/min   Comment: (NOTE)     The eGFR has been calculated using the CKD EPI equation.     This calculation has not been validated in all clinical situations.     eGFR's persistently <90 mL/min signify possible Chronic Kidney     Disease.   Anion gap 12  5 - 15  GLUCOSE, CAPILLARY     Status: Abnormal   Collection Time    08/04/14 11:59 AM      Result Value Ref Range   Glucose-Capillary 159 (*) 70 - 99 mg/dL  GLUCOSE, CAPILLARY     Status: Abnormal   Collection Time    08/04/14  3:35 PM      Result Value Ref Range   Glucose-Capillary 286 (*) 70 - 99 mg/dL  GLUCOSE, CAPILLARY     Status: Abnormal    Collection Time    08/04/14  9:05 PM      Result Value Ref Range   Glucose-Capillary 166 (*) 70 - 99 mg/dL   Comment 1 Notify RN    GLUCOSE, CAPILLARY     Status: Abnormal   Collection Time    08/05/14  6:49 AM      Result Value Ref Range   Glucose-Capillary 135 (*) 70 - 99 mg/dL      Constitutional: She is oriented to person, place, and time. She appears well-developed.  HENT: oral mucosa moist, dentition fair  Head: Normocephalic.  Eyes: EOM are normal.  Neck: Normal range of motion. Neck supple. No thyromegaly present.  Cardiovascular: Normal rate and regular rhythm.  Respiratory: Breath sounds normal. No respiratory distress.  GI: Soft. Bowel sounds are normal. She exhibits no distension.  Neurological: She is alert and oriented to person, place, and time. Fair insight and awareness.  Follows three-step commands. HOH sometimes limiting. UES: 4+ to 5/5. SRP:RXYVOPF due to pain, 1/5 hf, 1+ KE and 4- ADF/APF. LLE grossly 3+ to 4+/5 prox to distal. No gross sensory findings.  Skin:  Hip incision clean and intact with staples.  M/S: right hip with mild edema. Area very sensitive to touch.  Psychiatric:  Affect flat, does not initiate conversation, sometimes anxious   Assessment/Plan: 1. Functional deficits secondary to Right Femoral neck fracture which require 3+ hours per day of interdisciplinary therapy in a comprehensive inpatient rehab setting. Physiatrist is providing close team supervision and 24 hour management of active medical problems listed below. Physiatrist and rehab team continue to assess barriers to discharge/monitor patient progress toward functional and medical goals. FIM: FIM - Bathing Bathing Steps Patient Completed: Chest;Right Arm;Left Arm;Abdomen;Right upper leg;Left upper leg;Front perineal area;Buttocks Bathing: 4: Steadying assist (at EOB, 2 sit<>stand t/f)  FIM - Upper Body Dressing/Undressing Upper body dressing/undressing steps patient completed:  Thread/unthread right sleeve of pullover shirt/dresss;Thread/unthread left sleeve of pullover shirt/dress;Put head through opening of pull over shirt/dress;Pull shirt over trunk Upper body dressing/undressing: 5: Set-up assist to: Obtain clothing/put away FIM - Lower Body Dressing/Undressing Lower body dressing/undressing: 1: Total-Patient completed less than 25% of tasks  FIM - Radio producer Devices: Grab bars Toilet Transfers: 3-To toilet/BSC: Mod A (lift or lower assist);3-From toilet/BSC: Mod A (lift or lower assist)  FIM - Control and instrumentation engineer Devices: Walker;Arm rests Bed/Chair Transfer: 2: Supine > Sit: Max A (lifting assist/Pt. 25-49%);4: Sit > Supine: Min A (steadying pt. > 75%/lift 1 leg);3: Bed > Chair or W/C: Mod A (lift or lower assist);3: Chair or W/C > Bed: Mod A (lift or lower assist)  FIM - Locomotion: Wheelchair Distance: 15 Locomotion: Wheelchair: 1: Travels less than 50 ft with maximal assistance (Pt: 25 - 49%) FIM - Locomotion: Ambulation Locomotion: Ambulation Assistive Devices: Administrator Ambulation/Gait Assistance: 3: Mod assist Locomotion: Ambulation: 1: Travels less than 50 ft with moderate assistance (Pt: 50 - 74%)  Comprehension Comprehension Mode: Auditory Comprehension: 5-Follows basic conversation/direction: With extra time/assistive device  Expression Expression Mode: Verbal Expression: 6-Expresses complex ideas: With extra time/assistive device  Social Interaction Social Interaction: 6-Interacts appropriately with others with medication or extra time (anti-anxiety, antidepressant).  Problem Solving Problem Solving: 4-Solves basic 75 - 89% of the time/requires cueing 10 - 24% of the time  Memory Memory: 3-Recognizes or recalls 50 - 74% of the time/requires cueing 25 - 49% of the time  Medical Problem List and Plan:  1. Functional deficits secondary to displaced right femoral neck  fracture. 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 Weightbearing as tolerated with anterior hip precautions  2. DVT Prophylaxis/Anticoagulation: Subcutaneous Lovenox. Monitor platelet counts and any signs of bleeding. Check vascular study  3. Pain Management: Oxycodone as needed. Monitor with increased mobility  4. Mood/anxiety/depression: Xanax 1 mg each bedtime. Provide emotional support  5. Neuropsych: This patient is capable of making decisions on her own behalf.  6. Skin/Wound Care: Routine skin checks  7. Fluids/Electrolytes/Nutrition: Followup chemistries was strict I and Os. Provide nutritional supplements as needed  8. Severe pulmonary arterial hypertension. Continue Flolan per cardiology services as well as Toprol-XL 100 mg daily, Imdur 90 mg daily, Lasix 80 mg daily. Monitor with increased mobility  9. Diabetes mellitus with peripheral neuropathy. Latest hemoglobin A1c 6.3. Lantus insulin 8 units each bedtime. Check blood sugars a.c. and at bedtime  10. Hypothyroidism. Continue Synthroid  11. Hyperlipidemia.Vytorin  12. Recurrent Clostridium difficile. Contact precautions. Vancomycin 125 mg 4 times a day x14 days initiated 08/01/2014  13. COPD/chronic respiratory failure. Oxygen therapy as directed    LOS (Days) 2 A FACE TO FACE EVALUATION WAS PERFORMED  Shakeia Krus E 08/05/2014, 9:57 AM

## 2014-08-05 NOTE — Progress Notes (Signed)
Note reviewed and accurately reflects treatment session.   

## 2014-08-05 NOTE — Progress Notes (Signed)
Occupational Therapy Session Note  Patient Details  Name: Meagan [name] MRN: [id number] Date of Birth: 1946-07-17  Today's Date: 08/05/2014 OT Individual Time: 7:30-8:30 OT Calculated Individual Time (min): 60 min  Short Term Goals: Week 1:  OT Short Term Goal 1 (Week 1): Pt will complete UB/LB dressing using AE prn w/ min A. OT Short Term Goal 2 (Week 1): Pt will complete UB/LB bathing using AE prn w/ min A. OT Short Term Goal 3 (Week 1): Pt will complete toilet t/f onto 3-in-1 commode w/ min A. OT Short Term Goal 4 (Week 1): Pt will be educated on UE strengthening and ROM HEP.  Skilled Therapeutic Interventions/Progress Updates:  Pt supine in bed and asleep when arriving, arousing easily. Husband in room and pt reporting 6/10 pain in R hip, reporting that she had received meds 30 min before. Noted pt w/ more difficulty hearing than during previous sessions. Pt educated on positioning of RLE for following hip precautions and t/f to EOB. Pt t/f to w/c and self-propelled w/c to sink, w/ education on how to change directions in w/c. Pt performed UB/LB B&D at sink w/ 3 sit<>stand t/f. Pt completed tooth brushing while standing at sink, w/ mod A for maintaining standing balance. Pt performed hair brushing, applied deodorant and applied lotion while seated in w/c. Pt requiring frequent rest breaks throughout session for "catching breath" but initiated and participated more in activities than in previous sessions. Husband questioning why pt needed to work on bathing and dressing when all she needed to do was "work on walking," therapist educated husband and pt on value of skilled OT services for increasing endurance, strength, and overall independence in functional activities that she will perform when she is d/c home. Pt seated in w/c w/ all needs nearby and husband in room when leaving.  Therapy Documentation Precautions:  Precautions Precautions: Anterior Hip Restrictions Weight Bearing  Restrictions: Yes RLE Weight Bearing: Weight bearing as tolerated  See FIM for current functional status  Therapy/Group: Individual Therapy  Stevee Valenta Raynell 08/05/2014, 10:45 AM

## 2014-08-05 NOTE — Progress Notes (Signed)
Social Work  Social Work Assessment and Plan  Patient Details  Name: Meagan [name] MRN: [id number] Date of Birth: Nov 27, 1945  Today's Date: 08/05/2014  Problem List:  Patient Active Problem List   Diagnosis Date Noted  . NSVT (nonsustained ventricular tachycardia) 08/03/2014  . Hip fracture 08/03/2014  . Right renal mass 07/29/2014  . Anemia 07/29/2014  . Pre-operative cardiovascular examination 07/28/2014  . Closed right hip fracture 07/27/2014  . CKD (chronic kidney disease), stage III 07/27/2014  . C. difficile diarrhea 06/08/2014  . Reflux esophagitis 06/06/2014  . Upper GI bleed 06/03/2014  . Pancreatic mass 08/01/2012  . Hepatomegaly 05/07/2012  . CAD-native artery   . GERD 06/20/2010  . DM (diabetes mellitus), type 2, uncontrolled 05/10/2010  . Acute on chronic diastolic heart failure 08/15/8526  . PAH (pulmonary artery hypertension) 07/25/2009  . Chronic diastolic heart failure    Past Medical History:  Past Medical History  Diagnosis Date  . Fibromyalgia   . Hypercholesterolemia   . Hypertension     pulmonary  pap 110/31 (mean 62)by [facility] 9/10 negative PE by chest CT 2010  . Pulmonary edema     Failed Revatio   . ARF (acute renal failure)     poor candidate for ACE -I or ARB  . Restrictive lung disease     obesity hypoventilation syndrome  . Renal insufficiency   . CAD (coronary artery disease)     multivessel DES x 2 RCA 2007  . Chronic diastolic heart failure   . MRSA (methicillin resistant staphylococcus aureus) pneumonia   . Pulmonary hypertension   . Chronic anemia     anemia of chronic disease based on anemia panel 2011  . Pancreatic mass 08/01/2012  . PONV (postoperative nausea and vomiting)   . Heart murmur     "when I was small"  . CHF (congestive heart failure)   . Anginal pain   . Pneumonia X 3  . Type II diabetes mellitus   . Hypothyroidism   . H/O hiatal hernia   . GERD (gastroesophageal reflux disease)   . Anxiety   . On home  oxygen therapy     "6L all the time" (02/24/2014   Past Surgical History:  Past Surgical History  Procedure Laterality Date  . Coronary artery bypass graft  1997    LIMA to LAD,SVG to OM  . Esophagogastroduodenoscopy  06/28/10    schatzki ring otherwise normal;small hiatal hernia  . Coronary angioplasty  1993  . Coronary angioplasty with stent placement  ~ 1996; 2008    "think I have 3 stents"  . Abdominal hysterectomy  ~ 1986  . Dilation and curettage of uterus  ~ 1984  . Tubal ligation    . Esophagogastroduodenoscopy N/A 06/06/2014    Procedure: ESOPHAGOGASTRODUODENOSCOPY (EGD);  Surgeon: Lafayette Dragon, MD;  Location: [facility] ENDOSCOPY;  Service: Endoscopy;  Laterality: N/A;  . Hip arthroplasty Right 07/29/2014    Procedure: ARTHROPLASTY BIPOLAR HIP;  Surgeon: Mcarthur Rossetti, MD;  Location: [place];  Service: Orthopedics;  Laterality: Right;   Social History:  reports that she has never smoked. She has never used smokeless tobacco. She reports that she does not drink alcohol or use illicit drugs.  Family / Support Systems Marital Status: Married Patient Roles: Spouse Spouse/Significant Other: husband, Meagan [name] @ [phone] or [phone] Children: None Other Supports: sister, Meagan [name] @ [phone] Anticipated Caregiver: husband or sisters Ability/Limitations of Caregiver: no limitations from husband. Sisters can also be  with pt if husband has to go out. Caregiver Availability: 24/7 Family Dynamics: husband and sisters very supportive and fully intend to provide 24/7 care  Social History Preferred language: English Religion: Baptist Cultural Background: NA Education: HS Read: Yes Write: Yes Employment Status: Retired Freight forwarder Issues: None Guardian/Conservator: None - per MD, pt capable of making decisions on his own behalf   Abuse/Neglect Physical Abuse: Denies Verbal Abuse: Denies Sexual Abuse: Denies Exploitation of patient/patient's  resources: Denies Self-Neglect: Denies  Emotional Status Pt's affect, behavior adn adjustment status: Pt pleasant, oriented and hopeful she will be home soon.  Denies any s/s of emotional distress but simple frustration with fall/ fx.  Will monitor through stay. Recent Psychosocial Issues: None Pyschiatric History: None Substance Abuse History: none  Patient / Family Perceptions, Expectations & Goals Pt/Family understanding of illness & functional limitations: Pt and husband with good understanding of her injury and of precautions to be followed as well as purpose of CIR. Premorbid pt/family roles/activities: Pt independent overall  but notes husband does all the driving and assists with b/d. Anticipated changes in roles/activities/participation: Little change anticipated as husband was assisting PTA Pt/family expectations/goals: "I just want to get home as soon as possible."  [employer]: None Premorbid Home Care/DME Agencies: Other (Comment) [facility]) Transportation available at discharge: yes  Discharge Planning Support Systems: Spouse/significant other;Other relatives Type of Residence: Private residence Insurance Resources: Education officer, museum (specify) ([employer]) Museum/gallery curator Resources: [place] Referred: No Living Expenses: Own Money Management: Spouse Does the patient have any problems obtaining your medications?: No Home Management: husband Patient/Family Preliminary Plans: pt to return home with husband providing primary care Social Work Anticipated Follow Up Needs: HH/OP Expected length of stay: 10-14 days  Clinical Impression Pleasant woman here following fall at home with hip fx.  Excellent support from husband who can provide 24/7 assist.  No s/s of emotional distress - will monitor.  Follow for support and d/c planning.  Zenna Traister 08/05/2014, 10:38 AM

## 2014-08-05 NOTE — Progress Notes (Signed)
Patient information reviewed and entered into eRehab system by [place]ton Urieta, RN, CRRN, PPS Coordinator.  Information including medical coding and functional independence measure will be reviewed and updated through discharge.     Per nursing patient was given "Data Collection Information Summary for Patients in Inpatient Rehabilitation Facilities with attached "Privacy Act Statement-Health Care Records" upon admission.  

## 2014-08-05 NOTE — Progress Notes (Signed)
Occupational Therapy Session Note  Patient Details  Name: Meagan [name] MRN: [id number] Date of Birth: 23-May-1946  Today's Date: 08/05/2014 OT Individual Time: 9323-5573 OT Individual Time Calculation (min): 60 min   Short Term Goals: Week 1:  OT Short Term Goal 1 (Week 1): Pt will complete UB/LB dressing using AE prn w/ min A. OT Short Term Goal 2 (Week 1): Pt will complete UB/LB bathing using AE prn w/ min A. OT Short Term Goal 3 (Week 1): Pt will complete toilet t/f onto 3-in-1 commode w/ min A. OT Short Term Goal 4 (Week 1): Pt will be educated on UE strengthening and ROM HEP.  Skilled Therapeutic Interventions/Progress Updates:  Pt seated in w/c w/ husband in room when arriving. Pt reporting 7/10 pain and RN had administered pain medication 30 min prior. At beginning of session, pt O2 sat at 99% on 6L O2 nasal cannula. Pt identified 2/3 anterior hip precautions and was educated on remaining hip precaution. Pt wheeled to bathroom and performed toilet t/f onto drop-arm commode using RW w/ mod A and mod instructional cueing for hand placement when standing from w/c. Pt urinated, had BM, and stood to perform toilet hygiene w/ min A. Pt t/f to w/c using RW w/ min A and mod demonstrational cueing for RW use. Pt O2 sat at 95% on 6L O2 nasal cannula after t/f. Pt wheeled out of bathroom and given rest break w/ RLE elevated 2/2 to pain in hip. Pt self-propelled to sink and performed hand washing while standing w/ min A. Pt reported dizziness when bringing head to neutral position, so she keeps head flexed when standing. Pt reporting 7/10 pain and indigestion at end of session, RN notified. Pt seated in w/c w/ RLE elevated and all needs nearby when leaving.   Therapy Documentation Precautions:  Precautions Precautions: Anterior Hip Precaution Comments: 6L O2 (used this at home); and medication pump Restrictions Weight Bearing Restrictions: Yes RLE Weight Bearing: Weight bearing as  tolerated  See FIM for current functional status  Therapy/Group: Individual Therapy  [place] Raynell 08/05/2014, 2:42 PM  Clyda Greener, OTR/L 08/05/2014

## 2014-08-05 NOTE — Care Management Note (Signed)
Inpatient Rehabilitation Center Individual Statement of Services  Patient Name:  Meagan [name]  Date:  08/05/2014  Welcome to the [facility].  Our goal is to provide you with an individualized program based on your diagnosis and situation, designed to meet your specific needs.  With this comprehensive rehabilitation program, you will be expected to participate in at least 3 hours of rehabilitation therapies Monday-Friday, with modified therapy programming on the weekends.  Your rehabilitation program will include the following services:  Physical Therapy (PT), Occupational Therapy (OT), 24 hour per day rehabilitation nursing, Therapeutic Recreaction (TR), Case Management (Social Worker), Rehabilitation Medicine, Nutrition Services and Pharmacy Services  Weekly team conferences will be held on Tuesdays to discuss your progress.  Your Social Worker will talk with you frequently to get your input and to update you on team discussions.  Team conferences with you and your family in attendance may also be held.  Expected length of stay: 10-14 days  Overall anticipated outcome: supervision/ minimal assist  Depending on your progress and recovery, your program may change. Your Social Worker will coordinate services and will keep you informed of any changes. Your Social Worker's name and contact numbers are listed  below.  The following services may also be recommended but are not provided by the [facility] will be made to provide these services after discharge if needed.  Arrangements include referral to agencies that provide these services.  Your insurance has been verified to be:  Medicare and [employer] Your primary doctor is:  Dr. Gerarda Fraction  Pertinent information will be shared with your doctor and your insurance company.  Social  Worker:  [place], [place] or ([phone]   Information discussed with and copy given to patient by: Lennart Pall, 08/05/2014, 10:19 AM

## 2014-08-05 NOTE — IPOC Note (Signed)
Overall Plan of Care [facility]) Patient Details Name: BRIGID VANDEKAMP MRN: [id number] DOB: 15-Nov-1945  Admitting Diagnosis: R hip fx  Hospital Problems: Active Problems:   Hip fracture     Functional Problem List: Nursing Bowel;Endurance;Medication Management;Pain;Skin Integrity  PT Warehouse manager;Behavior;Edema;Endurance;Motor;Pain;Safety;Sensory  OT Balance;Edema;Endurance;Cognition;Pain;Safety;Skin Integrity  SLP    TR         Basic ADL's: OT Grooming;Bathing;Dressing;Toileting     Advanced  ADL's: OT       Transfers: PT Bed Mobility;Bed to Chair;Car;Furniture  OT Toilet;Tub/Shower     Locomotion: PT Ambulation;Wheelchair Mobility;Stairs     Additional Impairments: OT    SLP        TR      Anticipated Outcomes Item Anticipated Outcome  Self Feeding    Swallowing      Basic self-care  S to Mod I  Toileting  S to Mod I   Bathroom Transfers S to Mod I  Bowel/Bladder  Continent bowel S/P C-Diff; Min assist.  Transfers  supervision  Locomotion  supervision  Communication     Cognition     Pain  Managed at goal; 3/10  Safety/Judgment  Aware of precautions; no unsafe behaviors.   Therapy Plan: PT Intensity: Minimum of 1-2 x/day ,45 to 90 minutes PT Frequency: 5 out of 7 days PT Duration Estimated Length of Stay: 10-14 days OT Intensity: Minimum of 1-2 x/day, 45 to 90 minutes OT Frequency: 5 out of 7 days OT Duration/Estimated Length of Stay: 2 weeks         Team Interventions: Nursing Interventions Patient/Family Education;Pain Management;Medication Management;Bowel Management;Skin Care/Wound Management;Disease Management/Prevention;Discharge Planning;Psychosocial Support  PT interventions Ambulation/gait training;Discharge planning;Functional mobility training;Psychosocial support;Therapeutic Activities;Balance/vestibular training;Disease management/prevention;Neuromuscular re-education;Skin care/wound management;Therapeutic  Exercise;Wheelchair propulsion/positioning;Cognitive remediation/compensation;DME/adaptive equipment instruction;Pain management;Splinting/orthotics;UE/LE Strength taining/ROM;Community reintegration;Patient/family education;Stair training;UE/LE Coordination activities  OT Interventions Balance/vestibular training;Cognitive remediation/compensation;Community reintegration;Discharge planning;DME/adaptive equipment instruction;Functional mobility training;Patient/family education;Psychosocial support;Self Care/advanced ADL retraining;Therapeutic Activities;Therapeutic Exercise;UE/LE Strength taining/ROM  SLP Interventions    TR Interventions    SW/CM Interventions Discharge Planning;Psychosocial Support;Patient/Family Education    Team Discharge Planning: Destination: PT-  ,OT- Home , SLP-  Projected Follow-up: PT-Home health PT;24 hour supervision/assistance, OT-  Home health OT, SLP-  Projected Equipment Needs: PT-To be determined, OT- To be determined, SLP-  Equipment Details: PT-pt has brother's old w/c which she thinks will fit her, but PT asked her to have family bring it in; pt has junior 4WW, OT-  Patient/family involved in discharge planning: PT- Patient;Family member/caregiver,  OT-Patient, SLP-   MD ELOS: 13-17 days    Medical Rehab Prognosis:  Good Assessment: 68 y.o. right-handed female with history of systolic congestive heart failure, severe PAH(pulmonary arterial hypertension) maintained on continuous flofan infusion, fibromyalgia, COPD with chronic oxygen of 6 L, CAD with CABG and diabetes mellitus with peripheral neuropathy. Independent prior to admission living with her husband and used a walker outside of the home. Presented 07/27/2014 after mechanical fall. Denies loss of consciousness or associated chest pain or shortness or breath associated with fall. X-rays and imaging revealed a right hip displaced and angulated femoral neck fracture. Cardiology followup preoperatively and  cleared for surgery. Underwent right total hip arthroplasty 07/29/2014 per Dr. Ninfa Linden. Weightbearing as tolerated with anterior hip precautions. Hospital course pain management. Subcutaneous Lovenox for DVT prophylaxis. Acute blood loss anemia and transfuse 1 unit packed red blood cells with latest hemoglobin 8.9 and monitored. Patient with history of recurrent Clostridium difficile maintained on contact precautions as well as vancomycin 125 mg 4 times a day x14  days initiated 08/01/2014  Now requiring 24/7 Rehab RN,MD, as well as CIR level PT, OT .  Treatment team will focus on ADLs and mobility with goals set at Sup/Mod I   See Team Conference Notes for weekly updates to the plan of care

## 2014-08-05 NOTE — Progress Notes (Signed)
Physical Therapy Session Note  Patient Details  Name: Meagan [name] MRN: [id number] Date of Birth: 1946-10-01  Today's Date: 08/05/2014 PT Individual Time: 0850-0930 PT Individual Time Calculation (min): 40 min   Short Term Goals: Week 1:  PT Short Term Goal 1 (Week 1): Pt will demonstrate supine to sit transfer req mod A.  PT Short Term Goal 2 (Week 1): Pt will demonstrate sit to supine req SBA.  PT Short Term Goal 3 (Week 1): Pt will transfer w/c to/from bed req min A.  PT Short Term Goal 4 (Week 1): Pt will ambulate 15' with walker req min A.  PT Short Term Goal 5 (Week 1): Pt will self propel manual w/c x 50' req mod A.   Skilled Therapeutic Interventions/Progress Updates:   Pt missed 20 min of skilled PT due to not yet having breakfast due to OT and PT back to back and tray not in room. Resolved breakfast issue with RN and tech and gave pt time to eat. Also recommended to scheduling staff to not have back to back therapies in AM during breakfast time.  Pt limited with participation due to pain, anxiety, and fatigue. Cues throughout for pursed lip breathing and husband providing encouragement throughout session as well. Focused on functional sit to stands with RW (cues for hand placement, technique and encouragement) with min to mod A, 2 trials of gait training with RW with significant amount of time to travel a few steps forward each time (~3') with min A and cues for upright posture (pt leaning forward and initially resting forearms on RW), and w/c propulsion and brake management with mod A to initiate movement of w/c for general mobility and strengthening.   Therapy Documentation Precautions:  Precautions Precautions: Anterior Hip Precaution Comments: 6L O2 (used this at home); and medication pump Restrictions Weight Bearing Restrictions: Yes RLE Weight Bearing: Weight bearing as tolerated  Pain:  c/o pain in RLE - medication given by RN.  See FIM for current functional  status  Therapy/Group: Individual Therapy  [name] [facility] 08/05/2014, 10:29 AM

## 2014-08-06 ENCOUNTER — Inpatient Hospital Stay (HOSPITAL_COMMUNITY): Payer: Medicare Other

## 2014-08-06 ENCOUNTER — Inpatient Hospital Stay (HOSPITAL_COMMUNITY): Payer: Medicare Other | Admitting: *Deleted

## 2014-08-06 LAB — GLUCOSE, CAPILLARY
GLUCOSE-CAPILLARY: 146 mg/dL — AB (ref 70–99)
GLUCOSE-CAPILLARY: 159 mg/dL — AB (ref 70–99)
GLUCOSE-CAPILLARY: 92 mg/dL (ref 70–99)
Glucose-Capillary: 154 mg/dL — ABNORMAL HIGH (ref 70–99)

## 2014-08-06 LAB — URINE CULTURE: Colony Count: >=100000

## 2014-08-06 MED ORDER — FLUCONAZOLE 100 MG PO TABS
100.0000 mg | ORAL_TABLET | Freq: Every day | ORAL | Status: AC
  Administered 2014-08-06 – 2014-08-12 (×7): 100 mg via ORAL
  Filled 2014-08-06 (×8): qty 1

## 2014-08-06 NOTE — Progress Notes (Signed)
Occupational Therapy Session Note  Patient Details  Name: Meagan [name] MRN: [id number] Date of Birth: 10/24/1945  Today's Date: 08/06/2014 OT Individual Time:  -   1015-1100  (45 min)  1st session                                            Pain:  8/10 right hip and low back   Short Term Goals: Week 1:  OT Short Term Goal 1 (Week 1): Pt will complete UB/LB dressing using AE prn w/ min A. OT Short Term Goal 2 (Week 1): Pt will complete UB/LB bathing using AE prn w/ min A. OT Short Term Goal 3 (Week 1): Pt will complete toilet t/f onto 3-in-1 commode w/ min A. OT Short Term Goal 4 (Week 1): Pt will be educated on UE strengthening and ROM HEP.  Skilled Therapeutic Interventions/Progress Updates:    1st session:  Pt. assleep in bed.  Husband sitting beside her.  Pt needed increased time (5 min) to arouse.  She stated she was rest.  Pain was 8/10 in right hip and low back.  .  Nurse had medicated  She was able to recall 2/3 ant  Hip precautions and needed min verbal cues to adhere to them in the treatment session.  Went from supine to EOB with m0d assist.  Sat EOB to perform B/d session.  Stood x5 for LB bathing dressing with mod assist and max assist to go from stand to sit due to decreased eccentric control  Pt.retruned back to bed at end of sesssin with all needs in reach.   Time: 3614-4315   (45 min)  2nd session   Pain:  8/10  Right hip and lw back Individual session:  Pt on 6 liters of 0xygen   Addressed balance in standing, bed mobility>  Pt. Use LLE to assist RLE to go from supine to EOB with min assist.  SAt on EOB with SBA for safety.  Practiced sit to stand x 4 with each standing tolerance lasting about 45 sec to 1 minute.  Pt. Did sit to stand with min assist and verbal cues for hand placement and locking 4WW, but needed mod assist for stand to sit.  Enforced breathing while exercising.  Pt. Returned to bed at end of session with max assist lifting legs.  Left with all needs in reach  and husband by her side.  Time:   3rd session:  1540-1610 (30 min) Pain: 7/10  Right hip and back Individual session:  Addressed bed mobility, functional mobility, standing tolerance, transfers..  Pt. Lying in bed upon OT arrival.  Flattened bed for pt to practice supine to sit. Pt. Recalled 3/3 ant hip precautions and technique for carrying RLE with LLE.  Pt went from supine to sit with min assist.  Sat on EOB.  BP= 136/52, O2= 100 % on 6 liters; HR= 97>  Engaged in standing balance activities with pt maintaining isometric control.  Instructed pt about stand to sit with control and not going down hard.  Pt practiced 4 times and was tired and nauseated  Time:  4th session:  1610-1635  (25 min)  Make up session Pain:8/1  Right hip Individual session:  Continuation of 3rd treatment (session as makeup)>  Pt. utilized RW and ambulated to BR with minimal assist and max verbal cues to step forward with  LLE. Pt on 6 liters of oxygen.  Pt transferred to toilet with max facilitation for movement in the transverse plane.  She turned about 45 degrees before sitting down.  Instructed pt about turning fully 180 degrees and lining up with the toilet.  Pt. Doffed and donned pants with min assist for balance.  Ambulated to sink and sat on 4WW to perform grooming activities.  Pt complained of feeling nauseated.  Ambulated to recliner chair and placed pt in reclined position.  Husband present during all of treatment session.  Provided much praise for pt's performance.  Pt asked at end of session, "Do you think I am ever going to get better."  Offered much praise and encouragement.  Pt. Left in recliner with all needs in reach.     Therapy Documentation Precautions:  Precautions Precautions: Anterior Hip Precaution Comments: 6L O2 (used this at home); and medication pump Restrictions Weight Bearing Restrictions: Yes RLE Weight Bearing: Weight bearing as tolerated      Pain: SEE above   See FIM for current  functional status  Therapy/Group: Individual Therapy  Lisa Roca 08/06/2014, 12:54 PM

## 2014-08-06 NOTE — Progress Notes (Signed)
Subjective:    In rehab. Doing well from cardiac perspective. Remains on Flolan.    Intake/Output Summary (Last 24 hours) at 08/06/14 2131 Last data filed at 08/06/14 1758  Gross per 24 hour  Intake    360 ml  Output      0 ml  Net    360 ml    Current meds: . ALPRAZolam  1 mg Oral QHS  . diclofenac sodium  2 g Topical QID  . DULoxetine  30 mg Oral QHS  . DULoxetine  60 mg Oral Daily  . enoxaparin (LOVENOX) injection  40 mg Subcutaneous Q24H  . ezetimibe-simvastatin  1 tablet Oral QHS  . fluconazole  100 mg Oral Daily  . furosemide  80 mg Oral Daily  . ice pack  1 each Other 3 times per day  . insulin aspart  0-15 Units Subcutaneous TID WC  . insulin glargine  8 Units Subcutaneous QHS  . isosorbide mononitrate  90 mg Oral q morning - 10a  . levothyroxine  25 mcg Oral QAC breakfast  . metoprolol succinate  100 mg Oral Daily  . pantoprazole  40 mg Oral Q1200  . potassium chloride SA  40 mEq Oral Daily  . vancomycin  125 mg Oral 4 times per day   Infusions: . epoprostenol 33.45 ng/kg/min (08/06/14 1758)     Objective:  Blood pressure 100/54, pulse 94, temperature 98 F (36.7 C), temperature source Oral, resp. rate 18, weight 68.448 kg (150 lb 14.4 oz), SpO2 100.00%. Weight change:  Physical Exam:  General: Chronically-ill appearing. Lying flat in bed Wearing [place] O2 HEENT: normal  Neck: thick . JVP ~5 Carotids 2+ bilat; no bruits. No lymphadenopathy or thryomegaly appreciated.  Cor: PMI nonpalpable Regular tachy. 2/6 TR. P2 perhaps mildly accentuated but not severely so  Hickman catheter ok  Lungs: clear but decreased throughout no wheezing  Abdomen: obese. soft, nontender, nondistended. No hepatosplenomegaly. No bruits or masses. hypoactivends.  Extremities: no cyanosis, clubbing  Neuro: alert & oriented x 3, cranial nerves grossly intact. moves all 4 extremities w/o difficulty. Affect pleasant.    Telemetry: SR 100-110  Lab Results: Basic Metabolic  Panel:  Recent Labs Lab 07/31/14 0216 08/01/14 0332 08/02/14 0412 08/03/14 0505 08/04/14 0622  NA 134* 130* 132* 132* 133*  K 3.5* 3.6* 3.9 4.0 4.4  CL 91* 88* 91* 91* 92*  CO2 29 28 29 29 29   GLUCOSE 139* 165* 146* 140* 135*  BUN 17 18 18 16 15   CREATININE 0.98 0.90 0.90 0.88 0.81  CALCIUM 8.5 8.4 8.5 8.7 8.8  MG  --   --   --  1.8  --    Liver Function Tests:  Recent Labs Lab 08/04/14 0622  AST 13  ALT <5  ALKPHOS 101  BILITOT 0.4  PROT 5.9*  ALBUMIN 2.0*   No results found for this basename: LIPASE, AMYLASE,  in the last 168 hours No results found for this basename: AMMONIA,  in the last 168 hours CBC:  Recent Labs Lab 07/31/14 0216 08/01/14 0332 08/02/14 0412 08/02/14 1919 08/04/14 0622  WBC 25.9* 22.9* 19.1* 20.7* 15.8*  NEUTROABS  --   --   --   --  13.1*  HGB 8.4* 7.4* 7.4* 8.9* 9.3*  HCT 24.8* 21.7* 21.9* 26.6* 26.9*  MCV 82.4 83.1 83.3 83.4 83.8  PLT 268 286 264 249 304   Cardiac Enzymes: No results found for this basename: CKTOTAL, CKMB, CKMBINDEX, TROPONINI,  in the last 168  hours BNP: No components found with this basename: POCBNP,  CBG:  Recent Labs Lab 08/05/14 1630 08/05/14 2201 08/06/14 0720 08/06/14 1152 08/06/14 1621  GLUCAP 183* 130* 146* 159* 154*   Microbiology: Lab Results  Component Value Date   CULT  Value: Multiple bacterial morphotypes present, none predominant. Suggest appropriate recollection if clinically indicated. Performed at [facility] 08/05/2014   CULT  Value: NO GROWTH Performed at [facility] 06/03/2014   CULT NO GROWTH 5 DAYS 06/03/2014   CULT NO GROWTH 5 DAYS 06/03/2014   CULT NO GROWTH 3 DAYS 05/14/2012    Recent Labs Lab 08/05/14 1057  CULT Multiple bacterial morphotypes present, none predominant. Suggest appropriate recollection if clinically indicated. Performed at [place]    Imaging: No results found.   ASSESSMENT:  1. Hip fracture s/p  repair 10/8 2. Severe PAH on flolan  3. CAD s/p CABG. LAD lesion on last cath does not appear to be flow limiting  4. Chronic Diastolic HF  5. H/O Of GI bleed from esophagitis  6. Hypokalemia  7. Diarrhea H/O C diff  8. Hypothyroid  9. DM  10. C. Diff + 11. NSVT 12. Hypomagnesemia   PLAN/DISCUSSION:  Stable from cardiac perspective. No new recs at this time. Continue Flolan.      LOS: 3 days  Glori Bickers, MD 08/06/2014, 9:31 PM

## 2014-08-06 NOTE — Progress Notes (Signed)
Physical Therapy Session Note  Patient Details  Name: Meagan [name] MRN: [id number] Date of Birth: Feb 25, 1946  Today's Date: 08/06/2014 PT Individual Time: 0800-0900 PT Individual Time Calculation (min): 60 min   Short Term Goals: Week 1:  PT Short Term Goal 1 (Week 1): Pt will demonstrate supine to sit transfer req mod A.  PT Short Term Goal 2 (Week 1): Pt will demonstrate sit to supine req SBA.  PT Short Term Goal 3 (Week 1): Pt will transfer w/c to/from bed req min A.  PT Short Term Goal 4 (Week 1): Pt will ambulate 15' with walker req min A.  PT Short Term Goal 5 (Week 1): Pt will self propel manual w/c x 50' req mod A.   Skilled Therapeutic Interventions/Progress Updates:   W/c mobility training in the room to pull up to sink with cues to initiate leaning forward to perform handwashing and then focused on w/c propulsion in hallway with cues for technique and initially min A needed for turning. Sit to stands for functional strengthening and to improve transfers with min A and cues each time for hand placement and technique. Gait trials with RW and progressed to pt's personal rollator (keeping brakes locked for increased stability) x 5' each time x 3 trials with rest breaks seated due to increased SOB (O2 remained at 100%) on 6L O2. Pt improved tolerance today, but still requiring encouragement for participation to push herself in therapy and work through pain and fatigue. Educated on importance of OOB during the day as pt wanting to get back to bed right after therapy.  Therapy Documentation Precautions:  Precautions Precautions: Anterior Hip Precaution Comments: 6L O2 (used this at home); and medication pump Restrictions Weight Bearing Restrictions: Yes RLE Weight Bearing: Weight bearing as tolerated Vital Signs: O2 Device: Nasal cannula O2 Flow Rate (L/min): 6 L/min Pain: 7/10 pain "all over" - RN notified for pain medication.  See FIM for current functional  status  Therapy/Group: Individual Therapy  [name] [facility] 08/06/2014, 9:01 AM

## 2014-08-06 NOTE — Progress Notes (Signed)
68 y.o. right-handed female with history of systolic congestive heart failure, severe PAH(pulmonary arterial hypertension) maintained on continuous flofan infusion, fibromyalgia, COPD with chronic oxygen of 6 L, CAD with CABG and diabetes mellitus with peripheral neuropathy. Independent prior to admission living with her husband and used a walker outside of the home. Presented 07/27/2014 after mechanical fall. Denies loss of consciousness or associated chest pain or shortness or breath associated with fall. X-rays and imaging revealed a right hip displaced and angulated femoral neck fracture. Cardiology followup preoperatively and cleared for surgery. Underwent right total hip arthroplasty 07/29/2014 per Dr. Ninfa Linden. Weightbearing as tolerated with anterior hip precautions. Hospital course pain management. Subcutaneous Lovenox for DVT prophylaxis. Acute blood loss anemia and transfuse   Subjective/Complaints: Pt without new c/os, my Fibro gets worse when I am cold Review of Systems - Respiratory ROS: no cough, shortness of breath, or wheezing positive for - "non specific breathing problem" Objective: Vital Signs: Blood pressure 118/58, pulse 99, temperature 98.2 F (36.8 C), temperature source Oral, resp. rate 18, weight 68.448 kg (150 lb 14.4 oz), SpO2 100.00%. No results found. Results for orders placed during the hospital encounter of 08/03/14 (from the past 72 hour(s))  GLUCOSE, CAPILLARY     Status: Abnormal   Collection Time    08/03/14  8:55 PM      Result Value Ref Range   Glucose-Capillary 185 (*) 70 - 99 mg/dL  CBC WITH DIFFERENTIAL     Status: Abnormal   Collection Time    08/04/14  6:22 AM      Result Value Ref Range   WBC 15.8 (*) 4.0 - 10.5 K/uL   Comment: WHITE COUNT CONFIRMED ON SMEAR   RBC 3.21 (*) 3.87 - 5.11 MIL/uL   Hemoglobin 9.3 (*) 12.0 - 15.0 g/dL   HCT 26.9 (*) 36.0 - 46.0 %   MCV 83.8  78.0 - 100.0 fL   MCH 29.0  26.0 - 34.0 pg   MCHC 34.6  30.0 - 36.0 g/dL    RDW 13.7  11.5 - 15.5 %   Platelets 304  150 - 400 K/uL   Comment: PLATELET COUNT CONFIRMED BY SMEAR   Neutrophils Relative % 83 (*) 43 - 77 %   Lymphocytes Relative 8 (*) 12 - 46 %   Monocytes Relative 6  3 - 12 %   Eosinophils Relative 3  0 - 5 %   Basophils Relative 0  0 - 1 %   Neutro Abs 13.1 (*) 1.7 - 7.7 K/uL   Lymphs Abs 1.3  0.7 - 4.0 K/uL   Monocytes Absolute 0.9  0.1 - 1.0 K/uL   Eosinophils Absolute 0.5  0.0 - 0.7 K/uL   Basophils Absolute 0.0  0.0 - 0.1 K/uL  COMPREHENSIVE METABOLIC PANEL     Status: Abnormal   Collection Time    08/04/14  6:22 AM      Result Value Ref Range   Sodium 133 (*) 137 - 147 mEq/L   Potassium 4.4  3.7 - 5.3 mEq/L   Comment: HEMOLYSIS AT THIS LEVEL MAY AFFECT RESULT   Chloride 92 (*) 96 - 112 mEq/L   CO2 29  19 - 32 mEq/L   Glucose, Bld 135 (*) 70 - 99 mg/dL   BUN 15  6 - 23 mg/dL   Creatinine, Ser 0.81  0.50 - 1.10 mg/dL   Calcium 8.8  8.4 - 10.5 mg/dL   Total Protein 5.9 (*) 6.0 - 8.3 g/dL   Albumin 2.0 (*)  3.5 - 5.2 g/dL   AST 13  0 - 37 U/L   Comment: HEMOLYSIS AT THIS LEVEL MAY AFFECT RESULT   ALT <5  0 - 35 U/L   Comment: HEMOLYSIS AT THIS LEVEL MAY AFFECT RESULT   Alkaline Phosphatase 101  39 - 117 U/L   Total Bilirubin 0.4  0.3 - 1.2 mg/dL   GFR calc non Af Amer 72 (*) >90 mL/min   GFR calc Af Amer 84 (*) >90 mL/min   Comment: (NOTE)     The eGFR has been calculated using the CKD EPI equation.     This calculation has not been validated in all clinical situations.     eGFR's persistently <90 mL/min signify possible Chronic Kidney     Disease.   Anion gap 12  5 - 15  GLUCOSE, CAPILLARY     Status: Abnormal   Collection Time    08/04/14 11:59 AM      Result Value Ref Range   Glucose-Capillary 159 (*) 70 - 99 mg/dL  GLUCOSE, CAPILLARY     Status: Abnormal   Collection Time    08/04/14  3:35 PM      Result Value Ref Range   Glucose-Capillary 286 (*) 70 - 99 mg/dL  GLUCOSE, CAPILLARY     Status: Abnormal   Collection Time     08/04/14  9:05 PM      Result Value Ref Range   Glucose-Capillary 166 (*) 70 - 99 mg/dL   Comment 1 Notify RN    GLUCOSE, CAPILLARY     Status: Abnormal   Collection Time    08/05/14  6:49 AM      Result Value Ref Range   Glucose-Capillary 135 (*) 70 - 99 mg/dL  URINALYSIS, ROUTINE W REFLEX MICROSCOPIC     Status: Abnormal   Collection Time    08/05/14 10:57 AM      Result Value Ref Range   Color, Urine YELLOW  YELLOW   APPearance CLOUDY (*) CLEAR   Specific Gravity, Urine 1.015  1.005 - 1.030   pH 6.5  5.0 - 8.0   Glucose, UA NEGATIVE  NEGATIVE mg/dL   Hgb urine dipstick SMALL (*) NEGATIVE   Bilirubin Urine NEGATIVE  NEGATIVE   Ketones, ur NEGATIVE  NEGATIVE mg/dL   Protein, ur NEGATIVE  NEGATIVE mg/dL   Urobilinogen, UA 0.2  0.0 - 1.0 mg/dL   Nitrite NEGATIVE  NEGATIVE   Leukocytes, UA LARGE (*) NEGATIVE  URINE MICROSCOPIC-ADD ON     Status: Abnormal   Collection Time    08/05/14 10:57 AM      Result Value Ref Range   Squamous Epithelial / LPF FEW (*) RARE   WBC, UA TOO NUMEROUS TO COUNT  <3 WBC/hpf   RBC / HPF 0-2  <3 RBC/hpf   Bacteria, UA FEW (*) RARE   Urine-Other MANY YEAST    GLUCOSE, CAPILLARY     Status: Abnormal   Collection Time    08/05/14 11:19 AM      Result Value Ref Range   Glucose-Capillary 207 (*) 70 - 99 mg/dL   Comment 1 Notify RN    GLUCOSE, CAPILLARY     Status: Abnormal   Collection Time    08/05/14  4:30 PM      Result Value Ref Range   Glucose-Capillary 183 (*) 70 - 99 mg/dL  GLUCOSE, CAPILLARY     Status: Abnormal   Collection Time    08/05/14 10:01 PM  Result Value Ref Range   Glucose-Capillary 130 (*) 70 - 99 mg/dL  GLUCOSE, CAPILLARY     Status: Abnormal   Collection Time    08/06/14  7:20 AM      Result Value Ref Range   Glucose-Capillary 146 (*) 70 - 99 mg/dL      Constitutional: She is oriented to person, place, and time. She appears well-developed.  HENT: oral mucosa moist, dentition fair  Head: Normocephalic.   Eyes: EOM are normal.  Neck: Normal range of motion. Neck supple. No thyromegaly present.  Cardiovascular: Normal rate and regular rhythm.  Respiratory: Breath sounds normal. No respiratory distress.  GI: Soft. Bowel sounds are normal. She exhibits no distension.  Neurological: She is alert and oriented to person, place, and time. Fair insight and awareness.  Follows three-step commands. HOH sometimes limiting. UES: 4+ to 5/5. BSW:HQPRFFM due to pain, 1/5 hf, 1+ KE and 4- ADF/APF. LLE grossly 3+ to 4+/5 prox to distal. No gross sensory findings.  Skin:   M/S: right hip with mild edema. Area very sensitive to touch.  Psychiatric:  Affect flat,  anxious   Assessment/Plan: 1. Functional deficits secondary to Right Femoral neck fracture which require 3+ hours per day of interdisciplinary therapy in a comprehensive inpatient rehab setting. Physiatrist is providing close team supervision and 24 hour management of active medical problems listed below. Physiatrist and rehab team continue to assess barriers to discharge/monitor patient progress toward functional and medical goals. FIM: FIM - Bathing Bathing Steps Patient Completed: Chest;Right Arm;Left Arm;Abdomen;Right upper leg;Left upper leg;Front perineal area;Buttocks Bathing: 4: Steadying assist  FIM - Upper Body Dressing/Undressing Upper body dressing/undressing steps patient completed: Thread/unthread right sleeve of pullover shirt/dresss;Thread/unthread left sleeve of pullover shirt/dress;Put head through opening of pull over shirt/dress;Pull shirt over trunk Upper body dressing/undressing: 5: Set-up assist to: Obtain clothing/put away FIM - Lower Body Dressing/Undressing Lower body dressing/undressing steps patient completed: Pull underwear up/down;Pull pants up/down;Thread/unthread left underwear leg;Thread/unthread right underwear leg;Thread/unthread right pants leg;Thread/unthread left pants leg Lower body dressing/undressing: 3:  Mod-Patient completed 50-74% of tasks  FIM - Toileting Toileting steps completed by patient: Adjust clothing prior to toileting;Performs perineal hygiene;Adjust clothing after toileting Toileting Assistive Devices: Grab bar or rail for support Toileting: 4: Steadying assist  FIM - Radio producer Devices: Walker;Grab bars (Drop arm commode) Toilet Transfers: 3-To toilet/BSC: Mod A (lift or lower assist);4-From toilet/BSC: Min A (steadying Pt. > 75%)  FIM - Control and instrumentation engineer Devices: Walker;Arm rests;HOB elevated Bed/Chair Transfer: 2: Supine > Sit: Max A (lifting assist/Pt. 25-49%);3: Bed > Chair or W/C: Mod A (lift or lower assist)  FIM - Locomotion: Wheelchair Distance: 15 Locomotion: Wheelchair: 2: Travels 50 - 149 ft with minimal assistance (Pt.>75%) FIM - Locomotion: Ambulation Locomotion: Ambulation Assistive Devices:  (rollator) Ambulation/Gait Assistance: 4: Min assist Locomotion: Ambulation: 1: Travels less than 50 ft with minimal assistance (Pt.>75%)  Comprehension Comprehension Mode: Auditory Comprehension: 4-Understands basic 75 - 89% of the time/requires cueing 10 - 24% of the time  Expression Expression Mode: Verbal Expression: 6-Expresses complex ideas: With extra time/assistive device  Social Interaction Social Interaction: 5-Interacts appropriately 90% of the time - Needs monitoring or encouragement for participation or interaction.  Problem Solving Problem Solving: 4-Solves basic 75 - 89% of the time/requires cueing 10 - 24% of the time  Memory Memory: 3-Recognizes or recalls 50 - 74% of the time/requires cueing 25 - 49% of the time  Medical Problem List and Plan:  1. Functional  deficits secondary to displaced right femoral neck fracture. 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 Weightbearing as tolerated with anterior hip precautions  2. DVT Prophylaxis/Anticoagulation: Subcutaneous Lovenox.  Monitor platelet counts and any signs of bleeding. Check vascular study  3. Pain Management: Oxycodone as needed. Monitor with increased mobility, Cymbalta for Fibromyalgia  4. Mood/anxiety/depression: Xanax 1 mg each bedtime. Provide emotional support  5. Neuropsych: This patient is capable of making decisions on her own behalf.  6. Skin/Wound Care: Routine skin checks  7. Fluids/Electrolytes/Nutrition: Followup chemistries was strict I and Os. Provide nutritional supplements as needed  8. Severe pulmonary arterial hypertension. Continue Flolan per cardiology services as well as Toprol-XL 100 mg daily, Imdur 90 mg daily, Lasix 80 mg daily. Monitor with increased mobility  9. Diabetes mellitus with peripheral neuropathy. Latest hemoglobin A1c 6.3. Lantus insulin 8 units each bedtime. Check blood sugars a.c. and at bedtime  10. Hypothyroidism. Continue Synthroid  11. Hyperlipidemia.Vytorin  12. Recurrent Clostridium difficile. Contact precautions. Vancomycin 125 mg 4 times a day x14 days initiated 08/01/2014  13. COPD/chronic respiratory failure. Oxygen therapy as directed    LOS (Days) 3 A FACE TO FACE EVALUATION WAS PERFORMED  KIRSTEINS,ANDREW E 08/06/2014, 9:28 AM

## 2014-08-07 ENCOUNTER — Inpatient Hospital Stay (HOSPITAL_COMMUNITY): Payer: Medicare Other | Admitting: Physical Therapy

## 2014-08-07 ENCOUNTER — Inpatient Hospital Stay (HOSPITAL_COMMUNITY): Payer: Medicare Other | Admitting: Occupational Therapy

## 2014-08-07 DIAGNOSIS — S72001S Fracture of unspecified part of neck of right femur, sequela: Secondary | ICD-10-CM

## 2014-08-07 LAB — BASIC METABOLIC PANEL
Anion gap: 10 (ref 5–15)
BUN: 15 mg/dL (ref 6–23)
CALCIUM: 8.9 mg/dL (ref 8.4–10.5)
CO2: 29 mEq/L (ref 19–32)
Chloride: 92 mEq/L — ABNORMAL LOW (ref 96–112)
Creatinine, Ser: 0.97 mg/dL (ref 0.50–1.10)
GFR calc non Af Amer: 58 mL/min — ABNORMAL LOW (ref >90)
GFR, EST AFRICAN AMERICAN: 68 mL/min — AB (ref >90)
Glucose, Bld: 179 mg/dL — ABNORMAL HIGH (ref 70–99)
POTASSIUM: 4.9 meq/L (ref 3.7–5.3)
SODIUM: 131 meq/L — AB (ref 137–147)

## 2014-08-07 LAB — GLUCOSE, CAPILLARY
GLUCOSE-CAPILLARY: 156 mg/dL — AB (ref 70–99)
GLUCOSE-CAPILLARY: 153 mg/dL — AB (ref 70–99)
Glucose-Capillary: 149 mg/dL — ABNORMAL HIGH (ref 70–99)
Glucose-Capillary: 156 mg/dL — ABNORMAL HIGH (ref 70–99)

## 2014-08-07 MED ORDER — METOPROLOL SUCCINATE ER 50 MG PO TB24
50.0000 mg | ORAL_TABLET | Freq: Every day | ORAL | Status: DC
  Administered 2014-08-07 – 2014-08-17 (×11): 50 mg via ORAL
  Filled 2014-08-07 (×13): qty 1

## 2014-08-07 NOTE — Progress Notes (Signed)
Physical Therapy Session Note  Patient Details  Name: Meagan [name] MRN: [id number] Date of Birth: [birth year]  Today's Date: 08/07/2014 PT Individual Time: 1000-1100 PT Individual Time Calculation (min): 60 min   Short Term Goals: Week 1:  PT Short Term Goal 1 (Week 1): Pt will demonstrate supine to sit transfer req mod A.  PT Short Term Goal 2 (Week 1): Pt will demonstrate sit to supine req SBA.  PT Short Term Goal 3 (Week 1): Pt will transfer w/c to/from bed req min A.  PT Short Term Goal 4 (Week 1): Pt will ambulate 15' with walker req min A.  PT Short Term Goal 5 (Week 1): Pt will self propel manual w/c x 50' req mod A.   Skilled Therapeutic Interventions/Progress Updates:  Pt was seen bedside in the am. PT c/o pain in R hip, nursing at bedside providing pain medication. Pt willing to participate with therapy. Pt transferred supine to edge of bed with side rail and max A. Pt transferred edge of bed to w/c with max A. Pt propelled w/c about 25 feet with B UEs and S with verbal cues. Pt transferred w/c to edge of mat with mod A. Pt transferred edge of mat to supine with max A. While on mat pt performed LE exercises for LE strengthening and flexibility. Pt transferred supine to edge of mat with max A. Pt transferred edge of mat to w/c with min A. Pt returned to room and left sitting up in w/c with call bell within reach.   Therapy Documentation Precautions:  Precautions Precautions: Anterior Hip Precaution Comments: 6L O2 (used this at home); and medication pump Restrictions Weight Bearing Restrictions: Yes RLE Weight Bearing: Weight bearing as tolerated General:   Pain: Pt c/o 8/10 pain R hip , medicated at beginning of treatment.   See FIM for current functional status  Therapy/Group: Individual Therapy  [name] 08/07/2014, 12:54 PM

## 2014-08-07 NOTE — Progress Notes (Signed)
68 y.o. right-handed female with history of systolic congestive heart failure, severe PAH(pulmonary arterial hypertension) maintained on continuous flofan infusion, fibromyalgia, COPD with chronic oxygen of 6 L, CAD with CABG and diabetes mellitus with peripheral neuropathy. Independent prior to admission living with her husband and used a walker outside of the home. Presented 07/27/2014 after mechanical fall. Denies loss of consciousness or associated chest pain or shortness or breath associated with fall. X-rays and imaging revealed a right hip displaced and angulated femoral neck fracture. Cardiology followup preoperatively and cleared for surgery. Underwent right total hip arthroplasty 07/29/2014 per Dr. Ninfa Linden. Weightbearing as tolerated with anterior hip precautions. Hospital course pain management. Subcutaneous Lovenox for DVT prophylaxis. Acute blood loss anemia and transfuse   Subjective/Complaints: Low BP noted by RN otherwise no c/os Appreciate cardiology note Review of Systems - Respiratory ROS: no cough, shortness of breath, or wheezing positive for - "non specific breathing problem" Objective: Vital Signs: Blood pressure 112/43, pulse 92, temperature 98.4 F (36.9 C), temperature source Oral, resp. rate 20, weight 68.448 kg (150 lb 14.4 oz), SpO2 100.00%. No results found. Results for orders placed during the hospital encounter of 08/03/14 (from the past 72 hour(s))  GLUCOSE, CAPILLARY     Status: Abnormal   Collection Time    08/04/14 11:59 AM      Result Value Ref Range   Glucose-Capillary 159 (*) 70 - 99 mg/dL  GLUCOSE, CAPILLARY     Status: Abnormal   Collection Time    08/04/14  3:35 PM      Result Value Ref Range   Glucose-Capillary 286 (*) 70 - 99 mg/dL  GLUCOSE, CAPILLARY     Status: Abnormal   Collection Time    08/04/14  9:05 PM      Result Value Ref Range   Glucose-Capillary 166 (*) 70 - 99 mg/dL   Comment 1 Notify RN    GLUCOSE, CAPILLARY     Status: Abnormal    Collection Time    08/05/14  6:49 AM      Result Value Ref Range   Glucose-Capillary 135 (*) 70 - 99 mg/dL  URINE CULTURE     Status: None   Collection Time    08/05/14 10:57 AM      Result Value Ref Range   Specimen Description URINE, CLEAN CATCH     Special Requests NONE     Culture  Setup Time       Value: 08/05/2014 18:21     Performed at [facility]       Value: >=100,000 COLONIES/ML     Performed at [facility]   Culture       Value: Multiple bacterial morphotypes present, none predominant. Suggest appropriate recollection if clinically indicated.     Performed at [facility]   Report Status 08/06/2014 FINAL    URINALYSIS, ROUTINE W REFLEX MICROSCOPIC     Status: Abnormal   Collection Time    08/05/14 10:57 AM      Result Value Ref Range   Color, Urine YELLOW  YELLOW   APPearance CLOUDY (*) CLEAR   Specific Gravity, Urine 1.015  1.005 - 1.030   pH 6.5  5.0 - 8.0   Glucose, UA NEGATIVE  NEGATIVE mg/dL   Hgb urine dipstick SMALL (*) NEGATIVE   Bilirubin Urine NEGATIVE  NEGATIVE   Ketones, ur NEGATIVE  NEGATIVE mg/dL   Protein, ur NEGATIVE  NEGATIVE mg/dL   Urobilinogen, UA 0.2  0.0 - 1.0 mg/dL   Nitrite NEGATIVE  NEGATIVE   Leukocytes, UA LARGE (*) NEGATIVE  URINE MICROSCOPIC-ADD ON     Status: Abnormal   Collection Time    08/05/14 10:57 AM      Result Value Ref Range   Squamous Epithelial / LPF FEW (*) RARE   WBC, UA TOO NUMEROUS TO COUNT  <3 WBC/hpf   RBC / HPF 0-2  <3 RBC/hpf   Bacteria, UA FEW (*) RARE   Urine-Other MANY YEAST    GLUCOSE, CAPILLARY     Status: Abnormal   Collection Time    08/05/14 11:19 AM      Result Value Ref Range   Glucose-Capillary 207 (*) 70 - 99 mg/dL   Comment 1 Notify RN    GLUCOSE, CAPILLARY     Status: Abnormal   Collection Time    08/05/14  4:30 PM      Result Value Ref Range   Glucose-Capillary 183 (*) 70 - 99 mg/dL  GLUCOSE, CAPILLARY     Status: Abnormal   Collection Time     08/05/14 10:01 PM      Result Value Ref Range   Glucose-Capillary 130 (*) 70 - 99 mg/dL  GLUCOSE, CAPILLARY     Status: Abnormal   Collection Time    08/06/14  7:20 AM      Result Value Ref Range   Glucose-Capillary 146 (*) 70 - 99 mg/dL  GLUCOSE, CAPILLARY     Status: Abnormal   Collection Time    08/06/14 11:52 AM      Result Value Ref Range   Glucose-Capillary 159 (*) 70 - 99 mg/dL  GLUCOSE, CAPILLARY     Status: Abnormal   Collection Time    08/06/14  4:21 PM      Result Value Ref Range   Glucose-Capillary 154 (*) 70 - 99 mg/dL  GLUCOSE, CAPILLARY     Status: None   Collection Time    08/06/14  9:45 PM      Result Value Ref Range   Glucose-Capillary 92  70 - 99 mg/dL  GLUCOSE, CAPILLARY     Status: Abnormal   Collection Time    08/07/14  7:40 AM      Result Value Ref Range   Glucose-Capillary 149 (*) 70 - 99 mg/dL      Constitutional: She is oriented to person, place, and time. She appears well-developed.  HENT: oral mucosa moist, dentition fair  Head: Normocephalic.  Eyes: EOM are normal.  Neck: Normal range of motion. Neck supple. No thyromegaly present.  Cardiovascular: Normal rate and regular rhythm.  Respiratory: Breath sounds normal. No respiratory distress.  GI: Soft. Bowel sounds are normal. She exhibits no distension.  Neurological: She is alert and oriented to person, place, and time. Fair insight and awareness.  Follows three-step commands. HOH sometimes limiting. UES: 4+ to 5/5. WOE:[id number] due to pain, 1/5 hf, 1+ KE and 4- ADF/APF. LLE grossly 3+ to 4+/5 prox to distal. No gross sensory findings.  Skin:   M/S: right hip with mild edema. Area very sensitive to touch.  Psychiatric:  Affect flat,  anxious   Assessment/Plan: 1. Functional deficits secondary to Right Femoral neck fracture which require 3+ hours per day of interdisciplinary therapy in a comprehensive inpatient rehab setting. Physiatrist is providing close team supervision and 24 hour  management of active medical problems listed below. Physiatrist and rehab team continue to assess barriers to discharge/monitor patient progress toward functional and medical  goals. FIM: FIM - Bathing Bathing Steps Patient Completed: Chest;Right Arm;Left Arm;Abdomen;Right upper leg;Left upper leg;Front perineal area;Buttocks Bathing: 4: Steadying assist  FIM - Upper Body Dressing/Undressing Upper body dressing/undressing steps patient completed: Thread/unthread right sleeve of pullover shirt/dresss;Thread/unthread left sleeve of pullover shirt/dress;Put head through opening of pull over shirt/dress;Pull shirt over trunk Upper body dressing/undressing: 5: Set-up assist to: Obtain clothing/put away FIM - Lower Body Dressing/Undressing Lower body dressing/undressing steps patient completed: Pull underwear up/down;Pull pants up/down;Thread/unthread left underwear leg;Thread/unthread right underwear leg;Thread/unthread right pants leg;Thread/unthread left pants leg Lower body dressing/undressing: 3: Mod-Patient completed 50-74% of tasks  FIM - Toileting Toileting steps completed by patient: Adjust clothing prior to toileting;Performs perineal hygiene;Adjust clothing after toileting Toileting Assistive Devices: Grab bar or rail for support Toileting: 5: Set-up assist to: Obtain supplies  FIM - Radio producer Devices: Walker;Grab bars Toilet Transfers: 4-From toilet/BSC: Min A (steadying Pt. > 75%);4-To toilet/BSC: Min A (steadying Pt. > 75%)  FIM - Control and instrumentation engineer Devices: Walker;Arm rests Bed/Chair Transfer: 3: Bed > Chair or W/C: Mod A (lift or lower assist);3: Supine > Sit: Mod A (lifting assist/Pt. 50-74%/lift 2 legs  FIM - Locomotion: Wheelchair Distance: 15 Locomotion: Wheelchair: 2: Travels 50 - 149 ft with minimal assistance (Pt.>75%) FIM - Locomotion: Ambulation Locomotion: Ambulation Assistive Devices:   (rollator) Ambulation/Gait Assistance: 4: Min assist Locomotion: Ambulation: 1: Travels less than 50 ft with minimal assistance (Pt.>75%)  Comprehension Comprehension Mode: Auditory Comprehension: 4-Understands basic 75 - 89% of the time/requires cueing 10 - 24% of the time  Expression Expression Mode: Verbal Expression: 6-Expresses complex ideas: With extra time/assistive device  Social Interaction Social Interaction: 5-Interacts appropriately 90% of the time - Needs monitoring or encouragement for participation or interaction.  Problem Solving Problem Solving: 4-Solves basic 75 - 89% of the time/requires cueing 10 - 24% of the time  Memory Memory: 3-Recognizes or recalls 50 - 74% of the time/requires cueing 25 - 49% of the time  Medical Problem List and Plan:  1. Functional deficits secondary to displaced right femoral neck fracture. 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 Weightbearing as tolerated with anterior hip precautions  2. DVT Prophylaxis/Anticoagulation: Subcutaneous Lovenox. Monitor platelet counts and any signs of bleeding. Check vascular study  3. Pain Management: Oxycodone as needed. Monitor with increased mobility, Cymbalta for Fibromyalgia  4. Mood/anxiety/depression: Xanax 1 mg each bedtime. Provide emotional support  5. Neuropsych: This patient is capable of making decisions on her own behalf.  6. Skin/Wound Care: Routine skin checks  7. Fluids/Electrolytes/Nutrition: Followup chemistries was strict I and Os. Provide nutritional supplements as needed  8. Severe pulmonary arterial hypertension. Continue Flolan per cardiology services as well as reduce todays dose ofToprol-XL 50 mg daily,Hold todays dose Imdur 90 mg daily,Cont  Lasix 80 mg daily. Monitor with increased mobility  9. Diabetes mellitus with peripheral neuropathy. Latest hemoglobin A1c 6.3. Lantus insulin 8 units each bedtime. Check blood sugars a.c. and at bedtime  10. Hypothyroidism. Continue  Synthroid  11. Hyperlipidemia.Vytorin  12. Recurrent Clostridium difficile. Contact precautions. Vancomycin 125 mg 4 times a day x14 days initiated 08/01/2014  13. COPD/chronic respiratory failure. Oxygen therapy as directed    LOS (Days) 4 A FACE TO FACE EVALUATION WAS PERFORMED  KIRSTEINS,ANDREW E 08/07/2014, 10:56 AM

## 2014-08-07 NOTE — Progress Notes (Signed)
Physical Therapy Session Note  Patient Details  Name: RABECCA BIRGE MRN: [id number] Date of Birth: 05/06/1946  Today's Date: 08/07/2014 PT Individual Time: 1400-1500 PT Individual Time Calculation (min): 60 min   Short Term Goals: Week 1:  PT Short Term Goal 1 (Week 1): Pt will demonstrate supine to sit transfer req mod A.  PT Short Term Goal 2 (Week 1): Pt will demonstrate sit to supine req SBA.  PT Short Term Goal 3 (Week 1): Pt will transfer w/c to/from bed req min A.  PT Short Term Goal 4 (Week 1): Pt will ambulate 15' with walker req min A.  PT Short Term Goal 5 (Week 1): Pt will self propel manual w/c x 50' req mod A.   Skilled Therapeutic Interventions/Progress Updates:  Pt was seen bedside in the pm. Pt transferred supine to edge of bed with side rail and mod A. Pt transferred edge of bed to w/c with 4ww with brakes on and min A. Pt propelled w/c about 25 feet with B UEs and S. Pt ambulated in gym with [phone] with brakes x 6 for 10 feet each time with min A and verbal cues.    Therapy Documentation Precautions:  Precautions Precautions: Anterior Hip Precaution Comments: 6L O2 (used this at home); and medication pump Restrictions Weight Bearing Restrictions: Yes RLE Weight Bearing: Weight bearing as tolerated General:   Pain: Pt c/o 7/10 pain R hip.   Locomotion : Ambulation Ambulation/Gait Assistance: 4: Min assist   See FIM for current functional status  Therapy/Group: Individual Therapy  [name] 08/07/2014, 3:09 PM

## 2014-08-07 NOTE — Progress Notes (Signed)
Occupational Therapy Session Note  Patient Details  Name: Meagan [name] MRN: [id number] Date of Birth: 02-28-1946  Today's Date: 08/07/2014 OT Individual Time: 6962-9528 OT Individual Time Calculation (min): 67 min    Skilled Therapeutic Interventions/Progress Updates:    Patient completed toileting in bathroom and then ADL in w/c at sink.    Focus on endurance and working through [employer] and surgical pain, and anxiety from pain and fatigue by sessions end.  Patient was able to complete sit to stand with very low endurance.  Also educated patient and husband on energy conservation and not leaning forward too long in order to decrease work load on lungs and heart during functional activities.   Husband concerned about the tubes and medication for patient's lung apparatus that is being managed by the IV team and pharmacy.   He and IV nurse spoke at length about the resolution.  Therapy Documentation Precautions:  Precautions Precautions: Anterior Hip Precaution Comments: 6L O2 (used this at home); and medication pump Restrictions Weight Bearing Restrictions: Yes RLE Weight Bearing: Weight bearing as tolerated  Pain:8/10 in right frontal thigh. Constant.   RN alerted for pain medication  See FIM for current functional status  Therapy/Group: Individual Therapy  Alfredia Ferguson [facility] 08/07/2014, 12:51 PM

## 2014-08-07 NOTE — Progress Notes (Signed)
08-07-14   1235   IV Team Note;  Pt has single lumen Hickman cathter with continuous Flolan infusing on her home CADD pump;  [name] has been responsible for doing drsg changes, cartridge and tubing changes when medication is running low;  [name]  asked for iv supplies earlier this am; spoke with Janett Billow in pharmacy: per Clair Gulling in pharmacy, the hospital supplies the drug and cartridge, but not tubing;  Tubing to be delivered later today from [place];  I spoke with Janace Hoard, RN from [facility] regarding situation, as Rapid Response RN and IV Team have not been trained to work with this CADD pump and tubing;  Per Angie, "if the pt isn't on the hospital pump, the husband may continue to do all care related to the pump, if he's comfortable and has been trained;"  She also said that "the nurses should be doing the dressing changes, not the pt's husband;" dressing was changed; site wnl; husband very comfortable continuing to take care of the CADD pump as he has been trained;  Rapid Response RN, Langley Gauss, aware of situation; staff RNs aware of plan of care;   Raynelle Fanning RN IV Team

## 2014-08-08 ENCOUNTER — Inpatient Hospital Stay (HOSPITAL_COMMUNITY): Payer: Medicare Other | Admitting: Occupational Therapy

## 2014-08-08 LAB — GLUCOSE, CAPILLARY
GLUCOSE-CAPILLARY: 169 mg/dL — AB (ref 70–99)
Glucose-Capillary: 135 mg/dL — ABNORMAL HIGH (ref 70–99)
Glucose-Capillary: 168 mg/dL — ABNORMAL HIGH (ref 70–99)
Glucose-Capillary: 173 mg/dL — ABNORMAL HIGH (ref 70–99)

## 2014-08-08 NOTE — Plan of Care (Signed)
Problem: RH PAIN MANAGEMENT Goal: RH STG PAIN MANAGED AT OR BELOW PT'S PAIN GOAL 3/10  Outcome: Not Progressing Patient pain remains at least a 7 out of 10 with PRN Percocet.

## 2014-08-08 NOTE — Progress Notes (Signed)
Occupational Therapy Session Note  Patient Details  Name: Meagan [name] MRN: [id number] Date of Birth: [birth year]  Today's Date: 08/08/2014 OT Individual Time: 5701-7793 OT Individual Time Calculation (min): 60 min   Skilled Therapeutic Interventions/Progress Updates: ADL in room in w/c at sink and toileting with overall min to mod A for feet and buttocks with pain and decreased endurance being inhibiting contributors.     Therapy Documentation Precautions:  Precautions Precautions: Anterior Hip Precaution Comments: 6L O2 (used this at home); and medication pump Restrictions Weight Bearing Restrictions: Yes RLE Weight Bearing: Weight bearing as tolerated  Pain: not rated "If I had my meds I take at home, I would not be in so much pain, and I could do better."  RN had given meds per patient.  See FIM for current functional status  Therapy/Group: Individual Therapy  Herschell Dimes 08/08/2014, 5:13 PM

## 2014-08-08 NOTE — Progress Notes (Signed)
68 y.o. right-handed female with history of systolic congestive heart failure, severe PAH(pulmonary arterial hypertension) maintained on continuous flofan infusion, fibromyalgia, COPD with chronic oxygen of 6 L, CAD with CABG and diabetes mellitus with peripheral neuropathy. Independent prior to admission living with her husband and used a walker outside of the home. Presented 07/27/2014 after mechanical fall. Denies loss of consciousness or associated chest pain or shortness or breath associated with fall. X-rays and imaging revealed a right hip displaced and angulated femoral neck fracture. Cardiology followup preoperatively and cleared for surgery. Underwent right total hip arthroplasty 07/29/2014 per Dr. Ninfa Linden. Weightbearing as tolerated with anterior hip precautions. Hospital course pain management. Subcutaneous Lovenox for DVT prophylaxis. Acute blood loss anemia and transfuse   Subjective/Complaints: BPs in nl range, tired but arouses to voice, husband in room  Review of Systems - Respiratory ROS: no cough, shortness of breath, or wheezing positive for - "non specific breathing problem" Objective: Vital Signs: Blood pressure 122/44, pulse 99, temperature 98 F (36.7 C), temperature source Oral, resp. rate 20, weight 68.448 kg (150 lb 14.4 oz), SpO2 100.00%. No results found. Results for orders placed during the hospital encounter of 08/03/14 (from the past 72 hour(s))  URINE CULTURE     Status: None   Collection Time    08/05/14 10:57 AM      Result Value Ref Range   Specimen Description URINE, CLEAN CATCH     Special Requests NONE     Culture  Setup Time       Value: 08/05/2014 18:21     Performed at [facility]       Value: >=100,000 COLONIES/ML     Performed at [facility]   Culture       Value: Multiple bacterial morphotypes present, none predominant. Suggest appropriate recollection if clinically indicated.     Performed at [facility]   Report Status 08/06/2014 FINAL    URINALYSIS, ROUTINE W REFLEX MICROSCOPIC     Status: Abnormal   Collection Time    08/05/14 10:57 AM      Result Value Ref Range   Color, Urine YELLOW  YELLOW   APPearance CLOUDY (*) CLEAR   Specific Gravity, Urine 1.015  1.005 - 1.030   pH 6.5  5.0 - 8.0   Glucose, UA NEGATIVE  NEGATIVE mg/dL   Hgb urine dipstick SMALL (*) NEGATIVE   Bilirubin Urine NEGATIVE  NEGATIVE   Ketones, ur NEGATIVE  NEGATIVE mg/dL   Protein, ur NEGATIVE  NEGATIVE mg/dL   Urobilinogen, UA 0.2  0.0 - 1.0 mg/dL   Nitrite NEGATIVE  NEGATIVE   Leukocytes, UA LARGE (*) NEGATIVE  URINE MICROSCOPIC-ADD ON     Status: Abnormal   Collection Time    08/05/14 10:57 AM      Result Value Ref Range   Squamous Epithelial / LPF FEW (*) RARE   WBC, UA TOO NUMEROUS TO COUNT  <3 WBC/hpf   RBC / HPF 0-2  <3 RBC/hpf   Bacteria, UA FEW (*) RARE   Urine-Other MANY YEAST    GLUCOSE, CAPILLARY     Status: Abnormal   Collection Time    08/05/14 11:19 AM      Result Value Ref Range   Glucose-Capillary 207 (*) 70 - 99 mg/dL   Comment 1 Notify RN    GLUCOSE, CAPILLARY     Status: Abnormal   Collection Time    08/05/14  4:30 PM  Result Value Ref Range   Glucose-Capillary 183 (*) 70 - 99 mg/dL  GLUCOSE, CAPILLARY     Status: Abnormal   Collection Time    08/05/14 10:01 PM      Result Value Ref Range   Glucose-Capillary 130 (*) 70 - 99 mg/dL  GLUCOSE, CAPILLARY     Status: Abnormal   Collection Time    08/06/14  7:20 AM      Result Value Ref Range   Glucose-Capillary 146 (*) 70 - 99 mg/dL  GLUCOSE, CAPILLARY     Status: Abnormal   Collection Time    08/06/14 11:52 AM      Result Value Ref Range   Glucose-Capillary 159 (*) 70 - 99 mg/dL  GLUCOSE, CAPILLARY     Status: Abnormal   Collection Time    08/06/14  4:21 PM      Result Value Ref Range   Glucose-Capillary 154 (*) 70 - 99 mg/dL  GLUCOSE, CAPILLARY     Status: None   Collection Time    08/06/14  9:45 PM       Result Value Ref Range   Glucose-Capillary 92  70 - 99 mg/dL  GLUCOSE, CAPILLARY     Status: Abnormal   Collection Time    08/07/14  7:40 AM      Result Value Ref Range   Glucose-Capillary 149 (*) 70 - 99 mg/dL  GLUCOSE, CAPILLARY     Status: Abnormal   Collection Time    08/07/14 12:01 PM      Result Value Ref Range   Glucose-Capillary 156 (*) 70 - 99 mg/dL  BASIC METABOLIC PANEL     Status: Abnormal   Collection Time    08/07/14  1:00 PM      Result Value Ref Range   Sodium 131 (*) 137 - 147 mEq/L   Potassium 4.9  3.7 - 5.3 mEq/L   Chloride 92 (*) 96 - 112 mEq/L   CO2 29  19 - 32 mEq/L   Glucose, Bld 179 (*) 70 - 99 mg/dL   BUN 15  6 - 23 mg/dL   Creatinine, Ser 0.97  0.50 - 1.10 mg/dL   Calcium 8.9  8.4 - 10.5 mg/dL   GFR calc non Af Amer 58 (*) >90 mL/min   GFR calc Af Amer 68 (*) >90 mL/min   Comment: (NOTE)     The eGFR has been calculated using the CKD EPI equation.     This calculation has not been validated in all clinical situations.     eGFR's persistently <90 mL/min signify possible Chronic Kidney     Disease.   Anion gap 10  5 - 15  GLUCOSE, CAPILLARY     Status: Abnormal   Collection Time    08/07/14  5:03 PM      Result Value Ref Range   Glucose-Capillary 153 (*) 70 - 99 mg/dL  GLUCOSE, CAPILLARY     Status: Abnormal   Collection Time    08/07/14  9:24 PM      Result Value Ref Range   Glucose-Capillary 156 (*) 70 - 99 mg/dL  GLUCOSE, CAPILLARY     Status: Abnormal   Collection Time    08/08/14  6:57 AM      Result Value Ref Range   Glucose-Capillary 135 (*) 70 - 99 mg/dL   Comment 1 Notify RN        Constitutional: She is oriented to person, place, and time. She appears well-developed.  HENT: oral mucosa moist, dentition fair  Head: Normocephalic.  Eyes: EOM are normal.  Neck: Normal range of motion. Neck supple. No thyromegaly present.  Cardiovascular: Normal rate and regular rhythm.  Respiratory: Breath sounds normal. No respiratory distress.   GI: Soft. Bowel sounds are normal. She exhibits no distension.  Neurological: She is alert and oriented to person, place, and time. Fair insight and awareness.  Follows three-step commands. HOH sometimes limiting. UES: 4+ to 5/5. POE:UMPNTIR due to pain, 1/5 hf, 1+ KE and 4- ADF/APF. LLE grossly 3+ to 4+/5 prox to distal. No gross sensory findings.  Skin:   M/S: right hip with mild edema. Area very sensitive to touch.  Psychiatric:  Affect flat,  anxious   Assessment/Plan: 1. Functional deficits secondary to Right Femoral neck fracture which require 3+ hours per day of interdisciplinary therapy in a comprehensive inpatient rehab setting. Physiatrist is providing close team supervision and 24 hour management of active medical problems listed below. Physiatrist and rehab team continue to assess barriers to discharge/monitor patient progress toward functional and medical goals. FIM: FIM - Bathing Bathing Steps Patient Completed: Chest;Right Arm;Left Arm;Abdomen;Front perineal area;Right upper leg;Left upper leg Bathing: 4: Min-Patient completes 8-9 63f10 parts or 75+ percent  FIM - Upper Body Dressing/Undressing Upper body dressing/undressing steps patient completed: Thread/unthread left sleeve of pullover shirt/dress;Pull shirt over trunk;Put head through opening of pull over shirt/dress;Thread/unthread right sleeve of pullover shirt/dresss Upper body dressing/undressing: 5: Set-up assist to: Obtain clothing/put away FIM - Lower Body Dressing/Undressing Lower body dressing/undressing steps patient completed: Thread/unthread right underwear leg;Thread/unthread left pants leg;Thread/unthread left underwear leg;Pull pants up/down;Pull underwear up/down;Fasten/unfasten pants;Thread/unthread right pants leg;Don/Doff right sock Lower body dressing/undressing: 3: Mod-Patient completed 50-74% of tasks  FIM - Toileting Toileting steps completed by patient: Adjust clothing prior to  toileting;Performs perineal hygiene;Adjust clothing after toileting Toileting Assistive Devices: Grab bar or rail for support Toileting: 4: Steadying assist  FIM - TRadio producerDevices: Grab bars Toilet Transfers: 4-To toilet/BSC: Min A (steadying Pt. > 75%);4-From toilet/BSC: Min A (steadying Pt. > 75%)  FIM - BControl and instrumentation engineerDevices: Walker;Bed rails;Arm rests Bed/Chair Transfer: 3: Supine > Sit: Mod A (lifting assist/Pt. 50-74%/lift 2 legs;4: Bed > Chair or W/C: Min A (steadying Pt. > 75%)  FIM - Locomotion: Wheelchair Distance: 15 Locomotion: Wheelchair: 1: Travels less than 50 ft with supervision, cueing or coaxing FIM - Locomotion: Ambulation Locomotion: Ambulation Assistive Devices:  [phone] brakes on) Ambulation/Gait Assistance: 4: Min assist Locomotion: Ambulation: 2: Travels 50 - 149 ft with minimal assistance (Pt.>75%)  Comprehension Comprehension Mode: Auditory Comprehension: 5-Follows basic conversation/direction: With extra time/assistive device  Expression Expression Mode: Verbal Expression: 6-Expresses complex ideas: With extra time/assistive device  Social Interaction Social Interaction: 6-Interacts appropriately with others with medication or extra time (anti-anxiety, antidepressant).  Problem Solving Problem Solving: 5-Solves basic 90% of the time/requires cueing < 10% of the time  Memory Memory: 6-More than reasonable amt of time  Medical Problem List and Plan:  1. Functional deficits secondary to displaced right femoral neck fracture. 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 Weightbearing as tolerated with anterior hip precautions  2. DVT Prophylaxis/Anticoagulation: Subcutaneous Lovenox. Monitor platelet counts and any signs of bleeding. Check vascular study  3. Pain Management: Oxycodone as needed. Monitor with increased mobility, Cymbalta for Fibromyalgia  4.  Mood/anxiety/depression: Xanax 1 mg each bedtime. Provide emotional support  5. Neuropsych: This patient is capable of making decisions on her own behalf.  6. Skin/Wound  Care: Routine skin checks  7. Fluids/Electrolytes/Nutrition: Followup chemistries was strict I and Os. Provide nutritional supplements as needed  8. Severe pulmonary arterial hypertension. Continue Flolan per cardiology services as well as reduce  Toprol-XL 50 mg daily,HR in usual range thus far  restart Imdur 90 mg daily,Cont  Lasix 80 mg daily. Monitor with increased mobility  9. Diabetes mellitus with peripheral neuropathy. Latest hemoglobin A1c 6.3. Lantus insulin 8 units each bedtime. Check blood sugars a.c. and at bedtime  10. Hypothyroidism. Continue Synthroid  11. Hyperlipidemia.Vytorin  12. Recurrent Clostridium difficile. Contact precautions. Vancomycin 125 mg 4 times a day x14 days initiated 08/01/2014  13. COPD/chronic respiratory failure. Oxygen therapy as directed  14.  UTI yeast add diflucan  LOS (Days) 5 A FACE TO FACE EVALUATION WAS PERFORMED  Sharea Guinther E 08/08/2014, 10:34 AM

## 2014-08-09 ENCOUNTER — Inpatient Hospital Stay (HOSPITAL_COMMUNITY): Payer: Medicare Other | Admitting: Rehabilitation

## 2014-08-09 ENCOUNTER — Inpatient Hospital Stay (HOSPITAL_COMMUNITY): Payer: Medicare Other | Admitting: Occupational Therapy

## 2014-08-09 ENCOUNTER — Inpatient Hospital Stay (HOSPITAL_COMMUNITY): Payer: Medicare Other

## 2014-08-09 DIAGNOSIS — E1165 Type 2 diabetes mellitus with hyperglycemia: Secondary | ICD-10-CM

## 2014-08-09 DIAGNOSIS — M797 Fibromyalgia: Secondary | ICD-10-CM

## 2014-08-09 LAB — GLUCOSE, CAPILLARY
GLUCOSE-CAPILLARY: 163 mg/dL — AB (ref 70–99)
GLUCOSE-CAPILLARY: 172 mg/dL — AB (ref 70–99)
Glucose-Capillary: 108 mg/dL — ABNORMAL HIGH (ref 70–99)
Glucose-Capillary: 180 mg/dL — ABNORMAL HIGH (ref 70–99)

## 2014-08-09 MED ORDER — HYDROMORPHONE HCL 2 MG PO TABS
4.0000 mg | ORAL_TABLET | Freq: Two times a day (BID) | ORAL | Status: DC
  Administered 2014-08-09 – 2014-08-17 (×17): 4 mg via ORAL
  Filled 2014-08-09 (×16): qty 2

## 2014-08-09 MED ORDER — PH 12 STERILE DILUENT
33.4500 ng/kg/min | INTRAVENOUS | Status: DC
  Administered 2014-08-09 – 2014-08-16 (×8): 33.45 ng/kg/min via INTRAVENOUS
  Filled 2014-08-09 (×9): qty 15

## 2014-08-09 MED ORDER — HYDROMORPHONE HCL 2 MG PO TABS
4.0000 mg | ORAL_TABLET | Freq: Three times a day (TID) | ORAL | Status: DC | PRN
  Administered 2014-08-09 – 2014-08-10 (×2): 4 mg via ORAL
  Filled 2014-08-09 (×2): qty 2

## 2014-08-09 MED ORDER — HYDROMORPHONE HCL 2 MG PO TABS: 4.0000 mg | ORAL_TABLET | Freq: Two times a day (BID) | ORAL | Status: DC | PRN

## 2014-08-09 NOTE — Progress Notes (Addendum)
Occupational Therapy Session Note  Patient Details  Name: Meagan [name] MRN: [id number] Date of Birth: 03-15-1946  Today's Date: 08/09/2014 OT Individual Time: 7616-0737 OT Individual Time Calculation (min): 47 min    Short Term Goals: Week 1:  OT Short Term Goal 1 (Week 1): Pt will complete UB/LB dressing using AE prn w/ min A. OT Short Term Goal 2 (Week 1): Pt will complete UB/LB bathing using AE prn w/ min A. OT Short Term Goal 3 (Week 1): Pt will complete toilet t/f onto 3-in-1 commode w/ min A. OT Short Term Goal 4 (Week 1): Pt will be educated on UE strengthening and ROM HEP.  Skilled Therapeutic Interventions/Progress Updates:    Pt transferred supine to sit EOB with min assist to begin session.  Only occasional min assist noted to help move the RLE to the EOB during the task.  Pain stated at 7/10 in the RLE and she reported having pain meds prior to session.  She was able to transfer stand pivot to the wheelchair with use of the 4 wheeled walker, but keeps the rear wheels locked.  Per husbands report this method worked better with PT sessions over the weekend and last week.  She needed only min guard assist for sit to stand and for pivot to the wheelchair but required mod instructional cueing for hand placement as pt attempts to pull up on the walker.  She was able to perform all UB bathing and dressing with setup only.  LB bathing min assist for washing the right foot but she was able to wash all others including bringing up the LLE over the right knee to wash her foot and donn her gripper sock.  Oxygen sats maintained at 97% on 6Ls nasal cannula during bathing.  Pt did exhibit mild dyspnea at 2/4 after standing tasks.  Integrated reacher for drying the right foot as well as for threading her underpants.  She was able to thread her pants without using the reacher.  Introduced sock aide for donning the right sock, which she was able to complete after setup and mod demonstrational cueing.   Pt with min guard assist for all sit to stand transitions during session.  Still with increased pain in the RLE with standing but did not affect her ADL function this session.  Pt left at the sink with her husband to finish grooming tasks.    Therapy Documentation Precautions:  Precautions Precautions: Anterior Hip Precaution Booklet Issued: No Precaution Comments: 6L O2 (used this at home); and medication pump Restrictions Weight Bearing Restrictions: No RLE Weight Bearing: Weight bearing as tolerated  Pain: Pain Assessment Pain Assessment: 0-10 Pain Score: 9  Pain Type: Acute pain Pain Location: Jaw Pain Orientation: Lower Pain Descriptors / Indicators: Aching Pain Frequency: Intermittent Pain Onset: Gradual Patients Stated Pain Goal: 3 Pain Intervention(s): Medication (See eMAR) ADL: See FIM for current functional status  Therapy/Group: Individual Therapy  Johnrobert Foti OTR/L 08/09/2014, 12:38 PM

## 2014-08-09 NOTE — Progress Notes (Signed)
Physical Therapy Session Note  Patient Details  Name: Meagan [name] MRN: [id number] Date of Birth: [birth year]  Today's Date: 08/09/2014 PT Individual Time: 1300-1345 PT Individual Time Calculation (min): 45 min   Short Term Goals: Week 1:  PT Short Term Goal 1 (Week 1): Pt will demonstrate supine to sit transfer req mod A.  PT Short Term Goal 2 (Week 1): Pt will demonstrate sit to supine req SBA.  PT Short Term Goal 3 (Week 1): Pt will transfer w/c to/from bed req min A.  PT Short Term Goal 4 (Week 1): Pt will ambulate 15' with walker req min A.  PT Short Term Goal 5 (Week 1): Pt will self propel manual w/c x 50' req mod A.   Skilled Therapeutic Interventions/Progress Updates:    Session focused on functional bed mobility, basic transfers (sit to stands), stair negotiation for preparation for home entry, and w/c mobility to work on overall activity tolerance, functional strengthening, and functional mobility. Pt required frequent rest breaks and cues for pursed lip breathing. Overall min A for sit to stands with cues for hand placement and technique especially controlling descent to the chair. Mod A for step up/down of 1 step with bilateral rails. Pt required seated rest break after each step attempt (repeated 5 times total). Pt's steps at home are 2 without rails and discussed technique with RW or recommendation to install rails. [name] said he would look into that.  Therapy Documentation Precautions:  Precautions Precautions: Anterior Hip Precaution Booklet Issued: No Precaution Comments: 6L O2 (used this at home); and medication pump Restrictions Weight Bearing Restrictions: No RLE Weight Bearing: Weight bearing as tolerated  Oxygen Therapy SpO2: 100 % O2 Device: Nasal cannula O2 Flow Rate (L/min): 6 L/min  Pain: C/o jaw pain - RN aware and premedicated. See FIM for current functional status  Therapy/Group: Individual Therapy  [name] [facility] 08/09/2014, 3:47  PM

## 2014-08-09 NOTE — Progress Notes (Signed)
Subjective:   Remains in rehab. Apparently BP low over weekend but better now. She denies any dizziness. Main complaint is jaw pain. Asking for more dilaudid. No recent weight. Urine looks clear. Being treated for c. Diff.     Intake/Output Summary (Last 24 hours) at 08/09/14 1227 Last data filed at 08/09/14 0745  Gross per 24 hour  Intake    240 ml  Output      0 ml  Net    240 ml    Current meds: . ALPRAZolam  1 mg Oral QHS  . diclofenac sodium  2 g Topical QID  . DULoxetine  30 mg Oral QHS  . DULoxetine  60 mg Oral Daily  . enoxaparin (LOVENOX) injection  40 mg Subcutaneous Q24H  . ezetimibe-simvastatin  1 tablet Oral QHS  . fluconazole  100 mg Oral Daily  . furosemide  80 mg Oral Daily  . HYDROmorphone  4 mg Oral Q12H  . ice pack  1 each Other 3 times per day  . insulin aspart  0-15 Units Subcutaneous TID WC  . insulin glargine  8 Units Subcutaneous QHS  . isosorbide mononitrate  90 mg Oral q morning - 10a  . levothyroxine  25 mcg Oral QAC breakfast  . metoprolol succinate  50 mg Oral Daily  . pantoprazole  40 mg Oral Q1200  . potassium chloride SA  40 mEq Oral Daily  . vancomycin  125 mg Oral 4 times per day   Infusions: . epoprostenol 33.45 ng/kg/min (08/06/14 1758)     Objective:  Blood pressure 135/56, pulse 106, temperature 98.3 F (36.8 C), temperature source Oral, resp. rate 18, weight 68.448 kg (150 lb 14.4 oz), SpO2 100.00%. Weight change:  Physical Exam:  General: Chronically-ill appearing. Lying flat in bed Wearing [place] O2 HEENT: normal  Neck: thick . JVP ~5 Carotids 2+ bilat; no bruits. No lymphadenopathy or thryomegaly appreciated.  Cor: PMI nonpalpable Regular tachy. 2/6 TR. P2 perhaps mildly accentuated but not severely so  Hickman catheter ok  Lungs: clear but decreased throughout no wheezing  Abdomen: obese. soft, nontender, nondistended. No hepatosplenomegaly. No bruits or masses. hypoactivends.  Extremities: no cyanosis, clubbing  Neuro:  alert & oriented x 3, cranial nerves grossly intact. moves all 4 extremities w/o difficulty. Affect pleasant.   Lab Results: Basic Metabolic Panel:  Recent Labs Lab 08/03/14 0505 08/04/14 0622 08/07/14 1300  NA 132* 133* 131*  K 4.0 4.4 4.9  CL 91* 92* 92*  CO2 29 29 29   GLUCOSE 140* 135* 179*  BUN 16 15 15   CREATININE 0.88 0.81 0.97  CALCIUM 8.7 8.8 8.9  MG 1.8  --   --    Liver Function Tests:  Recent Labs Lab 08/04/14 0622  AST 13  ALT <5  ALKPHOS 101  BILITOT 0.4  PROT 5.9*  ALBUMIN 2.0*   No results found for this basename: LIPASE, AMYLASE,  in the last 168 hours No results found for this basename: AMMONIA,  in the last 168 hours CBC:  Recent Labs Lab 08/02/14 1919 08/04/14 0622  WBC 20.7* 15.8*  NEUTROABS  --  13.1*  HGB 8.9* 9.3*  HCT 26.6* 26.9*  MCV 83.4 83.8  PLT 249 304   Cardiac Enzymes: No results found for this basename: CKTOTAL, CKMB, CKMBINDEX, TROPONINI,  in the last 168 hours BNP: No components found with this basename: POCBNP,  CBG:  Recent Labs Lab 08/08/14 1207 08/08/14 1633 08/08/14 2152 08/09/14 0713 08/09/14 1115  GLUCAP 169*  168* 173* 108* 180*   Microbiology: Lab Results  Component Value Date   CULT  Value: Multiple bacterial morphotypes present, none predominant. Suggest appropriate recollection if clinically indicated. Performed at [facility] 08/05/2014   CULT  Value: NO GROWTH Performed at [facility] 06/03/2014   CULT NO GROWTH 5 DAYS 06/03/2014   CULT NO GROWTH 5 DAYS 06/03/2014   CULT NO GROWTH 3 DAYS 05/14/2012    Recent Labs Lab 08/05/14 1057  CULT Multiple bacterial morphotypes present, none predominant. Suggest appropriate recollection if clinically indicated. Performed at [place]    Imaging: No results found.   ASSESSMENT:  1. Hip fracture s/p repair 10/8 2. Severe PAH on flolan  3. CAD s/p CABG. LAD lesion on last cath does not appear to be  flow limiting  4. Chronic Diastolic HF  5. H/O Of GI bleed from esophagitis  6. Hypokalemia  7. Diarrhea H/O C diff  8. Hypothyroid  9. DM  10. C. Diff + 11. NSVT 12. Hypomagnesemia   PLAN/DISCUSSION:  Stable from cardiac perspective. Jaw pain may be from Flolan but unusual to get jaw claudication with stable dosing. Will give extra dose dilaudid and follow. BP stable. Will request daily weights. Can hold lasix for day or two if BP low.      LOS: 6 days  Glori Bickers, MD 08/09/2014, 12:27 PM

## 2014-08-09 NOTE — Progress Notes (Signed)
Occupational Therapy Session Note  Patient Details  Name: Meagan [name] MRN: [id number] Date of Birth: 04/15/1946  Today's Date: 08/09/2014 OT Individual Time: 2376-2831 OT Individual Time Calculation (min): 38 min    Short Term Goals: Week 1:  OT Short Term Goal 1 (Week 1): Pt will complete UB/LB dressing using AE prn w/ min A. OT Short Term Goal 2 (Week 1): Pt will complete UB/LB bathing using AE prn w/ min A. OT Short Term Goal 3 (Week 1): Pt will complete toilet t/f onto 3-in-1 commode w/ min A. OT Short Term Goal 4 (Week 1): Pt will be educated on UE strengthening and ROM HEP.  Skilled Therapeutic Interventions/Progress Updates:    Pt was rolled down to the ADL apartment to practice bed transfers.  Pain rated at 5-6 on the faces scale in the right hip. Husband was also present for session and supportive.  Pt continues to demonstrate increased anxiety and decreased memory for transfers, requiring mod instructional cueing for hand placement for sit to stand.  She was able to transfer from the wheelchair to the EOB with min guard assist.  Decreased ability to sequence walker and stepping.  Min assist for lifting the RLE into the bed and sliding it over.  Pt also needed min assist for transitioning it back to the EOB for sitting up as well.  Pt transferred back to the wheelchair and then transitioned back to the room.  Performed short distance ambulation to the bathroom for toileting with min assist.  Pt left on the toilet with husband present and call button within reach.  Oxygen sats during session 97% on 6Ls nasal cannula.  Therapy Documentation Precautions:  Precautions Precautions: Anterior Hip Precaution Booklet Issued: No Precaution Comments: 6L O2 (used this at home); and medication pump Restrictions Weight Bearing Restrictions: No RLE Weight Bearing: Weight bearing as tolerated  Pain: Pain Assessment Pain Assessment: Faces Pain Score: 6  Faces Pain Scale: Hurts even  more Pain Type: Acute pain Pain Location: Hip Pain Intervention(s): Repositioned;Emotional support ADL: See FIM for current functional status  Therapy/Group: Individual Therapy  Dionisios Ricci OTR/L 08/09/2014, 5:03 PM

## 2014-08-09 NOTE — Progress Notes (Signed)
68 y.o. right-handed female with history of systolic congestive heart failure, severe PAH(pulmonary arterial hypertension) maintained on continuous flofan infusion, fibromyalgia, COPD with chronic oxygen of 6 L, CAD with CABG and diabetes mellitus with peripheral neuropathy. Independent prior to admission living with her husband and used a walker outside of the home. Presented 07/27/2014 after mechanical fall. Denies loss of consciousness or associated chest pain or shortness or breath associated with fall. X-rays and imaging revealed a right hip displaced and angulated femoral neck fracture. Cardiology followup preoperatively and cleared for surgery. Underwent right total hip arthroplasty 07/29/2014 per Dr. Ninfa Linden. Weightbearing as tolerated with anterior hip precautions. Hospital course pain management. Subcutaneous Lovenox for DVT prophylaxis. Acute blood loss anemia and transfuse   Subjective/Complaints: Pain remains a problem. Husband asked if she could get back on dilaudid she used before hospital---"percocet not cutting it"  Review of Systems - Respiratory ROS: no cough, shortness of breath, or wheezing positive for - "non specific breathing problem" Objective: Vital Signs: Blood pressure 125/53, pulse 100, temperature 98.3 F (36.8 C), temperature source Oral, resp. rate 18, weight 68.448 kg (150 lb 14.4 oz), SpO2 100.00%. No results found. Results for orders placed during the hospital encounter of 08/03/14 (from the past 72 hour(s))  GLUCOSE, CAPILLARY     Status: Abnormal   Collection Time    08/06/14 11:52 AM      Result Value Ref Range   Glucose-Capillary 159 (*) 70 - 99 mg/dL  GLUCOSE, CAPILLARY     Status: Abnormal   Collection Time    08/06/14  4:21 PM      Result Value Ref Range   Glucose-Capillary 154 (*) 70 - 99 mg/dL  GLUCOSE, CAPILLARY     Status: None   Collection Time    08/06/14  9:45 PM      Result Value Ref Range   Glucose-Capillary 92  70 - 99 mg/dL  GLUCOSE,  CAPILLARY     Status: Abnormal   Collection Time    08/07/14  7:40 AM      Result Value Ref Range   Glucose-Capillary 149 (*) 70 - 99 mg/dL  GLUCOSE, CAPILLARY     Status: Abnormal   Collection Time    08/07/14 12:01 PM      Result Value Ref Range   Glucose-Capillary 156 (*) 70 - 99 mg/dL  BASIC METABOLIC PANEL     Status: Abnormal   Collection Time    08/07/14  1:00 PM      Result Value Ref Range   Sodium 131 (*) 137 - 147 mEq/L   Potassium 4.9  3.7 - 5.3 mEq/L   Chloride 92 (*) 96 - 112 mEq/L   CO2 29  19 - 32 mEq/L   Glucose, Bld 179 (*) 70 - 99 mg/dL   BUN 15  6 - 23 mg/dL   Creatinine, Ser 0.97  0.50 - 1.10 mg/dL   Calcium 8.9  8.4 - 10.5 mg/dL   GFR calc non Af Amer 58 (*) >90 mL/min   GFR calc Af Amer 68 (*) >90 mL/min   Comment: (NOTE)     The eGFR has been calculated using the CKD EPI equation.     This calculation has not been validated in all clinical situations.     eGFR's persistently <90 mL/min signify possible Chronic Kidney     Disease.   Anion gap 10  5 - 15  GLUCOSE, CAPILLARY     Status: Abnormal   Collection Time  08/07/14  5:03 PM      Result Value Ref Range   Glucose-Capillary 153 (*) 70 - 99 mg/dL  GLUCOSE, CAPILLARY     Status: Abnormal   Collection Time    08/07/14  9:24 PM      Result Value Ref Range   Glucose-Capillary 156 (*) 70 - 99 mg/dL  GLUCOSE, CAPILLARY     Status: Abnormal   Collection Time    08/08/14  6:57 AM      Result Value Ref Range   Glucose-Capillary 135 (*) 70 - 99 mg/dL   Comment 1 Notify RN    GLUCOSE, CAPILLARY     Status: Abnormal   Collection Time    08/08/14 12:07 PM      Result Value Ref Range   Glucose-Capillary 169 (*) 70 - 99 mg/dL  GLUCOSE, CAPILLARY     Status: Abnormal   Collection Time    08/08/14  4:33 PM      Result Value Ref Range   Glucose-Capillary 168 (*) 70 - 99 mg/dL  GLUCOSE, CAPILLARY     Status: Abnormal   Collection Time    08/08/14  9:52 PM      Result Value Ref Range    Glucose-Capillary 173 (*) 70 - 99 mg/dL  GLUCOSE, CAPILLARY     Status: Abnormal   Collection Time    08/09/14  7:13 AM      Result Value Ref Range   Glucose-Capillary 108 (*) 70 - 99 mg/dL   Comment 1 Notify RN        Constitutional: She is oriented to person, place, and time. She appears well-developed.  HENT: oral mucosa moist, dentition fair  Head: Normocephalic.  Eyes: EOM are normal.  Neck: Normal range of motion. Neck supple. No thyromegaly present.  Cardiovascular: Normal rate and regular rhythm.  Respiratory: Breath sounds normal. No respiratory distress.  GI: Soft. Bowel sounds are normal. She exhibits no distension.  Neurological: She is alert and oriented to person, place, and time. Fair insight and awareness.  Follows three-step commands. HOH sometimes limiting. UES: 4+ to 5/5. UYQ:IHKVQQV due to pain, 1/5 hf, 1+ KE and 4- ADF/APF. LLE grossly 3+ to 4+/5 prox to distal. No gross sensory findings.  Skin: incision clean, some dry blood/clot proximally along incision  M/S: right hip with mild edema. Area remains very sensitive to touch.  Psychiatric:   Anxious still   Assessment/Plan: 1. Functional deficits secondary to Right Femoral neck fracture which require 3+ hours per day of interdisciplinary therapy in a comprehensive inpatient rehab setting. Physiatrist is providing close team supervision and 24 hour management of active medical problems listed below. Physiatrist and rehab team continue to assess barriers to discharge/monitor patient progress toward functional and medical goals. FIM: FIM - Bathing Bathing Steps Patient Completed: Chest;Left upper leg;Right Arm;Left Arm;Abdomen;Front perineal area;Right upper leg Bathing: 3: Mod-Patient completes 5-7 [id number] parts or 50-74%  FIM - Upper Body Dressing/Undressing Upper body dressing/undressing steps patient completed: Thread/unthread left sleeve of pullover shirt/dress;Thread/unthread right sleeve of pullover  shirt/dresss;Put head through opening of pull over shirt/dress;Pull shirt over trunk Upper body dressing/undressing: 5: Set-up assist to: Obtain clothing/put away FIM - Lower Body Dressing/Undressing Lower body dressing/undressing steps patient completed: Pull underwear up/down;Pull pants up/down;Thread/unthread left underwear leg;Thread/unthread left pants leg Lower body dressing/undressing: 3: Mod-Patient completed 50-74% of tasks  FIM - Toileting Toileting steps completed by patient: Adjust clothing prior to toileting;Adjust clothing after toileting Toileting Assistive Devices: Grab bar or rail  for support Toileting: 4: Steadying assist  FIM - Radio producer Devices: Grab bars Toilet Transfers: 4-To toilet/BSC: Min A (steadying Pt. > 75%);4-From toilet/BSC: Min A (steadying Pt. > 75%)  FIM - Control and instrumentation engineer Devices: Walker;Bed rails;Arm rests Bed/Chair Transfer: 4: Bed > Chair or W/C: Min A (steadying Pt. > 75%)  FIM - Locomotion: Wheelchair Distance: 15 Locomotion: Wheelchair: 1: Travels less than 50 ft with supervision, cueing or coaxing FIM - Locomotion: Ambulation Locomotion: Ambulation Assistive Devices:  [phone] with brakes on) Ambulation/Gait Assistance: 4: Min assist Locomotion: Ambulation: 2: Travels 50 - 149 ft with minimal assistance (Pt.>75%)  Comprehension Comprehension Mode: Auditory Comprehension: 5-Follows basic conversation/direction: With extra time/assistive device  Expression Expression Mode: Verbal Expression: 6-Expresses complex ideas: With extra time/assistive device  Social Interaction Social Interaction: 6-Interacts appropriately with others with medication or extra time (anti-anxiety, antidepressant).  Problem Solving Problem Solving: 5-Solves basic 90% of the time/requires cueing < 10% of the time  Memory Memory: 6-More than reasonable amt of time  Medical Problem List and Plan:  1.  Functional deficits secondary to displaced right femoral neck fracture. 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 Weightbearing as tolerated with anterior hip precautions  2. DVT Prophylaxis/Anticoagulation: Subcutaneous Lovenox. Monitor platelet counts and any signs of bleeding. Check vascular study  3. Pain Management: add scheduled and prn dilaudid. Dc percoet  -Cymbalta for Fibromyalgia  4. Mood/anxiety/depression: Xanax 1 mg each bedtime. Provide emotional support  5. Neuropsych: This patient is capable of making decisions on her own behalf.  6. Skin/Wound Care: Routine skin checks  7. Fluids/Electrolytes/Nutrition: Followup chemistries was strict I and Os. Provide nutritional supplements as needed  8. Severe pulmonary arterial hypertension. Continue Flolan per cardiology services as well as reduce  Toprol-XL 50 mg daily,HR in usual range thus far  restart Imdur 90 mg daily,Cont  Lasix 80 mg daily. Monitor with increased mobility  9. Diabetes mellitus with peripheral neuropathy. Latest hemoglobin A1c 6.3. Lantus insulin 8 units each bedtime.  -fair control at present 10. Hypothyroidism. Continue Synthroid  11. Hyperlipidemia.Vytorin  12. Recurrent Clostridium difficile. Contact precautions. Vancomycin 125 mg 4 times a day x14 days initiated 08/01/2014  13. COPD/chronic respiratory failure. Oxygen therapy as directed  14.  UTI yeast added diflucan through 10/23  LOS (Days) 6 A FACE TO FACE EVALUATION WAS PERFORMED  SWARTZ,ZACHARY T 08/09/2014, 8:28 AM

## 2014-08-09 NOTE — Progress Notes (Signed)
Physical Therapy Session Note  Patient Details  Name: Meagan [name] MRN: [id number] Date of Birth: November 12, 1945  Today's Date: 08/09/2014 PT Individual Time: 1000-1100 PT Individual Time Calculation (min): 60 min   Short Term Goals: Week 1:  PT Short Term Goal 1 (Week 1): Pt will demonstrate supine to sit transfer req mod A.  PT Short Term Goal 2 (Week 1): Pt will demonstrate sit to supine req SBA.  PT Short Term Goal 3 (Week 1): Pt will transfer w/c to/from bed req min A.  PT Short Term Goal 4 (Week 1): Pt will ambulate 15' with walker req min A.  PT Short Term Goal 5 (Week 1): Pt will self propel manual w/c x 50' req mod A.   Skilled Therapeutic Interventions/Progress Updates:   Pt received sitting in w/c in room, husband donning shoes.  Pt agreeable to therapy, but stating 8/10 pain.  PT asked RN about getting pain medicine, however RN states that if she takes something now (had already gotten something at 8am) that she would not get any medicine until 7pm.  Pt agreeable to wait to space medicine out a little more.  Pt self propelled to therapy gym using BUEs x 100' at S level.  Cues throughout for increased propulsion with UEs to decrease energy consumption.  Also max verbal cues for making turns and setting up w/c for transfer.  Assisted with removal of leg rests.  Performed stand pivot transfer with rollator to mat at min A level with cues for hand placement and safety.  Once on mat, assisted to/from supine position with min to mod A.  Pt with increased pain when sitting back up and requires max verbal cues for pursed lip breathing and to use UEs to assist instead of reaching out for assist.  While in supine performed active assisted SLR x 10 reps RLE, quad sets x 10 reps RLE, and active assisted knee flex (on maxi slide for decreased friction) x 10 reps RLE.  Remainder of session focused on gait training, first with use of rollator locked x 20' at min A (+2 for chair follow).  Note that pt  unable to take longer strides due to seat blocking LEs and also not able to increase UE use to decrease pain in LE, therefore provided pt with youth RW for next two bouts of gait x 30' x 2 reps.  Note marked improvement in L step length and stride length overall.  Pt assisted back to room and stating she needed to use restroom.  Assisted via stand pivot using grab bars to/from toilet.  Also assist for adjusting clothing following toileting.  Pt left in w/c in room on 6LO2.  Nurse tech notified of pts request to get back into bed.    Pt remained in upper 90's to 100% on 6LO2 throughout session.  Continued jaw pain from pain pump.    Therapy Documentation Precautions:  Precautions Precautions: Anterior Hip Precaution Comments: 6L O2 (used this at home); and medication pump Restrictions Weight Bearing Restrictions: Yes RLE Weight Bearing: Weight bearing as tolerated   Vital Signs: Therapy Vitals Pulse Rate: 106 BP: 135/56 mmHg Patient Position (if appropriate): Sitting Pain: Pain Assessment Pain Assessment: 0-10 Pain Score: 9  Pain Type: Acute pain Pain Location: Jaw Pain Orientation: Lower Pain Descriptors / Indicators: Aching Pain Frequency: Intermittent Pain Onset: Gradual Patients Stated Pain Goal: 3 Pain Intervention(s): Medication (See eMAR)   Locomotion : Ambulation Ambulation/Gait Assistance: 4: Min assist;1: +2 Total assist Wheelchair  Mobility Distance: 100   See FIM for current functional status  Therapy/Group: Individual Therapy  Denice Bors 08/09/2014, 12:29 PM

## 2014-08-10 ENCOUNTER — Inpatient Hospital Stay (HOSPITAL_COMMUNITY): Payer: Medicare Other

## 2014-08-10 ENCOUNTER — Inpatient Hospital Stay (HOSPITAL_COMMUNITY): Payer: Medicare Other | Admitting: Occupational Therapy

## 2014-08-10 ENCOUNTER — Inpatient Hospital Stay (HOSPITAL_COMMUNITY): Payer: Medicare Other | Admitting: Rehabilitation

## 2014-08-10 ENCOUNTER — Encounter (HOSPITAL_COMMUNITY): Payer: Medicare Other | Admitting: Occupational Therapy

## 2014-08-10 LAB — GLUCOSE, CAPILLARY
GLUCOSE-CAPILLARY: 136 mg/dL — AB (ref 70–99)
Glucose-Capillary: 165 mg/dL — ABNORMAL HIGH (ref 70–99)
Glucose-Capillary: 110 mg/dL — ABNORMAL HIGH (ref 70–99)
Glucose-Capillary: 139 mg/dL — ABNORMAL HIGH (ref 70–99)

## 2014-08-10 LAB — CREATININE, SERUM
CREATININE: 0.84 mg/dL (ref 0.50–1.10)
GFR calc non Af Amer: 69 mL/min — ABNORMAL LOW (ref >90)
GFR, EST AFRICAN AMERICAN: 80 mL/min — AB (ref >90)

## 2014-08-10 MED ORDER — HYDROMORPHONE HCL 2 MG PO TABS
4.0000 mg | ORAL_TABLET | Freq: Four times a day (QID) | ORAL | Status: DC | PRN
  Administered 2014-08-10 – 2014-08-17 (×17): 4 mg via ORAL
  Filled 2014-08-10 (×18): qty 2

## 2014-08-10 NOTE — Progress Notes (Signed)
Notes reviewed and accurately reflect treatment sessions.

## 2014-08-10 NOTE — Progress Notes (Signed)
Physical Therapy Session Note  Patient Details  Name: Meagan [name] MRN: [id number] Date of Birth: 1946-06-11  Today's Date: 08/10/2014 PT Individual Time: 1615-1700 PT Individual Time Calculation (min): 45 min   Short Term Goals: Week 1:  PT Short Term Goal 1 (Week 1): Pt will demonstrate supine to sit transfer req mod A.  PT Short Term Goal 2 (Week 1): Pt will demonstrate sit to supine req SBA.  PT Short Term Goal 3 (Week 1): Pt will transfer w/c to/from bed req min A.  PT Short Term Goal 4 (Week 1): Pt will ambulate 15' with walker req min A.  PT Short Term Goal 5 (Week 1): Pt will self propel manual w/c x 50' req mod A.   Skilled Therapeutic Interventions/Progress Updates:  1:1. Pt received supine in bed, ready for therapy. Focus this session on functional endurance during w/c propulsion, safety during transfers and stair negotiation. Pt req min A for t/f sup>sit EOB without use of bed rails as well as all t/f sit<>stand and SPT bed>with with consistent min cues for seq and hand placement. Pt propelled w/c 125'x2 with B UE and min A, intermittent cues for improved propulsion technique.   Pt attempted negotiation up/down single 6" step in forwards direction with use of B rail or B UE on single rail, however, pt unable to safely complete due to fatigue and pain in R LE. Pt req mod A to negotiate up/down single 6" step x2 in reverse direction with use of RW and mod cues for seq and safety. Pt req seated rest between all reps.   Pt left sitting in w/c at end of session w/ all needs in reach and husband in room.   Therapy Documentation Precautions:  Precautions Precautions: Anterior Hip Precaution Booklet Issued: No Precaution Comments: 6L O2 (used this at home); and medication pump Restrictions Weight Bearing Restrictions: Yes RLE Weight Bearing: Weight bearing as tolerated  See FIM for current functional status  Therapy/Group: Individual Therapy  Gilmore Laroche 08/10/2014,  5:25 PM

## 2014-08-10 NOTE — Progress Notes (Signed)
Occupational Therapy Session Notes  Patient Details  Name: Meagan [name] MRN: [id number] Date of Birth: 02-Feb-1946  Today's Date: 08/10/2014   Short Term Goals: Week 1:  OT Short Term Goal 1 (Week 1): Pt will complete UB/LB dressing using AE prn w/ min A. OT Short Term Goal 2 (Week 1): Pt will complete UB/LB bathing using AE prn w/ min A. OT Short Term Goal 3 (Week 1): Pt will complete toilet t/f onto 3-in-1 commode w/ min A. OT Short Term Goal 4 (Week 1): Pt will be educated on UE strengthening and ROM HEP.  Skilled Therapeutic Interventions/Progress Updates:   Session 1: OT Individual Time: 7564-3329 OT Individual Time Calculation (min): 45 min  Skilled OT intervention focusing on ADL retraining, AE education, dynamic standing balance, activity tolerance/endurance, safety awareness, and functional t/f. Pt supine in bed w/ husband in room when arriving. Pt reporting 5/10 pain and had received morning pain medication. Pt t/f to EOB and then to w/c w/ stand pivot. Pt verbalized 2/3 anterior hip precautions, educated on remaining hip precaution. Pt self-propelled w/c to sink and completed UB/LB B&D using reacher, sock-aid, and w/ 2 sit<>stand t/f. Pt completed tooth brushing while standing at sink for 1 min 45 sec. Pt brushed hair while seated at sink. Pt self-propelled w/c to side of bed and therapist attached elevated leg rest for support of R hip, as pt was complaining of hip pain. Pt reporting pain subsiding after placed on leg rest. Pt seated in w/c w/ all needs nearby when leaving.  Session 2: OT Individual Time: 1300-1330 OT Individual Time Calculation (min): 30 min  Skilled OT intervention focusing on ADL retraining, dynamic standing balance, activity tolerance, safety awareness, and functional t/f. Pt supine in bed w/ husband in room when arriving. Pt reporting 7/10 pain in R hip, RN had just administered medication. Pt requesting to go to the bathroom. Pt t/f to EOB and then to w/c  w/ stand pivot. Pt self-propelled to bathroom and t/f to toilet using grab bars. Pt voided, completed hygiene, and t/f to w/c w stand pivot. Pt self-propelled w/c to sink and completed hand washing while standing at sink. Pt seated in w/c w/ RLE elevated on leg rest and all needs nearby when leaving. Pt w/ increased standing balance, decreased A needed for sit<>stand, and increased standing endurance than in previous session.  Therapy Documentation Precautions:  Precautions Precautions: Anterior Hip Precaution Booklet Issued: No Precaution Comments: 6L O2 (used this at home); and medication pump Restrictions Weight Bearing Restrictions: Yes RLE Weight Bearing: Weight bearing as tolerated  See FIM for current functional status  Therapy/Group: Individual Therapy  Meagan [name] 08/10/2014, 12:29 PM

## 2014-08-10 NOTE — Progress Notes (Signed)
68 y.o. right-handed female with history of systolic congestive heart failure, severe PAH(pulmonary arterial hypertension) maintained on continuous flofan infusion, fibromyalgia, COPD with chronic oxygen of 6 L, CAD with CABG and diabetes mellitus with peripheral neuropathy. Independent prior to admission living with her husband and used a walker outside of the home. Presented 07/27/2014 after mechanical fall. Denies loss of consciousness or associated chest pain or shortness or breath associated with fall. X-rays and imaging revealed a right hip displaced and angulated femoral neck fracture. Cardiology followup preoperatively and cleared for surgery. Underwent right total hip arthroplasty 07/29/2014 per Dr. Ninfa Linden. Weightbearing as tolerated with anterior hip precautions. Hospital course pain management. Subcutaneous Lovenox for DVT prophylaxis. Acute blood loss anemia and transfuse   Subjective/Complaints: Had a much better day with dilaudid although dilaudid only lasting 4 hours  Review of Systems - Respiratory ROS: no cough, shortness of breath, or wheezing positive for - "non specific breathing problem". No oversedation  Objective: Vital Signs: Blood pressure 111/59, pulse 98, temperature 98.8 F (37.1 C), temperature source Oral, resp. rate 18, weight 66.5 kg (146 lb 9.7 oz), SpO2 100.00%. No results found. Results for orders placed during the hospital encounter of 08/03/14 (from the past 72 hour(s))  GLUCOSE, CAPILLARY     Status: Abnormal   Collection Time    08/07/14 12:01 PM      Result Value Ref Range   Glucose-Capillary 156 (*) 70 - 99 mg/dL  BASIC METABOLIC PANEL     Status: Abnormal   Collection Time    08/07/14  1:00 PM      Result Value Ref Range   Sodium 131 (*) 137 - 147 mEq/L   Potassium 4.9  3.7 - 5.3 mEq/L   Chloride 92 (*) 96 - 112 mEq/L   CO2 29  19 - 32 mEq/L   Glucose, Bld 179 (*) 70 - 99 mg/dL   BUN 15  6 - 23 mg/dL   Creatinine, Ser 0.97  0.50 - 1.10 mg/dL    Calcium 8.9  8.4 - 10.5 mg/dL   GFR calc non Af Amer 58 (*) >90 mL/min   GFR calc Af Amer 68 (*) >90 mL/min   Comment: (NOTE)     The eGFR has been calculated using the CKD EPI equation.     This calculation has not been validated in all clinical situations.     eGFR's persistently <90 mL/min signify possible Chronic Kidney     Disease.   Anion gap 10  5 - 15  GLUCOSE, CAPILLARY     Status: Abnormal   Collection Time    08/07/14  5:03 PM      Result Value Ref Range   Glucose-Capillary 153 (*) 70 - 99 mg/dL  GLUCOSE, CAPILLARY     Status: Abnormal   Collection Time    08/07/14  9:24 PM      Result Value Ref Range   Glucose-Capillary 156 (*) 70 - 99 mg/dL  GLUCOSE, CAPILLARY     Status: Abnormal   Collection Time    08/08/14  6:57 AM      Result Value Ref Range   Glucose-Capillary 135 (*) 70 - 99 mg/dL   Comment 1 Notify RN    GLUCOSE, CAPILLARY     Status: Abnormal   Collection Time    08/08/14 12:07 PM      Result Value Ref Range   Glucose-Capillary 169 (*) 70 - 99 mg/dL  GLUCOSE, CAPILLARY     Status: Abnormal  Collection Time    08/08/14  4:33 PM      Result Value Ref Range   Glucose-Capillary 168 (*) 70 - 99 mg/dL  GLUCOSE, CAPILLARY     Status: Abnormal   Collection Time    08/08/14  9:52 PM      Result Value Ref Range   Glucose-Capillary 173 (*) 70 - 99 mg/dL  GLUCOSE, CAPILLARY     Status: Abnormal   Collection Time    08/09/14  7:13 AM      Result Value Ref Range   Glucose-Capillary 108 (*) 70 - 99 mg/dL   Comment 1 Notify RN    GLUCOSE, CAPILLARY     Status: Abnormal   Collection Time    08/09/14 11:15 AM      Result Value Ref Range   Glucose-Capillary 180 (*) 70 - 99 mg/dL   Comment 1 Notify RN    GLUCOSE, CAPILLARY     Status: Abnormal   Collection Time    08/09/14  4:41 PM      Result Value Ref Range   Glucose-Capillary 163 (*) 70 - 99 mg/dL   Comment 1 Notify RN    GLUCOSE, CAPILLARY     Status: Abnormal   Collection Time    08/09/14  9:04 PM       Result Value Ref Range   Glucose-Capillary 172 (*) 70 - 99 mg/dL  GLUCOSE, CAPILLARY     Status: Abnormal   Collection Time    08/10/14  7:27 AM      Result Value Ref Range   Glucose-Capillary 165 (*) 70 - 99 mg/dL   Comment 1 Notify RN        Constitutional: She is oriented to person, place, and time. She appears well-developed.  HENT: oral mucosa moist, dentition fair  Head: Normocephalic.  Eyes: EOM are normal.  Neck: Normal range of motion. Neck supple. No thyromegaly present.  Cardiovascular: Normal rate and regular rhythm.  Respiratory: Breath sounds normal. No respiratory distress.  GI: Soft. Bowel sounds are normal. She exhibits no distension.  Neurological: She is alert and oriented to person, place, and time. Fair insight and awareness.  Follows three-step commands. HOH sometimes limiting. UES: 4+ to 5/5. XQJ:JHERDEY due to pain, 1/5 hf, 1+ KE and 4- ADF/APF. LLE grossly 3+ to 4+/5 prox to distal. No gross sensory findings.  Skin: incision clean, some dry blood/clot proximally along incision  M/S: right hip with mild edema. Area remains very sensitive to touch.  Psychiatric:   Anxiety better   Assessment/Plan: 1. Functional deficits secondary to Right Femoral neck fracture which require 3+ hours per day of interdisciplinary therapy in a comprehensive inpatient rehab setting. Physiatrist is providing close team supervision and 24 hour management of active medical problems listed below. Physiatrist and rehab team continue to assess barriers to discharge/monitor patient progress toward functional and medical goals. FIM: FIM - Bathing Bathing Steps Patient Completed: Chest;Left upper leg;Right Arm;Left Arm;Abdomen;Front perineal area;Right upper leg;Left lower leg (including foot);Buttocks Bathing: 4: Min-Patient completes 8-9 60f10 parts or 75+ percent  FIM - Upper Body Dressing/Undressing Upper body dressing/undressing steps patient completed: Thread/unthread left  sleeve of pullover shirt/dress;Thread/unthread right sleeve of pullover shirt/dresss;Put head through opening of pull over shirt/dress;Pull shirt over trunk Upper body dressing/undressing: 5: Set-up assist to: Obtain clothing/put away FIM - Lower Body Dressing/Undressing Lower body dressing/undressing steps patient completed: Thread/unthread right underwear leg;Thread/unthread left underwear leg;Pull underwear up/down;Thread/unthread left pants leg;Don/Doff left sock;Don/Doff right sock;Pull pants up/down  Lower body dressing/undressing: 4: Min-Patient completed 75 plus % of tasks  FIM - Toileting Toileting steps completed by patient: Adjust clothing prior to toileting;Adjust clothing after toileting Toileting Assistive Devices: Grab bar or rail for support Toileting: 4: Steadying assist  FIM - Radio producer Devices: Grab bars Toilet Transfers: 4-To toilet/BSC: Min A (steadying Pt. > 75%);4-From toilet/BSC: Min A (steadying Pt. > 75%)  FIM - Control and instrumentation engineer Devices: Walker;Arm rests Bed/Chair Transfer: 4: Supine > Sit: Min A (steadying Pt. > 75%/lift 1 leg);4: Bed > Chair or W/C: Min A (steadying Pt. > 75%)  FIM - Locomotion: Wheelchair Distance: 100 Locomotion: Wheelchair: 2: Travels 64 - 149 ft with supervision, cueing or coaxing FIM - Locomotion: Ambulation Locomotion: Ambulation Assistive Devices:  (4WW and RW) Ambulation/Gait Assistance: 4: Min assist;1: +2 Total assist Locomotion: Ambulation: 1: Two helpers (for chair follow and oxygen management)  Comprehension Comprehension Mode: Auditory Comprehension: 5-Follows basic conversation/direction: With extra time/assistive device  Expression Expression Mode: Verbal Expression: 6-Expresses complex ideas: With extra time/assistive device  Social Interaction Social Interaction: 6-Interacts appropriately with others with medication or extra time (anti-anxiety,  antidepressant).  Problem Solving Problem Solving: 5-Solves basic 90% of the time/requires cueing < 10% of the time  Memory Memory: 6-More than reasonable amt of time  Medical Problem List and Plan:  1. Functional deficits secondary to displaced right femoral neck fracture. 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 Weightbearing as tolerated with anterior hip precautions  2. DVT Prophylaxis/Anticoagulation: Subcutaneous Lovenox. Monitor platelet counts and any signs of bleeding. Check vascular study  3. Pain Management: add scheduled and prn dilaudid. Increased prn dosing to q6 prn  -Cymbalta for Fibromyalgia  4. Mood/anxiety/depression: Xanax 1 mg each bedtime. Provide emotional support  5. Neuropsych: This patient is capable of making decisions on her own behalf.  6. Skin/Wound Care: Routine skin checks  7. Fluids/Electrolytes/Nutrition: Followup chemistries was strict I and Os. Provide nutritional supplements as needed  8. Severe pulmonary arterial hypertension. Continue Flolan per cardiology services as well as reduce  Toprol-XL 50 mg daily,HR in usual range thus far  restart Imdur 90 mg daily,Cont  Lasix 80 mg daily.   -appreciated cards follow up 9. Diabetes mellitus with peripheral neuropathy. Latest hemoglobin A1c 6.3. Lantus insulin 8 units each bedtime.  -fair control remains 10. Hypothyroidism. Continue Synthroid  11. Hyperlipidemia.Vytorin  12. Recurrent Clostridium difficile. Contact precautions. Vancomycin 125 mg 4 times a day x14 days initiated 08/01/2014  13. COPD/chronic respiratory failure. Oxygen therapy as directed  14.  UTI yeast added diflucan through 10/23  LOS (Days) 7 A FACE TO FACE EVALUATION WAS PERFORMED  Rockland Kotarski T 08/10/2014, 8:17 AM

## 2014-08-10 NOTE — Progress Notes (Signed)
Physical Therapy Session Note  Patient Details  Name: Meagan [name] MRN: [id number] Date of Birth: [birth year]  Today's Date: 08/10/2014 PT Individual Time: 1406-1506 PT Individual Time Calculation (min): 60 min   Short Term Goals: Week 1:  PT Short Term Goal 1 (Week 1): Pt will demonstrate supine to sit transfer req mod A.  PT Short Term Goal 2 (Week 1): Pt will demonstrate sit to supine req SBA.  PT Short Term Goal 3 (Week 1): Pt will transfer w/c to/from bed req min A.  PT Short Term Goal 4 (Week 1): Pt will ambulate 15' with walker req min A.  PT Short Term Goal 5 (Week 1): Pt will self propel manual w/c x 50' req mod A.   Skilled Therapeutic Interventions/Progress Updates:   Pt received sitting in w/c in room, agreeable to therapy session.  Discussed goal of session was gait and stair negotiation.  Discussed that PT would like to practice single step with RW, as this may be how she has to perform at home if husband can not get handrails installed.  Pt and husband verbalized understanding.  Assisted pt to therapy gym via w/c at total A level.  Performed 26' gait x 1 rep with RW at min A level with w/c follow for safety.  PT demonstrated how to perform single step with RW.  Pt able to return demonstration up and down without seated rest break and turn and sit to w/c.  Mod verbal cues for sequencing and technique as well as for continued breathing due to anxiety.  Pt then discussing how she is frustrated with having to wait to use restroom during the day.  Offered to perform hands on training with husband to get him signed off for toileting during the day.  Pt and husband agreeable therefore assisted back to room.  Pt and husband performed stand pivot transfer w/c<>bedside commode using RW.  Max verbal cues to manage oxygen and medication pump.  Also cues for where to assist rather than under arm in case of LOB.  Performed again without RW as pt states that sometimes she has to go urgently.  Due  to safety concerns, do not recommend this unless in restroom due to grab bar.  Performed one last time in restroom with grab bar.  Again safety concerns with oxygen and medication pump management and hand placement/handling safety.  Feel they need continued practice before being signed off to perform in room.  Husband verbalized understanding.  Pt transferred back to bed and left in bed with all needs in reach.    Therapy Documentation Precautions:  Precautions Precautions: Anterior Hip Precaution Booklet Issued: No Precaution Comments: 6L O2 (used this at home); and medication pump Restrictions Weight Bearing Restrictions: Yes RLE Weight Bearing: Weight bearing as tolerated   Pain: Pain Assessment Pain Assessment: 0-10 Pain Score: 4    Locomotion : Ambulation Ambulation/Gait Assistance: 4: Min assist;1: +2 Total assist   See FIM for current functional status  Therapy/Group: Individual Therapy  Denice Bors 08/10/2014, 4:34 PM

## 2014-08-11 ENCOUNTER — Inpatient Hospital Stay (HOSPITAL_COMMUNITY): Payer: Medicare Other

## 2014-08-11 ENCOUNTER — Inpatient Hospital Stay (HOSPITAL_COMMUNITY): Payer: Medicare Other | Admitting: Occupational Therapy

## 2014-08-11 ENCOUNTER — Encounter (HOSPITAL_COMMUNITY): Payer: Medicare Other | Admitting: Occupational Therapy

## 2014-08-11 DIAGNOSIS — I5033 Acute on chronic diastolic (congestive) heart failure: Secondary | ICD-10-CM

## 2014-08-11 LAB — GLUCOSE, CAPILLARY
GLUCOSE-CAPILLARY: 128 mg/dL — AB (ref 70–99)
Glucose-Capillary: 154 mg/dL — ABNORMAL HIGH (ref 70–99)
Glucose-Capillary: 161 mg/dL — ABNORMAL HIGH (ref 70–99)
Glucose-Capillary: 160 mg/dL — ABNORMAL HIGH (ref 70–99)

## 2014-08-11 NOTE — Patient Care Conference (Signed)
Inpatient RehabilitationTeam Conference and Plan of Care Update Date: 08/10/2014   Time: 2:25 PM    Patient Name: Meagan [name]      Medical Record Number: [id number]  Date of Birth: [birth year] Sex: Female         Room/Bed: 4W12C/4W12C-01 Payor Info: Payor: MEDICARE / Plan: MEDICARE PART A AND B / Product Type: *No Product type* /    Admitting Diagnosis: R hip fx  Admit Date/Time:  08/03/2014  5:44 PM Admission Comments: No comment available   Primary Diagnosis:  <principal problem not specified> Principal Problem: <principal problem not specified>  Patient Active Problem List   Diagnosis Date Noted  . NSVT (nonsustained ventricular tachycardia) 08/03/2014  . Hip fracture 08/03/2014  . Right renal mass 07/29/2014  . Anemia 07/29/2014  . Pre-operative cardiovascular examination 07/28/2014  . Closed right hip fracture 07/27/2014  . CKD (chronic kidney disease), stage III 07/27/2014  . C. difficile diarrhea 06/08/2014  . Reflux esophagitis 06/06/2014  . Upper GI bleed 06/03/2014  . Pancreatic mass 08/01/2012  . Hepatomegaly 05/07/2012  . CAD-native artery   . GERD 06/20/2010  . DM (diabetes mellitus), type 2, uncontrolled 05/10/2010  . Acute on chronic diastolic heart failure 89/38/1017  . PAH (pulmonary artery hypertension) 07/25/2009  . Chronic diastolic heart failure     Expected Discharge Date: Expected Discharge Date: 08/20/14  Team Members Present: Physician leading conference: Dr. Alger Simons Social Worker Present: Lennart Pall, LCSW Nurse Present: Elliot Cousin, RN PT Present: Raylene Everts, PT OT Present: Chrys Racer, OT SLP Present: Weston Anna, SLP Other (Discipline and Name): Danne Baxter, RN [facility]) PPS Coordinator present : Daiva Nakayama, RN, CRRN     Current Status/Progress Goal Weekly Team Focus  Medical   right intertroch hip fx, chronic pain, PF  improve activity tolerance  improve pain control, energy conservation   Bowel/Bladder   Continent  of bowel and bladder  Min assist  Remain continent of bowel and bladder   Swallow/Nutrition/ Hydration             ADL's   Pt is currently supervision level for UB selfcare and min assist for LB selfcare, min assist for toilet transfers and sit to stand with LB selfcare tasks.  Increased anxiety with most tasks secondary to pain in the right hip.  Decreased ability to remember and carryover techniques from session to session.  supervision overall  selfcare re-training, balance re-training, pt/family education,DME/AE education, functional mobility training, safety   Mobility   min to mod A overall; very limited tolerance  S/min A overall  endurance/activity tolerance, increasing functional mobility, gait training, stair negotiation, strengthening   Communication             Safety/Cognition/ Behavioral Observations            Pain   4mg  Dilaudid q 12hr, 4mg  Dilaudid PRN q 8hr.  <4 on a 0-10 scale  Assess pain q 4hr and reassess after each PRN medication intervention   Skin   Incision with staples to R hip  Remain free from infection/breakdown while on rehab  Assess skin q shift     Rehab Goals Patient on target to meet rehab goals: Yes *See Care Plan and progress notes for long and short-term goals.  Barriers to Discharge: respiriatory limitations, pain    Possible Resolutions to Barriers:  pain control, pacing/restbreaks, education/supervision by husband    Discharge Planning/Teaching Needs:  home with husband to provide 24/7 assistance  Team Discussion:  Pain appears to be the most inhibiting factor, however, maybe a little decreased today?  Very dependent personality and allows spouse to domineer.  Poor endurance overall.  Just starting to beginning to use walker. Recommend changing to standard rw.  Address energy conservation.  Revisions to Treatment Plan:  None   Continued Need for Acute Rehabilitation Level of Care: The patient requires daily medical management by a  physician with specialized training in physical medicine and rehabilitation for the following conditions: Daily direction of a multidisciplinary physical rehabilitation program to ensure safe treatment while eliciting the highest outcome that is of practical value to the patient.: Yes Daily medical management of patient stability for increased activity during participation in an intensive rehabilitation regime.: Yes Daily analysis of laboratory values and/or radiology reports with any subsequent need for medication adjustment of medical intervention for : Post surgical problems;Pulmonary problems;Neurological problems;Other  Rohil Lesch, [place] 08/11/2014, 12:00 PM

## 2014-08-11 NOTE — Progress Notes (Signed)
Occupational Therapy Weekly Progress Note & Session Notes  Patient Details  Name: Meagan [name] MRN: [id number] Date of Birth: 12-Nov-1945  Beginning of progress report period: August 04, 2014 End of progress report period: August 12, 2014  Today's Date: 08/11/2014  Patient has met 4 of 4 short term goals. Pt limited by pain in R hip, anxiety, short term memory, and dependence on husband for support. Beginning to work on completing ADLs while standing and ambulating w/ RW. Pt becomes fatigued easily and often looks for encouragement and reassurance from husband. Pt and husband asking for earlier d/c than planned date of 10/30, will discuss need for increased endurance and safety awareness w/ ambulation for earlier d/c date. Goals may need to be d/c to w/c level if pt does not increase endurance and safety when using RW for completing ADLs before 10/27, the date that pt and husband want pt to be d/c.    Patient continues to demonstrate the following deficits: Decreased activity tolerance/endurance, decreased safety awareness, decreased dynamic standing balance, decreased memory of and adherence to anterior hip precautions and therefore will continue to benefit from skilled OT intervention to enhance overall performance with BADL.  Patient progressing toward long term goals..  Continue plan of care.  OT Short Term Goals Week 1:  OT Short Term Goal 1 (Week 1): Pt will complete UB/LB dressing using AE prn w/ min A. OT Short Term Goal 1 - Progress (Week 1): Met OT Short Term Goal 2 (Week 1): Pt will complete UB/LB bathing using AE prn w/ min A. OT Short Term Goal 2 - Progress (Week 1): Met OT Short Term Goal 3 (Week 1): Pt will complete toilet t/f onto 3-in-1 commode w/ min A. OT Short Term Goal 3 - Progress (Week 1): Met OT Short Term Goal 4 (Week 1): Pt will be educated on UE strengthening and ROM HEP. OT Short Term Goal 4 - Progress (Week 1): Met Week 2:  OT Short Term Goal 1 (Week 2):  STG=LTG due to length of stay  Skilled Therapeutic Interventions/Progress Updates:   Session 1: OT Individual Time: 1884-1660 OT Individual Calculated Time (min): 60 min Skilled OT session focusing on ADL retraining, endurance/activity tolerance, dynamic standing balance, functional ambulation and t/f, and AE training. Pt supine in bed w/ sister in room when arriving, reporting 7/10 pain and had just received medication. Pt t/f to EOB, performed sit<>stand, and ambulated to sink w/ RW. Pt sat in w/c at sink and performed UB/LB B&D, standing to bath perineal area and pull up underwear and pants 2x. Pt used reacher and sock aid for LB dressing. Pt performed tooth brushing and hair brushing while standing at sink, for a total of 3 min 5 sec (a 1 min 20 sec increase from previous B&D session). Pt requiring min v/c for following hip precautions during LB dressing. Pt seated in w/c, w/ RLE elevated on leg rest and set up for breakfast w/ all needs nearby when leaving.  Session 2: OT Individual Time: 1400-1500  OT Individual Calculated Time (min): 60 min   Skilled OT session focusing on ADL retraining, dynamic standing balance, functional ambulation, functional t/f, endurance/activity tolerance, and pt and family education.Pt seated in w/c when arriving w/ sister and husband in room, reporting 6/10 pain in R hip and she had recently received pain medication. Pt requesting to go to bathroom and ambulated to bathroom and t/f to toilet w/ RW. Pt performed toilet hygiene and ambulated to w/c in room.  Pt performed hand washing while seated at sink and then self-propelled w/c to rehab apartment. Pt and husband educated on walk-in shower t/f and then pt practiced t/f into simulated walk-in shower using RW w/ therapist and then w/ husband. Pt educated on 4 UE strength and ROM exercises and then completed 10 repetitions of each exercise. Pt self-propelled w/c to room and seated in room w/ all needs nearby when  leaving.  Therapy Documentation Precautions:  Precautions Precautions: Anterior Hip Precaution Booklet Issued: No Precaution Comments: 6L O2 (used this at home); and medication pump Restrictions Weight Bearing Restrictions: Yes RUE Weight Bearing: Weight bearing as tolerated LUE Weight Bearing: Weight bearing as tolerated RLE Weight Bearing: Weight bearing as tolerated LLE Weight Bearing: Weight bearing as tolerated  See FIM for current functional status  Therapy/Group: Individual Therapy  Talyia Allende Raynell 08/11/2014, 11:43 AM

## 2014-08-11 NOTE — Progress Notes (Signed)
68 y.o. right-handed female with history of systolic congestive heart failure, severe PAH(pulmonary arterial hypertension) maintained on continuous flofan infusion, fibromyalgia, COPD with chronic oxygen of 6 L, CAD with CABG and diabetes mellitus with peripheral neuropathy. Independent prior to admission living with her husband and used a walker outside of the home. Presented 07/27/2014 after mechanical fall. Denies loss of consciousness or associated chest pain or shortness or breath associated with fall. X-rays and imaging revealed a right hip displaced and angulated femoral neck fracture. Cardiology followup preoperatively and cleared for surgery. Underwent right total hip arthroplasty 07/29/2014 per Dr. Ninfa Linden. Weightbearing as tolerated with anterior hip precautions. Hospital course pain management. Subcutaneous Lovenox for DVT prophylaxis. Acute blood loss anemia and transfuse   Subjective/Complaints: Crying in pain.  Need more medication!  Review of Systems - Respiratory ROS: no cough, shortness of breath, or wheezing positive for - "non specific breathing problem". No oversedation  Objective: Vital Signs: Blood pressure 107/53, pulse 91, temperature 98.1 F (36.7 C), temperature source Oral, resp. rate 20, weight 66.5 kg (146 lb 9.7 oz), SpO2 100.00%. No results found. Results for orders placed during the hospital encounter of 08/03/14 (from the past 72 hour(s))  GLUCOSE, CAPILLARY     Status: Abnormal   Collection Time    08/08/14 12:07 PM      Result Value Ref Range   Glucose-Capillary 169 (*) 70 - 99 mg/dL  GLUCOSE, CAPILLARY     Status: Abnormal   Collection Time    08/08/14  4:33 PM      Result Value Ref Range   Glucose-Capillary 168 (*) 70 - 99 mg/dL  GLUCOSE, CAPILLARY     Status: Abnormal   Collection Time    08/08/14  9:52 PM      Result Value Ref Range   Glucose-Capillary 173 (*) 70 - 99 mg/dL  GLUCOSE, CAPILLARY     Status: Abnormal   Collection Time    08/09/14   7:13 AM      Result Value Ref Range   Glucose-Capillary 108 (*) 70 - 99 mg/dL   Comment 1 Notify RN    GLUCOSE, CAPILLARY     Status: Abnormal   Collection Time    08/09/14 11:15 AM      Result Value Ref Range   Glucose-Capillary 180 (*) 70 - 99 mg/dL   Comment 1 Notify RN    GLUCOSE, CAPILLARY     Status: Abnormal   Collection Time    08/09/14  4:41 PM      Result Value Ref Range   Glucose-Capillary 163 (*) 70 - 99 mg/dL   Comment 1 Notify RN    GLUCOSE, CAPILLARY     Status: Abnormal   Collection Time    08/09/14  9:04 PM      Result Value Ref Range   Glucose-Capillary 172 (*) 70 - 99 mg/dL  GLUCOSE, CAPILLARY     Status: Abnormal   Collection Time    08/10/14  7:27 AM      Result Value Ref Range   Glucose-Capillary 165 (*) 70 - 99 mg/dL   Comment 1 Notify RN    CREATININE, SERUM     Status: Abnormal   Collection Time    08/10/14  8:15 AM      Result Value Ref Range   Creatinine, Ser 0.84  0.50 - 1.10 mg/dL   GFR calc non Af Amer 69 (*) >90 mL/min   GFR calc Af Amer 80 (*) >90 mL/min  Comment: (NOTE)     The eGFR has been calculated using the CKD EPI equation.     This calculation has not been validated in all clinical situations.     eGFR's persistently <90 mL/min signify possible Chronic Kidney     Disease.  GLUCOSE, CAPILLARY     Status: Abnormal   Collection Time    08/10/14 11:40 AM      Result Value Ref Range   Glucose-Capillary 110 (*) 70 - 99 mg/dL   Comment 1 Notify RN    GLUCOSE, CAPILLARY     Status: Abnormal   Collection Time    08/10/14  5:05 PM      Result Value Ref Range   Glucose-Capillary 139 (*) 70 - 99 mg/dL  GLUCOSE, CAPILLARY     Status: Abnormal   Collection Time    08/10/14  9:08 PM      Result Value Ref Range   Glucose-Capillary 136 (*) 70 - 99 mg/dL  GLUCOSE, CAPILLARY     Status: Abnormal   Collection Time    08/11/14  6:47 AM      Result Value Ref Range   Glucose-Capillary 154 (*) 70 - 99 mg/dL   Comment 1 Notify RN         Constitutional: She is oriented to person, place, and time. She appears well-developed.  HENT: oral mucosa moist, dentition fair  Head: Normocephalic.  Eyes: EOM are normal.  Neck: Normal range of motion. Neck supple. No thyromegaly present.  Cardiovascular: Normal rate and regular rhythm.  Respiratory: Breath sounds normal. No respiratory distress.  GI: Soft. Bowel sounds are normal. She exhibits no distension.  Neurological: She is alert and oriented to person, place, and time. Fair insight and awareness.  Follows three-step commands. HOH sometimes limiting. UES: 4+ to 5/5. LKG:MWNUUVO due to pain, 1/5 hf, 1+ KE and 4- ADF/APF. LLE grossly 3+ to 4+/5 prox to distal. No gross sensory findings.  Skin: incision clean, some dry blood/clot proximally along incision  M/S: right hip with mild edema. Area remains very sensitive to touch.  Psychiatric:   Anxiety better   Assessment/Plan: 1. Functional deficits secondary to Right Femoral neck fracture which require 3+ hours per day of interdisciplinary therapy in a comprehensive inpatient rehab setting. Physiatrist is providing close team supervision and 24 hour management of active medical problems listed below. Physiatrist and rehab team continue to assess barriers to discharge/monitor patient progress toward functional and medical goals. FIM: FIM - Bathing Bathing Steps Patient Completed: Chest;Left upper leg;Right Arm;Left Arm;Abdomen;Front perineal area;Right upper leg;Buttocks Bathing: 4: Min-Patient completes 8-9 110f10 parts or 75+ percent  FIM - Upper Body Dressing/Undressing Upper body dressing/undressing steps patient completed: Thread/unthread left sleeve of pullover shirt/dress;Thread/unthread right sleeve of pullover shirt/dresss;Put head through opening of pull over shirt/dress;Pull shirt over trunk Upper body dressing/undressing: 5: Set-up assist to: Obtain clothing/put away FIM - Lower Body Dressing/Undressing Lower body  dressing/undressing steps patient completed: Thread/unthread right underwear leg;Thread/unthread left underwear leg;Pull underwear up/down;Thread/unthread left pants leg;Don/Doff left sock;Don/Doff right sock;Pull pants up/down;Thread/unthread right pants leg Lower body dressing/undressing: 4: Min-Patient completed 75 plus % of tasks  FIM - Toileting Toileting steps completed by patient: Adjust clothing prior to toileting;Performs perineal hygiene;Adjust clothing after toileting Toileting Assistive Devices: Grab bar or rail for support Toileting: 4: Steadying assist  FIM - TRadio producerDevices: Grab bars Toilet Transfers: 4-To toilet/BSC: Min A (steadying Pt. > 75%);4-From toilet/BSC: Min A (steadying Pt. > 75%)  FIM -  Bed/Chair Financial planner Devices: Environmental consultant;Arm rests Bed/Chair Transfer: 4: Supine > Sit: Min A (steadying Pt. > 75%/lift 1 leg);4: Bed > Chair or W/C: Min A (steadying Pt. > 75%);4: Chair or W/C > Bed: Min A (steadying Pt. > 75%)  FIM - Locomotion: Wheelchair Distance: 100 Locomotion: Wheelchair: 2: Travels 50 - 149 ft with minimal assistance (Pt.>75%) FIM - Locomotion: Ambulation Locomotion: Ambulation Assistive Devices: Administrator Ambulation/Gait Assistance: 4: Min assist;1: +2 Total assist Locomotion: Ambulation: 0: Activity did not occur  Comprehension Comprehension Mode: Auditory Comprehension: 5-Follows basic conversation/direction: With extra time/assistive device  Expression Expression Mode: Verbal Expression: 6-Expresses complex ideas: With extra time/assistive device  Social Interaction Social Interaction: 6-Interacts appropriately with others with medication or extra time (anti-anxiety, antidepressant).  Problem Solving Problem Solving: 5-Solves basic 90% of the time/requires cueing < 10% of the time  Memory Memory: 5-Recognizes or recalls 90% of the time/requires cueing < 10% of the  time  Medical Problem List and Plan:  1. Functional deficits secondary to displaced right femoral neck fracture. 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 Weightbearing as tolerated with anterior hip precautions  2. DVT Prophylaxis/Anticoagulation: Subcutaneous Lovenox.  3. Pain Management: on scheduled dosing q12 and prn dilaudid q6 prn  -reviewed the fact that we have to be careful with her meds. Counseling was provided  -Cymbalta for Fibromyalgia  4. Mood/anxiety/depression: Xanax 1 mg each bedtime. Remains a major issue 5. Neuropsych: This patient is capable of making decisions on her own behalf.  6. Skin/Wound Care: Routine skin checks  7. Fluids/Electrolytes/Nutrition: Followup chemistries was strict I and Os. Provide nutritional supplements as needed  8. Severe pulmonary arterial hypertension. Continue Flolan per cardiology services as well as reduce  Toprol-XL 50 mg daily,HR in usual range thus far  restart Imdur 90 mg daily,Cont  Lasix 80 mg daily.   -appreciated cards follow up 9. Diabetes mellitus with peripheral neuropathy. Latest hemoglobin A1c 6.3. Lantus insulin 8 units each bedtime.  -fair control s 10. Hypothyroidism. Continue Synthroid  11. Hyperlipidemia.Vytorin  12. Recurrent Clostridium difficile. Contact precautions. Vancomycin 125 mg 4 times a day x14 days initiated 08/01/2014  13. COPD/chronic respiratory failure. Oxygen therapy as directed  14.  UTI yeast added diflucan through 10/23  LOS (Days) 8 A FACE TO FACE EVALUATION WAS PERFORMED  Josuha Fontanez T 08/11/2014, 8:13 AM

## 2014-08-11 NOTE — Progress Notes (Signed)
Social Work Patient ID: Meagan [name], female   DOB: [birth year], 68 y.o.   MRN: [id number]  Met with pt and husband yesterday following team conference.  Discussed targeted d/c date of 10/30 with supervision/ min assist goals overall.  Husband immediately states that the date is too far out and notes that she needs to be home sooner than that date in order for them to get their bills taken care of.  Pt then joins husband in insisting that date is "too long" and that her husband can provide all the care she needs at home.  Attempted to explain to both that they need to discuss with therapies today and to hear how her care needs might be different with an earlier d/c date.  Discussed these concerns with OT this morning.  Will follow up later today to see if change in d/c date is agreed on.  Alucard Fearnow, LCSW

## 2014-08-11 NOTE — Progress Notes (Signed)
Weekly note and session notes reviewed, all accurately reflect weekly progress and treatment sessions.

## 2014-08-11 NOTE — Progress Notes (Signed)
Physical Therapy Session Note  Patient Details  Name: ADALEENA MOOERS MRN: [id number] Date of Birth: 1946-06-02  Today's Date: 08/11/2014 PT Individual Time: 0903-1003 PT Individual Time Calculation (min): 60 min   Short Term Goals: Week 1:  PT Short Term Goal 1 (Week 1): Pt will demonstrate supine to sit transfer req mod A.  PT Short Term Goal 2 (Week 1): Pt will demonstrate sit to supine req SBA.  PT Short Term Goal 3 (Week 1): Pt will transfer w/c to/from bed req min A.  PT Short Term Goal 4 (Week 1): Pt will ambulate 15' with walker req min A.  PT Short Term Goal 5 (Week 1): Pt will self propel manual w/c x 50' req mod A.   Skilled Therapeutic Interventions/Progress Updates:   Pt able to state 3/3 anterior hip precautions. Pt's sister observed and assisted with O2 tank.  Gait with RW x 20' with min assist, x 25' with min assist, min cues for sequencing and w/c to follow due to pt's anxiety about falling. No knee buckling or risk during gait this tx.    Sit> stand with min guard, mod cues; stand > sit with mod assist for poor eccentric control.  WC>< mat to L with min guard assist, RW.  Pt is easily confused by VCs for sequencing, and improves with 1 step visual/VC to "sit here". Max cues for all steps to prep w/c and herself for transfer even after several transfers.  Therapeutic exercise performed with LE to increase strength for functional mobility in sitting with limited UE support: 10 x 1 each long arc knee ext with 2# L ankle 0# R ankle;1 x 15 toe raises/calf raises.  Pt with poor short term memory for exs she had just completed.    Therapy Documentation Precautions:  Precautions Precautions: Anterior Hip Precaution Booklet Issued: No Precaution Comments: 6L O2 (used this at home); and medication pump Restrictions Weight Bearing Restrictions: Yes RUE Weight Bearing: Weight bearing as tolerated LUE Weight Bearing: Weight bearing as tolerated RLE Weight Bearing: Weight  bearing as tolerated LLE Weight Bearing: Weight bearing as tolerated   Pain: Pain Assessment Pain Assessment: 0-10 Pain Score: 7  Pain Location: Hip Pain Orientation: Right Pain Intervention(s): Medication (See eMAR)   Locomotion : Ambulation Ambulation/Gait Assistance: 4: Min assist      See FIM for current functional status  Therapy/Group: Individual Therapy  Enaya Howze 08/11/2014, 10:56 AM

## 2014-08-11 NOTE — Progress Notes (Signed)
Social Work Patient ID: TYTIONNA CLOYD, female   DOB: November 01, 1945, 68 y.o.   MRN: [id number]  Lennart Pall, LCSW Social Worker Signed  Patient Care Conference Service date: 08/11/2014 11:59 AM  Inpatient RehabilitationTeam Conference and Plan of Care Update Date: 08/10/2014   Time: 2:25 PM     Patient Name: CHRISTIONA SIDDIQUE       Medical Record Number: [id number]   Date of Birth: [birth year] Sex: Female         Room/Bed: 4W12C/4W12C-01 Payor Info: Payor: MEDICARE / Plan: MEDICARE PART A AND B / Product Type: *No Product type* /   Admitting Diagnosis: R hip fx   Admit Date/Time:  08/03/2014  5:44 PM Admission Comments: No comment available   Primary Diagnosis:  <principal problem not specified> Principal Problem: <principal problem not specified>    Patient Active Problem List     Diagnosis  Date Noted   .  NSVT (nonsustained ventricular tachycardia)  08/03/2014   .  Hip fracture  08/03/2014   .  Right renal mass  07/29/2014   .  Anemia  07/29/2014   .  Pre-operative cardiovascular examination  07/28/2014   .  Closed right hip fracture  07/27/2014   .  CKD (chronic kidney disease), stage III  07/27/2014   .  C. difficile diarrhea  06/08/2014   .  Reflux esophagitis  06/06/2014   .  Upper GI bleed  06/03/2014   .  Pancreatic mass  08/01/2012   .  Hepatomegaly  05/07/2012   .  CAD-native artery     .  GERD  06/20/2010   .  DM (diabetes mellitus), type 2, uncontrolled  05/10/2010   .  Acute on chronic diastolic heart failure  63/10/6008   .  PAH (pulmonary artery hypertension)  07/25/2009   .  Chronic diastolic heart failure       Expected Discharge Date: Expected Discharge Date: 08/20/14  Team Members Present: Physician leading conference: Dr. Alger Simons Social Worker Present: Lennart Pall, LCSW Nurse Present: Elliot Cousin, RN PT Present: Raylene Everts, PT OT Present: Chrys Racer, OT SLP Present: Weston Anna, SLP Other (Discipline and Name): Danne Baxter, RN  [facility]) PPS Coordinator present : Daiva Nakayama, RN, CRRN        Current Status/Progress  Goal  Weekly Team Focus   Medical     right intertroch hip fx, chronic pain, PF  improve activity tolerance  improve pain control, energy conservation   Bowel/Bladder     Continent of bowel and bladder  Min assist  Remain continent of bowel and bladder   Swallow/Nutrition/ Hydration            ADL's     Pt is currently supervision level for UB selfcare and min assist for LB selfcare, min assist for toilet transfers and sit to stand with LB selfcare tasks.  Increased anxiety with most tasks secondary to pain in the right hip.  Decreased ability to remember and carryover techniques from session to session.  supervision overall  selfcare re-training, balance re-training, pt/family education,DME/AE education, functional mobility training, safety   Mobility     min to mod A overall; very limited tolerance  S/min A overall  endurance/activity tolerance, increasing functional mobility, gait training, stair negotiation, strengthening   Communication            Safety/Cognition/ Behavioral Observations           Pain     4mg  Dilaudid q 12hr, 4mg   Dilaudid PRN q 8hr.  <4 on a 0-10 scale  Assess pain q 4hr and reassess after each PRN medication intervention   Skin     Incision with staples to R hip  Remain free from infection/breakdown while on rehab  Assess skin q shift     Rehab Goals Patient on target to meet rehab goals: Yes *See Care Plan and progress notes for long and short-term goals.    Barriers to Discharge:  respiriatory limitations, pain      Possible Resolutions to Barriers:    pain control, pacing/restbreaks, education/supervision by husband      Discharge Planning/Teaching Needs:    home with husband to provide 24/7 assistance      Team Discussion:    Pain appears to be the most inhibiting factor, however, maybe a little decreased today?  Very dependent personality and allows spouse to  domineer.  Poor endurance overall.  Just starting to beginning to use walker. Recommend changing to standard rw.  Address energy conservation.   Revisions to Treatment Plan:    None    Continued Need for Acute Rehabilitation Level of Care: The patient requires daily medical management by a physician with specialized training in physical medicine and rehabilitation for the following conditions: Daily direction of a multidisciplinary physical rehabilitation program to ensure safe treatment while eliciting the highest outcome that is of practical value to the patient.: Yes Daily medical management of patient stability for increased activity during participation in an intensive rehabilitation regime.: Yes Daily analysis of laboratory values and/or radiology reports with any subsequent need for medication adjustment of medical intervention for : Post surgical problems;Pulmonary problems;Neurological problems;Other  Nychelle Cassata, [place] 08/11/2014, 12:00 PM

## 2014-08-12 ENCOUNTER — Inpatient Hospital Stay (HOSPITAL_COMMUNITY): Payer: Medicare Other

## 2014-08-12 ENCOUNTER — Encounter (HOSPITAL_COMMUNITY): Payer: Medicare Other | Admitting: Occupational Therapy

## 2014-08-12 LAB — GLUCOSE, CAPILLARY
GLUCOSE-CAPILLARY: 130 mg/dL — AB (ref 70–99)
GLUCOSE-CAPILLARY: 162 mg/dL — AB (ref 70–99)
Glucose-Capillary: 135 mg/dL — ABNORMAL HIGH (ref 70–99)
Glucose-Capillary: 147 mg/dL — ABNORMAL HIGH (ref 70–99)

## 2014-08-12 NOTE — Progress Notes (Signed)
68 y.o. right-handed female with history of systolic congestive heart failure, severe PAH(pulmonary arterial hypertension) maintained on continuous flofan infusion, fibromyalgia, COPD with chronic oxygen of 6 L, CAD with CABG and diabetes mellitus with peripheral neuropathy. Independent prior to admission living with her husband and used a walker outside of the home. Presented 07/27/2014 after mechanical fall. Denies loss of consciousness or associated chest pain or shortness or breath associated with fall. X-rays and imaging revealed a right hip displaced and angulated femoral neck fracture. Cardiology followup preoperatively and cleared for surgery. Underwent right total hip arthroplasty 07/29/2014 per Dr. Ninfa Linden. Weightbearing as tolerated with anterior hip precautions. Hospital course pain management. Subcutaneous Lovenox for DVT prophylaxis. Acute blood loss anemia and transfuse   Subjective/Complaints: Had a better day and night. Appetite better today also  Review of Systems -. No oversedation  Objective: Vital Signs: Blood pressure 120/56, pulse 91, temperature 98.7 F (37.1 C), temperature source Oral, resp. rate 18, weight 63.368 kg (139 lb 11.2 oz), SpO2 100.00%. No results found. Results for orders placed during the hospital encounter of 08/03/14 (from the past 72 hour(s))  GLUCOSE, CAPILLARY     Status: Abnormal   Collection Time    08/09/14 11:15 AM      Result Value Ref Range   Glucose-Capillary 180 (*) 70 - 99 mg/dL   Comment 1 Notify RN    GLUCOSE, CAPILLARY     Status: Abnormal   Collection Time    08/09/14  4:41 PM      Result Value Ref Range   Glucose-Capillary 163 (*) 70 - 99 mg/dL   Comment 1 Notify RN    GLUCOSE, CAPILLARY     Status: Abnormal   Collection Time    08/09/14  9:04 PM      Result Value Ref Range   Glucose-Capillary 172 (*) 70 - 99 mg/dL  GLUCOSE, CAPILLARY     Status: Abnormal   Collection Time    08/10/14  7:27 AM      Result Value Ref Range    Glucose-Capillary 165 (*) 70 - 99 mg/dL   Comment 1 Notify RN    CREATININE, SERUM     Status: Abnormal   Collection Time    08/10/14  8:15 AM      Result Value Ref Range   Creatinine, Ser 0.84  0.50 - 1.10 mg/dL   GFR calc non Af Amer 69 (*) >90 mL/min   GFR calc Af Amer 80 (*) >90 mL/min   Comment: (NOTE)     The eGFR has been calculated using the CKD EPI equation.     This calculation has not been validated in all clinical situations.     eGFR's persistently <90 mL/min signify possible Chronic Kidney     Disease.  GLUCOSE, CAPILLARY     Status: Abnormal   Collection Time    08/10/14 11:40 AM      Result Value Ref Range   Glucose-Capillary 110 (*) 70 - 99 mg/dL   Comment 1 Notify RN    GLUCOSE, CAPILLARY     Status: Abnormal   Collection Time    08/10/14  5:05 PM      Result Value Ref Range   Glucose-Capillary 139 (*) 70 - 99 mg/dL  GLUCOSE, CAPILLARY     Status: Abnormal   Collection Time    08/10/14  9:08 PM      Result Value Ref Range   Glucose-Capillary 136 (*) 70 - 99 mg/dL  GLUCOSE, CAPILLARY  Status: Abnormal   Collection Time    08/11/14  6:47 AM      Result Value Ref Range   Glucose-Capillary 154 (*) 70 - 99 mg/dL   Comment 1 Notify RN    GLUCOSE, CAPILLARY     Status: Abnormal   Collection Time    08/11/14 11:13 AM      Result Value Ref Range   Glucose-Capillary 161 (*) 70 - 99 mg/dL   Comment 1 Notify RN    GLUCOSE, CAPILLARY     Status: Abnormal   Collection Time    08/11/14  4:45 PM      Result Value Ref Range   Glucose-Capillary 128 (*) 70 - 99 mg/dL  GLUCOSE, CAPILLARY     Status: Abnormal   Collection Time    08/11/14  8:46 PM      Result Value Ref Range   Glucose-Capillary 160 (*) 70 - 99 mg/dL  GLUCOSE, CAPILLARY     Status: Abnormal   Collection Time    08/12/14  6:38 AM      Result Value Ref Range   Glucose-Capillary 130 (*) 70 - 99 mg/dL      Constitutional: She is oriented to person, place, and time. She appears well-developed.   HENT: oral mucosa moist, dentition fair  Head: Normocephalic.  Eyes: EOM are normal.  Neck: Normal range of motion. Neck supple. No thyromegaly present.  Cardiovascular: Normal rate and regular rhythm.  Respiratory: Breath sounds normal. No respiratory distress.  GI: Soft. Bowel sounds are normal. She exhibits no distension.  Neurological: She is alert and oriented to person, place, and time. Fair insight and awareness.  Follows three-step commands. HOH sometimes limiting. UES: 4+ to 5/5. ZOX:WRUEAVW due to pain, 1/5 hf, 1+ KE and 4- ADF/APF. LLE grossly 3+ to 4+/5 prox to distal. No gross sensory findings.  Skin: incision clean, some dry blood/clot proximally along incision  M/S: right hip with mild edema. Area remains very sensitive to touch.  Psychiatric:   Anxiety better   Assessment/Plan: 1. Functional deficits secondary to Right Femoral neck fracture which require 3+ hours per day of interdisciplinary therapy in a comprehensive inpatient rehab setting. Physiatrist is providing close team supervision and 24 hour management of active medical problems listed below. Physiatrist and rehab team continue to assess barriers to discharge/monitor patient progress toward functional and medical goals.  Pt/husband would like to go home sooner---will need to discuss with therapy the potential to revise goals for shorter stay   FIM: FIM - Bathing Bathing Steps Patient Completed: Chest;Right Arm;Left Arm;Abdomen;Front perineal area;Right lower leg (including foot);Left lower leg (including foot);Buttocks;Right upper leg;Left upper leg Bathing: 4: Steadying assist  FIM - Upper Body Dressing/Undressing Upper body dressing/undressing steps patient completed: Thread/unthread left sleeve of pullover shirt/dress;Thread/unthread right sleeve of pullover shirt/dresss;Put head through opening of pull over shirt/dress;Pull shirt over trunk Upper body dressing/undressing: 5: Set-up assist to: Obtain  clothing/put away FIM - Lower Body Dressing/Undressing Lower body dressing/undressing steps patient completed: Thread/unthread right underwear leg;Thread/unthread left underwear leg;Pull underwear up/down;Thread/unthread left pants leg;Don/Doff left sock;Don/Doff right sock;Pull pants up/down;Thread/unthread right pants leg Lower body dressing/undressing: 4: Steadying Assist  FIM - Toileting Toileting steps completed by patient: Adjust clothing prior to toileting;Performs perineal hygiene;Adjust clothing after toileting Toileting Assistive Devices: Grab bar or rail for support Toileting: 4: Steadying assist  FIM - Radio producer Devices: Grab bars;Walker Toilet Transfers: 4-To toilet/BSC: Min A (steadying Pt. > 75%);4-From toilet/BSC: Min A (steadying Pt. >  75%)  FIM - Control and instrumentation engineer Devices: Walker;Arm rests Bed/Chair Transfer: 4: Supine > Sit: Min A (steadying Pt. > 75%/lift 1 leg)  FIM - Locomotion: Wheelchair Distance: 100 Locomotion: Wheelchair: 1: Total Assistance/staff pushes wheelchair (Pt<25%) FIM - Locomotion: Ambulation Locomotion: Ambulation Assistive Devices: Administrator Ambulation/Gait Assistance: 4: Min assist Locomotion: Ambulation: 1: Travels less than 50 ft with minimal assistance (Pt.>75%)  Comprehension Comprehension Mode: Auditory Comprehension: 5-Follows basic conversation/direction: With extra time/assistive device  Expression Expression Mode: Verbal Expression: 6-Expresses complex ideas: With extra time/assistive device  Social Interaction Social Interaction: 6-Interacts appropriately with others with medication or extra time (anti-anxiety, antidepressant).  Problem Solving Problem Solving: 4-Solves basic 75 - 89% of the time/requires cueing 10 - 24% of the time  Memory Memory: 5-Recognizes or recalls 90% of the time/requires cueing < 10% of the time  Medical Problem List and Plan:   1. Functional deficits secondary to displaced right femoral neck fracture. 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 Weightbearing as tolerated with anterior hip precautions  2. DVT Prophylaxis/Anticoagulation: Subcutaneous Lovenox.  3. Pain Management: on scheduled dosing q12 and prn dilaudid q6 prn  -pain control better so far today  -Cymbalta for Fibromyalgia  4. Mood/anxiety/depression: Xanax 1 mg each bedtime. Remains a major issue 5. Neuropsych: This patient is capable of making decisions on her own behalf.  6. Skin/Wound Care: Routine skin checks  7. Fluids/Electrolytes/Nutrition: Followup chemistries was strict I and Os. Provide nutritional supplements as needed  8. Severe pulmonary arterial hypertension. Continue Flolan per cardiology services as well as reduce  Toprol-XL 50 mg daily,HR in usual range thus far  restart Imdur 90 mg daily,Cont  Lasix 80 mg daily.   -appreciated cards follow up 9. Diabetes mellitus with peripheral neuropathy. Latest hemoglobin A1c 6.3. Lantus insulin 8 units each bedtime.  -fair control s 10. Hypothyroidism. Continue Synthroid  11. Hyperlipidemia.Vytorin  12. Recurrent Clostridium difficile. Contact precautions. Vancomycin 125 mg 4 times a day x14 days initiated 08/01/2014  13. COPD/chronic respiratory failure. Oxygen therapy as directed  14.  UTI yeast added diflucan through 10/23  LOS (Days) 9 A FACE TO FACE EVALUATION WAS PERFORMED  Jahn Franchini T 08/12/2014, 9:08 AM

## 2014-08-12 NOTE — Progress Notes (Signed)
Occupational Therapy Session Note  Patient Details  Name: Meagan [name] MRN: [id number] Date of Birth: 01-02-1946  Today's Date: 08/12/2014 OT Individual Time: 0930-1030 OT Individual Calculated Time: 60 min  Short Term Goals: Week 1:  OT Short Term Goal 1 (Week 1): Pt will complete UB/LB dressing using AE prn w/ min A. OT Short Term Goal 1 - Progress (Week 1): Met OT Short Term Goal 2 (Week 1): Pt will complete UB/LB bathing using AE prn w/ min A. OT Short Term Goal 2 - Progress (Week 1): Met OT Short Term Goal 3 (Week 1): Pt will complete toilet t/f onto 3-in-1 commode w/ min A. OT Short Term Goal 3 - Progress (Week 1): Met OT Short Term Goal 4 (Week 1): Pt will be educated on UE strengthening and ROM HEP. OT Short Term Goal 4 - Progress (Week 1): Met Week 2:  OT Short Term Goal 1 (Week 2): STG=LTG due to length of stay  Skilled Therapeutic Interventions/Progress Updates:  Skilled OT session focused on ADL retraining, family education, functional ambulation, UE ROM and strength, dynamic standing balance, and activity tolerance/endurance. Pt seated in w/c w/ husband in room when arriving, reporting 7/10 pain in R hip and had received morning medication. Therapist educated husband on assisting pt w/ ambulating w/ RW and bathing and dressing at sink. Pt ambulated to sink w/ RW and stood to perform UB B&D, w/ A from husband and supervision and mod v/c by therapist. Pt doffed pants and socks while seated in w/c, standing for LB bathing and to pull up pants. Pt used long-handled sponge, reacher, and sock-aid for LB B&D. Pt stood at sink to complete tooth and hair brushing. Therapist educated husband on manipulating O2 line and IV while pt ambulating and need for giving pt occasional physical A and min v/c for safety w/ RW. Therapist also educated husband on allowing pt to be as independent as possible w/ B&D. Pt self-propelled w/c to bedside and completed written reminder of 5 UE strengthening and  ROM exercises (w/c push-ups added to HEP today). Pt completed 10 reps of 5 exercises w/ 1 rest break. Pt seated in w/c w/ all needs nearby when leaving.  Therapy Documentation Precautions:  Precautions Precautions: Anterior Hip;Fall Precaution Booklet Issued: No Precaution Comments: 6L O2 (used this at home); and medication pump Restrictions Weight Bearing Restrictions: No RUE Weight Bearing: Weight bearing as tolerated LUE Weight Bearing: Weight bearing as tolerated RLE Weight Bearing: Weight bearing as tolerated LLE Weight Bearing: Weight bearing as tolerated  See FIM for current functional status  Therapy/Group: Individual Therapy  Sandra Tellefsen Raynell 08/12/2014, 12:04 PM

## 2014-08-12 NOTE — Progress Notes (Signed)
Note reviewed and accurately reflects treatment session.   

## 2014-08-12 NOTE — Progress Notes (Signed)
Subjective:   Continues with some mild jaw pain but able to eat.No dyspnea or CP. Weight down 7 pounds - not sure that is accurate.    Intake/Output Summary (Last 24 hours) at 08/12/14 1236 Last data filed at 08/11/14 1900  Gross per 24 hour  Intake    240 ml  Output      1 ml  Net    239 ml    Current meds: . ALPRAZolam  1 mg Oral QHS  . diclofenac sodium  2 g Topical QID  . DULoxetine  30 mg Oral QHS  . DULoxetine  60 mg Oral Daily  . enoxaparin (LOVENOX) injection  40 mg Subcutaneous Q24H  . epoprostenol  33.45 ng/kg/min (Order-Specific) Intravenous Q24H  . ezetimibe-simvastatin  1 tablet Oral QHS  . furosemide  80 mg Oral Daily  . HYDROmorphone  4 mg Oral Q12H  . ice pack  1 each Other 3 times per day  . insulin aspart  0-15 Units Subcutaneous TID WC  . insulin glargine  8 Units Subcutaneous QHS  . isosorbide mononitrate  90 mg Oral q morning - 10a  . levothyroxine  25 mcg Oral QAC breakfast  . metoprolol succinate  50 mg Oral Daily  . pantoprazole  40 mg Oral Q1200  . potassium chloride SA  40 mEq Oral Daily  . vancomycin  125 mg Oral 4 times per day   Infusions:     Objective:  Blood pressure 120/56, pulse 91, temperature 98.7 F (37.1 C), temperature source Oral, resp. rate 18, weight 63.368 kg (139 lb 11.2 oz), SpO2 100.00%. Weight change:  Physical Exam:  General: Chronically-ill appearing. Lying flat in bed Wearing [place] O2 HEENT: normal  Neck: thick . JVP ~5 Carotids 2+ bilat; no bruits. No lymphadenopathy or thryomegaly appreciated.  Cor: PMI nonpalpable Regular tachy. 2/6 TR. P2 perhaps mildly accentuated but not severely so  Hickman catheter ok  Lungs: clear but decreased throughout no wheezing  Abdomen: obese. soft, nontender, nondistended. No hepatosplenomegaly. No bruits or masses. hypoactivends.  Extremities: no cyanosis, clubbing  Neuro: alert & oriented x 3, cranial nerves grossly intact. moves all 4 extremities w/o difficulty. Affect pleasant.     Lab Results: Basic Metabolic Panel:  Recent Labs Lab 08/07/14 1300 08/10/14 0815  NA 131*  --   K 4.9  --   CL 92*  --   CO2 29  --   GLUCOSE 179*  --   BUN 15  --   CREATININE 0.97 0.84  CALCIUM 8.9  --    Liver Function Tests: No results found for this basename: AST, ALT, ALKPHOS, BILITOT, PROT, ALBUMIN,  in the last 168 hours No results found for this basename: LIPASE, AMYLASE,  in the last 168 hours No results found for this basename: AMMONIA,  in the last 168 hours CBC: No results found for this basename: WBC, NEUTROABS, HGB, HCT, MCV, PLT,  in the last 168 hours Cardiac Enzymes: No results found for this basename: CKTOTAL, CKMB, CKMBINDEX, TROPONINI,  in the last 168 hours BNP: No components found with this basename: POCBNP,  CBG:  Recent Labs Lab 08/11/14 1113 08/11/14 1645 08/11/14 2046 08/12/14 0638 08/12/14 1113  GLUCAP 161* 128* 160* 130* 135*   Microbiology: Lab Results  Component Value Date   CULT  Value: Multiple bacterial morphotypes present, none predominant. Suggest appropriate recollection if clinically indicated. Performed at [facility] 08/05/2014   CULT  Value: NO GROWTH Performed at [facility]  06/03/2014   CULT NO GROWTH 5 DAYS 06/03/2014   CULT NO GROWTH 5 DAYS 06/03/2014   CULT NO GROWTH 3 DAYS 05/14/2012   No results found for this basename: CULT, SDES,  in the last 168 hours  Imaging: No results found.   ASSESSMENT:  1. Hip fracture s/p repair 10/8 2. Severe PAH on flolan  3. CAD s/p CABG. LAD lesion on last cath does not appear to be flow limiting  4. Chronic Diastolic HF  5. H/O Of GI bleed from esophagitis  6. Hypokalemia  7. Diarrhea H/O C diff  8. Hypothyroid  9. DM  10. C. Diff + 11. NSVT 12. Hypomagnesemia   PLAN/DISCUSSION:  Stable from cardiac perspective. Jaw pain may be from Flolan but unusual to get jaw claudication with stable dosing. Will continue current dose. Weight down but BP stable.  Can hold diuretics as needed but would continue for now.      LOS: 9 days  Glori Bickers, MD 08/12/2014, 12:36 PM

## 2014-08-12 NOTE — Progress Notes (Signed)
Physical Therapy Weekly Progress Note  Patient Details  Name: Meagan [name] MRN: [id number] Date of Birth: 04-01-1946  Beginning of progress report period: August 04, 2014 End of progress report period: August 12, 2014  Patient has met 4 of 5 short term goals.  Pt is making slow but steady progress. Main limitations continue to be poor activity tolerance, decreased cardiorespiratory endurance/oxygen support (chronic O2 user), and pain. Pt is currently at a light min A level for most transfers and mobility with RW but unable to tolerate much activity without frequent rest breaks. Focus is on family education with pt and pt's husband as they are wanting to leave sooner than target d/c date of 10/30. Discussed with pt and husband that pt will likely require more A due to decreased activity tolerance but if they feel comfortable with providing this level of care, it can be considered by the team pending pt's progress. Stair negotiation with RW is a main focus of PT at this time as well as they do not have rails at home, so pt is to ascend backwards with RW and husband to provide physical A to manage RW.    Patient continues to demonstrate the following deficits: decreased activity tolerance, decreased O2 support, decreased balance, decreased strength, cues to adhere to precautions, decreased memory, decreased functional mobility and therefore will continue to benefit from skilled PT intervention to enhance overall performance with activity tolerance, balance, ability to compensate for deficits, functional use of  right lower extremity and knowledge of precautions.  Patient progressing toward long term goals..  Continue plan of care. Modified stair goal to reflect pt's home situation; min A up/down 2 stairs without rails using RW.  PT Short Term Goals Week 1:  PT Short Term Goal 1 (Week 1): Pt will demonstrate supine to sit transfer req mod A.  PT Short Term Goal 1 - Progress (Week 1): Met PT Short  Term Goal 2 (Week 1): Pt will demonstrate sit to supine req SBA.  PT Short Term Goal 2 - Progress (Week 1): Progressing toward goal PT Short Term Goal 3 (Week 1): Pt will transfer w/c to/from bed req min A.  PT Short Term Goal 3 - Progress (Week 1): Met PT Short Term Goal 4 (Week 1): Pt will ambulate 15' with walker req min A.  PT Short Term Goal 4 - Progress (Week 1): Met PT Short Term Goal 5 (Week 1): Pt will self propel manual w/c x 50' req mod A.  PT Short Term Goal 5 - Progress (Week 1): Met Week 2:  PT Short Term Goal 1 (Week 2): = LTGs  Skilled Therapeutic Interventions/Progress Updates:  Ambulation/gait training;Discharge planning;Functional mobility training;Psychosocial support;Therapeutic Activities;Balance/vestibular training;Disease management/prevention;Neuromuscular re-education;Skin care/wound management;Therapeutic Exercise;Wheelchair propulsion/positioning;Cognitive remediation/compensation;DME/adaptive equipment instruction;Pain management;Splinting/orthotics;UE/LE Strength taining/ROM;Community reintegration;Patient/family education;Stair training;UE/LE Coordination activities   Therapy Documentation Precautions:  Precautions Precautions: Anterior Hip;Fall Precaution Booklet Issued: No Precaution Comments: 6L O2 (used this at home); and medication pump Restrictions Weight Bearing Restrictions: No RLE Weight Bearing: Weight bearing as tolerated  See FIM for current functional status    Allayne Gitelman 08/12/2014, 2:38 PM

## 2014-08-12 NOTE — Progress Notes (Addendum)
Physical Therapy Session Note  Patient Details  Name: Meagan [name] MRN: [id number] Date of Birth: Apr 12, 1946  Today's Date: 08/12/2014 PT Individual Time: 0800-0900 PT Individual Time Calculation (min): 60 min   Short Term Goals: Week 1:  PT Short Term Goal 1 (Week 1): Pt will demonstrate supine to sit transfer req mod A.  PT Short Term Goal 2 (Week 1): Pt will demonstrate sit to supine req SBA.  PT Short Term Goal 3 (Week 1): Pt will transfer w/c to/from bed req min A.  PT Short Term Goal 4 (Week 1): Pt will ambulate 15' with walker req min A.  PT Short Term Goal 5 (Week 1): Pt will self propel manual w/c x 50' req mod A.   Skilled Therapeutic Interventions/Progress Updates:   Session focused on overall activity tolerance, family education with pt's husband in regards to stair negotiation for home entry (using RW going backwards), gait training with RW, bed mobility, transfers, and recommendations for set-up in home due to pt's decreased ability to tolerate longer distance ambulation. Pt requires cues for hand placement for sit <-> stands as well as technique for stair negotiation with RW. [name] able to provide appropriate cueing. Completed stairs backwards with RW up/down 2 steps like at home without rails with min A for balance and second person (pt's husband) to stabilize RW in front.  Gait with RW x 20' and then x 30' with steady A and cues for foot placement as to limit extension in RLE to maintain precautions. Also discussed d/c date and potential to move up d/c date if pt and pt's husband feel comfortable with level of care; discussed that endurance is limiting factor at this time. Pt and husband want to leave sooner than planned d/c on 10/30.   Therapy Documentation Precautions:  Precautions Precautions: Anterior Hip Precaution Booklet Issued: No Precaution Comments: 6L O2 (used this at home); and medication pump Restrictions Weight Bearing Restrictions: Yes RLE Weight  Bearing: Weight bearing as tolerated  Vital Signs: Oxygen Therapy SpO2: 100 % O2 Device: Nasal cannula O2 Flow Rate (L/min): 6 L/min Pain: Premedicated for pain in RLE and c/o SOB (improves with cues for pursed lip breathing during activity).  See FIM for current functional status  Therapy/Group: Individual Therapy  [name] [facility] 08/12/2014, 10:00 AM

## 2014-08-12 NOTE — Progress Notes (Signed)
Social Work Patient ID: Meagan [name], female   DOB: 08/03/1946, 68 y.o.   MRN: [id number]  Have discussed pt's request of earlier d/c with therapists and MD.  All are agreed with change in d/c date to 10/27 - pt and spouse pleased with this change.  Meagan Cincotta, LCSW

## 2014-08-12 NOTE — Progress Notes (Signed)
Physical Therapy Session Note  Patient Details  Name: Meagan [name] MRN: [id number] Date of Birth: 12/03/1945  Today's Date: 08/12/2014 PT Individual Time: 1303-1403 PT Individual Time Calculation (min): 60 min   Skilled Therapeutic Interventions/Progress Updates:  Session focused on family education with pt and pt's husband in regards to simulated car transfer, bed mobility and transfers OOB and to/from toilet using RW, and stair negotiation. Pt requiring cues to recall information for hand placement and technique during transfers, but pt's husband able to verbally direct pt and provide appropriate cues throughout session. Pt performed simulated car transfer with steady A to close S with husband able to return demonstrate successfully. For stair negotiation, still required +2 for safety with therapist providing A from behind pt for balance. [name] able to direct sequence correctly except placement of RW on second step. Checked husband off on toilet transfers with w/c at the doorway. Verbalized that I do not recommend gait from bed to bathroom with just husband at this time for safety, but OK for him to perform transfers with RW. He was able to safely manage O2 and medication pump tubing and provide cues appropriately to the patient. Wrote this on the safety plan and discussed with primary OT. Transferred back to bed end of session with min A and A for RLE onto the the bed.  Therapy Documentation Precautions:  Precautions Precautions: Anterior Hip;Fall Precaution Booklet Issued: No Precaution Comments: 6L O2 (used this at home); and medication pump Restrictions Weight Bearing Restrictions: No RLE Weight Bearing: Weight bearing as tolerated    Pain: Premedicated for RLE pain.  See FIM for current functional status  Therapy/Group: Individual Therapy  [name] 08/12/2014, 2:36 PM

## 2014-08-13 ENCOUNTER — Encounter (HOSPITAL_COMMUNITY): Payer: Medicare Other | Admitting: Occupational Therapy

## 2014-08-13 ENCOUNTER — Inpatient Hospital Stay (HOSPITAL_COMMUNITY): Payer: Medicare Other

## 2014-08-13 ENCOUNTER — Inpatient Hospital Stay (HOSPITAL_COMMUNITY): Payer: Medicare Other | Admitting: Occupational Therapy

## 2014-08-13 LAB — GLUCOSE, CAPILLARY
Glucose-Capillary: 209 mg/dL — ABNORMAL HIGH (ref 70–99)
Glucose-Capillary: 178 mg/dL — ABNORMAL HIGH (ref 70–99)
Glucose-Capillary: 155 mg/dL — ABNORMAL HIGH (ref 70–99)
Glucose-Capillary: 180 mg/dL — ABNORMAL HIGH (ref 70–99)

## 2014-08-13 MED ORDER — SACCHAROMYCES BOULARDII 250 MG PO CAPS
250.0000 mg | ORAL_CAPSULE | Freq: Two times a day (BID) | ORAL | Status: DC
  Administered 2014-08-13 – 2014-08-17 (×8): 250 mg via ORAL
  Filled 2014-08-13 (×11): qty 1

## 2014-08-13 NOTE — Progress Notes (Signed)
Note reviewed and accurately reflects treatment session.   

## 2014-08-13 NOTE — Progress Notes (Signed)
Occupational Therapy Session Note  Patient Details  Name: Meagan [name] MRN: [id number] Date of Birth: 1945-12-01  Today's Date: 08/13/2014 OT Individual Time: 1400-1508 OT Individual Time Calculation (min): 68 min    Skilled Therapeutic Interventions/Progress Updates:    Pt transferred supine to sit EOB with supervision and then to the wheelchair at the same level using the RW.  She rolled herself to the therapy gym with mod assist from therapist in order to increase time efficiency.  Husband also present for session.  Used interactive WII game system while incorporating sit to stand, functional mobility, safety and balance.  Had pt work on stepping forward over various small barriers with the RW such as a small stick to begin with using the RW and then larger wooden block.  She needed max instructional cueing to sequence stepping over the objects but only close supervision for physical assistance.  Pt continues to need mod instructional cueing for hand placement for sit to stand as well as using her vision to help align her up to the chair before sitting.  Pt's O2 sats remained at 95% on 6Ls nasal cannula throughout session.  She was able to tolerate standing for intervals of 3-4 mins at a time.   Therapy Documentation Precautions:  Precautions Precautions: Anterior Hip;Fall Precaution Booklet Issued: No Precaution Comments: 6L O2 (used this at home); and medication pump Restrictions Weight Bearing Restrictions: Yes RUE Weight Bearing: Touch down weight bearing LUE Weight Bearing: Weight bearing as tolerated RLE Weight Bearing: Weight bearing as tolerated LLE Weight Bearing: Weight bearing as tolerated  Pain: Pain Assessment Pain Assessment: Faces Pain Score: 5  Faces Pain Scale: Hurts little more Pain Type: Acute pain Pain Location: Jaw Pain Orientation: Right;Left Pain Descriptors / Indicators: Aching Pain Frequency: Occasional Pain Onset: Sudden Patients Stated Pain Goal:  2 Pain Intervention(s): Emotional support ADL:  See FIM for current functional status  Therapy/Group: Individual Therapy  Mitesh Rosendahl OTR/L 08/13/2014, 3:33 PM

## 2014-08-13 NOTE — Progress Notes (Signed)
Occupational Therapy Session Note  Patient Details  Name: Meagan [name] MRN: [id number] Date of Birth: [birth year]  Today's Date: 08/13/2014 OT Individual Time: 1000-1100 OT Calculated Individual Time (min): 60 min  Short Term Goals: Week 1:  OT Short Term Goal 1 (Week 1): Pt will complete UB/LB dressing using AE prn w/ min A. OT Short Term Goal 1 - Progress (Week 1): Met OT Short Term Goal 2 (Week 1): Pt will complete UB/LB bathing using AE prn w/ min A. OT Short Term Goal 2 - Progress (Week 1): Met OT Short Term Goal 3 (Week 1): Pt will complete toilet t/f onto 3-in-1 commode w/ min A. OT Short Term Goal 3 - Progress (Week 1): Met OT Short Term Goal 4 (Week 1): Pt will be educated on UE strengthening and ROM HEP. OT Short Term Goal 4 - Progress (Week 1): Met Week 2:  OT Short Term Goal 1 (Week 2): STG=LTG due to length of stay  Skilled Therapeutic Interventions/Progress Updates:  Skilled OT session focusing on ADL retraining, family education, dynamic standing balance, functional ambulation, functional t/f, safety w/ RW, and UE strength and ROM. Pt seated in w/c w/ husband in room when arriving, reporting 6/10 pain in hip and had received morning medication. Pt completed B&D session w/ A from husband, therapist supervised and educated pt and husband on safety and sequencing throughout session. Pt ambulated to sink and performed UB B&D while standing w/ RW. Pt performed LB B&D while seated in w/c using reacher, sock-aid, and long-handled sponge, w/ 2 sit<> stand t/f. Pt requiring more frequent rest breaks than last session, therapist educated pt on importance of breathing through nose to get benefit of nasal cannula. Pt completed tooth brushing and hair brushing while standing at sink. Pt self-propelled w/c to bed side and ambulated to bathroom w/ RW, t/f to toilet, and completed toilet hygiene after voiding. Husband demonstrating safe A techniques and appropriate use of v/c for pt safety. Pt  ambulated w/ RW to w/c at bedside and completed 5/5 exercises for increasing UE strength and ROM. Pt requesting to t/f to bed and t/f to EOB and then to supine. Pt supine in bed w/ all needs nearby when leaving.  Therapy Documentation Precautions:  Precautions Precautions: Anterior Hip;Fall Precaution Booklet Issued: No Precaution Comments: 6L O2 (used this at home); and medication pump Restrictions Weight Bearing Restrictions: Yes RUE Weight Bearing: Touch down weight bearing LUE Weight Bearing: Weight bearing as tolerated RLE Weight Bearing: Weight bearing as tolerated LLE Weight Bearing: Weight bearing as tolerated  See FIM for current functional status  Therapy/Group: Individual Therapy  Meagan [name] 08/13/2014, 12:34 PM

## 2014-08-13 NOTE — Progress Notes (Signed)
Physical Therapy Session Note  Patient Details  Name: Meagan [name] MRN: [id number] Date of Birth: Feb 14, 1946  Today's Date: 08/13/2014 PT Individual Time: 0800-0900 PT Individual Time Calculation (min): 60 min   Short Term Goals: Week 2:  PT Short Term Goal 1 (Week 2): = LTGs  Skilled Therapeutic Interventions/Progress Updates:   Propelled w/c with BUE down to the elevators for general UE strength and overall endurance. Session focused on continuing family education with pt and pt's husband in preparation for d/c. [name] assisting with all transfers with him providing appropriate cueing for technique to pt. Intermittent cues by therapist to pt and husband to increase pt independence. Pt with questions about bed mobility so practiced supine <-> sit from flat surface without rails with min A for sit to supine to assist with RLE approaching S level. Cues for technique to maintain anterior hip precautions. Also provided recommendation for pt and husband to switch sides of the bed as pt likes to let her R leg hang off the EOB per husband's report while sleeping and this would break precautions. Recommended sleeping on the other side so her R leg is in the middle of the bed and use pillows at the foot of the bed to prevent ER and abduction at night. Stair negotiation with RW ascending backwards 2 steps x 2 trials with pt's husband leading. He was able to give appropriate cueing and A but second person (therapist) still needed behind pt for safety with balance. Discussed recommending having a neighbor for family/friend be the second person once returning home to increase confidence as pt and husband expressed concerns. I think they can reach a safe min A level next week with more practice without requiring a second person for safety.  Therapy Documentation Precautions:  Precautions Precautions: Anterior Hip;Fall Precaution Booklet Issued: No Precaution Comments: 6L O2 (used this at home); and  medication pump Restrictions Weight Bearing Restrictions: Yes RLE Weight Bearing: Weight bearing as tolerated Vital Signs: Oxygen Therapy SpO2: 100% O2 Device: Nasal Cannula O2 Flow Rate (L/min): 6 L/min Pain:  No complaints of pain. Premedicated.  See FIM for current functional status  Therapy/Group: Individual Therapy  [name] [facility] 08/13/2014, 11:09 AM

## 2014-08-13 NOTE — Progress Notes (Signed)
68 y.o. right-handed female with history of systolic congestive heart failure, severe PAH(pulmonary arterial hypertension) maintained on continuous flofan infusion, fibromyalgia, COPD with chronic oxygen of 6 L, CAD with CABG and diabetes mellitus with peripheral neuropathy. Independent prior to admission living with her husband and used a walker outside of the home. Presented 07/27/2014 after mechanical fall. Denies loss of consciousness or associated chest pain or shortness or breath associated with fall. X-rays and imaging revealed a right hip displaced and angulated femoral neck fracture. Cardiology followup preoperatively and cleared for surgery. Underwent right total hip arthroplasty 07/29/2014 per Dr. Ninfa Linden. Weightbearing as tolerated with anterior hip precautions. Hospital course pain management. Subcutaneous Lovenox for DVT prophylaxis. Acute blood loss anemia and transfuse   Subjective/Complaints: Feeling better. Happy that dc date moved up. Stools formed.   Review of Systems -. No oversedation  Objective: Vital Signs: Blood pressure 123/62, pulse 99, temperature 98.4 F (36.9 C), temperature source Oral, resp. rate 18, height 5' (1.524 m), weight 61.2 kg (134 lb 14.7 oz), SpO2 97.00%. No results found. Results for orders placed during the hospital encounter of 08/03/14 (from the past 72 hour(s))  GLUCOSE, CAPILLARY     Status: Abnormal   Collection Time    08/10/14 11:40 AM      Result Value Ref Range   Glucose-Capillary 110 (*) 70 - 99 mg/dL   Comment 1 Notify RN    GLUCOSE, CAPILLARY     Status: Abnormal   Collection Time    08/10/14  5:05 PM      Result Value Ref Range   Glucose-Capillary 139 (*) 70 - 99 mg/dL  GLUCOSE, CAPILLARY     Status: Abnormal   Collection Time    08/10/14  9:08 PM      Result Value Ref Range   Glucose-Capillary 136 (*) 70 - 99 mg/dL  GLUCOSE, CAPILLARY     Status: Abnormal   Collection Time    08/11/14  6:47 AM      Result Value Ref Range    Glucose-Capillary 154 (*) 70 - 99 mg/dL   Comment 1 Notify RN    GLUCOSE, CAPILLARY     Status: Abnormal   Collection Time    08/11/14 11:13 AM      Result Value Ref Range   Glucose-Capillary 161 (*) 70 - 99 mg/dL   Comment 1 Notify RN    GLUCOSE, CAPILLARY     Status: Abnormal   Collection Time    08/11/14  4:45 PM      Result Value Ref Range   Glucose-Capillary 128 (*) 70 - 99 mg/dL  GLUCOSE, CAPILLARY     Status: Abnormal   Collection Time    08/11/14  8:46 PM      Result Value Ref Range   Glucose-Capillary 160 (*) 70 - 99 mg/dL  GLUCOSE, CAPILLARY     Status: Abnormal   Collection Time    08/12/14  6:38 AM      Result Value Ref Range   Glucose-Capillary 130 (*) 70 - 99 mg/dL  GLUCOSE, CAPILLARY     Status: Abnormal   Collection Time    08/12/14 11:13 AM      Result Value Ref Range   Glucose-Capillary 135 (*) 70 - 99 mg/dL   Comment 1 Notify RN    GLUCOSE, CAPILLARY     Status: Abnormal   Collection Time    08/12/14  5:27 PM      Result Value Ref Range   Glucose-Capillary  147 (*) 70 - 99 mg/dL  GLUCOSE, CAPILLARY     Status: Abnormal   Collection Time    08/12/14  9:20 PM      Result Value Ref Range   Glucose-Capillary 162 (*) 70 - 99 mg/dL  GLUCOSE, CAPILLARY     Status: Abnormal   Collection Time    08/13/14  7:13 AM      Result Value Ref Range   Glucose-Capillary 209 (*) 70 - 99 mg/dL   Comment 1 Notify RN        Constitutional: She is oriented to person, place, and time. She appears well-developed.  HENT: oral mucosa moist, dentition fair  Head: Normocephalic.  Eyes: EOM are normal.  Neck: Normal range of motion. Neck supple. No thyromegaly present.  Cardiovascular: Normal rate and regular rhythm.  Respiratory: Breath sounds normal. No respiratory distress.  GI: Soft. Bowel sounds are normal. She exhibits no distension.  Neurological: She is alert and oriented to person, place, and time. Fair insight and awareness.  Follows three-step commands. HOH  sometimes limiting. UES: 4+ to 5/5. WNI:OEVOJJK due to pain, 1/5 hf, 1+ KE and 4- ADF/APF. LLE grossly 3+ to 4+/5 prox to distal. No gross sensory findings.  Skin: incision clean, some dry blood/clot proximally along incision  M/S: right hip with mild edema. Area remains very sensitive to touch.  Psychiatric:   Anxiety better   Assessment/Plan: 1. Functional deficits secondary to Right Femoral neck fracture which require 3+ hours per day of interdisciplinary therapy in a comprehensive inpatient rehab setting. Physiatrist is providing close team supervision and 24 hour management of active medical problems listed below. Physiatrist and rehab team continue to assess barriers to discharge/monitor patient progress toward functional and medical goals.  Pt/husband would like to go home sooner---will need to discuss with therapy the potential to revise goals for shorter stay   FIM: FIM - Bathing Bathing Steps Patient Completed: Chest;Right Arm;Left Arm;Abdomen;Front perineal area;Right lower leg (including foot);Left lower leg (including foot);Buttocks;Right upper leg;Left upper leg Bathing: 4: Steadying assist  FIM - Upper Body Dressing/Undressing Upper body dressing/undressing steps patient completed: Thread/unthread left sleeve of pullover shirt/dress;Thread/unthread right sleeve of pullover shirt/dresss;Put head through opening of pull over shirt/dress;Pull shirt over trunk Upper body dressing/undressing: 4: Steadying assist FIM - Lower Body Dressing/Undressing Lower body dressing/undressing steps patient completed: Thread/unthread right underwear leg;Thread/unthread left underwear leg;Pull underwear up/down;Thread/unthread left pants leg;Don/Doff left sock;Don/Doff right sock;Pull pants up/down;Thread/unthread right pants leg Lower body dressing/undressing: 4: Steadying Assist  FIM - Toileting Toileting steps completed by patient: Adjust clothing prior to toileting;Performs perineal  hygiene;Adjust clothing after toileting Toileting Assistive Devices: Grab bar or rail for support Toileting: 0: Activity did not occur  FIM - Radio producer Devices: Mining engineer Transfers: 4-To toilet/BSC: Min A (steadying Pt. > 75%);4-From toilet/BSC: Min A (steadying Pt. > 75%)  FIM - Control and instrumentation engineer Devices: Walker;Arm rests;Bed rails Bed/Chair Transfer: 5: Supine > Sit: Supervision (verbal cues/safety issues);4: Sit > Supine: Min A (steadying pt. > 75%/lift 1 leg);4: Bed > Chair or W/C: Min A (steadying Pt. > 75%);4: Chair or W/C > Bed: Min A (steadying Pt. > 75%)  FIM - Locomotion: Wheelchair Distance: 100 Locomotion: Wheelchair: 1: Total Assistance/staff pushes wheelchair (Pt<25%) FIM - Locomotion: Ambulation Locomotion: Ambulation Assistive Devices: Administrator Ambulation/Gait Assistance: 4: Min assist Locomotion: Ambulation: 1: Travels less than 50 ft with minimal assistance (Pt.>75%)  Comprehension Comprehension Mode: Auditory Comprehension: 5-Follows basic conversation/direction: With extra  time/assistive device  Expression Expression Mode: Verbal Expression: 6-Expresses complex ideas: With extra time/assistive device  Social Interaction Social Interaction: 6-Interacts appropriately with others with medication or extra time (anti-anxiety, antidepressant).  Problem Solving Problem Solving: 4-Solves basic 75 - 89% of the time/requires cueing 10 - 24% of the time  Memory Memory: 4-Recognizes or recalls 75 - 89% of the time/requires cueing 10 - 24% of the time  Medical Problem List and Plan:  1. Functional deficits secondary to displaced right femoral neck fracture. 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 Weightbearing as tolerated with anterior hip precautions  2. DVT Prophylaxis/Anticoagulation: Subcutaneous Lovenox.  3. Pain Management: on scheduled dosing q12 and prn dilaudid q6  prn  -pain control better   -Cymbalta for Fibromyalgia  4. Mood/anxiety/depression: Xanax 1 mg each bedtime. Remains a major issue 5. Neuropsych: This patient is capable of making decisions on her own behalf.  6. Skin/Wound Care: Routine skin checks  7. Fluids/Electrolytes/Nutrition: Followup chemistries was strict I and Os. Provide nutritional supplements as needed  8. Severe pulmonary arterial hypertension. Continue Flolan per cardiology services as well as reduce  Toprol-XL 50 mg daily,HR in usual range thus far  restart Imdur 90 mg daily,Cont  Lasix 80 mg daily.   -appreciated cards follow up 9. Diabetes mellitus with peripheral neuropathy. Latest hemoglobin A1c 6.3. Lantus insulin 8 units each bedtime.  -fair control s 10. Hypothyroidism. Continue Synthroid  11. Hyperlipidemia.Vytorin  12. Recurrent Clostridium difficile. Contact precautions. Vancomycin 125 mg 4 times a day x14 days initiated 08/01/2014   -stool firm, formed  -re-check c diff today to clear from precautions  -probiotic 13. COPD/chronic respiratory failure. Oxygen therapy as directed  14.  UTI yeast added diflucan through today  LOS (Days) 10 A FACE TO FACE EVALUATION WAS PERFORMED  SWARTZ,ZACHARY T 08/13/2014, 9:22 AM

## 2014-08-14 ENCOUNTER — Inpatient Hospital Stay (HOSPITAL_COMMUNITY): Payer: Medicare Other | Admitting: Physical Therapy

## 2014-08-14 LAB — GLUCOSE, CAPILLARY
GLUCOSE-CAPILLARY: 136 mg/dL — AB (ref 70–99)
Glucose-Capillary: 163 mg/dL — ABNORMAL HIGH (ref 70–99)
Glucose-Capillary: 151 mg/dL — ABNORMAL HIGH (ref 70–99)
Glucose-Capillary: 181 mg/dL — ABNORMAL HIGH (ref 70–99)

## 2014-08-14 LAB — CLOSTRIDIUM DIFFICILE BY PCR: CDIFFPCR: NEGATIVE

## 2014-08-14 NOTE — Progress Notes (Signed)
68 y.o. right-handed female with history of systolic congestive heart failure, severe PAH(pulmonary arterial hypertension) maintained on continuous flofan infusion, fibromyalgia, COPD with chronic oxygen of 6 L, CAD with CABG and diabetes mellitus with peripheral neuropathy. Independent prior to admission living with her husband and used a walker outside of the home. Presented 07/27/2014 after mechanical fall. Denies loss of consciousness or associated chest pain or shortness or breath associated with fall. X-rays and imaging revealed a right hip displaced and angulated femoral neck fracture. Cardiology followup preoperatively and cleared for surgery. Underwent right total hip arthroplasty 07/29/2014 per Dr. Ninfa Linden. Weightbearing as tolerated with anterior hip precautions. Hospital course pain management. Subcutaneous Lovenox for DVT prophylaxis. Acute blood loss anemia and transfuse   Subjective/Complaints: Good night. Formed stools. Pain manageable.    Review of Systems -. No oversedation  Objective: Vital Signs: Blood pressure 107/51, pulse 90, temperature 97.6 F (36.4 C), temperature source Oral, resp. rate 18, height 5' (1.524 m), weight 62.3 kg (137 lb 5.6 oz), SpO2 100.00%. No results found. Results for orders placed during the hospital encounter of 08/03/14 (from the past 72 hour(s))  GLUCOSE, CAPILLARY     Status: Abnormal   Collection Time    08/11/14 11:13 AM      Result Value Ref Range   Glucose-Capillary 161 (*) 70 - 99 mg/dL   Comment 1 Notify RN    GLUCOSE, CAPILLARY     Status: Abnormal   Collection Time    08/11/14  4:45 PM      Result Value Ref Range   Glucose-Capillary 128 (*) 70 - 99 mg/dL  GLUCOSE, CAPILLARY     Status: Abnormal   Collection Time    08/11/14  8:46 PM      Result Value Ref Range   Glucose-Capillary 160 (*) 70 - 99 mg/dL  GLUCOSE, CAPILLARY     Status: Abnormal   Collection Time    08/12/14  6:38 AM      Result Value Ref Range   Glucose-Capillary 130 (*) 70 - 99 mg/dL  GLUCOSE, CAPILLARY     Status: Abnormal   Collection Time    08/12/14 11:13 AM      Result Value Ref Range   Glucose-Capillary 135 (*) 70 - 99 mg/dL   Comment 1 Notify RN    GLUCOSE, CAPILLARY     Status: Abnormal   Collection Time    08/12/14  5:27 PM      Result Value Ref Range   Glucose-Capillary 147 (*) 70 - 99 mg/dL  GLUCOSE, CAPILLARY     Status: Abnormal   Collection Time    08/12/14  9:20 PM      Result Value Ref Range   Glucose-Capillary 162 (*) 70 - 99 mg/dL  GLUCOSE, CAPILLARY     Status: Abnormal   Collection Time    08/13/14  7:13 AM      Result Value Ref Range   Glucose-Capillary 209 (*) 70 - 99 mg/dL   Comment 1 Notify RN    GLUCOSE, CAPILLARY     Status: Abnormal   Collection Time    08/13/14 11:17 AM      Result Value Ref Range   Glucose-Capillary 178 (*) 70 - 99 mg/dL   Comment 1 Notify RN    GLUCOSE, CAPILLARY     Status: Abnormal   Collection Time    08/13/14  4:28 PM      Result Value Ref Range   Glucose-Capillary 155 (*) 70 -  99 mg/dL   Comment 1 Notify RN    GLUCOSE, CAPILLARY     Status: Abnormal   Collection Time    08/13/14  9:31 PM      Result Value Ref Range   Glucose-Capillary 180 (*) 70 - 99 mg/dL  GLUCOSE, CAPILLARY     Status: Abnormal   Collection Time    08/14/14  7:10 AM      Result Value Ref Range   Glucose-Capillary 163 (*) 70 - 99 mg/dL      Constitutional: She is oriented to person, place, and time. She appears well-developed.  HENT: oral mucosa moist, dentition fair  Head: Normocephalic.  Eyes: EOM are normal.  Neck: Normal range of motion. Neck supple. No thyromegaly present.  Cardiovascular: Normal rate and regular rhythm.  Respiratory: Breath sounds normal. No respiratory distress.  GI: Soft. Bowel sounds are normal. She exhibits no distension.  Neurological: She is alert and oriented to person, place, and time. Fair insight and awareness.  Follows three-step commands. HOH  sometimes limiting. UES: 4+ to 5/5. YBO:FBPZWCH due to pain, 1/5 hf, 1+ KE and 4- ADF/APF. LLE grossly 3+ to 4+/5 prox to distal. No gross sensory findings.  Skin: incision clean, some dry blood/clot proximally along incision  M/S: right hip with mild edema. Area remains very sensitive to touch.  Psychiatric:   Anxiety better   Assessment/Plan: 1. Functional deficits secondary to Right Femoral neck fracture which require 3+ hours per day of interdisciplinary therapy in a comprehensive inpatient rehab setting. Physiatrist is providing close team supervision and 24 hour management of active medical problems listed below. Physiatrist and rehab team continue to assess barriers to discharge/monitor patient progress toward functional and medical goals.  Pt/husband would like to go home sooner---will need to discuss with therapy the potential to revise goals for shorter stay   FIM: FIM - Bathing Bathing Steps Patient Completed: Chest;Right Arm;Left Arm;Abdomen;Front perineal area;Right lower leg (including foot);Left lower leg (including foot);Buttocks;Right upper leg;Left upper leg Bathing: 4: Steadying assist  FIM - Upper Body Dressing/Undressing Upper body dressing/undressing steps patient completed: Thread/unthread left sleeve of pullover shirt/dress;Thread/unthread right sleeve of pullover shirt/dresss;Put head through opening of pull over shirt/dress;Pull shirt over trunk Upper body dressing/undressing: 5: Set-up assist to: Obtain clothing/put away FIM - Lower Body Dressing/Undressing Lower body dressing/undressing steps patient completed: Thread/unthread right underwear leg;Thread/unthread left underwear leg;Pull underwear up/down;Thread/unthread left pants leg;Don/Doff left sock;Don/Doff right sock;Pull pants up/down;Thread/unthread right pants leg Lower body dressing/undressing: 4: Steadying Assist  FIM - Toileting Toileting steps completed by patient: Adjust clothing prior to  toileting;Performs perineal hygiene;Adjust clothing after toileting Toileting Assistive Devices: Grab bar or rail for support Toileting: 4: Steadying assist  FIM - Radio producer Devices: Mining engineer Transfers: 4-To toilet/BSC: Min A (steadying Pt. > 75%);4-From toilet/BSC: Min A (steadying Pt. > 75%)  FIM - Control and instrumentation engineer Devices: Walker;Arm rests Bed/Chair Transfer: 5: Sit > Supine: Supervision (verbal cues/safety issues);5: Bed > Chair or W/C: Supervision (verbal cues/safety issues)  FIM - Locomotion: Wheelchair Distance: 100 Locomotion: Wheelchair: 2: Travels 50 - 149 ft with supervision, cueing or coaxing FIM - Locomotion: Ambulation Locomotion: Ambulation Assistive Devices: Administrator Ambulation/Gait Assistance: 4: Min assist Locomotion: Ambulation: 1: Travels less than 50 ft with minimal assistance (Pt.>75%)  Comprehension Comprehension Mode: Auditory Comprehension: 5-Follows basic conversation/direction: With extra time/assistive device  Expression Expression Mode: Verbal Expression: 6-Expresses complex ideas: With extra time/assistive device  Social Interaction Social Interaction: 6-Interacts appropriately  with others with medication or extra time (anti-anxiety, antidepressant).  Problem Solving Problem Solving: 5-Solves basic 90% of the time/requires cueing < 10% of the time  Memory Memory: 5-Recognizes or recalls 90% of the time/requires cueing < 10% of the time  Medical Problem List and Plan:  1. Functional deficits secondary to displaced right femoral neck fracture. 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 Weightbearing as tolerated with anterior hip precautions  2. DVT Prophylaxis/Anticoagulation: Subcutaneous Lovenox.  3. Pain Management: on scheduled dosing q12 and prn dilaudid q6 prn  -pain control better   -Cymbalta for Fibromyalgia  4. Mood/anxiety/depression: Xanax 1 mg  each bedtime. Remains a major issue 5. Neuropsych: This patient is capable of making decisions on her own behalf.  6. Skin/Wound Care: Routine skin checks  7. Fluids/Electrolytes/Nutrition: Followup chemistries was strict I and Os. Provide nutritional supplements as needed  8. Severe pulmonary arterial hypertension. Continue Flolan per cardiology services as well as reduce  Toprol-XL 50 mg daily,HR in usual range thus far  restart Imdur 90 mg daily,Cont  Lasix 80 mg daily.   -appreciated cards follow up 9. Diabetes mellitus with peripheral neuropathy. Latest hemoglobin A1c 6.3. Lantus insulin 8 units each bedtime.  -fair control at [facility]. SSI for spikes 10. Hypothyroidism. Continue Synthroid  11. Hyperlipidemia.Vytorin  12. Recurrent Clostridium difficile. Contact precautions. Vancomycin 125 mg 4 times a day x14 days initiated 08/01/2014   -stool  Formed and regular pattern  -c diff specimen still pending---dc contact precautions if negative  -probiotic 13. COPD/chronic respiratory failure. Oxygen therapy as directed  14.  UTI yeast added diflucan through today  LOS (Days) 11 A FACE TO FACE EVALUATION WAS PERFORMED  SWARTZ,ZACHARY T 08/14/2014, 8:43 AM

## 2014-08-14 NOTE — Progress Notes (Signed)
Physical Therapy Session Note  Patient Details  Name: Meagan [name] MRN: [id number] Date of Birth: [birth year]  Today's Date: 08/14/2014 PT Individual Time: 9872-1587 PT Individual Time Calculation (min): 30 min   Short Term Goals: Week 1:  PT Short Term Goal 1 (Week 1): Pt will demonstrate supine to sit transfer req mod A.  PT Short Term Goal 1 - Progress (Week 1): Met PT Short Term Goal 2 (Week 1): Pt will demonstrate sit to supine req SBA.  PT Short Term Goal 2 - Progress (Week 1): Progressing toward goal PT Short Term Goal 3 (Week 1): Pt will transfer w/c to/from bed req min A.  PT Short Term Goal 3 - Progress (Week 1): Met PT Short Term Goal 4 (Week 1): Pt will ambulate 15' with walker req min A.  PT Short Term Goal 4 - Progress (Week 1): Met PT Short Term Goal 5 (Week 1): Pt will self propel manual w/c x 50' req mod A.  PT Short Term Goal 5 - Progress (Week 1): Met  Skilled Therapeutic Interventions/Progress Updates:  Pt was seen bedside in the pm. Pt transported to rehab gym. Treatment focused family training with husband to perform 2 stair backwards with husband. Pt and husband performed 2 stairs going up backwards with rolling walker and going down forwards with rolling walker without rails. [name] assisted with transfer, therapist acted as a stand by. [name] performed transfer with pt safely. Continue to recommend second person stand by for safety. Pt and husband in agreement.   Therapy Documentation Precautions:  Precautions Precautions: Anterior Hip;Fall Precaution Booklet Issued: No Precaution Comments: 6L O2 (used this at home); and medication pump Restrictions Weight Bearing Restrictions: Yes RUE Weight Bearing: Touch down weight bearing LUE Weight Bearing: Weight bearing as tolerated RLE Weight Bearing: Weight bearing as tolerated LLE Weight Bearing: Weight bearing as tolerated General:   Pain: Pt c/o 7/10 pain R hip.   See FIM for current  functional status  Therapy/Group: Individual Therapy  [name] 08/14/2014, 3:26 PM

## 2014-08-15 ENCOUNTER — Inpatient Hospital Stay (HOSPITAL_COMMUNITY): Payer: Medicare Other

## 2014-08-15 LAB — GLUCOSE, CAPILLARY
GLUCOSE-CAPILLARY: 174 mg/dL — AB (ref 70–99)
GLUCOSE-CAPILLARY: 136 mg/dL — AB (ref 70–99)
Glucose-Capillary: 172 mg/dL — ABNORMAL HIGH (ref 70–99)
Glucose-Capillary: 160 mg/dL — ABNORMAL HIGH (ref 70–99)

## 2014-08-15 MED ORDER — ONDANSETRON HCL 4 MG/2ML IJ SOLN: 4.0000 mg | Freq: Once | INTRAMUSCULAR | Status: AC

## 2014-08-15 MED ORDER — ONDANSETRON HCL 4 MG/2ML IJ SOLN
4.0000 mg | Freq: Once | INTRAMUSCULAR | Status: AC
  Administered 2014-08-15: 4 mg via INTRAVENOUS
  Filled 2014-08-15: qty 2

## 2014-08-15 MED ORDER — SODIUM CHLORIDE 0.45 % IV SOLN
INTRAVENOUS | Status: DC
  Administered 2014-08-15 – 2014-08-16 (×2): via INTRAVENOUS

## 2014-08-15 NOTE — Progress Notes (Signed)
Physical Therapy Session Note  Patient Details  Name: Meagan [name] MRN: [id number] Date of Birth: September 17, 1946  Today's Date: 08/15/2014 PT Individual Time: 0900-1000 PT Individual Time Calculation (min): 60 min   Short Term Goals: Week 2:  PT Short Term Goal 1 (Week 2): = LTGs  Skilled Therapeutic Interventions/Progress Updates:   Session focused on overall activity tolerance and endurance, gait training with RW (cues for maintaining precautions for step to pattern as not to go into R hip extension) x 50' x2 trials, dynamic gait through obstacle course for household environment simulation 25' x2 navigating turns and stepping over threshold with overall close S and verbal cues for technique and gait pattern, w.c propulsion for general UE strengthening and endurance, and family education with pt's husband in regards to car transfer with RW return demonstration safely as well as up/down 2 steps with RW without rails and improved overall today with second person not needed. Pt and husband report they still plan to have a second person present for stairs (I think this is a good idea), but was able to do just pt and husband safely today with pt's husband providing correct cueing.  Therapy Documentation Precautions:  Precautions Precautions: Anterior Hip;Fall Precaution Booklet Issued: No Precaution Comments: 6L O2 (used this at home); and medication pump Restrictions Weight Bearing Restrictions: Yes RLE Weight Bearing: Weight bearing as tolerated  Vital Signs:  6L O2 via [place] Pain: Premedicated for hip pain. See FIM for current functional status  Therapy/Group: Individual Therapy  [name] [facility] 08/15/2014, 11:37 AM

## 2014-08-15 NOTE — Progress Notes (Signed)
68 y.o. right-handed female with history of systolic congestive heart failure, severe PAH(pulmonary arterial hypertension) maintained on continuous flofan infusion, fibromyalgia, COPD with chronic oxygen of 6 L, CAD with CABG and diabetes mellitus with peripheral neuropathy. Independent prior to admission living with her husband and used a walker outside of the home. Presented 07/27/2014 after mechanical fall. Denies loss of consciousness or associated chest pain or shortness or breath associated with fall. X-rays and imaging revealed a right hip displaced and angulated femoral neck fracture. Cardiology followup preoperatively and cleared for surgery. Underwent right total hip arthroplasty 07/29/2014 per Dr. Ninfa Linden. Weightbearing as tolerated with anterior hip precautions. Hospital course pain management. Subcutaneous Lovenox for DVT prophylaxis. Acute blood loss anemia and transfuse   Subjective/Complaints: Continues to do fairly well. Pain under control.    Review of Systems -. No oversedation  Objective: Vital Signs: Blood pressure 121/58, pulse 84, temperature 97.4 F (36.3 C), temperature source Oral, resp. rate 18, height 5' (1.524 m), weight 63 kg (138 lb 14.2 oz), SpO2 100.00%. No results found. Results for orders placed during the hospital encounter of 08/03/14 (from the past 72 hour(s))  GLUCOSE, CAPILLARY     Status: Abnormal   Collection Time    08/12/14 11:13 AM      Result Value Ref Range   Glucose-Capillary 135 (*) 70 - 99 mg/dL   Comment 1 Notify RN    GLUCOSE, CAPILLARY     Status: Abnormal   Collection Time    08/12/14  5:27 PM      Result Value Ref Range   Glucose-Capillary 147 (*) 70 - 99 mg/dL  GLUCOSE, CAPILLARY     Status: Abnormal   Collection Time    08/12/14  9:20 PM      Result Value Ref Range   Glucose-Capillary 162 (*) 70 - 99 mg/dL  GLUCOSE, CAPILLARY     Status: Abnormal   Collection Time    08/13/14  7:13 AM      Result Value Ref Range    Glucose-Capillary 209 (*) 70 - 99 mg/dL   Comment 1 Notify RN    GLUCOSE, CAPILLARY     Status: Abnormal   Collection Time    08/13/14 11:17 AM      Result Value Ref Range   Glucose-Capillary 178 (*) 70 - 99 mg/dL   Comment 1 Notify RN    GLUCOSE, CAPILLARY     Status: Abnormal   Collection Time    08/13/14  4:28 PM      Result Value Ref Range   Glucose-Capillary 155 (*) 70 - 99 mg/dL   Comment 1 Notify RN    GLUCOSE, CAPILLARY     Status: Abnormal   Collection Time    08/13/14  9:31 PM      Result Value Ref Range   Glucose-Capillary 180 (*) 70 - 99 mg/dL  CLOSTRIDIUM DIFFICILE BY PCR     Status: None   Collection Time    08/14/14  3:54 AM      Result Value Ref Range   C difficile by pcr NEGATIVE  NEGATIVE  GLUCOSE, CAPILLARY     Status: Abnormal   Collection Time    08/14/14  7:10 AM      Result Value Ref Range   Glucose-Capillary 163 (*) 70 - 99 mg/dL  GLUCOSE, CAPILLARY     Status: Abnormal   Collection Time    08/14/14 11:36 AM      Result Value Ref Range  Glucose-Capillary 151 (*) 70 - 99 mg/dL  GLUCOSE, CAPILLARY     Status: Abnormal   Collection Time    08/14/14  4:46 PM      Result Value Ref Range   Glucose-Capillary 136 (*) 70 - 99 mg/dL  GLUCOSE, CAPILLARY     Status: Abnormal   Collection Time    08/14/14  9:16 PM      Result Value Ref Range   Glucose-Capillary 181 (*) 70 - 99 mg/dL  GLUCOSE, CAPILLARY     Status: Abnormal   Collection Time    08/15/14  7:08 AM      Result Value Ref Range   Glucose-Capillary 174 (*) 70 - 99 mg/dL      Constitutional: She is oriented to person, place, and time. She appears well-developed.  HENT: oral mucosa moist, dentition fair  Head: Normocephalic.  Eyes: EOM are normal.  Neck: Normal range of motion. Neck supple. No thyromegaly present.  Cardiovascular: Normal rate and regular rhythm.  Respiratory: Breath sounds normal. No respiratory distress.  GI: Soft. Bowel sounds are normal. She exhibits no distension.   Neurological: She is alert and oriented to person, place, and time. Fair insight and awareness.  Follows three-step commands. HOH sometimes limiting. UES: 4+ to 5/5. BDZ:[id number] due to pain, 1/5 hf, 1+ KE and 4- ADF/APF. LLE grossly 3+ to 4+/5 prox to distal. No gross sensory findings.  Skin: incision clean, some dry blood/clot proximally along incision  M/S: right hip with mild edema. Area remains very sensitive to touch.  Psychiatric:   Anxiety better   Assessment/Plan: 1. Functional deficits secondary to Right Femoral neck fracture which require 3+ hours per day of interdisciplinary therapy in a comprehensive inpatient rehab setting. Physiatrist is providing close team supervision and 24 hour management of active medical problems listed below. Physiatrist and rehab team continue to assess barriers to discharge/monitor patient progress toward functional and medical goals.  Pt/husband would like to go home sooner---will need to discuss with therapy the potential to revise goals for shorter stay   FIM: FIM - Bathing Bathing Steps Patient Completed: Chest;Right Arm;Left Arm;Abdomen;Front perineal area;Right lower leg (including foot);Left lower leg (including foot);Buttocks;Right upper leg;Left upper leg Bathing: 4: Steadying assist  FIM - Upper Body Dressing/Undressing Upper body dressing/undressing steps patient completed: Thread/unthread left sleeve of pullover shirt/dress;Thread/unthread right sleeve of pullover shirt/dresss;Put head through opening of pull over shirt/dress;Pull shirt over trunk Upper body dressing/undressing: 5: Set-up assist to: Obtain clothing/put away FIM - Lower Body Dressing/Undressing Lower body dressing/undressing steps patient completed: Thread/unthread right underwear leg;Thread/unthread left underwear leg;Pull underwear up/down;Thread/unthread left pants leg;Don/Doff left sock;Don/Doff right sock;Pull pants up/down;Thread/unthread right pants leg Lower body  dressing/undressing: 4: Steadying Assist  FIM - Toileting Toileting steps completed by patient: Adjust clothing prior to toileting;Performs perineal hygiene;Adjust clothing after toileting Toileting Assistive Devices: Grab bar or rail for support Toileting: 4: Steadying assist  FIM - Radio producer Devices: Mining engineer Transfers: 4-To toilet/BSC: Min A (steadying Pt. > 75%);4-From toilet/BSC: Min A (steadying Pt. > 75%)  FIM - Control and instrumentation engineer Devices: Walker;Arm rests Bed/Chair Transfer: 5: Sit > Supine: Supervision (verbal cues/safety issues);5: Bed > Chair or W/C: Supervision (verbal cues/safety issues)  FIM - Locomotion: Wheelchair Distance: 100 Locomotion: Wheelchair: 2: Travels 50 - 149 ft with supervision, cueing or coaxing FIM - Locomotion: Ambulation Locomotion: Ambulation Assistive Devices: Administrator Ambulation/Gait Assistance: 4: Min assist Locomotion: Ambulation: 1: Travels less than 50 ft with minimal  assistance (Pt.>75%)  Comprehension Comprehension Mode: Auditory Comprehension: 5-Follows basic conversation/direction: With extra time/assistive device  Expression Expression Mode: Verbal Expression: 6-Expresses complex ideas: With extra time/assistive device  Social Interaction Social Interaction: 6-Interacts appropriately with others with medication or extra time (anti-anxiety, antidepressant).  Problem Solving Problem Solving: 5-Solves basic 90% of the time/requires cueing < 10% of the time  Memory Memory: 5-Recognizes or recalls 90% of the time/requires cueing < 10% of the time  Medical Problem List and Plan:  1. Functional deficits secondary to displaced right femoral neck fracture. 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 Weightbearing as tolerated with anterior hip precautions  2. DVT Prophylaxis/Anticoagulation: Subcutaneous Lovenox.  3. Pain Management: on scheduled  dosing q12 and prn dilaudid q6 prn  -pain control better   -Cymbalta for Fibromyalgia  4. Mood/anxiety/depression: Xanax 1 mg each bedtime. Remains a major issue 5. Neuropsych: This patient is capable of making decisions on her own behalf.  6. Skin/Wound Care: Routine skin checks   -remove staples today 7. Fluids/Electrolytes/Nutrition: Followup chemistries was strict I and Os. Provide nutritional supplements as needed  8. Severe pulmonary arterial hypertension. Continue Flolan per cardiology services as well as reduce  Toprol-XL 50 mg daily,HR in usual range thus far  restart Imdur 90 mg daily,Cont  Lasix 80 mg daily.   -appreciated cards follow up 9. Diabetes mellitus with peripheral neuropathy. Latest hemoglobin A1c 6.3. Lantus insulin 8 units each bedtime.  -fair control at [facility]. SSI for spikes 10. Hypothyroidism. Continue Synthroid  11. Hyperlipidemia.Vytorin  12. Recurrent Clostridium difficile. Contact precautions. Vancomycin 125 mg 4 times a day x14 days initiated 08/01/2014   -stool  Formed and regular pattern  -c diff specimen negative---dc contact precautions  -probiotic 13. COPD/chronic respiratory failure. Oxygen therapy as directed  14.  UTI yeast added diflucan through today  LOS (Days) 12 A FACE TO FACE EVALUATION WAS PERFORMED  Sina Lucchesi T 08/15/2014, 8:09 AM

## 2014-08-15 NOTE — Progress Notes (Signed)
Patient c/o headache and requested peppermint oil.   Obtained lotion with one drop of peppermint oil mixed in and applied to temples.   Patient states relief.   Will continue to monitor.   Earlie Lou

## 2014-08-16 ENCOUNTER — Inpatient Hospital Stay (HOSPITAL_COMMUNITY): Payer: Medicare Other

## 2014-08-16 ENCOUNTER — Inpatient Hospital Stay (HOSPITAL_COMMUNITY): Payer: Medicare Other | Admitting: Occupational Therapy

## 2014-08-16 ENCOUNTER — Encounter (HOSPITAL_COMMUNITY): Payer: Medicare Other | Admitting: Occupational Therapy

## 2014-08-16 DIAGNOSIS — K922 Gastrointestinal hemorrhage, unspecified: Secondary | ICD-10-CM

## 2014-08-16 LAB — COMPREHENSIVE METABOLIC PANEL
ALBUMIN: 2.9 g/dL — AB (ref 3.5–5.2)
ALT: 7 U/L (ref 0–35)
ANION GAP: 15 (ref 5–15)
AST: 16 U/L (ref 0–37)
Alkaline Phosphatase: 108 U/L (ref 39–117)
BUN: 17 mg/dL (ref 6–23)
CALCIUM: 9.2 mg/dL (ref 8.4–10.5)
CO2: 24 mEq/L (ref 19–32)
CREATININE: 0.85 mg/dL (ref 0.50–1.10)
Chloride: 90 mEq/L — ABNORMAL LOW (ref 96–112)
GFR calc Af Amer: 79 mL/min — ABNORMAL LOW (ref >90)
GFR calc non Af Amer: 68 mL/min — ABNORMAL LOW (ref >90)
Glucose, Bld: 203 mg/dL — ABNORMAL HIGH (ref 70–99)
Potassium: 5.1 mEq/L (ref 3.7–5.3)
Sodium: 129 mEq/L — ABNORMAL LOW (ref 137–147)
Total Bilirubin: 0.4 mg/dL (ref 0.3–1.2)
Total Protein: 6.8 g/dL (ref 6.0–8.3)

## 2014-08-16 LAB — GLUCOSE, CAPILLARY
GLUCOSE-CAPILLARY: 163 mg/dL — AB (ref 70–99)
GLUCOSE-CAPILLARY: 179 mg/dL — AB (ref 70–99)
GLUCOSE-CAPILLARY: 135 mg/dL — AB (ref 70–99)
Glucose-Capillary: 197 mg/dL — ABNORMAL HIGH (ref 70–99)

## 2014-08-16 LAB — CBC
HEMATOCRIT: 28.9 % — AB (ref 36.0–46.0)
Hemoglobin: 9.5 g/dL — ABNORMAL LOW (ref 12.0–15.0)
MCH: 28.4 pg (ref 26.0–34.0)
MCHC: 32.9 g/dL (ref 30.0–36.0)
MCV: 86.5 fL (ref 78.0–100.0)
Platelets: 437 10*3/uL — ABNORMAL HIGH (ref 150–400)
RBC: 3.34 MIL/uL — ABNORMAL LOW (ref 3.87–5.11)
RDW: 14.5 % (ref 11.5–15.5)
WBC: 12.1 10*3/uL — ABNORMAL HIGH (ref 4.0–10.5)

## 2014-08-16 MED ORDER — PROCHLORPERAZINE MALEATE 5 MG PO TABS
5.0000 mg | ORAL_TABLET | Freq: Four times a day (QID) | ORAL | Status: DC | PRN
  Administered 2014-08-16 – 2014-08-17 (×3): 5 mg via ORAL
  Filled 2014-08-16 (×7): qty 1

## 2014-08-16 MED ORDER — PROCHLORPERAZINE EDISYLATE 5 MG/ML IJ SOLN: 10.0000 mg | Freq: Four times a day (QID) | INTRAMUSCULAR | Status: DC | PRN

## 2014-08-16 MED ORDER — PROCHLORPERAZINE EDISYLATE 5 MG/ML IJ SOLN
5.0000 mg | Freq: Once | INTRAMUSCULAR | Status: AC
  Administered 2014-08-16: 5 mg via INTRAVENOUS
  Filled 2014-08-16: qty 2

## 2014-08-16 MED ORDER — FAMOTIDINE 20 MG PO TABS
20.0000 mg | ORAL_TABLET | Freq: Two times a day (BID) | ORAL | Status: DC
  Administered 2014-08-16 – 2014-08-17 (×3): 20 mg via ORAL
  Filled 2014-08-16 (×7): qty 1

## 2014-08-16 MED ORDER — FAMOTIDINE IN NACL 20-0.9 MG/50ML-% IV SOLN
20.0000 mg | Freq: Once | INTRAVENOUS | Status: AC
  Administered 2014-08-16: 20 mg via INTRAVENOUS
  Filled 2014-08-16: qty 50

## 2014-08-16 NOTE — Progress Notes (Signed)
Patient awake with more nausea and vomiting with brown mucous stool x 3 at 0230. Dr. Naaman Plummer notified. Orders given for Compazine  5mg  IV x 1 and Pepside 20mg  IV x 1. Patient able to tolerate PO medications this AM at 0630. adm

## 2014-08-16 NOTE — Progress Notes (Signed)
Occupational Therapy Note  Patient Details  Name: Meagan [name] MRN: [id number] Date of Birth: [birth year]  Pt missed 60 min of skilled therapy due to pt refusal secondary to pt nausea, vomiting, and headache. Notified scheduling team. RN notified and aware.  [name] 08/16/2014, 9:26 AM

## 2014-08-16 NOTE — Discharge Summary (Signed)
NAMECONCETTINA, LETH              ACCOUNT NO.:  [id number]  MEDICAL RECORD NO.:  [id number]  LOCATION:  4W12C                        FACILITY:  [facility]  PHYSICIAN:  Meredith Staggers, M.D.DATE OF BIRTH:  Nov 06, 1945  DATE OF ADMISSION:  08/03/2014 DATE OF DISCHARGE:  08/17/2014                              DISCHARGE SUMMARY   DISCHARGE DIAGNOSES: 1. Functional deficits secondary to displaced right femoral neck     fracture status post right hip hemiarthroplasty. 2. Subcutaneous Lovenox for deep venous thrombosis prophylaxis. 3. Chronic pain management. 4. Anxiety with depression. 5. Severe pulmonary arterial hypertension. 6. Diabetes mellitus with peripheral neuropathy. 7. Hypothyroidism. 8. Hyperlipidemia. 9. Recurrent Clostridium difficile. 10.Chronic obstructive pulmonary disease with chronic respiratory     failure. 11.Yeast urinary tract infection.  HISTORY OF PRESENT ILLNESS:  This is a 68 year old right-handed female, multi medical with history of congestive heart failure; severe pulmonary arterial hypertension, maintained on continuos Flolan infusion; COPD with chronic oxygen.  Independent, prior to admission living with her husband, used a walker outside of the home.  Presented on July 27, 2014, after mechanical fall.  Denies loss of consciousness or associated chest pain or shortness of breath associated with the fall.  X-rays and imaging revealed a right hip displaced femoral neck fracture. Cardiology clearance and underwent right total hip arthroplasty on July 29, 2014, per Dr. Ninfa Linden.  Weightbearing as tolerated with anterior hip precautions.  Hospital course, pain management. Subcutaneous Lovenox for DVT prophylaxis.  Acute blood loss anemia, transfused.  The patient with history of recurrent Clostridium difficile, maintained on contact precautions as well as vancomycin 125 mg 4 times daily x14 days initiated on August 01, 2014.  Physical and occupational  therapy ongoing, the patient was admitted for comprehensive rehab program.  PAST MEDICAL HISTORY:  See discharge diagnoses.  SOCIAL HISTORY:  Lives with spouse.  Functional history prior to admission, independent, used a cane outside the home.  Functional status upon admission to rehab services was moderate assist for ambulation 3 feet with a rolling walker, +2 physical assist, sit to stand; min to mod assist, activities of daily living.  PHYSICAL EXAMINATION:  VITAL SIGNS:  Blood pressure 126/53, pulse 112, temperature 99, respirations 18. GENERAL:  This was an alert female, hard of hearing.  She did follow simple commands.  Oriented x3. HEENT:  Pupils were round and reactive to light. CARDIAC:  Rate controlled. ABDOMEN:  Soft, nontender.  Good bowel sounds. LUNGS:  No respiratory distress. SKIN:  Hip incision clean and dry with staples.  REHABILITATION HOSPITAL COURSE:  The patient was admitted to inpatient rehab services with therapies initiated on a 3-hour daily basis consisting of physical therapy, occupational therapy, and rehabilitation nursing.  The following issues were addressed during the patient's rehabilitation stay.  Pertaining to Ms. Viney's displaced right femoral neck fracture, she had undergone right total hip arthroplasty on July 29, 2014, would follow up with Orthopedic Services. Weightbearing as tolerated with anterior hip precautions.  She continued on subcutaneous Lovenox for DVT prophylaxis.  Venous Doppler studies negative.  Chronic pain management.  She remained on her Cymbalta 30 mg at bedtime and 60 mg daily, Voltaren gel as directed, Dilaudid 4 mg every  12 hours at home regimen as well as every 6 hours as needed for pain and close monitoring of neurological status.  Long history of anxiety, depression.  She remained on Xanax at bedtime.  Severe pulmonary arterial hypertension followed by Cardiology Services.  She remained on continuous Flolan as  prior to hospital admission.  She exhibited no signs of fluid overload.  She did have a history of diabetes mellitus with peripheral neuropathy, latest hemoglobin A1c 6.3, she remained on insulin therapy.  Long history of recurrent Clostridium difficile, contact precautions, she was completing a 14-day course of vancomycin, latest C. diff specimen negative.  The patient received weekly collaborative interdisciplinary team conferences to discuss estimated length of stay, family teaching, and any barriers to her discharge.  Treatments focused on family education with her husband to perform aid in functional mobility.  The patient's husband performed stairs going up backwards with a rolling walker and going down forward with rolling walker.  The patient's husband assisted with transfers as well as functional mobility with a rolling walker.  Activities of daily living, she rolled herself to the therapy, gym with moderate assist from therapist in order to increase time efficiency.  She needed max instructional cuing to sequence stepping over objects but only close supervision for physical assistance.  She continued with her oxygen therapy throughout her sessions as prior to hospital admission.  Full family teaching was completed and plan was to be discharged to home with home health physical and occupational therapy.  DISCHARGE MEDICATIONS: 1. Xanax 1 mg p.o. at bedtime. 2. Voltaren gel 4 times daily to affected areas. 3. Cymbalta 60 mg daily, 30 mg at bedtime. 4. Continue Flolan as prior to hospital admission continuous infusion. 5. Vytorin 10-40 one tablet at bedtime. 6. Lasix 80 mg p.o. daily. 7. Dilaudid 4 mg p.o. every 12 hours and 4 mg every 6 hours as needed     severe pain. 8. Lantus insulin 8 units subcutaneous at bedtime. 9. Imdur 90 mg p.o. daily. 10.Synthroid 25 mcg daily. 11.Toprol-XL 50 mg p.o. daily. 12.Nitroglycerin as needed. 13.Protonix 40 mg p.o. daily. 14.Potassium  chloride 40 mEq p.o. daily. 15.Florastor 250 mg p.o. b.i.d. 16.Ocean nasal spray as needed.  DIET:  Diabetic diet.  SPECIAL INSTRUCTIONS:  The patient would follow up with Dr. Alger Simons at the outpatient rehab service office as needed; Dr. Jean Rosenthal in 2 weeks call for appointment; Dr. Glori Bickers, call for appointment; Dr. Redmond School, medical management.  Special instructions, weightbearing as tolerated right lower extremity with anterior hip precautions.  Keep wound clean and dry.  Continue oxygen as prior to hospital admission.     Lauraine Rinne, P.A.   ______________________________ Meredith Staggers, M.D.    DA/MEDQ  D:  08/16/2014  T:  08/16/2014  Job:  [id number]  cc:   Shaune Pascal. Bensimhon, MD Meredith Staggers, M.D. Sherrilee Gilles. Gerarda Fraction, MD Lind Guest. Ninfa Linden, M.D.

## 2014-08-16 NOTE — Discharge Summary (Signed)
Discharge summary job [id number]

## 2014-08-16 NOTE — Progress Notes (Signed)
Occupational Therapy Session Note  Patient Details  Name: Meagan [name] MRN: [id number] Date of Birth: February 01, 1946  Today's Date: 08/16/2014 OT Individual Time: 1300-1330 OT Individual Time Calculation (min): 30 min   Skilled Therapeutic Interventions/Progress Updates:    Pt agreed to sit up on the EOB to begin session.  She reported still feeling nauseated some but slightly better than earlier.  Her husband was present and assisted as needed during session.  She was able to transition from supine to sitting EOB with close supervision.  BP checked sitting EOB at 146/62.  Pt with no reports of dizziness during session.  She was able to transfer stand pivot to the wheelchair with supervision and min questioning cues for hand placement.  Her husband provided the supervision for sit to stand and transfer.  Rolled pt over to the chair and had her work on standing while performing simple grooming tasks.  She was able to stand for and interval of 4 1/2 mins before needing rest breaks.  Noted at times she was able to stand without UE support while she was removing cap from the toothpaste and applying it.  Positioned in wheelchair at end of session with husband and nursing present.  Oxygen sats 98% on 6Ls nasal cannula during session.   Therapy Documentation Precautions:  Precautions Precautions: Anterior Hip;Fall Precaution Booklet Issued: No Precaution Comments: 6L O2 (used this at home); and medication pump Restrictions Weight Bearing Restrictions: Yes RUE Weight Bearing: Touch down weight bearing LUE Weight Bearing: Weight bearing as tolerated RLE Weight Bearing: Weight bearing as tolerated LLE Weight Bearing: Weight bearing as tolerated  Pain: Pain Assessment Pain Assessment: No/denies pain Pain Score: 3  ADL: See FIM for current functional status  Therapy/Group: Individual Therapy  Maliea Grandmaison OTR/L 08/16/2014, 3:17 PM

## 2014-08-16 NOTE — Progress Notes (Signed)
[facility]  Patient Status: Active (receiving services up to time of hospitalization)  [facility] is providing the following services: RN  If patient discharges after hours, please call [phone].   Amy Bregman 08/16/2014, 10:05 AM

## 2014-08-16 NOTE — Progress Notes (Signed)
Physical Therapy Note  Patient Details  Name: Meagan [name] MRN: [id number] Date of Birth: [birth year] Today's Date: 08/16/2014    Attempted to see patient again at 11am to make up missed session at 9am, but pt asleep and husband is at bedside reporting she has been sleeping all morning. Scheduling team made aware to schedule for full day tomorrow in case pt does not d/c for medical reasons. Encouraged pt's husband to have pt participate in PM therapy sessions today if at all possible.    [name] [facility] 08/16/2014, 11:13 AM

## 2014-08-16 NOTE — Progress Notes (Signed)
Physical Therapy Session Note  Patient Details  Name: Meagan [name] MRN: [id number] Date of Birth: Dec 24, 1945  Today's Date: 08/16/2014 PT Individual Time: 1500-1530 PT Individual Time Calculation (min): 30 min   Short Term Goals: Week 2:  PT Short Term Goal 1 (Week 2): = LTGs  Skilled Therapeutic Interventions/Progress Updates:    Pt received supine in bed, agreeable to participate in therapy in room. Pt able to name 3/4 anterior hip precautions w/ min cueing. Pt moved supine>sit w/ supervision. Pt reported mild nausea after moving supine>sit, did not feel she would become sick. Pt moved sit<>stand w/ close supervision. Performed x10 calf raises and marches standing w/ RW at EOB, SBA to maintain balance. Reported need for bathroom. Pt ambulated 15' to toilet w/ husband providing MinGuard A and managing oxygen tubing. Pt ambulated back to bed, sat at EOB for 2 minutes complaining of SOB. O2 sats >95%. Pt asked to lay back down to rest. Moved supine w/ supervision, repositioned up in bed w/ bed rails and Trendelenburg position. Pt left supine in bed w/ husband present and all needs within reach.   Therapy Documentation Precautions:  Precautions Precautions: Anterior Hip;Fall Precaution Booklet Issued: No Precaution Comments: 6L O2 (used this at home); and medication pump Restrictions Weight Bearing Restrictions: Yes RUE Weight Bearing: Touch down weight bearing LUE Weight Bearing: Weight bearing as tolerated RLE Weight Bearing: Weight bearing as tolerated LLE Weight Bearing: Weight bearing as tolerated General:   Vital Signs: Therapy Vitals Temp: 98.3 F (36.8 C) Temp Source: Oral Pulse Rate: 103 Resp: 16 BP: 154/59 mmHg Patient Position (if appropriate): Lying Oxygen Therapy SpO2: 100 % O2 Device: Nasal Cannula O2 Flow Rate (L/min): 6 L/min Pain: Pain Assessment Pain Assessment: 0-10 Pain Score: 3  Mobility:   Locomotion :    Trunk/Postural Assessment :     Balance:   Exercises:   Other Treatments:    See FIM for current functional status  Therapy/Group: Individual Therapy  Rada Hay Rada Hay, PT, DPT 08/16/2014, 3:13 PM

## 2014-08-16 NOTE — Progress Notes (Signed)
Patient assisted up to bedside commode with husband at HS. Nurse observed patient unresponsive, then gradually back to base line. Patient assisted  Back to bed. Vital signs T 97.5 P 88 R 18 BP 134/53 PO 100% on 6  L. Dr. Naaman Plummer notified. Orders given to start IV with 1/2 NsL @ 63ml/hr. Give Zofran IV x 1.  MR if nausea continues in one hour. ADM

## 2014-08-16 NOTE — Progress Notes (Signed)
68 y.o. right-handed female with history of systolic congestive heart failure, severe PAH(pulmonary arterial hypertension) maintained on continuous flofan infusion, fibromyalgia, COPD with chronic oxygen of 6 L, CAD with CABG and diabetes mellitus with peripheral neuropathy. Independent prior to admission living with her husband and used a walker outside of the home. Presented 07/27/2014 after mechanical fall. Denies loss of consciousness or associated chest pain or shortness or breath associated with fall. X-rays and imaging revealed a right hip displaced and angulated femoral neck fracture. Cardiology followup preoperatively and cleared for surgery. Underwent right total hip arthroplasty 07/29/2014 per Dr. Ninfa Linden. Weightbearing as tolerated with anterior hip precautions. Hospital course pain management. Subcutaneous Lovenox for DVT prophylaxis. Acute blood loss anemia and transfuse   Subjective/Complaints: Nauseas last night after dinner. Unresponsive to zofran. Compazine and pepcid perhaps worked a little better. No abdominal pain, cramping, no fever.  Although she has low grade temp  Review of Systems -. No oversedation  Objective: Vital Signs: Blood pressure 120/77, pulse 114, temperature 99 F (37.2 C), temperature source Oral, resp. rate 18, height 5' (1.524 m), weight 64.1 kg (141 lb 5 oz), SpO2 100.00%. No results found. Results for orders placed during the hospital encounter of 08/03/14 (from the past 72 hour(s))  GLUCOSE, CAPILLARY     Status: Abnormal   Collection Time    08/13/14 11:17 AM      Result Value Ref Range   Glucose-Capillary 178 (*) 70 - 99 mg/dL   Comment 1 Notify RN    GLUCOSE, CAPILLARY     Status: Abnormal   Collection Time    08/13/14  4:28 PM      Result Value Ref Range   Glucose-Capillary 155 (*) 70 - 99 mg/dL   Comment 1 Notify RN    GLUCOSE, CAPILLARY     Status: Abnormal   Collection Time    08/13/14  9:31 PM      Result Value Ref Range    Glucose-Capillary 180 (*) 70 - 99 mg/dL  CLOSTRIDIUM DIFFICILE BY PCR     Status: None   Collection Time    08/14/14  3:54 AM      Result Value Ref Range   C difficile by pcr NEGATIVE  NEGATIVE  GLUCOSE, CAPILLARY     Status: Abnormal   Collection Time    08/14/14  7:10 AM      Result Value Ref Range   Glucose-Capillary 163 (*) 70 - 99 mg/dL  GLUCOSE, CAPILLARY     Status: Abnormal   Collection Time    08/14/14 11:36 AM      Result Value Ref Range   Glucose-Capillary 151 (*) 70 - 99 mg/dL  GLUCOSE, CAPILLARY     Status: Abnormal   Collection Time    08/14/14  4:46 PM      Result Value Ref Range   Glucose-Capillary 136 (*) 70 - 99 mg/dL  GLUCOSE, CAPILLARY     Status: Abnormal   Collection Time    08/14/14  9:16 PM      Result Value Ref Range   Glucose-Capillary 181 (*) 70 - 99 mg/dL  GLUCOSE, CAPILLARY     Status: Abnormal   Collection Time    08/15/14  7:08 AM      Result Value Ref Range   Glucose-Capillary 174 (*) 70 - 99 mg/dL  GLUCOSE, CAPILLARY     Status: Abnormal   Collection Time    08/15/14 11:37 AM      Result Value Ref  Range   Glucose-Capillary 172 (*) 70 - 99 mg/dL   Comment 1 Notify RN    GLUCOSE, CAPILLARY     Status: Abnormal   Collection Time    08/15/14  4:54 PM      Result Value Ref Range   Glucose-Capillary 136 (*) 70 - 99 mg/dL   Comment 1 Notify RN    GLUCOSE, CAPILLARY     Status: Abnormal   Collection Time    08/15/14  9:28 PM      Result Value Ref Range   Glucose-Capillary 160 (*) 70 - 99 mg/dL  GLUCOSE, CAPILLARY     Status: Abnormal   Collection Time    08/16/14  7:36 AM      Result Value Ref Range   Glucose-Capillary 197 (*) 70 - 99 mg/dL      Constitutional: She is oriented to person, place, and time. She appears well-developed.  HENT: oral mucosa moist, dentition fair  Head: Normocephalic.  Eyes: EOM are normal.  Neck: Normal range of motion. Neck supple. No thyromegaly present.  Cardiovascular: Normal rate and regular rhythm.   Respiratory: Breath sounds normal. No respiratory distress.  GI: Soft. Bowel sounds are normal. She exhibits no distension. No pain with palpation Neurological: She is alert and oriented to person, place, and time. Fair insight and awareness.  Follows three-step commands. HOH sometimes limiting. UES: 4+ to 5/5. GLO:VFIEPPI due to pain, 1/5 hf, 1+ KE and 4- ADF/APF. LLE grossly 3+ to 4+/5 prox to distal. No gross sensory findings.  Skin: incision clean, some dry blood/clot proximally along incision  M/S: right hip with mild edema. Area remains very sensitive to touch.  Psychiatric:   Anxious.    Assessment/Plan: 1. Functional deficits secondary to Right Femoral neck fracture which require 3+ hours per day of interdisciplinary therapy in a comprehensive inpatient rehab setting. Physiatrist is providing close team supervision and 24 hour management of active medical problems listed below. Physiatrist and rehab team continue to assess barriers to discharge/monitor patient progress toward functional and medical goals.     FIM: FIM - Bathing Bathing Steps Patient Completed: Chest;Right Arm;Left Arm;Abdomen;Front perineal area;Right lower leg (including foot);Left lower leg (including foot);Buttocks;Right upper leg;Left upper leg Bathing: 4: Steadying assist  FIM - Upper Body Dressing/Undressing Upper body dressing/undressing steps patient completed: Thread/unthread left sleeve of pullover shirt/dress;Thread/unthread right sleeve of pullover shirt/dresss;Put head through opening of pull over shirt/dress;Pull shirt over trunk Upper body dressing/undressing: 5: Set-up assist to: Obtain clothing/put away FIM - Lower Body Dressing/Undressing Lower body dressing/undressing steps patient completed: Thread/unthread right underwear leg;Thread/unthread left underwear leg;Pull underwear up/down;Thread/unthread left pants leg;Don/Doff left sock;Don/Doff right sock;Pull pants up/down;Thread/unthread right  pants leg Lower body dressing/undressing: 4: Steadying Assist  FIM - Toileting Toileting steps completed by patient: Adjust clothing prior to toileting;Performs perineal hygiene;Adjust clothing after toileting Toileting Assistive Devices: Grab bar or rail for support Toileting: 4: Steadying assist  FIM - Radio producer Devices: Mining engineer Transfers: 4-To toilet/BSC: Min A (steadying Pt. > 75%);4-From toilet/BSC: Min A (steadying Pt. > 75%)  FIM - Control and instrumentation engineer Devices: Walker;Arm rests Bed/Chair Transfer: 5: Sit > Supine: Supervision (verbal cues/safety issues);5: Bed > Chair or W/C: Supervision (verbal cues/safety issues)  FIM - Locomotion: Wheelchair Distance: 100 Locomotion: Wheelchair: 2: Travels 50 - 149 ft with supervision, cueing or coaxing FIM - Locomotion: Ambulation Locomotion: Ambulation Assistive Devices: Administrator Ambulation/Gait Assistance: 5: Supervision Locomotion: Ambulation: 2: Travels 50 - 149 ft  with supervision/safety issues  Comprehension Comprehension Mode: Auditory Comprehension: 5-Follows basic conversation/direction: With extra time/assistive device  Expression Expression Mode: Verbal Expression: 6-Expresses complex ideas: With extra time/assistive device  Social Interaction Social Interaction: 6-Interacts appropriately with others with medication or extra time (anti-anxiety, antidepressant).  Problem Solving Problem Solving: 5-Solves basic 90% of the time/requires cueing < 10% of the time  Memory Memory: 5-Recognizes or recalls 90% of the time/requires cueing < 10% of the time  Medical Problem List and Plan:  1. Functional deficits secondary to displaced right femoral neck fracture. 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 Weightbearing as tolerated with anterior hip precautions  2. DVT Prophylaxis/Anticoagulation: Subcutaneous Lovenox.  3. Pain Management:  on scheduled dosing q12 and prn dilaudid q6 prn  -pain control better   -Cymbalta for Fibromyalgia  4. Mood/anxiety/depression: Xanax 1 mg each bedtime. Remains a major issue 5. Neuropsych: This patient is capable of making decisions on her own behalf.  6. Skin/Wound Care: Routine skin checks   -removed staples   7. Fluids/Electrolytes/Nutrition: Followup chemistries was strict I and Os. Provide nutritional supplements as needed  8. Severe pulmonary arterial hypertension. Continue Flolan per cardiology services as well as reduce  Toprol-XL 50 mg daily,HR in usual range thus far  restart Imdur 90 mg daily,Cont  Lasix 80 mg daily.   -appreciated cards follow up 9. Diabetes mellitus with peripheral neuropathy. Latest hemoglobin A1c 6.3. Lantus insulin 8 units each bedtime.  -fair control at [employer]. SSI for spikes 10. Hypothyroidism. Continue Synthroid  11. Hyperlipidemia.Vytorin  12. Recurrent Clostridium difficile. Contact precautions. Vancomycin 125 mg 4 times a day x14 days initiated 08/01/2014   -now with nausea this morning and some associated mucousy stools per rn  -has hx of UGI bleed  -c diff specimen negative---dc contact precautions  -probiotic 13. COPD/chronic respiratory failure. Oxygen therapy as directed  14.  UTI yeast--rxed with diflucan LOS (Days) 13 A FACE TO FACE EVALUATION WAS PERFORMED  Meagan [name] 08/16/2014, 8:17 AM

## 2014-08-16 NOTE — Progress Notes (Signed)
Physical Therapy Note  Patient Details  Name: Meagan [name] MRN: [id number] Date of Birth: 1946/06/14 Today's Date: 08/16/2014    Pt missed 60 min of skilled PT due to c/o nausea, vomiting, and fatigue. [name] at bedside reporting that she has been throwing up since dinner last night and was unable to participate in OT session this AM. Pt asleep and did not engage with this therapist. Discussed that despite planned d/c for tomorrow, will need to schedule therapies at least in the AM if medically stable to d/c. [name] states "I am not sure we will get to go home tomorrow." Will follow up as able. RN already aware of symptoms.  [name] [facility] 08/16/2014, 9:50 AM

## 2014-08-16 NOTE — Progress Notes (Signed)
Note reviewed and accurately reflects cancellation.

## 2014-08-16 NOTE — Progress Notes (Signed)
Physical Therapy Session Note  Patient Details  Name: Meagan [name] MRN: [id number] Date of Birth: 09/13/1946  Today's Date: 08/16/2014 PT Individual Time: 1635-1700 PT Individual Time Calculation (min): 25 min   Short Term Goals: Week 1:  PT Short Term Goal 1 (Week 1): Pt will demonstrate supine to sit transfer req mod A.  PT Short Term Goal 1 - Progress (Week 1): Met PT Short Term Goal 2 (Week 1): Pt will demonstrate sit to supine req SBA.  PT Short Term Goal 2 - Progress (Week 1): Progressing toward goal PT Short Term Goal 3 (Week 1): Pt will transfer w/c to/from bed req min A.  PT Short Term Goal 3 - Progress (Week 1): Met PT Short Term Goal 4 (Week 1): Pt will ambulate 15' with walker req min A.  PT Short Term Goal 4 - Progress (Week 1): Met PT Short Term Goal 5 (Week 1): Pt will self propel manual w/c x 50' req mod A.  PT Short Term Goal 5 - Progress (Week 1): Met  Skilled Therapeutic Interventions/Progress Updates:  1:1. Pt received supine in bed, agreeable to therapy. Focus this session on activity tolerance to functional transfers and mobility. Pt req supervision for t/f sup>sit EOB w/ use of bed rails as well as SPT bed>w/c with use of RW. Pt with fair tolerance to w/c propulsion 100'x2 with B UE, supervision and increased time. [name] providing safe min A and appropriate cues for safety and seq during negotiation up/down 2 steps backwards with use of RW. Pt left sitting in w/c at end of session w/ all needs in reach and husband in room.   Therapy Documentation Precautions:  Precautions Precautions: Anterior Hip;Fall Precaution Booklet Issued: No Precaution Comments: 6L O2 (used this at home); and medication pump Restrictions Weight Bearing Restrictions: Yes RUE Weight Bearing: Touch down weight bearing LUE Weight Bearing: Weight bearing as tolerated RLE Weight Bearing: Weight bearing as tolerated LLE Weight Bearing: Weight bearing as tolerated Vital  Signs: Therapy Vitals Temp: 98.3 F (36.8 C) Temp Source: Oral Pulse Rate: 103 Resp: 16 BP: 154/59 mmHg Patient Position (if appropriate): Lying Oxygen Therapy SpO2: 100 % O2 Device: Nasal Cannula O2 Flow Rate (L/min): 6 L/min Pain: Pain Assessment Pain Assessment: 0-10 Pain Score: 3   See FIM for current functional status  Therapy/Group: Individual Therapy  Gilmore Laroche 08/16/2014, 5:19 PM

## 2014-08-17 ENCOUNTER — Encounter (HOSPITAL_COMMUNITY): Payer: Medicare Other | Admitting: Occupational Therapy

## 2014-08-17 ENCOUNTER — Inpatient Hospital Stay (HOSPITAL_COMMUNITY): Payer: Medicare Other | Admitting: Physical Therapy

## 2014-08-17 DIAGNOSIS — K210 Gastro-esophageal reflux disease with esophagitis: Secondary | ICD-10-CM

## 2014-08-17 LAB — GLUCOSE, CAPILLARY: Glucose-Capillary: 173 mg/dL — ABNORMAL HIGH (ref 70–99)

## 2014-08-17 LAB — BASIC METABOLIC PANEL
ANION GAP: 13 (ref 5–15)
BUN: 14 mg/dL (ref 6–23)
CO2: 28 meq/L (ref 19–32)
CREATININE: 1.01 mg/dL (ref 0.50–1.10)
Calcium: 9.3 mg/dL (ref 8.4–10.5)
Chloride: 95 mEq/L — ABNORMAL LOW (ref 96–112)
GFR calc Af Amer: 64 mL/min — ABNORMAL LOW (ref >90)
GFR calc non Af Amer: 55 mL/min — ABNORMAL LOW (ref >90)
GLUCOSE: 172 mg/dL — AB (ref 70–99)
Potassium: 4.2 mEq/L (ref 3.7–5.3)
Sodium: 136 mEq/L — ABNORMAL LOW (ref 137–147)

## 2014-08-17 MED ORDER — ISOSORBIDE MONONITRATE ER 60 MG PO TB24: 90.0000 mg | ORAL_TABLET | Freq: Every morning | ORAL | Status: DC

## 2014-08-17 MED ORDER — DICLOFENAC SODIUM 1 % TD GEL
2.0000 g | Freq: Four times a day (QID) | TRANSDERMAL | Status: AC
Start: 2014-08-17 — End: ?

## 2014-08-17 MED ORDER — HYDROMORPHONE HCL 4 MG PO TABS: 4.0000 mg | ORAL_TABLET | Freq: Two times a day (BID) | ORAL | Status: DC

## 2014-08-17 MED ORDER — DULOXETINE HCL 30 MG PO CPEP
30.0000 mg | ORAL_CAPSULE | Freq: Every day | ORAL | Status: AC
Start: 2014-08-17 — End: ?

## 2014-08-17 MED ORDER — METOPROLOL SUCCINATE ER 50 MG PO TB24: 50.0000 mg | ORAL_TABLET | Freq: Every day | ORAL | Status: AC

## 2014-08-17 MED ORDER — ALPRAZOLAM 0.5 MG PO TABS: 1.0000 mg | ORAL_TABLET | Freq: Every day | ORAL | Status: AC

## 2014-08-17 MED ORDER — INSULIN GLARGINE 100 UNIT/ML ~~LOC~~ SOLN: 8.0000 [IU] | Freq: Every day | SUBCUTANEOUS | Status: AC

## 2014-08-17 MED ORDER — POTASSIUM CHLORIDE CRYS ER 20 MEQ PO TBCR: 40.0000 meq | EXTENDED_RELEASE_TABLET | Freq: Every day | ORAL | Status: DC

## 2014-08-17 MED ORDER — PROCHLORPERAZINE MALEATE 5 MG PO TABS: 5.0000 mg | ORAL_TABLET | Freq: Four times a day (QID) | ORAL | Status: AC | PRN

## 2014-08-17 MED ORDER — EZETIMIBE-SIMVASTATIN 10-40 MG PO TABS: 1.0000 | ORAL_TABLET | Freq: Every evening | ORAL | Status: AC

## 2014-08-17 MED ORDER — HYDROMORPHONE HCL 4 MG PO TABS: 4.0000 mg | ORAL_TABLET | Freq: Four times a day (QID) | ORAL | Status: DC | PRN

## 2014-08-17 MED ORDER — OMEPRAZOLE 20 MG PO CPDR: 20.0000 mg | DELAYED_RELEASE_CAPSULE | Freq: Two times a day (BID) | ORAL | Status: DC

## 2014-08-17 MED ORDER — DULOXETINE HCL 60 MG PO CPEP: 60.0000 mg | ORAL_CAPSULE | ORAL | Status: AC

## 2014-08-17 MED ORDER — LEVOTHYROXINE SODIUM 25 MCG PO TABS: 25.0000 ug | ORAL_TABLET | Freq: Every morning | ORAL | Status: AC

## 2014-08-17 MED ORDER — FUROSEMIDE 40 MG PO TABS: 80.0000 mg | ORAL_TABLET | Freq: Every day | ORAL | Status: AC

## 2014-08-17 MED ORDER — SACCHAROMYCES BOULARDII 250 MG PO CAPS: 250.0000 mg | ORAL_CAPSULE | Freq: Two times a day (BID) | ORAL | Status: AC

## 2014-08-17 MED ORDER — EZETIMIBE-SIMVASTATIN 10-40 MG PO TABS
30.0000 | ORAL_TABLET | Freq: Every day | ORAL | Status: DC
Start: 2014-08-17 — End: 2014-10-20

## 2014-08-17 NOTE — Progress Notes (Signed)
Social Work  Discharge Note  The overall goal for the admission was met for:   Discharge location: Yes - home with spouse providing 24/7 assist  Length of Stay: Yes - 14 days  Discharge activity level: Yes - minimal assistance  Home/community participation: Yes  Services provided included: MD, RD, PT, OT, RN, CM, Pharmacy and [place]: Medicare and Private Insurance: [place]  Follow-up services arranged: Home Health: RN, PT, OT via [place], DME: youth rolling walker via [facility] and Patient/Family request agency HH: [facility], DME: [facility]  Comments (or additional information):  Patient/Family verbalized understanding of follow-up arrangements: Yes  Individual responsible for coordination of the follow-up plan: patient  Confirmed correct DME delivered: Geroldine Esquivias 08/17/2014    [place], [place]

## 2014-08-17 NOTE — Progress Notes (Signed)
Discharge note reviewed and accurately reflects discharge summary.  

## 2014-08-17 NOTE — Discharge Instructions (Signed)
Inpatient Rehab Discharge Instructions  Jeris Easterly [facility] Discharge date and time: No discharge date for patient encounter.   Activities/Precautions/ Functional Status: Activity: Weightbearing as tolerated right lower extremity with anterior hip cautions Diet: diabetic diet Wound Care: keep wound clean and dry Functional status:  ___ No restrictions     ___ Walk up steps independently _x__ 24/7 supervision/assistance   ___ Walk up steps with assistance ___ Intermittent supervision/assistance  ___ Bathe/dress independently ___ Walk with walker     ___ Bathe/dress with assistance ___ Walk Independently    ___ Shower independently _x__ Walk with assistance    ___ Shower with assistance ___ No alcohol     ___ Return to work/school ________   COMMUNITY REFERRALS UPON DISCHARGE:    Home Health:   PT     OT     RN                     Agency: [facility] Phone: [phone]   Medical Equipment/Items Ordered: walker                                                    Agency/Supplier: [facility]      Special Instructions: Continue intravenous FLOLAN as prior to admission   My questions have been answered and I understand these instructions. I will adhere to these goals and the provided educational materials after my discharge from the hospital.  Patient/Caregiver Signature _______________________________ Date __________  Clinician Signature _______________________________________ Date __________  Please bring this form and your medication list with you to all your follow-up doctor's appointments.

## 2014-08-17 NOTE — Progress Notes (Signed)
Occupational Therapy Discharge Summary  Patient Details  Name: Meagan [name] MRN: [id number] Date of Birth: Apr 15, 1946  Today's Date: 08/17/2014  Patient has met 10 of 10 long term goals due to improved activity tolerance, improved balance, ability to compensate for deficits, improved awareness and improved coordination.  Patient to discharge at overall Supervision level.  Patient's care partner is independent to provide the necessary physical and cognitive assistance at discharge.    Reasons goals not met: N/A  Recommendation:  Patient will benefit from ongoing skilled OT services in home health setting to continue to advance functional skills in the area of BADL and iADL.  Equipment: No equipment provided. Pt has all needed equipment at home.  Reasons for discharge: treatment goals met and discharge from hospital  Patient/family agrees with progress made and goals achieved: Yes  OT Discharge  Precautions/Restrictions  Precautions Precautions: Anterior Hip;Fall Precaution Comments: 6L O2 (used this at home); and medication pump Restrictions Weight Bearing Restrictions: Yes RUE Weight Bearing: Weight bear through elbow only  Pain Pain Assessment Pain Assessment: 0-10 Pain Score: 9  Pain Type: Surgical pain Pain Location: Hip Pain Orientation: Right Pain Descriptors / Indicators: Aching Pain Frequency: Constant Pain Onset: On-going Patients Stated Pain Goal: 2 Pain Intervention(s): Medication (See eMAR);Repositioned  ADL See FIM  Vision/Perception  Vision- History Baseline Vision/History: Wears glasses Wears Glasses: Reading only Patient Visual Report: No change from baseline Vision- Assessment Vision Assessment?: No apparent visual deficits   Cognition Overall Cognitive Status: Within Functional Limits for tasks assessed Orientation Level: Oriented X4  Sensation Coordination Gross Motor Movements are Fluid and Coordinated: Yes Fine Motor Movements are  Fluid and Coordinated: Yes   Mobility  See PT note  Trunk/Postural Assessment  See PT note  Balance See PT note  Extremity/Trunk Assessment RUE Assessment RUE Assessment: Within Functional Limits LUE Assessment LUE Assessment: Within Functional Limits  See FIM for current functional status  [name] Raynell 08/17/2014, 8:49 AM

## 2014-08-17 NOTE — Progress Notes (Signed)
Patient reports having nausea, provided 5mg  of compazine and crackers.  Will continue to monitor. Oval Linsey, RN

## 2014-08-17 NOTE — Progress Notes (Signed)
Physical Therapy Discharge Summary  Patient Details  Name: Meagan [name] MRN: [id number] Date of Birth: Jun 26, 1946  Today's Date: 08/17/2014 PT Individual Time: 0900-1000 PT Individual Time Calculation (min): 60 min    Patient has met 10 of 10 long term goals due to improved activity tolerance, improved balance, improved postural control, increased strength, decreased pain and functional use of  right lower extremity.  Patient to discharge at an ambulatory level Supervision.   Patient's care partner is independent to provide the necessary physical and cognitive assistance at discharge.  Reasons goals not met: n/a  Recommendation:  Patient will benefit from ongoing skilled PT services in home health setting to continue to advance safe functional mobility, address ongoing impairments in decreased balance, decreased memory, decreased strength, decreased endurance, and minimize fall risk.  Equipment: RW  Reasons for discharge: treatment goals met and discharge from hospital  Patient/family agrees with progress made and goals achieved: Yes  PT Treatment/Intervention: Pt received sitting in w/c in room, agreeable to therapy.  Skilled session focused on addressing all goals and FIM levels for grad day.  Performed all gait throughout session up to 50' in controlled and home environment at S level with RW.  Also performed bed mobility in ADL apt at S level with min cues for safety.  Performed car transfer at S level with cues for sequencing/technique with RW when turning.  Also performed stairs, up/down 2, 6" steps with RW to simulate home entry.  Husband assisted at min A level with good communication between them during task.  Assessed strength, sensation, etc, see full details below.  Ambulated another 9' back towards room and assisted her remainder of distance to room and left in w/c with all needs in reach.    Remained on 6L O2 throughout session.   PT  Discharge Precautions/Restrictions Precautions Precautions: Anterior Hip;Fall Precaution Booklet Issued: No Precaution Comments: 6L O2 (used this at home); and medication pump Restrictions Weight Bearing Restrictions: Yes RUE Weight Bearing: Weight bear through elbow only RLE Weight Bearing: Weight bearing as tolerated LLE Weight Bearing: Weight bearing as tolerated Vital Signs Therapy Vitals Pulse Rate: 89 BP: 123/80 mmHg Pain Pain Assessment Pain Assessment: 0-10 Pain Score: 5  Pain Type: Acute pain;Surgical pain Pain Location: Hip Pain Orientation: Right Pain Descriptors / Indicators: Aching Pain Intervention(s): RN made aware Vision/Perception     Cognition Overall Cognitive Status: Within Functional Limits for tasks assessed Arousal/Alertness: Awake/alert Orientation Level: Oriented X4 Attention: Focused;Sustained Focused Attention: Appears intact Sustained Attention: Appears intact Memory: Impaired Memory Impairment: Decreased recall of new information Awareness: Appears intact Problem Solving: Impaired Problem Solving Impairment: Functional complex Safety/Judgment: Appears intact Comments: Pt very cautious but has decreased memory and recall of tasks during session.  Sensation Sensation Light Touch: Appears Intact (RLE/LLE) Stereognosis: Not tested Hot/Cold: Not tested Proprioception: Appears Intact Coordination Gross Motor Movements are Fluid and Coordinated: Yes Fine Motor Movements are Fluid and Coordinated: Yes Heel Shin Test: delayed due to decreased strength and increased pain Motor  Motor Motor: Other (comment) Motor - Discharge Observations: Pt with decreased balance and forward head/rounded shoulders  Mobility Bed Mobility Bed Mobility: Supine to Sit;Sit to Supine Supine to Sit: 5: Supervision Supine to Sit Details: Verbal cues for sequencing;Verbal cues for technique;Verbal cues for precautions/safety Sit to Supine: 5: Supervision Sit to  Supine - Details: Verbal cues for sequencing;Verbal cues for technique;Verbal cues for precautions/safety Transfers Transfers: Yes Sit to Stand: 5: Supervision Sit to Stand Details: Verbal cues for sequencing;Verbal cues  for technique;Verbal cues for precautions/safety Stand to Sit: 5: Supervision Stand to Sit Details (indicate cue type and reason): Verbal cues for sequencing;Verbal cues for technique;Verbal cues for precautions/safety Stand Pivot Transfers: 5: Supervision Stand Pivot Transfer Details: Verbal cues for sequencing;Verbal cues for technique;Verbal cues for precautions/safety Locomotion  Ambulation Ambulation: Yes Ambulation/Gait Assistance: 5: Supervision Ambulation Distance (Feet): 50 Feet Assistive device: Rolling walker Ambulation/Gait Assistance Details: Verbal cues for sequencing;Verbal cues for technique;Verbal cues for precautions/safety Gait Gait: Yes Gait Pattern: Impaired Gait Pattern: Antalgic;Lateral hip instability;Trunk flexed;Wide base of support;Decreased stride length Stairs / Additional Locomotion Stairs: Yes Stairs Assistance: 4: Min assist Stairs Assistance Details: Verbal cues for sequencing;Verbal cues for technique;Verbal cues for precautions/safety;Manual facilitation for weight shifting Stair Management Technique: Backwards;With walker;Step to pattern Number of Stairs: 2 Height of Stairs: 6 Wheelchair Mobility Wheelchair Mobility: Yes Wheelchair Assistance: 5: Investment banker, operational Details: Verbal cues for sequencing;Verbal cues for Marketing executive: Both upper extremities Wheelchair Parts Management: Needs assistance Distance: 100  Trunk/Postural Assessment  Cervical Assessment Cervical Assessment: Exceptions to [facility] Cervical AROM Overall Cervical AROM Comments: limited extension due to chronic down head posture Thoracic Assessment Thoracic Assessment: Exceptions to [facility] Thoracic AROM Overall Thoracic AROM:  Deficits;Due to premorid status Overall Thoracic AROM Comments: flexible kyphosis due to chronic slouched positioning Postural Control Postural Control: Deficits on evaluation Head Control: head down Trunk Control: kyphosis Postural Limitations: chronic sacral/slouched sitting with head down  Balance Balance Balance Assessed: Yes Static Sitting Balance Static Sitting - Balance Support: Right upper extremity supported;Feet supported Static Sitting - Level of Assistance: 6: Modified independent (Device/Increase time) Dynamic Sitting Balance Dynamic Sitting - Balance Support: Bilateral upper extremity supported;Feet supported;During functional activity Dynamic Sitting - Level of Assistance: 5: Stand by assistance Dynamic Sitting - Balance Activities: Lateral lean/weight shifting;Forward lean/weight shifting Static Standing Balance Static Standing - Balance Support: Bilateral upper extremity supported;During functional activity Static Standing - Level of Assistance: 5: Stand by assistance Dynamic Standing Balance Dynamic Standing - Balance Support: Bilateral upper extremity supported;During functional activity Dynamic Standing - Level of Assistance: 5: Stand by assistance Dynamic Standing - Balance Activities: Lateral lean/weight shifting Extremity Assessment  RUE Assessment RUE Assessment: Within Functional Limits LUE Assessment LUE Assessment: Within Functional Limits RLE Assessment RLE Assessment: Exceptions to [facility] RLE Strength RLE Overall Strength: Deficits;Due to pain;Due to precautions RLE Overall Strength Comments: hip flex 3+/5, knee flex/ext 4/5, ankle DF 3+/5, PF 4/5 LLE Assessment LLE Assessment: Within Functional Limits  See FIM for current functional status  Denice Bors 08/17/2014, 12:15 PM

## 2014-08-17 NOTE — Progress Notes (Signed)
Note reviewed and accurately reflects treatment session.   

## 2014-08-17 NOTE — Progress Notes (Signed)
68 y.o. right-handed female with history of systolic congestive heart failure, severe PAH(pulmonary arterial hypertension) maintained on continuous flofan infusion, fibromyalgia, COPD with chronic oxygen of 6 L, CAD with CABG and diabetes mellitus with peripheral neuropathy. Independent prior to admission living with her husband and used a walker outside of the home. Presented 07/27/2014 after mechanical fall. Denies loss of consciousness or associated chest pain or shortness or breath associated with fall. X-rays and imaging revealed a right hip displaced and angulated femoral neck fracture. Cardiology followup preoperatively and cleared for surgery. Underwent right total hip arthroplasty 07/29/2014 per Dr. Ninfa Linden. Weightbearing as tolerated with anterior hip precautions. Hospital course pain management. Subcutaneous Lovenox for DVT prophylaxis. Acute blood loss anemia and transfuse   Subjective/Complaints: Feeling much better.   Review of Systems -. No oversedation  Objective: Vital Signs: Blood pressure 115/85, pulse 92, temperature 98.1 F (36.7 C), temperature source Oral, resp. rate 16, height 5' (1.524 m), weight 64 kg (141 lb 1.5 oz), SpO2 99.00%. No results found. Results for orders placed during the hospital encounter of 08/03/14 (from the past 72 hour(s))  GLUCOSE, CAPILLARY     Status: Abnormal   Collection Time    08/14/14 11:36 AM      Result Value Ref Range   Glucose-Capillary 151 (*) 70 - 99 mg/dL  GLUCOSE, CAPILLARY     Status: Abnormal   Collection Time    08/14/14  4:46 PM      Result Value Ref Range   Glucose-Capillary 136 (*) 70 - 99 mg/dL  GLUCOSE, CAPILLARY     Status: Abnormal   Collection Time    08/14/14  9:16 PM      Result Value Ref Range   Glucose-Capillary 181 (*) 70 - 99 mg/dL  GLUCOSE, CAPILLARY     Status: Abnormal   Collection Time    08/15/14  7:08 AM      Result Value Ref Range   Glucose-Capillary 174 (*) 70 - 99 mg/dL  GLUCOSE, CAPILLARY      Status: Abnormal   Collection Time    08/15/14 11:37 AM      Result Value Ref Range   Glucose-Capillary 172 (*) 70 - 99 mg/dL   Comment 1 Notify RN    GLUCOSE, CAPILLARY     Status: Abnormal   Collection Time    08/15/14  4:54 PM      Result Value Ref Range   Glucose-Capillary 136 (*) 70 - 99 mg/dL   Comment 1 Notify RN    GLUCOSE, CAPILLARY     Status: Abnormal   Collection Time    08/15/14  9:28 PM      Result Value Ref Range   Glucose-Capillary 160 (*) 70 - 99 mg/dL  GLUCOSE, CAPILLARY     Status: Abnormal   Collection Time    08/16/14  7:36 AM      Result Value Ref Range   Glucose-Capillary 197 (*) 70 - 99 mg/dL  CBC     Status: Abnormal   Collection Time    08/16/14  8:16 AM      Result Value Ref Range   WBC 12.1 (*) 4.0 - 10.5 K/uL   RBC 3.34 (*) 3.87 - 5.11 MIL/uL   Hemoglobin 9.5 (*) 12.0 - 15.0 g/dL   HCT 28.9 (*) 36.0 - 46.0 %   MCV 86.5  78.0 - 100.0 fL   MCH 28.4  26.0 - 34.0 pg   MCHC 32.9  30.0 - 36.0 g/dL  RDW 14.5  11.5 - 15.5 %   Platelets 437 (*) 150 - 400 K/uL  COMPREHENSIVE METABOLIC PANEL     Status: Abnormal   Collection Time    08/16/14  8:16 AM      Result Value Ref Range   Sodium 129 (*) 137 - 147 mEq/L   Potassium 5.1  3.7 - 5.3 mEq/L   Comment: HEMOLYSIS AT THIS LEVEL MAY AFFECT RESULT   Chloride 90 (*) 96 - 112 mEq/L   CO2 24  19 - 32 mEq/L   Glucose, Bld 203 (*) 70 - 99 mg/dL   BUN 17  6 - 23 mg/dL   Creatinine, Ser 0.85  0.50 - 1.10 mg/dL   Calcium 9.2  8.4 - 10.5 mg/dL   Total Protein 6.8  6.0 - 8.3 g/dL   Albumin 2.9 (*) 3.5 - 5.2 g/dL   AST 16  0 - 37 U/L   Comment: HEMOLYSIS AT THIS LEVEL MAY AFFECT RESULT   ALT 7  0 - 35 U/L   Comment: HEMOLYSIS AT THIS LEVEL MAY AFFECT RESULT   Alkaline Phosphatase 108  39 - 117 U/L   Total Bilirubin 0.4  0.3 - 1.2 mg/dL   GFR calc non Af Amer 68 (*) >90 mL/min   GFR calc Af Amer 79 (*) >90 mL/min   Comment: (NOTE)     The eGFR has been calculated using the CKD EPI equation.     This  calculation has not been validated in all clinical situations.     eGFR's persistently <90 mL/min signify possible Chronic Kidney     Disease.   Anion gap 15  5 - 15  GLUCOSE, CAPILLARY     Status: Abnormal   Collection Time    08/16/14 11:22 AM      Result Value Ref Range   Glucose-Capillary 163 (*) 70 - 99 mg/dL  GLUCOSE, CAPILLARY     Status: Abnormal   Collection Time    08/16/14  4:44 PM      Result Value Ref Range   Glucose-Capillary 179 (*) 70 - 99 mg/dL  GLUCOSE, CAPILLARY     Status: Abnormal   Collection Time    08/16/14  8:42 PM      Result Value Ref Range   Glucose-Capillary 135 (*) 70 - 99 mg/dL  GLUCOSE, CAPILLARY     Status: Abnormal   Collection Time    08/17/14  7:03 AM      Result Value Ref Range   Glucose-Capillary 173 (*) 70 - 99 mg/dL  BASIC METABOLIC PANEL     Status: Abnormal   Collection Time    08/17/14  7:32 AM      Result Value Ref Range   Sodium 136 (*) 137 - 147 mEq/L   Potassium 4.2  3.7 - 5.3 mEq/L   Chloride 95 (*) 96 - 112 mEq/L   CO2 28  19 - 32 mEq/L   Glucose, Bld 172 (*) 70 - 99 mg/dL   BUN 14  6 - 23 mg/dL   Creatinine, Ser 1.01  0.50 - 1.10 mg/dL   Calcium 9.3  8.4 - 10.5 mg/dL   GFR calc non Af Amer 55 (*) >90 mL/min   GFR calc Af Amer 64 (*) >90 mL/min   Comment: (NOTE)     The eGFR has been calculated using the CKD EPI equation.     This calculation has not been validated in all clinical situations.     eGFR's  persistently <90 mL/min signify possible Chronic Kidney     Disease.   Anion gap 13  5 - 15      Constitutional: She is oriented to person, place, and time. She appears well-developed.  HENT: oral mucosa moist, dentition fair  Head: Normocephalic.  Eyes: EOM are normal.  Neck: Normal range of motion. Neck supple. No thyromegaly present.  Cardiovascular: Normal rate and regular rhythm.  Respiratory: Breath sounds normal. No respiratory distress.  GI: Soft. Bowel sounds are normal. She exhibits no distension. No pain  with palpation Neurological: She is alert and oriented to person, place, and time. Fair insight and awareness.  Follows three-step commands. HOH sometimes limiting. UES: 4+ to 5/5. JJO:ACZYSAY due to pain, 1/5 hf, 1+ KE and 4- ADF/APF. LLE grossly 3+ to 4+/5 prox to distal. No gross sensory findings.  Skin: incision clean, some dry blood/clot proximally along incision  M/S: right hip with mild edema. Area remains very sensitive to touch.  Psychiatric:  In good spirits.    Assessment/Plan: 1. Functional deficits secondary to Right Femoral neck fracture which require 3+ hours per day of interdisciplinary therapy in a comprehensive inpatient rehab setting. Physiatrist is providing close team supervision and 24 hour management of active medical problems listed below. Physiatrist and rehab team continue to assess barriers to discharge/monitor patient progress toward functional and medical goals.   Dc home today.   FIM: FIM - Bathing Bathing Steps Patient Completed: Chest;Right Arm;Left Arm;Abdomen;Front perineal area;Right lower leg (including foot);Left lower leg (including foot);Buttocks;Right upper leg;Left upper leg Bathing: 4: Steadying assist  FIM - Upper Body Dressing/Undressing Upper body dressing/undressing steps patient completed: Thread/unthread left sleeve of pullover shirt/dress;Thread/unthread right sleeve of pullover shirt/dresss;Put head through opening of pull over shirt/dress;Pull shirt over trunk Upper body dressing/undressing: 5: Set-up assist to: Obtain clothing/put away FIM - Lower Body Dressing/Undressing Lower body dressing/undressing steps patient completed: Thread/unthread right underwear leg;Thread/unthread left underwear leg;Pull underwear up/down;Thread/unthread left pants leg;Don/Doff left sock;Don/Doff right sock;Pull pants up/down;Thread/unthread right pants leg Lower body dressing/undressing: 4: Steadying Assist  FIM - Toileting Toileting steps completed by  patient: Adjust clothing prior to toileting;Performs perineal hygiene;Adjust clothing after toileting Toileting Assistive Devices: Grab bar or rail for support Toileting: 4: Steadying assist  FIM - Radio producer Devices: Mining engineer Transfers: 4-To toilet/BSC: Min A (steadying Pt. > 75%);4-From toilet/BSC: Min A (steadying Pt. > 75%)  FIM - Control and instrumentation engineer Devices: Walker;Arm rests;Bed rails Bed/Chair Transfer: 5: Supine > Sit: Supervision (verbal cues/safety issues);5: Bed > Chair or W/C: Supervision (verbal cues/safety issues)  FIM - Locomotion: Wheelchair Distance: 100 Locomotion: Wheelchair: 2: Travels 50 - 149 ft with supervision, cueing or coaxing FIM - Locomotion: Ambulation Locomotion: Ambulation Assistive Devices: Administrator Ambulation/Gait Assistance: 5: Supervision Locomotion: Ambulation: 0: Activity did not occur  Comprehension Comprehension Mode: Auditory Comprehension: 5-Follows basic conversation/direction: With no assist  Expression Expression Mode: Verbal Expression: 5-Expresses basic needs/ideas: With extra time/assistive device  Social Interaction Social Interaction: 6-Interacts appropriately with others with medication or extra time (anti-anxiety, antidepressant).  Problem Solving Problem Solving: 5-Solves complex 90% of the time/cues < 10% of the time  Memory Memory: 6-More than reasonable amt of time  Medical Problem List and Plan:  1. Functional deficits secondary to displaced right femoral neck fracture. 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 Weightbearing as tolerated with anterior hip precautions  2. DVT Prophylaxis/Anticoagulation: Subcutaneous Lovenox.  3. Pain Management: on scheduled dosing q12 and prn dilaudid q6 prn  -  pain control better   -Cymbalta for Fibromyalgia  4. Mood/anxiety/depression: Xanax 1 mg each bedtime. Remains a major issue 5. Neuropsych:  This patient is capable of making decisions on her own behalf.  6. Skin/Wound Care: Routine skin checks   -removed staples   7. Fluids/Electrolytes/Nutrition: Followup chemistries was strict I and Os. Provide nutritional supplements as needed  8. Severe pulmonary arterial hypertension. Continue Flolan per cardiology services as well as reduce  Toprol-XL 50 mg daily,HR in usual range thus far  restart Imdur 90 mg daily,Cont  Lasix 80 mg daily.   -appreciated cards follow up 9. Diabetes mellitus with peripheral neuropathy. Latest hemoglobin A1c 6.3. Lantus insulin 8 units each bedtime.  -fair control at [employer]. SSI for spikes 10. Hypothyroidism. Continue Synthroid  11. Hyperlipidemia.Vytorin  12. Recurrent Clostridium difficile. Contact precautions. Vancomycin 125 mg 4 times a day x14 days initiated 08/01/2014   -nausea improved  -has hx of UGI bleed but cbc stable  -continue pepcid  -probiotic 13. COPD/chronic respiratory failure. Oxygen therapy as directed  14.  UTI yeast--rxed with diflucan LOS (Days) 14 A FACE TO FACE EVALUATION WAS PERFORMED  SWARTZ,ZACHARY T 08/17/2014, 8:36 AM

## 2014-08-17 NOTE — Progress Notes (Signed)
Occupational Therapy Session Note  Patient Details  Name: Meagan [name] MRN: [id number] Date of Birth: Oct 18, 1946  Today's Date: 08/17/2014 OT Individual Time: 2072-1828 OT Individual Calculated Time (min): 60 min  Short Term Goals: Week 1:  OT Short Term Goal 1 (Week 1): Pt will complete UB/LB dressing using AE prn w/ min A. OT Short Term Goal 1 - Progress (Week 1): Met OT Short Term Goal 2 (Week 1): Pt will complete UB/LB bathing using AE prn w/ min A. OT Short Term Goal 2 - Progress (Week 1): Met OT Short Term Goal 3 (Week 1): Pt will complete toilet t/f onto 3-in-1 commode w/ min A. OT Short Term Goal 3 - Progress (Week 1): Met OT Short Term Goal 4 (Week 1): Pt will be educated on UE strengthening and ROM HEP. OT Short Term Goal 4 - Progress (Week 1): Met Week 2:  OT Short Term Goal 1 (Week 2): STG=LTG due to length of stay  Skilled Therapeutic Interventions/Progress Updates:  Skilled OT session focusing on ADL retraining, dynamic standing balance, functional ambulation, UE strength and ROM, and activity tolerance/endurance. Pt seated in w/c w/ husband in room when arriving, reporting 4/10 pain in R hip. Husband provided A during ADL session, w/ therapist providing supervision and education on safe A when needed. Pt ambulated to sink w/ RW and performed UB/LB B&D w/ 4 sit<>stand t/f and using reacher, sock-aid, and shoe horn. Pt completed tooth brushing and hair brushing while standing at sink. Pt requiring min v/c for maintaining hip precautions when donning shoes from husband. Pt self-propelled w/c to bedside and completed 5/5 UE strength and ROM exercises that are part of her HEP. Husband demonstrating proper techniques for completing exercises to pt when needed. Husband demonstrating safe physical A and v/c's when needed throughout entire session. Pt seated in w/c and set-up w/ breakfast and all needs nearby when leaving.  Therapy Documentation Precautions:   Precautions Precautions: Anterior Hip;Fall Precaution Booklet Issued: No Precaution Comments: 6L O2 (used this at home); and medication pump Restrictions Weight Bearing Restrictions: Yes RUE Weight Bearing: Weight bear through elbow only LUE Weight Bearing: Weight bearing as tolerated RLE Weight Bearing: Weight bearing as tolerated LLE Weight Bearing: Weight bearing as tolerated  See FIM for current functional status  Therapy/Group: Individual Therapy  Jaylynne Birkhead Raynell 08/17/2014, 8:57 AM

## 2014-09-02 DIAGNOSIS — I5032 Chronic diastolic (congestive) heart failure: Secondary | ICD-10-CM

## 2014-09-02 DIAGNOSIS — S72001S Fracture of unspecified part of neck of right femur, sequela: Secondary | ICD-10-CM

## 2014-09-02 DIAGNOSIS — S72001A Fracture of unspecified part of neck of right femur, initial encounter for closed fracture: Secondary | ICD-10-CM

## 2014-09-02 DIAGNOSIS — I272 Other secondary pulmonary hypertension: Secondary | ICD-10-CM

## 2014-09-02 DIAGNOSIS — A047 Enterocolitis due to Clostridium difficile: Secondary | ICD-10-CM

## 2014-09-02 DIAGNOSIS — N183 Chronic kidney disease, stage 3 (moderate): Secondary | ICD-10-CM

## 2014-09-02 DIAGNOSIS — S72001D Fracture of unspecified part of neck of right femur, subsequent encounter for closed fracture with routine healing: Secondary | ICD-10-CM

## 2014-09-02 DIAGNOSIS — M797 Fibromyalgia: Secondary | ICD-10-CM

## 2014-09-02 DIAGNOSIS — E1165 Type 2 diabetes mellitus with hyperglycemia: Secondary | ICD-10-CM

## 2014-09-03 NOTE — Op Note (Signed)
NAMESYBELLA, HARNISH NO.:  [id number]  MEDICAL RECORD NO.:  [id number]  LOCATION:  [id number]                        FACILITY:  [facility]  PHYSICIAN:  Lind Guest. Ninfa Linden, M.D.DATE OF BIRTH:  Feb 16, 1946  DATE OF PROCEDURE:  07/29/2014 DATE OF DISCHARGE:  08/03/2014                              OPERATIVE REPORT   ADDENDUM  PREOPERATIVE DIAGNOSIS:  Displaced right hip femoral neck fracture.  POSTOPERATIVE DIAGNOSIS:  Displaced right hip femoral neck fracture.  PROCEDURE:  Right hip hemiarthroplasty and not a total hip. __________ the right hip.     Lind Guest. Ninfa Linden, M.D.     CYB/MEDQ  D:  09/02/2014  T:  09/03/2014  Job:  [id number]

## 2014-09-07 ENCOUNTER — Other Ambulatory Visit (HOSPITAL_COMMUNITY): Payer: Self-pay | Admitting: Internal Medicine

## 2014-09-07 ENCOUNTER — Ambulatory Visit (HOSPITAL_COMMUNITY)
Admission: RE | Admit: 2014-09-07 | Discharge: 2014-09-07 | Disposition: A | Discharge status: Home / Self Care | Payer: Medicare Other | Source: Ambulatory Visit | Attending: Internal Medicine | Admitting: Internal Medicine

## 2014-09-07 DIAGNOSIS — J9811 Atelectasis: Secondary | ICD-10-CM | POA: Diagnosis not present

## 2014-09-07 DIAGNOSIS — R079 Chest pain, unspecified: Secondary | ICD-10-CM | POA: Diagnosis present

## 2014-09-07 DIAGNOSIS — K219 Gastro-esophageal reflux disease without esophagitis: Secondary | ICD-10-CM | POA: Insufficient documentation

## 2014-09-07 DIAGNOSIS — R05 Cough: Secondary | ICD-10-CM | POA: Diagnosis not present

## 2014-09-07 DIAGNOSIS — Z951 Presence of aortocoronary bypass graft: Secondary | ICD-10-CM | POA: Insufficient documentation

## 2014-09-07 DIAGNOSIS — E119 Type 2 diabetes mellitus without complications: Secondary | ICD-10-CM | POA: Insufficient documentation

## 2014-09-07 DIAGNOSIS — I10 Essential (primary) hypertension: Secondary | ICD-10-CM | POA: Insufficient documentation

## 2014-09-07 DIAGNOSIS — R0602 Shortness of breath: Secondary | ICD-10-CM | POA: Diagnosis not present

## 2014-09-07 DIAGNOSIS — I5032 Chronic diastolic (congestive) heart failure: Secondary | ICD-10-CM | POA: Diagnosis not present

## 2014-09-07 DIAGNOSIS — N179 Acute kidney failure, unspecified: Secondary | ICD-10-CM | POA: Insufficient documentation

## 2014-09-07 DIAGNOSIS — R059 Cough, unspecified: Secondary | ICD-10-CM

## 2014-09-30 ENCOUNTER — Encounter (HOSPITAL_COMMUNITY): Payer: Self-pay | Admitting: Internal Medicine

## 2014-10-20 ENCOUNTER — Ambulatory Visit (HOSPITAL_COMMUNITY)
Admission: RE | Admit: 2014-10-20 | Discharge: 2014-10-20 | Disposition: A | Discharge status: Home / Self Care | Payer: Medicare Other | Source: Ambulatory Visit | Attending: Cardiology | Admitting: Cardiology

## 2014-10-20 ENCOUNTER — Telehealth (HOSPITAL_COMMUNITY): Payer: Self-pay

## 2014-10-20 ENCOUNTER — Encounter (HOSPITAL_COMMUNITY): Payer: Self-pay

## 2014-10-20 ENCOUNTER — Other Ambulatory Visit (HOSPITAL_COMMUNITY): Payer: Self-pay

## 2014-10-20 VITALS — BP 103/53 | HR 73 | Resp 18 | Wt 145.5 lb

## 2014-10-20 DIAGNOSIS — K228 Other specified diseases of esophagus: Secondary | ICD-10-CM | POA: Diagnosis not present

## 2014-10-20 DIAGNOSIS — I2510 Atherosclerotic heart disease of native coronary artery without angina pectoris: Secondary | ICD-10-CM | POA: Diagnosis not present

## 2014-10-20 DIAGNOSIS — I10 Essential (primary) hypertension: Secondary | ICD-10-CM | POA: Diagnosis not present

## 2014-10-20 DIAGNOSIS — E669 Obesity, unspecified: Secondary | ICD-10-CM | POA: Diagnosis not present

## 2014-10-20 DIAGNOSIS — R0600 Dyspnea, unspecified: Secondary | ICD-10-CM | POA: Diagnosis not present

## 2014-10-20 DIAGNOSIS — K210 Gastro-esophageal reflux disease with esophagitis: Secondary | ICD-10-CM | POA: Insufficient documentation

## 2014-10-20 DIAGNOSIS — K8689 Other specified diseases of pancreas: Secondary | ICD-10-CM

## 2014-10-20 DIAGNOSIS — R0602 Shortness of breath: Secondary | ICD-10-CM

## 2014-10-20 DIAGNOSIS — Z79899 Other long term (current) drug therapy: Secondary | ICD-10-CM | POA: Insufficient documentation

## 2014-10-20 DIAGNOSIS — Z951 Presence of aortocoronary bypass graft: Secondary | ICD-10-CM | POA: Insufficient documentation

## 2014-10-20 DIAGNOSIS — Z9981 Dependence on supplemental oxygen: Secondary | ICD-10-CM | POA: Diagnosis not present

## 2014-10-20 DIAGNOSIS — M797 Fibromyalgia: Secondary | ICD-10-CM | POA: Diagnosis not present

## 2014-10-20 DIAGNOSIS — R6884 Jaw pain: Secondary | ICD-10-CM | POA: Insufficient documentation

## 2014-10-20 DIAGNOSIS — Z794 Long term (current) use of insulin: Secondary | ICD-10-CM | POA: Insufficient documentation

## 2014-10-20 DIAGNOSIS — E119 Type 2 diabetes mellitus without complications: Secondary | ICD-10-CM | POA: Insufficient documentation

## 2014-10-20 DIAGNOSIS — I5032 Chronic diastolic (congestive) heart failure: Secondary | ICD-10-CM | POA: Insufficient documentation

## 2014-10-20 DIAGNOSIS — I2721 Secondary pulmonary arterial hypertension: Secondary | ICD-10-CM

## 2014-10-20 DIAGNOSIS — I272 Other secondary pulmonary hypertension: Secondary | ICD-10-CM | POA: Diagnosis not present

## 2014-10-20 DIAGNOSIS — I5030 Unspecified diastolic (congestive) heart failure: Secondary | ICD-10-CM | POA: Diagnosis not present

## 2014-10-20 LAB — BASIC METABOLIC PANEL
ANION GAP: 7 (ref 5–15)
BUN: 19 mg/dL (ref 6–23)
CALCIUM: 9.1 mg/dL (ref 8.4–10.5)
CO2: 31 mmol/L (ref 19–32)
CREATININE: 0.9 mg/dL (ref 0.50–1.10)
Chloride: 99 mEq/L (ref 96–112)
GFR calc Af Amer: 74 mL/min — ABNORMAL LOW (ref >90)
GFR, EST NON AFRICAN AMERICAN: 64 mL/min — AB (ref >90)
GLUCOSE: 101 mg/dL — AB (ref 70–99)
Potassium: 4.6 mmol/L (ref 3.5–5.1)
Sodium: 137 mmol/L (ref 135–145)

## 2014-10-20 LAB — BRAIN NATRIURETIC PEPTIDE: B Natriuretic Peptide: 224.2 pg/mL — ABNORMAL HIGH (ref 0.0–100.0)

## 2014-10-20 MED ORDER — POTASSIUM CHLORIDE CRYS ER 10 MEQ PO TBCR: 40.0000 meq | EXTENDED_RELEASE_TABLET | Freq: Every day | ORAL | Status: AC

## 2014-10-20 MED ORDER — CLOPIDOGREL BISULFATE 75 MG PO TABS: 75.0000 mg | ORAL_TABLET | Freq: Every day | ORAL | Status: AC

## 2014-10-20 NOTE — Telephone Encounter (Signed)
Left VM for patient to let them know that we faxed over medical release form to [employer] to prevent future extended power outages.  Form scanned into patient's chart.  Renee Pain

## 2014-10-20 NOTE — Patient Instructions (Signed)
Will call with any abnormal lab results.  Follow up 4 months.  Happy New Year!  Do the following things EVERYDAY: 1) Weigh yourself in the morning before breakfast. Write it down and keep it in a log. 2) Take your medicines as prescribed 3) Eat low salt foods-Limit salt (sodium) to 2000 mg per day.  4) Stay as active as you can everyday 5) Limit all fluids for the day to less than 2 liters

## 2014-10-20 NOTE — Progress Notes (Signed)
Patient ID: RASHEDA LEDGER, female   DOB: Feb 11, 1946, 68 y.o.   MRN: [id number] GI: Dr Gala Romney Oncologist: Dr Tressie Stalker Cardiologsit: Dr Gilles Chiquito- [facility] IV Flolan PCP: Dr Gerarda Fraction  HPI: Ms. Para is a 68 y/o woman with multiple medical problems including CAD s/p CABG and RCA stent, DM2, obesity, fibromyalgia, diastolic HF. She has severe PAH and is on Flolan followed by Dr. Gilles Chiquito at [facility].   Admitted to [facility] 02/22/14 with jaw pain and dyspnea. Had RHC/LHC which showed decreased PVR and stable coronaries. Diuresed with IV lasix. Also treated for possible pneumonia. Discharge weight was 150 pounds.   She was admitted again in 8/15 with upper GI bleeding as well as C difficile diarrhea.  She had no chest pain but mildly elevated troponin suggestive of demand ischemia.  She had an EGD showing esophagitis and gastritis.  Plavix was held initially but she is back on it.  No further melena or BRBPR. Abdominal US showed gallstones in the gallbladder.    Admitted in [facility] in October after a fall. Treated for R hip fracture and discharged to inpatient rehab. Rehab stay complicated by C diff. Completed 14 day course of vancomycin. Discharged to home 08/16/14.   She returns for follow up. Complaining of jaw pain with exertion and when she is eating. Taking nitroglycerin a couple times a week and says it relieves jaw pain.  She says that the jaw pain is basically the same as it has been ever since she started on Flolan. She has significant exertional dyspnea: she is short of breath walking around her house but is able to get out nonetheless and do shopping.  She also says that there has been no change to her dyspnea pattern. Denies PND/Orthopnea. Denies presyncope/sycope. Weight at home 141-145 pounds. No bleeding problems. Hip pain getting better. Ambulated with rolling walker. Has follow up with Dr Gilles Chiquito in March 2015.  CT chest 10/10: No PE or ILD PFTs 10/10: Restrictive lung disease FEV1 1.01L (56%) FVC 1.2 (48%) DLCO  24% Rheum work-up which was negative. Sleep study 11/10: negative for OSA  RHC/LHC 01/21/13  RA = 11  RV = 82/0/15  PA = 78/18 (39)  PCW = 19  Fick cardiac output/index = 6.1/3.6  PVR = 3.3 Woods  FA sat = 93%  PA sat = 62%,64%  Ao Pressure: 106/39 (65)  LV Pressure: 124/5/16 Left main: Heavily calcified. Distal 40-50%  LAD: Diffuse severe disease. Totalled in midsection. Mid to distal vessel fills from LIMA. 2 diagonals with diffuse disease. There is a 70-80% lesion in the midsection of the 2nd diagonal  LCX: Small heavily diseased vessel. 30-40% ostial. Long 95% lesion in mid AV groove after take-off of OM-1. Distal vessel small and diffusely diseased. OM-1 mild plaque. OM-2 and OM-3 occluded  RCA: Ostial spasm. Long stent in midsection is patent. Distal RCA 40% before PDA. Mild to moderate plaque in PD and PLs.  LV-gram done in the RAO projection: Ejection fraction = 60-65% No wall motion abnormalities  LIMA-LAD: widely patent with 50-60% lesion in LAD after insertion of LIMA  SVG-OM: totally occluded proximally (chronic)   RHC/LHC 02/23/14  RA = 9  RV = 67/5/10  PA = 71/21 (39)  PCW = 13  Fick cardiac output/index = 5.5/3.1  PVR = 4.6 Woods  FA sat = 94%  PA sat = 63%,64%  Ao Pressure: 177/76 (119)  LV Pressure: 169/9/20  Left main: Heavily calcified. Distal 40-50%  LAD: Diffuse severe disease. Totaled in  midsection. Mid to distal vessel fills from LIMA. 2 diagonals with diffuse disease. There is a 70-80% lesion in the midsection of the 2nd diagonal  LCX: Small heavily diseased vessel. 30-40% ostial. Long 95% lesion in mid AV groove after take-off of OM-1. Distal vessel small and diffusely diseased. OM-1 mild plaque. OM-2 and OM-3 occluded  RCA: Ostial spasm. Long stent in midsection is patent. Distal RCA 40% before PDA. In Mid PDA50% lesion. Mild to moderate plaque in PLs. R to L collaterals filling portion of LCX and septals  LIMA-LAD: widely patent with 70-75% eccentric  lesion in LAD after insertion of LIMA. The apical LAD is occluded.  SVG-OM: totally occluded proximally (chronic)   Echo 06/30/13: EF 60% RV moderately dilated. Evidence cor pulmonale RVSP 54  Labs (8/15): K 3.2, creatinine 1.48, HCT 33  ROS: All systems negative except as listed in HPI, PMH and Problem List.  Past Medical History  Diagnosis Date  . Fibromyalgia   . Hypercholesterolemia   . Hypertension     pulmonary  pap 110/31 (mean 62)by RHC 9/10 negative PE by chest CT 2010  . Pulmonary edema     Failed Revatio   . ARF (acute renal failure)     poor candidate for ACE -I or ARB  . Restrictive lung disease     obesity hypoventilation syndrome  . Renal insufficiency   . CAD (coronary artery disease)     multivessel DES x 2 RCA 2007  . Chronic diastolic heart failure   . MRSA (methicillin resistant staphylococcus aureus) pneumonia   . Pulmonary hypertension   . Chronic anemia     anemia of chronic disease based on anemia panel 2011  . Pancreatic mass 08/01/2012  . PONV (postoperative nausea and vomiting)   . Heart murmur     "when I was small"  . CHF (congestive heart failure)   . Anginal pain   . Pneumonia X 3  . Type II diabetes mellitus   . Hypothyroidism   . H/O hiatal hernia   . GERD (gastroesophageal reflux disease)   . Anxiety   . On home oxygen therapy     "6L all the time" (02/24/2014    Current Outpatient Prescriptions  Medication Sig Dispense Refill  . ALPRAZolam (XANAX) 0.5 MG tablet Take 2 tablets (1 mg total) by mouth at bedtime. For sleep. *Prescribed one tablet three times daily as needed* 30 tablet 0  . diclofenac sodium (VOLTAREN) 1 % GEL Apply 2 g topically 4 (four) times daily. 1 Tube 1  . DULoxetine (CYMBALTA) 30 MG capsule Take 1 capsule (30 mg total) by mouth at bedtime. 30 capsule 3  . DULoxetine (CYMBALTA) 60 MG capsule Take 1 capsule (60 mg total) by mouth every morning. 30 capsule 3  . Epoprostenol Sodium (FLOLAN IV) Inject into the vein. 1.5  mg. Inject into the vein continuously. Infuse mg/kg/min via cont IV pump.    Marland Kitchen ezetimibe-simvastatin (VYTORIN) 10-40 MG per tablet Take 1 tablet by mouth every evening. 30 tablet 1  . furosemide (LASIX) 40 MG tablet Take 2 tablets (80 mg total) by mouth daily. 30 tablet 1  . HYDROmorphone (DILAUDID) 2 MG tablet Take 2 tablets (4 mg total) by mouth every 6 (six) hours as needed for severe pain. 30 tablet 0  . insulin glargine (LANTUS) 100 UNIT/ML injection Inject 0.08 mLs (8 Units total) into the skin at bedtime. 10 mL 11  . isosorbide mononitrate (IMDUR) 60 MG 24 hr tablet Take 1.5 tablets (  90 mg total) by mouth every morning. 30 tablet 1  . levothyroxine (SYNTHROID, LEVOTHROID) 25 MCG tablet Take 1 tablet (25 mcg total) by mouth every morning. 30 tablet 1  . metoprolol succinate (TOPROL-XL) 50 MG 24 hr tablet Take 1 tablet (50 mg total) by mouth daily. Take with or immediately following a meal. (Patient taking differently: Take 25 mg by mouth daily. Take with or immediately following a meal.) 30 tablet 1  . nitroGLYCERIN (NITROSTAT) 0.4 MG SL tablet Place 0.4 mg under the tongue every 5 (five) minutes as needed for chest pain.     Marland Kitchen omeprazole (PRILOSEC) 20 MG capsule Take 1 capsule (20 mg total) by mouth 2 (two) times daily. 60 capsule 1  . ondansetron (ZOFRAN) 8 MG tablet Take 8 mg by mouth 2 (two) times daily. For nausea    . Oxygen Permeable Lens Products SOLN Inhale into the lungs continuous. 6 liters    . Oxymetazoline HCl (NASAL SPRAY) 0.05 % SOLN Place 1 spray into the nose as needed (for congestion).    . potassium chloride SA (K-DUR,KLOR-CON) 20 MEQ tablet Take 2 tablets (40 mEq total) by mouth daily. 30 tablet 1  . Probiotic Product (FLORAJEN3 PO) Take 1 tablet by mouth daily.    . prochlorperazine (COMPAZINE) 5 MG tablet Take 1 tablet (5 mg total) by mouth every 6 (six) hours as needed for nausea or vomiting. 30 tablet 0  . saccharomyces boulardii (FLORASTOR) 250 MG capsule Take 1  capsule (250 mg total) by mouth 2 (two) times daily. 60 capsule 0  . [DISCONTINUED] cetirizine (ZYRTEC) 10 MG tablet Take 10 mg by mouth daily.       No current facility-administered medications for this encounter.    Filed Vitals:   10/20/14 1343  BP: 103/53  Pulse: 73  Resp: 18  Weight: 145 lb 8 oz (65.998 kg)  SpO2: 97%   PHYSICAL EXAM: General: No distress. Wearing 6L [place] O2 husband and sister present  HEENT: normal  Neck: thick . JVP 8 cm Carotids 2+ bilat; no bruits. No lymphadenopathy or thryomegaly appreciated.  Cor: PMI nonpalpable Regular rate & rhythm. 2/6 TR. P2 perhaps mildly accentuated but not severely so  Hickman catheter ok Lungs: clear but decreased throughout no wheezing on 6 liters [place]  Abdomen: obese. soft, nontender, nondistended. No hepatosplenomegaly. No bruits or masses. Good bowel sounds.  Extremities: no cyanosis, clubbing, macular-papular erythematous rash on LEs, no edema  Neuro: alert & oriented x 3, cranial nerves grossly intact. moves all 4 extremities w/o difficulty. Affect pleasant.   EKG: NSR 74 bpm RBBB  ASSESSMENT/PLAN:   1. PAH: On Flolan through [place]. She has ongoing jaw pain in a pattern that has been stable since starting Flolan.  She has NYHA class III symptoms (exertional dyspnea) that also seem to be stable.   - Continue Flolan per Dr Gilles Chiquito - Continue home oxygen.  - Continue current Lasix with BMET/BNP today.  2. CAD: s/p CABG.  Last cath earlier this year, medically managed CAD.  Jaw pain can occur with exertion but this is a chronic pattern, no recent change. - Continue  Vytorin.  - She is back on Plavix after GI bleeding episode.  She had gastritis/esophagitis on EGD and is on PPI.  3. Chronic diastolic HF: Primarily RV failure in setting of PAH.  Continue Lasix 80 mg daily. Check BNP  4. Upper GI bleed: From esophagitis/gastritis.  As above, was started back on Plavix.  Continue for now unless  there is a significant rebleed.  5. R  hip fracture: discharged 08/17/14 from CIR 6. Recurrent C Diff: Completed 14 day course of vancomycin in October 2015.   Follow up 4 months   CLEGG,AMY NP-C  10/20/2014   Patient seen with NP, agree with the above note.  She has significant symptoms: jaw pain, exertional dyspnea.  However, her symptoms appear stable long-term with no recent change.  I am not going to change her regimen today.  I will get BMET/BNP.  She has followup at [facility] in 3/15, her Flolan is managed there.   Loralie Champagne 10/20/2014

## 2014-10-21 NOTE — Addendum Note (Signed)
Encounter addended by: Georga Kaufmann, CCT on: 10/21/2014  8:26 AM<BR>     Documentation filed: Charges VN

## 2014-11-16 ENCOUNTER — Telehealth (HOSPITAL_COMMUNITY): Payer: Self-pay | Admitting: Vascular Surgery

## 2014-11-16 NOTE — Telephone Encounter (Signed)
Pt called she is very SOB, She is very weak, she feels very bad she believes she needs to be seen... Please advise

## 2014-11-16 NOTE — Telephone Encounter (Signed)
Patient states since she broke her hip 8 weeks ago, has been increasingly deconditioned.  However, last day or two she has noticed significant increased SOB and exertion just sitting/barely moving.  Weight has fluctuated a lot, and is unable to weigh consistently every day.  Does not notice a lot of edema.  Hard to determine if this is heart failure related or pulmonary, so scheduled to be seen in our HF clinic tomorrow morning.  Advised patient to go to ED if symptoms worsen and she becomes unable to catch her breath.  Patient aware and agreeable.

## 2014-11-17 ENCOUNTER — Ambulatory Visit (HOSPITAL_COMMUNITY)
Admission: RE | Admit: 2014-11-17 | Discharge: 2014-11-17 | Disposition: A | Discharge status: Home / Self Care | Payer: Medicare Other | Source: Ambulatory Visit | Attending: Cardiology | Admitting: Cardiology

## 2014-11-17 ENCOUNTER — Encounter (HOSPITAL_COMMUNITY): Payer: Self-pay

## 2014-11-17 VITALS — BP 117/52 | HR 76 | Resp 26 | Wt 144.5 lb

## 2014-11-17 DIAGNOSIS — I5032 Chronic diastolic (congestive) heart failure: Secondary | ICD-10-CM | POA: Insufficient documentation

## 2014-11-17 DIAGNOSIS — Z951 Presence of aortocoronary bypass graft: Secondary | ICD-10-CM | POA: Diagnosis not present

## 2014-11-17 DIAGNOSIS — R6884 Jaw pain: Secondary | ICD-10-CM

## 2014-11-17 DIAGNOSIS — K922 Gastrointestinal hemorrhage, unspecified: Secondary | ICD-10-CM | POA: Diagnosis not present

## 2014-11-17 DIAGNOSIS — I2721 Secondary pulmonary arterial hypertension: Secondary | ICD-10-CM

## 2014-11-17 DIAGNOSIS — I2510 Atherosclerotic heart disease of native coronary artery without angina pectoris: Secondary | ICD-10-CM | POA: Diagnosis not present

## 2014-11-17 DIAGNOSIS — S72001D Fracture of unspecified part of neck of right femur, subsequent encounter for closed fracture with routine healing: Secondary | ICD-10-CM | POA: Diagnosis not present

## 2014-11-17 DIAGNOSIS — R0989 Other specified symptoms and signs involving the circulatory and respiratory systems: Secondary | ICD-10-CM | POA: Insufficient documentation

## 2014-11-17 DIAGNOSIS — K869 Disease of pancreas, unspecified: Secondary | ICD-10-CM

## 2014-11-17 DIAGNOSIS — K8689 Other specified diseases of pancreas: Secondary | ICD-10-CM

## 2014-11-17 DIAGNOSIS — I272 Other secondary pulmonary hypertension: Secondary | ICD-10-CM | POA: Diagnosis not present

## 2014-11-17 MED ORDER — LANSOPRAZOLE 15 MG PO CPDR: 15.0000 mg | DELAYED_RELEASE_CAPSULE | Freq: Every day | ORAL | Status: AC

## 2014-11-17 MED ORDER — ISOSORBIDE MONONITRATE ER 60 MG PO TB24: 60.0000 mg | ORAL_TABLET | Freq: Two times a day (BID) | ORAL | Status: AC

## 2014-11-17 NOTE — Progress Notes (Signed)
Advanced Heart Failure Medication Review by a Pharmacist   Pharmacist comments:  Upon review of Meagan [name]'s medication regimen, interaction between Prilosec and Plavix was noted. Plavix requires conversion to it's active metabolite via CYP2C19 and Prilosec is a moderate inhibitor of this enzyme. Because of this interaction, concomitant use of both medications could result in decreased concentrations of the active metabolite and thus the effectiveness of Plavix. I have recommended a switch to Protonix or Prevacid which are minor inhibitors of CYP2C19 and will not have as great of an inhibitory effect.   Ruta Hinds. Velva Harman, PharmD Clinical Pharmacist - Resident Pager: [phone] Pharmacy: [phone] 11/17/2014 10:27 AM

## 2014-11-17 NOTE — Progress Notes (Signed)
Patient ID: Meagan [name], female   DOB: 29-Nov-1945, 69 y.o.   MRN: [id number] GI: Dr Gala Romney Oncologist: Dr Tressie Stalker Cardiologsit: Dr Gilles Chiquito- [facility] IV Flolan PCP: Dr Gerarda Fraction  HPI: Meagan [name] is a 69 y/o woman with multiple medical problems including CAD s/p CABG and RCA stent, DM2, obesity, fibromyalgia, diastolic HF. She has severe PAH and is on Flolan followed by Dr. Gilles Chiquito at [facility].   Admitted to [facility] 02/22/14 with jaw pain and dyspnea. Had RHC/LHC which showed decreased PVR and stable coronaries. Diuresed with IV lasix. Also treated for possible pneumonia. Discharge weight was 150 pounds.   She was admitted again in 8/15 with upper GI bleeding as well as C difficile diarrhea.  She had no chest pain but mildly elevated troponin suggestive of demand ischemia.  She had an EGD showing esophagitis and gastritis.  Plavix was held initially but she is back on it.  No further melena or BRBPR. Abdominal US showed gallstones in the gallbladder.    Admitted in [facility] in October after a fall. Treated for R hip fracture and discharged to inpatient rehab. Rehab stay complicated by C diff. Completed 14 day course of vancomycin. Discharged to home 08/16/14.   She returns for follow up. On chronic Flolan. On 6 liters oxygen. Persistent jaw pain with exertion. Taking nitroglycerin a 4-5 times a week and says it relieves jaw pain.  She says that the jaw pain is a little worse. Limited ambulation due to dyspnea. She also says that there has been no change to her dyspnea pattern. Denies PND/Orthopnea. Occasionally having presyncope. Denies syncope. Weight at home 141-145 pounds. No bleeding problems. Hip pain getting better. Ambulated with rolling walker. Has follow up with Dr Gilles Chiquito in March 2015.  CT chest 10/10: No PE or ILD PFTs 10/10: Restrictive lung disease FEV1 1.01L (56%) FVC 1.2 (48%) DLCO 24% Rheum work-up which was negative. Sleep study 11/10: negative for OSA  RHC/LHC 01/21/13  RA = 11  RV = 82/0/15  PA =  78/18 (39)  PCW = 19  Fick cardiac output/index = 6.1/3.6  PVR = 3.3 Woods  FA sat = 93%  PA sat = 62%,64%  Ao Pressure: 106/39 (65)  LV Pressure: 124/5/16 Left main: Heavily calcified. Distal 40-50%  LAD: Diffuse severe disease. Totalled in midsection. Mid to distal vessel fills from LIMA. 2 diagonals with diffuse disease. There is a 70-80% lesion in the midsection of the 2nd diagonal  LCX: Small heavily diseased vessel. 30-40% ostial. Long 95% lesion in mid AV groove after take-off of OM-1. Distal vessel small and diffusely diseased. OM-1 mild plaque. OM-2 and OM-3 occluded  RCA: Ostial spasm. Long stent in midsection is patent. Distal RCA 40% before PDA. Mild to moderate plaque in PD and PLs.  LV-gram done in the RAO projection: Ejection fraction = 60-65% No wall motion abnormalities  LIMA-LAD: widely patent with 50-60% lesion in LAD after insertion of LIMA  SVG-OM: totally occluded proximally (chronic)   RHC/LHC 02/23/14  RA = 9  RV = 67/5/10  PA = 71/21 (39)  PCW = 13  Fick cardiac output/index = 5.5/3.1  PVR = 4.6 Woods  FA sat = 94%  PA sat = 63%,64%  Ao Pressure: 177/76 (119)  LV Pressure: 169/9/20  Left main: Heavily calcified. Distal 40-50%  LAD: Diffuse severe disease. Totaled in midsection. Mid to distal vessel fills from LIMA. 2 diagonals with diffuse disease. There is a 70-80% lesion in the midsection of the 2nd diagonal  LCX: Small heavily diseased vessel. 30-40% ostial. Long 95% lesion in mid AV groove after take-off of OM-1. Distal vessel small and diffusely diseased. OM-1 mild plaque. OM-2 and OM-3 occluded  RCA: Ostial spasm. Long stent in midsection is patent. Distal RCA 40% before PDA. In Mid PDA50% lesion. Mild to moderate plaque in PLs. R to L collaterals filling portion of LCX and septals  LIMA-LAD: widely patent with 70-75% eccentric lesion in LAD after insertion of LIMA. The apical LAD is occluded.  SVG-OM: totally occluded proximally (chronic)   Echo 06/30/13:  EF 60% RV moderately dilated. Evidence cor pulmonale RVSP 54  Labs (8/15): K 3.2, creatinine 1.48, HCT 33 Labs (10/20/14) K 4.6 Creatinine 0.90  ROS: All systems negative except as listed in HPI, PMH and Problem List.  Past Medical History  Diagnosis Date  . Fibromyalgia   . Hypercholesterolemia   . Hypertension     pulmonary  pap 110/31 (mean 62)by RHC 9/10 negative PE by chest CT 2010  . Pulmonary edema     Failed Revatio   . ARF (acute renal failure)     poor candidate for ACE -I or ARB  . Restrictive lung disease     obesity hypoventilation syndrome  . Renal insufficiency   . CAD (coronary artery disease)     multivessel DES x 2 RCA 2007  . Chronic diastolic heart failure   . MRSA (methicillin resistant staphylococcus aureus) pneumonia   . Pulmonary hypertension   . Chronic anemia     anemia of chronic disease based on anemia panel 2011  . Pancreatic mass 08/01/2012  . PONV (postoperative nausea and vomiting)   . Heart murmur     "when I was small"  . CHF (congestive heart failure)   . Anginal pain   . Pneumonia X 3  . Type II diabetes mellitus   . Hypothyroidism   . H/O hiatal hernia   . GERD (gastroesophageal reflux disease)   . Anxiety   . On home oxygen therapy     "6L all the time" (02/24/2014    Current Outpatient Prescriptions  Medication Sig Dispense Refill  . ALPRAZolam (XANAX) 0.5 MG tablet Take 2 tablets (1 mg total) by mouth at bedtime. For sleep. *Prescribed one tablet three times daily as needed* 30 tablet 0  . clopidogrel (PLAVIX) 75 MG tablet Take 1 tablet (75 mg total) by mouth daily. 90 tablet 3  . diclofenac sodium (VOLTAREN) 1 % GEL Apply 2 g topically 4 (four) times daily. 1 Tube 1  . DULoxetine (CYMBALTA) 30 MG capsule Take 1 capsule (30 mg total) by mouth at bedtime. 30 capsule 3  . DULoxetine (CYMBALTA) 60 MG capsule Take 1 capsule (60 mg total) by mouth every morning. 30 capsule 3  . Epoprostenol Sodium (FLOLAN IV) Inject into the vein. 1.5  mg. Inject into the vein continuously. Infuse mg/kg/min via cont IV pump.    Marland Kitchen ezetimibe-simvastatin (VYTORIN) 10-40 MG per tablet Take 1 tablet by mouth every evening. 30 tablet 1  . furosemide (LASIX) 40 MG tablet Take 2 tablets (80 mg total) by mouth daily. 30 tablet 1  . HYDROmorphone (DILAUDID) 2 MG tablet Take 2 tablets (4 mg total) by mouth every 6 (six) hours as needed for severe pain. 30 tablet 0  . insulin glargine (LANTUS) 100 UNIT/ML injection Inject 0.08 mLs (8 Units total) into the skin at bedtime. 10 mL 11  . isosorbide mononitrate (IMDUR) 60 MG 24 hr tablet Take 1.5 tablets (90  mg total) by mouth every morning. 30 tablet 1  . levothyroxine (SYNTHROID, LEVOTHROID) 25 MCG tablet Take 1 tablet (25 mcg total) by mouth every morning. 30 tablet 1  . metoprolol succinate (TOPROL-XL) 50 MG 24 hr tablet Take 1 tablet (50 mg total) by mouth daily. Take with or immediately following a meal. (Patient taking differently: Take 25 mg by mouth daily. Take with or immediately following a meal.) 30 tablet 1  . nitroGLYCERIN (NITROSTAT) 0.4 MG SL tablet Place 0.4 mg under the tongue every 5 (five) minutes as needed for chest pain.     Marland Kitchen omeprazole (PRILOSEC) 20 MG capsule Take 1 capsule (20 mg total) by mouth 2 (two) times daily. 60 capsule 1  . ondansetron (ZOFRAN) 8 MG tablet Take 8 mg by mouth 2 (two) times daily. For nausea    . Oxygen Permeable Lens Products SOLN Inhale into the lungs continuous. 6 liters    . Oxymetazoline HCl (NASAL SPRAY) 0.05 % SOLN Place 1 spray into the nose as needed (for congestion).    . potassium chloride SA (K-DUR,KLOR-CON) 10 MEQ tablet Take 4 tablets (40 mEq total) by mouth daily. 120 tablet 3  . Probiotic Product (FLORAJEN3 PO) Take 1 tablet by mouth daily.    . prochlorperazine (COMPAZINE) 5 MG tablet Take 1 tablet (5 mg total) by mouth every 6 (six) hours as needed for nausea or vomiting. 30 tablet 0  . saccharomyces boulardii (FLORASTOR) 250 MG capsule Take 1  capsule (250 mg total) by mouth 2 (two) times daily. 60 capsule 0  . [DISCONTINUED] cetirizine (ZYRTEC) 10 MG tablet Take 10 mg by mouth daily.       No current facility-administered medications for this encounter.    Filed Vitals:   11/17/14 0935  BP: 117/52  Pulse: 76  Resp: 26  Weight: 144 lb 8 oz (65.545 kg)  SpO2: 94%   PHYSICAL EXAM: General: No distress. Wearing 6L [place] O2 husband and sister present. Ambulated in the clinic with a rolling  HEENT: normal  Neck: thick . JVP 6-7 cm Carotids 2+ bilat; no bruits. No lymphadenopathy or thryomegaly appreciated.  Cor: PMI nonpalpable Regular rate & rhythm. 2/6 AS radiates to bilateral carotids.  Hickman catheter ok Lungs: clear but decreased throughout no wheezing on 6 liters [place]  Abdomen: obese. soft, nontender, nondistended. No hepatosplenomegaly. No bruits or masses. Good bowel sounds.  Extremities: no cyanosis, clubbing, macular-papular erythematous rash on LEs, traceedema  Neuro: alert & oriented x 3, cranial nerves grossly intact. moves all 4 extremities w/o difficulty. Affect pleasant.   EKG: NSR 80 bpm RBBB No ST-T wave abnormalities.    ASSESSMENT/PLAN:   1. PAH: On Flolan through [place]. She has ongoing jaw pain in a pattern that has been stable since starting Flolan.  She has NYHA class III symptoms (exertional dyspnea).    - Continue Flolan per Dr Gilles Chiquito. May need to cut back on flolan due to persistent jaw pain.  - Continue home oxygen.  - Continue current Lasix 2. CAD: s/p CABG.  Last cath earlier this year, medically managed CAD.  Jaw pain occurs with exertion but this is a chronic pattern, no recent change. - Set up myoview. If negative will discuss with Dr Gilles Chiquito. Increase Imdur to 60 bid Check carotid dopplers.  - - Remains on Plavix No further GI bleeding. She had gastritis/esophagitis on EGD and is on PPI.  3. Chronic diastolic HF: Primarily RV failure in setting of PAH.  Continue Lasix 80 mg  daily.  4. Upper GI  bleed: From esophagitis/gastritis.  As above, was started back on Plavix.  Continue for now unless there is a significant rebleed. Check prilosec to prevacid to avoid interaction with plavix as recommended by HF pharmacist.  5. R hip fracture: discharged 08/17/14 from CIR 6. Carotid bruits - likely radiated off AoV. Given vascular disease will check u/s Follow up  2 months    CLEGG,AMY NP-C  11/17/2014   Patient seen and examined with Darrick Grinder, NP. We discussed all aspects of the encounter. I agree with the assessment and plan as stated above.   Has persistent exertional jaw pain. Unclear etiology - ?angina vs Flolan. Cath 5/15 reviewed. It was ok. ECG unchanged. Will check lexiscan Myoview. If no signifcant ischemia will talk to Dr. Gilles Chiquito about trying to decrease Flolan. Increase Imdur. Can consider Ranexa.  Volume status looks good. She has carotid bruits. Likely radiated from AS but agree with carotid u/s.   Total time spent 35 minutes. Over half that time spent discussing above.    Daniel Bensimhon,MD 12:59 PM

## 2014-11-17 NOTE — Patient Instructions (Signed)
CHANGE Imdur to 60mg  twice daily.  Will schedule you for Lexiscan Myoview (drug-induced stress test) and carotid dopplers at the [place] at [facility] Address: [address], [place], [place] [address]  [phone]  Follow up 2 months.  Do the following things EVERYDAY: 1) Weigh yourself in the morning before breakfast. Write it down and keep it in a log. 2) Take your medicines as prescribed 3) Eat low salt foods-Limit salt (sodium) to 2000 mg per day.  4) Stay as active as you can everyday 5) Limit all fluids for the day to less than 2 liters

## 2014-12-01 ENCOUNTER — Telehealth (HOSPITAL_COMMUNITY): Payer: Self-pay

## 2014-12-01 NOTE — Telephone Encounter (Signed)
Adequate oxygen supply available for this patient for scheduled appointments on 12/03/2014. This was confirmed with Otho Perl. Pt husband given detailed instructions for NUC MPI . Pt husband did RBV same.

## 2014-12-03 ENCOUNTER — Ambulatory Visit (HOSPITAL_COMMUNITY)
Admission: RE | Admit: 2014-12-03 | Discharge: 2014-12-03 | Disposition: A | Discharge status: Home / Self Care | Payer: Medicare Other | Source: Ambulatory Visit | Attending: Cardiovascular Disease | Admitting: Cardiovascular Disease

## 2014-12-03 ENCOUNTER — Ambulatory Visit (HOSPITAL_BASED_OUTPATIENT_CLINIC_OR_DEPARTMENT_OTHER)
Admission: RE | Admit: 2014-12-03 | Discharge: 2014-12-03 | Disposition: A | Discharge status: Home / Self Care | Payer: Medicare Other | Source: Ambulatory Visit | Attending: Internal Medicine | Admitting: Internal Medicine

## 2014-12-03 ENCOUNTER — Telehealth (HOSPITAL_COMMUNITY): Payer: Self-pay | Admitting: *Deleted

## 2014-12-03 DIAGNOSIS — N189 Chronic kidney disease, unspecified: Secondary | ICD-10-CM | POA: Diagnosis not present

## 2014-12-03 DIAGNOSIS — R002 Palpitations: Secondary | ICD-10-CM | POA: Diagnosis not present

## 2014-12-03 DIAGNOSIS — Z833 Family history of diabetes mellitus: Secondary | ICD-10-CM | POA: Diagnosis not present

## 2014-12-03 DIAGNOSIS — Z955 Presence of coronary angioplasty implant and graft: Secondary | ICD-10-CM | POA: Diagnosis not present

## 2014-12-03 DIAGNOSIS — Z951 Presence of aortocoronary bypass graft: Secondary | ICD-10-CM | POA: Insufficient documentation

## 2014-12-03 DIAGNOSIS — Z8249 Family history of ischemic heart disease and other diseases of the circulatory system: Secondary | ICD-10-CM | POA: Diagnosis not present

## 2014-12-03 DIAGNOSIS — I2510 Atherosclerotic heart disease of native coronary artery without angina pectoris: Secondary | ICD-10-CM

## 2014-12-03 DIAGNOSIS — I5032 Chronic diastolic (congestive) heart failure: Secondary | ICD-10-CM | POA: Insufficient documentation

## 2014-12-03 DIAGNOSIS — I272 Other secondary pulmonary hypertension: Secondary | ICD-10-CM | POA: Insufficient documentation

## 2014-12-03 DIAGNOSIS — R0989 Other specified symptoms and signs involving the circulatory and respiratory systems: Secondary | ICD-10-CM

## 2014-12-03 MED ORDER — TECHNETIUM TC 99M SESTAMIBI GENERIC - CARDIOLITE
30.1000 | Freq: Once | INTRAVENOUS | Status: AC | PRN
  Administered 2014-12-03: 30.1 via INTRAVENOUS

## 2014-12-03 MED ORDER — TECHNETIUM TC 99M SESTAMIBI GENERIC - CARDIOLITE
10.5000 | Freq: Once | INTRAVENOUS | Status: AC | PRN
  Administered 2014-12-03: 11 via INTRAVENOUS

## 2014-12-03 MED ORDER — AMINOPHYLLINE 25 MG/ML IV SOLN
125.0000 mg | Freq: Once | INTRAVENOUS | Status: AC
  Administered 2014-12-03: 125 mg via INTRAVENOUS

## 2014-12-03 MED ORDER — REGADENOSON 0.4 MG/5ML IV SOLN
0.4000 mg | Freq: Once | INTRAVENOUS | Status: AC
  Administered 2014-12-03: 0.4 mg via INTRAVENOUS

## 2014-12-03 NOTE — Procedures (Addendum)
[place] [address] [place] [employer] [facility] [place] [address] [phone]  Cardiology Nuclear Med Study  Meagan [name] is a 69 y.o. female     MRN : [id number]     DOB: 06-Jun-1946  Procedure Date: 12/03/2014  Nuclear Med Background Indication for Stress Test:  Evaluation for Ischemia and Follow up CAD History:  CAD;CABG x2-1997;STENT/PTCA-2007;RBBB;CHF;Pulmonary Hypertension;Restrictive Lung Disease;Heart Murmur;NSVT;CKD III;Renal Insufficienct Cardiac Risk Factors: Family History - CAD, Hypertension, IDDM Type 2, Lipids, Overweight and RBBB  Symptoms:  Chest Pain, DOE, Fatigue, Palpitations, SOB and Jaw pain   Nuclear Pre-Procedure Caffeine/Decaff Intake:  9:00pm NPO After: 7:00am   IV Site: R Forearm  IV 0.9% NS with Angio Cath:  22g  Chest Size (in):  n/a IV Started by: Rolene Course, RN  Height: 5' (1.524 m)  Cup Size: C  BMI:  Body mass index is 28.12 kg/(m^2). Weight:  144 lb (65.318 kg)   Tech Comments:  n/a    Nuclear Med Study 1 or 2 day study: 1 day  Stress Test Type:  [place] Provider:  Glori Bickers, MD   Resting Radionuclide: Technetium 27m Sestamibi  Resting Radionuclide Dose: 10.5 mCi   Stress Radionuclide:  Technetium 46m Sestamibi  Stress Radionuclide Dose: 30.1 mCi           Stress Protocol Rest HR: 74 Stress HR: 81  Rest BP: 144/72 Stress BP: 132/55  Exercise Time (min): n/a METS: n/a   Predicted Max HR: 151 bpm % Max HR: 56.29 bpm Rate Pressure Product: 12240  Dose of Adenosine (mg):  n/a Dose of Lexiscan: 0.4 mg  Dose of Atropine (mg): n/a Dose of Dobutamine: n/a mcg/kg/min (at max HR)  Stress Test Technologist: Leane Para, CCT Nuclear Technologist: Imagene Riches, CNMT   Rest Procedure:  Myocardial perfusion imaging was performed at rest 45 minutes following the intravenous administration of Technetium 62m Sestamibi. Stress Procedure:  The patient received IV Lexiscan 0.4 mg  over 15-seconds.  Technetium 56m Sestamibi injected IV at 30-seconds.  Patient experienced sever SOB and 125 mg Aminophylline IV was administered. There were no significant changes with Lexiscan.  Quantitative spect images were obtained after a 45 minute delay.  Transient Ischemic Dilatation (Normal <1.22):  1.49  QGS EDV:  75 ml QGS ESV:  29 ml LV Ejection Fraction: 61%       Rest ECG: NSR-RBBB  Stress ECG: No significant change from baseline ECG  QPS Raw Data Images:  Normal; no motion artifact; normal heart/lung ratio. Stress Images:  There is decreased uptake in the lateral wall. Rest Images:  Normal homogeneous uptake in all areas of the myocardium. Subtraction (SDS):  These findings are consistent with ischemia.  Impression Exercise Capacity:  Lexiscan with no exercise. BP Response:  Normal blood pressure response. Clinical Symptoms:  No significant symptoms noted. ECG Impression:  No significant ST segment change suggestive of ischemia. Comparison with Prior Nuclear Study: No images to compare  Overall Impression:  Intermediate risk stress nuclear study Inferior/inferolateral wall ischemia. Intermediate risk. Follow up with Dr. Haroldine Laws.  LV Wall Motion:  NL LV Function; NL Wall Motion   Lorretta Harp, MD  12/03/2014 1:43 PM

## 2014-12-03 NOTE — Progress Notes (Signed)
Carotid Duplex Completed. Preliminary results by tech - The left ICA was occluded and the right ICA showed a 70-99% stenosis. Results was given to Dr. Gwenlyn Found and the patient  Is scheduled for a PV consult on February 16. Oda Cogan, BS, RDMS, RVT

## 2014-12-07 ENCOUNTER — Ambulatory Visit (INDEPENDENT_AMBULATORY_CARE_PROVIDER_SITE_OTHER): Payer: Medicare Other | Admitting: Cardiovascular Disease

## 2014-12-07 ENCOUNTER — Encounter: Payer: Self-pay | Admitting: Cardiovascular Disease

## 2014-12-07 VITALS — BP 120/56 | HR 80 | Ht 60.0 in | Wt 142.0 lb

## 2014-12-07 DIAGNOSIS — Z01818 Encounter for other preprocedural examination: Secondary | ICD-10-CM

## 2014-12-07 DIAGNOSIS — R5383 Other fatigue: Secondary | ICD-10-CM

## 2014-12-07 DIAGNOSIS — I2510 Atherosclerotic heart disease of native coronary artery without angina pectoris: Secondary | ICD-10-CM

## 2014-12-07 DIAGNOSIS — D689 Coagulation defect, unspecified: Secondary | ICD-10-CM

## 2014-12-07 DIAGNOSIS — I739 Peripheral vascular disease, unspecified: Secondary | ICD-10-CM

## 2014-12-07 DIAGNOSIS — I779 Disorder of arteries and arterioles, unspecified: Secondary | ICD-10-CM

## 2014-12-07 NOTE — Patient Instructions (Signed)
Dr. Gwenlyn Found has ordered a Peripheral Angiogram (Right groin; Carotid Stent-Dr. Trula Slade to be present; March) to be done at [facility].  This procedure is going to look at the bloodflow in your lower extremities.  If Dr. Gwenlyn Found is able to open up the arteries, you will have to spend one night in the hospital.  If he is not able to open the arteries, you will be able to go home that same day.    After the procedure, you will not be allowed to drive for 3 days or push, pull, or lift anything greater than 10 lbs for one week.    You will be required to have the following tests prior to the procedure:  1. Blood work-the blood work can be done no more than 7 days prior to the procedure.  It can be done at any [facility] lab.  There is one downstairs on the first floor of this building and one in the [place] ([address])  2. Chest Xray-the chest xray order has already been placed at the [facility].     *REPS Bobby Rumpf

## 2014-12-07 NOTE — Progress Notes (Signed)
12/07/2014 Meagan [name]   [birth year]  [id number]  Primary Physician Glo Herring., MD Primary Cardiologist: Lorretta Harp MD Renae Gloss   HPI:  Meagan [name] is a 69 year old moderately overweight married Caucasian female with no children is accompanied by her husband today. Her cardiologist is Dr. Haroldine Laws  and CAD. for pulmonary hypertension. She has a history of coronary artery bypass grafting and subsequent stenting. Her problems include severe pulmonary hypertension. She has treated hypertension, hyperlipidemia and diabetes. Because of jaw pain she underwent Myoview stress testing on 12/03/14 revealing inferolateral ischemia. 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 She is neurologically a symptomatically on Plavix. I believe she is high risk to undergo endarterectomy because of her severe hypertension and pulmonary issues as well as the fact that she has occluded left internal carotid artery. She is best suited for coronary artery stenting. Prior to this however she will need cardiac catheterization to define her coronary anatomy given her jaw pain and positive Myoview stress test.   Current Outpatient Prescriptions  Medication Sig Dispense Refill  . ALPRAZolam (XANAX) 0.5 MG tablet Take 2 tablets (1 mg total) by mouth at bedtime. For sleep. *Prescribed one tablet three times daily as needed* 30 tablet 0  . clopidogrel (PLAVIX) 75 MG tablet Take 1 tablet (75 mg total) by mouth daily. 90 tablet 3  . diclofenac sodium (VOLTAREN) 1 % GEL Apply 2 g topically 4 (four) times daily. 1 Tube 1  . DULoxetine (CYMBALTA) 30 MG capsule Take 1 capsule (30 mg total) by mouth at bedtime. 30 capsule 3  . DULoxetine (CYMBALTA) 60 MG capsule Take 1 capsule (60 mg total) by mouth every morning. 30 capsule 3  . Epoprostenol Sodium (FLOLAN IV) Inject into the vein. 1.5 mg. Inject into the vein continuously. Infuse mg/kg/min via  cont IV pump.    Marland Kitchen ezetimibe-simvastatin (VYTORIN) 10-40 MG per tablet Take 1 tablet by mouth every evening. 30 tablet 1  . furosemide (LASIX) 40 MG tablet Take 2 tablets (80 mg total) by mouth daily. 30 tablet 1  . HYDROmorphone (DILAUDID) 2 MG tablet Take 2 tablets (4 mg total) by mouth every 6 (six) hours as needed for severe pain. 30 tablet 0  . insulin glargine (LANTUS) 100 UNIT/ML injection Inject 0.08 mLs (8 Units total) into the skin at bedtime. 10 mL 11  . isosorbide mononitrate (IMDUR) 60 MG 24 hr tablet Take 1 tablet (60 mg total) by mouth 2 (two) times daily. 60 tablet 3  . lansoprazole (PREVACID) 15 MG capsule Take 1 capsule (15 mg total) by mouth daily at 12 noon. 90 capsule 3  . levothyroxine (SYNTHROID, LEVOTHROID) 25 MCG tablet Take 1 tablet (25 mcg total) by mouth every morning. 30 tablet 1  . metoprolol succinate (TOPROL-XL) 50 MG 24 hr tablet Take 1 tablet (50 mg total) by mouth daily. Take with or immediately following a meal. (Patient taking differently: Take 25 mg by mouth daily. Take with or immediately following a meal.) 30 tablet 1  . nitroGLYCERIN (NITROSTAT) 0.4 MG SL tablet Place 0.4 mg under the tongue every 5 (five) minutes as needed for chest pain.     Marland Kitchen ondansetron (ZOFRAN) 8 MG tablet Take 8 mg by mouth 2 (two) times daily. For nausea    . Oxygen Permeable Lens Products SOLN Inhale into the lungs continuous. 6 liters    . Oxymetazoline HCl (NASAL SPRAY) 0.05 % SOLN Place 1 spray into the  nose as needed (for congestion).    . potassium chloride SA (K-DUR,KLOR-CON) 10 MEQ tablet Take 4 tablets (40 mEq total) by mouth daily. 120 tablet 3  . Probiotic Product (FLORAJEN3 PO) Take 1 tablet by mouth daily.    . prochlorperazine (COMPAZINE) 5 MG tablet Take 1 tablet (5 mg total) by mouth every 6 (six) hours as needed for nausea or vomiting. 30 tablet 0  . saccharomyces boulardii (FLORASTOR) 250 MG capsule Take 1 capsule (250 mg total) by mouth 2 (two) times daily. 60 capsule  0  . [DISCONTINUED] cetirizine (ZYRTEC) 10 MG tablet Take 10 mg by mouth daily.       No current facility-administered medications for this visit.    Allergies  Allergen Reactions  . Aspirin     REACTION: Intolerance  . Codeine Other (See Comments)    Messes with digestive system  . Niacin Other (See Comments)    Pins sticking into skin  . Nsaids Other (See Comments)    REACTION: Aggravates Digestive System   . Phenergan Fortis [Promethazine] Other (See Comments)    Family and pt states becomes very confused and wild.   . Sulfonamide Derivatives Other (See Comments)    Messes with digestive system    History   Social History  . Marital Status: Married    Spouse Name: N/A  . Number of Children: N/A  . Years of Education: N/A   Occupational History  . Not on file.   Social History Main Topics  . Smoking status: Never Smoker   . Smokeless tobacco: Never Used  . Alcohol Use: No  . Drug Use: No  . Sexual Activity: Not Currently   Other Topics Concern  . Not on file   Social History Narrative     Review of Systems: General: negative for chills, fever, night sweats or weight changes.  Cardiovascular: negative for chest pain, dyspnea on exertion, edema, orthopnea, palpitations, paroxysmal nocturnal dyspnea or shortness of breath Dermatological: negative for rash Respiratory: negative for cough or wheezing Urologic: negative for hematuria Abdominal: negative for nausea, vomiting, diarrhea, bright red blood per rectum, melena, or hematemesis Neurologic: negative for visual changes, syncope, or dizziness All other systems reviewed and are otherwise negative except as noted above.    Blood pressure 120/56, pulse 80, height 5' (1.524 m), weight 142 lb (64.411 kg).  General appearance: alert and no distress Neck: no adenopathy, no JVD, supple, symmetrical, trachea midline, thyroid not enlarged, symmetric, no tenderness/mass/nodules and soft right carotid bruit Lungs:  clear to auscultation bilaterally Heart: regular rate and rhythm, S1, S2 normal, no murmur, click, rub or gallop Extremities: extremities normal, atraumatic, no cyanosis or edema  EKG not performed today  ASSESSMENT AND PLAN:   Bilateral carotid artery disease I am seeing this balding at the request of Dr. Haroldine Laws  for evaluation of high-grade bilateral carotid artery stenosis.she has a history of coronary artery disease status post remote coronary artery bypass grafting and stenting in the past. She also has severe pulmonary hypertension on pulmonary vasodilators. She had carotid Dopplers recently performed on 12/03/14 revealing an occluded left internal carotid artery and high-grade right internal carotid artery stenosis. She is neurologically asymptomatic. She is on antiplatelet therapy. I believe she is high risk for endarterectomy given her pulmonary status as well as being on dual antiplatelet therapy, her carotid disease would be best treated by stenting.       Lorretta Harp MD FACP,FACC,FAHA, [facility] 12/07/2014 4:12 PM

## 2014-12-07 NOTE — Addendum Note (Signed)
Addended by: Blanca Carreon, [place] on: 12/07/2014 05:56 PM   Modules accepted: Orders

## 2014-12-07 NOTE — Assessment & Plan Note (Signed)
I am seeing this balding at the request of Dr. Haroldine Laws  for evaluation of high-grade bilateral carotid artery stenosis.she has a history of coronary artery disease status post remote coronary artery bypass grafting and stenting in the past. She also has severe pulmonary hypertension on pulmonary vasodilators. She had carotid Dopplers recently performed on 12/03/14 revealing an occluded left internal carotid artery and high-grade right internal carotid artery stenosis. She is neurologically asymptomatic. She is on antiplatelet therapy. I believe she is high risk for endarterectomy given her pulmonary status as well as being on dual antiplatelet therapy, her carotid disease would be best treated by stenting.

## 2014-12-27 ENCOUNTER — Telehealth: Payer: Self-pay | Admitting: Cardiovascular Disease

## 2014-12-27 ENCOUNTER — Telehealth: Payer: Self-pay | Admitting: *Deleted

## 2014-12-27 NOTE — Telephone Encounter (Signed)
Has some questions about her procedure , Please call    Thanks

## 2014-12-27 NOTE — Telephone Encounter (Signed)
Returned call to patient. Spoke with husband - he was the one who called in .  Husband was wondering when PV angiogram was going to be scheduled - he states they were told it was going to be in 2 weeks, after Feb 16th appointment, and they have not gotten a call about this. There is no documentation in EPIC as to why this procedure has not been scheduled.    Per Dr. Kennon Holter note, patient needed to have [place] prior to PV angiogram - this was discussed with schedulers [place] when RN attempted to reach out to assist patient in scheduling procedure. It appears as if in talking with patient's husband they were NOT aware that patient needed LHC prior to [facility] angio procedure and are frustrated and confused as to why this has not been set up. There is also an issue of who will do LHC - Dr. Gwenlyn Found or Dr. Haroldine Laws   There is no documentation in EPIC to aid in figuring this situation out.   Will defer to [place], Therapist, sports to contact patient/husband and explain situation.

## 2014-12-27 NOTE — Telephone Encounter (Signed)
Jolaine Artist, MD  [place] Keshawn Fiorito, RN           Thanks Niger but I did her last cath and I will discuss with her and if we decide to repeat I will work on getting her in this week. -dan       Previous Messages     ----- Message -----   From: [place] Hyland Mollenkopf, RN   Sent: 12/13/2014  4:05 PM    To: Jolaine Artist, MD  Subject: [facility] Patient and their Plan of Care       Good Afternoon Dr. Haroldine Laws,   I am Dr. Kennon Holter RN. He is currently out of the office on vacation. I spoke with him about Ms. Duty. Dr. Gwenlyn Found would like to know if it is ok with you that he does a heart cath on the patient on 2/29 in the morning. Then he would like to arrange for her to have a carotid stent within a week or two.   He would like to know if you agree with this plan of care. Please let me know and I will arrange for the patient to have the procedures and let Dr. Gwenlyn Found know.   Thank you,   [place] Meyli Boice, RN-BSN  [facility]

## 2014-12-28 ENCOUNTER — Other Ambulatory Visit: Payer: Self-pay | Admitting: *Deleted

## 2014-12-28 ENCOUNTER — Telehealth (HOSPITAL_COMMUNITY): Payer: Self-pay | Admitting: Cardiology

## 2014-12-28 ENCOUNTER — Encounter (HOSPITAL_COMMUNITY): Payer: Self-pay | Admitting: *Deleted

## 2014-12-28 ENCOUNTER — Telehealth (HOSPITAL_COMMUNITY): Payer: Self-pay | Admitting: *Deleted

## 2014-12-28 NOTE — Telephone Encounter (Signed)
Per Dr Haroldine Laws pt needs LHC due to abn myoview, cath sch for Mon 3/14 at 10 am, pt and husband aware and instructions reviewed with them via phone.

## 2014-12-28 NOTE — Telephone Encounter (Signed)
Pt scheduled for LHC on 01/03/15 with Dr. Haroldine Laws Cpt code [phone] With pts current insurance- Medicare No pre cert required for procedure

## 2014-12-29 NOTE — Telephone Encounter (Signed)
Please see additional telephone encounter for information.

## 2015-01-03 ENCOUNTER — Encounter (HOSPITAL_COMMUNITY): Payer: Self-pay | Admitting: *Deleted

## 2015-01-03 ENCOUNTER — Ambulatory Visit (HOSPITAL_COMMUNITY)
Admission: RE | Admit: 2015-01-03 | Discharge: 2015-01-03 | Disposition: A | Discharge status: Home / Self Care | Payer: Medicare Other | Source: Ambulatory Visit | Attending: Internal Medicine | Admitting: Internal Medicine

## 2015-01-03 ENCOUNTER — Encounter (HOSPITAL_COMMUNITY)
Admission: RE | Disposition: A | Discharge status: Home / Self Care | Payer: Self-pay | Source: Ambulatory Visit | Attending: Internal Medicine

## 2015-01-03 DIAGNOSIS — E119 Type 2 diabetes mellitus without complications: Secondary | ICD-10-CM | POA: Insufficient documentation

## 2015-01-03 DIAGNOSIS — Z7902 Long term (current) use of antithrombotics/antiplatelets: Secondary | ICD-10-CM | POA: Diagnosis not present

## 2015-01-03 DIAGNOSIS — Z951 Presence of aortocoronary bypass graft: Secondary | ICD-10-CM | POA: Diagnosis not present

## 2015-01-03 DIAGNOSIS — I2582 Chronic total occlusion of coronary artery: Secondary | ICD-10-CM | POA: Insufficient documentation

## 2015-01-03 DIAGNOSIS — I2510 Atherosclerotic heart disease of native coronary artery without angina pectoris: Secondary | ICD-10-CM | POA: Insufficient documentation

## 2015-01-03 DIAGNOSIS — I2589 Other forms of chronic ischemic heart disease: Secondary | ICD-10-CM | POA: Insufficient documentation

## 2015-01-03 DIAGNOSIS — I270 Primary pulmonary hypertension: Secondary | ICD-10-CM | POA: Diagnosis not present

## 2015-01-03 DIAGNOSIS — Z791 Long term (current) use of non-steroidal anti-inflammatories (NSAID): Secondary | ICD-10-CM | POA: Diagnosis not present

## 2015-01-03 DIAGNOSIS — R9439 Abnormal result of other cardiovascular function study: Secondary | ICD-10-CM | POA: Diagnosis present

## 2015-01-03 DIAGNOSIS — I272 Other secondary pulmonary hypertension: Secondary | ICD-10-CM | POA: Diagnosis not present

## 2015-01-03 DIAGNOSIS — Z79899 Other long term (current) drug therapy: Secondary | ICD-10-CM | POA: Diagnosis not present

## 2015-01-03 DIAGNOSIS — Z794 Long term (current) use of insulin: Secondary | ICD-10-CM | POA: Diagnosis not present

## 2015-01-03 DIAGNOSIS — I10 Essential (primary) hypertension: Secondary | ICD-10-CM | POA: Diagnosis not present

## 2015-01-03 DIAGNOSIS — Z955 Presence of coronary angioplasty implant and graft: Secondary | ICD-10-CM | POA: Insufficient documentation

## 2015-01-03 DIAGNOSIS — Z79891 Long term (current) use of opiate analgesic: Secondary | ICD-10-CM | POA: Diagnosis not present

## 2015-01-03 DIAGNOSIS — R079 Chest pain, unspecified: Secondary | ICD-10-CM | POA: Diagnosis present

## 2015-01-03 DIAGNOSIS — I2781 Cor pulmonale (chronic): Secondary | ICD-10-CM | POA: Diagnosis not present

## 2015-01-03 HISTORY — PX: LEFT HEART CATHETERIZATION WITH CORONARY ANGIOGRAM: SHX5451

## 2015-01-03 LAB — CBC
HEMATOCRIT: 26.6 % — AB (ref 36.0–46.0)
Hemoglobin: 8.6 g/dL — ABNORMAL LOW (ref 12.0–15.0)
MCH: 26.6 pg (ref 26.0–34.0)
MCHC: 32.3 g/dL (ref 30.0–36.0)
MCV: 82.4 fL (ref 78.0–100.0)
Platelets: ADEQUATE 10*3/uL (ref 150–400)
RBC: 3.23 MIL/uL — ABNORMAL LOW (ref 3.87–5.11)
RDW: 14.7 % (ref 11.5–15.5)
WBC: 14.5 10*3/uL — ABNORMAL HIGH (ref 4.0–10.5)

## 2015-01-03 LAB — BASIC METABOLIC PANEL
Anion gap: 11 (ref 5–15)
BUN: 27 mg/dL — ABNORMAL HIGH (ref 6–23)
CO2: 28 mmol/L (ref 19–32)
Calcium: 8.9 mg/dL (ref 8.4–10.5)
Chloride: 95 mmol/L — ABNORMAL LOW (ref 96–112)
Creatinine, Ser: 1.06 mg/dL (ref 0.50–1.10)
GFR calc Af Amer: 60 mL/min — ABNORMAL LOW (ref >90)
GFR calc non Af Amer: 52 mL/min — ABNORMAL LOW (ref >90)
Glucose, Bld: 137 mg/dL — ABNORMAL HIGH (ref 70–99)
POTASSIUM: 4.9 mmol/L (ref 3.5–5.1)
SODIUM: 134 mmol/L — AB (ref 135–145)

## 2015-01-03 LAB — GLUCOSE, CAPILLARY
GLUCOSE-CAPILLARY: 141 mg/dL — AB (ref 70–99)
Glucose-Capillary: 114 mg/dL — ABNORMAL HIGH (ref 70–99)

## 2015-01-03 LAB — PROTIME-INR
INR: 1.17 (ref 0.00–1.49)
Prothrombin Time: 15.1 seconds (ref 11.6–15.2)

## 2015-01-03 SURGERY — LEFT HEART CATHETERIZATION WITH CORONARY ANGIOGRAM
Anesthesia: LOCAL

## 2015-01-03 MED ORDER — NITROGLYCERIN 1 MG/10 ML FOR IR/CATH LAB
INTRA_ARTERIAL | Status: AC
  Filled 2015-01-03: qty 10

## 2015-01-03 MED ORDER — FENTANYL CITRATE 0.05 MG/ML IJ SOLN
INTRAMUSCULAR | Status: AC
  Filled 2015-01-03: qty 2

## 2015-01-03 MED ORDER — SODIUM CHLORIDE 0.9 % IV SOLN: 250.0000 mL | INTRAVENOUS | Status: DC | PRN

## 2015-01-03 MED ORDER — SODIUM CHLORIDE 0.9 % IV SOLN
INTRAVENOUS | Status: DC
  Administered 2015-01-03: 09:00:00 via INTRAVENOUS

## 2015-01-03 MED ORDER — SODIUM CHLORIDE 0.9 % IV SOLN: INTRAVENOUS | Status: AC

## 2015-01-03 MED ORDER — ACETAMINOPHEN 325 MG PO TABS
650.0000 mg | ORAL_TABLET | ORAL | Status: DC | PRN
  Administered 2015-01-03: 650 mg via ORAL

## 2015-01-03 MED ORDER — SODIUM CHLORIDE 0.9 % IJ SOLN: 3.0000 mL | INTRAMUSCULAR | Status: DC | PRN

## 2015-01-03 MED ORDER — LIDOCAINE HCL (PF) 1 % IJ SOLN
INTRAMUSCULAR | Status: AC
  Filled 2015-01-03: qty 30

## 2015-01-03 MED ORDER — ONDANSETRON HCL 4 MG/2ML IJ SOLN: 4.0000 mg | Freq: Four times a day (QID) | INTRAMUSCULAR | Status: DC | PRN

## 2015-01-03 MED ORDER — SODIUM CHLORIDE 0.9 % IJ SOLN: 3.0000 mL | Freq: Two times a day (BID) | INTRAMUSCULAR | Status: DC

## 2015-01-03 MED ORDER — HEPARIN (PORCINE) IN NACL 2-0.9 UNIT/ML-% IJ SOLN
INTRAMUSCULAR | Status: AC
  Filled 2015-01-03: qty 1500

## 2015-01-03 MED ORDER — ONDANSETRON HCL 4 MG/2ML IJ SOLN
INTRAMUSCULAR | Status: AC
Start: 2015-01-03 — End: 2015-01-03
  Filled 2015-01-03: qty 2

## 2015-01-03 MED ORDER — ACETAMINOPHEN 325 MG PO TABS
ORAL_TABLET | ORAL | Status: AC
  Administered 2015-01-03: 650 mg via ORAL
  Filled 2015-01-03: qty 2

## 2015-01-03 MED ORDER — MIDAZOLAM HCL 2 MG/2ML IJ SOLN
INTRAMUSCULAR | Status: AC
  Filled 2015-01-03: qty 2

## 2015-01-03 NOTE — CV Procedure (Signed)
Cardiac Cath Procedure Note  Indication: Chest pain, PAH and abnormal Myoview with lateral ischemia  Procedures performed:  1) Right heart cathererization 2) Selective coronary angiography 3) Left heart catheterization 4) Left ventriculogram  Description of procedure:     The risks and indication of the procedure were explained. Consent was signed and placed on the chart. An appropriate timeout was taken prior to the procedure. The right groin was prepped and draped in the routine sterile fashion and anesthetized with 1% local lidocaine.   A 5 FR arterial sheath was placed in the right femoral artery using a modified Seldinger technique. Standard catheters including a JL4, JR4 and angled pigtail were used. All catheter exchanges were made over a wire. A 7 FR venous sheath was placed in the right femoral vein using a modified Seldinger technique. A standard Swan-Ganz catheter was used for the procedure.   Complications:  None apparent  Findings:  RA = 16 RV = 85/5/18 PA = 80/20 (45) PCW = 22 Fick cardiac output/index = 4.3/2.6 PVR = 5.6 WU Ao sat = 89% PA sat = 45%, 47%  Ao Pressure: 109/46 (69) LV Pressure:  130/5/17 There was no signficant gradient across the aortic valve on pullback.  Left main: Heavily calcified. Distal 40-50%   LAD: Diffuse severe disease. Totaled in midsection. Mid to distal vessel fills from LIMA. 2 diagonals with diffuse disease. There is a 70-80% lesion in the proximal to midsection of the 2nd diagonal   LCX: Small ramus. Occluded in midsection. Small heavily diseased vessel. 30-40% ostial. Long 95% lesion in mid AV groove after take-off of OM-1. Distal vessel small and diffusely diseased. OM-1 mild plaque. OM-2 and OM-3 occluded fill faintly from L to L colalterals  RCA: Dominant vessel. Ostial 50-60% lesion with spasm. Long stent in midsection is patent with mild plaque in stent. Distal RCA 40% before PDA. In Mid PDA 60-70% lesion. Mild to moderate  plaque in PLs. R to L collaterals filling portion of LCX and septals  LIMA-LAD: widely patent with 40-50% eccentric lesion in LAD after insertion of LIMA. This looks improved since previous  SVG-OM: Not injected. Known to be totally occluded proximally (chronic)   LV-gram done in the RAO projection: Ejection fraction = 55% No wall motion abnormalities    Assessment: 1. 3v CAD with stable coronary anatomy. Lateral wall ischemia on Myoview likely due to known occlusion of OM2 and OM3 2. Normal LVEF 3. Worsening PA pressures and cor pulmonale in setting of recent decrease in Flolaln  Plan/Discussion:  Coronary anatomy is stable. PA pressures are up significantly from previous after Flolan decreased. I communicated these findings to the [facility] team.   Glori Bickers, MD 10:59 AM

## 2015-01-03 NOTE — Discharge Instructions (Signed)

## 2015-01-03 NOTE — Interval H&P Note (Signed)
History and Physical Interval Note:  01/03/2015 10:47 AM  Meagan [name]  has presented today for surgery, with the diagnosis of abnormal stress test  The various methods of treatment have been discussed with the patient and family. After consideration of risks, benefits and other options for treatment, the patient has consented to  Procedure(s): LEFT HEART CATHETERIZATION WITH CORONARY ANGIOGRAM (N/A) and possible angiopalsty as a surgical intervention .  The patient's history has been reviewed, patient examined, no change in status, stable for surgery.  I have reviewed the patient's chart and labs.  Questions were answered to the patient's satisfaction.     Daniel Bensimhon

## 2015-01-03 NOTE — H&P (View-Only) (Signed)
12/07/2014 Meagan [name]   04/28/1946  [id number]  Primary Physician Glo Herring., MD Primary Cardiologist: Lorretta Harp MD Renae Gloss   HPI:  Meagan [name] is a 69 year old moderately overweight married Caucasian female with no children is accompanied by her husband today. Her cardiologist is Dr. Haroldine Laws  and CAD. for pulmonary hypertension. She has a history of coronary artery bypass grafting and subsequent stenting. Her problems include severe pulmonary hypertension. She has treated hypertension, hyperlipidemia and diabetes. Because of jaw pain she underwent Myoview stress testing on 12/03/14 revealing inferolateral ischemia. 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 She is neurologically a symptomatically on Plavix. I believe she is high risk to undergo endarterectomy because of her severe hypertension and pulmonary issues as well as the fact that she has occluded left internal carotid artery. She is best suited for coronary artery stenting. Prior to this however she will need cardiac catheterization to define her coronary anatomy given her jaw pain and positive Myoview stress test.   Current Outpatient Prescriptions  Medication Sig Dispense Refill  . ALPRAZolam (XANAX) 0.5 MG tablet Take 2 tablets (1 mg total) by mouth at bedtime. For sleep. *Prescribed one tablet three times daily as needed* 30 tablet 0  . clopidogrel (PLAVIX) 75 MG tablet Take 1 tablet (75 mg total) by mouth daily. 90 tablet 3  . diclofenac sodium (VOLTAREN) 1 % GEL Apply 2 g topically 4 (four) times daily. 1 Tube 1  . DULoxetine (CYMBALTA) 30 MG capsule Take 1 capsule (30 mg total) by mouth at bedtime. 30 capsule 3  . DULoxetine (CYMBALTA) 60 MG capsule Take 1 capsule (60 mg total) by mouth every morning. 30 capsule 3  . Epoprostenol Sodium (FLOLAN IV) Inject into the vein. 1.5 mg. Inject into the vein continuously. Infuse mg/kg/min via  cont IV pump.    Marland Kitchen ezetimibe-simvastatin (VYTORIN) 10-40 MG per tablet Take 1 tablet by mouth every evening. 30 tablet 1  . furosemide (LASIX) 40 MG tablet Take 2 tablets (80 mg total) by mouth daily. 30 tablet 1  . HYDROmorphone (DILAUDID) 2 MG tablet Take 2 tablets (4 mg total) by mouth every 6 (six) hours as needed for severe pain. 30 tablet 0  . insulin glargine (LANTUS) 100 UNIT/ML injection Inject 0.08 mLs (8 Units total) into the skin at bedtime. 10 mL 11  . isosorbide mononitrate (IMDUR) 60 MG 24 hr tablet Take 1 tablet (60 mg total) by mouth 2 (two) times daily. 60 tablet 3  . lansoprazole (PREVACID) 15 MG capsule Take 1 capsule (15 mg total) by mouth daily at 12 noon. 90 capsule 3  . levothyroxine (SYNTHROID, LEVOTHROID) 25 MCG tablet Take 1 tablet (25 mcg total) by mouth every morning. 30 tablet 1  . metoprolol succinate (TOPROL-XL) 50 MG 24 hr tablet Take 1 tablet (50 mg total) by mouth daily. Take with or immediately following a meal. (Patient taking differently: Take 25 mg by mouth daily. Take with or immediately following a meal.) 30 tablet 1  . nitroGLYCERIN (NITROSTAT) 0.4 MG SL tablet Place 0.4 mg under the tongue every 5 (five) minutes as needed for chest pain.     Marland Kitchen ondansetron (ZOFRAN) 8 MG tablet Take 8 mg by mouth 2 (two) times daily. For nausea    . Oxygen Permeable Lens Products SOLN Inhale into the lungs continuous. 6 liters    . Oxymetazoline HCl (NASAL SPRAY) 0.05 % SOLN Place 1 spray into the  nose as needed (for congestion).    . potassium chloride SA (K-DUR,KLOR-CON) 10 MEQ tablet Take 4 tablets (40 mEq total) by mouth daily. 120 tablet 3  . Probiotic Product (FLORAJEN3 PO) Take 1 tablet by mouth daily.    . prochlorperazine (COMPAZINE) 5 MG tablet Take 1 tablet (5 mg total) by mouth every 6 (six) hours as needed for nausea or vomiting. 30 tablet 0  . saccharomyces boulardii (FLORASTOR) 250 MG capsule Take 1 capsule (250 mg total) by mouth 2 (two) times daily. 60 capsule  0  . [DISCONTINUED] cetirizine (ZYRTEC) 10 MG tablet Take 10 mg by mouth daily.       No current facility-administered medications for this visit.    Allergies  Allergen Reactions  . Aspirin     REACTION: Intolerance  . Codeine Other (See Comments)    Messes with digestive system  . Niacin Other (See Comments)    Pins sticking into skin  . Nsaids Other (See Comments)    REACTION: Aggravates Digestive System   . Phenergan Fortis [Promethazine] Other (See Comments)    Family and pt states becomes very confused and wild.   . Sulfonamide Derivatives Other (See Comments)    Messes with digestive system    History   Social History  . Marital Status: Married    Spouse Name: N/A  . Number of Children: N/A  . Years of Education: N/A   Occupational History  . Not on file.   Social History Main Topics  . Smoking status: Never Smoker   . Smokeless tobacco: Never Used  . Alcohol Use: No  . Drug Use: No  . Sexual Activity: Not Currently   Other Topics Concern  . Not on file   Social History Narrative     Review of Systems: General: negative for chills, fever, night sweats or weight changes.  Cardiovascular: negative for chest pain, dyspnea on exertion, edema, orthopnea, palpitations, paroxysmal nocturnal dyspnea or shortness of breath Dermatological: negative for rash Respiratory: negative for cough or wheezing Urologic: negative for hematuria Abdominal: negative for nausea, vomiting, diarrhea, bright red blood per rectum, melena, or hematemesis Neurologic: negative for visual changes, syncope, or dizziness All other systems reviewed and are otherwise negative except as noted above.    Blood pressure 120/56, pulse 80, height 5' (1.524 m), weight 142 lb (64.411 kg).  General appearance: alert and no distress Neck: no adenopathy, no JVD, supple, symmetrical, trachea midline, thyroid not enlarged, symmetric, no tenderness/mass/nodules and soft right carotid bruit Lungs:  clear to auscultation bilaterally Heart: regular rate and rhythm, S1, S2 normal, no murmur, click, rub or gallop Extremities: extremities normal, atraumatic, no cyanosis or edema  EKG not performed today  ASSESSMENT AND PLAN:   Bilateral carotid artery disease I am seeing this balding at the request of Dr. Haroldine Laws  for evaluation of high-grade bilateral carotid artery stenosis.she has a history of coronary artery disease status post remote coronary artery bypass grafting and stenting in the past. She also has severe pulmonary hypertension on pulmonary vasodilators. She had carotid Dopplers recently performed on 12/03/14 revealing an occluded left internal carotid artery and high-grade right internal carotid artery stenosis. She is neurologically asymptomatic. She is on antiplatelet therapy. I believe she is high risk for endarterectomy given her pulmonary status as well as being on dual antiplatelet therapy, her carotid disease would be best treated by stenting.       Lorretta Harp MD FACP,FACC,FAHA, [facility] 12/07/2014 4:12 PM

## 2015-01-04 LAB — POCT I-STAT 3, VENOUS BLOOD GAS (G3P V)
Acid-Base Excess: 5 mmol/L — ABNORMAL HIGH (ref 0.0–2.0)
Acid-Base Excess: 5 mmol/L — ABNORMAL HIGH (ref 0.0–2.0)
BICARBONATE: 32 meq/L — AB (ref 20.0–24.0)
Bicarbonate: 31.9 mEq/L — ABNORMAL HIGH (ref 20.0–24.0)
O2 SAT: 45 %
O2 SAT: 47 %
PCO2 VEN: 59.2 mmHg — AB (ref 45.0–50.0)
PH VEN: 7.34 — AB (ref 7.250–7.300)
PH VEN: 7.342 — AB (ref 7.250–7.300)
PO2 VEN: 27 mmHg — AB (ref 30.0–45.0)
TCO2: 34 mmol/L (ref 0–100)
TCO2: 34 mmol/L (ref 0–100)
pCO2, Ven: 59.1 mmHg — ABNORMAL HIGH (ref 45.0–50.0)
pO2, Ven: 28 mmHg — CL (ref 30.0–45.0)

## 2015-01-04 LAB — POCT I-STAT 3, ART BLOOD GAS (G3+)
Acid-Base Excess: 5 mmol/L — ABNORMAL HIGH (ref 0.0–2.0)
BICARBONATE: 30.7 meq/L — AB (ref 20.0–24.0)
O2 Saturation: 89 %
PH ART: 7.365 (ref 7.350–7.450)
PO2 ART: 60 mmHg — AB (ref 80.0–100.0)
TCO2: 32 mmol/L (ref 0–100)
pCO2 arterial: 53.7 mmHg — ABNORMAL HIGH (ref 35.0–45.0)

## 2015-01-05 ENCOUNTER — Encounter: Payer: Self-pay | Admitting: Cardiovascular Disease

## 2015-01-05 ENCOUNTER — Other Ambulatory Visit: Payer: Self-pay | Admitting: *Deleted

## 2015-01-05 ENCOUNTER — Telehealth: Payer: Self-pay | Admitting: Cardiovascular Disease

## 2015-01-05 DIAGNOSIS — I6529 Occlusion and stenosis of unspecified carotid artery: Secondary | ICD-10-CM

## 2015-01-05 NOTE — Telephone Encounter (Signed)
Spoke with Meagan [name] regarding procedure scheduled for 01/25/15 with Dr. Gwenlyn Found and Dr. Trula Slade.  He states he will be there.

## 2015-01-09 ENCOUNTER — Encounter (HOSPITAL_COMMUNITY): Payer: Self-pay

## 2015-01-09 ENCOUNTER — Emergency Department (HOSPITAL_COMMUNITY)
Admission: EM | Admit: 2015-01-09 | Discharge: 2015-01-10 | Disposition: A | Discharge status: Home / Self Care | Payer: Medicare Other | Attending: Emergency Medicine | Admitting: Emergency Medicine

## 2015-01-09 DIAGNOSIS — K219 Gastro-esophageal reflux disease without esophagitis: Secondary | ICD-10-CM | POA: Insufficient documentation

## 2015-01-09 DIAGNOSIS — Z794 Long term (current) use of insulin: Secondary | ICD-10-CM | POA: Insufficient documentation

## 2015-01-09 DIAGNOSIS — Z87448 Personal history of other diseases of urinary system: Secondary | ICD-10-CM | POA: Diagnosis not present

## 2015-01-09 DIAGNOSIS — Z8701 Personal history of pneumonia (recurrent): Secondary | ICD-10-CM | POA: Diagnosis not present

## 2015-01-09 DIAGNOSIS — Z791 Long term (current) use of non-steroidal anti-inflammatories (NSAID): Secondary | ICD-10-CM | POA: Diagnosis not present

## 2015-01-09 DIAGNOSIS — R011 Cardiac murmur, unspecified: Secondary | ICD-10-CM | POA: Insufficient documentation

## 2015-01-09 DIAGNOSIS — Z951 Presence of aortocoronary bypass graft: Secondary | ICD-10-CM | POA: Insufficient documentation

## 2015-01-09 DIAGNOSIS — E1165 Type 2 diabetes mellitus with hyperglycemia: Secondary | ICD-10-CM | POA: Insufficient documentation

## 2015-01-09 DIAGNOSIS — Z7902 Long term (current) use of antithrombotics/antiplatelets: Secondary | ICD-10-CM | POA: Diagnosis not present

## 2015-01-09 DIAGNOSIS — M797 Fibromyalgia: Secondary | ICD-10-CM | POA: Insufficient documentation

## 2015-01-09 DIAGNOSIS — D649 Anemia, unspecified: Secondary | ICD-10-CM | POA: Insufficient documentation

## 2015-01-09 DIAGNOSIS — Z9889 Other specified postprocedural states: Secondary | ICD-10-CM | POA: Insufficient documentation

## 2015-01-09 DIAGNOSIS — Z79899 Other long term (current) drug therapy: Secondary | ICD-10-CM | POA: Insufficient documentation

## 2015-01-09 DIAGNOSIS — J45901 Unspecified asthma with (acute) exacerbation: Secondary | ICD-10-CM | POA: Diagnosis not present

## 2015-01-09 DIAGNOSIS — I2510 Atherosclerotic heart disease of native coronary artery without angina pectoris: Secondary | ICD-10-CM | POA: Insufficient documentation

## 2015-01-09 DIAGNOSIS — I10 Essential (primary) hypertension: Secondary | ICD-10-CM | POA: Insufficient documentation

## 2015-01-09 DIAGNOSIS — E039 Hypothyroidism, unspecified: Secondary | ICD-10-CM | POA: Diagnosis not present

## 2015-01-09 DIAGNOSIS — Z9981 Dependence on supplemental oxygen: Secondary | ICD-10-CM | POA: Insufficient documentation

## 2015-01-09 DIAGNOSIS — I5032 Chronic diastolic (congestive) heart failure: Secondary | ICD-10-CM | POA: Insufficient documentation

## 2015-01-09 DIAGNOSIS — Z9861 Coronary angioplasty status: Secondary | ICD-10-CM | POA: Diagnosis not present

## 2015-01-09 DIAGNOSIS — Z8614 Personal history of Methicillin resistant Staphylococcus aureus infection: Secondary | ICD-10-CM | POA: Diagnosis not present

## 2015-01-09 DIAGNOSIS — R739 Hyperglycemia, unspecified: Secondary | ICD-10-CM

## 2015-01-09 DIAGNOSIS — F419 Anxiety disorder, unspecified: Secondary | ICD-10-CM | POA: Insufficient documentation

## 2015-01-09 LAB — URINE MICROSCOPIC-ADD ON

## 2015-01-09 LAB — CBC WITH DIFFERENTIAL/PLATELET
Basophils Absolute: 0 10*3/uL (ref 0.0–0.1)
Basophils Relative: 0 % (ref 0–1)
Eosinophils Absolute: 0.2 10*3/uL (ref 0.0–0.7)
Eosinophils Relative: 1 % (ref 0–5)
HCT: 25.5 % — ABNORMAL LOW (ref 36.0–46.0)
Hemoglobin: 8.3 g/dL — ABNORMAL LOW (ref 12.0–15.0)
Lymphocytes Relative: 4 % — ABNORMAL LOW (ref 12–46)
Lymphs Abs: 0.6 10*3/uL — ABNORMAL LOW (ref 0.7–4.0)
MCH: 26.8 pg (ref 26.0–34.0)
MCHC: 32.5 g/dL (ref 30.0–36.0)
MCV: 82.3 fL (ref 78.0–100.0)
Monocytes Absolute: 1.4 10*3/uL — ABNORMAL HIGH (ref 0.1–1.0)
Monocytes Relative: 9 % (ref 3–12)
Neutro Abs: 13.3 10*3/uL — ABNORMAL HIGH (ref 1.7–7.7)
Neutrophils Relative %: 86 % — ABNORMAL HIGH (ref 43–77)
PLATELETS: 347 10*3/uL (ref 150–400)
RBC: 3.1 MIL/uL — AB (ref 3.87–5.11)
RDW: 14.9 % (ref 11.5–15.5)
WBC: 15.4 10*3/uL — ABNORMAL HIGH (ref 4.0–10.5)

## 2015-01-09 LAB — COMPREHENSIVE METABOLIC PANEL
ALBUMIN: 3.3 g/dL — AB (ref 3.5–5.2)
ALT: 17 U/L (ref 0–35)
AST: 17 U/L (ref 0–37)
Alkaline Phosphatase: 135 U/L — ABNORMAL HIGH (ref 39–117)
Anion gap: 9 (ref 5–15)
BILIRUBIN TOTAL: 0.5 mg/dL (ref 0.3–1.2)
BUN: 45 mg/dL — ABNORMAL HIGH (ref 6–23)
CALCIUM: 8.8 mg/dL (ref 8.4–10.5)
CHLORIDE: 95 mmol/L — AB (ref 96–112)
CO2: 28 mmol/L (ref 19–32)
Creatinine, Ser: 1.79 mg/dL — ABNORMAL HIGH (ref 0.50–1.10)
GFR calc Af Amer: 32 mL/min — ABNORMAL LOW (ref >90)
GFR, EST NON AFRICAN AMERICAN: 28 mL/min — AB (ref >90)
Glucose, Bld: 182 mg/dL — ABNORMAL HIGH (ref 70–99)
Potassium: 4.3 mmol/L (ref 3.5–5.1)
SODIUM: 132 mmol/L — AB (ref 135–145)
Total Protein: 7.2 g/dL (ref 6.0–8.3)

## 2015-01-09 LAB — URINALYSIS, ROUTINE W REFLEX MICROSCOPIC
Bilirubin Urine: NEGATIVE
Glucose, UA: NEGATIVE mg/dL
Ketones, ur: NEGATIVE mg/dL
Leukocytes, UA: NEGATIVE
Nitrite: NEGATIVE
PH: 5.5 (ref 5.0–8.0)
Protein, ur: NEGATIVE mg/dL
SPECIFIC GRAVITY, URINE: 1.015 (ref 1.005–1.030)
Urobilinogen, UA: 0.2 mg/dL (ref 0.0–1.0)

## 2015-01-09 LAB — CBG MONITORING, ED: GLUCOSE-CAPILLARY: 184 mg/dL — AB (ref 70–99)

## 2015-01-09 MED ORDER — SODIUM CHLORIDE 0.9 % IV SOLN
INTRAVENOUS | Status: DC
  Administered 2015-01-09: 500 mL via INTRAVENOUS

## 2015-01-09 MED ORDER — SODIUM CHLORIDE 0.9 % IV BOLUS (SEPSIS)
500.0000 mL | Freq: Once | INTRAVENOUS | Status: AC
  Administered 2015-01-09: 500 mL via INTRAVENOUS

## 2015-01-09 NOTE — ED Provider Notes (Signed)
CSN: [id number]     Arrival date & time 01/09/15  2118 History  This chart was scribed for Fredia Sorrow, MD by Peyton Bottoms, ED Scribe. This patient was seen in room APA06/APA06 and the patient's care was started at 11:13 PM.   Chief Complaint  Patient presents with  . Hyperglycemia   Patient is a 69 y.o. female presenting with hyperglycemia. The history is provided by the patient. No language interpreter was used.  Hyperglycemia Blood sugar level PTA:  500 Severity:  Moderate Onset quality:  Sudden Progression:  Worsening Chronicity:  New Diabetes status:  Controlled with insulin and controlled with oral medications Relieved by:  Nothing Ineffective treatments:  None tried Associated symptoms: nausea and shortness of breath   Associated symptoms: no abdominal pain, no chest pain, no dysuria and no vomiting    HPI Comments: ESTLE SABELLA is a 69 y.o. female with a PMHx of fibromyalgia, hypercholesterolemia, hypertension, PE, ARF, restrictive lung disease, renal insufficiency, CAD, MRSA, pulmonary hypertension, pancreatic mass, PONV, heart murmur, CHF, anginal pain, pneumonia, diabetes, GERD and hypothyroidism, who presents to the Emergency Department complaining of moderate hyperglycemia. She denies taking any medications prior to arriving to ED. She reports associated lethargy and increased amounts of sleeping today. Per husband, patient's blood glucose level was measured to be 154 in the morning, 200 in the afternoon and over 500 at home at 8 PM. Her BG was measured to be 185 in ED. Patient reports associated chills. Patient is suspicious that her meter may be broken. She states she had a hip replacement in 07/2014. She states she had onset of decreased appetite and nausea. She states she took zofran with no relief. Patient states that if the blood glucose meter did no show such an elevated number, she would not have come into ED today.   Past Medical History  Diagnosis Date  .  Fibromyalgia   . Hypercholesterolemia   . Hypertension     pulmonary  pap 110/31 (mean 62)by [facility] 9/10 negative PE by chest CT 2010  . Pulmonary edema     Failed Revatio   . ARF (acute renal failure)     poor candidate for ACE -I or ARB  . Restrictive lung disease     obesity hypoventilation syndrome  . Renal insufficiency   . CAD (coronary artery disease)     multivessel DES x 2 RCA 2007  . Chronic diastolic heart failure   . MRSA (methicillin resistant staphylococcus aureus) pneumonia   . Pulmonary hypertension   . Chronic anemia     anemia of chronic disease based on anemia panel 2011  . Pancreatic mass 08/01/2012  . PONV (postoperative nausea and vomiting)   . Heart murmur     "when I was small"  . CHF (congestive heart failure)   . Anginal pain   . Pneumonia X 3  . Type II diabetes mellitus   . Hypothyroidism   . H/O hiatal hernia   . GERD (gastroesophageal reflux disease)   . Anxiety   . On home oxygen therapy     "6L all the time" (02/24/2014  . Carotid artery disease    Past Surgical History  Procedure Laterality Date  . Coronary artery bypass graft  1997    LIMA to LAD,SVG to OM  . Esophagogastroduodenoscopy  06/28/10    schatzki ring otherwise normal;small hiatal hernia  . Coronary angioplasty  1993  . Coronary angioplasty with stent placement  ~ 1996; 2008    "  think I have 3 stents"  . Abdominal hysterectomy  ~ 1986  . Dilation and curettage of uterus  ~ 1984  . Tubal ligation    . Esophagogastroduodenoscopy N/A 06/06/2014    Procedure: ESOPHAGOGASTRODUODENOSCOPY (EGD);  Surgeon: Lafayette Dragon, MD;  Location[facility] ENDOSCOPY;  Service: Endoscopy;  Laterality: N/A;  . Hip arthroplasty Right 07/29/2014    Procedure: ARTHROPLASTY BIPOLAR HIP;  Surgeon: Mcarthur Rossetti, MD;  Location: [place];  Service: Orthopedics;  Laterality: Right;  . Left and right heart catheterization with coronary/graft angiogram N/A 01/21/2013    Procedure: LEFT AND RIGHT HEART  CATHETERIZATION WITH Beatrix Fetters;  Surgeon: Jolaine Artist, MD;  Location: [facility] CATH LAB;  Service: Cardiovascular;  Laterality: N/A;  . Left and right heart catheterization with coronary/graft angiogram N/A 02/24/2014    Procedure: LEFT AND RIGHT HEART CATHETERIZATION WITH Beatrix Fetters;  Surgeon: Jolaine Artist, MD;  Location: [facility] CATH LAB;  Service: Cardiovascular;  Laterality: N/A;  . Left heart catheterization with coronary angiogram N/A 01/03/2015    Procedure: LEFT HEART CATHETERIZATION WITH CORONARY ANGIOGRAM;  Surgeon: Jolaine Artist, MD;  Location: [facility] CATH LAB;  Service: Cardiovascular;  Laterality: N/A;   Family History  Problem Relation Age of Onset  . Cancer Mother     oral  . Heart attack Father   . COPD Brother   . Heart disease Brother    History  Substance Use Topics  . Smoking status: Never Smoker   . Smokeless tobacco: Never Used  . Alcohol Use: No   OB History    No data available     Review of Systems  Constitutional: Positive for chills.  HENT: Negative for congestion, rhinorrhea and sore throat.   Eyes: Negative for visual disturbance.  Respiratory: Positive for shortness of breath.   Cardiovascular: Negative for chest pain and leg swelling.  Gastrointestinal: Positive for nausea and constipation. Negative for vomiting, abdominal pain and diarrhea.  Genitourinary: Negative for dysuria.  Musculoskeletal: Positive for myalgias.  Skin: Negative for rash.  Neurological: Positive for headaches.  Hematological: Does not bruise/bleed easily.   Allergies  Aspirin; Codeine; Niacin; Nsaids; Phenergan fortis; and Sulfonamide derivatives  Home Medications   Prior to Admission medications   Medication Sig Start Date End Date Taking? Authorizing Provider  ALPRAZolam Duanne Moron) 0.5 MG tablet Take 2 tablets (1 mg total) by mouth at bedtime. For sleep. *Prescribed one tablet three times daily as needed* 08/17/14   Lavon Paganini Angiulli, PA-C   clopidogrel (PLAVIX) 75 MG tablet Take 1 tablet (75 mg total) by mouth daily. 10/20/14   Larey Dresser, MD  diclofenac sodium (VOLTAREN) 1 % GEL Apply 2 g topically 4 (four) times daily. 08/17/14   Lavon Paganini Angiulli, PA-C  DULoxetine (CYMBALTA) 30 MG capsule Take 1 capsule (30 mg total) by mouth at bedtime. 08/17/14   Lavon Paganini Angiulli, PA-C  DULoxetine (CYMBALTA) 60 MG capsule Take 1 capsule (60 mg total) by mouth every morning. 08/17/14   Lavon Paganini Angiulli, PA-C  Epoprostenol Sodium (FLOLAN IV) Inject into the vein. 1.5 mg. Inject into the vein continuously. Infuse mg/kg/min via cont IV pump.    Historical Provider, MD  ezetimibe-simvastatin (VYTORIN) 10-40 MG per tablet Take 1 tablet by mouth every evening. 08/17/14   Lavon Paganini Angiulli, PA-C  furosemide (LASIX) 40 MG tablet Take 2 tablets (80 mg total) by mouth daily. 08/17/14   Lavon Paganini Angiulli, PA-C  HYDROmorphone (DILAUDID) 2 MG tablet Take 2 tablets (4 mg total)  by mouth every 6 (six) hours as needed for severe pain. 08/03/14   Domenic Polite, MD  HYDROmorphone (DILAUDID) 4 MG tablet Take 8 mg by mouth every 6 (six) hours as needed for moderate pain or severe pain.    Historical Provider, MD  insulin glargine (LANTUS) 100 UNIT/ML injection Inject 0.08 mLs (8 Units total) into the skin at bedtime. Patient taking differently: Inject 8 Units into the skin daily.  08/17/14   Lavon Paganini Angiulli, PA-C  Insulin Glargine (LANTUS) 100 UNIT/ML Solostar Pen Inject 50 Units into the skin daily.    Historical Provider, MD  isosorbide mononitrate (IMDUR) 30 MG 24 hr tablet Take 30 mg by mouth 2 (two) times daily.    Historical Provider, MD  isosorbide mononitrate (IMDUR) 60 MG 24 hr tablet Take 1 tablet (60 mg total) by mouth 2 (two) times daily. 11/17/14   Jolaine Artist, MD  lansoprazole (PREVACID) 15 MG capsule Take 1 capsule (15 mg total) by mouth daily at 12 noon. 11/17/14   Amy D Clegg, NP  levothyroxine (SYNTHROID, LEVOTHROID) 25 MCG tablet Take  1 tablet (25 mcg total) by mouth every morning. 08/17/14   Lavon Paganini Angiulli, PA-C  metoprolol succinate (TOPROL-XL) 100 MG 24 hr tablet Take 100 mg by mouth daily. Take with or immediately following a meal.    Historical Provider, MD  metoprolol succinate (TOPROL-XL) 50 MG 24 hr tablet Take 1 tablet (50 mg total) by mouth daily. Take with or immediately following a meal. Patient taking differently: Take 25 mg by mouth daily. Take with or immediately following a meal. 08/17/14   Lavon Paganini Angiulli, PA-C  nitroGLYCERIN (NITROSTAT) 0.4 MG SL tablet Place 0.4 mg under the tongue every 5 (five) minutes as needed for chest pain.     Historical Provider, MD  omeprazole (PRILOSEC) 20 MG capsule Take 20 mg by mouth 2 (two) times daily before a meal.    Historical Provider, MD  ondansetron (ZOFRAN) 8 MG tablet Take 8 mg by mouth 2 (two) times daily. For nausea    Historical Provider, MD  Oxygen Permeable Lens Products SOLN Inhale into the lungs continuous. 6 liters 12/31/11   Jolaine Artist, MD  OXYGEN Inhale 6 L into the lungs continuous.    Historical Provider, MD  Oxymetazoline HCl (NASAL SPRAY) 0.05 % SOLN Place 1 spray into the nose as needed (for congestion).    Historical Provider, MD  potassium chloride SA (K-DUR,KLOR-CON) 10 MEQ tablet Take 4 tablets (40 mEq total) by mouth daily. 10/20/14   Larey Dresser, MD  Probiotic Product (FLORAJEN3 PO) Take 1 tablet by mouth daily.    Historical Provider, MD  prochlorperazine (COMPAZINE) 5 MG tablet Take 1 tablet (5 mg total) by mouth every 6 (six) hours as needed for nausea or vomiting. 08/17/14   Lavon Paganini Angiulli, PA-C  saccharomyces boulardii (FLORASTOR) 250 MG capsule Take 1 capsule (250 mg total) by mouth 2 (two) times daily. 08/17/14   Lavon Paganini Angiulli, PA-C  traMADol (ULTRAM) 50 MG tablet Take 50 mg by mouth every 6 (six) hours as needed for moderate pain.    Historical Provider, MD   Triage Vitals: BP 108/56 mmHg  Pulse 83  Temp(Src) 99.2 F  (37.3 C) (Rectal)  Resp 23  SpO2 89%  Physical Exam  Constitutional: She is oriented to person, place, and time. She appears well-developed and well-nourished.  HENT:  Head: Normocephalic and atraumatic.  Mouth/Throat: Oropharynx is clear and moist. No oropharyngeal exudate.  Eyes: EOM are normal. Pupils are equal, round, and reactive to light. No scleral icterus.  Cardiovascular: Normal rate, regular rhythm and normal heart sounds.   Pulmonary/Chest: Effort normal and breath sounds normal. No respiratory distress.  Abdominal: Soft. Bowel sounds are normal. There is no tenderness.  Musculoskeletal: She exhibits no edema.  Neurological: She is alert and oriented to person, place, and time. No cranial nerve deficit. She exhibits normal muscle tone. Coordination normal.  Psychiatric: She has a normal mood and affect.  Nursing note and vitals reviewed.  ED Course  Procedures (including critical care time)  DIAGNOSTIC STUDIES: Oxygen Saturation is 89% on RA, low by my interpretation.    COORDINATION OF CARE: 11:25 PM- Discussed plans to order diagnostic EKG, urinalysis and lab work. Pt advised of plan for treatment and pt agrees.  Labs Review Labs Reviewed  CBC WITH DIFFERENTIAL/PLATELET - Abnormal; Notable for the following:    WBC 15.4 (*)    RBC 3.10 (*)    Hemoglobin 8.3 (*)    HCT 25.5 (*)    Neutrophils Relative % 86 (*)    Neutro Abs 13.3 (*)    Lymphocytes Relative 4 (*)    Lymphs Abs 0.6 (*)    Monocytes Absolute 1.4 (*)    All other components within normal limits  COMPREHENSIVE METABOLIC PANEL - Abnormal; Notable for the following:    Sodium 132 (*)    Chloride 95 (*)    Glucose, Bld 182 (*)    BUN 45 (*)    Creatinine, Ser 1.79 (*)    Albumin 3.3 (*)    Alkaline Phosphatase 135 (*)    GFR calc non Af Amer 28 (*)    GFR calc Af Amer 32 (*)    All other components within normal limits  URINALYSIS, ROUTINE W REFLEX MICROSCOPIC - Abnormal; Notable for the  following:    Hgb urine dipstick TRACE (*)    All other components within normal limits  CBG MONITORING, ED - Abnormal; Notable for the following:    Glucose-Capillary 184 (*)    All other components within normal limits  URINE MICROSCOPIC-ADD ON   Imaging Review No results found.   EKG Interpretation   Date/Time:  Sunday January 09 2015 21:33:39 EDT Ventricular Rate:  84 PR Interval:  137 QRS Duration: 146 QT Interval:  422 QTC Calculation: 499 R Axis:   50 Text Interpretation:  Sinus rhythm Right bundle branch block No  significant change was found Confirmed by Wyvonnia Dusky  MD, STEPHEN [phone]) on  01/09/2015 9:44:27 PM Also confirmed by Rogene Houston  MD, Libero Puthoff [phone])  on  01/09/2015 11:10:15 PM     MDM   Final diagnoses:  Hyperglycemia  Anemia, unspecified anemia type    The patient only concern was the elevated blood sugar at home. That self corrected here. Patient without any new or worse symptoms. Patient informed of a borderline anemia close to needing blood transfusion. Patient will follow-up with her doctor in the next week or 2 to have this checked. As stated patient had no other concerns. Not clear exactly why the blood sugar improved so much some concerns for questionable faulty monitor.  I personally performed the services described in this documentation, which was scribed in my presence. The recorded information has been reviewed and is accurate.    Fredia Sorrow, MD 01/10/15 [phone]

## 2015-01-09 NOTE — ED Notes (Signed)
Didn't "feel good" yesterday, husband noted that pt has been lethargic and sleeping all lot today. Suspected a blood sugar issue and took her BG frequently. Noted BG over 500 @ 8pm. Now pt "freezing"

## 2015-01-09 NOTE — ED Notes (Signed)
cbg of 184

## 2015-01-10 NOTE — ED Notes (Signed)
Pt alert & oriented x4, stable gait. Patient  given discharge instructions, paperwork & prescription(s).  Patient verbalized understanding. Pt left department in wheelchair w/ no further questions. 

## 2015-01-10 NOTE — Discharge Instructions (Signed)
Follow-up with your doctor in the next week or 2 to have your of blood counts checked again. As we discussed the borderline anemia. Return for any new or worse symptoms.

## 2015-01-11 ENCOUNTER — Telehealth: Payer: Self-pay | Admitting: Cardiovascular Disease

## 2015-01-11 NOTE — Telephone Encounter (Signed)
Spoke with patient regarding carotid stent scheduled for 01/25/15.  Scheduled for 11:00 am  Arrival time is 9:00 am at short stay center at [facility].  NPO after midnight---Do not take your insulin the morning of your procedure---you may take your remaining medications with water, including your Plavix.  Have your blood work done 01/20/15 and plan to spend the night.  You will needs someone to drive your home.  I told the patient I would mail this information to her.  She voiced her understanding.

## 2015-01-18 ENCOUNTER — Encounter (HOSPITAL_COMMUNITY): Payer: Medicare Other

## 2015-01-21 ENCOUNTER — Ambulatory Visit (HOSPITAL_COMMUNITY)
Admission: RE | Admit: 2015-01-21 | Discharge: 2015-01-21 | Disposition: A | Discharge status: Home / Self Care | Payer: Medicare Other | Source: Ambulatory Visit | Attending: Cardiovascular Disease | Admitting: Cardiovascular Disease

## 2015-01-21 DIAGNOSIS — R5383 Other fatigue: Secondary | ICD-10-CM

## 2015-01-21 DIAGNOSIS — I739 Peripheral vascular disease, unspecified: Secondary | ICD-10-CM

## 2015-01-21 DIAGNOSIS — D689 Coagulation defect, unspecified: Secondary | ICD-10-CM

## 2015-01-21 DIAGNOSIS — I509 Heart failure, unspecified: Secondary | ICD-10-CM | POA: Insufficient documentation

## 2015-01-21 DIAGNOSIS — Z01818 Encounter for other preprocedural examination: Secondary | ICD-10-CM | POA: Diagnosis present

## 2015-01-21 DIAGNOSIS — E119 Type 2 diabetes mellitus without complications: Secondary | ICD-10-CM | POA: Insufficient documentation

## 2015-01-21 DIAGNOSIS — I779 Disorder of arteries and arterioles, unspecified: Secondary | ICD-10-CM

## 2015-01-21 LAB — CBC
HEMATOCRIT: 25.3 % — AB (ref 36.0–46.0)
Hemoglobin: 7.9 g/dL — ABNORMAL LOW (ref 12.0–15.0)
MCH: 25.9 pg — ABNORMAL LOW (ref 26.0–34.0)
MCHC: 31.2 g/dL (ref 30.0–36.0)
MCV: 83 fL (ref 78.0–100.0)
MPV: 8.7 fL (ref 8.6–12.4)
PLATELETS: 358 10*3/uL (ref 150–400)
RBC: 3.05 MIL/uL — ABNORMAL LOW (ref 3.87–5.11)
RDW: 14.9 % (ref 11.5–15.5)
WBC: 11.4 10*3/uL — ABNORMAL HIGH (ref 4.0–10.5)

## 2015-01-22 LAB — BASIC METABOLIC PANEL
BUN: 30 mg/dL — AB (ref 6–23)
CHLORIDE: 95 meq/L — AB (ref 96–112)
CO2: 33 meq/L — AB (ref 19–32)
Calcium: 8.9 mg/dL (ref 8.4–10.5)
Creat: 1.09 mg/dL (ref 0.50–1.10)
GLUCOSE: 138 mg/dL — AB (ref 70–99)
Potassium: 5.6 mEq/L — ABNORMAL HIGH (ref 3.5–5.3)
Sodium: 138 mEq/L (ref 135–145)

## 2015-01-22 LAB — APTT: aPTT: 50 seconds — ABNORMAL HIGH (ref 24–37)

## 2015-01-22 LAB — PROTIME-INR
INR: 1.05 (ref <1.50)
PROTHROMBIN TIME: 13.7 s (ref 11.6–15.2)

## 2015-01-22 LAB — TSH: TSH: 0.642 u[IU]/mL (ref 0.350–4.500)

## 2015-01-24 ENCOUNTER — Other Ambulatory Visit: Payer: Self-pay | Admitting: *Deleted

## 2015-01-25 ENCOUNTER — Encounter (HOSPITAL_COMMUNITY): Payer: Self-pay | Admitting: Cardiovascular Disease

## 2015-01-25 ENCOUNTER — Ambulatory Visit (HOSPITAL_COMMUNITY)
Admission: RE | Admit: 2015-01-25 | Discharge: 2015-01-25 | Disposition: A | Discharge status: Home / Self Care | Payer: Medicare Other | Source: Ambulatory Visit | Attending: Cardiovascular Disease | Admitting: Cardiovascular Disease

## 2015-01-25 ENCOUNTER — Encounter (HOSPITAL_COMMUNITY)
Admission: RE | Disposition: A | Discharge status: Home / Self Care | Payer: Self-pay | Source: Ambulatory Visit | Attending: Cardiovascular Disease

## 2015-01-25 DIAGNOSIS — Z888 Allergy status to other drugs, medicaments and biological substances status: Secondary | ICD-10-CM | POA: Insufficient documentation

## 2015-01-25 DIAGNOSIS — I779 Disorder of arteries and arterioles, unspecified: Secondary | ICD-10-CM | POA: Diagnosis present

## 2015-01-25 DIAGNOSIS — I2510 Atherosclerotic heart disease of native coronary artery without angina pectoris: Secondary | ICD-10-CM | POA: Diagnosis not present

## 2015-01-25 DIAGNOSIS — I272 Other secondary pulmonary hypertension: Secondary | ICD-10-CM | POA: Insufficient documentation

## 2015-01-25 DIAGNOSIS — I6521 Occlusion and stenosis of right carotid artery: Secondary | ICD-10-CM

## 2015-01-25 DIAGNOSIS — I739 Peripheral vascular disease, unspecified: Secondary | ICD-10-CM

## 2015-01-25 DIAGNOSIS — I6523 Occlusion and stenosis of bilateral carotid arteries: Secondary | ICD-10-CM | POA: Insufficient documentation

## 2015-01-25 DIAGNOSIS — Z951 Presence of aortocoronary bypass graft: Secondary | ICD-10-CM | POA: Diagnosis not present

## 2015-01-25 DIAGNOSIS — E119 Type 2 diabetes mellitus without complications: Secondary | ICD-10-CM | POA: Diagnosis not present

## 2015-01-25 DIAGNOSIS — Z886 Allergy status to analgesic agent status: Secondary | ICD-10-CM | POA: Insufficient documentation

## 2015-01-25 DIAGNOSIS — Z880 Allergy status to penicillin: Secondary | ICD-10-CM | POA: Diagnosis not present

## 2015-01-25 DIAGNOSIS — I2584 Coronary atherosclerosis due to calcified coronary lesion: Secondary | ICD-10-CM | POA: Diagnosis not present

## 2015-01-25 DIAGNOSIS — I6529 Occlusion and stenosis of unspecified carotid artery: Secondary | ICD-10-CM

## 2015-01-25 DIAGNOSIS — Z794 Long term (current) use of insulin: Secondary | ICD-10-CM | POA: Insufficient documentation

## 2015-01-25 DIAGNOSIS — R079 Chest pain, unspecified: Secondary | ICD-10-CM | POA: Diagnosis present

## 2015-01-25 HISTORY — PX: ARCH AORTOGRAM: SHX5501

## 2015-01-25 LAB — CBC
HCT: 29 % — ABNORMAL LOW (ref 36.0–46.0)
Hemoglobin: 9 g/dL — ABNORMAL LOW (ref 12.0–15.0)
MCH: 26 pg (ref 26.0–34.0)
MCHC: 31 g/dL (ref 30.0–36.0)
MCV: 83.8 fL (ref 78.0–100.0)
Platelets: 361 10*3/uL (ref 150–400)
RBC: 3.46 MIL/uL — AB (ref 3.87–5.11)
RDW: 14.7 % (ref 11.5–15.5)
WBC: 17.6 10*3/uL — ABNORMAL HIGH (ref 4.0–10.5)

## 2015-01-25 LAB — GLUCOSE, CAPILLARY
GLUCOSE-CAPILLARY: 151 mg/dL — AB (ref 70–99)
Glucose-Capillary: 91 mg/dL (ref 70–99)
Glucose-Capillary: 64 mg/dL — ABNORMAL LOW (ref 70–99)
Glucose-Capillary: 60 mg/dL — ABNORMAL LOW (ref 70–99)

## 2015-01-25 LAB — POTASSIUM: POTASSIUM: 4.5 mmol/L (ref 3.5–5.1)

## 2015-01-25 SURGERY — CAROTID ANGIOGRAM
Laterality: Right

## 2015-01-25 MED ORDER — ONDANSETRON HCL 4 MG/2ML IJ SOLN: 4.0000 mg | Freq: Four times a day (QID) | INTRAMUSCULAR | Status: DC | PRN

## 2015-01-25 MED ORDER — ACETAMINOPHEN 325 MG PO TABS
650.0000 mg | ORAL_TABLET | ORAL | Status: DC | PRN
  Administered 2015-01-25: 650 mg via ORAL

## 2015-01-25 MED ORDER — ACETAMINOPHEN 325 MG PO TABS
ORAL_TABLET | ORAL | Status: AC
  Administered 2015-01-25: 650 mg via ORAL
  Filled 2015-01-25: qty 1

## 2015-01-25 MED ORDER — HEPARIN (PORCINE) IN NACL 2-0.9 UNIT/ML-% IJ SOLN
INTRAMUSCULAR | Status: AC
  Filled 2015-01-25: qty 1000

## 2015-01-25 MED ORDER — CLOPIDOGREL BISULFATE 75 MG PO TABS: 75.0000 mg | ORAL_TABLET | Freq: Every day | ORAL | Status: DC

## 2015-01-25 MED ORDER — LIDOCAINE HCL (PF) 1 % IJ SOLN
INTRAMUSCULAR | Status: AC
  Filled 2015-01-25: qty 30

## 2015-01-25 MED ORDER — SODIUM CHLORIDE 0.9 % IJ SOLN: 3.0000 mL | INTRAMUSCULAR | Status: DC | PRN

## 2015-01-25 MED ORDER — ACETAMINOPHEN 325 MG PO TABS
ORAL_TABLET | ORAL | Status: AC
  Filled 2015-01-25: qty 1

## 2015-01-25 MED ORDER — SODIUM CHLORIDE 0.9 % IV SOLN
INTRAVENOUS | Status: DC
  Administered 2015-01-25: 10:00:00 via INTRAVENOUS

## 2015-01-25 MED ORDER — SODIUM CHLORIDE 0.9 % IV SOLN: INTRAVENOUS | Status: AC

## 2015-01-25 NOTE — CV Procedure (Signed)
Meagan [name] is a 69 y.o. female    [id number] LOCATION:  FACILITY: [facility]  PHYSICIAN: Quay Burow, M.D. 1946/06/14   DATE OF PROCEDURE:  01/25/2015  DATE OF DISCHARGE:     PV Angiogram/Intervention    History obtained from chart review.Meagan [name] is a 69 year old moderately overweight married Caucasian female with no children is accompanied by her husband today. Her cardiologist is Dr. Haroldine Laws and CAD. for pulmonary hypertension. She has a history of coronary artery bypass grafting and subsequent stenting. Her problems include severe pulmonary hypertension. She has treated hypertension, hyperlipidemia and diabetes. Because of jaw pain she underwent Myoview stress testing on 12/03/14 revealing inferolateral ischemia. 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 She is neurologically a symptomatically on Plavix. I believe she is high risk to undergo endarterectomy because of her severe hypertension and pulmonary issues as well as the fact that she has occluded left internal carotid artery. She is best suited for coronary artery stenting. Based on a positive Myoview, she underwent cardiac catheterization by Dr. Jeffie Pollock revealing unchanged coronary anatomy. She presents today for cerebral angiography to determine the extent of stenosis of her right internal carotid artery given her left internal carotid artery occlusion.   PROCEDURE DESCRIPTION:   The patient was brought to the second floor [facility] cath lab in the postabsorptive state. She was not premedicated . Her right groinwas prepped and shaved in usual sterile fashion. Xylocaine 1% was used for local anesthesia. A 5 French sheath was inserted into the right common femoral artery using standard Seldinger technique. A 5 French pigtail catheter was placed in the aortic arch and arch angiography was performed. A 5 French JB1 catheter was used to perform selective right  carotid angiography. There is a podiatrist for the entirety of the case (80 mL total to patient). Retrograde aortic pressure was monitored during the case.   HEMODYNAMICS:    AO SYSTOLIC/AO DIASTOLIC: 329/51   Angiographic Data:   1: Arch aortogram-type II arch, the left internal carotid artery appeared occluded on the arch aortogram  2: Right carotid artery-the JB1  catheter was placed in the right common carotid artery over at 035 wire. Intra-and extracranial views were obtained. The intracranial views will be interpreted by interventional neuro radiology. The RAO 61 and lateral views revealed the origin of the right internal carotid artery to be only 60-70% stenosed. This was reviewed with Dr. Trula Slade who agreed with the interpretation.  IMPRESSION:known occluded left ICA stenosis by ultrasound confirmed by angiography with a moderate right ICA stenosis in the 60s to 70% range. Given the fact that the patient is asymptomatic I do not feel the degree of stenosis warrants intervention at this time. We will continue to follow her by Ultrasound in the office every 6 months. The sheath was removed and pressure held on the groin to achieve hemostasis. The patient left the lab in stable condition. She'll be gently hydrated and discharged home. I will see her in the office in 2-3 weeks for follow-up.   Lorretta Harp MD, [facility] 01/25/2015 12:59 PM

## 2015-01-25 NOTE — Progress Notes (Addendum)
21fr sheath aspirated and removed from rfa , manual pressure applied for 20 minutes. Groin level 0 , no s+s of hematoma. Tegaderm dressing applied, bedrest instructions given. Bilateral dp pulses weakly palpable.   Bedrest   Begins at 13:45:00   CBG 64 at 1316,  Patient refused orange juice, coke (non diet) given.

## 2015-01-25 NOTE — H&P (Signed)
Meagan [name]  1946/07/12  [id number]  Primary Physician Glo Herring., MD Primary Cardiologist: Lorretta Harp MD Renae Gloss   HPI: Miss Done is a 69 year old moderately overweight married Caucasian female with no children is accompanied by her husband today. Her cardiologist is Dr. Haroldine Laws and CAD. for pulmonary hypertension. She has a history of coronary artery bypass grafting and subsequent stenting. Her problems include severe pulmonary hypertension. She has treated hypertension, hyperlipidemia and diabetes. Because of jaw pain she underwent Myoview stress testing on 12/03/14 revealing inferolateral ischemia. 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 She is neurologically a symptomatically on Plavix. I believe she is high risk to undergo endarterectomy because of her severe hypertension and pulmonary issues as well as the fact that she has occluded left internal carotid artery. She is best suited for coronary artery stenting. Prior to this however she will need cardiac catheterization to define her coronary anatomy given her jaw pain and positive Myoview stress test.   Current Outpatient Prescriptions  Medication Sig Dispense Refill  . ALPRAZolam (XANAX) 0.5 MG tablet Take 2 tablets (1 mg total) by mouth at bedtime. For sleep. *Prescribed one tablet three times daily as needed* 30 tablet 0  . clopidogrel (PLAVIX) 75 MG tablet Take 1 tablet (75 mg total) by mouth daily. 90 tablet 3  . diclofenac sodium (VOLTAREN) 1 % GEL Apply 2 g topically 4 (four) times daily. 1 Tube 1  . DULoxetine (CYMBALTA) 30 MG capsule Take 1 capsule (30 mg total) by mouth at bedtime. 30 capsule 3  . DULoxetine (CYMBALTA) 60 MG capsule Take 1 capsule (60 mg total) by mouth every morning. 30 capsule 3  . Epoprostenol Sodium (FLOLAN IV) Inject into the vein. 1.5 mg. Inject into the vein  continuously. Infuse mg/kg/min via cont IV pump.    Marland Kitchen ezetimibe-simvastatin (VYTORIN) 10-40 MG per tablet Take 1 tablet by mouth every evening. 30 tablet 1  . furosemide (LASIX) 40 MG tablet Take 2 tablets (80 mg total) by mouth daily. 30 tablet 1  . HYDROmorphone (DILAUDID) 2 MG tablet Take 2 tablets (4 mg total) by mouth every 6 (six) hours as needed for severe pain. 30 tablet 0  . insulin glargine (LANTUS) 100 UNIT/ML injection Inject 0.08 mLs (8 Units total) into the skin at bedtime. 10 mL 11  . isosorbide mononitrate (IMDUR) 60 MG 24 hr tablet Take 1 tablet (60 mg total) by mouth 2 (two) times daily. 60 tablet 3  . lansoprazole (PREVACID) 15 MG capsule Take 1 capsule (15 mg total) by mouth daily at 12 noon. 90 capsule 3  . levothyroxine (SYNTHROID, LEVOTHROID) 25 MCG tablet Take 1 tablet (25 mcg total) by mouth every morning. 30 tablet 1  . metoprolol succinate (TOPROL-XL) 50 MG 24 hr tablet Take 1 tablet (50 mg total) by mouth daily. Take with or immediately following a meal. (Patient taking differently: Take 25 mg by mouth daily. Take with or immediately following a meal.) 30 tablet 1  . nitroGLYCERIN (NITROSTAT) 0.4 MG SL tablet Place 0.4 mg under the tongue every 5 (five) minutes as needed for chest pain.     Marland Kitchen ondansetron (ZOFRAN) 8 MG tablet Take 8 mg by mouth 2 (two) times daily. For nausea    . Oxygen Permeable Lens Products SOLN Inhale into the lungs continuous. 6 liters    . Oxymetazoline HCl (NASAL SPRAY) 0.05 % SOLN Place 1 spray into the nose as  needed (for congestion).    . potassium chloride SA (K-DUR,KLOR-CON) 10 MEQ tablet Take 4 tablets (40 mEq total) by mouth daily. 120 tablet 3  . Probiotic Product (FLORAJEN3 PO) Take 1 tablet by mouth daily.    . prochlorperazine (COMPAZINE) 5 MG tablet Take 1 tablet (5 mg total) by mouth every 6 (six) hours as needed for nausea or vomiting. 30 tablet 0  .  saccharomyces boulardii (FLORASTOR) 250 MG capsule Take 1 capsule (250 mg total) by mouth 2 (two) times daily. 60 capsule 0  . [DISCONTINUED] cetirizine (ZYRTEC) 10 MG tablet Take 10 mg by mouth daily.      No current facility-administered medications for this visit.    Allergies  Allergen Reactions  . Aspirin     REACTION: Intolerance  . Codeine Other (See Comments)    Messes with digestive system  . Niacin Other (See Comments)    Pins sticking into skin  . Nsaids Other (See Comments)    REACTION: Aggravates Digestive System   . Phenergan Fortis [Promethazine] Other (See Comments)    Family and pt states becomes very confused and wild.   . Sulfonamide Derivatives Other (See Comments)    Messes with digestive system    History   Social History  . Marital Status: Married    Spouse Name: N/A  . Number of Children: N/A  . Years of Education: N/A   Occupational History  . Not on file.   Social History Main Topics  . Smoking status: Never Smoker   . Smokeless tobacco: Never Used  . Alcohol Use: No  . Drug Use: No  . Sexual Activity: Not Currently   Other Topics Concern  . Not on file   Social History Narrative     Review of Systems: General: negative for chills, fever, night sweats or weight changes.  Cardiovascular: negative for chest pain, dyspnea on exertion, edema, orthopnea, palpitations, paroxysmal nocturnal dyspnea or shortness of breath Dermatological: negative for rash Respiratory: negative for cough or wheezing Urologic: negative for hematuria Abdominal: negative for nausea, vomiting, diarrhea, bright red blood per rectum, melena, or hematemesis Neurologic: negative for visual changes, syncope, or dizziness All other systems reviewed and are otherwise negative except as noted above.    Blood pressure 120/56, pulse 80, height 5' (1.524 m), weight 142  lb (64.411 kg).  General appearance: alert and no distress Neck: no adenopathy, no JVD, supple, symmetrical, trachea midline, thyroid not enlarged, symmetric, no tenderness/mass/nodules and soft right carotid bruit Lungs: clear to auscultation bilaterally Heart: regular rate and rhythm, S1, S2 normal, no murmur, click, rub or gallop Extremities: extremities normal, atraumatic, no cyanosis or edema  EKG not performed today  ASSESSMENT AND PLAN:   Bilateral carotid artery disease I am seeing this balding at the request of Dr. Haroldine Laws for evaluation of high-grade bilateral carotid artery stenosis.she has a history of coronary artery disease status post remote coronary artery bypass grafting and stenting in the past. She also has severe pulmonary hypertension on pulmonary vasodilators. She had carotid Dopplers recently performed on 12/03/14 revealing an occluded left internal carotid artery and high-grade right internal carotid artery stenosis. She is neurologically asymptomatic. She is on antiplatelet therapy. I believe she is high risk for endarterectomy given her pulmonary status as well as being on dual antiplatelet therapy, her carotid disease would be best treated by stenting.  She underwent cardiac cath by Dr. Haroldine Laws revealing unchanged anatomy, Nl LV fxn with elevated PAP. Her serum K is 4.5 this  AM and her WBC is 17 K but it has been 15 K 2-3 weeks ago and there is no change in her condition to suggest infxn.       Lorretta Harp MD FACP,FACC,FAHA, FSCAI   Lorretta Harp, M.D., [place], [facility], FAHA, [facility] [address] [place], [place]  [address]  [phone] 01/25/2015 10:28 AM

## 2015-01-25 NOTE — Discharge Instructions (Signed)

## 2015-01-25 NOTE — Interval H&P Note (Signed)
History and Physical Interval Note:  01/25/2015 12:23 PM  Meagan [name]  has presented today for surgery, with the diagnosis of carotid stenosis  The various methods of treatment have been discussed with the patient and family. After consideration of risks, benefits and other options for treatment, the patient has consented to  Procedure(s): CAROTID STENT INSERTION (N/A) as a surgical intervention .  The patient's history has been reviewed, patient examined, no change in status, stable for surgery.  I have reviewed the patient's chart and labs.  Questions were answered to the patient's satisfaction.     Lorretta Harp

## 2015-03-01 ENCOUNTER — Other Ambulatory Visit (HOSPITAL_COMMUNITY): Payer: Self-pay | Admitting: Internal Medicine

## 2015-03-02 ENCOUNTER — Encounter (HOSPITAL_COMMUNITY): Payer: Self-pay

## 2015-03-02 ENCOUNTER — Ambulatory Visit (HOSPITAL_COMMUNITY)
Admission: RE | Admit: 2015-03-02 | Discharge: 2015-03-02 | Disposition: A | Discharge status: Home / Self Care | Payer: Medicare Other | Source: Ambulatory Visit | Attending: Internal Medicine | Admitting: Internal Medicine

## 2015-03-02 DIAGNOSIS — D649 Anemia, unspecified: Secondary | ICD-10-CM | POA: Insufficient documentation

## 2015-03-02 LAB — PREPARE RBC (CROSSMATCH)

## 2015-03-02 LAB — HEMOGLOBIN AND HEMATOCRIT, BLOOD
HCT: 26 % — ABNORMAL LOW (ref 36.0–46.0)
HEMOGLOBIN: 7.7 g/dL — AB (ref 12.0–15.0)

## 2015-03-02 NOTE — Discharge Instructions (Signed)

## 2015-03-02 NOTE — Progress Notes (Signed)
Results for WILLELLA, HARDING (MRN [id number]) as of 03/02/2015 14:44 For H&H, type and cross for 2 units of PC.today Patient to arrive in the am to receive 2 units of  Blood. Consent and attestation  Forms received and reviewed.   Ref. Range 03/02/2015 12:50  Hemoglobin Latest Ref Range: 12.0-15.0 g/dL 7.7 (L)  HCT Latest Ref Range: 36.0-46.0 % 26.0 (L)

## 2015-03-03 ENCOUNTER — Encounter (HOSPITAL_COMMUNITY)
Admission: RE | Admit: 2015-03-03 | Discharge: 2015-03-03 | Disposition: A | Discharge status: Home / Self Care | Payer: Medicare Other | Source: Ambulatory Visit | Attending: Internal Medicine | Admitting: Internal Medicine

## 2015-03-03 DIAGNOSIS — D649 Anemia, unspecified: Secondary | ICD-10-CM | POA: Insufficient documentation

## 2015-03-03 MED ORDER — SODIUM CHLORIDE 0.9 % IV SOLN
Freq: Once | INTRAVENOUS | Status: AC
  Administered 2015-03-03: 09:00:00 via INTRAVENOUS

## 2015-03-03 NOTE — Progress Notes (Signed)
[place] to inquire about getting a dose of Lasix between units of PRBC. Pt didn't take her prescribed 80mg  dose @ home. Nurse to ask Dr. Gerarda Fraction and call me back.

## 2015-03-03 NOTE — Discharge Instructions (Signed)

## 2015-03-04 LAB — TYPE AND SCREEN
ABO/RH(D): O POS
ANTIBODY SCREEN: NEGATIVE
UNIT DIVISION: 0
Unit division: 0

## 2015-03-09 ENCOUNTER — Emergency Department (HOSPITAL_COMMUNITY): Payer: Medicare Other

## 2015-03-09 ENCOUNTER — Encounter (HOSPITAL_COMMUNITY): Payer: Self-pay | Admitting: *Deleted

## 2015-03-09 ENCOUNTER — Inpatient Hospital Stay (HOSPITAL_COMMUNITY)
Admission: EM | Admit: 2015-03-09 | Discharge: 2015-03-23 | DRG: 870 | Disposition: E | Payer: Medicare Other | Attending: Family Medicine | Admitting: Family Medicine

## 2015-03-09 DIAGNOSIS — Z452 Encounter for adjustment and management of vascular access device: Secondary | ICD-10-CM

## 2015-03-09 DIAGNOSIS — I5033 Acute on chronic diastolic (congestive) heart failure: Secondary | ICD-10-CM | POA: Diagnosis present

## 2015-03-09 DIAGNOSIS — Z66 Do not resuscitate: Secondary | ICD-10-CM | POA: Diagnosis present

## 2015-03-09 DIAGNOSIS — I129 Hypertensive chronic kidney disease with stage 1 through stage 4 chronic kidney disease, or unspecified chronic kidney disease: Secondary | ICD-10-CM | POA: Diagnosis present

## 2015-03-09 DIAGNOSIS — R6521 Severe sepsis with septic shock: Secondary | ICD-10-CM | POA: Diagnosis present

## 2015-03-09 DIAGNOSIS — Z7902 Long term (current) use of antithrombotics/antiplatelets: Secondary | ICD-10-CM

## 2015-03-09 DIAGNOSIS — J189 Pneumonia, unspecified organism: Secondary | ICD-10-CM | POA: Diagnosis present

## 2015-03-09 DIAGNOSIS — L270 Generalized skin eruption due to drugs and medicaments taken internally: Secondary | ICD-10-CM | POA: Diagnosis not present

## 2015-03-09 DIAGNOSIS — I248 Other forms of acute ischemic heart disease: Secondary | ICD-10-CM | POA: Diagnosis present

## 2015-03-09 DIAGNOSIS — J810 Acute pulmonary edema: Secondary | ICD-10-CM | POA: Insufficient documentation

## 2015-03-09 DIAGNOSIS — J9622 Acute and chronic respiratory failure with hypercapnia: Secondary | ICD-10-CM | POA: Diagnosis present

## 2015-03-09 DIAGNOSIS — Z951 Presence of aortocoronary bypass graft: Secondary | ICD-10-CM

## 2015-03-09 DIAGNOSIS — F419 Anxiety disorder, unspecified: Secondary | ICD-10-CM | POA: Diagnosis present

## 2015-03-09 DIAGNOSIS — J9600 Acute respiratory failure, unspecified whether with hypoxia or hypercapnia: Secondary | ICD-10-CM

## 2015-03-09 DIAGNOSIS — Z794 Long term (current) use of insulin: Secondary | ICD-10-CM

## 2015-03-09 DIAGNOSIS — Z978 Presence of other specified devices: Secondary | ICD-10-CM | POA: Insufficient documentation

## 2015-03-09 DIAGNOSIS — J9621 Acute and chronic respiratory failure with hypoxia: Secondary | ICD-10-CM | POA: Diagnosis present

## 2015-03-09 DIAGNOSIS — G9340 Encephalopathy, unspecified: Secondary | ICD-10-CM | POA: Diagnosis present

## 2015-03-09 DIAGNOSIS — Z955 Presence of coronary angioplasty implant and graft: Secondary | ICD-10-CM

## 2015-03-09 DIAGNOSIS — R6520 Severe sepsis without septic shock: Secondary | ICD-10-CM

## 2015-03-09 DIAGNOSIS — I2781 Cor pulmonale (chronic): Secondary | ICD-10-CM | POA: Diagnosis present

## 2015-03-09 DIAGNOSIS — J984 Other disorders of lung: Secondary | ICD-10-CM | POA: Diagnosis present

## 2015-03-09 DIAGNOSIS — E1122 Type 2 diabetes mellitus with diabetic chronic kidney disease: Secondary | ICD-10-CM | POA: Diagnosis present

## 2015-03-09 DIAGNOSIS — A419 Sepsis, unspecified organism: Principal | ICD-10-CM | POA: Diagnosis present

## 2015-03-09 DIAGNOSIS — N189 Chronic kidney disease, unspecified: Secondary | ICD-10-CM | POA: Diagnosis present

## 2015-03-09 DIAGNOSIS — Z9981 Dependence on supplemental oxygen: Secondary | ICD-10-CM

## 2015-03-09 DIAGNOSIS — Y95 Nosocomial condition: Secondary | ICD-10-CM | POA: Diagnosis present

## 2015-03-09 DIAGNOSIS — J9602 Acute respiratory failure with hypercapnia: Secondary | ICD-10-CM

## 2015-03-09 DIAGNOSIS — L8990 Pressure ulcer of unspecified site, unspecified stage: Secondary | ICD-10-CM | POA: Insufficient documentation

## 2015-03-09 DIAGNOSIS — D638 Anemia in other chronic diseases classified elsewhere: Secondary | ICD-10-CM | POA: Diagnosis present

## 2015-03-09 DIAGNOSIS — K219 Gastro-esophageal reflux disease without esophagitis: Secondary | ICD-10-CM | POA: Diagnosis present

## 2015-03-09 DIAGNOSIS — E039 Hypothyroidism, unspecified: Secondary | ICD-10-CM | POA: Diagnosis present

## 2015-03-09 DIAGNOSIS — I272 Other secondary pulmonary hypertension: Secondary | ICD-10-CM | POA: Diagnosis present

## 2015-03-09 DIAGNOSIS — G8929 Other chronic pain: Secondary | ICD-10-CM | POA: Diagnosis present

## 2015-03-09 DIAGNOSIS — R0602 Shortness of breath: Secondary | ICD-10-CM | POA: Diagnosis not present

## 2015-03-09 DIAGNOSIS — J811 Chronic pulmonary edema: Secondary | ICD-10-CM

## 2015-03-09 DIAGNOSIS — J9601 Acute respiratory failure with hypoxia: Secondary | ICD-10-CM

## 2015-03-09 DIAGNOSIS — E876 Hypokalemia: Secondary | ICD-10-CM | POA: Diagnosis present

## 2015-03-09 DIAGNOSIS — M797 Fibromyalgia: Secondary | ICD-10-CM | POA: Diagnosis present

## 2015-03-09 DIAGNOSIS — Z515 Encounter for palliative care: Secondary | ICD-10-CM

## 2015-03-09 DIAGNOSIS — K921 Melena: Secondary | ICD-10-CM | POA: Diagnosis not present

## 2015-03-09 DIAGNOSIS — I2510 Atherosclerotic heart disease of native coronary artery without angina pectoris: Secondary | ICD-10-CM | POA: Diagnosis present

## 2015-03-09 DIAGNOSIS — J9611 Chronic respiratory failure with hypoxia: Secondary | ICD-10-CM | POA: Insufficient documentation

## 2015-03-09 DIAGNOSIS — T360X5A Adverse effect of penicillins, initial encounter: Secondary | ICD-10-CM | POA: Diagnosis not present

## 2015-03-09 LAB — BLOOD GAS, ARTERIAL
Acid-Base Excess: 12.3 mmol/L — ABNORMAL HIGH (ref 0.0–2.0)
Bicarbonate: 38.4 mEq/L — ABNORMAL HIGH (ref 20.0–24.0)
DRAWN BY: 382351
FIO2: 1 %
O2 Saturation: 89.3 %
PH ART: 7.345 — AB (ref 7.350–7.450)
Patient temperature: 37
TCO2: 35 mmol/L (ref 0–100)
pCO2 arterial: 72.2 mmHg (ref 35.0–45.0)
pO2, Arterial: 62.5 mmHg — ABNORMAL LOW (ref 80.0–100.0)

## 2015-03-09 LAB — I-STAT TROPONIN, ED: Troponin i, poc: 0.06 ng/mL (ref 0.00–0.08)

## 2015-03-09 NOTE — ED Notes (Addendum)
Pt. W/COPD, PH, CHF has had 3 days of increasing SOB.  On continuous Flolan infusion.  Home O2 at 6L continuous.

## 2015-03-09 NOTE — ED Provider Notes (Signed)
CSN: [id number]     Arrival date & time 02/23/2015  2320 History  This chart was scribed for Merryl Hacker, MD by Chester Holstein, ED Scribe. This patient was seen in room APA02/APA02 and the patient's care was started at 11:43 PM.    Chief Complaint  Patient presents with  . Shortness of Breath   LEVEL 5 CAVEAT- RESPIRATORY DISTRESS  The history is provided by the patient and the spouse. The history is limited by the condition of the patient. No language interpreter was used.   HPI Comments: Meagan [name] is a 69 y.o. female who presents to the Emergency Department complaining of SOB with onset 3 days ago, worsening this evening. Pt with h/o CHF, COPD, and pulmonary hypertension. Per husband pt began complaining of worsening SOB today. He states he was unable to get her O2 saturation stats into baseline levels. He notes associated delirium. He reports that she has had progressive shortness of breath over the last several days and in general has had decline since the beginning of May. Pt took 8 mg Dilaudid and 0.5 mg Xanax at 8 PM which is her normal. Per nursing note pt is on 6L O2 at home.   Pt is awake but unable to answer any questions. Pt was seen at [facility] on 02/22/15 for 4 month f/u. [name] reports her hemoglobin was low and pt was given 2 units of blood transfusion last week. ROS limited due to condition of patient. Husband denies fever and cough. Pt is followed by Dr. Gilles Chiquito at [facility].   Past Medical History  Diagnosis Date  . Fibromyalgia   . Hypercholesterolemia   . Hypertension     pulmonary  pap 110/31 (mean 62)by [facility] 9/10 negative PE by chest CT 2010  . Pulmonary edema     Failed Revatio   . ARF (acute renal failure)     poor candidate for ACE -I or ARB  . Restrictive lung disease     obesity hypoventilation syndrome  . Renal insufficiency   . CAD (coronary artery disease)     multivessel DES x 2 RCA 2007  . Chronic diastolic heart failure   . MRSA (methicillin  resistant staphylococcus aureus) pneumonia   . Pulmonary hypertension   . Chronic anemia     anemia of chronic disease based on anemia panel 2011  . Pancreatic mass 08/01/2012  . PONV (postoperative nausea and vomiting)   . Heart murmur     "when I was small"  . CHF (congestive heart failure)   . Anginal pain   . Pneumonia X 3  . Type II diabetes mellitus   . Hypothyroidism   . H/O hiatal hernia   . GERD (gastroesophageal reflux disease)   . Anxiety   . On home oxygen therapy     "6L all the time" (02/24/2014  . Carotid artery disease    Past Surgical History  Procedure Laterality Date  . Coronary artery bypass graft  1997    LIMA to LAD,SVG to OM  . Esophagogastroduodenoscopy  06/28/10    schatzki ring otherwise normal;small hiatal hernia  . Coronary angioplasty  1993  . Coronary angioplasty with stent placement  ~ 1996; 2008    "think I have 3 stents"  . Abdominal hysterectomy  ~ 1986  . Dilation and curettage of uterus  ~ 1984  . Tubal ligation    . Esophagogastroduodenoscopy N/A 06/06/2014    Procedure: ESOPHAGOGASTRODUODENOSCOPY (EGD);  Surgeon: Lafayette Dragon, MD;  Location: [facility] ENDOSCOPY;  Service: Endoscopy;  Laterality: N/A;  . Hip arthroplasty Right 07/29/2014    Procedure: ARTHROPLASTY BIPOLAR HIP;  Surgeon: Mcarthur Rossetti, MD;  Location: [address];  Service: Orthopedics;  Laterality: Right;  . Left and right heart catheterization with coronary/graft angiogram N/A 01/21/2013    Procedure: LEFT AND RIGHT HEART CATHETERIZATION WITH Beatrix Fetters;  Surgeon: Jolaine Artist, MD;  Location: [facility] CATH LAB;  Service: Cardiovascular;  Laterality: N/A;  . Left and right heart catheterization with coronary/graft angiogram N/A 02/24/2014    Procedure: LEFT AND RIGHT HEART CATHETERIZATION WITH Beatrix Fetters;  Surgeon: Jolaine Artist, MD;  Location: [facility] CATH LAB;  Service: Cardiovascular;  Laterality: N/A;  . Left heart catheterization with coronary angiogram  N/A 01/03/2015    Procedure: LEFT HEART CATHETERIZATION WITH CORONARY ANGIOGRAM;  Surgeon: Jolaine Artist, MD;  Location: [facility] CATH LAB;  Service: Cardiovascular;  Laterality: N/A;  . Arch aortogram  01/25/2015    Procedure: ARCH AORTOGRAM;  Surgeon: Lorretta Harp, MD;  Location: [facility] CATH LAB;  Service: Cardiovascular;;   Family History  Problem Relation Age of Onset  . Cancer Mother     oral  . Heart attack Father   . COPD Brother   . Heart disease Brother    History  Substance Use Topics  . Smoking status: Never Smoker   . Smokeless tobacco: Never Used  . Alcohol Use: No   OB History    No data available     Review of Systems  Unable to perform ROS: Acuity of condition  Respiratory: Positive for shortness of breath.       Allergies  Aspirin; Codeine; Niacin; Nsaids; Phenergan fortis; and Sulfonamide derivatives  Home Medications   Prior to Admission medications   Medication Sig Start Date End Date Taking? Authorizing Provider  ALPRAZolam Duanne Moron) 0.5 MG tablet Take 2 tablets (1 mg total) by mouth at bedtime. For sleep. *Prescribed one tablet three times daily as needed* 08/17/14   Lavon Paganini Angiulli, PA-C  clopidogrel (PLAVIX) 75 MG tablet Take 1 tablet (75 mg total) by mouth daily. 10/20/14   Larey Dresser, MD  diclofenac sodium (VOLTAREN) 1 % GEL Apply 2 g topically 4 (four) times daily. 08/17/14   Lavon Paganini Angiulli, PA-C  DULoxetine (CYMBALTA) 30 MG capsule Take 1 capsule (30 mg total) by mouth at bedtime. 08/17/14   Lavon Paganini Angiulli, PA-C  DULoxetine (CYMBALTA) 60 MG capsule Take 1 capsule (60 mg total) by mouth every morning. 08/17/14   Lavon Paganini Angiulli, PA-C  Epoprostenol Sodium (FLOLAN IV) Inject into the vein. 1.5 mg. Inject into the vein continuously. Infuse mg/kg/min via cont IV pump.    Historical Provider, MD  ezetimibe-simvastatin (VYTORIN) 10-40 MG per tablet Take 1 tablet by mouth every evening. 08/17/14   Lavon Paganini Angiulli, PA-C  furosemide (LASIX) 40  MG tablet Take 2 tablets (80 mg total) by mouth daily. 08/17/14   Lavon Paganini Angiulli, PA-C  HYDROmorphone (DILAUDID) 2 MG tablet Take 2 tablets (4 mg total) by mouth every 6 (six) hours as needed for severe pain. Patient not taking: Reported on 01/24/2015 08/03/14   Domenic Polite, MD  HYDROmorphone (DILAUDID) 4 MG tablet Take 8 mg by mouth every 6 (six) hours as needed for moderate pain or severe pain.    Historical Provider, MD  insulin glargine (LANTUS) 100 UNIT/ML injection Inject 0.08 mLs (8 Units total) into the skin at bedtime. Patient taking differently: Inject 26 Units into the skin  daily.  08/17/14   Lavon Paganini Angiulli, PA-C  isosorbide mononitrate (IMDUR) 60 MG 24 hr tablet Take 1 tablet (60 mg total) by mouth 2 (two) times daily. 11/17/14   Jolaine Artist, MD  lansoprazole (PREVACID) 15 MG capsule Take 1 capsule (15 mg total) by mouth daily at 12 noon. 11/17/14   Amy D Clegg, NP  levothyroxine (SYNTHROID, LEVOTHROID) 25 MCG tablet Take 1 tablet (25 mcg total) by mouth every morning. 08/17/14   Lavon Paganini Angiulli, PA-C  metoprolol succinate (TOPROL-XL) 50 MG 24 hr tablet Take 1 tablet (50 mg total) by mouth daily. Take with or immediately following a meal. Patient taking differently: Take 25 mg by mouth daily. Take with or immediately following a meal. 08/17/14   Daniel J Angiulli, PA-C  NITROSTAT 0.4 MG SL tablet TAKE ONE TABLET UNDER THE TONGUE AS NEEDED FOR CHEST PAIN. MAY REPEAT IN 5 MINUTES IF NEEDED 03/01/15   Jolaine Artist, MD  Oxygen Permeable Lens Products SOLN Inhale into the lungs continuous. 6 liters 12/31/11   Jolaine Artist, MD  OXYGEN Inhale 6 L into the lungs continuous.    Historical Provider, MD  Oxymetazoline HCl (NASAL SPRAY) 0.05 % SOLN Place 1 spray into the nose as needed (for congestion).    Historical Provider, MD  potassium chloride SA (K-DUR,KLOR-CON) 10 MEQ tablet Take 4 tablets (40 mEq total) by mouth daily. 10/20/14   Larey Dresser, MD  Probiotic Product  (FLORAJEN3 PO) Take 1 tablet by mouth daily.    Historical Provider, MD  prochlorperazine (COMPAZINE) 5 MG tablet Take 1 tablet (5 mg total) by mouth every 6 (six) hours as needed for nausea or vomiting. 08/17/14   Lavon Paganini Angiulli, PA-C  saccharomyces boulardii (FLORASTOR) 250 MG capsule Take 1 capsule (250 mg total) by mouth 2 (two) times daily. 08/17/14   Lavon Paganini Angiulli, PA-C  traMADol (ULTRAM) 50 MG tablet Take 50 mg by mouth every 6 (six) hours as needed for moderate pain.    Historical Provider, MD   BP 109/53 mmHg  Pulse 114  Temp(Src) 100.7 F (38.2 C) (Rectal)  Resp 38  SpO2 99% Physical Exam  Constitutional:  Ill-appearing, tachypnea, somnolent but arousable  HENT:  Head: Normocephalic and atraumatic.  Eyes:  Pupils 3 mm reactive bilaterally  Neck: Neck supple.  Cardiovascular: Regular rhythm and normal heart sounds.   Tachycardia  Pulmonary/Chest: She is in respiratory distress. She has no wheezes.  Poor effort, shallow inspirations, tachypnea Indwelling catheter right upper chest  Abdominal: Soft. Bowel sounds are normal. There is no tenderness. There is no rebound.  Musculoskeletal: She exhibits no edema.  Neurological:  Somnolent but arousable  Skin: Skin is warm and dry.  Psychiatric: She has a normal mood and affect.  Nursing note and vitals reviewed.   ED Course  Procedures (including critical care time)  CRITICAL CARE Performed by: Merryl Hacker   Total critical care time: 45 min  Critical care time was exclusive of separately billable procedures and treating other patients.  Critical care was necessary to treat or prevent imminent or life-threatening deterioration.  Critical care was time spent personally by me on the following activities: development of treatment plan with patient and/or surrogate as well as nursing, discussions with consultants, evaluation of patient's response to treatment, examination of patient, obtaining history from  patient or surrogate, ordering and performing treatments and interventions, ordering and review of laboratory studies, ordering and review of radiographic studies, pulse oximetry and re-evaluation  of patient's condition.   DIAGNOSTIC STUDIES: Oxygen Saturation is 92% on 15L O2, normal by my interpretation.    COORDINATION OF CARE: 11:43 PM Discussed treatment plan with patient at beside, the patient agrees with the plan and has no further questions at this time.   Labs Review Labs Reviewed  BLOOD GAS, ARTERIAL - Abnormal; Notable for the following:    pH, Arterial 7.345 (*)    pCO2 arterial 72.2 (*)    pO2, Arterial 62.5 (*)    Bicarbonate 38.4 (*)    Acid-Base Excess 12.3 (*)    All other components within normal limits  CBC WITH DIFFERENTIAL/PLATELET - Abnormal; Notable for the following:    WBC 25.3 (*)    RDW 16.4 (*)    Neutrophils Relative % 86 (*)    Neutro Abs 22.0 (*)    Lymphocytes Relative 3 (*)    Lymphs Abs 0.6 (*)    Monocytes Absolute 2.4 (*)    All other components within normal limits  BASIC METABOLIC PANEL - Abnormal; Notable for the following:    Chloride 87 (*)    CO2 40 (*)    Glucose, Bld 154 (*)    BUN 27 (*)    Creatinine, Ser 1.18 (*)    Calcium 8.6 (*)    GFR calc non Af Amer 46 (*)    GFR calc Af Amer 53 (*)    All other components within normal limits  TROPONIN I - Abnormal; Notable for the following:    Troponin I 0.11 (*)    All other components within normal limits  BRAIN NATRIURETIC PEPTIDE - Abnormal; Notable for the following:    B Natriuretic Peptide 1442.0 (*)    All other components within normal limits  BLOOD GAS, ARTERIAL - Abnormal; Notable for the following:    pCO2 arterial 62.7 (*)    pO2, Arterial 60 (*)    Bicarbonate 35.9 (*)    All other components within normal limits  CULTURE, BLOOD (ROUTINE X 2)  CULTURE, BLOOD (ROUTINE X 2)  URINE CULTURE  LACTIC ACID, PLASMA  LACTIC ACID, PLASMA  URINALYSIS, ROUTINE W REFLEX  MICROSCOPIC  I-STAT CG4 LACTIC ACID, ED  I-STAT TROPOININ, ED  I-STAT CG4 LACTIC ACID, ED    Imaging Review Dg Chest Portable 1 View  03/10/2015   CLINICAL DATA:  Increasing shortness of breath. Decreased oxygen saturation. Symptoms for 3 days.  EXAM: PORTABLE CHEST - 1 VIEW  COMPARISON:  01/21/2015  FINDINGS: Right central line remains in place, tip in the SVC. Patient is post median sternotomy. Lower lung volumes from prior exam. Cardiomegaly is again seen. Development of diffuse pulmonary edema. Hazy opacity at the lung bases consistent pleural effusions. Bibasilar opacities, likely atelectasis.  IMPRESSION: Findings consistent with CHF exacerbation.   Electronically Signed   By: Jeb Levering M.D.   On: 03/10/2015 00:28     EKG Interpretation   Date/Time:  Wednesday Mar 09 2015 23:28:17 EDT Ventricular Rate:  114 PR Interval:  103 QRS Duration: 130 QT Interval:  369 QTC Calculation: 508 R Axis:   -111 Text Interpretation:  Sinus tachycardia RBBB and LAFB Nonspecific T  abnormalities, lateral leads, new now tachycardic when compared to prior  Confirmed by HORTON  MD, [place] ([id number]) on 03/02/2015 11:32:01 PM      MDM   Final diagnoses:  Acute respiratory failure with hypercapnia  Sepsis, due to unspecified organism   Patient presents with increasing shortness of breath. Ill-appearing and in acute  respiratory distress on initial evaluation. Patient placed on BiPAP. Complex respiratory history including pulmonary hypertension, CTD and CHF. Last echo with an EF of 65-70%.  Workup initiated and notable for white blood count of 25.3 with a left shift.  Temperature 100.7 Sepsis workup initiated. Initial ABG with pH of 7.345 and a PCO2 of 72.2.  Baseline around 50.  Chest x-ray shows findings consistent with CHF. However, given temperature and white count, suspect underlying pneumonia as well. Patient given broad-spectrum antibiotics of flank and Zosyn. Blood cultures pending. Given  normal EF, will volume resuscitate with 1 L normal saline and reassess.  Given patient's multiple medical problems and pulmonary status, discussed with critical care at [facility]. Repeat ABG pending.  Patient accepted by Dr. Kara Mead.  1:42 AM Repeat ABG improved. Lactate normal. We'll hold fluids at 1 L and place on maintenance IV fluids.  I personally performed the services described in this documentation, which was scribed in my presence. The recorded information has been reviewed and is accurate.     Merryl Hacker, MD 03/10/15 [phone]

## 2015-03-09 NOTE — ED Notes (Signed)
   03/20/2015 2336  Respiratory  Respiratory (WDL) X  Bilateral Breath Sounds Diminished  L Breath Sounds Diminished  R Breath Sounds Diminished  Respiratory Pattern Labored;Other (Comment) (grunting)  Chest Assessment Chest expansion symmetrical  O2 Device Non-rebreather Mask  O2 Flow Rate (L/min) 15 L/min  Pt will not answer any questions, but is awake at this time. Pt is breathing 32bmp and is being placed on bipap at this time.

## 2015-03-10 ENCOUNTER — Inpatient Hospital Stay (HOSPITAL_COMMUNITY): Payer: Medicare Other

## 2015-03-10 DIAGNOSIS — Z955 Presence of coronary angioplasty implant and graft: Secondary | ICD-10-CM | POA: Diagnosis not present

## 2015-03-10 DIAGNOSIS — K921 Melena: Secondary | ICD-10-CM | POA: Diagnosis not present

## 2015-03-10 DIAGNOSIS — I248 Other forms of acute ischemic heart disease: Secondary | ICD-10-CM | POA: Diagnosis present

## 2015-03-10 DIAGNOSIS — J810 Acute pulmonary edema: Secondary | ICD-10-CM | POA: Insufficient documentation

## 2015-03-10 DIAGNOSIS — Z794 Long term (current) use of insulin: Secondary | ICD-10-CM | POA: Diagnosis not present

## 2015-03-10 DIAGNOSIS — I5022 Chronic systolic (congestive) heart failure: Secondary | ICD-10-CM | POA: Diagnosis not present

## 2015-03-10 DIAGNOSIS — J9601 Acute respiratory failure with hypoxia: Secondary | ICD-10-CM

## 2015-03-10 DIAGNOSIS — I2510 Atherosclerotic heart disease of native coronary artery without angina pectoris: Secondary | ICD-10-CM | POA: Diagnosis present

## 2015-03-10 DIAGNOSIS — G8929 Other chronic pain: Secondary | ICD-10-CM | POA: Diagnosis present

## 2015-03-10 DIAGNOSIS — J9622 Acute and chronic respiratory failure with hypercapnia: Secondary | ICD-10-CM | POA: Diagnosis present

## 2015-03-10 DIAGNOSIS — Z7902 Long term (current) use of antithrombotics/antiplatelets: Secondary | ICD-10-CM | POA: Diagnosis not present

## 2015-03-10 DIAGNOSIS — Z789 Other specified health status: Secondary | ICD-10-CM | POA: Diagnosis not present

## 2015-03-10 DIAGNOSIS — A419 Sepsis, unspecified organism: Secondary | ICD-10-CM | POA: Diagnosis present

## 2015-03-10 DIAGNOSIS — Z515 Encounter for palliative care: Secondary | ICD-10-CM | POA: Diagnosis not present

## 2015-03-10 DIAGNOSIS — Z66 Do not resuscitate: Secondary | ICD-10-CM | POA: Diagnosis not present

## 2015-03-10 DIAGNOSIS — K219 Gastro-esophageal reflux disease without esophagitis: Secondary | ICD-10-CM | POA: Diagnosis present

## 2015-03-10 DIAGNOSIS — J984 Other disorders of lung: Secondary | ICD-10-CM | POA: Diagnosis present

## 2015-03-10 DIAGNOSIS — I5033 Acute on chronic diastolic (congestive) heart failure: Secondary | ICD-10-CM | POA: Diagnosis present

## 2015-03-10 DIAGNOSIS — L270 Generalized skin eruption due to drugs and medicaments taken internally: Secondary | ICD-10-CM | POA: Diagnosis not present

## 2015-03-10 DIAGNOSIS — R570 Cardiogenic shock: Secondary | ICD-10-CM | POA: Diagnosis not present

## 2015-03-10 DIAGNOSIS — J189 Pneumonia, unspecified organism: Secondary | ICD-10-CM | POA: Diagnosis present

## 2015-03-10 DIAGNOSIS — J9621 Acute and chronic respiratory failure with hypoxia: Secondary | ICD-10-CM | POA: Diagnosis present

## 2015-03-10 DIAGNOSIS — Z951 Presence of aortocoronary bypass graft: Secondary | ICD-10-CM | POA: Diagnosis not present

## 2015-03-10 DIAGNOSIS — F419 Anxiety disorder, unspecified: Secondary | ICD-10-CM | POA: Diagnosis present

## 2015-03-10 DIAGNOSIS — E876 Hypokalemia: Secondary | ICD-10-CM | POA: Diagnosis present

## 2015-03-10 DIAGNOSIS — Z9981 Dependence on supplemental oxygen: Secondary | ICD-10-CM | POA: Diagnosis not present

## 2015-03-10 DIAGNOSIS — R6521 Severe sepsis with septic shock: Secondary | ICD-10-CM | POA: Diagnosis present

## 2015-03-10 DIAGNOSIS — R0602 Shortness of breath: Secondary | ICD-10-CM | POA: Diagnosis present

## 2015-03-10 DIAGNOSIS — R6520 Severe sepsis without septic shock: Secondary | ICD-10-CM

## 2015-03-10 DIAGNOSIS — N189 Chronic kidney disease, unspecified: Secondary | ICD-10-CM | POA: Diagnosis present

## 2015-03-10 DIAGNOSIS — T360X5A Adverse effect of penicillins, initial encounter: Secondary | ICD-10-CM | POA: Diagnosis not present

## 2015-03-10 DIAGNOSIS — Y95 Nosocomial condition: Secondary | ICD-10-CM | POA: Diagnosis present

## 2015-03-10 DIAGNOSIS — I2781 Cor pulmonale (chronic): Secondary | ICD-10-CM | POA: Diagnosis present

## 2015-03-10 DIAGNOSIS — D638 Anemia in other chronic diseases classified elsewhere: Secondary | ICD-10-CM | POA: Diagnosis present

## 2015-03-10 DIAGNOSIS — I272 Other secondary pulmonary hypertension: Secondary | ICD-10-CM | POA: Diagnosis present

## 2015-03-10 DIAGNOSIS — E1122 Type 2 diabetes mellitus with diabetic chronic kidney disease: Secondary | ICD-10-CM | POA: Diagnosis present

## 2015-03-10 DIAGNOSIS — J9600 Acute respiratory failure, unspecified whether with hypoxia or hypercapnia: Secondary | ICD-10-CM | POA: Diagnosis not present

## 2015-03-10 DIAGNOSIS — E039 Hypothyroidism, unspecified: Secondary | ICD-10-CM | POA: Diagnosis present

## 2015-03-10 DIAGNOSIS — G9340 Encephalopathy, unspecified: Secondary | ICD-10-CM | POA: Diagnosis present

## 2015-03-10 DIAGNOSIS — J9611 Chronic respiratory failure with hypoxia: Secondary | ICD-10-CM | POA: Diagnosis not present

## 2015-03-10 DIAGNOSIS — J9602 Acute respiratory failure with hypercapnia: Secondary | ICD-10-CM | POA: Diagnosis not present

## 2015-03-10 DIAGNOSIS — M797 Fibromyalgia: Secondary | ICD-10-CM | POA: Diagnosis present

## 2015-03-10 DIAGNOSIS — I129 Hypertensive chronic kidney disease with stage 1 through stage 4 chronic kidney disease, or unspecified chronic kidney disease: Secondary | ICD-10-CM | POA: Diagnosis present

## 2015-03-10 LAB — BLOOD GAS, ARTERIAL
Bicarbonate: 35.9 mEq/L — ABNORMAL HIGH (ref 20.0–24.0)
DELIVERY SYSTEMS: POSITIVE
DRAWN BY: 317771
EXPIRATORY PAP: 6
FIO2: 1 %
Inspiratory PAP: 14
O2 Saturation: 89.2 %
PH ART: 7.376 (ref 7.350–7.450)
TCO2: 33.1 mmol/L (ref 0–100)
pCO2 arterial: 62.7 mmHg (ref 35.0–45.0)
pO2, Arterial: 60 mmHg — ABNORMAL LOW (ref 80.0–100.0)

## 2015-03-10 LAB — CBC WITH DIFFERENTIAL/PLATELET
BASOS ABS: 0 10*3/uL (ref 0.0–0.1)
Basophils Relative: 0 % (ref 0–1)
EOS ABS: 0.2 10*3/uL (ref 0.0–0.7)
EOS PCT: 1 % (ref 0–5)
HCT: 41.4 % (ref 36.0–46.0)
HEMOGLOBIN: 12.6 g/dL (ref 12.0–15.0)
LYMPHS ABS: 0.6 10*3/uL — AB (ref 0.7–4.0)
Lymphocytes Relative: 3 % — ABNORMAL LOW (ref 12–46)
MCH: 26 pg (ref 26.0–34.0)
MCHC: 30.4 g/dL (ref 30.0–36.0)
MCV: 85.5 fL (ref 78.0–100.0)
MONOS PCT: 10 % (ref 3–12)
Monocytes Absolute: 2.4 10*3/uL — ABNORMAL HIGH (ref 0.1–1.0)
NEUTROS ABS: 22 10*3/uL — AB (ref 1.7–7.7)
Neutrophils Relative %: 86 % — ABNORMAL HIGH (ref 43–77)
Platelets: 299 10*3/uL (ref 150–400)
RBC: 4.84 MIL/uL (ref 3.87–5.11)
RDW: 16.4 % — AB (ref 11.5–15.5)
WBC: 25.3 10*3/uL — AB (ref 4.0–10.5)

## 2015-03-10 LAB — PHOSPHORUS
PHOSPHORUS: 2.4 mg/dL — AB (ref 2.5–4.6)
Phosphorus: 3.2 mg/dL (ref 2.5–4.6)

## 2015-03-10 LAB — BASIC METABOLIC PANEL
ANION GAP: 13 (ref 5–15)
Anion gap: 8 (ref 5–15)
BUN: 22 mg/dL — ABNORMAL HIGH (ref 6–20)
BUN: 27 mg/dL — AB (ref 6–20)
CALCIUM: 8 mg/dL — AB (ref 8.9–10.3)
CO2: 32 mmol/L (ref 22–32)
CO2: 40 mmol/L — AB (ref 22–32)
Calcium: 8.6 mg/dL — ABNORMAL LOW (ref 8.9–10.3)
Chloride: 87 mmol/L — ABNORMAL LOW (ref 101–111)
Chloride: 90 mmol/L — ABNORMAL LOW (ref 101–111)
Creatinine, Ser: 1.01 mg/dL — ABNORMAL HIGH (ref 0.44–1.00)
Creatinine, Ser: 1.18 mg/dL — ABNORMAL HIGH (ref 0.44–1.00)
GFR calc Af Amer: 53 mL/min — ABNORMAL LOW (ref >60)
GFR, EST NON AFRICAN AMERICAN: 46 mL/min — AB (ref >60)
GFR, EST NON AFRICAN AMERICAN: 55 mL/min — AB (ref >60)
GLUCOSE: 154 mg/dL — AB (ref 65–99)
Glucose, Bld: 113 mg/dL — ABNORMAL HIGH (ref 65–99)
Potassium: 4.4 mmol/L (ref 3.5–5.1)
Potassium: 4.7 mmol/L (ref 3.5–5.1)
SODIUM: 135 mmol/L (ref 135–145)
SODIUM: 135 mmol/L (ref 135–145)

## 2015-03-10 LAB — POCT I-STAT 3, ART BLOOD GAS (G3+)
Acid-Base Excess: 9 mmol/L — ABNORMAL HIGH (ref 0.0–2.0)
BICARBONATE: 36.4 meq/L — AB (ref 20.0–24.0)
O2 SAT: 100 %
PO2 ART: 198 mmHg — AB (ref 80.0–100.0)
Patient temperature: 98.6
TCO2: 38 mmol/L (ref 0–100)
pCO2 arterial: 60.6 mmHg (ref 35.0–45.0)
pH, Arterial: 7.386 (ref 7.350–7.450)

## 2015-03-10 LAB — GLUCOSE, CAPILLARY
GLUCOSE-CAPILLARY: 142 mg/dL — AB (ref 65–99)
GLUCOSE-CAPILLARY: 117 mg/dL — AB (ref 65–99)
GLUCOSE-CAPILLARY: 83 mg/dL (ref 65–99)
GLUCOSE-CAPILLARY: 151 mg/dL — AB (ref 65–99)
Glucose-Capillary: 104 mg/dL — ABNORMAL HIGH (ref 65–99)
Glucose-Capillary: 110 mg/dL — ABNORMAL HIGH (ref 65–99)

## 2015-03-10 LAB — TROPONIN I
TROPONIN I: 0.1 ng/mL — AB (ref <0.031)
Troponin I: 0.08 ng/mL — ABNORMAL HIGH (ref <0.031)
Troponin I: 0.08 ng/mL — ABNORMAL HIGH (ref <0.031)
Troponin I: 0.11 ng/mL — ABNORMAL HIGH (ref <0.031)

## 2015-03-10 LAB — URINALYSIS, ROUTINE W REFLEX MICROSCOPIC
Bilirubin Urine: NEGATIVE
Glucose, UA: NEGATIVE mg/dL
Ketones, ur: NEGATIVE mg/dL
Leukocytes, UA: NEGATIVE
Nitrite: NEGATIVE
Protein, ur: NEGATIVE mg/dL
Specific Gravity, Urine: 1.015 (ref 1.005–1.030)
Urobilinogen, UA: 1 mg/dL (ref 0.0–1.0)
pH: 6 (ref 5.0–8.0)

## 2015-03-10 LAB — CARBOXYHEMOGLOBIN
CARBOXYHEMOGLOBIN: 1.3 % (ref 0.5–1.5)
Methemoglobin: 1 % (ref 0.0–1.5)
O2 Saturation: 77.7 %
Total hemoglobin: 11.6 g/dL — ABNORMAL LOW (ref 12.0–16.0)

## 2015-03-10 LAB — URINE MICROSCOPIC-ADD ON

## 2015-03-10 LAB — MRSA PCR SCREENING: MRSA by PCR: NEGATIVE

## 2015-03-10 LAB — CBC
HCT: 35.1 % — ABNORMAL LOW (ref 36.0–46.0)
Hemoglobin: 10.6 g/dL — ABNORMAL LOW (ref 12.0–15.0)
MCH: 25.2 pg — AB (ref 26.0–34.0)
MCHC: 30.2 g/dL (ref 30.0–36.0)
MCV: 83.4 fL (ref 78.0–100.0)
PLATELETS: 286 10*3/uL (ref 150–400)
RBC: 4.21 MIL/uL (ref 3.87–5.11)
RDW: 16.6 % — ABNORMAL HIGH (ref 11.5–15.5)
WBC: 25.7 10*3/uL — ABNORMAL HIGH (ref 4.0–10.5)

## 2015-03-10 LAB — PROCALCITONIN
Procalcitonin: 0.13 ng/mL
Procalcitonin: 0.21 ng/mL

## 2015-03-10 LAB — BRAIN NATRIURETIC PEPTIDE: B NATRIURETIC PEPTIDE 5: 1442 pg/mL — AB (ref 0.0–100.0)

## 2015-03-10 LAB — LACTIC ACID, PLASMA
LACTIC ACID, VENOUS: 0.5 mmol/L (ref 0.5–2.0)
LACTIC ACID, VENOUS: 1 mmol/L (ref 0.5–2.0)
Lactic Acid, Venous: 1.2 mmol/L (ref 0.5–2.0)

## 2015-03-10 LAB — I-STAT CG4 LACTIC ACID, ED: Lactic Acid, Venous: 0.87 mmol/L (ref 0.5–2.0)

## 2015-03-10 LAB — MAGNESIUM
MAGNESIUM: 1.4 mg/dL — AB (ref 1.7–2.4)
Magnesium: 1.5 mg/dL — ABNORMAL LOW (ref 1.7–2.4)

## 2015-03-10 MED ORDER — VITAL HIGH PROTEIN PO LIQD
1000.0000 mL | ORAL | Status: DC
  Administered 2015-03-10: 1000 mL
  Filled 2015-03-10 (×2): qty 1000

## 2015-03-10 MED ORDER — INSULIN ASPART 100 UNIT/ML ~~LOC~~ SOLN
0.0000 [IU] | SUBCUTANEOUS | Status: DC
  Administered 2015-03-10: 3 [IU] via SUBCUTANEOUS
  Administered 2015-03-10 – 2015-03-11 (×2): 2 [IU] via SUBCUTANEOUS
  Administered 2015-03-11 (×4): 3 [IU] via SUBCUTANEOUS
  Administered 2015-03-12: 5 [IU] via SUBCUTANEOUS
  Administered 2015-03-12 (×2): 3 [IU] via SUBCUTANEOUS
  Administered 2015-03-12 (×2): 5 [IU] via SUBCUTANEOUS
  Administered 2015-03-12 – 2015-03-13 (×2): 3 [IU] via SUBCUTANEOUS
  Administered 2015-03-13: 5 [IU] via SUBCUTANEOUS
  Administered 2015-03-13 – 2015-03-14 (×9): 3 [IU] via SUBCUTANEOUS
  Administered 2015-03-14: 8 [IU] via SUBCUTANEOUS
  Administered 2015-03-14: 5 [IU] via SUBCUTANEOUS
  Administered 2015-03-15 (×3): 8 [IU] via SUBCUTANEOUS
  Administered 2015-03-15: 5 [IU] via SUBCUTANEOUS
  Administered 2015-03-16 (×2): 3 [IU] via SUBCUTANEOUS
  Administered 2015-03-16 – 2015-03-17 (×4): 2 [IU] via SUBCUTANEOUS
  Administered 2015-03-17 (×2): 3 [IU] via SUBCUTANEOUS
  Administered 2015-03-18 (×3): 2 [IU] via SUBCUTANEOUS

## 2015-03-10 MED ORDER — FAMOTIDINE IN NACL 20-0.9 MG/50ML-% IV SOLN
20.0000 mg | INTRAVENOUS | Status: DC
  Administered 2015-03-10: 20 mg via INTRAVENOUS
  Filled 2015-03-10 (×2): qty 50

## 2015-03-10 MED ORDER — SODIUM CHLORIDE 0.9 % IV SOLN
250.0000 mL | INTRAVENOUS | Status: DC | PRN
  Administered 2015-03-10: 250 mL via INTRAVENOUS

## 2015-03-10 MED ORDER — VITAL AF 1.2 CAL PO LIQD
1000.0000 mL | ORAL | Status: DC
  Administered 2015-03-10: 2000 mL
  Administered 2015-03-12 – 2015-03-15 (×4): 1000 mL
  Filled 2015-03-10 (×8): qty 1000

## 2015-03-10 MED ORDER — FENTANYL CITRATE (PF) 100 MCG/2ML IJ SOLN
INTRAMUSCULAR | Status: AC
  Administered 2015-03-10: 50 ug
  Filled 2015-03-10: qty 4

## 2015-03-10 MED ORDER — PRO-STAT SUGAR FREE PO LIQD
30.0000 mL | Freq: Two times a day (BID) | ORAL | Status: AC
  Administered 2015-03-10 (×2): 30 mL
  Filled 2015-03-10 (×3): qty 30

## 2015-03-10 MED ORDER — FAMOTIDINE IN NACL 20-0.9 MG/50ML-% IV SOLN
20.0000 mg | Freq: Two times a day (BID) | INTRAVENOUS | Status: DC
  Filled 2015-03-10 (×2): qty 50

## 2015-03-10 MED ORDER — ENOXAPARIN SODIUM 40 MG/0.4ML ~~LOC~~ SOLN
40.0000 mg | Freq: Every day | SUBCUTANEOUS | Status: DC
  Filled 2015-03-10: qty 0.4

## 2015-03-10 MED ORDER — SODIUM CHLORIDE 0.9 % IV SOLN: Freq: Once | INTRAVENOUS | Status: DC

## 2015-03-10 MED ORDER — FUROSEMIDE 10 MG/ML IJ SOLN
40.0000 mg | Freq: Every day | INTRAMUSCULAR | Status: DC
  Administered 2015-03-11 – 2015-03-14 (×4): 40 mg via INTRAVENOUS
  Filled 2015-03-10 (×5): qty 4

## 2015-03-10 MED ORDER — PH 12 STERILE DILUENT
34.3300 ng/kg/min | INTRAVENOUS | Status: DC
  Administered 2015-03-10: 34.33 ng/kg/min via INTRAVENOUS
  Filled 2015-03-10: qty 15

## 2015-03-10 MED ORDER — CETYLPYRIDINIUM CHLORIDE 0.05 % MT LIQD
7.0000 mL | Freq: Two times a day (BID) | OROMUCOSAL | Status: DC
  Administered 2015-03-10 – 2015-03-12 (×6): 7 mL via OROMUCOSAL

## 2015-03-10 MED ORDER — FENTANYL CITRATE (PF) 100 MCG/2ML IJ SOLN
100.0000 ug | INTRAMUSCULAR | Status: DC | PRN
  Administered 2015-03-10: 100 ug via INTRAVENOUS
  Filled 2015-03-10 (×2): qty 2

## 2015-03-10 MED ORDER — CHLORHEXIDINE GLUCONATE 0.12 % MT SOLN
15.0000 mL | Freq: Two times a day (BID) | OROMUCOSAL | Status: DC
  Administered 2015-03-10 – 2015-03-15 (×11): 15 mL via OROMUCOSAL
  Filled 2015-03-10 (×11): qty 15

## 2015-03-10 MED ORDER — PIPERACILLIN-TAZOBACTAM 3.375 G IVPB 30 MIN
3.3750 g | Freq: Once | INTRAVENOUS | Status: AC
  Administered 2015-03-10: 3.375 g via INTRAVENOUS
  Filled 2015-03-10: qty 50

## 2015-03-10 MED ORDER — ICE PACK FOR EPOPROSTENOL (FLOLAN) INFUSION
1.0000 | Freq: Four times a day (QID) | Status: DC
  Administered 2015-03-10 – 2015-03-21 (×44): 1
  Filled 2015-03-10 (×49): qty 1

## 2015-03-10 MED ORDER — PH 12 STERILE DILUENT
32.5700 ng/kg/min | INTRAVENOUS | Status: DC
  Filled 2015-03-10: qty 15

## 2015-03-10 MED ORDER — ALBUTEROL SULFATE (2.5 MG/3ML) 0.083% IN NEBU: 2.5000 mg | INHALATION_SOLUTION | RESPIRATORY_TRACT | Status: DC | PRN

## 2015-03-10 MED ORDER — ROCURONIUM BROMIDE 50 MG/5ML IV SOLN
1.0000 mg/kg | Freq: Once | INTRAVENOUS | Status: AC
  Administered 2015-03-10: 10 mg via INTRAVENOUS

## 2015-03-10 MED ORDER — DOPAMINE-DEXTROSE 3.2-5 MG/ML-% IV SOLN
0.0000 ug/kg/min | INTRAVENOUS | Status: DC
  Administered 2015-03-10: 15 ug/kg/min via INTRAVENOUS
  Administered 2015-03-10: 5 ug/kg/min via INTRAVENOUS
  Filled 2015-03-10 (×2): qty 250

## 2015-03-10 MED ORDER — SODIUM CHLORIDE 0.9 % IV SOLN: 1000.0000 mL | Freq: Once | INTRAVENOUS | Status: DC

## 2015-03-10 MED ORDER — VANCOMYCIN HCL IN DEXTROSE 1-5 GM/200ML-% IV SOLN
1000.0000 mg | INTRAVENOUS | Status: DC
  Administered 2015-03-10: 1000 mg via INTRAVENOUS
  Filled 2015-03-10 (×2): qty 200

## 2015-03-10 MED ORDER — EPOPROSTENOL SODIUM 1.5 MG IV SOLR: 40.0000 ug | INTRAVENOUS | Status: DC

## 2015-03-10 MED ORDER — SODIUM CHLORIDE 0.9 % IV SOLN
1000.0000 mL | Freq: Once | INTRAVENOUS | Status: AC
  Administered 2015-03-10: 1000 mL via INTRAVENOUS

## 2015-03-10 MED ORDER — HEPARIN SODIUM (PORCINE) 5000 UNIT/ML IJ SOLN
5000.0000 [IU] | Freq: Three times a day (TID) | INTRAMUSCULAR | Status: DC
  Administered 2015-03-10 – 2015-03-16 (×19): 5000 [IU] via SUBCUTANEOUS
  Filled 2015-03-10 (×19): qty 1

## 2015-03-10 MED ORDER — VANCOMYCIN HCL IN DEXTROSE 1-5 GM/200ML-% IV SOLN
1000.0000 mg | Freq: Once | INTRAVENOUS | Status: AC
  Administered 2015-03-10: 1000 mg via INTRAVENOUS
  Filled 2015-03-10: qty 200

## 2015-03-10 MED ORDER — MIDAZOLAM HCL 2 MG/2ML IJ SOLN
INTRAMUSCULAR | Status: AC
  Administered 2015-03-10: 2 mg
  Filled 2015-03-10: qty 4

## 2015-03-10 MED ORDER — SODIUM CHLORIDE 0.9 % IV SOLN: 1000.0000 mL | INTRAVENOUS | Status: DC

## 2015-03-10 MED ORDER — LEVOTHYROXINE SODIUM 100 MCG IV SOLR
12.5000 ug | Freq: Every day | INTRAVENOUS | Status: DC
  Administered 2015-03-10 – 2015-03-12 (×3): 12.5 ug via INTRAVENOUS
  Filled 2015-03-10 (×3): qty 5

## 2015-03-10 MED ORDER — CLOPIDOGREL BISULFATE 75 MG PO TABS
75.0000 mg | ORAL_TABLET | Freq: Every day | ORAL | Status: DC
  Administered 2015-03-10 – 2015-03-19 (×9): 75 mg via ORAL
  Filled 2015-03-10 (×12): qty 1

## 2015-03-10 MED ORDER — PANTOPRAZOLE SODIUM 40 MG IV SOLR
40.0000 mg | Freq: Every day | INTRAVENOUS | Status: DC
  Administered 2015-03-10: 40 mg via INTRAVENOUS
  Filled 2015-03-10 (×2): qty 40

## 2015-03-10 MED ORDER — FENTANYL CITRATE (PF) 100 MCG/2ML IJ SOLN
100.0000 ug | INTRAMUSCULAR | Status: DC | PRN
  Administered 2015-03-10 – 2015-03-11 (×6): 100 ug via INTRAVENOUS
  Filled 2015-03-10 (×7): qty 2

## 2015-03-10 MED ORDER — FUROSEMIDE 10 MG/ML IJ SOLN
40.0000 mg | Freq: Once | INTRAMUSCULAR | Status: AC
  Administered 2015-03-10: 40 mg via INTRAVENOUS
  Filled 2015-03-10: qty 4

## 2015-03-10 MED ORDER — PH 12 STERILE DILUENT
32.5700 ng/kg/min | INTRAVENOUS | Status: AC
  Administered 2015-03-10: 32.57 ng/kg/min via INTRAVENOUS

## 2015-03-10 MED ORDER — PIPERACILLIN-TAZOBACTAM 3.375 G IVPB
3.3750 g | Freq: Three times a day (TID) | INTRAVENOUS | Status: DC
  Administered 2015-03-10 – 2015-03-11 (×5): 3.375 g via INTRAVENOUS
  Filled 2015-03-10 (×9): qty 50

## 2015-03-10 MED ORDER — ETOMIDATE 2 MG/ML IV SOLN
0.3000 mg/kg | Freq: Once | INTRAVENOUS | Status: DC
Start: 2015-03-10 — End: 2015-03-10

## 2015-03-10 MED ORDER — ETOMIDATE 2 MG/ML IV SOLN
20.0000 mg | Freq: Once | INTRAVENOUS | Status: AC
  Administered 2015-03-10: 20 mg via INTRAVENOUS

## 2015-03-10 NOTE — Progress Notes (Signed)
ABG results given to Dr Lake Bells,  No new vent orders received.

## 2015-03-10 NOTE — H&P (Signed)
Advanced Heart Failure Team Consult Note  Referring Physician: Mc[place] Primary Cardiologist:  [place]  Reason for Consultation:  Respiratory failure  HPI:    Meagan [name] is a 69 y/o woman with multiple medical problems including CAD s/p CABG and RCA stent, DM2, obesity, fibromyalgia, diastolic HF. She has severe PAH and is on Flolan followed by Dr. Gilles Chiquito at [facility].   Admitted in [facility] in October after a fall. Treated for R hip fracture and discharged to inpatient rehab. Rehab stay complicated by C diff. Completed 14 day course of vancomycin. Discharged to home 08/16/14.   Underwent R/L HC in 4/16 for lateral wall ischemia. Cath with stable CAD. Worsening PA pressures and cor pulmonale in setting of recent decrease in Flolan.  RA = 16 RV = 85/5/18 PA = 80/20 (45) PCW = 22 Fick cardiac output/index = 4.3/2.6 PVR = 5.6 WU Ao sat = 89% PA sat = 45%, 47%  Saw Dr. Gilles Chiquito in Clinic on 02/22/15 and Flolan increased gradually to 78 ml/24hrs. Also got 2 units of RBCs on 5/13.  She presented to [facility] yesterday with 3 days of worsening dyspnea. They were unable to get her O2 sats > 90%. She had associated delirium so he felt that it was best to take her to ED for further evaluation. Pt apparently took 8mg  Dilaudid and 0.5mg  Xanax at 8PM on night of presentation (these are usual doses for her). She is chronically on 6L O2.  While in [facility] ED, CXR was concerning for CHF but could not r/o PNA. WBC 25.6K. (she is not on steroids at home) ABG on NRB revealed AoC hypercarbic respiratory failure (7.34 / 72 / 62). She was placed on BiPAP, started on empiric abx, and transferred to [facility] for further evaluation and management. She was intubated on arrival to [facility]. Trop 0.08. BNP 1442 (224 in 12/15). + secretions in ETT. Now on dopamine 15.   Review of Systems: not available. Intubated and sedated   Home Medications Prior to Admission medications   Medication Sig Start Date End Date Taking? Authorizing Provider   ALPRAZolam Duanne Moron) 0.5 MG tablet Take 2 tablets (1 mg total) by mouth at bedtime. For sleep. *Prescribed one tablet three times daily as needed* 08/17/14   Lavon Paganini Angiulli, PA-C  B-D ULTRAFINE III SHORT PEN 31G X 8 MM MISC See admin instructions. 02/21/15   Historical Provider, MD  clopidogrel (PLAVIX) 75 MG tablet Take 1 tablet (75 mg total) by mouth daily. 10/20/14   Larey Dresser, MD  diclofenac sodium (VOLTAREN) 1 % GEL Apply 2 g topically 4 (four) times daily. 08/17/14   Lavon Paganini Angiulli, PA-C  DULoxetine (CYMBALTA) 30 MG capsule Take 1 capsule (30 mg total) by mouth at bedtime. 08/17/14   Lavon Paganini Angiulli, PA-C  DULoxetine (CYMBALTA) 60 MG capsule Take 1 capsule (60 mg total) by mouth every morning. 08/17/14   Lavon Paganini Angiulli, PA-C  Epoprostenol Sodium (FLOLAN IV) Inject into the vein. 1.5 mg. Inject into the vein continuously. Infuse mg/kg/min via cont IV pump.    Historical Provider, MD  ezetimibe-simvastatin (VYTORIN) 10-40 MG per tablet Take 1 tablet by mouth every evening. 08/17/14   Lavon Paganini Angiulli, PA-C  furosemide (LASIX) 40 MG tablet Take 2 tablets (80 mg total) by mouth daily. 08/17/14   Lavon Paganini Angiulli, PA-C  HYDROmorphone (DILAUDID) 2 MG tablet Take 2 tablets (4 mg total) by mouth every 6 (six) hours as needed for severe pain. Patient not taking: Reported on 01/24/2015 08/03/14  Domenic Polite, MD  HYDROmorphone (DILAUDID) 4 MG tablet Take 8 mg by mouth every 6 (six) hours as needed for moderate pain or severe pain.    Historical Provider, MD  insulin glargine (LANTUS) 100 UNIT/ML injection Inject 0.08 mLs (8 Units total) into the skin at bedtime. Patient taking differently: Inject 26 Units into the skin daily.  08/17/14   Lavon Paganini Angiulli, PA-C  isosorbide mononitrate (IMDUR) 60 MG 24 hr tablet Take 1 tablet (60 mg total) by mouth 2 (two) times daily. 11/17/14   Jolaine Artist, MD  lansoprazole (PREVACID) 15 MG capsule Take 1 capsule (15 mg total) by mouth daily at 12  noon. 11/17/14   Amy D Ninfa Meeker, NP  LANTUS SOLOSTAR 100 UNIT/ML Solostar Pen  02/18/15   Historical Provider, MD  levothyroxine (SYNTHROID, LEVOTHROID) 25 MCG tablet Take 1 tablet (25 mcg total) by mouth every morning. 08/17/14   Lavon Paganini Angiulli, PA-C  metoprolol succinate (TOPROL-XL) 100 MG 24 hr tablet Take 100 mg by mouth daily. 01/21/15   Historical Provider, MD  metoprolol succinate (TOPROL-XL) 50 MG 24 hr tablet Take 1 tablet (50 mg total) by mouth daily. Take with or immediately following a meal. Patient taking differently: Take 25 mg by mouth daily. Take with or immediately following a meal. 08/17/14   Daniel J Angiulli, PA-C  NITROSTAT 0.4 MG SL tablet TAKE ONE TABLET UNDER THE TONGUE AS NEEDED FOR CHEST PAIN. MAY REPEAT IN 5 MINUTES IF NEEDED 03/01/15   Jolaine Artist, MD  Oxygen Permeable Lens Products SOLN Inhale into the lungs continuous. 6 liters 12/31/11   Jolaine Artist, MD  OXYGEN Inhale 6 L into the lungs continuous.    Historical Provider, MD  OXYGEN Inhale 6 L into the lungs continuous.    Historical Provider, MD  Oxymetazoline HCl (NASAL SPRAY) 0.05 % SOLN Place 1 spray into the nose as needed (for congestion).    Historical Provider, MD  potassium chloride SA (K-DUR,KLOR-CON) 10 MEQ tablet Take 4 tablets (40 mEq total) by mouth daily. 10/20/14   Larey Dresser, MD  Probiotic Product (FLORAJEN3 PO) Take 1 tablet by mouth daily.    Historical Provider, MD  prochlorperazine (COMPAZINE) 5 MG tablet Take 1 tablet (5 mg total) by mouth every 6 (six) hours as needed for nausea or vomiting. 08/17/14   Lavon Paganini Angiulli, PA-C  saccharomyces boulardii (FLORASTOR) 250 MG capsule Take 1 capsule (250 mg total) by mouth 2 (two) times daily. 08/17/14   Daniel J Angiulli, PA-C  SSD 1 % cream Apply topically 4 (four) times daily. Apply to affected area for 10 days. 01/26/15   Historical Provider, MD  traMADol (ULTRAM) 50 MG tablet Take 50 mg by mouth every 6 (six) hours as needed for moderate  pain.    Historical Provider, MD    Past Medical History: Past Medical History  Diagnosis Date  . Fibromyalgia   . Hypercholesterolemia   . Hypertension     pulmonary  pap 110/31 (mean 62)by [facility] 9/10 negative PE by chest CT 2010  . Pulmonary edema     Failed Revatio   . ARF (acute renal failure)     poor candidate for ACE -I or ARB  . Restrictive lung disease     obesity hypoventilation syndrome  . Renal insufficiency   . CAD (coronary artery disease)     multivessel DES x 2 RCA 2007  . Chronic diastolic heart failure   . MRSA (methicillin resistant staphylococcus aureus) pneumonia   .  Pulmonary hypertension   . Chronic anemia     anemia of chronic disease based on anemia panel 2011  . Pancreatic mass 08/01/2012  . PONV (postoperative nausea and vomiting)   . Heart murmur     "when I was small"  . CHF (congestive heart failure)   . Anginal pain   . Pneumonia X 3  . Type II diabetes mellitus   . Hypothyroidism   . H/O hiatal hernia   . GERD (gastroesophageal reflux disease)   . Anxiety   . On home oxygen therapy     "6L all the time" (02/24/2014  . Carotid artery disease     Past Surgical History: Past Surgical History  Procedure Laterality Date  . Coronary artery bypass graft  1997    LIMA to LAD,SVG to OM  . Esophagogastroduodenoscopy  06/28/10    schatzki ring otherwise normal;small hiatal hernia  . Coronary angioplasty  1993  . Coronary angioplasty with stent placement  ~ 1996; 2008    "think I have 3 stents"  . Abdominal hysterectomy  ~ 1986  . Dilation and curettage of uterus  ~ 1984  . Tubal ligation    . Esophagogastroduodenoscopy N/A 06/06/2014    Procedure: ESOPHAGOGASTRODUODENOSCOPY (EGD);  Surgeon: Lafayette Dragon, MD;  Location: [facility] ENDOSCOPY;  Service: Endoscopy;  Laterality: N/A;  . Hip arthroplasty Right 07/29/2014    Procedure: ARTHROPLASTY BIPOLAR HIP;  Surgeon: Mcarthur Rossetti, MD;  Location: [facility];  Service: Orthopedics;  Laterality: Right;   . Left and right heart catheterization with coronary/graft angiogram N/A 01/21/2013    Procedure: LEFT AND RIGHT HEART CATHETERIZATION WITH Beatrix Fetters;  Surgeon: Jolaine Artist, MD;  Location: [facility] CATH LAB;  Service: Cardiovascular;  Laterality: N/A;  . Left and right heart catheterization with coronary/graft angiogram N/A 02/24/2014    Procedure: LEFT AND RIGHT HEART CATHETERIZATION WITH Beatrix Fetters;  Surgeon: Jolaine Artist, MD;  Location: [facility] CATH LAB;  Service: Cardiovascular;  Laterality: N/A;  . Left heart catheterization with coronary angiogram N/A 01/03/2015    Procedure: LEFT HEART CATHETERIZATION WITH CORONARY ANGIOGRAM;  Surgeon: Jolaine Artist, MD;  Location: [facility] CATH LAB;  Service: Cardiovascular;  Laterality: N/A;  . Arch aortogram  01/25/2015    Procedure: ARCH AORTOGRAM;  Surgeon: Lorretta Harp, MD;  Location: [facility] CATH LAB;  Service: Cardiovascular;;    Family History: Family History  Problem Relation Age of Onset  . Cancer Mother     oral  . Heart attack Father   . COPD Brother   . Heart disease Brother     Social History: History   Social History  . Marital Status: Married    Spouse Name: N/A  . Number of Children: N/A  . Years of Education: N/A   Social History Main Topics  . Smoking status: Never Smoker   . Smokeless tobacco: Never Used  . Alcohol Use: No  . Drug Use: No  . Sexual Activity: Not Currently   Other Topics Concern  . None   Social History Narrative    Allergies:  Allergies  Allergen Reactions  . Aspirin     REACTION: Intolerance  . Codeine Other (See Comments)    Messes with digestive system  . Niacin Other (See Comments)    Pins sticking into skin  . Nsaids Other (See Comments)    REACTION: Aggravates Digestive System   . Phenergan Fortis [Promethazine] Other (See Comments)    Family and pt states becomes very confused  and wild.   . Sulfonamide Derivatives Other (See Comments)    Messes with  digestive system    Objective:    Vital Signs:   Temp:  [89.5 F (31.9 C)-100.7 F (38.2 C)] 99.4 F (37.4 C) (05/19 1000) Pulse Rate:  [93-123] 106 (05/19 1130) Resp:  [11-42] 21 (05/19 1130) BP: (63-141)/(34-76) 101/38 mmHg (05/19 1130) SpO2:  [83 %-100 %] 99 % (05/19 1130) Arterial Line BP: (122-131)/(47-49) 122/49 mmHg (05/19 1030) FiO2 (%):  [60 %-100 %] 60 % (05/19 1130) Weight:  [61.236 kg (135 lb)] 61.236 kg (135 lb) (05/19 0313)    Weight change: Filed Weights   03/10/15 0313  Weight: 61.236 kg (135 lb)    Intake/Output:   Intake/Output Summary (Last 24 hours) at 03/10/15 1340 Last data filed at 03/10/15 1200  Gross per 24 hour  Intake  259.4 ml  Output    680 ml  Net -420.6 ml     Physical Exam: General: intubated sedated HEENT: +ETT Neck: thick . Carotids 2+ bilat; no bruits. No lymphadenopathy or thryomegaly appreciated.  Cor: PMI nonpalpable Regular rate & rhythm. 2/6 AS radiates to bilateral carotids.  Hickman catheter ok Lungs: mild rhonchi Abdomen: obese. soft, nontender, nondistended. No hepatosplenomegaly. No bruits or masses. Good bowel sounds.  Extremities: no cyanosis, + clubbing, macular-papular erythematous rash on LEs, trace edema  Neuro: sedated  Telemetry: Sinus tach   ECG: 114 RBBB/LAFB No ST-T wave abnormalities.    Labs: Basic Metabolic Panel:  Recent Labs Lab 03/19/2015 2330 03/10/15 0417 03/10/15 1150  NA 135 135  --   K 4.4 4.7  --   CL 87* 90*  --   CO2 40* 32  --   GLUCOSE 154* 113*  --   BUN 27* 22*  --   CREATININE 1.18* 1.01*  --   CALCIUM 8.6* 8.0*  --   MG  --  1.5* 1.4*  PHOS  --  3.2 2.4*    Liver Function Tests: No results for input(s): AST, ALT, ALKPHOS, BILITOT, PROT, ALBUMIN in the last 168 hours. No results for input(s): LIPASE, AMYLASE in the last 168 hours. No results for input(s): AMMONIA in the last 168 hours.  CBC:  Recent Labs Lab 02/20/2015 2330 03/10/15 0417  WBC 25.3* 25.7*   NEUTROABS 22.0*  --   HGB 12.6 10.6*  HCT 41.4 35.1*  MCV 85.5 83.4  PLT 299 286    Cardiac Enzymes:  Recent Labs Lab 03/16/2015 2330 03/10/15 0417 03/10/15 0929  TROPONINI 0.11* 0.10* 0.08*    BNP: BNP (last 3 results)  Recent Labs  10/20/14 1400 03/07/2015 2330  BNP 224.2* 1442.0*    ProBNP (last 3 results) No results for input(s): PROBNP in the last 8760 hours.   CBG:  Recent Labs Lab 03/10/15 0316 03/10/15 0421 03/10/15 0818 03/10/15 1136  GLUCAP 142* 117* 104* 110*    Coagulation Studies: No results for input(s): LABPROT, INR in the last 72 hours.  Other results:  Imaging: Dg Chest Port 1 View  03/10/2015   CLINICAL DATA:  69 year old female with a history of endotracheal tube placement.  EXAM: PORTABLE CHEST - 1 VIEW  COMPARISON:  03/10/2015  FINDINGS: Cardiomediastinal silhouette unchanged. Surgical changes of prior median sternotomy and CABG.  Persistent bilateral interstitial opacities, similar to comparison. No pneumothorax. Blunting of the bilateral costophrenic angle, likely pleural fluid.  Interval placement of endotracheal tube which terminates at the clavicular heads, measuring approximately 2 cm above the carina.  Interval placement of  left subclavian central venous catheter, which terminates in the region of the superior vena cava.  Interval placement of gastric tube, terminating out of the field of view.  Surgical shunt tubing on the right chest wall.  IMPRESSION: Persistent bilateral interstitial opacities, potentially edema, atypical infection, or other nonspecific inflammatory/ infectious pneumonitis.  Interval placement of the endotracheal tube, which terminates suitably above the carina at the level of the clavicular heads.  Interval placement of gastric tube and left subclavian central line as above.  Signed,  Dulcy Fanny. Earleen Newport, DO  [facility]  [facility]   Electronically Signed   By: Corrie Mckusick  D.O.   On: 03/10/2015 07:44   Dg Chest Port 1 View  03/10/2015   CLINICAL DATA:  Difficulty breathing  EXAM: PORTABLE CHEST - 1 VIEW  COMPARISON:  03/09/1959  FINDINGS: Cardiac shadow is at the upper limits of normal in size. Persistent right-sided Hickman catheter is noted. Postsurgical changes are again seen. Diffuse interstitial changes are noted throughout both lungs consistent with a degree of CHF but improved from the prior study. No acute bony abnormality is noted.  IMPRESSION: Improved pulmonary edema when compared with the prior exam. Persistent vascular congestion is noted.   Electronically Signed   By: Inez Catalina M.D.   On: 03/10/2015 07:27   Dg Chest Portable 1 View  03/10/2015   CLINICAL DATA:  Increasing shortness of breath. Decreased oxygen saturation. Symptoms for 3 days.  EXAM: PORTABLE CHEST - 1 VIEW  COMPARISON:  01/21/2015  FINDINGS: Right central line remains in place, tip in the SVC. Patient is post median sternotomy. Lower lung volumes from prior exam. Cardiomegaly is again seen. Development of diffuse pulmonary edema. Hazy opacity at the lung bases consistent pleural effusions. Bibasilar opacities, likely atelectasis.  IMPRESSION: Findings consistent with CHF exacerbation.   Electronically Signed   By: Jeb Levering M.D.   On: 03/10/2015 00:28      Medications:     Current Medications: . antiseptic oral rinse  7 mL Mouth Rinse q12n4p  . chlorhexidine  15 mL Mouth Rinse BID  . clopidogrel  75 mg Oral Daily  . famotidine (PEPCID) IV  20 mg Intravenous Q24H  . feeding supplement (PRO-STAT SUGAR FREE 64)  30 mL Per Tube BID  . feeding supplement (VITAL HIGH PROTEIN)  1,000 mL Per Tube Q24H  . heparin subcutaneous  5,000 Units Subcutaneous 3 times per day  . ice pack  1 each Other Q6H  . insulin aspart  0-15 Units Subcutaneous 6 times per day  . levothyroxine  12.5 mcg Intravenous Daily  . pantoprazole (PROTONIX) IV  40 mg Intravenous Daily  . piperacillin-tazobactam  (ZOSYN)  IV  3.375 g Intravenous 3 times per day  . [START ON 03/11/2015] vancomycin  1,000 mg Intravenous Q24H     Infusions: . DOPamine 15 mcg/kg/min (03/10/15 1122)  . epoprostenol 34.33 ng/kg/min (03/10/15 0515)      Assessment:   1. Acute on chronic respiratory failure - likely mixed picture 2. Acute on chronic diastolic HF 3. Pulmonary HTN on Flolan 4. CAD s/p CABG 5. HCAP 6. Hypomagensemia  Plan/Discussion:    With recent increase in Flolan and RBC administration she is certainly at risk for worsening HF. Initial CXR does look like CHF too however CVP only 10-11. WBC markedly elevated and has thick secretions in ETT.  Overall, I think main issue is severe HCAP with perhaps mild volume overload. Agree with broad spectrum abx.  Follow CVP and check co-ox. Will turn Flolan back down to 74. Daily lasix for now. Can adjust based on CVP.   D/w Dr. [place] Bells at bedside.   The patient is critically ill with multiple organ systems failure and requires high complexity decision making for assessment and support, frequent evaluation and titration of therapies, application of advanced monitoring technologies and extensive interpretation of multiple databases.   Critical Care Time devoted to patient care services described in this note is 35 Minutes.   Length of Stay: 0  Bensimhon, Daniel MD 03/10/2015, 1:40 PM  Advanced Heart Failure Team Pager [phone] (M-F; [place])  Please contact [facility] for night-coverage after hours (4p -7a ) and weekends on [email or web address]

## 2015-03-10 NOTE — Procedures (Signed)
Arterial Catheter Insertion Procedure Note Meagan [name] [id number] Dec 17, 1945  Procedure: Insertion of Arterial Catheter  Indications: Blood pressure monitoring and Frequent blood sampling  Procedure Details Consent: Unable to obtain consent because of patient on ventilator. Time Out: Verified patient identification, verified procedure, site/side was marked, verified correct patient position, special equipment/implants available, medications/allergies/relevent history reviewed, required imaging and test results available.  Performed  Maximum sterile technique was used including antiseptics, cap, gloves, gown, hand hygiene, mask and sheet. Skin prep: Chlorhexidine; local anesthetic administered 20 gauge catheter was inserted into left radial artery using the Seldinger technique.  Evaluation Blood flow good; BP tracing good. Complications: No apparent complications.   Philomena Doheny 03/10/2015

## 2015-03-10 NOTE — H&P (Signed)
PULMONARY / CRITICAL CARE MEDICINE   Name: Meagan [name] MRN: [id number] DOB: December 21, 1945    ADMISSION DATE:  03/18/2015 CONSULTATION DATE:  03/10/2015  REFERRING MD :  EDP at [place]:  SOB  INITIAL PRESENTATION:  69 y.o. F with severe PH (on Flolan) brought to [facility] ED 5/19 with SOB.  Found to have HCAP and AoC hypercarbic respiratory failure.  Started on BiPAP and transferred to [facility] for further evaluation and management.     STUDIES:  12/2014 RHC RA = 16 RV = 85/5/18 PA = 80/20 (45) PCW = 22 Fick cardiac output/index = 4.3/2.6 PVR = 5.6 WU Ao sat = 89% PA sat = 45%, 47%  CXR 5/19 >>> low lung volumes, cardiomegaly, pulmonary edema.  SIGNIFICANT EVENTS: 5/19 - admitted to [facility] with HCAP and AoC hypercarbic respiratory failure   HISTORY OF PRESENT ILLNESS:  Pt is encephalopathic; therefore, this HPI is obtained from chart review. Meagan [name] is a 69 y.o. F with multiple co-morbidities as outlined below including severe PH (on Flolan).  She presented to [facility] ED 5/18 with SOB x 3 days that worsened night of presentation.  Per husband, pt reported her SOB began to get worse earlier in the evening and he was unable to get her O2 sats > 90%.  She had associated delirium so he felt that it was best to take her to ED for further evaluation.  Pt apparently took 8mg  Dilaudid and 0.5mg  Xanax at 8PM on night of presentation (these are usual doses for her).  She is chronically on 6L O2 as an outpatient and she is followed by Dr. Gilles Chiquito at [facility].  While in [facility] ED, CXR was concerning for HCAP and ABG on NRB revealed AoC hypercarbic respiratory failure (7.34 / 72 / 62).  She was placed on BiPAP, started on empiric abx, and transferred to [facility] for further evaluation and management.  Her PFT's from 2010 showed FEV1 1.01 (56%) pre / 0.94 (52%) post.  Ratio 84 pre / 85 post.  TLC 51%.  PAST MEDICAL HISTORY :   has a past medical history of Fibromyalgia; Hypercholesterolemia; Hypertension; Pulmonary  edema; ARF (acute renal failure); Restrictive lung disease; Renal insufficiency; CAD (coronary artery disease); Chronic diastolic heart failure; MRSA (methicillin resistant staphylococcus aureus) pneumonia; Pulmonary hypertension; Chronic anemia; Pancreatic mass (08/01/2012); PONV (postoperative nausea and vomiting); Heart murmur; CHF (congestive heart failure); Anginal pain; Pneumonia (X 3); Type II diabetes mellitus; Hypothyroidism; H/O hiatal hernia; GERD (gastroesophageal reflux disease); Anxiety; On home oxygen therapy; and Carotid artery disease.  has past surgical history that includes Coronary artery bypass graft (1997); Esophagogastroduodenoscopy (06/28/10); Coronary angioplasty (1993); Coronary angioplasty with stent (~ 1996; 2008); Abdominal hysterectomy (~ 1986); Dilation and curettage of uterus (~ 1984); Tubal ligation; Esophagogastroduodenoscopy (N/A, 06/06/2014); Hip Arthroplasty (Right, 07/29/2014); left and right heart catheterization with coronary/graft angiogram (N/A, 01/21/2013); left and right heart catheterization with coronary/graft angiogram (N/A, 02/24/2014); left heart catheterization with coronary angiogram (N/A, 01/03/2015); and arch aortogram (01/25/2015). Prior to Admission medications   Medication Sig Start Date End Date Taking? Authorizing Provider  ALPRAZolam Duanne Moron) 0.5 MG tablet Take 2 tablets (1 mg total) by mouth at bedtime. For sleep. *Prescribed one tablet three times daily as needed* 08/17/14   Lavon Paganini Angiulli, PA-C  clopidogrel (PLAVIX) 75 MG tablet Take 1 tablet (75 mg total) by mouth daily. 10/20/14   Larey Dresser, MD  diclofenac sodium (VOLTAREN) 1 % GEL Apply 2 g topically 4 (four) times daily. 08/17/14  Lavon Paganini Angiulli, PA-C  DULoxetine (CYMBALTA) 30 MG capsule Take 1 capsule (30 mg total) by mouth at bedtime. 08/17/14   Lavon Paganini Angiulli, PA-C  DULoxetine (CYMBALTA) 60 MG capsule Take 1 capsule (60 mg total) by mouth every morning. 08/17/14   Lavon Paganini Angiulli,  PA-C  Epoprostenol Sodium (FLOLAN IV) Inject into the vein. 1.5 mg. Inject into the vein continuously. Infuse mg/kg/min via cont IV pump.    Historical Provider, MD  ezetimibe-simvastatin (VYTORIN) 10-40 MG per tablet Take 1 tablet by mouth every evening. 08/17/14   Lavon Paganini Angiulli, PA-C  furosemide (LASIX) 40 MG tablet Take 2 tablets (80 mg total) by mouth daily. 08/17/14   Lavon Paganini Angiulli, PA-C  HYDROmorphone (DILAUDID) 2 MG tablet Take 2 tablets (4 mg total) by mouth every 6 (six) hours as needed for severe pain. Patient not taking: Reported on 01/24/2015 08/03/14   Domenic Polite, MD  HYDROmorphone (DILAUDID) 4 MG tablet Take 8 mg by mouth every 6 (six) hours as needed for moderate pain or severe pain.    Historical Provider, MD  insulin glargine (LANTUS) 100 UNIT/ML injection Inject 0.08 mLs (8 Units total) into the skin at bedtime. Patient taking differently: Inject 26 Units into the skin daily.  08/17/14   Lavon Paganini Angiulli, PA-C  isosorbide mononitrate (IMDUR) 60 MG 24 hr tablet Take 1 tablet (60 mg total) by mouth 2 (two) times daily. 11/17/14   Jolaine Artist, MD  lansoprazole (PREVACID) 15 MG capsule Take 1 capsule (15 mg total) by mouth daily at 12 noon. 11/17/14   Amy D Clegg, NP  levothyroxine (SYNTHROID, LEVOTHROID) 25 MCG tablet Take 1 tablet (25 mcg total) by mouth every morning. 08/17/14   Lavon Paganini Angiulli, PA-C  metoprolol succinate (TOPROL-XL) 50 MG 24 hr tablet Take 1 tablet (50 mg total) by mouth daily. Take with or immediately following a meal. Patient taking differently: Take 25 mg by mouth daily. Take with or immediately following a meal. 08/17/14   Daniel J Angiulli, PA-C  NITROSTAT 0.4 MG SL tablet TAKE ONE TABLET UNDER THE TONGUE AS NEEDED FOR CHEST PAIN. MAY REPEAT IN 5 MINUTES IF NEEDED 03/01/15   Jolaine Artist, MD  Oxygen Permeable Lens Products SOLN Inhale into the lungs continuous. 6 liters 12/31/11   Jolaine Artist, MD  OXYGEN Inhale 6 L into the lungs  continuous.    Historical Provider, MD  Oxymetazoline HCl (NASAL SPRAY) 0.05 % SOLN Place 1 spray into the nose as needed (for congestion).    Historical Provider, MD  potassium chloride SA (K-DUR,KLOR-CON) 10 MEQ tablet Take 4 tablets (40 mEq total) by mouth daily. 10/20/14   Larey Dresser, MD  Probiotic Product (FLORAJEN3 PO) Take 1 tablet by mouth daily.    Historical Provider, MD  prochlorperazine (COMPAZINE) 5 MG tablet Take 1 tablet (5 mg total) by mouth every 6 (six) hours as needed for nausea or vomiting. 08/17/14   Lavon Paganini Angiulli, PA-C  saccharomyces boulardii (FLORASTOR) 250 MG capsule Take 1 capsule (250 mg total) by mouth 2 (two) times daily. 08/17/14   Lavon Paganini Angiulli, PA-C  traMADol (ULTRAM) 50 MG tablet Take 50 mg by mouth every 6 (six) hours as needed for moderate pain.    Historical Provider, MD   Allergies  Allergen Reactions  . Aspirin     REACTION: Intolerance  . Codeine Other (See Comments)    Messes with digestive system  . Niacin Other (See Comments)    Pins  sticking into skin  . Nsaids Other (See Comments)    REACTION: Aggravates Digestive System   . Phenergan Fortis [Promethazine] Other (See Comments)    Family and pt states becomes very confused and wild.   . Sulfonamide Derivatives Other (See Comments)    Messes with digestive system    FAMILY HISTORY:  Family History  Problem Relation Age of Onset  . Cancer Mother     oral  . Heart attack Father   . COPD Brother   . Heart disease Brother     SOCIAL HISTORY:  reports that she has never smoked. She has never used smokeless tobacco. She reports that she does not drink alcohol or use illicit drugs.  REVIEW OF SYSTEMS:  Unable to obtain as pt is encephalopathic.  SUBJECTIVE:   VITAL SIGNS: Temp:  [99.5 F (37.5 C)-100.7 F (38.2 C)] 99.5 F (37.5 C) (05/19 0400) Pulse Rate:  [93-123] 93 (05/19 0400) Resp:  [15-40] 27 (05/19 0400) BP: (96-141)/(43-76) 96/60 mmHg (05/19 0400) SpO2:  [83  %-100 %] 99 % (05/19 0400) FiO2 (%):  [100 %] 100 % (05/18 2347) Weight:  [61.236 kg (135 lb)] 61.236 kg (135 lb) (05/19 0313) HEMODYNAMICS:   VENTILATOR SETTINGS: Vent Mode:  [-]  FiO2 (%):  [100 %] 100 % INTAKE / OUTPUT: Intake/Output    None     PHYSICAL EXAMINATION: General: Chronically ill appearing female, in NAD. Neuro: Somnolent, awakens to voice and noxious stimuli.  Nods head in response to questions but does not answer verbally. HEENT: [place]/AT. PERRL, sclerae anicteric. Cardiovascular: RRR, no M/R/G.  Lungs: Respirations even and unlabored.  Faint bibasilar crackles. Abdomen: Obese, BS x 4, soft, NT/ND.  Musculoskeletal: No gross deformities, no edema.  Skin: Intact, warm, no rashes.  LABS:  CBC  Recent Labs Lab 03/02/2015 2330  WBC 25.3*  HGB 12.6  HCT 41.4  PLT 299   Coag's No results for input(s): APTT, INR in the last 168 hours. BMET  Recent Labs Lab 03/16/2015 2330  NA 135  K 4.4  CL 87*  CO2 40*  BUN 27*  CREATININE 1.18*  GLUCOSE 154*   Electrolytes  Recent Labs Lab 03/12/2015 2330  CALCIUM 8.6*   Sepsis Markers  Recent Labs Lab 03/10/15 0034 03/10/15 0044  LATICACIDVEN 1.0 0.87   ABG  Recent Labs Lab 02/21/2015 2333 03/10/15 0055  PHART 7.345* 7.376  PCO2ART 72.2* 62.7*  PO2ART 62.5* 60*   Liver Enzymes No results for input(s): AST, ALT, ALKPHOS, BILITOT, ALBUMIN in the last 168 hours. Cardiac Enzymes  Recent Labs Lab 03/08/2015 2330  TROPONINI 0.11*   Glucose  Recent Labs Lab 03/10/15 0316  GLUCAP 142*    Imaging Dg Chest Portable 1 View  03/10/2015   CLINICAL DATA:  Increasing shortness of breath. Decreased oxygen saturation. Symptoms for 3 days.  EXAM: PORTABLE CHEST - 1 VIEW  COMPARISON:  01/21/2015  FINDINGS: Right central line remains in place, tip in the SVC. Patient is post median sternotomy. Lower lung volumes from prior exam. Cardiomegaly is again seen. Development of diffuse pulmonary edema. Hazy opacity  at the lung bases consistent pleural effusions. Bibasilar opacities, likely atelectasis.  IMPRESSION: Findings consistent with CHF exacerbation.   Electronically Signed   By: Jeb Levering M.D.   On: 03/10/2015 00:28     ASSESSMENT / PLAN:  PULMONARY A: Acute on chronic hypercarbic and hypoxemic respiratory failure - on 6L O2 chronically HCAP Severe PAH - on Flolan (followed by Dr. Gilles Chiquito at [facility])  Restrictive lung disease P:   Continue BiPAP. Monitor clinical response. If no improvement, will likely require intubation. Abx per ID section. Albuterol PRN. Continue Flolan per protocol. CXR in AM.  CARDIOVASCULAR A:  Hypotension Chronic dCHF - last echo from 02/21/14 at [facility] with increase in estimated RVSP and slight decrease in RV function Elevated BNP Troponin leak - demand ischemia vs NSTEMI Hx CAD s/p CABG P:  Would not aggressively resuscitate further given diastolic dysfunction. May require CVL and vasopressors if BP falls further. Lasix restricted by hypotension. Trend troponin. Continue outpatient plavix once able to take PO. Hold outpatient furosemide, ezetemibe - simvastatin, imdur, toprol xl, nitro.  RENAL A:   CKD ? Hypocalcemia - no albumin resulted so unable to calculate accuracy P:   Send ionized calcium. BMP in AM.  GASTROINTESTINAL A:   GERD Nutrition P:   Pantoprazole. NPO for now.  HEMATOLOGIC A:   Anemia - chronic VTE Prophylaxis P:  Transfuse for Hgb < 7. SCD's / Heparin. CBC in AM.  INFECTIOUS A:   HCAP P:   BCx2 5/19 >>> Sputum Cx 5/19 >>> Abx: Vanc, start date 5/19, day 1/x. Abx: Zosyn, start date 5/19, day 1/x. PCT algorithm to limit abx exposure.  ENDOCRINE A:   DM Hypothyroidism   P:   CBG's q4hr. SSI. Continue outpatient levothyroxine, change to IV form at 1/2 home dose. Hold outpatient Lantus.  NEUROLOGIC A:   Acute hypercarbic encephalopathy Chronic pain P:   Monitor clinically. Hold outpatient xanax,  duloxetine, dilaudid.   Family updated: Husband at bedside.  Interdisciplinary Family Meeting v Palliative Care Meeting:  Due by: 5/25.   Montey Hora, [place] [facility] Pager: [phone]  or [phone] 03/10/2015, 4:26 AM  Attending:  I have seen and examined the patient with nurse practitioner/resident and agree with the note above.   Meagan [name] has a long history of severe CAD and pulmonary hypertension and diastolic dysfunction who has been managed by Dr. Gilles Chiquito at [facility] for several years in conjunction with our advanced heart failure team here at [facility].  Recently her flolan doses have been adjusted based on symptoms felt to be possibly related to her flolan (body aches, jaw pain).  In April her dose was increased to 76 ml/day from 6ml/day in response to worsening PA pressures from 12/2014 RHC and then in May she complained of jaw pain and body aches so the dose was decreased slightly to 23ml/day which works out to be 32.57ng/kg/min.  It was speculated that some of her symptoms could be an anginal equivalent.  She was noted earlier this month to be profoundly anemic so she received a blood transfusion (2U PRBC) was administered on 5/13.  She came to the ED on 5/18 at [place] complaining of three days of dyspnea.  She was noted to have an abnormal CXR and leukocytosis so she was labeled as HCAP, placed on BIPAP and given 1 L NS.  She was brought to [facility] where she was intubated not long after arrival due to worsening hypoxemic respiratory failure.  Around the time of intubation she received another liter of NS.  Since intubation she has been hypotensive.  We have noted that her current Flolan flow rate is currently 60ml/day.  We have been unable to discuss this with her husband.  On my exam she has coarse rhonchi, her oxygenation has improved with mechanical ventilation, her ext are cool but she is mentating  well.  CXR reviewed> worsening interstitial  edema and pleural effusions compared to prior studies.  Impression/Plan: 1) Acute hypoxemic respiratory failure> certainly HCAP is a reasonable consideration but I am more concerned about the possibility of acute decopmensated RV and LV failure precipitated by receiving 2 U PRBC last week as she has very little reserve for changes in volume.  May need a RHC to sort out.  Acutely I'm afraid the 2L crystalloid this morning has made her forward flow and heart failure worse  At this point will: -treat for HCAP -check procalcitonin -check echo to look for worsening RV overload -hold further IVF -give lasix now -add dopamine now -place arterial line -continue flolan at 52ml/day until we can sort out current dosing with husband -full mechanical vent support -check CVP, coox, lactic acid -advanced heart failure consult  Rest as above per NP note  My cc time 60 minutes  Roselie Awkward, MD [place] [facility] Pager: [phone] Cell: [phone] If no response, call [phone]

## 2015-03-10 NOTE — Procedures (Signed)
Intubation Procedure Note LORRIANN HANSMANN [id number] [birth year]  Procedure: Intubation Indications: Airway protection and maintenance  Procedure Details Consent: Risks of procedure as well as the alternatives and risks of each were explained to the (patient/caregiver).  Consent for procedure obtained. MD spoke w/ pt husband at bedside prior to intubation. Time Out: Verified patient identification, verified procedure, site/side was marked, verified correct patient position, special equipment/implants available, medications/allergies/relevent history reviewed, required imaging and test results available.  Performed  Maximum sterile technique was used including gloves and hand hygiene.  MAC and 3  Dr Alva Garnet intubated pt w/ no apparent complications.   Evaluation Hemodynamic Status: BP stable throughout; O2 sats: stable throughout Patient's Current Condition: stable Complications: No apparent complications Patient did tolerate procedure well. Chest X-ray ordered to verify placement.  CXR: pending.   Lenna Sciara 03/10/2015

## 2015-03-10 NOTE — Progress Notes (Signed)
Initial Nutrition Assessment  DOCUMENTATION CODES:  Severe malnutrition in context of chronic illness  INTERVENTION:  Initiate TF via OG tube with Vital High Protein at 25 ml/h and Prostat 30 ml BID on day 1; on day 2, increase to goal rate of 50 ml/h (1200 ml per day) to provide 1440 kcals, 90 gm protein, 973 ml free water daily.   NUTRITION DIAGNOSIS:  Malnutrition related to chronic illness as evidenced by energy intake < or equal to 75% for > or equal to 1 month, 7 percent weight loss in 2 months.   GOAL:  Patient will meet greater than or equal to 90% of their needs   MONITOR:  TF tolerance, Skin, Vent status, Labs, Weight trends  REASON FOR ASSESSMENT:  Consult Enteral/tube feeding initiation and management  ASSESSMENT:  Admitted with HCAP, pt with hx severe PAH and is on Flolan as outpatient and followed by [facility], also obesity, CAD s/p CABG and stent.  Pt admitted after fall 07/2014 and treated for right hip fx, d/c'ed to rehab, + cdiff and d/c home 08/16/14.   Patient is currently intubated on ventilator support MV: 7.8 L/min Temp (24hrs), Avg:99.1 F (37.3 C), Min:89.5 F (31.9 C), Max:100.7 F (38.2 C)  Nutrition-Focused physical exam completed. Findings are no fat depletion, no muscle depletion, and no edema.   Labs reviewed:  magnesium and phosphorus low Medications reviewed and include: lasix MAP 70 Has OG tube in place.  Pt has stage II wound on buttocks  Husband and sister at bedside. Per their report pt has not ate well in a few months. Per husband started moving less and eating less after Christmas.  Breakfast varies and pt does not eat breakfast food. She will often times have a peanut butter and jelly but only a 1/2 sandwich, lunch she sometimes skips and dinner is at home with husband and sister. They both report that she eats no more than a handful of food at each meal.  They report 10 lb weight loss during the last couple of months with a  usual weight of 145 lb. 7% weight loss x 2 months.   Height:  Ht Readings from Last 1 Encounters:  03/10/15 5' (1.524 m)    Weight:  Wt Readings from Last 1 Encounters:  03/10/15 135 lb (61.236 kg)    Ideal Body Weight:  45.4 kg  Wt Readings from Last 10 Encounters:  03/10/15 135 lb (61.236 kg)  03/03/15 135 lb (61.236 kg)  01/03/15 143 lb (64.864 kg)  12/07/14 142 lb (64.411 kg)  08/17/14 141 lb 1.5 oz (64 kg)  08/03/14 145 lb 15.1 oz (66.2 kg)  06/08/14 155 lb 3.3 oz (70.4 kg)  02/25/14 150 lb 5.7 oz (68.2 kg)  10/06/13 151 lb 12 oz (68.833 kg)  05/25/13 144 lb 12.8 oz (65.681 kg)    BMI:  Body mass index is 26.37 kg/(m^2).  Estimated Nutritional Needs:  Kcal:  1425  Protein:  90-100 grams  Fluid:  > 1.5 L/day  Skin:  Wound (see comment) (stage II buttocks)  Diet Order:  Diet NPO time specified  EDUCATION NEEDS:  No education needs identified at this time   Intake/Output Summary (Last 24 hours) at 03/10/15 1701 Last data filed at 03/10/15 1425  Gross per 24 hour  Intake 419.47 ml  Output    905 ml  Net -485.53 ml    Last BM:  PTA  Maylon Peppers RD, [place], [place] Pager [phone] After Hours Pager

## 2015-03-10 NOTE — Progress Notes (Signed)
Weaned fio2 to 60% d/t ABG po2, sat 98-100% currently. RN/MD aware.

## 2015-03-10 NOTE — Procedures (Signed)
PROCEDURE NOTE: CVL PLACEMENT  INDICATION:    Monitoring of central venous pressures and/or administration of medications optimally administered in central vein  CONSENT:   Risks of procedure as well as the alternatives were explained to the patient or surrogate. Consent for procedure obtained. A time out was performed.   PROCEDURE  Sterile technique was used including antiseptics, cap, gloves, gown, hand hygiene, mask and full body sheet.  Skin prep: Chlorhexidine; local anesthetic administered  A triple lumen catheter was placed in the L [place] vein using the Seldinger technique.  Ultrasound was used for vessel identification and guidance.   EVALUATION:  Blood flow good  Complications: No apparent complications  Patient tolerated the procedure well.  Chest X-ray reveals proper position and no PTX   Merton Border, MD PCCM service Mobile [phone]

## 2015-03-10 NOTE — Progress Notes (Signed)
Sputum sample collected, labeled and sent to lab w/ approp. Requisition.

## 2015-03-10 NOTE — Progress Notes (Signed)
Vent changes made per Dr. Lake Bells vent orders.  RN aware, pt appears to tol well so far. VSS, sat 100%

## 2015-03-10 NOTE — Progress Notes (Signed)
Echocardiogram 2D Echocardiogram has been performed.  Joelene Millin 03/10/2015, 12:17 PM

## 2015-03-10 NOTE — Care Management Note (Signed)
Case Management Note  Patient Details  Name: DOREAN HIEBERT MRN: [id number] Date of Birth: [birth year]  Subjective/Objective:                  Patient lives at home with husband.  Sister Sunday Spillers in room with her, husband is home resting after being with patient all night.  Patient is on flolan at home.  Action/Plan:   Expected Discharge Date:                  Expected Discharge Plan:  [place]  In-House Referral:     Discharge planning Services     Post Acute Care Choice:    Choice offered to:     DME Arranged:    DME Agency:     HH Arranged:    [place] Agency:     Status of Service:  In process, will continue to follow  Medicare Important Message Given:    Date Medicare IM Given:    Medicare IM give by:    Date Additional Medicare IM Given:    Additional Medicare Important Message give by:     If discussed at [facility] of Stay Meetings, dates discussed:    Additional Comments:  Vergie Living, RN 03/10/2015, 2:38 PM

## 2015-03-10 NOTE — Progress Notes (Signed)
Patient intubated and central line placed. 30mcg fentanyl, 2mg  versed, 50mg  rocuronium, and 20mg  amidate given per MD Simonds.

## 2015-03-10 NOTE — Progress Notes (Signed)
Ordered to give patient epoprostenol 86mcg injection at 0345. However patient receives a continuous dose from home through a right chest central line. Pharmacy notified. I was instructed not to give ordered dose. Pharmacy spoke with husband at bedside about administration.

## 2015-03-10 NOTE — Progress Notes (Addendum)
ANTIBIOTIC CONSULT NOTE - INITIAL  Pharmacy Consult for Vancomycin and Zosyn Indication: ?HCAP  Allergies  Allergen Reactions  . Aspirin     REACTION: Intolerance  . Codeine Other (See Comments)    Messes with digestive system  . Niacin Other (See Comments)    Pins sticking into skin  . Nsaids Other (See Comments)    REACTION: Aggravates Digestive System   . Phenergan Fortis [Promethazine] Other (See Comments)    Family and pt states becomes very confused and wild.   . Sulfonamide Derivatives Other (See Comments)    Messes with digestive system    Patient Measurements: Height: 5' (152.4 cm) (from 03/03/15 record) Weight: 135 lb (61.236 kg) (from 03/03/15 record) IBW/kg (Calculated) : 45.5   Vital Signs: Temp: 99.5 F (37.5 C) (05/19 0157) Temp Source: Other (Comment) (05/19 0157) BP: 112/45 mmHg (05/19 0157) Pulse Rate: 114 (05/19 0157) Intake/Output from previous day:   Intake/Output from this shift:    Labs:  Recent Labs  03/16/2015 2330  WBC 25.3*  HGB 12.6  PLT 299  CREATININE 1.18*   Estimated Creatinine Clearance: 36.3 mL/min (by C-G formula based on Cr of 1.18). No results for input(s): VANCOTROUGH, VANCOPEAK, VANCORANDOM, GENTTROUGH, GENTPEAK, GENTRANDOM, TOBRATROUGH, TOBRAPEAK, TOBRARND, AMIKACINPEAK, AMIKACINTROU, AMIKACIN in the last 72 hours.   Microbiology: Recent Results (from the past 720 hour(s))  Culture, blood (routine x 2)     Status: None (Preliminary result)   Collection Time: 03/10/15 12:34 AM  Result Value Ref Range Status   Specimen Description BLOOD RIGHT ANTECUBITAL  Final   Special Requests   Final    BOTTLES DRAWN AEROBIC AND ANAEROBIC AEB [place] ANA 5CC   Culture PENDING  Incomplete   Report Status PENDING  Incomplete  Culture, blood (routine x 2)     Status: None (Preliminary result)   Collection Time: 03/10/15 12:40 AM  Result Value Ref Range Status   Specimen Description BLOOD LEFT WRIST  Final   Special Requests BOTTLES DRAWN  AEROBIC AND ANAEROBIC 5CC EACH  Final   Culture PENDING  Incomplete   Report Status PENDING  Incomplete    Medical History: Past Medical History  Diagnosis Date  . Fibromyalgia   . Hypercholesterolemia   . Hypertension     pulmonary  pap 110/31 (mean 62)by RHC 9/10 negative PE by chest CT 2010  . Pulmonary edema     Failed Revatio   . ARF (acute renal failure)     poor candidate for ACE -I or ARB  . Restrictive lung disease     obesity hypoventilation syndrome  . Renal insufficiency   . CAD (coronary artery disease)     multivessel DES x 2 RCA 2007  . Chronic diastolic heart failure   . MRSA (methicillin resistant staphylococcus aureus) pneumonia   . Pulmonary hypertension   . Chronic anemia     anemia of chronic disease based on anemia panel 2011  . Pancreatic mass 08/01/2012  . PONV (postoperative nausea and vomiting)   . Heart murmur     "when I was small"  . CHF (congestive heart failure)   . Anginal pain   . Pneumonia X 3  . Type II diabetes mellitus   . Hypothyroidism   . H/O hiatal hernia   . GERD (gastroesophageal reflux disease)   . Anxiety   . On home oxygen therapy     "6L all the time" (02/24/2014  . Carotid artery disease     Medications:  Prescriptions prior  to admission  Medication Sig Dispense Refill Last Dose  . ALPRAZolam (XANAX) 0.5 MG tablet Take 2 tablets (1 mg total) by mouth at bedtime. For sleep. *Prescribed one tablet three times daily as needed* 30 tablet 0 01/24/2015 at Unknown time  . clopidogrel (PLAVIX) 75 MG tablet Take 1 tablet (75 mg total) by mouth daily. 90 tablet 3 01/25/2015 at 0700  . diclofenac sodium (VOLTAREN) 1 % GEL Apply 2 g topically 4 (four) times daily. 1 Tube 1 Past Week at Unknown time  . DULoxetine (CYMBALTA) 30 MG capsule Take 1 capsule (30 mg total) by mouth at bedtime. 30 capsule 3 01/25/2015 at Unknown time  . DULoxetine (CYMBALTA) 60 MG capsule Take 1 capsule (60 mg total) by mouth every morning. 30 capsule 3 01/25/2015 at  Unknown time  . Epoprostenol Sodium (FLOLAN IV) Inject into the vein. 1.5 mg. Inject into the vein continuously. Infuse mg/kg/min via cont IV pump.   01/25/2015 at Unknown time  . ezetimibe-simvastatin (VYTORIN) 10-40 MG per tablet Take 1 tablet by mouth every evening. 30 tablet 1 Past Week at Unknown time  . furosemide (LASIX) 40 MG tablet Take 2 tablets (80 mg total) by mouth daily. 30 tablet 1 01/25/2015 at 0700  . HYDROmorphone (DILAUDID) 2 MG tablet Take 2 tablets (4 mg total) by mouth every 6 (six) hours as needed for severe pain. (Patient not taking: Reported on 01/24/2015) 30 tablet 0 Not Taking at Unknown time  . HYDROmorphone (DILAUDID) 4 MG tablet Take 8 mg by mouth every 6 (six) hours as needed for moderate pain or severe pain.   01/25/2015 at 0700  . insulin glargine (LANTUS) 100 UNIT/ML injection Inject 0.08 mLs (8 Units total) into the skin at bedtime. (Patient taking differently: Inject 26 Units into the skin daily. ) 10 mL 11 01/24/2015 at Unknown time  . isosorbide mononitrate (IMDUR) 60 MG 24 hr tablet Take 1 tablet (60 mg total) by mouth 2 (two) times daily. 60 tablet 3 01/25/2015 at 0700  . lansoprazole (PREVACID) 15 MG capsule Take 1 capsule (15 mg total) by mouth daily at 12 noon. 90 capsule 3 01/24/2015 at Unknown time  . levothyroxine (SYNTHROID, LEVOTHROID) 25 MCG tablet Take 1 tablet (25 mcg total) by mouth every morning. 30 tablet 1 01/25/2015 at 0700  . metoprolol succinate (TOPROL-XL) 50 MG 24 hr tablet Take 1 tablet (50 mg total) by mouth daily. Take with or immediately following a meal. (Patient taking differently: Take 25 mg by mouth daily. Take with or immediately following a meal.) 30 tablet 1 01/25/2015 at 0700  . NITROSTAT 0.4 MG SL tablet TAKE ONE TABLET UNDER THE TONGUE AS NEEDED FOR CHEST PAIN. MAY REPEAT IN 5 MINUTES IF NEEDED 25 tablet 3   . Oxygen Permeable Lens Products SOLN Inhale into the lungs continuous. 6 liters   Taking  . OXYGEN Inhale 6 L into the lungs continuous.    01/25/2015 at Unknown time  . Oxymetazoline HCl (NASAL SPRAY) 0.05 % SOLN Place 1 spray into the nose as needed (for congestion).   Past Week at Unknown time  . potassium chloride SA (K-DUR,KLOR-CON) 10 MEQ tablet Take 4 tablets (40 mEq total) by mouth daily. 120 tablet 3 01/24/2015 at Unknown time  . Probiotic Product (FLORAJEN3 PO) Take 1 tablet by mouth daily.   01/24/2015 at Unknown time  . prochlorperazine (COMPAZINE) 5 MG tablet Take 1 tablet (5 mg total) by mouth every 6 (six) hours as needed for nausea or vomiting. [place]  tablet 0 01/25/2015 at 0700  . saccharomyces boulardii (FLORASTOR) 250 MG capsule Take 1 capsule (250 mg total) by mouth 2 (two) times daily. 60 capsule 0 Unknown at Unknown time  . traMADol (ULTRAM) 50 MG tablet Take 50 mg by mouth every 6 (six) hours as needed for moderate pain.   01/25/2015 at 0700   Scheduled:  . enoxaparin (LOVENOX) injection  40 mg Subcutaneous Daily  . insulin aspart  0-15 Units Subcutaneous 6 times per day   Assessment: 69 y.o female presented to ED on 5/18 PM complaining of SOB with onset 3 days ago, worsening.  Pharmacy consulted to dose vancomycin IV and zosyn for ?HCAP.  First doses have been given this AM.  SCr = 1.18, CrCl ~ 61 ml/min  Goal of Therapy:  Vancomycin trough level 15-20 mcg/ml  Plan:  Zosyn 3.75 g IV q8h (infuse each dose over 4 hours) Vancomycin 1000 mg IV q24h Monitor clinical status, renal function and culture results daily.  Nicole Cella, RPh Clinical Pharmacist Pager: [phone] 03/10/2015,3:56 AM

## 2015-03-10 NOTE — Procedures (Signed)
Oral Intubation Procedure Note   Procedure: Intubation Indications: Respiratory insufficiency, failed extubation Consent: obtained from husband Time Out: Verified patient identification, verified procedure, site/side was marked, verified correct patient position, special equipment/implants available, medications/allergies/relevent history reviewed, required imaging and test results available.   Pre-meds: Etomidate 20 mg IV  Neuromuscular blockade: Rocuronium 50 mg IV  Laryngoscope: #3 MAC  Visualization: cords partially visualized visualized  ETT: 7.5 ETT passed on first attempt and secured @ 23 cm at corner of mouth  Findings: normal upper airway. Slightly anterior but well visualized with cricoid pressure   Evaluation:  CXR revealed proper position of ETT  Pt tolerated procedure well without complications   Merton Border, MD ; [facility] service Mobile [phone].  After 5:30 PM or weekends, call [phone]

## 2015-03-10 NOTE — ED Notes (Signed)
CRITICAL VALUE ALERT  Critical value received:  PH-7.376, CO2-62.7, O2-60, Bicarb-35.9, Sat-89.2  Date of notification:  03/10/15  Time of notification:  0109  Critical value read back:Yes.    Nurse who received alert:  Y. Granger Chui, RN  MD notified (1st page):  Dr. Dina Rich  Time of first page:  0110 hrs  Responding MD:  Dr. Dina Rich  Time MD responded:  0110 hrs

## 2015-03-11 ENCOUNTER — Inpatient Hospital Stay (HOSPITAL_COMMUNITY): Payer: Medicare Other

## 2015-03-11 DIAGNOSIS — J189 Pneumonia, unspecified organism: Secondary | ICD-10-CM

## 2015-03-11 DIAGNOSIS — R6521 Severe sepsis with septic shock: Secondary | ICD-10-CM

## 2015-03-11 LAB — CARBOXYHEMOGLOBIN
Carboxyhemoglobin: 1.3 % (ref 0.5–1.5)
Methemoglobin: 1.1 % (ref 0.0–1.5)
O2 Saturation: 72.3 %
Total hemoglobin: 10.9 g/dL — ABNORMAL LOW (ref 12.0–16.0)

## 2015-03-11 LAB — GLUCOSE, CAPILLARY
GLUCOSE-CAPILLARY: 138 mg/dL — AB (ref 65–99)
GLUCOSE-CAPILLARY: 167 mg/dL — AB (ref 65–99)
Glucose-Capillary: 186 mg/dL — ABNORMAL HIGH (ref 65–99)
Glucose-Capillary: 149 mg/dL — ABNORMAL HIGH (ref 65–99)
Glucose-Capillary: 163 mg/dL — ABNORMAL HIGH (ref 65–99)
Glucose-Capillary: 187 mg/dL — ABNORMAL HIGH (ref 65–99)
Glucose-Capillary: 196 mg/dL — ABNORMAL HIGH (ref 65–99)

## 2015-03-11 LAB — BASIC METABOLIC PANEL
ANION GAP: 10 (ref 5–15)
BUN: 19 mg/dL (ref 6–20)
CO2: 33 mmol/L — ABNORMAL HIGH (ref 22–32)
Calcium: 8.4 mg/dL — ABNORMAL LOW (ref 8.9–10.3)
Chloride: 92 mmol/L — ABNORMAL LOW (ref 101–111)
Creatinine, Ser: 0.93 mg/dL (ref 0.44–1.00)
GFR calc Af Amer: >60 mL/min (ref >60)
GLUCOSE: 190 mg/dL — AB (ref 65–99)
POTASSIUM: 3 mmol/L — AB (ref 3.5–5.1)
SODIUM: 135 mmol/L (ref 135–145)

## 2015-03-11 LAB — CBC WITH DIFFERENTIAL/PLATELET
BASOS ABS: 0 10*3/uL (ref 0.0–0.1)
Basophils Relative: 0 % (ref 0–1)
EOS PCT: 0 % (ref 0–5)
Eosinophils Absolute: 0 10*3/uL (ref 0.0–0.7)
HEMATOCRIT: 33.5 % — AB (ref 36.0–46.0)
Hemoglobin: 10.7 g/dL — ABNORMAL LOW (ref 12.0–15.0)
LYMPHS PCT: 3 % — AB (ref 12–46)
Lymphs Abs: 0.6 10*3/uL — ABNORMAL LOW (ref 0.7–4.0)
MCH: 25.6 pg — ABNORMAL LOW (ref 26.0–34.0)
MCHC: 31.9 g/dL (ref 30.0–36.0)
MCV: 80.1 fL (ref 78.0–100.0)
MONO ABS: 0.9 10*3/uL (ref 0.1–1.0)
Monocytes Relative: 6 % (ref 3–12)
Neutro Abs: 14.5 10*3/uL — ABNORMAL HIGH (ref 1.7–7.7)
Neutrophils Relative %: 91 % — ABNORMAL HIGH (ref 43–77)
Platelets: 261 10*3/uL (ref 150–400)
RBC: 4.18 MIL/uL (ref 3.87–5.11)
RDW: 16.4 % — AB (ref 11.5–15.5)
WBC: 16 10*3/uL — ABNORMAL HIGH (ref 4.0–10.5)

## 2015-03-11 LAB — URINE CULTURE
CULTURE: NO GROWTH
Colony Count: NO GROWTH

## 2015-03-11 LAB — MAGNESIUM
MAGNESIUM: 1.5 mg/dL — AB (ref 1.7–2.4)
Magnesium: 1.5 mg/dL — ABNORMAL LOW (ref 1.7–2.4)
Magnesium: 2.8 mg/dL — ABNORMAL HIGH (ref 1.7–2.4)
Magnesium: 3 mg/dL — ABNORMAL HIGH (ref 1.7–2.4)

## 2015-03-11 LAB — PHOSPHORUS
PHOSPHORUS: 3 mg/dL (ref 2.5–4.6)
PHOSPHORUS: 3 mg/dL (ref 2.5–4.6)
Phosphorus: 2.9 mg/dL (ref 2.5–4.6)
Phosphorus: 3.1 mg/dL (ref 2.5–4.6)

## 2015-03-11 LAB — PROCALCITONIN: Procalcitonin: 0.21 ng/mL

## 2015-03-11 LAB — CALCIUM, IONIZED: CALCIUM, IONIZED, SERUM: 4.3 mg/dL — AB (ref 4.5–5.6)

## 2015-03-11 MED ORDER — DIPHENHYDRAMINE HCL 50 MG/ML IJ SOLN
25.0000 mg | Freq: Once | INTRAMUSCULAR | Status: AC
  Administered 2015-03-11: 25 mg via INTRAVENOUS
  Filled 2015-03-11: qty 1

## 2015-03-11 MED ORDER — ONDANSETRON HCL 4 MG/2ML IJ SOLN
4.0000 mg | Freq: Four times a day (QID) | INTRAMUSCULAR | Status: DC | PRN
  Administered 2015-03-11 – 2015-03-18 (×17): 4 mg via INTRAVENOUS
  Filled 2015-03-11 (×19): qty 2

## 2015-03-11 MED ORDER — FENTANYL CITRATE (PF) 100 MCG/2ML IJ SOLN
50.0000 ug | INTRAMUSCULAR | Status: AC
  Administered 2015-03-11: 50 ug via INTRAVENOUS

## 2015-03-11 MED ORDER — MIDAZOLAM HCL 2 MG/2ML IJ SOLN
INTRAMUSCULAR | Status: AC
  Filled 2015-03-11: qty 2

## 2015-03-11 MED ORDER — SODIUM CHLORIDE 0.9 % IV SOLN
0.0000 ug/h | INTRAVENOUS | Status: DC
  Administered 2015-03-11: 25 ug/h via INTRAVENOUS
  Administered 2015-03-13: 100 ug/h via INTRAVENOUS
  Administered 2015-03-14: 200 ug/h via INTRAVENOUS
  Administered 2015-03-14 – 2015-03-15 (×2): 150 ug/h via INTRAVENOUS
  Filled 2015-03-11 (×4): qty 50

## 2015-03-11 MED ORDER — SODIUM CHLORIDE 0.9 % IV SOLN
6.0000 g | Freq: Once | INTRAVENOUS | Status: AC
  Administered 2015-03-11: 6 g via INTRAVENOUS
  Filled 2015-03-11: qty 12

## 2015-03-11 MED ORDER — HYDROMORPHONE HCL 2 MG PO TABS
4.0000 mg | ORAL_TABLET | Freq: Two times a day (BID) | ORAL | Status: DC
  Administered 2015-03-12: 4 mg via ORAL
  Filled 2015-03-11: qty 2

## 2015-03-11 MED ORDER — PH 12 STERILE DILUENT
32.5700 ng/kg/min | INTRAVENOUS | Status: DC
  Administered 2015-03-11 – 2015-03-20 (×9): 32.57 ng/kg/min via INTRAVENOUS
  Filled 2015-03-11 (×8): qty 15

## 2015-03-11 MED ORDER — FAMOTIDINE IN NACL 20-0.9 MG/50ML-% IV SOLN
20.0000 mg | INTRAVENOUS | Status: DC
  Administered 2015-03-11 – 2015-03-12 (×2): 20 mg via INTRAVENOUS
  Filled 2015-03-11 (×3): qty 50

## 2015-03-11 MED ORDER — MIDAZOLAM HCL 2 MG/2ML IJ SOLN
2.0000 mg | INTRAMUSCULAR | Status: AC
  Administered 2015-03-11: 2 mg via INTRAVENOUS

## 2015-03-11 MED ORDER — POTASSIUM CHLORIDE 20 MEQ/15ML (10%) PO SOLN
30.0000 meq | ORAL | Status: AC
  Administered 2015-03-11 (×2): 30 meq
  Filled 2015-03-11 (×2): qty 30

## 2015-03-11 NOTE — Progress Notes (Signed)
Advanced Heart Failure Rounding Note   Subjective:    Remains intubated. CXR unchanged.  Co-ox 72% WBC falling. Sputum GS ok. BCX NGTD  LVEF 60-65%, no WMA, mild RV and RA dilation, RVSP 68mm Hg   Objective:   Weight Range:  Vital Signs:   Temp:  [97.7 F (36.5 C)-100.1 F (37.8 C)] 97.7 F (36.5 C) (05/20 1200) Pulse Rate:  [94-115] 103 (05/20 1200) Resp:  [11-31] 20 (05/20 1200) BP: (74-165)/(33-70) 122/44 mmHg (05/20 1000) SpO2:  [94 %-100 %] 95 % (05/20 1200) Arterial Line BP: (100-146)/(42-60) 132/60 mmHg (05/20 1200) FiO2 (%):  [40 %-60 %] 40 % (05/20 1212) Weight:  [62.7 kg (138 lb 3.7 oz)] 62.7 kg (138 lb 3.7 oz) (05/20 0526)    Weight change: Filed Weights   03/10/15 0313 03/11/15 0526  Weight: 61.236 kg (135 lb) 62.7 kg (138 lb 3.7 oz)    Intake/Output:   Intake/Output Summary (Last 24 hours) at 03/11/15 1344 Last data filed at 03/11/15 1200  Gross per 24 hour  Intake 2183.26 ml  Output   1700 ml  Net 483.26 ml     Physical Exam: Physical Exam: General: intubated sedated HEENT: +ETT Neck: thick . Carotids 2+ bilat; no bruits. No lymphadenopathy or thryomegaly appreciated.  Cor: PMI nonpalpable Regular rate & rhythm. 2/6 AS radiates to bilateral carotids.  Hickman catheter ok Lungs: mild rhonchi Abdomen: obese. soft, nontender, nondistended. No hepatosplenomegaly. No bruits or masses. Good bowel sounds.  Extremities: no cyanosis, + clubbing, macular-papular erythematous rash on LEs, trace edema  Neuro: sedated  Telemetry: Sinus tach   Labs: Basic Metabolic Panel:  Recent Labs Lab 03/10/2015 2330 03/10/15 0417 03/10/15 1150 03/10/15 2351 03/11/15 0439 03/11/15 1154  NA 135 135  --   --  135  --   K 4.4 4.7  --   --  3.0*  --   CL 87* 90*  --   --  92*  --   CO2 40* 32  --   --  33*  --   GLUCOSE 154* 113*  --   --  190*  --   BUN 27* 22*  --   --  19  --   CREATININE 1.18* 1.01*  --   --  0.93  --   CALCIUM 8.6* 8.0*  --   --  8.4*   --   MG  --  1.5* 1.4* 1.5* 1.5* 2.8*  PHOS  --  3.2 2.4* 3.1 3.0 2.9    Liver Function Tests: No results for input(s): AST, ALT, ALKPHOS, BILITOT, PROT, ALBUMIN in the last 168 hours. No results for input(s): LIPASE, AMYLASE in the last 168 hours. No results for input(s): AMMONIA in the last 168 hours.  CBC:  Recent Labs Lab 03/15/2015 2330 03/10/15 0417 03/11/15 0439  WBC 25.3* 25.7* 16.0*  NEUTROABS 22.0*  --  14.5*  HGB 12.6 10.6* 10.7*  HCT 41.4 35.1* 33.5*  MCV 85.5 83.4 80.1  PLT 299 286 261    Cardiac Enzymes:  Recent Labs Lab 03/05/2015 2330 03/10/15 0417 03/10/15 0929 03/10/15 1500  TROPONINI 0.11* 0.10* 0.08* 0.08*    BNP: BNP (last 3 results)  Recent Labs  10/20/14 1400 02/28/2015 2330  BNP 224.2* 1442.0*    ProBNP (last 3 results) No results for input(s): PROBNP in the last 8760 hours.    Other results:  Imaging: Dg Chest Port 1 View  03/11/2015   CLINICAL DATA:  Bilateral pulmonary infiltrates.  EXAM: PORTABLE CHEST -  1 VIEW  COMPARISON:  Chest x-rays from 01/21/2015 to 03/10/2015  FINDINGS: Endotracheal tube is 17 mm above the carina. NG tube tip is below the diaphragm, probably in the distal stomach. Right central line tip is in the mid right atrium. Interstitial edema has improved bilaterally. Left central line tip is in the SVC. This is superimposed on chronic interstitial lung disease. CABG.  No effusions.  IMPRESSION: Improving interstitial pulmonary edema. No change in the position of the tubes and lines. Right central venous catheter tip is in the right atrium.   Electronically Signed   By: Lorriane Shire M.D.   On: 03/11/2015 07:18   Dg Chest Port 1 View  03/10/2015   CLINICAL DATA:  69 year old female with a history of endotracheal tube placement.  EXAM: PORTABLE CHEST - 1 VIEW  COMPARISON:  03/10/2015  FINDINGS: Cardiomediastinal silhouette unchanged. Surgical changes of prior median sternotomy and CABG.  Persistent bilateral interstitial  opacities, similar to comparison. No pneumothorax. Blunting of the bilateral costophrenic angle, likely pleural fluid.  Interval placement of endotracheal tube which terminates at the clavicular heads, measuring approximately 2 cm above the carina.  Interval placement of left subclavian central venous catheter, which terminates in the region of the superior vena cava.  Interval placement of gastric tube, terminating out of the field of view.  Surgical shunt tubing on the right chest wall.  IMPRESSION: Persistent bilateral interstitial opacities, potentially edema, atypical infection, or other nonspecific inflammatory/ infectious pneumonitis.  Interval placement of the endotracheal tube, which terminates suitably above the carina at the level of the clavicular heads.  Interval placement of gastric tube and left subclavian central line as above.  Signed,  Dulcy Fanny. Earleen Newport, DO  [facility]  [facility]   Electronically Signed   By: Corrie Mckusick D.O.   On: 03/10/2015 07:44   Dg Chest Port 1 View  03/10/2015   CLINICAL DATA:  Difficulty breathing  EXAM: PORTABLE CHEST - 1 VIEW  COMPARISON:  03/09/1959  FINDINGS: Cardiac shadow is at the upper limits of normal in size. Persistent right-sided Hickman catheter is noted. Postsurgical changes are again seen. Diffuse interstitial changes are noted throughout both lungs consistent with a degree of CHF but improved from the prior study. No acute bony abnormality is noted.  IMPRESSION: Improved pulmonary edema when compared with the prior exam. Persistent vascular congestion is noted.   Electronically Signed   By: Inez Catalina M.D.   On: 03/10/2015 07:27   Dg Chest Portable 1 View  03/10/2015   CLINICAL DATA:  Increasing shortness of breath. Decreased oxygen saturation. Symptoms for 3 days.  EXAM: PORTABLE CHEST - 1 VIEW  COMPARISON:  01/21/2015  FINDINGS: Right central line remains in place, tip in the SVC. Patient is post  median sternotomy. Lower lung volumes from prior exam. Cardiomegaly is again seen. Development of diffuse pulmonary edema. Hazy opacity at the lung bases consistent pleural effusions. Bibasilar opacities, likely atelectasis.  IMPRESSION: Findings consistent with CHF exacerbation.   Electronically Signed   By: Jeb Levering M.D.   On: 03/10/2015 00:28      Medications:     Scheduled Medications: . antiseptic oral rinse  7 mL Mouth Rinse q12n4p  . chlorhexidine  15 mL Mouth Rinse BID  . clopidogrel  75 mg Oral Daily  . famotidine (PEPCID) IV  20 mg Intravenous Q24H  . furosemide  40 mg Intravenous Daily  . heparin subcutaneous  5,000 Units Subcutaneous 3 times per  day  . ice pack  1 each Other Q6H  . insulin aspart  0-15 Units Subcutaneous 6 times per day  . levothyroxine  12.5 mcg Intravenous Daily  . magnesium sulfate LVP 250-500 ml  6 g Intravenous Once  . piperacillin-tazobactam (ZOSYN)  IV  3.375 g Intravenous 3 times per day  . vancomycin  1,000 mg Intravenous Q24H     Infusions: . DOPamine 10 mcg/kg/min (03/11/15 0449)  . epoprostenol 32.57 ng/kg/min (03/10/15 1730)  . feeding supplement (VITAL AF 1.2 CAL) 1,000 mL (03/11/15 0551)     PRN Medications:  sodium chloride, albuterol, fentaNYL (SUBLIMAZE) injection, fentaNYL (SUBLIMAZE) injection, ondansetron (ZOFRAN) IV   Assessment:   1. Acute on chronic respiratory failure - likely mixed picture 2. Acute on chronic diastolic HF 3. Pulmonary HTN on Flolan 4. CAD s/p CABG 5. HCAP 6. Hypomagensemia  Plan/Discussion:    CVP and co-ox are ok. Main issue seems to be severe HCAP. Agree with broad spectrum abx. Can diurese as needed. Continue Flolan at slightly reduced dose.   The patient is critically ill with multiple organ systems failure and requires high complexity decision making for assessment and support, frequent evaluation and titration of therapies, application of advanced monitoring technologies and  extensive interpretation of multiple databases.   Critical Care Time devoted to patient care services described in this note is 35 Minutes.   Length of Stay: 1   Bensimhon, Daniel MD 03/11/2015, 1:44 PM  Advanced Heart Failure Team Pager [phone] (M-F; [place])  Please contact [facility] for night-coverage after hours (4p -7a ) and weekends on [email or web address]

## 2015-03-11 NOTE — Progress Notes (Signed)
[facility] ADULT ICU REPLACEMENT PROTOCOL FOR AM LAB REPLACEMENT ONLY  The patient does apply for the [facility] Adult ICU Electrolyte Replacment Protocol based on the criteria listed below:   1. Is GFR >/= 40 ml/min? Yes.    Patient's GFR today is >60 2. Is urine output >/= 0.5 ml/kg/hr for the last 6 hours? Yes.   Patient's UOP is 1.34 ml/kg/hr 3. Is BUN < 60 mg/dL? Yes.    Patient's BUN today is 19 4. Abnormal electrolyte(s):Potassium 5. Ordered repletion with: Potassium per Protocol    Meagan [name] P 03/11/2015 6:48 AM

## 2015-03-11 NOTE — Progress Notes (Signed)
PULMONARY / CRITICAL CARE MEDICINE   Name: Meagan [name] MRN: [id number] DOB: 12/16/1945    ADMISSION DATE:  03/16/2015 CONSULTATION DATE:  03/11/2015  REFERRING MD :  EDP at [place]:  SOB  INITIAL PRESENTATION:  69 y.o. F with severe PH (on Flolan) brought to [facility] ED 5/19 with SOB.  Found to have HCAP and AoC hypercarbic respiratory failure.  Started on BiPAP and transferred to [facility] for further evaluation and management.     STUDIES:  12/2014 RHC RA = 16 RV = 85/5/18 PA = 80/20 (45) PCW = 22 Fick cardiac output/index = 4.3/2.6 PVR = 5.6 WU Ao sat = 89% PA sat = 45%, 47%  CXR 5/19 >>> low lung volumes, cardiomegaly, pulmonary edema. Echo 5/20 >> LVEF 60-65%, no WMA, mild RV and RA dilation, RVSP 77mm Hg  SIGNIFICANT EVENTS: 5/19 - admitted to [facility] with HCAP and AoC hypercarbic respiratory failure  SUBJECTIVE: Remains on dopamine, WBC improved, on vent  VITAL SIGNS: Temp:  [97 F (36.1 C)-100.1 F (37.8 C)] 99.2 F (37.3 C) (05/20 0300) Pulse Rate:  [94-109] 104 (05/20 0700) Resp:  [11-27] 19 (05/20 0700) BP: (63-135)/(30-61) 101/46 mmHg (05/20 0700) SpO2:  [94 %-100 %] 96 % (05/20 0700) Arterial Line BP: (100-146)/(38-59) 111/47 mmHg (05/20 0700) FiO2 (%):  [50 %-60 %] 50 % (05/20 0600) Weight:  [62.7 kg (138 lb 3.7 oz)] 62.7 kg (138 lb 3.7 oz) (05/20 0526) HEMODYNAMICS: CVP:  [7 mmHg-11 mmHg] 11 mmHg VENTILATOR SETTINGS: Vent Mode:  [-] PRVC FiO2 (%):  [50 %-60 %] 50 % Set Rate:  [20 bmp] 20 bmp Vt Set:  [360 mL] 360 mL PEEP:  [5 cmH20] 5 cmH20 Plateau Pressure:  [29 cmH20-31 cmH20] 31 cmH20 INTAKE / OUTPUT: Intake/Output      05/19 0701 - 05/20 0700 05/20 0701 - 05/21 0700   I.V. (mL/kg) 750.6 (12)    NG/GT 665    IV Piggyback 375    Total Intake(mL/kg) 1790.6 (28.6)    Urine (mL/kg/hr) 1680 (1.1)    Emesis/NG output 100 (0.1)    Total Output 1780     Net +10.6            PHYSICAL EXAMINATION:  Gen: chronically ill appearing, anxious on  vent HENT: NCAT in place, EOMi PULM: crackles bilateral laterally, diminished bases CV: RRR, slight systolic murmur GI: BS+, soft, nontender Derm: mild bruising, no rash Psyche: anxious, but nodding head to questions Neuro: maew, following commands   LABS:  CBC  Recent Labs Lab 02/26/2015 2330 03/10/15 0417 03/11/15 0439  WBC 25.3* 25.7* 16.0*  HGB 12.6 10.6* 10.7*  HCT 41.4 35.1* 33.5*  PLT 299 286 261   Coag's No results for input(s): APTT, INR in the last 168 hours. BMET  Recent Labs Lab 03/02/2015 2330 03/10/15 0417 03/11/15 0439  NA 135 135 135  K 4.4 4.7 3.0*  CL 87* 90* 92*  CO2 40* 32 33*  BUN 27* 22* 19  CREATININE 1.18* 1.01* 0.93  GLUCOSE 154* 113* 190*   Electrolytes  Recent Labs Lab 02/21/2015 2330  03/10/15 0417 03/10/15 1150 03/10/15 2351 03/11/15 0439  CALCIUM 8.6*  --  8.0*  --   --  8.4*  MG  --   < > 1.5* 1.4* 1.5* 1.5*  PHOS  --   < > 3.2 2.4* 3.1 3.0  < > = values in this interval not displayed. Sepsis Markers  Recent Labs Lab 03/10/15 0044 03/10/15 0455 03/10/15 0630  03/10/15 0908 03/10/15 1000  LATICACIDVEN 0.87 1.2  --   --  0.5  PROCALCITON  --   --  0.13 0.21  --    ABG  Recent Labs Lab 02/20/2015 2333 03/10/15 0055 03/10/15 1019  PHART 7.345* 7.376 7.386  PCO2ART 72.2* 62.7* 60.6*  PO2ART 62.5* 60* 198.0*   Liver Enzymes No results for input(s): AST, ALT, ALKPHOS, BILITOT, ALBUMIN in the last 168 hours. Cardiac Enzymes  Recent Labs Lab 03/10/15 0417 03/10/15 0929 03/10/15 1500  TROPONINI 0.10* 0.08* 0.08*   Glucose  Recent Labs Lab 03/10/15 1136 03/10/15 1544 03/10/15 1951 03/10/15 2348 03/11/15 0337 03/11/15 0730  GLUCAP 110* 83 151* 138* 186* 149*    Imaging 5/20 CXR > bilateral interstitial opacities and small pleural effusions essentially unchanged, ETT and CVL in place   ASSESSMENT / PLAN:  PULMONARY A: Acute on chronic hypercarbic and hypoxemic respiratory failure - due to pulm edema  vs HCAP> improving Severe PAH - on Flolan (followed by Dr. Gilles Chiquito at [facility]) Restrictive lung disease P:   Continue full vent support Daily SBT and WUA Continue diuresis See ID Vent VAP prevention bundle  CARDIOVASCULAR A:  Shock> septic more than cardiogenic> Coox OK 5/20  Chronic dCHF - echo 5/19 with minimal RVSP, minimal RV dilation Troponin leak - demand ischemia vs NSTEMI Hx CAD s/p CABG P:  Lasix daily  Continue flolan at dose Wean dopamine for MAP > 65  RENAL A:   CKD Hypomagnesemia and Hypokalemia P:   Replacing K, Mg today Monitor BMET and UOP Replace electrolytes as needed  GASTROINTESTINAL A:   GERD Nutrition P:   Pantoprazole for stress ulcer prophylaxis > change to Pepcid Continue tube feedings  HEMATOLOGIC A:   Anemia - chronic, stable VTE Prophylaxis P:  Transfuse for Hgb < 7 SCD's / Heparin CBC in AM  INFECTIOUS A:   HCAP P:   BCx2 5/19 >>> Sputum Cx 5/19 >>> Abx: Vanc, start date 5/19, day 2/x. Abx: Zosyn, start date 5/19, day 2/x.  PCT algorithm to limit abx exposure  ENDOCRINE A:   DM Hypothyroidism   P:   CBG's q4hr SSI Continue outpatient levothyroxine, change to IV form at 1/2 home dose  NEUROLOGIC A:   Acute hypercarbic encephalopathy Chronic pain Vent synchrony needs P:   RASS goal -1 Fentanyl intermittent Hold outpatient xanax, duloxetine, dilaudid   Family updated: Husband was updated at bedside by phone on 5/19, attempted to call all numbers listed 5/20   Interdisciplinary Family Meeting v Palliative Care Meeting:  Due by: 5/25.  My cc time 40 minutes  Roselie Awkward, MD [facility] Pager: [phone] Cell: [phone] If no response, call [phone]

## 2015-03-11 NOTE — Progress Notes (Signed)
eLink Physician-Brief Progress Note Patient Name: Meagan [name] DOB: 05-Aug-1946 MRN: [id number]   Date of Service  03/11/2015  HPI/Events of Note  Rash - worse since am C/o persistent pain inspite of int fent  eICU Interventions  Benadryl IV, dc vanc - may have to reassess need for zosyn i f persists fent gtt, resume 1/2 home dose dilaudid      Intervention Category Intermediate Interventions: Pain - evaluation and management;Other:  ALVA,RAKESH V. 03/11/2015, 6:13 PM

## 2015-03-11 NOTE — Progress Notes (Signed)
eLink Physician-Brief Progress Note Patient Name: Meagan [name] DOB: [birth year] MRN: [id number]   Date of Service  03/11/2015  HPI/Events of Note  Patient c/o nausea.   eICU Interventions  Will order Zofran.      Intervention Category Minor Interventions: Routine modifications to care plan (e.g. PRN medications for pain, fever)  Meagan [name] [place] 03/11/2015, 4:37 AM

## 2015-03-11 NOTE — Progress Notes (Signed)
Held pt per-tube dose of Dilaudid as Dr. Hayden Pedro ordered fentanyl drip along with versed. Communicated this to night shift.

## 2015-03-11 NOTE — Progress Notes (Signed)
Pt's skin gradually becoming more ruddy throughout the day. Alerted Dr. Elsworth Soho of this change per [place]. Gave dose of benadryl and alerted night shift of this. Will continue to monitor.

## 2015-03-12 ENCOUNTER — Inpatient Hospital Stay (HOSPITAL_COMMUNITY): Payer: Medicare Other

## 2015-03-12 DIAGNOSIS — Z978 Presence of other specified devices: Secondary | ICD-10-CM | POA: Insufficient documentation

## 2015-03-12 DIAGNOSIS — J189 Pneumonia, unspecified organism: Secondary | ICD-10-CM | POA: Insufficient documentation

## 2015-03-12 LAB — MAGNESIUM
MAGNESIUM: 2.7 mg/dL — AB (ref 1.7–2.4)
Magnesium: 2.4 mg/dL (ref 1.7–2.4)

## 2015-03-12 LAB — CBC WITH DIFFERENTIAL/PLATELET
Basophils Absolute: 0 10*3/uL (ref 0.0–0.1)
Basophils Relative: 0 % (ref 0–1)
EOS ABS: 0 10*3/uL (ref 0.0–0.7)
Eosinophils Relative: 0 % (ref 0–5)
HEMATOCRIT: 35 % — AB (ref 36.0–46.0)
HEMOGLOBIN: 11 g/dL — AB (ref 12.0–15.0)
LYMPHS PCT: 4 % — AB (ref 12–46)
Lymphs Abs: 0.7 10*3/uL (ref 0.7–4.0)
MCH: 25.3 pg — AB (ref 26.0–34.0)
MCHC: 31.4 g/dL (ref 30.0–36.0)
MCV: 80.5 fL (ref 78.0–100.0)
MONOS PCT: 7 % (ref 3–12)
Monocytes Absolute: 1.4 10*3/uL — ABNORMAL HIGH (ref 0.1–1.0)
Neutro Abs: 16.9 10*3/uL — ABNORMAL HIGH (ref 1.7–7.7)
Neutrophils Relative %: 89 % — ABNORMAL HIGH (ref 43–77)
Platelets: 309 10*3/uL (ref 150–400)
RBC: 4.35 MIL/uL (ref 3.87–5.11)
RDW: 16.8 % — AB (ref 11.5–15.5)
WBC: 19.1 10*3/uL — AB (ref 4.0–10.5)

## 2015-03-12 LAB — GLUCOSE, CAPILLARY
GLUCOSE-CAPILLARY: 179 mg/dL — AB (ref 65–99)
GLUCOSE-CAPILLARY: 214 mg/dL — AB (ref 65–99)
GLUCOSE-CAPILLARY: 221 mg/dL — AB (ref 65–99)
GLUCOSE-CAPILLARY: 258 mg/dL — AB (ref 65–99)
Glucose-Capillary: 170 mg/dL — ABNORMAL HIGH (ref 65–99)
Glucose-Capillary: 151 mg/dL — ABNORMAL HIGH (ref 65–99)

## 2015-03-12 LAB — BASIC METABOLIC PANEL
Anion gap: 9 (ref 5–15)
BUN: 18 mg/dL (ref 6–20)
CO2: 34 mmol/L — ABNORMAL HIGH (ref 22–32)
CREATININE: 0.86 mg/dL (ref 0.44–1.00)
Calcium: 8.2 mg/dL — ABNORMAL LOW (ref 8.9–10.3)
Chloride: 93 mmol/L — ABNORMAL LOW (ref 101–111)
GFR calc Af Amer: >60 mL/min (ref >60)
GFR calc non Af Amer: >60 mL/min (ref >60)
GLUCOSE: 200 mg/dL — AB (ref 65–99)
POTASSIUM: 3.2 mmol/L — AB (ref 3.5–5.1)
Sodium: 136 mmol/L (ref 135–145)

## 2015-03-12 LAB — CULTURE, RESPIRATORY
CULTURE: NO GROWTH
SPECIAL REQUESTS: NORMAL

## 2015-03-12 LAB — CARBOXYHEMOGLOBIN
Carboxyhemoglobin: 1.5 % (ref 0.5–1.5)
Methemoglobin: 1.2 % (ref 0.0–1.5)
O2 Saturation: 81.6 %
TOTAL HEMOGLOBIN: 11.4 g/dL — AB (ref 12.0–16.0)

## 2015-03-12 LAB — CULTURE, RESPIRATORY W GRAM STAIN

## 2015-03-12 LAB — PHOSPHORUS: PHOSPHORUS: 3.1 mg/dL (ref 2.5–4.6)

## 2015-03-12 LAB — PROCALCITONIN: PROCALCITONIN: 0.16 ng/mL

## 2015-03-12 MED ORDER — POTASSIUM CHLORIDE CRYS ER 20 MEQ PO TBCR
40.0000 meq | EXTENDED_RELEASE_TABLET | Freq: Once | ORAL | Status: DC
  Filled 2015-03-12: qty 2

## 2015-03-12 MED ORDER — POTASSIUM CHLORIDE ER 10 MEQ PO TBCR
20.0000 meq | EXTENDED_RELEASE_TABLET | Freq: Every day | ORAL | Status: DC
  Administered 2015-03-12: 20 meq via ORAL
  Filled 2015-03-12: qty 2

## 2015-03-12 MED ORDER — POTASSIUM CHLORIDE 20 MEQ/15ML (10%) PO SOLN
30.0000 meq | ORAL | Status: AC
  Administered 2015-03-12 (×2): 30 meq
  Filled 2015-03-12 (×2): qty 22.5

## 2015-03-12 MED ORDER — FUROSEMIDE 10 MG/ML IJ SOLN
40.0000 mg | Freq: Once | INTRAMUSCULAR | Status: AC
  Administered 2015-03-12: 40 mg via INTRAVENOUS
  Filled 2015-03-12: qty 4

## 2015-03-12 MED ORDER — LEVOFLOXACIN IN D5W 500 MG/100ML IV SOLN
500.0000 mg | INTRAVENOUS | Status: AC
  Administered 2015-03-12 – 2015-03-17 (×6): 500 mg via INTRAVENOUS
  Filled 2015-03-12 (×7): qty 100

## 2015-03-12 MED ORDER — DIPHENHYDRAMINE HCL 50 MG/ML IJ SOLN: 25.0000 mg | Freq: Three times a day (TID) | INTRAMUSCULAR | Status: DC | PRN

## 2015-03-12 MED ORDER — DEXMEDETOMIDINE HCL IN NACL 200 MCG/50ML IV SOLN
0.2000 ug/kg/h | INTRAVENOUS | Status: AC
  Administered 2015-03-12: 0.4 ug/kg/h via INTRAVENOUS
  Administered 2015-03-12: 0.2 ug/kg/h via INTRAVENOUS
  Administered 2015-03-13: 0.4 ug/kg/h via INTRAVENOUS
  Administered 2015-03-13 (×2): 0.6 ug/kg/h via INTRAVENOUS
  Filled 2015-03-12 (×5): qty 50

## 2015-03-12 MED ORDER — LEVOTHYROXINE SODIUM 50 MCG PO TABS
50.0000 ug | ORAL_TABLET | Freq: Every day | ORAL | Status: DC
  Administered 2015-03-13: 50 ug via ORAL
  Filled 2015-03-12 (×2): qty 1

## 2015-03-12 MED ORDER — POTASSIUM CHLORIDE 20 MEQ/15ML (10%) PO SOLN
20.0000 meq | Freq: Every day | ORAL | Status: DC
  Administered 2015-03-12 – 2015-03-15 (×4): 20 meq
  Filled 2015-03-12 (×4): qty 15

## 2015-03-12 MED ORDER — FAMOTIDINE 40 MG/5ML PO SUSR
20.0000 mg | Freq: Every day | ORAL | Status: DC
  Administered 2015-03-13 – 2015-03-14 (×2): 20 mg
  Filled 2015-03-12 (×5): qty 2.5

## 2015-03-12 MED ORDER — POTASSIUM CHLORIDE 20 MEQ/15ML (10%) PO SOLN
ORAL | Status: AC
  Filled 2015-03-12: qty 15

## 2015-03-12 MED ORDER — HYDRALAZINE HCL 20 MG/ML IJ SOLN
10.0000 mg | INTRAMUSCULAR | Status: DC | PRN
  Administered 2015-03-12 – 2015-03-13 (×2): 10 mg via INTRAVENOUS
  Filled 2015-03-12 (×3): qty 1

## 2015-03-12 NOTE — Progress Notes (Signed)
PULMONARY / CRITICAL CARE MEDICINE   Name: Meagan [name] MRN: [id number] DOB: Sep 24, 1946    ADMISSION DATE:  03/01/2015 CONSULTATION DATE:  03/12/2015  REFERRING MD :  EDP at [place]:  SOB  INITIAL PRESENTATION:  69 y.o. F with severe PH (on Flolan) brought to [facility] ED 5/19 with SOB.  Found to have HCAP and AoC hypercarbic respiratory failure.  Started on BiPAP and transferred to [facility] for further evaluation and management.     STUDIES:  12/2014 RHC RA = 16 RV = 85/5/18 PA = 80/20 (45) PCW = 22 Fick cardiac output/index = 4.3/2.6 PVR = 5.6 WU Ao sat = 89% PA sat = 45%, 47%  CXR 5/19 >>> low lung volumes, cardiomegaly, pulmonary edema. ECHO 5/20 >> LVEF 60-65%, no WMA, mild RV and RA dilation, RVSP 63mm Hg  SIGNIFICANT EVENTS: 5/19 - admitted to [facility] with HCAP and AoC hypercarbic respiratory failure    SUBJECTIVE:  RN reports pt developed rash with zosyn last night and was treated with benadryl, cleared somewhat but remains flushed / red.   Did not tolerate SBT - low lung volumes, hypertension.     VITAL SIGNS: Temp:  [89.3 F (31.8 C)-98.6 F (37 C)] 89.3 F (31.8 C) (05/21 0800) Pulse Rate:  [103-129] 129 (05/21 1000) Resp:  [11-32] 32 (05/21 1000) BP: (96-195)/(43-73) 128/51 mmHg (05/21 1000) SpO2:  [95 %-100 %] 98 % (05/21 1000) Arterial Line BP: (121-179)/(45-68) 154/62 mmHg (05/21 1000) FiO2 (%):  [40 %] 40 % (05/21 1000) Weight:  [138 lb 3.7 oz (62.7 kg)] 138 lb 3.7 oz (62.7 kg) (05/21 0500)   HEMODYNAMICS: CVP:  [8 mmHg-14 mmHg] 13 mmHg   VENTILATOR SETTINGS: Vent Mode:  [-] PRVC FiO2 (%):  [40 %] 40 % Set Rate:  [20 bmp] 20 bmp Vt Set:  [360 mL] 360 mL PEEP:  [5 cmH20] 5 cmH20 Pressure Support:  [5 cmH20] 5 cmH20 Plateau Pressure:  [27 cmH20-28 cmH20] 28 cmH20   INTAKE / OUTPUT: Intake/Output      05/20 0701 - 05/21 0700 05/21 0701 - 05/22 0700   I.V. (mL/kg) 520.9 (8.3) 17.3 (0.3)   NG/GT 1340 180   IV Piggyback 125    Total Intake(mL/kg) 1985.9  (31.7) 197.3 (3.1)   Urine (mL/kg/hr) 800 (0.5) 175 (0.7)   Emesis/NG output 1250 (0.8)    Total Output 2050 175   Net -64.1 +22.3          PHYSICAL EXAMINATION: Gen: chronically ill appearing, anxious on vent HENT: NCAT in place, EOMi PULM: even/non-labored, lungs bilaterally with rhonchi CV: RRR, 2/6 SEM, radiates to carotids  GI: BS+, soft, nontender Derm: mild bruising, no rash Psyche: anxious, but nodding head to questions Neuro: maew, following commands   LABS:  CBC  Recent Labs Lab 03/10/15 0417 03/11/15 0439 03/12/15 0340  WBC 25.7* 16.0* 19.1*  HGB 10.6* 10.7* 11.0*  HCT 35.1* 33.5* 35.0*  PLT 286 261 309   Coag's No results for input(s): APTT, INR in the last 168 hours.   BMET  Recent Labs Lab 03/10/15 0417 03/11/15 0439 03/12/15 0340  NA 135 135 136  K 4.7 3.0* 3.2*  CL 90* 92* 93*  CO2 32 33* 34*  BUN 22* 19 18  CREATININE 1.01* 0.93 0.86  GLUCOSE 113* 190* 200*   Electrolytes  Recent Labs Lab 03/10/15 0417  03/11/15 0439 03/11/15 1154 03/11/15 2250 03/12/15 0340  CALCIUM 8.0*  --  8.4*  --   --  8.2*  MG 1.5*  < > 1.5* 2.8* 3.0* 2.7*  PHOS 3.2  < > 3.0 2.9 3.0  --   < > = values in this interval not displayed.   Sepsis Markers  Recent Labs Lab 03/10/15 0044 03/10/15 0455  03/10/15 0908 03/10/15 1000 03/11/15 0439 03/12/15 0340  LATICACIDVEN 0.87 1.2  --   --  0.5  --   --   PROCALCITON  --   --   < > 0.21  --  0.21 0.16  < > = values in this interval not displayed. ABG  Recent Labs Lab 02/21/2015 2333 03/10/15 0055 03/10/15 1019  PHART 7.345* 7.376 7.386  PCO2ART 72.2* 62.7* 60.6*  PO2ART 62.5* 60* 198.0*   Liver Enzymes No results for input(s): AST, ALT, ALKPHOS, BILITOT, ALBUMIN in the last 168 hours.   Cardiac Enzymes  Recent Labs Lab 03/10/15 0417 03/10/15 0929 03/10/15 1500  TROPONINI 0.10* 0.08* 0.08*   Glucose  Recent Labs Lab 03/11/15 1140 03/11/15 1608 03/11/15 2017 03/12/15 0013  03/12/15 0338 03/12/15 0801  GLUCAP 163* 167* 196* 221* 170* 151*    Imaging 5/21 CXR > images reviewed, diffuse bilateral opacities L>R, stable lines  ASSESSMENT / PLAN:  PULMONARY A: Acute on chronic hypercarbic and hypoxemic respiratory failure - due to pulm edema vs HCAP,  improving Severe PAH - on Flolan (followed by Dr. Gilles Chiquito at [facility]) Restrictive lung disease P:   MV support, 8cc/kg Daily SBT and WUA Continue diuresis as renal function / BP permit  Tren CXR  See ID Continue Flolan per pharmacy   CARDIOVASCULAR A:  Shock - suspect septic more than cardiogenic  Co-ox 82% 5/21.  Resolved.  Hypertension - in setting of anxiety  Chronic dCHF - echo 5/19 with minimal RVSP, minimal RV dilation Troponin leak - demand ischemia vs NSTEMI Hx CAD s/p CABG P:  Lasix 40 mg QD  Continue flolan at dose Hydralazine to control BP  Lasix 40 mg QD, additional dose 5/21  RENAL A:   CKD Hypomagnesemia  Hypokalemia P:   Replace electrolytes as indicated,  KCL 5/21 Monitor BMET and UOP  GASTROINTESTINAL A:   GERD Nutrition P:   SUP:  Pepcid  Continue tube feedings  HEMATOLOGIC A:   Anemia - chronic, stable VTE Prophylaxis P:  Transfuse for Hgb < 7 SCD's / Heparin CBC in AM  INFECTIOUS A:   HCAP - LLL Drug Related Rash - with zosyn administration  P:   BCx2 5/19 >> Sputum Cx 5/19 >> UC 5/19 >> neg   Vanc, start date 5/19, day 3/x. Zosyn, start date 5/19, day 2/2 - d/c'd due to rash 5/21. Levaquin, start date 5/19, day 1/x   PCT algorithm to limit abx exposure PRN benadryl for itching / rash   ENDOCRINE A:   DM Hypothyroidism   P:   CBG's q4hr SSI Continue levothyroxine, change to IV form at 1/2 home dose  NEUROLOGIC A:   Acute hypercarbic encephalopathy - resolved 5/21 Chronic pain Vent synchrony needs P:   RASS goal 0 to -1 Fentanyl gtt + PRN Hold outpatient xanax, duloxetine, dilaudid    Family updated:  No family available at time  of rounds.  Attempted call but no response.    Interdisciplinary Family Meeting v Palliative Care Meeting:  Due by: 5/25.   Noe Gens, NP-C [facility] Pgr: [phone] or [phone]    PCCM ATTENDING: I have reviewed pt's initial presentation, consultants notes and hospital database in detail.  The above assessment and plan was formulated under my direction.  In summary: VDRF PAH CXR c/w interstitial edema RASS +1 - dexmedetomidine intiated No wheezes Failed SBT - tolerates PS 20 cm H20 Undergoing diuresis  Cont abx Cont daily WUA/SBT Husband updated in detail   40 minutes of independent CCM time was provided by me   Merton Border, MD;  PCCM service; Mobile [phone]

## 2015-03-12 NOTE — Progress Notes (Addendum)
Advanced Heart Failure Rounding Note   Subjective:    Awake on vent. Weaning. SBP 200. Wants tube out.  CVP 13   Co-ox 82% WBC falling. Sputum GS ok. BCX NGTD  LVEF 60-65%, no WMA, mild RV and RA dilation, RVSP 14mm Hg   Objective:   Weight Range:  Vital Signs:   Temp:  [89.3 F (31.8 C)-98.8 F (37.1 C)] 89.3 F (31.8 C) (05/21 0800) Pulse Rate:  [103-126] 117 (05/21 0810) Resp:  [11-28] 28 (05/21 0810) BP: (96-195)/(43-73) 195/73 mmHg (05/21 0810) SpO2:  [95 %-100 %] 98 % (05/21 0810) Arterial Line BP: (121-179)/(45-66) 179/66 mmHg (05/21 0800) FiO2 (%):  [40 %] 40 % (05/21 0810) Weight:  [62.7 kg (138 lb 3.7 oz)] 62.7 kg (138 lb 3.7 oz) (05/21 0500)    Weight change: Filed Weights   03/10/15 0313 03/11/15 0526 03/12/15 0500  Weight: 61.236 kg (135 lb) 62.7 kg (138 lb 3.7 oz) 62.7 kg (138 lb 3.7 oz)    Intake/Output:   Intake/Output Summary (Last 24 hours) at 03/12/15 0837 Last data filed at 03/12/15 0800  Gross per 24 hour  Intake 1926.91 ml  Output   2050 ml  Net -123.09 ml     Physical Exam: General: awake on vent . Sitting up. communicating HEENT: +ETT Neck: thick . Carotids 2+ bilat; no bruits. No lymphadenopathy or thryomegaly appreciated.  Cor: PMI nonpalpable Regular rate & rhythm. 2/6 AS radiates to bilateral carotids.  Hickman catheter ok Lungs: mild rhonchi Abdomen: obese. soft, nontender, nondistended. No hepatosplenomegaly. No bruits or masses. Good bowel sounds.  Extremities: no cyanosis, + clubbing, macular-papular erythematous rash on LEs, trace edema  Neuro:moves all 4 without difficulty  Telemetry: Sinus tach   Labs: Basic Metabolic Panel:  Recent Labs Lab 03/22/2015 2330  03/10/15 0417 03/10/15 1150 03/10/15 2351 03/11/15 0439 03/11/15 1154 03/11/15 2250 03/12/15 0340  NA 135  --  135  --   --  135  --   --  136  K 4.4  --  4.7  --   --  3.0*  --   --  3.2*  CL 87*  --  90*  --   --  92*  --   --  93*  CO2 40*  --  32   --   --  33*  --   --  34*  GLUCOSE 154*  --  113*  --   --  190*  --   --  200*  BUN 27*  --  22*  --   --  19  --   --  18  CREATININE 1.18*  --  1.01*  --   --  0.93  --   --  0.86  CALCIUM 8.6*  --  8.0*  --   --  8.4*  --   --  8.2*  MG  --   < > 1.5* 1.4* 1.5* 1.5* 2.8* 3.0* 2.7*  PHOS  --   < > 3.2 2.4* 3.1 3.0 2.9 3.0  --   < > = values in this interval not displayed.  Liver Function Tests: No results for input(s): AST, ALT, ALKPHOS, BILITOT, PROT, ALBUMIN in the last 168 hours. No results for input(s): LIPASE, AMYLASE in the last 168 hours. No results for input(s): AMMONIA in the last 168 hours.  CBC:  Recent Labs Lab 03/20/2015 2330 03/10/15 0417 03/11/15 0439 03/12/15 0340  WBC 25.3* 25.7* 16.0* 19.1*  NEUTROABS 22.0*  --  14.5* 16.9*  HGB 12.6 10.6* 10.7* 11.0*  HCT 41.4 35.1* 33.5* 35.0*  MCV 85.5 83.4 80.1 80.5  PLT 299 286 261 309    Cardiac Enzymes:  Recent Labs Lab 03/15/2015 2330 03/10/15 0417 03/10/15 0929 03/10/15 1500  TROPONINI 0.11* 0.10* 0.08* 0.08*    BNP: BNP (last 3 results)  Recent Labs  10/20/14 1400 03/07/2015 2330  BNP 224.2* 1442.0*    ProBNP (last 3 results) No results for input(s): PROBNP in the last 8760 hours.    Other results:  Imaging: Dg Chest Port 1 View  03/11/2015   CLINICAL DATA:  Bilateral pulmonary infiltrates.  EXAM: PORTABLE CHEST - 1 VIEW  COMPARISON:  Chest x-rays from 01/21/2015 to 03/10/2015  FINDINGS: Endotracheal tube is 17 mm above the carina. NG tube tip is below the diaphragm, probably in the distal stomach. Right central line tip is in the mid right atrium. Interstitial edema has improved bilaterally. Left central line tip is in the SVC. This is superimposed on chronic interstitial lung disease. CABG.  No effusions.  IMPRESSION: Improving interstitial pulmonary edema. No change in the position of the tubes and lines. Right central venous catheter tip is in the right atrium.   Electronically Signed   By:  Lorriane Shire M.D.   On: 03/11/2015 07:18     Medications:     Scheduled Medications: . antiseptic oral rinse  7 mL Mouth Rinse q12n4p  . chlorhexidine  15 mL Mouth Rinse BID  . clopidogrel  75 mg Oral Daily  . famotidine (PEPCID) IV  20 mg Intravenous Q24H  . furosemide  40 mg Intravenous Daily  . heparin subcutaneous  5,000 Units Subcutaneous 3 times per day  . HYDROmorphone  4 mg Oral BID  . ice pack  1 each Other Q6H  . insulin aspart  0-15 Units Subcutaneous 6 times per day  . levothyroxine  12.5 mcg Intravenous Daily  . piperacillin-tazobactam (ZOSYN)  IV  3.375 g Intravenous 3 times per day  . potassium chloride  30 mEq Per Tube Q4H  . potassium chloride        Infusions: . DOPamine Stopped (03/11/15 1814)  . epoprostenol 32.57 ng/kg/min (03/11/15 2000)  . feeding supplement (VITAL AF 1.2 CAL) 1,000 mL (03/11/15 0551)  . fentaNYL infusion INTRAVENOUS Stopped (03/12/15 0800)    PRN Medications: sodium chloride, albuterol, fentaNYL (SUBLIMAZE) injection, fentaNYL (SUBLIMAZE) injection, ondansetron (ZOFRAN) IV   Assessment:   1. Acute on chronic respiratory failure - likely mixed picture 2. Acute on chronic diastolic HF 3. Pulmonary HTN on Flolan 4. CAD s/p CABG 5. HCAP 6. Hypomagensemia/hypokalemia 7. Hypertension   Plan/Discussion:    Main issue seems to be severe HCAP. Now improving. Hopefully will extubate today. Agree with broad spectrum abx.  Will give extra dose IV lasix today with CVP 13.  Continue Flolan. PAP pressures look good on echo.   Will use hydralazine to control BP. Suspect most of it due to anxiety.    Length of Stay: 2 Glori Bickers MD 03/12/2015, 8:37 AM  Advanced Heart Failure Team Pager [phone] (M-F; [place])  Please contact [facility] for night-coverage after hours (4p -7a ) and weekends on [email or web address]

## 2015-03-12 NOTE — Progress Notes (Signed)
[facility] ADULT ICU REPLACEMENT PROTOCOL FOR AM LAB REPLACEMENT ONLY  The patient does apply for the [facility] Adult ICU Electrolyte Replacment Protocol based on the criteria listed below:   1. Is GFR >/= 40 ml/min? Yes.    Patient's GFR today is >60 2. Is urine output >/= 0.5 ml/kg/hr for the last 6 hours? Yes.   Patient's UOP is 1.40ml/kg/hr 3. Is BUN < 60 mg/dL? Yes.    Patient's BUN today is 18 4. Abnormal electrolyte(s):Potassium 5. Ordered repletion with: Potassium per protocol    Meagan [name] 03/12/2015 6:09 AM

## 2015-03-13 ENCOUNTER — Inpatient Hospital Stay (HOSPITAL_COMMUNITY): Payer: Medicare Other

## 2015-03-13 DIAGNOSIS — I5022 Chronic systolic (congestive) heart failure: Secondary | ICD-10-CM

## 2015-03-13 DIAGNOSIS — Z789 Other specified health status: Secondary | ICD-10-CM

## 2015-03-13 DIAGNOSIS — J9611 Chronic respiratory failure with hypoxia: Secondary | ICD-10-CM | POA: Insufficient documentation

## 2015-03-13 LAB — GLUCOSE, CAPILLARY
GLUCOSE-CAPILLARY: 181 mg/dL — AB (ref 65–99)
Glucose-Capillary: 171 mg/dL — ABNORMAL HIGH (ref 65–99)
Glucose-Capillary: 171 mg/dL — ABNORMAL HIGH (ref 65–99)
Glucose-Capillary: 186 mg/dL — ABNORMAL HIGH (ref 65–99)
Glucose-Capillary: 198 mg/dL — ABNORMAL HIGH (ref 65–99)
Glucose-Capillary: 210 mg/dL — ABNORMAL HIGH (ref 65–99)

## 2015-03-13 LAB — BASIC METABOLIC PANEL
ANION GAP: 8 (ref 5–15)
BUN: 22 mg/dL — AB (ref 6–20)
CALCIUM: 8.8 mg/dL — AB (ref 8.9–10.3)
CO2: 33 mmol/L — AB (ref 22–32)
Chloride: 98 mmol/L — ABNORMAL LOW (ref 101–111)
Creatinine, Ser: 0.89 mg/dL (ref 0.44–1.00)
GFR calc Af Amer: >60 mL/min (ref >60)
GLUCOSE: 205 mg/dL — AB (ref 65–99)
Potassium: 4.7 mmol/L (ref 3.5–5.1)
SODIUM: 139 mmol/L (ref 135–145)

## 2015-03-13 LAB — CARBOXYHEMOGLOBIN
CARBOXYHEMOGLOBIN: 1.1 % (ref 0.5–1.5)
Methemoglobin: 0.8 % (ref 0.0–1.5)
O2 SAT: 77.4 %
TOTAL HEMOGLOBIN: 10 g/dL — AB (ref 12.0–16.0)

## 2015-03-13 LAB — CBC
HCT: 32.9 % — ABNORMAL LOW (ref 36.0–46.0)
Hemoglobin: 10.1 g/dL — ABNORMAL LOW (ref 12.0–15.0)
MCH: 25.5 pg — ABNORMAL LOW (ref 26.0–34.0)
MCHC: 30.7 g/dL (ref 30.0–36.0)
MCV: 83.1 fL (ref 78.0–100.0)
PLATELETS: 259 10*3/uL (ref 150–400)
RBC: 3.96 MIL/uL (ref 3.87–5.11)
RDW: 17.3 % — ABNORMAL HIGH (ref 11.5–15.5)
WBC: 15.6 10*3/uL — AB (ref 4.0–10.5)

## 2015-03-13 LAB — MAGNESIUM: MAGNESIUM: 2.4 mg/dL (ref 1.7–2.4)

## 2015-03-13 LAB — PHOSPHORUS: Phosphorus: 3.6 mg/dL (ref 2.5–4.6)

## 2015-03-13 MED ORDER — FUROSEMIDE 10 MG/ML IJ SOLN
20.0000 mg | Freq: Once | INTRAMUSCULAR | Status: AC
  Administered 2015-03-13: 20 mg via INTRAVENOUS
  Filled 2015-03-13: qty 2

## 2015-03-13 MED ORDER — POTASSIUM CHLORIDE 20 MEQ/15ML (10%) PO SOLN
10.0000 meq | Freq: Once | ORAL | Status: AC
  Administered 2015-03-13: 10 meq
  Filled 2015-03-13: qty 7.5

## 2015-03-13 MED ORDER — LEVOTHYROXINE SODIUM 25 MCG PO TABS
25.0000 ug | ORAL_TABLET | Freq: Every day | ORAL | Status: DC
  Administered 2015-03-14 – 2015-03-19 (×5): 25 ug via ORAL
  Filled 2015-03-13 (×8): qty 1

## 2015-03-13 MED ORDER — DULOXETINE HCL 60 MG PO CPEP: 60.0000 mg | ORAL_CAPSULE | Freq: Every day | ORAL | Status: DC

## 2015-03-13 MED ORDER — DIPHENHYDRAMINE HCL 50 MG/ML IJ SOLN
12.5000 mg | Freq: Three times a day (TID) | INTRAMUSCULAR | Status: DC | PRN
Start: 2015-03-13 — End: 2015-03-21
  Administered 2015-03-13 – 2015-03-17 (×4): 12.5 mg via INTRAVENOUS
  Filled 2015-03-13 (×4): qty 1

## 2015-03-13 NOTE — Progress Notes (Signed)
Advanced Heart Failure Rounding Note   Subjective:    Failed vent wean yesterday. Sedated this am. SBP 101. CVP 7-8   Co-ox 77%. WBC falling.   LVEF 60-65%, no WMA, mild RV and RA dilation, RVSP 24mm Hg   Objective:   Weight Range:  Vital Signs:   Temp:  [89.3 F (31.8 C)-98.8 F (37.1 C)] 98.6 F (37 C) (05/22 0744) Pulse Rate:  [100-138] 104 (05/22 0600) Resp:  [11-33] 21 (05/22 0600) BP: (57-195)/(38-73) 101/64 mmHg (05/22 0400) SpO2:  [95 %-99 %] 98 % (05/22 0600) Arterial Line BP: (96-179)/(38-68) 111/43 mmHg (05/22 0600) FiO2 (%):  [40 %] 40 % (05/22 0400) Weight:  [64.7 kg (142 lb 10.2 oz)] 64.7 kg (142 lb 10.2 oz) (05/22 0500) Last BM Date:  (PTA)  Weight change: Filed Weights   03/11/15 0526 03/12/15 0500 03/13/15 0500  Weight: 62.7 kg (138 lb 3.7 oz) 62.7 kg (138 lb 3.7 oz) 64.7 kg (142 lb 10.2 oz)    Intake/Output:   Intake/Output Summary (Last 24 hours) at 03/13/15 0757 Last data filed at 03/13/15 0700  Gross per 24 hour  Intake 2306.96 ml  Output    985 ml  Net 1321.96 ml     Physical Exam: General: sedated on vent .  HEENT: +ETT Neck: thick . Carotids 2+ bilat; no bruits. No lymphadenopathy or thryomegaly appreciated.  Cor: PMI nonpalpable Regular rate & rhythm. 2/6 AS radiates to bilateral carotids.  Hickman catheter ok Lungs: mild rhonchi Abdomen: obese. soft, nontender, nondistended. No hepatosplenomegaly. No bruits or masses. Good bowel sounds.  Extremities: no cyanosis, + clubbing, macular-papular erythematous rash on LEs, trace edema  Neuro:moves all 4 without difficulty  Telemetry: Sinus tach   Labs: Basic Metabolic Panel:  Recent Labs Lab 03/18/2015 2330 03/10/15 0417  03/11/15 0439 03/11/15 1154 03/11/15 2250 03/12/15 0340 03/12/15 1105 03/12/15 2336 03/13/15 0500  NA 135 135  --  135  --   --  136  --   --  139  K 4.4 4.7  --  3.0*  --   --  3.2*  --   --  4.7  CL 87* 90*  --  92*  --   --  93*  --   --  98*  CO2  40* 32  --  33*  --   --  34*  --   --  33*  GLUCOSE 154* 113*  --  190*  --   --  200*  --   --  205*  BUN 27* 22*  --  19  --   --  18  --   --  22*  CREATININE 1.18* 1.01*  --  0.93  --   --  0.86  --   --  0.89  CALCIUM 8.6* 8.0*  --  8.4*  --   --  8.2*  --   --  8.8*  MG  --  1.5*  < > 1.5* 2.8* 3.0* 2.7* 2.4 2.4  --   PHOS  --  3.2  < > 3.0 2.9 3.0  --  3.1 3.6  --   < > = values in this interval not displayed.  Liver Function Tests: No results for input(s): AST, ALT, ALKPHOS, BILITOT, PROT, ALBUMIN in the last 168 hours. No results for input(s): LIPASE, AMYLASE in the last 168 hours. No results for input(s): AMMONIA in the last 168 hours.  CBC:  Recent Labs Lab 03/17/2015 2330 03/10/15 0417 03/11/15 0439 03/12/15  0340 03/13/15 0500  WBC 25.3* 25.7* 16.0* 19.1* 15.6*  NEUTROABS 22.0*  --  14.5* 16.9*  --   HGB 12.6 10.6* 10.7* 11.0* 10.1*  HCT 41.4 35.1* 33.5* 35.0* 32.9*  MCV 85.5 83.4 80.1 80.5 83.1  PLT 299 286 261 309 259    Cardiac Enzymes:  Recent Labs Lab 02/20/2015 2330 03/10/15 0417 03/10/15 0929 03/10/15 1500  TROPONINI 0.11* 0.10* 0.08* 0.08*    BNP: BNP (last 3 results)  Recent Labs  10/20/14 1400 02/21/2015 2330  BNP 224.2* 1442.0*    ProBNP (last 3 results) No results for input(s): PROBNP in the last 8760 hours.    Other results:  Imaging: Dg Chest Port 1 View  03/12/2015   CLINICAL DATA:  Acute respiratory failure with hypoxia and shortness of breath.  EXAM: PORTABLE CHEST - 1 VIEW  COMPARISON:  Yesterday.  FINDINGS: Endotracheal tube tip 1 cm above the carina. Right jugular catheter tip in the right atrium. A nasogastric tube extending into the stomach. Left subclavian catheter tip in the superior vena cava.  The cardiac silhouette remains mildly enlarged with stable post CABG changes. No significant change in right lung linear densities, diffuse prominence of the interstitial markings and patchy opacity in the left lung.  IMPRESSION: 1.  Stable pneumonia or pulmonary edema on the left. 2. Stable chronic interstitial lung disease with possible interstitial pulmonary edema. 3. Stable linear atelectasis or scarring at the right lung base.   Electronically Signed   By: Claudie Revering M.D.   On: 03/12/2015 09:48     Medications:     Scheduled Medications: . antiseptic oral rinse  7 mL Mouth Rinse q12n4p  . chlorhexidine  15 mL Mouth Rinse BID  . clopidogrel  75 mg Oral Daily  . famotidine  20 mg Per Tube Daily  . furosemide  40 mg Intravenous Daily  . heparin subcutaneous  5,000 Units Subcutaneous 3 times per day  . ice pack  1 each Other Q6H  . insulin aspart  0-15 Units Subcutaneous 6 times per day  . levofloxacin (LEVAQUIN) IV  500 mg Intravenous Q24H  . levothyroxine  50 mcg Oral QAC breakfast  . potassium chloride  20 mEq Per Tube Daily    Infusions: . dexmedetomidine 0.6 mcg/kg/hr (03/13/15 0450)  . epoprostenol 32.57 ng/kg/min (03/12/15 1945)  . feeding supplement (VITAL AF 1.2 CAL) 1,000 mL (03/12/15 1220)  . fentaNYL infusion INTRAVENOUS 100 mcg/hr (03/13/15 0052)    PRN Medications: sodium chloride, albuterol, diphenhydrAMINE, hydrALAZINE, ondansetron (ZOFRAN) IV   Assessment:   1. Acute on chronic respiratory failure - likely mixed picture 2. Acute on chronic diastolic HF 3. Pulmonary HTN on Flolan 4. CAD s/p CABG 5. HCAP 6. Hypomagensemia/hypokalemia 7. Hypertension   Plan/Discussion:    Main issue seems to be severe HCAP. Now improving. Hopefully will extubate soon. Agree with broad spectrum abx.  CVP improved. Continue daily lasix. Co-ox good.  Continue Flolan. PAP pressures look good on echo.   BP improved with sedation and PRN hydralazine.    Length of Stay: 3 Namrata Dangler MD 03/13/2015, 7:57 AM  Advanced Heart Failure Team Pager [phone] (M-F; [place])  Please contact [facility] for night-coverage after hours (4p -7a ) and weekends on [email or web address]

## 2015-03-13 NOTE — Progress Notes (Signed)
Attempted wean, rr increased into the high 40's  Place back on full support within in 8 minutes

## 2015-03-13 NOTE — Progress Notes (Signed)
PULMONARY / CRITICAL CARE MEDICINE   Name: Meagan [name] MRN: [id number] DOB: 1946-08-24    ADMISSION DATE:  02/22/2015 CONSULTATION DATE:  03/13/2015  REFERRING MD :  EDP at [place]:  SOB  INITIAL PRESENTATION:  69 y.o. F with severe PH (on Flolan) brought to [facility] 5/19 with SOB.  Found to have HCAP and AoC hypercarbic respiratory failure.  Started on BiPAP and transferred to [facility] for further evaluation and management.     STUDIES:  12/2014 RHC RA = 16 RV = 85/5/18 PA = 80/20 (45) PCW = 22 Fick cardiac output/index = 4.3/2.6 PVR = 5.6 WU Ao sat = 89% PA sat = 45%, 47%  CXR 5/19 >>> low lung volumes, cardiomegaly, pulmonary edema. ECHO 5/20 >> LVEF 60-65%, no WMA, mild RV and RA dilation, RVSP 65mm Hg  SIGNIFICANT EVENTS: 5/19  Admitted to [facility] with HCAP and AoC hypercarbic respiratory failure 5/21  Weaned for ~ 2 hours, anxious/hypertensive.  Rash with zosyn  5/22  Failed wean due to anxiety / tachypnea, lasted approx 8 minutes    SUBJECTIVE:  RT reports patient failed SBT - lasted approx 8 minutes on wean then developed tachypnea, agitation. Remains on flolan, precedex, fentanyl gtt.  Rash improved.    VITAL SIGNS: Temp:  [94.1 F (34.5 C)-98.8 F (37.1 C)] 98.6 F (37 C) (05/22 0744) Pulse Rate:  [100-138] 105 (05/22 0900) Resp:  [15-33] 17 (05/22 0900) BP: (57-146)/(38-65) 112/46 mmHg (05/22 0800) SpO2:  [95 %-99 %] 97 % (05/22 0900) Arterial Line BP: (96-167)/(38-64) 124/49 mmHg (05/22 0900) FiO2 (%):  [40 %] 40 % (05/22 0900) Weight:  [142 lb 10.2 oz (64.7 kg)] 142 lb 10.2 oz (64.7 kg) (05/22 0500)   HEMODYNAMICS: CVP:  [5 mmHg-22 mmHg] 7 mmHg   VENTILATOR SETTINGS: Vent Mode:  [-] PRVC FiO2 (%):  [40 %] 40 % Set Rate:  [20 bmp] 20 bmp Vt Set:  [360 mL] 360 mL PEEP:  [5 cmH20] 5 cmH20 Pressure Support:  [20 cmH20] 20 cmH20 Plateau Pressure:  [18 cmH20-30 cmH20] 30 cmH20   INTAKE / OUTPUT: Intake/Output      05/21 0701 - 05/22 0700 05/22 0701 -  05/23 0700   I.V. (mL/kg) 867 (13.4) 133.1 (2.1)   NG/GT 1290 180   IV Piggyback 150    Total Intake(mL/kg) 2307 (35.7) 313.1 (4.8)   Urine (mL/kg/hr) 985 (0.6) 160 (0.6)   Emesis/NG output     Total Output 985 160   Net +1322 +153.1          PHYSICAL EXAMINATION: Gen: chronically ill appearing, anxious on vent HENT: NCAT in place, EOMi PULM: even/non-labored, lungs bilaterally with rhonchi CV: RRR, 2/6 SEM, radiates to carotids  GI: BS+, soft, nontender Derm: mild bruising, no rash Psyche: anxious  Neuro: sedate, arouses, follows commands  LABS:  CBC  Recent Labs Lab 03/11/15 0439 03/12/15 0340 03/13/15 0500  WBC 16.0* 19.1* 15.6*  HGB 10.7* 11.0* 10.1*  HCT 33.5* 35.0* 32.9*  PLT 261 309 259   Coag's No results for input(s): APTT, INR in the last 168 hours.   BMET  Recent Labs Lab 03/11/15 0439 03/12/15 0340 03/13/15 0500  NA 135 136 139  K 3.0* 3.2* 4.7  CL 92* 93* 98*  CO2 33* 34* 33*  BUN 19 18 22*  CREATININE 0.93 0.86 0.89  GLUCOSE 190* 200* 205*   Electrolytes  Recent Labs Lab 03/11/15 0439  03/11/15 2250 03/12/15 0340 03/12/15 1105 03/12/15 2336  03/13/15 0500  CALCIUM 8.4*  --   --  8.2*  --   --  8.8*  MG 1.5*  < > 3.0* 2.7* 2.4 2.4  --   PHOS 3.0  < > 3.0  --  3.1 3.6  --   < > = values in this interval not displayed.   Sepsis Markers  Recent Labs Lab 03/10/15 0044 03/10/15 0455  03/10/15 0908 03/10/15 1000 03/11/15 0439 03/12/15 0340  LATICACIDVEN 0.87 1.2  --   --  0.5  --   --   PROCALCITON  --   --   < > 0.21  --  0.21 0.16  < > = values in this interval not displayed. ABG  Recent Labs Lab 03/01/2015 2333 03/10/15 0055 03/10/15 1019  PHART 7.345* 7.376 7.386  PCO2ART 72.2* 62.7* 60.6*  PO2ART 62.5* 60* 198.0*   Liver Enzymes No results for input(s): AST, ALT, ALKPHOS, BILITOT, ALBUMIN in the last 168 hours.   Cardiac Enzymes  Recent Labs Lab 03/10/15 0417 03/10/15 0929 03/10/15 1500  TROPONINI 0.10*  0.08* 0.08*   Glucose  Recent Labs Lab 03/12/15 1228 03/12/15 1600 03/12/15 1953 03/13/15 0014 03/13/15 0418 03/13/15 0747  GLUCAP 214* 258* 179* 171* 171* 198*    Imaging 5/22 CXR > images reviewed, diffuse bilateral opacities, improved on L   ASSESSMENT / PLAN:  PULMONARY A: Acute on chronic hypercarbic and hypoxemic respiratory failure - due to pulm edema vs HCAP,  improving Severe PAH - on Flolan (followed by Dr. Gilles Chiquito at [facility]) Restrictive lung disease P:   MV support, 8cc/kg Daily SBT and WUA Continue diuresis as renal function / BP permit  Tren CXR  See ID Continue Flolan per pharmacy   CARDIOVASCULAR A:  Shock - suspect septic more than cardiogenic  Co-ox 82% 5/21.  Resolved.  Hypertension - in setting of anxiety  Chronic dCHF - echo 5/19 with minimal RVSP, minimal RV dilation Troponin leak - demand ischemia vs NSTEMI Hx CAD s/p CABG P:  Lasix 40 mg QD, additional dose 5/22 of 20 mg   Continue flolan, dose adjustment per Dr. Haroldine Laws Hydralazine to control BP   RENAL A:   CKD Hypomagnesemia  Hypokalemia P:   Replace electrolytes as indicated Lasix as above Monitor BMET and UOP  GASTROINTESTINAL A:   GERD Nutrition P:   SUP:  Pepcid  Continue tube feedings  HEMATOLOGIC A:   Anemia - chronic, stable VTE Prophylaxis P:  Transfuse for Hgb < 7 SCD's / Heparin CBC in AM  INFECTIOUS A:   HCAP - LLL Drug Related Rash - with zosyn administration  P:   BCx2 5/19 >> Sputum Cx 5/19 >> neg UC 5/19 >> neg   Vanc, start date 5/19, day 4/x. Zosyn, start date 5/19, day 2/2 - d/c'd due to rash 5/21. Levaquin, start date 5/19, day 2/x   PCT algorithm to limit abx exposure, 5/21 0.16 PRN benadryl for itching / rash   ENDOCRINE A:   DM Hypothyroidism   P:   CBG's q4hr SSI Continue levothyroxine per tube  NEUROLOGIC A:   Acute hypercarbic encephalopathy - resolved 5/21 Chronic pain Vent synchrony needs P:   RASS goal 0 to  -1 Fentanyl gtt + PRN Hold outpatient xanax, dilaudid, cymbalta  Can not crush cymbalta   Family updated:  No family available at time of rounds.     Interdisciplinary Family Meeting v Palliative Care Meeting:  Due by: 5/25.   Noe Gens, NP-C [facility] Pgr: [phone] or [phone]  PCCM ATTENDING: I have reviewed pt's initial presentation, consultants notes and hospital database in detail.  The above assessment and plan was formulated under my direction.  In summary: Severe PAH on chronic epoprostenol Pulm edema, diastolic HF - little improvement with diuresis Possible PNA Chronic debilitation Agitation and discomfort improved on dexmedetomidine initiated 5/21 Cont vent support and current medical therapies Need to start thinking about limitations/goals of care Husband not @ bedside this AM. He will need a complete update 5/23. Dr Sung Amabile has been very involved in pt's care for a long time and would likely have important insights to share during goals of care discussion  Merton Border, MD;  PCCM service; Mobile [phone]

## 2015-03-14 ENCOUNTER — Inpatient Hospital Stay (HOSPITAL_COMMUNITY): Payer: Medicare Other

## 2015-03-14 DIAGNOSIS — J9602 Acute respiratory failure with hypercapnia: Secondary | ICD-10-CM | POA: Insufficient documentation

## 2015-03-14 LAB — GLUCOSE, CAPILLARY
GLUCOSE-CAPILLARY: 185 mg/dL — AB (ref 65–99)
Glucose-Capillary: 157 mg/dL — ABNORMAL HIGH (ref 65–99)
Glucose-Capillary: 163 mg/dL — ABNORMAL HIGH (ref 65–99)
Glucose-Capillary: 169 mg/dL — ABNORMAL HIGH (ref 65–99)
Glucose-Capillary: 180 mg/dL — ABNORMAL HIGH (ref 65–99)
Glucose-Capillary: 247 mg/dL — ABNORMAL HIGH (ref 65–99)

## 2015-03-14 LAB — BASIC METABOLIC PANEL
ANION GAP: 10 (ref 5–15)
BUN: 25 mg/dL — AB (ref 6–20)
CALCIUM: 8.8 mg/dL — AB (ref 8.9–10.3)
CO2: 33 mmol/L — ABNORMAL HIGH (ref 22–32)
Chloride: 96 mmol/L — ABNORMAL LOW (ref 101–111)
Creatinine, Ser: 0.91 mg/dL (ref 0.44–1.00)
Glucose, Bld: 203 mg/dL — ABNORMAL HIGH (ref 65–99)
Potassium: 4.4 mmol/L (ref 3.5–5.1)
SODIUM: 139 mmol/L (ref 135–145)

## 2015-03-14 LAB — CARBOXYHEMOGLOBIN
CARBOXYHEMOGLOBIN: 1.1 % (ref 0.5–1.5)
Methemoglobin: 0.6 % (ref 0.0–1.5)
O2 Saturation: 78.4 %
Total hemoglobin: 15.9 g/dL (ref 12.0–16.0)

## 2015-03-14 LAB — CBC
HCT: 35.6 % — ABNORMAL LOW (ref 36.0–46.0)
Hemoglobin: 11 g/dL — ABNORMAL LOW (ref 12.0–15.0)
MCH: 25.6 pg — ABNORMAL LOW (ref 26.0–34.0)
MCHC: 30.9 g/dL (ref 30.0–36.0)
MCV: 83 fL (ref 78.0–100.0)
PLATELETS: 311 10*3/uL (ref 150–400)
RBC: 4.29 MIL/uL (ref 3.87–5.11)
RDW: 17.6 % — AB (ref 11.5–15.5)
WBC: 15.9 10*3/uL — ABNORMAL HIGH (ref 4.0–10.5)

## 2015-03-14 MED ORDER — FENTANYL CITRATE (PF) 100 MCG/2ML IJ SOLN: 50.0000 ug | INTRAMUSCULAR | Status: DC | PRN

## 2015-03-14 MED ORDER — METHYLPREDNISOLONE SODIUM SUCC 40 MG IJ SOLR
40.0000 mg | Freq: Two times a day (BID) | INTRAMUSCULAR | Status: AC
  Administered 2015-03-14 (×2): 40 mg via INTRAVENOUS
  Filled 2015-03-14 (×2): qty 1

## 2015-03-14 MED ORDER — DEXMEDETOMIDINE HCL IN NACL 200 MCG/50ML IV SOLN
0.4000 ug/kg/h | INTRAVENOUS | Status: DC
Start: 2015-03-14 — End: 2015-03-14
  Administered 2015-03-14: 0.8 ug/kg/h via INTRAVENOUS
  Administered 2015-03-14: 0.4 ug/kg/h via INTRAVENOUS
  Administered 2015-03-14: 1 ug/kg/h via INTRAVENOUS
  Administered 2015-03-14: 0.8 ug/kg/h via INTRAVENOUS
  Filled 2015-03-14 (×4): qty 50

## 2015-03-14 MED ORDER — FUROSEMIDE 10 MG/ML IJ SOLN
40.0000 mg | Freq: Three times a day (TID) | INTRAMUSCULAR | Status: DC
  Administered 2015-03-14 – 2015-03-15 (×3): 40 mg via INTRAVENOUS
  Filled 2015-03-14 (×5): qty 4

## 2015-03-14 MED ORDER — MIDAZOLAM HCL 2 MG/2ML IJ SOLN
1.0000 mg | INTRAMUSCULAR | Status: DC | PRN
  Administered 2015-03-14 (×2): 1 mg via INTRAVENOUS
  Filled 2015-03-14 (×2): qty 2

## 2015-03-14 MED ORDER — WHITE PETROLATUM GEL
Status: AC
Start: 2015-03-14 — End: 2015-03-14
  Administered 2015-03-14: 0.2
  Filled 2015-03-14: qty 1

## 2015-03-14 NOTE — Progress Notes (Signed)
Per MD order- RT pulled ETT back to 22 cm lip- originally 23cm lip- uneventful.

## 2015-03-14 NOTE — Progress Notes (Signed)
PULMONARY / CRITICAL CARE MEDICINE   Name: Meagan [name] MRN: [id number] DOB: [birth year]    ADMISSION DATE:  03/04/2015 CONSULTATION DATE:  03/14/2015  REFERRING MD :  EDP at [place]:  SOB  INITIAL PRESENTATION:  69 y.o. F with severe PH (on Flolan) brought to [facility] ED 5/19 with SOB.  Found to have HCAP and AoC hypercarbic respiratory failure.  Started on BiPAP and transferred to [facility] for further evaluation and management.    STUDIES:  12/2014 RHC RA = 16 RV = 85/5/18 PA = 80/20 (45) PCW = 22 Fick cardiac output/index = 4.3/2.6 PVR = 5.6 WU Ao sat = 89% PA sat = 45%, 47%  CXR 5/19 >>> low lung volumes, cardiomegaly, pulmonary edema. ECHO 5/20 >> LVEF 60-65%, no WMA, mild RV and RA dilation, RVSP 37mm Hg  SIGNIFICANT EVENTS: 5/19 - admitted to [facility] with HCAP and AoC hypercarbic respiratory failure  SUBJECTIVE: weaning poor with high RR  VITAL SIGNS: Temp:  [97.7 F (36.5 C)-98.4 F (36.9 C)] 98.4 F (36.9 C) (05/23 1136) Pulse Rate:  [92-140] 113 (05/23 1100) Resp:  [9-30] 28 (05/23 1100) BP: (112-129)/(52-66) 118/52 mmHg (05/23 0800) SpO2:  [94 %-100 %] 96 % (05/23 1100) Arterial Line BP: (105-184)/(43-69) 126/54 mmHg (05/23 1100) FiO2 (%):  [40 %] 40 % (05/23 1153) Weight:  [66 kg (145 lb 8.1 oz)] 66 kg (145 lb 8.1 oz) (05/23 0500)   HEMODYNAMICS: CVP:  [8 mmHg-14 mmHg] 14 mmHg   VENTILATOR SETTINGS: Vent Mode:  [-] CPAP;PSV FiO2 (%):  [40 %] 40 % Set Rate:  [20 bmp] 20 bmp Vt Set:  [360 mL] 360 mL PEEP:  [5 cmH20] 5 cmH20 Pressure Support:  [10 cmH20-16 cmH20] 16 cmH20 Plateau Pressure:  [25 cmH20-30 cmH20] 29 cmH20   INTAKE / OUTPUT: Intake/Output      05/22 0701 - 05/23 0700 05/23 0701 - 05/24 0700   I.V. (mL/kg) 742.1 (11.2)    NG/GT 1250 120   IV Piggyback 100    Total Intake(mL/kg) 2092.1 (31.7) 120 (1.8)   Urine (mL/kg/hr) 2085 (1.3) 180 (0.5)   Stool  0 (0)   Total Output 2085 180   Net +7.1 -60        Stool Occurrence  1 x     PHYSICAL  EXAMINATION: Gen: chronically ill appearing, anxious on vent HENT: jvd noted PULM: cta, reduced CV: RRR, 2/6 SEM, radiates to carotids  GI: BS+, soft, nontender Derm: mild bruising, rash erythemia upper chest Psyche: anxious, but nodding head to questions, rass 1 Neuro: following commands   LABS:  CBC  Recent Labs Lab 03/12/15 0340 03/13/15 0500 03/14/15 0232  WBC 19.1* 15.6* 15.9*  HGB 11.0* 10.1* 11.0*  HCT 35.0* 32.9* 35.6*  PLT 309 259 311   Coag's No results for input(s): APTT, INR in the last 168 hours.   BMET  Recent Labs Lab 03/12/15 0340 03/13/15 0500 03/14/15 0232  NA 136 139 139  K 3.2* 4.7 4.4  CL 93* 98* 96*  CO2 34* 33* 33*  BUN 18 22* 25*  CREATININE 0.86 0.89 0.91  GLUCOSE 200* 205* 203*   Electrolytes  Recent Labs Lab 03/11/15 2250 03/12/15 0340 03/12/15 1105 03/12/15 2336 03/13/15 0500 03/14/15 0232  CALCIUM  --  8.2*  --   --  8.8* 8.8*  MG 3.0* 2.7* 2.4 2.4  --   --   PHOS 3.0  --  3.1 3.6  --   --  Sepsis Markers  Recent Labs Lab 03/10/15 0044 03/10/15 0455  03/10/15 0908 03/10/15 1000 03/11/15 0439 03/12/15 0340  LATICACIDVEN 0.87 1.2  --   --  0.5  --   --   PROCALCITON  --   --   < > 0.21  --  0.21 0.16  < > = values in this interval not displayed. ABG  Recent Labs Lab 03/19/2015 2333 03/10/15 0055 03/10/15 1019  PHART 7.345* 7.376 7.386  PCO2ART 72.2* 62.7* 60.6*  PO2ART 62.5* 60* 198.0*   Liver Enzymes No results for input(s): AST, ALT, ALKPHOS, BILITOT, ALBUMIN in the last 168 hours.   Cardiac Enzymes  Recent Labs Lab 03/10/15 0417 03/10/15 0929 03/10/15 1500  TROPONINI 0.10* 0.08* 0.08*   Glucose  Recent Labs Lab 03/13/15 1215 03/13/15 1552 03/13/15 1949 03/14/15 0002 03/14/15 0439 03/14/15 0759  GLUCAP 181* 210* 186* 157* 185* 169*    Imaging 5/21 CXR > images reviewed, diffuse bilateral opacities L>R, stable lines  ASSESSMENT / PLAN:  PULMONARY A: Acute on chronic  hypercarbic and hypoxemic respiratory failure - due to pulm edema vs HCAP,  improving Severe PAH - on Flolan (followed by Dr. Gilles Chiquito at [facility]) Restrictive lung disease P:   MV support, 8cc/kg Daily SBT and WUA, PS higher required, 10/5, unable to extubate Will need to discuss reintubation wishes Continue diuresis Retract ett 1 cm Tren CXR in am for LLL See ID Continue Flolan, never stop  CARDIOVASCULAR A:  Shock - suspect septic more than cardiogenic  Co-ox 82% 5/21.  Resolved.  Hypertension - in setting of anxiety  Chronic dCHF - echo 5/19 with minimal RVSP, minimal RV dilation Troponin leak - demand ischemia vs NSTEMI Hx CAD s/p CABG P:  Lasix 40 mg QD  Continue flolan at dose Hydralazine to control BP  Lasix, see renal  RENAL A:   CKD Hypomagnesemia  P:   bmet in am  Even on daily lasix Lasix to 40 tid  GASTROINTESTINAL A:   GERD Nutrition P:   SUP:  Pepcid  Continue tube feedings  HEMATOLOGIC A:   Anemia - chronic, stable VTE Prophylaxis P:  Transfuse for Hgb < 7 SCD's / Heparin CBC in AM with lasix  INFECTIOUS A:   HCAP - LLL Drug Related Rash - with zosyn administration  P:   BCx2 5/19 >> Sputum Cx 5/19 >> UC 5/19 >> neg   Vanc, start date 5/19>>>5/20 Zosyn, start date 5/19, day 2/2 - d/c'd due to rash 5/21. Levaquin, start date 5/19>>>  PCT algorithm to limit abx exposure Zosyn allergy recorded May nee steroids for rash   ENDOCRINE A:   DM Hypothyroidism   P:   CBG's q4hr SSI Continue levothyroxine, change to IV form at 1/2 home dose  NEUROLOGIC A:   Acute hypercarbic encephalopathy - resolved 5/21 Chronic pain Vent synchrony needs P:   RASS goal 0 to -1 Fentanyl gtt Dc precedex in setting flolane and hemodynamic concerns  Hold outpatient xanax, duloxetine, dilaudid   Family updated:  No family available at time of rounds.  Attempted call but no response.    Interdisciplinary Family Meeting v Palliative Care Meeting:   Due by: 5/25.   Ccm time 30 min   Lavon Paganini. Titus Mould, MD, [place] Pgr: [facility]

## 2015-03-14 NOTE — Care Management Note (Signed)
Case Management Note  Patient Details  Name: [name] MRN: [id number] Date of Birth: Aug 24, 1946  Subjective/Objective:                  Patient lives at home with husband.  Sister Sunday Spillers in room with her, husband is home resting after being with patient all night.  Patient is on flolan at home.   03-14-15 Continues on vent - failing weaning due to anxiety.  CM will continue to follow for discharge needs.   Action/Plan:   Expected Discharge Date:                  Expected Discharge Plan:  [place]  In-House Referral:     Discharge planning Services     Post Acute Care Choice:    Choice offered to:     DME Arranged:    DME Agency:     HH Arranged:    [place] Agency:     Status of Service:  In process, will continue to follow  Medicare Important Message Given:    Date Medicare IM Given:    Medicare IM give by:    Date Additional Medicare IM Given:    Additional Medicare Important Message give by:     If discussed at [place] of Stay Meetings, dates discussed:    Additional Comments:  Vergie Living, RN 03/14/2015, 9:23 AM

## 2015-03-14 NOTE — Progress Notes (Signed)
Advanced Heart Failure Rounding Note   Subjective:    Failed vent wean again. Very anxious.    Objective:   Weight Range:  Vital Signs:   Temp:  [97.9 F (36.6 C)-98.4 F (36.9 C)] 98.4 F (36.9 C) (05/23 1136) Pulse Rate:  [97-140] 113 (05/23 1100) Resp:  [9-30] 28 (05/23 1100) BP: (112-129)/(52-66) 118/52 mmHg (05/23 0800) SpO2:  [94 %-100 %] 96 % (05/23 1100) Arterial Line BP: (105-184)/(43-69) 126/54 mmHg (05/23 1100) FiO2 (%):  [40 %] 40 % (05/23 1153) Weight:  [66 kg (145 lb 8.1 oz)] 66 kg (145 lb 8.1 oz) (05/23 0500) Last BM Date: 03/13/15  Weight change: Filed Weights   03/12/15 0500 03/13/15 0500 03/14/15 0500  Weight: 62.7 kg (138 lb 3.7 oz) 64.7 kg (142 lb 10.2 oz) 66 kg (145 lb 8.1 oz)    Intake/Output:   Intake/Output Summary (Last 24 hours) at 03/14/15 1259 Last data filed at 03/14/15 1100  Gross per 24 hour  Intake 2299.13 ml  Output   1780 ml  Net 519.13 ml     Physical Exam: General: awake on vent .  HEENT: +ETT Neck: thick . Carotids 2+ bilat; no bruits. No lymphadenopathy or thryomegaly appreciated.  Cor: PMI nonpalpable Regular rate & rhythm. 2/6 AS radiates to bilateral carotids.  Hickman catheter ok Lungs: mild rhonchi Abdomen: obese. soft, nontender, nondistended. No hepatosplenomegaly. No bruits or masses. Good bowel sounds.  Extremities: no cyanosis, + clubbing, macular-papular erythematous rash on LEs, trace edema  Neuro:moves all 4 without difficulty  Telemetry: Sinus tach   Labs: Basic Metabolic Panel:  Recent Labs Lab 03/10/15 0417  03/11/15 0439 03/11/15 1154 03/11/15 2250 03/12/15 0340 03/12/15 1105 03/12/15 2336 03/13/15 0500 03/14/15 0232  NA 135  --  135  --   --  136  --   --  139 139  K 4.7  --  3.0*  --   --  3.2*  --   --  4.7 4.4  CL 90*  --  92*  --   --  93*  --   --  98* 96*  CO2 32  --  33*  --   --  34*  --   --  33* 33*  GLUCOSE 113*  --  190*  --   --  200*  --   --  205* 203*  BUN 22*  --  19   --   --  18  --   --  22* 25*  CREATININE 1.01*  --  0.93  --   --  0.86  --   --  0.89 0.91  CALCIUM 8.0*  --  8.4*  --   --  8.2*  --   --  8.8* 8.8*  MG 1.5*  < > 1.5* 2.8* 3.0* 2.7* 2.4 2.4  --   --   PHOS 3.2  < > 3.0 2.9 3.0  --  3.1 3.6  --   --   < > = values in this interval not displayed.  Liver Function Tests: No results for input(s): AST, ALT, ALKPHOS, BILITOT, PROT, ALBUMIN in the last 168 hours. No results for input(s): LIPASE, AMYLASE in the last 168 hours. No results for input(s): AMMONIA in the last 168 hours.  CBC:  Recent Labs Lab 03/08/2015 2330 03/10/15 0417 03/11/15 0439 03/12/15 0340 03/13/15 0500 03/14/15 0232  WBC 25.3* 25.7* 16.0* 19.1* 15.6* 15.9*  NEUTROABS 22.0*  --  14.5* 16.9*  --   --  HGB 12.6 10.6* 10.7* 11.0* 10.1* 11.0*  HCT 41.4 35.1* 33.5* 35.0* 32.9* 35.6*  MCV 85.5 83.4 80.1 80.5 83.1 83.0  PLT 299 286 261 309 259 311    Cardiac Enzymes:  Recent Labs Lab 03/10/2015 2330 03/10/15 0417 03/10/15 0929 03/10/15 1500  TROPONINI 0.11* 0.10* 0.08* 0.08*    BNP: BNP (last 3 results)  Recent Labs  10/20/14 1400 02/22/2015 2330  BNP 224.2* 1442.0*    ProBNP (last 3 results) No results for input(s): PROBNP in the last 8760 hours.    Other results:  Imaging: Dg Chest Port 1 View  03/14/2015   CLINICAL DATA:  Acute respiratory failure.  EXAM: PORTABLE CHEST - 1 VIEW  COMPARISON:  03/13/2015, 07/29/2014, 02/23/2014, 07/22/2012.  FINDINGS: Endotracheal tube, NG tube, left subclavian line, right IJ line in stable position. Prior CABG. Persistent bilateral pulmonary interstitial prominence consistent with pulmonary interstitial edema, most likely superimposed on chronic interstitial lung disease present. No prominent pleural effusion. No pneumothorax.  IMPRESSION: 1.  Lines and tubes in stable position.  2. Prior CABG. Persistent cardiomegaly with diffuse bilateral from interstitial prominence most consistent with pulmonary interstitial  edema superimposed on chronic interstitial lung disease. Active pneumonitis cannot be excluded .   Electronically Signed   By: Marcello Moores  Register   On: 03/14/2015 07:24   Dg Chest Port 1 View  03/13/2015   CLINICAL DATA:  Ventilator dependent respiratory failure. Followup pulmonary edema and left basilar atelectasis versus pneumonia superimposed upon chronic interstitial lung disease.  EXAM: PORTABLE CHEST - 1 VIEW  COMPARISON:  Portable chest x-rays yesterday dating back to 02/26/2015. Two-view chest x-ray 01/21/2015.  FINDINGS: Endotracheal tube tip projects approximately 2 cm above the carina. Right jugular central venous catheter tip projects at or near the cavoatrial junction. Left subclavian central venous catheter tip projects over the upper SVC. Nasogastric tube courses below the diaphragm into the stomach.  Prior sternotomy for CABG. Cardiac silhouette mildly enlarged for AP portable technique, unchanged. Markedly suboptimal inspiration. No significant interval change over the past 4 days in the airspace consolidation in the left lower lobe with silhouetting of the left hemidiaphragm. The previously identified interstitial pulmonary edema has nearly completely resolved. No new pulmonary parenchymal abnormalities.  IMPRESSION: 1. Support apparatus satisfactory. 2. Stable left lower lobe atelectasis versus pneumonia superimposed upon chronic interstitial lung disease. 3. Near complete resolution of pulmonary edema. 4. No new abnormalities.   Electronically Signed   By: Evangeline Dakin M.D.   On: 03/13/2015 09:03     Medications:     Scheduled Medications: . antiseptic oral rinse  7 mL Mouth Rinse q12n4p  . chlorhexidine  15 mL Mouth Rinse BID  . clopidogrel  75 mg Oral Daily  . famotidine  20 mg Per Tube Daily  . furosemide  40 mg Intravenous TID  . heparin subcutaneous  5,000 Units Subcutaneous 3 times per day  . ice pack  1 each Other Q6H  . insulin aspart  0-15 Units Subcutaneous 6 times  per day  . levofloxacin (LEVAQUIN) IV  500 mg Intravenous Q24H  . levothyroxine  25 mcg Oral QAC breakfast  . methylPREDNISolone (SOLU-MEDROL) injection  40 mg Intravenous Q12H  . potassium chloride  20 mEq Per Tube Daily    Infusions: . epoprostenol 32.57 ng/kg/min (03/13/15 2000)  . feeding supplement (VITAL AF 1.2 CAL) 1,000 mL (03/14/15 1010)  . fentaNYL infusion INTRAVENOUS Stopped (03/14/15 0917)    PRN Medications: sodium chloride, albuterol, diphenhydrAMINE, fentaNYL (SUBLIMAZE) injection, hydrALAZINE, midazolam,  ondansetron (ZOFRAN) IV   Assessment:   1. Acute on chronic respiratory failure - likely mixed picture 2. Acute on chronic diastolic HF 3. Pulmonary HTN on Flolan 4. CAD s/p CABG 5. HCAP 6. Hypomagensemia/hypokalemia 7. Hypertension   Plan/Discussion:    Continues to struggle with vent wean. I suspect this is related to a combination of her HCAP, severe lung disease and anxiety which drives up her respiratory rte and BP dramatically. I discussed the situation at length with Dr. Titus Mould, Ms. Cullars and multiple family members.   My suspicion is that she will fly if we can dry her out a bit more. Agree with increased diuretics today and trial of extubation tomorrow.  We discussed options if she fails extubation. She tells me she would not want tracheostomy. So plan will be trial of extubation with Bipap support if needed. If develops recurrent respiratory failure would not re-intubate and would proceed with comfort care. She is no re-intubation.   The patient is critically ill with multiple organ systems failure and requires high complexity decision making for assessment and support, frequent evaluation and titration of therapies, application of advanced monitoring technologies and extensive interpretation of multiple databases.   Critical Care Time devoted to patient care services described in this note is 35 Minutes.    Length of Stay: 4 Bensimhon,  Daniel MD 03/14/2015, 12:59 PM  Advanced Heart Failure Team Pager [phone] (M-F; [place])  Please contact [facility] for night-coverage after hours (4p -7a ) and weekends on [email or web address]

## 2015-03-15 ENCOUNTER — Inpatient Hospital Stay (HOSPITAL_COMMUNITY): Payer: Medicare Other

## 2015-03-15 DIAGNOSIS — J9602 Acute respiratory failure with hypercapnia: Secondary | ICD-10-CM

## 2015-03-15 LAB — COMPREHENSIVE METABOLIC PANEL
ALBUMIN: 2.5 g/dL — AB (ref 3.5–5.0)
ALK PHOS: 109 U/L (ref 38–126)
ALT: 25 U/L (ref 14–54)
AST: 34 U/L (ref 15–41)
Anion gap: 12 (ref 5–15)
BUN: 40 mg/dL — ABNORMAL HIGH (ref 6–20)
CO2: 31 mmol/L (ref 22–32)
Calcium: 9.2 mg/dL (ref 8.9–10.3)
Chloride: 96 mmol/L — ABNORMAL LOW (ref 101–111)
Creatinine, Ser: 1.08 mg/dL — ABNORMAL HIGH (ref 0.44–1.00)
GFR calc Af Amer: 59 mL/min — ABNORMAL LOW (ref >60)
GFR, EST NON AFRICAN AMERICAN: 51 mL/min — AB (ref >60)
Glucose, Bld: 305 mg/dL — ABNORMAL HIGH (ref 65–99)
Potassium: 4.5 mmol/L (ref 3.5–5.1)
SODIUM: 139 mmol/L (ref 135–145)
Total Bilirubin: 0.6 mg/dL (ref 0.3–1.2)
Total Protein: 6.2 g/dL — ABNORMAL LOW (ref 6.5–8.1)

## 2015-03-15 LAB — CBC WITH DIFFERENTIAL/PLATELET
Basophils Absolute: 0 10*3/uL (ref 0.0–0.1)
Basophils Relative: 0 % (ref 0–1)
Eosinophils Absolute: 0 10*3/uL (ref 0.0–0.7)
Eosinophils Relative: 0 % (ref 0–5)
HCT: 36.7 % (ref 36.0–46.0)
Hemoglobin: 11.4 g/dL — ABNORMAL LOW (ref 12.0–15.0)
Lymphocytes Relative: 4 % — ABNORMAL LOW (ref 12–46)
Lymphs Abs: 0.5 10*3/uL — ABNORMAL LOW (ref 0.7–4.0)
MCH: 25.1 pg — ABNORMAL LOW (ref 26.0–34.0)
MCHC: 31.1 g/dL (ref 30.0–36.0)
MCV: 80.8 fL (ref 78.0–100.0)
MONO ABS: 0.3 10*3/uL (ref 0.1–1.0)
MONOS PCT: 2 % — AB (ref 3–12)
NEUTROS ABS: 12.6 10*3/uL — AB (ref 1.7–7.7)
Neutrophils Relative %: 94 % — ABNORMAL HIGH (ref 43–77)
PLATELETS: 348 10*3/uL (ref 150–400)
RBC: 4.54 MIL/uL (ref 3.87–5.11)
RDW: 17.9 % — ABNORMAL HIGH (ref 11.5–15.5)
WBC: 13.4 10*3/uL — AB (ref 4.0–10.5)

## 2015-03-15 LAB — GLUCOSE, CAPILLARY
GLUCOSE-CAPILLARY: 251 mg/dL — AB (ref 65–99)
GLUCOSE-CAPILLARY: 225 mg/dL — AB (ref 65–99)
GLUCOSE-CAPILLARY: 253 mg/dL — AB (ref 65–99)
Glucose-Capillary: 133 mg/dL — ABNORMAL HIGH (ref 65–99)
Glucose-Capillary: 253 mg/dL — ABNORMAL HIGH (ref 65–99)
Glucose-Capillary: 279 mg/dL — ABNORMAL HIGH (ref 65–99)

## 2015-03-15 LAB — TROPONIN I
Troponin I: 0.88 ng/mL (ref <0.031)
Troponin I: 1.53 ng/mL (ref <0.031)
Troponin I: 2.71 ng/mL (ref <0.031)

## 2015-03-15 LAB — CULTURE, BLOOD (ROUTINE X 2)
CULTURE: NO GROWTH
Culture: NO GROWTH

## 2015-03-15 LAB — CARBOXYHEMOGLOBIN
CARBOXYHEMOGLOBIN: 1.6 % — AB (ref 0.5–1.5)
Methemoglobin: 1 % (ref 0.0–1.5)
O2 Saturation: 81.6 %
TOTAL HEMOGLOBIN: 11.7 g/dL — AB (ref 12.0–16.0)

## 2015-03-15 MED ORDER — FUROSEMIDE 10 MG/ML IJ SOLN
40.0000 mg | Freq: Every day | INTRAMUSCULAR | Status: DC
  Filled 2015-03-15: qty 4

## 2015-03-15 MED ORDER — MIDAZOLAM HCL 2 MG/2ML IJ SOLN
2.0000 mg | Freq: Once | INTRAMUSCULAR | Status: AC
  Administered 2015-03-15: 2 mg via INTRAVENOUS
  Filled 2015-03-15: qty 2

## 2015-03-15 MED ORDER — FAMOTIDINE IN NACL 20-0.9 MG/50ML-% IV SOLN
20.0000 mg | INTRAVENOUS | Status: DC
  Administered 2015-03-15: 20 mg via INTRAVENOUS
  Filled 2015-03-15 (×2): qty 50

## 2015-03-15 MED ORDER — ALPRAZOLAM 0.5 MG PO TABS
0.5000 mg | ORAL_TABLET | Freq: Once | ORAL | Status: AC
  Administered 2015-03-15: 0.5 mg via ORAL
  Filled 2015-03-15: qty 1

## 2015-03-15 NOTE — Progress Notes (Signed)
Inpatient Diabetes Program Recommendations  AACE/ADA: New Consensus Statement on Inpatient Glycemic Control (2013)  Target Ranges:  Prepandial:   less than 140 mg/dL      Peak postprandial:   less than 180 mg/dL (1-2 hours)      Critically ill patients:  140 - 180 mg/dL   Reason for Assessment: Hyperglycemia  Diabetes history: None Outpatient Diabetes medications: None Current orders for Inpatient glycemic control: Novolog moderate Q4H   Results for Meagan [name], Meagan [name] (MRN [id number]) as of 03/15/2015 12:21  Ref. Range 03/14/2015 15:59 03/14/2015 20:10 03/14/2015 23:43 03/15/2015 03:41 03/15/2015 07:56 03/15/2015 11:46  Glucose-Capillary Latest Ref Range: 65-99 mg/dL 180 (H) 247 (H) 253 (H) 279 (H) 251 (H) 225 (H)   Inpatient Diabetes Program Recommendations Insulin - Basal: Add Levemir 10 units QHS Insulin - Meal Coverage: Add Novolog 2 units Q4 for TF coverage HgbA1C: Check HgbA1C to assess glycemic control prior to discharge Diet: When advanced CHO mod med  Note: Will follow closely.  Thank you. Lorenda Peck, RD, LDN, CDE Inpatient Diabetes Coordinator [phone]

## 2015-03-15 NOTE — Progress Notes (Addendum)
Advanced Heart Failure Rounding Note   Subjective:    Had good diuresis. CVP 9. Weaning vent. CVP 9. Remains anxious. Renal function stable. Co-ox 81%    Objective:   Weight Range:  Vital Signs:   Temp:  [97.5 F (36.4 C)-98.8 F (37.1 C)] 98.1 F (36.7 C) (05/24 0757) Pulse Rate:  [101-141] 119 (05/24 1000) Resp:  [10-35] 18 (05/24 1000) BP: (101-142)/(44-70) 112/50 mmHg (05/24 0842) SpO2:  [96 %-100 %] 98 % (05/24 1000) Arterial Line BP: (89-195)/(45-78) 114/48 mmHg (05/24 1000) FiO2 (%):  [40 %] 40 % (05/24 1000) Weight:  [67.6 kg (149 lb 0.5 oz)] 67.6 kg (149 lb 0.5 oz) (05/24 0436) Last BM Date: 03/14/15  Weight change: Filed Weights   03/13/15 0500 03/14/15 0500 03/15/15 0436  Weight: 64.7 kg (142 lb 10.2 oz) 66 kg (145 lb 8.1 oz) 67.6 kg (149 lb 0.5 oz)    Intake/Output:   Intake/Output Summary (Last 24 hours) at 03/15/15 1008 Last data filed at 03/15/15 0900  Gross per 24 hour  Intake 2121.28 ml  Output   2740 ml  Net -618.72 ml     Physical Exam: General: awake on vent .  HEENT: +ETT Neck: thick . Carotids 2+ bilat; no bruits. No lymphadenopathy or thryomegaly appreciated.  Cor: PMI nonpalpable Regular rate & rhythm. 2/6 AS radiates to bilateral carotids.  Hickman catheter ok Lungs: clear Abdomen: obese. soft, nontender, nondistended. No hepatosplenomegaly. No bruits or masses. Good bowel sounds.  Extremities: no cyanosis, + clubbing, macular-papular erythematous rash on LEs, trace edema  Neuro:moves all 4 without difficulty  Telemetry: Sinus tach   Labs: Basic Metabolic Panel:  Recent Labs Lab 03/11/15 0439 03/11/15 1154 03/11/15 2250 03/12/15 0340 03/12/15 1105 03/12/15 2336 03/13/15 0500 03/14/15 0232 03/15/15 0533  NA 135  --   --  136  --   --  139 139 139  K 3.0*  --   --  3.2*  --   --  4.7 4.4 4.5  CL 92*  --   --  93*  --   --  98* 96* 96*  CO2 33*  --   --  34*  --   --  33* 33* 31  GLUCOSE 190*  --   --  200*  --   --   205* 203* 305*  BUN 19  --   --  18  --   --  22* 25* 40*  CREATININE 0.93  --   --  0.86  --   --  0.89 0.91 1.08*  CALCIUM 8.4*  --   --  8.2*  --   --  8.8* 8.8* 9.2  MG 1.5* 2.8* 3.0* 2.7* 2.4 2.4  --   --   --   PHOS 3.0 2.9 3.0  --  3.1 3.6  --   --   --     Liver Function Tests:  Recent Labs Lab 03/15/15 0533  AST 34  ALT 25  ALKPHOS 109  BILITOT 0.6  PROT 6.2*  ALBUMIN 2.5*   No results for input(s): LIPASE, AMYLASE in the last 168 hours. No results for input(s): AMMONIA in the last 168 hours.  CBC:  Recent Labs Lab 03/08/2015 2330  03/11/15 0439 03/12/15 0340 03/13/15 0500 03/14/15 0232 03/15/15 0533  WBC 25.3*  < > 16.0* 19.1* 15.6* 15.9* 13.4*  NEUTROABS 22.0*  --  14.5* 16.9*  --   --  12.6*  HGB 12.6  < > 10.7* 11.0* 10.1*  11.0* 11.4*  HCT 41.4  < > 33.5* 35.0* 32.9* 35.6* 36.7  MCV 85.5  < > 80.1 80.5 83.1 83.0 80.8  PLT 299  < > 261 309 259 311 348  < > = values in this interval not displayed.  Cardiac Enzymes:  Recent Labs Lab 02/24/2015 2330 03/10/15 0417 03/10/15 0929 03/10/15 1500 03/15/15 0118  TROPONINI 0.11* 0.10* 0.08* 0.08* 0.88*    BNP: BNP (last 3 results)  Recent Labs  10/20/14 1400 03/03/2015 2330  BNP 224.2* 1442.0*    ProBNP (last 3 results) No results for input(s): PROBNP in the last 8760 hours.    Other results:  Imaging: Dg Chest Port 1 View  03/15/2015   CLINICAL DATA:  Pulmonary edema.  Shortness of breath.  EXAM: PORTABLE CHEST - 1 VIEW  COMPARISON:  03/14/2015.  FINDINGS: Endotracheal tube, NG tube, bilateral central lines in stable position. Prior CABG. Heart size stable. Interval improvement of interstitial prominence. These findings suggest improving interstitial edema superimposed on chronic interstitial lung disease. No acute new infiltrate. Lung bases are better aerated. No pleural effusion. No pneumothorax.  IMPRESSION: 1. Support apparatus in stable position. 2. Interval improvement of aeration of both  lungs suggesting improving interstitial edema and basilar atelectasis. Underlying chronic interstitial lung disease present. No new infiltrate. 3. Prior CABG.  Heart size stable .   Electronically Signed   By: [place]   On: 03/15/2015 07:02   Dg Chest Port 1 View  03/14/2015   CLINICAL DATA:  Acute respiratory failure.  EXAM: PORTABLE CHEST - 1 VIEW  COMPARISON:  03/13/2015, 07/29/2014, 02/23/2014, 07/22/2012.  FINDINGS: Endotracheal tube, NG tube, left subclavian line, right IJ line in stable position. Prior CABG. Persistent bilateral pulmonary interstitial prominence consistent with pulmonary interstitial edema, most likely superimposed on chronic interstitial lung disease present. No prominent pleural effusion. No pneumothorax.  IMPRESSION: 1.  Lines and tubes in stable position.  2. Prior CABG. Persistent cardiomegaly with diffuse bilateral from interstitial prominence most consistent with pulmonary interstitial edema superimposed on chronic interstitial lung disease. Active pneumonitis cannot be excluded .   Electronically Signed   By: Marcello Moores  Register   On: 03/14/2015 07:24     Medications:     Scheduled Medications: . antiseptic oral rinse  7 mL Mouth Rinse q12n4p  . chlorhexidine  15 mL Mouth Rinse BID  . clopidogrel  75 mg Oral Daily  . famotidine  20 mg Per Tube Daily  . furosemide  40 mg Intravenous TID  . heparin subcutaneous  5,000 Units Subcutaneous 3 times per day  . ice pack  1 each Other Q6H  . insulin aspart  0-15 Units Subcutaneous 6 times per day  . levofloxacin (LEVAQUIN) IV  500 mg Intravenous Q24H  . levothyroxine  25 mcg Oral QAC breakfast  . potassium chloride  20 mEq Per Tube Daily    Infusions: . epoprostenol 32.57 ng/kg/min (03/14/15 2001)  . feeding supplement (VITAL AF 1.2 CAL) 1,000 mL (03/15/15 0538)  . fentaNYL infusion INTRAVENOUS 100 mcg/hr (03/15/15 0844)    PRN Medications: sodium chloride, albuterol, diphenhydrAMINE, fentaNYL (SUBLIMAZE)  injection, hydrALAZINE, midazolam, ondansetron (ZOFRAN) IV   Assessment:   1. Acute on chronic respiratory failure - likely mixed picture 2. Acute on chronic diastolic HF 3. Pulmonary HTN on Flolan 4. CAD s/p CABG 5. HCAP 6. Hypomagensemia/hypokalemia 7. Hypertension   Plan/Discussion:    Volume status improved. Agree with trial of extubation.  My suspicion is that she will fly - may  need Bipap support. Continue diuretics. Continue flolan at current dose.   We discussed options if she fails extubation. She tells me she would not want tracheostomy. So plan will be trial of extubation with Bipap support if needed. If develops recurrent respiratory failure would not re-intubate and would proceed with comfort care. She is no re-intubation.   The patient is critically ill with multiple organ systems failure and requires high complexity decision making for assessment and support, frequent evaluation and titration of therapies, application of advanced monitoring technologies and extensive interpretation of multiple databases.   Critical Care Time devoted to patient care services described in this note is 35 Minutes.  Length of Stay: 5 Emmett Arntz MD 03/15/2015, 10:08 AM Advanced Heart Failure Team Pager [phone] (M-F; [place])  Please contact [facility] for night-coverage after hours (4p -7a ) and weekends on [email or web address]

## 2015-03-15 NOTE — Progress Notes (Signed)
eLink Physician-Brief Progress Note Patient Name: Meagan [name] DOB: [birth year] MRN: [id number]   Date of Service  03/15/2015  HPI/Events of Note  Call from nurse reporting that patient is experiencing chest pain.  EKG no change.  HR and BP are elevated.  Nurse is concerned re anxiety.  Has not responded to prn fentanyl or versed bolus.  Versed given at 8pm.  eICU Interventions  Plan: One time dose of 2mg  versed.   Continue to monitor patient     Intervention Category Intermediate Interventions: Pain - evaluation and management Minor Interventions: Agitation / anxiety - evaluation and management  DETERDING,ELIZABETH 03/15/2015, 12:49 AM

## 2015-03-15 NOTE — Procedures (Signed)
Extubation Procedure Note  Patient Details:   Name: Meagan [name] DOB: [birth year] MRN: [id number]   Airway Documentation:     Evaluation  O2 sats: stable throughout Complications: No apparent complications Patient did tolerate procedure well. Bilateral Breath Sounds: Clear, Diminished Suctioning: Airway Yes   Patient placed on 4L [place]. Patient is stable and tolerating well.  Beatris Si D 03/15/2015, 12:19 PM

## 2015-03-15 NOTE — Progress Notes (Signed)
CRITICAL VALUE ALERT  Critical value received:  Troponin 0.88  Date of notification:  03/15/15  Time of notification:  0229  Critical value read back:Yes.    Nurse who received alert:  Charmayne Sheer  MD notified (1st page):  [place]  Time of first page:  0242  MD notified (2nd page):  Time of second page:  Responding MD:  eLink  Time MD responded:  [phone]

## 2015-03-15 NOTE — Progress Notes (Addendum)
PULMONARY / CRITICAL CARE MEDICINE   Name: Meagan [name] MRN: [id number] DOB: 04/16/1946    ADMISSION DATE:  03/08/2015 CONSULTATION DATE:  03/15/2015  REFERRING MD :  EDP at [place]:  SOB  INITIAL PRESENTATION:  69 y.o. F with severe PH (on Flolan) brought to [facility] ED 5/19 with SOB.  Found to have HCAP and AoC hypercarbic respiratory failure.  Started on BiPAP and transferred to [facility] for further evaluation and management.    STUDIES:  12/2014 RHC RA = 16 RV = 85/5/18 PA = 80/20 (45) PCW = 22 Fick cardiac output/index = 4.3/2.6 PVR = 5.6 WU Ao sat = 89% PA sat = 45%, 47%  CXR 5/19 >>> low lung volumes, cardiomegaly, pulmonary edema. ECHO 5/20 >> LVEF 60-65%, no WMA, mild RV and RA dilation, RVSP 36mm Hg  SIGNIFICANT EVENTS: 5/19 - admitted to [facility] with HCAP and AoC hypercarbic respiratory failure  SUBJECTIVE: weaning , awake, less anxiety  VITAL SIGNS: Temp:  [97.5 F (36.4 C)-98.8 F (37.1 C)] 98.1 F (36.7 C) (05/24 0757) Pulse Rate:  [101-141] 119 (05/24 1000) Resp:  [10-35] 18 (05/24 1000) BP: (101-142)/(44-70) 112/50 mmHg (05/24 0842) SpO2:  [96 %-100 %] 98 % (05/24 1000) Arterial Line BP: (89-195)/(45-78) 114/48 mmHg (05/24 1000) FiO2 (%):  [40 %] 40 % (05/24 1059) Weight:  [67.6 kg (149 lb 0.5 oz)] 67.6 kg (149 lb 0.5 oz) (05/24 0436)   HEMODYNAMICS: CVP:  [5 mmHg-8 mmHg] 8 mmHg   VENTILATOR SETTINGS: Vent Mode:  [-] PSV;CPAP FiO2 (%):  [40 %] 40 % Set Rate:  [20 bmp] 20 bmp Vt Set:  [360 mL] 360 mL PEEP:  [5 cmH20] 5 cmH20 Pressure Support:  [8 cmH20-16 cmH20] 8 cmH20 Plateau Pressure:  [26 cmH20-28 cmH20] 26 cmH20   INTAKE / OUTPUT: Intake/Output      05/23 0701 - 05/24 0700 05/24 0701 - 05/25 0700   I.V. (mL/kg) 786 (11.6) 52.3 (0.8)   NG/GT 1320 100   IV Piggyback 100    Total Intake(mL/kg) 2206 (32.6) 152.3 (2.3)   Urine (mL/kg/hr) 2820 (1.7) 100 (0.4)   Stool 0 (0)    Total Output 2820 100   Net -614 +52.3        Stool Occurrence 1 x       PHYSICAL EXAMINATION: Gen: chronically ill appearing, anxiety better HENT: jvd down PULM: cta CV: RRR, 2/6 SEM, radiates to carotids  GI: BS+, soft, nontender Derm: mild erythema chest Psyche:rass 0 Neuro: following commands nonfocal   LABS:  CBC  Recent Labs Lab 03/13/15 0500 03/14/15 0232 03/15/15 0533  WBC 15.6* 15.9* 13.4*  HGB 10.1* 11.0* 11.4*  HCT 32.9* 35.6* 36.7  PLT 259 311 348   Coag's No results for input(s): APTT, INR in the last 168 hours.   BMET  Recent Labs Lab 03/13/15 0500 03/14/15 0232 03/15/15 0533  NA 139 139 139  K 4.7 4.4 4.5  CL 98* 96* 96*  CO2 33* 33* 31  BUN 22* 25* 40*  CREATININE 0.89 0.91 1.08*  GLUCOSE 205* 203* 305*   Electrolytes  Recent Labs Lab 03/11/15 2250 03/12/15 0340 03/12/15 1105 03/12/15 2336 03/13/15 0500 03/14/15 0232 03/15/15 0533  CALCIUM  --  8.2*  --   --  8.8* 8.8* 9.2  MG 3.0* 2.7* 2.4 2.4  --   --   --   PHOS 3.0  --  3.1 3.6  --   --   --  Sepsis Markers  Recent Labs Lab 03/10/15 0044 03/10/15 0455  03/10/15 0908 03/10/15 1000 03/11/15 0439 03/12/15 0340  LATICACIDVEN 0.87 1.2  --   --  0.5  --   --   PROCALCITON  --   --   < > 0.21  --  0.21 0.16  < > = values in this interval not displayed. ABG  Recent Labs Lab 03/16/2015 2333 03/10/15 0055 03/10/15 1019  PHART 7.345* 7.376 7.386  PCO2ART 72.2* 62.7* 60.6*  PO2ART 62.5* 60* 198.0*   Liver Enzymes  Recent Labs Lab 03/15/15 0533  AST 34  ALT 25  ALKPHOS 109  BILITOT 0.6  ALBUMIN 2.5*     Cardiac Enzymes  Recent Labs Lab 03/10/15 1500 03/15/15 0118 03/15/15 0830  TROPONINI 0.08* 0.88* 1.53*   Glucose  Recent Labs Lab 03/14/15 1135 03/14/15 1559 03/14/15 2010 03/14/15 2343 03/15/15 0341 03/15/15 0756  GLUCAP 163* 180* 247* 253* 279* 251*    Imaging 5/24 CXR > improved int changes  ASSESSMENT / PLAN:  PULMONARY A: Acute on chronic hypercarbic and hypoxemic respiratory failure - due to pulm  edema vs HCAP,  improving Severe PAH - on Flolan (follow[facility] Gilles [facility]iqu[facility] at [facility]) Restrictive lung disease P:   Daily SBT and WUA, PS to goal 5, would accept T V5cc/kg, rate 14 DNi with planned extubation, need to clarify DNR status Continue diuresis to neg 500 cc to 1 liter, was neg 388 See ID Continue Flolan Consider extubation to NIMV x 4 hrs  CARDIOVASCULAR A:  Shock - suspect septic more than cardiogenic  Co-ox 82% 5/21.  Resolved.  Hypertension - in setting of anxiety  Chronic dCHF - echo 5/19 with minimal RVSP, minimal RV dilation Troponin leak - demand ischemia vs NSTEMI Hx CAD s/p CABG P:  Lasix 40 mg QD, likely to reduce Continue flolan at dose Hydralazine to control BP  Lasix x 1 then hold, se renal  RENAL A:   CKD Hypomagnesemia  P:   bmet in am  Lasix to daily again with crt rise / BUN  GASTROINTESTINAL A:   GERD Nutrition P:   SUP:  Pepcid  Continue tube feedings, unless weaning well then hold  HEMATOLOGIC A:   Anemia - chronic, stable VTE Prophylaxis P:  Transfuse for Hgb < 7 SCD's / Heparin  INFECTIOUS A:   HCAP - LLL Drug Related Rash - with zosyn administration  P:   BCx2 5/19 >> Sputum Cx 5/19 >> UC 5/19 >> neg   Vanc, start date 5/19>>>5/20 Zosyn, start date 5/19, day 2/2 - d/c'd due to rash 5/21. Levaquin, start date 5/19>>>26th  Ensure stop date levofloxacin  ENDOCRINE A:   DM Hypothyroidism   P:   CBG's q4hr SSI Continue levothyroxine  NEUROLOGIC A:   Acute hypercarbic encephalopathy - resolved 5/21 Chronic pain Vent synchrony needs P:   RASS goal 0 to -1 Fentanyl gtt xanax   Family updated:  No family available at time of rounds.  Attempted call but no response.    Interdisciplinary Family Meeting v Palliative Care Meeting:  Due by: 5/25.   I have had extensive discussions with family and pt. We discussed patients current circumstances and organ failures. We also discussed patient's prior wishes under  circumstances such as this. Family has decided to NOT perform resuscitation if arrest but to continue current medical support for now.   Ccm time 30 min   Lavon Paganini. Titus Mould, MD, [place] Pgr: [facility]

## 2015-03-15 NOTE — Progress Notes (Signed)
Patient is resting comfortably on 4LNC and vitals are as follows: HR 119, RR 21, SPO2 99, and BP 110/49. Will continue to monitor.

## 2015-03-15 NOTE — Progress Notes (Signed)
Wasted 240 cc of Fentanyl in sink with Fabio Neighbors, RN.

## 2015-03-16 DIAGNOSIS — J9611 Chronic respiratory failure with hypoxia: Secondary | ICD-10-CM

## 2015-03-16 DIAGNOSIS — J811 Chronic pulmonary edema: Secondary | ICD-10-CM | POA: Insufficient documentation

## 2015-03-16 DIAGNOSIS — J9600 Acute respiratory failure, unspecified whether with hypoxia or hypercapnia: Secondary | ICD-10-CM

## 2015-03-16 LAB — GLUCOSE, CAPILLARY
GLUCOSE-CAPILLARY: 174 mg/dL — AB (ref 65–99)
GLUCOSE-CAPILLARY: 124 mg/dL — AB (ref 65–99)
Glucose-Capillary: 118 mg/dL — ABNORMAL HIGH (ref 65–99)
Glucose-Capillary: 149 mg/dL — ABNORMAL HIGH (ref 65–99)
Glucose-Capillary: 158 mg/dL — ABNORMAL HIGH (ref 65–99)
Glucose-Capillary: 89 mg/dL (ref 65–99)

## 2015-03-16 LAB — CARBOXYHEMOGLOBIN
Carboxyhemoglobin: 1.1 % (ref 0.5–1.5)
METHEMOGLOBIN: 1 % (ref 0.0–1.5)
O2 SAT: 80 %
Total hemoglobin: 10.6 g/dL — ABNORMAL LOW (ref 12.0–16.0)

## 2015-03-16 LAB — TROPONIN I
TROPONIN I: 4.01 ng/mL — AB (ref <0.031)
TROPONIN I: 3.63 ng/mL — AB (ref <0.031)
Troponin I: 3.8 ng/mL (ref <0.031)

## 2015-03-16 LAB — CLOSTRIDIUM DIFFICILE BY PCR: Toxigenic C. Difficile by PCR: NEGATIVE

## 2015-03-16 MED ORDER — HYDROMORPHONE HCL 2 MG PO TABS
2.0000 mg | ORAL_TABLET | Freq: Two times a day (BID) | ORAL | Status: DC
  Administered 2015-03-16: 2 mg via ORAL
  Filled 2015-03-16: qty 1

## 2015-03-16 MED ORDER — DULOXETINE HCL 30 MG PO CPEP
90.0000 mg | ORAL_CAPSULE | Freq: Every morning | ORAL | Status: DC
  Administered 2015-03-16 – 2015-03-19 (×3): 90 mg via ORAL
  Filled 2015-03-16 (×5): qty 1

## 2015-03-16 MED ORDER — PANTOPRAZOLE SODIUM 40 MG PO TBEC
40.0000 mg | DELAYED_RELEASE_TABLET | Freq: Every day | ORAL | Status: DC
  Administered 2015-03-17: 40 mg via ORAL
  Filled 2015-03-16 (×2): qty 1

## 2015-03-16 MED ORDER — BOOST PLUS PO LIQD
237.0000 mL | Freq: Three times a day (TID) | ORAL | Status: DC
  Administered 2015-03-17 – 2015-03-20 (×2): 237 mL via ORAL
  Filled 2015-03-16 (×19): qty 237

## 2015-03-16 MED ORDER — FUROSEMIDE 80 MG PO TABS
80.0000 mg | ORAL_TABLET | Freq: Every day | ORAL | Status: DC
Start: 2015-03-16 — End: 2015-03-18
  Administered 2015-03-16 – 2015-03-17 (×2): 80 mg via ORAL
  Filled 2015-03-16 (×4): qty 1

## 2015-03-16 MED ORDER — ISOSORBIDE MONONITRATE ER 30 MG PO TB24
30.0000 mg | ORAL_TABLET | Freq: Two times a day (BID) | ORAL | Status: DC
  Administered 2015-03-16 – 2015-03-19 (×6): 30 mg via ORAL
  Filled 2015-03-16 (×11): qty 1

## 2015-03-16 MED ORDER — ASPIRIN EC 81 MG PO TBEC
81.0000 mg | DELAYED_RELEASE_TABLET | Freq: Every day | ORAL | Status: DC
  Administered 2015-03-16 – 2015-03-17 (×2): 81 mg via ORAL
  Filled 2015-03-16 (×5): qty 1

## 2015-03-16 MED ORDER — DULOXETINE HCL 30 MG PO CPEP
30.0000 mg | ORAL_CAPSULE | Freq: Every morning | ORAL | Status: DC
  Filled 2015-03-16: qty 1

## 2015-03-16 MED ORDER — HEPARIN (PORCINE) IN NACL 100-0.45 UNIT/ML-% IJ SOLN
800.0000 [IU]/h | INTRAMUSCULAR | Status: DC
  Administered 2015-03-16 – 2015-03-18 (×2): 800 [IU]/h via INTRAVENOUS
  Filled 2015-03-16 (×2): qty 250

## 2015-03-16 MED ORDER — POTASSIUM CHLORIDE CRYS ER 20 MEQ PO TBCR
20.0000 meq | EXTENDED_RELEASE_TABLET | Freq: Every day | ORAL | Status: DC
  Administered 2015-03-16 – 2015-03-19 (×3): 20 meq via ORAL
  Filled 2015-03-16 (×7): qty 1

## 2015-03-16 MED ORDER — EZETIMIBE-SIMVASTATIN 10-40 MG PO TABS
1.0000 | ORAL_TABLET | Freq: Every evening | ORAL | Status: DC
  Administered 2015-03-16 – 2015-03-18 (×2): 1 via ORAL
  Filled 2015-03-16 (×4): qty 1

## 2015-03-16 MED ORDER — HYDROMORPHONE HCL 2 MG PO TABS
4.0000 mg | ORAL_TABLET | Freq: Four times a day (QID) | ORAL | Status: DC | PRN
  Administered 2015-03-16 – 2015-03-17 (×3): 4 mg via ORAL
  Filled 2015-03-16 (×3): qty 2

## 2015-03-16 NOTE — Progress Notes (Addendum)
Nutrition Follow-up  DOCUMENTATION CODES:  Severe malnutrition in context of chronic illness  INTERVENTION:   When diet advances, add Boost Plus PO TID, each supplement provides 360 kcal and 14 gm protein.  Recommend regular diet.   NUTRITION DIAGNOSIS:  Malnutrition related to chronic illness as evidenced by energy intake < or equal to 75% for > or equal to 1 month, percent weight loss.  Ongoing.  GOAL:  Patient will meet greater than or equal to 90% of their needs  Unmet.  MONITOR:  Diet advancement, PO intake, Labs, Weight trends, Skin  REASON FOR ASSESSMENT:  Consult Enteral/tube feeding initiation and management  ASSESSMENT:  Admitted with HCAP, pt with hx severe PAH and is on Flolan as outpatient and followed by [facility], also obesity, CAD s/p CABG and stent.   Patient was intubated from 5/19 - 5/24. Diet advanced to clear liquids yesterday evening. Patient was eating poorly at home PTA, drank chocolate Boost supplements. Hopeful for diet advancement today once CCM physician rounds.  Heart Failure Team following patient for CHF; weight trending down.  Height:  Ht Readings from Last 1 Encounters:  03/11/15 5' (1.524 m)    Weight:  Wt Readings from Last 1 Encounters:  03/16/15 144 lb 6.4 oz (65.5 kg)    Ideal Body Weight:  45.4 kg  Wt Readings from Last 10 Encounters:  03/16/15 144 lb 6.4 oz (65.5 kg)  03/03/15 135 lb (61.236 kg)  01/03/15 143 lb (64.864 kg)  12/07/14 142 lb (64.411 kg)  08/17/14 141 lb 1.5 oz (64 kg)  08/03/14 145 lb 15.1 oz (66.2 kg)  06/08/14 155 lb 3.3 oz (70.4 kg)  02/25/14 150 lb 5.7 oz (68.2 kg)  10/06/13 151 lb 12 oz (68.833 kg)  05/25/13 144 lb 12.8 oz (65.681 kg)    BMI:  Body mass index is 28.2 kg/(m^2).  Estimated Nutritional Needs:  Kcal:  1400-1600  Protein:  75-90 gm  Fluid:  1.4-1.6 L  Skin:  Wound (see comment) (stage II buttocks)  Diet Order:  Diet clear liquid Room service appropriate?: Yes; Fluid  consistency:: Thin  EDUCATION NEEDS:  No education needs identified at this time   Intake/Output Summary (Last 24 hours) at 03/16/15 1053 Last data filed at 03/16/15 0800  Gross per 24 hour  Intake 588.76 ml  Output   1025 ml  Net -436.24 ml    Last BM:  5/25   Molli Barrows, RD, LDN, [place] Pager [phone] After Hours Pager [phone]

## 2015-03-16 NOTE — Progress Notes (Signed)
Rehab Admissions Coordinator Note:  Patient was screened by [name] for appropriateness for an Inpatient Acute Rehab Consult.  At this time, we are recommending Inpatient Rehab consult.  [name] 03/16/2015, 1:37 PM  I can be reached at [phone].

## 2015-03-16 NOTE — Progress Notes (Signed)
Advanced Heart Failure Rounding Note   Subjective:    Extubated. Doing well on 4L [place]. Weight down 4 pounds.   Troponin mildly elevated yesterday at 2.7. No CP this am. Having problem with her hearing.    Objective:   Weight Range:  Vital Signs:   Temp:  [97.3 F (36.3 C)-97.6 F (36.4 C)] 97.3 F (36.3 C) (05/25 0417) Pulse Rate:  [113-126] 115 (05/25 0700) Resp:  [18-38] 32 (05/25 0700) BP: (83-123)/(35-87) 121/48 mmHg (05/25 0700) SpO2:  [92 %-100 %] 98 % (05/25 0700) Arterial Line BP: (89-132)/(42-50) 102/49 mmHg (05/24 1500) FiO2 (%):  [40 %] 40 % (05/24 1200) Weight:  [65.5 kg (144 lb 6.4 oz)] 65.5 kg (144 lb 6.4 oz) (05/25 0500) Last BM Date: 03/15/15  Weight change: Filed Weights   03/14/15 0500 03/15/15 0436 03/16/15 0500  Weight: 66 kg (145 lb 8.1 oz) 67.6 kg (149 lb 0.5 oz) 65.5 kg (144 lb 6.4 oz)    Intake/Output:   Intake/Output Summary (Last 24 hours) at 03/16/15 0812 Last data filed at 03/16/15 0700  Gross per 24 hour  Intake 715.51 ml  Output   1125 ml  Net -409.49 ml     Physical Exam: General: awake on vent .  HEENT: +ETT Neck: thick . Carotids 2+ bilat; no bruits. No lymphadenopathy or thryomegaly appreciated.  Cor: PMI nonpalpable Regular rate & rhythm. 2/6 AS radiates to bilateral carotids.  Hickman catheter ok Lungs: clear Abdomen: obese. soft, nontender, nondistended. No hepatosplenomegaly. No bruits or masses. Good bowel sounds.  Extremities: no cyanosis, + clubbing, macular-papular erythematous rash on LEs, trace edema  Neuro:moves all 4 without difficulty  Telemetry: Sinus tach   Labs: Basic Metabolic Panel:  Recent Labs Lab 03/11/15 0439 03/11/15 1154 03/11/15 2250 03/12/15 0340 03/12/15 1105 03/12/15 2336 03/13/15 0500 03/14/15 0232 03/15/15 0533  NA 135  --   --  136  --   --  139 139 139  K 3.0*  --   --  3.2*  --   --  4.7 4.4 4.5  CL 92*  --   --  93*  --   --  98* 96* 96*  CO2 33*  --   --  34*  --   --  33*  33* 31  GLUCOSE 190*  --   --  200*  --   --  205* 203* 305*  BUN 19  --   --  18  --   --  22* 25* 40*  CREATININE 0.93  --   --  0.86  --   --  0.89 0.91 1.08*  CALCIUM 8.4*  --   --  8.2*  --   --  8.8* 8.8* 9.2  MG 1.5* 2.8* 3.0* 2.7* 2.4 2.4  --   --   --   PHOS 3.0 2.9 3.0  --  3.1 3.6  --   --   --     Liver Function Tests:  Recent Labs Lab 03/15/15 0533  AST 34  ALT 25  ALKPHOS 109  BILITOT 0.6  PROT 6.2*  ALBUMIN 2.5*   No results for input(s): LIPASE, AMYLASE in the last 168 hours. No results for input(s): AMMONIA in the last 168 hours.  CBC:  Recent Labs Lab 03/07/2015 2330  03/11/15 0439 03/12/15 0340 03/13/15 0500 03/14/15 0232 03/15/15 0533  WBC 25.3*  < > 16.0* 19.1* 15.6* 15.9* 13.4*  NEUTROABS 22.0*  --  14.5* 16.9*  --   --  12.6*  HGB 12.6  < > 10.7* 11.0* 10.1* 11.0* 11.4*  HCT 41.4  < > 33.5* 35.0* 32.9* 35.6* 36.7  MCV 85.5  < > 80.1 80.5 83.1 83.0 80.8  PLT 299  < > 261 309 259 311 348  < > = values in this interval not displayed.  Cardiac Enzymes:  Recent Labs Lab 03/10/15 0929 03/10/15 1500 03/15/15 0118 03/15/15 0830 03/15/15 1230  TROPONINI 0.08* 0.08* 0.88* 1.53* 2.71*    BNP: BNP (last 3 results)  Recent Labs  10/20/14 1400 02/25/2015 2330  BNP 224.2* 1442.0*    ProBNP (last 3 results) No results for input(s): PROBNP in the last 8760 hours.    Other results:  Imaging: Dg Chest Port 1 View  03/15/2015   CLINICAL DATA:  Pulmonary edema.  Shortness of breath.  EXAM: PORTABLE CHEST - 1 VIEW  COMPARISON:  03/14/2015.  FINDINGS: Endotracheal tube, NG tube, bilateral central lines in stable position. Prior CABG. Heart size stable. Interval improvement of interstitial prominence. These findings suggest improving interstitial edema superimposed on chronic interstitial lung disease. No acute new infiltrate. Lung bases are better aerated. No pleural effusion. No pneumothorax.  IMPRESSION: 1. Support apparatus in stable position.  2. Interval improvement of aeration of both lungs suggesting improving interstitial edema and basilar atelectasis. Underlying chronic interstitial lung disease present. No new infiltrate. 3. Prior CABG.  Heart size stable .   Electronically Signed   By: [place]   On: 03/15/2015 07:02     Medications:     Scheduled Medications: . clopidogrel  75 mg Oral Daily  . famotidine (PEPCID) IV  20 mg Intravenous Q24H  . furosemide  40 mg Intravenous Daily  . heparin subcutaneous  5,000 Units Subcutaneous 3 times per day  . ice pack  1 each Other Q6H  . insulin aspart  0-15 Units Subcutaneous 6 times per day  . levofloxacin (LEVAQUIN) IV  500 mg Intravenous Q24H  . levothyroxine  25 mcg Oral QAC breakfast  . potassium chloride  20 mEq Per Tube Daily    Infusions: . epoprostenol 32.57 ng/kg/min (03/15/15 2113)    PRN Medications: sodium chloride, albuterol, diphenhydrAMINE, hydrALAZINE, ondansetron (ZOFRAN) IV   Assessment:   1. Acute on chronic respiratory failure - likely mixed picture 2. Acute on chronic diastolic HF 3. Pulmonary HTN on Flolan 4. CAD s/p CABG 5. HCAP 6. Hypomagensemia/hypokalemia 7. Hypertension  8. Elevated troponin - likely due to demand ischemia  Plan/Discussion:    She has been successfully extubated. Doing well.  Weight down 5 pounds. Renal function up. CVP 10.  Will switch diuretics back to po. Troponin mildly elevated. Wil repeat. Recent cath without amenable lesions. Will continue to treat medically unless troponin continues to climb. [facility] feels that some of her chronic jaw pain may be angina and not jaw claudication from Flolan can titrate nitrates or add Ranexa as needed. Continue Flolan.   Probably can go to SDU. We will continue to com-mange HF/PAH.   Excellent job by [employer] team.   Length of Stay: 6 Bensimhon, Daniel MD 03/16/2015, 8:12 AM Advanced Heart Failure Team Pager [phone] (M-F; [place])  Please contact [facility] for  night-coverage after hours (4p -7a ) and weekends on [email or web address]

## 2015-03-16 NOTE — Progress Notes (Signed)
Pt complaining of pain and anxiety, pt also had a run of SVT. Day shift RN Phineas Semen notified the MD. Orders given for her dilaudid to be given Q6.

## 2015-03-16 NOTE — Care Management Note (Signed)
Case Management Note  Patient Details  Name: Meagan [name] MRN: [id number] Date of Birth: [birth year]  Subjective/Objective:   Lives at home with husband.  Husband assists her with ADL's, ambulating and provides meals and medications including flolan.  At home she has walker, BSC, and shower chair.  PT recommending CIR.  Husband states she has been there before and are wonderful, but she expects him to be with her all the time and he can't be her all the time.  Husband feels that she would be fine at home with [facility] as before.  Has had [facility] in past with RN and PT, would also suggest SW for assistance with hospice/palliative care/community services available.  Will notify Rehab and will place referral for [facility], but we will need face to face and orders for [facility] RN, PT and SW.                 Action/Plan:   Expected Discharge Date:                  Expected Discharge Plan:  [place]  In-House Referral:     Discharge planning Services     Post Acute Care Choice:    Choice offered to:     DME Arranged:    DME Agency:  [place]:    [facility] Agency:     Status of Service:  In process, will continue to follow  Medicare Important Message Given:  Yes Date Medicare IM Given:  03/16/15 Medicare IM give by:  Luz Lex, RNBSN Date Additional Medicare IM Given:    Additional Medicare Important Message give by:     If discussed at [place] of Stay Meetings, dates discussed:    Additional Comments:  Vergie Living, RN 03/16/2015, 1:48 PM

## 2015-03-16 NOTE — Progress Notes (Signed)
PULMONARY / CRITICAL CARE MEDICINE   Name: Meagan [name] MRN: [id number] DOB: 04/13/1946    ADMISSION DATE:  03/20/2015 CONSULTATION DATE:  03/16/2015  REFERRING MD :  EDP at [place]:  SOB  INITIAL PRESENTATION:  69 y.o. F with severe PH (on Flolan) brought to [facility] ED 5/19 with SOB.  Found to have HCAP and AoC hypercarbic respiratory failure.  Started on BiPAP and transferred to [facility] for further evaluation and management.    STUDIES:  12/2014 RHC RA = 16 RV = 85/5/18 PA = 80/20 (45) PCW = 22 Fick cardiac output/index = 4.3/2.6 PVR = 5.6 WU Ao sat = 89% PA sat = 45%, 47%  CXR 5/19 >>> low lung volumes, cardiomegaly, pulmonary edema. ECHO 5/20 >> LVEF 60-65%, no WMA, mild RV and RA dilation, RVSP 9mm Hg  SIGNIFICANT EVENTS: 5/19 - admitted to [facility] with HCAP and AoC hypercarbic respiratory failure 5/24 extubtaed  SUBJECTIVE: no distress  VITAL SIGNS: Temp:  [97.3 F (36.3 C)-97.6 F (36.4 C)] 97.3 F (36.3 C) (05/25 0820) Pulse Rate:  [110-126] 110 (05/25 1000) Resp:  [19-38] 19 (05/25 1000) BP: (83-127)/(35-87) 119/56 mmHg (05/25 1000) SpO2:  [92 %-100 %] 99 % (05/25 1000) Arterial Line BP: (89-121)/(42-49) 102/49 mmHg (05/24 1500) FiO2 (%):  [40 %] 40 % (05/24 1200) Weight:  [65.5 kg (144 lb 6.4 oz)] 65.5 kg (144 lb 6.4 oz) (05/25 0500)   HEMODYNAMICS:     VENTILATOR SETTINGS: Vent Mode:  [-]  FiO2 (%):  [40 %] 40 %   INTAKE / OUTPUT: Intake/Output      05/24 0701 - 05/25 0700 05/25 0701 - 05/26 0700   P.O. 120    I.V. (mL/kg) 320.3 (4.9) 13.1 (0.2)   NG/GT 200.8    IV Piggyback 150    Total Intake(mL/kg) 791.1 (12.1) 13.1 (0.2)   Urine (mL/kg/hr) 1075 (0.7) 150 (0.5)   Stool 0 (0) 0 (0)   Total Output 1075 150   Net -283.9 -136.9        Stool Occurrence 7 x 3 x     PHYSICAL EXAMINATION: Gen: chronically ill appearing, anxiety better, in chair, no vent, no ett HENT: jvd flat PULM: coarse slight CV: RRR, 2/6 SEM, radiates to carotids  GI: BS+,  soft, nontender Derm: mild erythema chest unchanged Psyche:rass 0 Neuro: following commands nonfocal   LABS:  CBC  Recent Labs Lab 03/13/15 0500 03/14/15 0232 03/15/15 0533  WBC 15.6* 15.9* 13.4*  HGB 10.1* 11.0* 11.4*  HCT 32.9* 35.6* 36.7  PLT 259 311 348   Coag's No results for input(s): APTT, INR in the last 168 hours.   BMET  Recent Labs Lab 03/13/15 0500 03/14/15 0232 03/15/15 0533  NA 139 139 139  K 4.7 4.4 4.5  CL 98* 96* 96*  CO2 33* 33* 31  BUN 22* 25* 40*  CREATININE 0.89 0.91 1.08*  GLUCOSE 205* 203* 305*   Electrolytes  Recent Labs Lab 03/11/15 2250 03/12/15 0340 03/12/15 1105 03/12/15 2336 03/13/15 0500 03/14/15 0232 03/15/15 0533  CALCIUM  --  8.2*  --   --  8.8* 8.8* 9.2  MG 3.0* 2.7* 2.4 2.4  --   --   --   PHOS 3.0  --  3.1 3.6  --   --   --      Sepsis Markers  Recent Labs Lab 03/10/15 0044 03/10/15 0455  03/10/15 0908 03/10/15 1000 03/11/15 0439 03/12/15 0340  LATICACIDVEN 0.87 1.2  --   --  0.5  --   --   PROCALCITON  --   --   < > 0.21  --  0.21 0.16  < > = values in this interval not displayed. ABG  Recent Labs Lab 02/23/2015 2333 03/10/15 0055 03/10/15 1019  PHART 7.345* 7.376 7.386  PCO2ART 72.2* 62.7* 60.6*  PO2ART 62.5* 60* 198.0*   Liver Enzymes  Recent Labs Lab 03/15/15 0533  AST 34  ALT 25  ALKPHOS 109  BILITOT 0.6  ALBUMIN 2.5*     Cardiac Enzymes  Recent Labs Lab 03/15/15 0830 03/15/15 1230 03/16/15 1000  TROPONINI 1.53* 2.71* 4.01*   Glucose  Recent Labs Lab 03/15/15 1146 03/15/15 1629 03/15/15 1941 03/15/15 2358 03/16/15 0414 03/16/15 0813  GLUCAP 225* 133* 253* 158* 89 118*    Imaging 5/24 CXR > improved int changes  ASSESSMENT / PLAN:  PULMONARY A: Acute on chronic hypercarbic and hypoxemic respiratory failure - due to pulm edema vs HCAP,  improving Severe PAH - on Flolan (followed by Dr. Gilles Chiquito at [facility]) Restrictive lung disease P:   IS DNi Lasix per  cards sats goal greater 93% See ID Continue Flolan  CARDIOVASCULAR A:  Shock - suspect septic more than cardiogenic  Co-ox 82% 5/21.  Resolved.  Hypertension - in setting of anxiety  Chronic dCHF - echo 5/19 with minimal RVSP, minimal RV dilation Troponin leak - demand ischemia vs NSTEMI Hx CAD s/p CABG P:  Lasix Avoid desats Continue flolan at dose Hydralazine to control BP  Lasix per cards Trop per cards Keep tele  RENAL A:   CKD Hypomagnesemia  P:   bmet in am   GASTROINTESTINAL A:   GERD Nutrition P:   SUP:  Pepcid to oral Advance diet  HEMATOLOGIC A:   Anemia - chronic, stable VTE Prophylaxis P:  Transfuse for Hgb < 7 SCD's / Heparin  INFECTIOUS A:   HCAP - LLL Drug Related Rash - with zosyn administration  P:   BCx2 5/19 >> Sputum Cx 5/19 >> UC 5/19 >> neg   Vanc, start date 5/19>>>5/20 Zosyn, start date 5/19, day 2/2 - d/c'd due to rash 5/21. Levaquin, start date 5/19>>>26th  Stop levofloxacin in am   ENDOCRINE A:   DM Hypothyroidism   P:   CBG's q4hr SSI Continue levothyroxine  NEUROLOGIC A:   Acute hypercarbic encephalopathy - resolved 5/21 Chronic pain Vent synchrony needs P:   RASS goal 0 to -1 Xanax Re evaluate home meds   Family updated:  fam meeting done 24th df    Interdisciplinary Family Meeting v Palliative Care Meeting:  Done df: 5/25  To med floor, tele, pulse ox  Lavon Paganini. Titus Mould, MD, [place] Pgr: [facility]

## 2015-03-16 NOTE — Progress Notes (Signed)
Received call from [facility]. Husband does not wish to pursue an inpt rehab admission. We will cancel consult. [phone]

## 2015-03-16 NOTE — Evaluation (Signed)
Physical Therapy Evaluation Patient Details Name: Meagan [name] MRN: [id number] DOB: Mar 13, 1946 Today's Date: 03/16/2015   History of Present Illness  Pt is a 69 y/o female with multiple medical problems including CAD s/p CABG and RCA stent, DM2, obesity, fibromyalgia, diastolic HF, and severe PAH. [facility] to [facility] with [facility]B and was found to have HCAP and AoC hypercarbic respiratory failure. She was started on BiPAP and transferred to [facility] where she was intubated until 03/15/15.  Clinical Impression  Pt admitted with above diagnosis. Pt currently with functional limitations due to the deficits listed below (see PT Problem List). At the time of PT eval pt was able to perform transfers with +2 assist for safety and balance support. Pt will benefit from skilled PT to increase their independence and safety with mobility to allow discharge to the venue listed below.       Follow Up Recommendations CIR    Equipment Recommendations  None recommended by PT    Recommendations for Other Services Rehab consult     Precautions / Restrictions Precautions Precautions: Fall Restrictions Weight Bearing Restrictions: No      Mobility  Bed Mobility Overal bed mobility: Needs Assistance;+2 for physical assistance Bed Mobility: Rolling;Sidelying to Sit Rolling: Min assist Sidelying to sit: Mod assist;+2 for physical assistance       General bed mobility comments: Assist for movement of LE's to EOB and for trunk support as pt elevates to full sitting position. VC's for sequencing and technique required.   Transfers Overall transfer level: Needs assistance Equipment used: Rolling walker (2 wheeled) Transfers: Sit to/from [facility] Sit to Stand: Min assist;+2 physical assistance Stand pivot transfers: Min guard;+2 safety/equipment       General transfer comment: Assist to power-up to full standing, and close guard for safety required as pt took pivotal steps around to the  chair.   Ambulation/Gait             General Gait Details: Unable at this time.   Stairs            Wheelchair Mobility    Modified Rankin (Stroke Patients Only)       Balance Overall balance assessment: Needs assistance Sitting-balance support: Feet supported;No upper extremity supported Sitting balance-Leahy Scale: Fair     Standing balance support: Bilateral upper extremity supported Standing balance-Leahy Scale: Poor                               Pertinent Vitals/Pain Pain Assessment: Faces Faces Pain Scale: Hurts a little bit Pain Location: Pt states her bottom is sore. Appears uncomfortable. Pain Descriptors / Indicators: Tender Pain Intervention(s): Limited activity within patient's tolerance;Monitored during session;Repositioned    Home Living Family/patient expects to be discharged to:: Private residence Living Arrangements: Spouse/significant other Available Help at Discharge: Family;Available 24 hours/day Type of Home: House Home Access: Stairs to enter Entrance Stairs-Rails: None Entrance Stairs-Number of Steps: 2 in front, 5 in back with handrail on both sides but can only reach 1, 5 on side with B handrails Home Layout: One level Home Equipment: Walker - 4 wheels;Cane - single point;Shower seat - built in;Bedside commode;Electric scooter;Grab bars - tub/shower      Prior Function Level of Independence: Needs assistance   Gait / Transfers Assistance Needed: Uses a walker all the time. Per husband, pt does not feel like she has fully healed from her fracture in October.   ADL's /  Homemaking Assistance Needed: Husband assists her with LB washing/dressing and provides assist for her to step in/out of the shower.         Hand Dominance   Dominant Hand: Right    Extremity/Trunk Assessment   Upper Extremity Assessment: Defer to OT evaluation;Generalized weakness           Lower Extremity Assessment: Generalized weakness       Cervical / Trunk Assessment: Kyphotic  Communication   Communication: HOH  Cognition Arousal/Alertness: Awake/alert Behavior During Therapy: Flat affect Overall Cognitive Status: Within Functional Limits for tasks assessed                      General Comments      Exercises        Assessment/Plan    PT Assessment Patient needs continued PT services  PT Diagnosis Difficulty walking;Generalized weakness   PT Problem List Decreased strength;Decreased range of motion;Decreased activity tolerance;Decreased balance;Decreased mobility;Decreased knowledge of use of DME;Decreased safety awareness;Decreased knowledge of precautions;Cardiopulmonary status limiting activity  PT Treatment Interventions DME instruction;Gait training;Stair training;Functional mobility training;Therapeutic activities;Therapeutic exercise;Neuromuscular re-education;Patient/family education   PT Goals (Current goals can be found in the Care Plan section) Acute Rehab PT Goals Patient Stated Goal: Home eventually PT Goal Formulation: With patient Time For Goal Achievement: 03/30/15 Potential to Achieve Goals: Good    Frequency Min 3X/week   Barriers to discharge        Co-evaluation               End of Session Equipment Utilized During Treatment: Gait belt;Oxygen Activity Tolerance: Patient limited by fatigue Patient left: in chair;with call bell/phone within reach;with family/visitor present Nurse Communication: Mobility status         Time: 1030-1056 PT Time Calculation (min) (ACUTE ONLY): 26 min   Charges:   PT Evaluation $Initial PT Evaluation Tier I: 1 Procedure PT Treatments $Therapeutic Activity: 8-22 mins   PT G Codes:        Rolinda Roan 04-02-15, 12:31 PM   Rolinda Roan, PT, DPT Acute Rehabilitation Services Pager: [phone]

## 2015-03-16 NOTE — Progress Notes (Signed)
ANTICOAGULATION CONSULT NOTE - Initial Consult  Pharmacy Consult for heparin Indication: chest pain/ACS  Allergies  Allergen Reactions  . Phenergan Fortis [Promethazine] Other (See Comments)    Family and pt states becomes very confused and wild.   . Codeine Other (See Comments)    Messes with digestive system  . Niacin Other (See Comments)    Skin tingling symptoms  . Nsaids Other (See Comments)    REACTION: Aggravates Digestive System   . Sulfonamide Derivatives Other (See Comments)    Messes with digestive system  . Aspirin Other (See Comments)    Messes with digestive system, possibly only has reaction to 325mg  dose, currently takes ASA 81mg   . Zosyn [Piperacillin Sod-Tazobactam So] Rash    Diffuse erythematous rash     Patient Measurements: Height: 5' (152.4 cm) Weight: 144 lb 6.4 oz (65.5 kg) IBW/kg (Calculated) : 45.5 Heparin Dosing Weight: 65kg  Vital Signs: Temp: 98 F (36.7 C) (05/25 1158) Temp Source: Oral (05/25 1158) BP: 121/54 mmHg (05/25 1100) Pulse Rate: 110 (05/25 1400)  Labs:  Recent Labs  03/14/15 0232  03/15/15 0533 03/15/15 0830 03/15/15 1230 03/16/15 1000  HGB 11.0*  --  11.4*  --   --   --   HCT 35.6*  --  36.7  --   --   --   PLT 311  --  348  --   --   --   CREATININE 0.91  --  1.08*  --   --   --   TROPONINI  --   < >  --  1.53* 2.71* 4.01*  < > = values in this interval not displayed.  Estimated Creatinine Clearance: 40.9 mL/min (by C-G formula based on Cr of 1.08).   Medical History: Past Medical History  Diagnosis Date  . Fibromyalgia   . Hypercholesterolemia   . Hypertension     pulmonary  pap 110/31 (mean 62)by RHC 9/10 negative PE by chest CT 2010  . Pulmonary edema     Failed Revatio   . ARF (acute renal failure)     poor candidate for ACE -I or ARB  . Restrictive lung disease     obesity hypoventilation syndrome  . Renal insufficiency   . CAD (coronary artery disease)     multivessel DES x 2 RCA 2007  . Chronic  diastolic heart failure   . MRSA (methicillin resistant staphylococcus aureus) pneumonia   . Pulmonary hypertension   . Chronic anemia     anemia of chronic disease based on anemia panel 2011  . Pancreatic mass 08/01/2012  . PONV (postoperative nausea and vomiting)   . Heart murmur     "when I was small"  . CHF (congestive heart failure)   . Anginal pain   . Pneumonia X 3  . Type II diabetes mellitus   . Hypothyroidism   . H/O hiatal hernia   . GERD (gastroesophageal reflux disease)   . Anxiety   . On home oxygen therapy     "6L all the time" (02/24/2014  . Carotid artery disease    Assessment: 69 year old female with severe PAH on flolan therapy. Admitted with SOB, extubated this morning.   Patient now with a bump in troponin to 4, orders received from cardiology to start heparin infusion for rule out ACS. Patient received sq heparin less than an hour ago so will bypass bolus for now. Patient has known ACS with stents placed in 2007.  No issues with flolan  pump noted, no adjustments warranted at this time.  Goal of Therapy:  Heparin level 0.3-0.7 units/ml Monitor platelets by anticoagulation protocol: Yes   Plan:  Start heparin infusion at 800 units/hr Check anti-Xa level in 8 hours and daily while on heparin Continue to monitor H&H and platelets  D/c sub Q heparin  Erin Hearing PharmD., BCPS Clinical Pharmacist Pager [phone] 03/16/2015 2:49 PM

## 2015-03-17 ENCOUNTER — Inpatient Hospital Stay (HOSPITAL_COMMUNITY): Payer: Medicare Other

## 2015-03-17 DIAGNOSIS — L8990 Pressure ulcer of unspecified site, unspecified stage: Secondary | ICD-10-CM | POA: Insufficient documentation

## 2015-03-17 LAB — BASIC METABOLIC PANEL
ANION GAP: 12 (ref 5–15)
BUN: 41 mg/dL — AB (ref 6–20)
CALCIUM: 8.2 mg/dL — AB (ref 8.9–10.3)
CO2: 28 mmol/L (ref 22–32)
CREATININE: 0.99 mg/dL (ref 0.44–1.00)
Chloride: 98 mmol/L — ABNORMAL LOW (ref 101–111)
GFR calc Af Amer: >60 mL/min (ref >60)
GFR calc non Af Amer: 56 mL/min — ABNORMAL LOW (ref >60)
GLUCOSE: 116 mg/dL — AB (ref 65–99)
Potassium: 3.7 mmol/L (ref 3.5–5.1)
SODIUM: 138 mmol/L (ref 135–145)

## 2015-03-17 LAB — BLOOD GAS, ARTERIAL
Acid-Base Excess: 7.4 mmol/L — ABNORMAL HIGH (ref 0.0–2.0)
Bicarbonate: 31.2 mEq/L — ABNORMAL HIGH (ref 20.0–24.0)
Delivery systems: POSITIVE
Drawn by: 365291
EXPIRATORY PAP: 6
FIO2: 0.5 %
Inspiratory PAP: 14
O2 Saturation: 93.8 %
PCO2 ART: 42.9 mmHg (ref 35.0–45.0)
Patient temperature: 98.6
TCO2: 32.5 mmol/L (ref 0–100)
pH, Arterial: 7.475 — ABNORMAL HIGH (ref 7.350–7.450)
pO2, Arterial: 68.4 mmHg — ABNORMAL LOW (ref 80.0–100.0)

## 2015-03-17 LAB — GLUCOSE, CAPILLARY
GLUCOSE-CAPILLARY: 144 mg/dL — AB (ref 65–99)
Glucose-Capillary: 110 mg/dL — ABNORMAL HIGH (ref 65–99)
Glucose-Capillary: 120 mg/dL — ABNORMAL HIGH (ref 65–99)
Glucose-Capillary: 140 mg/dL — ABNORMAL HIGH (ref 65–99)
Glucose-Capillary: 164 mg/dL — ABNORMAL HIGH (ref 65–99)

## 2015-03-17 LAB — CARBOXYHEMOGLOBIN
CARBOXYHEMOGLOBIN: 1.1 % (ref 0.5–1.5)
Methemoglobin: 0.9 % (ref 0.0–1.5)
O2 Saturation: 72.6 %
TOTAL HEMOGLOBIN: 8.9 g/dL — AB (ref 12.0–16.0)

## 2015-03-17 LAB — MAGNESIUM: Magnesium: 2 mg/dL (ref 1.7–2.4)

## 2015-03-17 LAB — CBC
HCT: 27.9 % — ABNORMAL LOW (ref 36.0–46.0)
Hemoglobin: 8.9 g/dL — ABNORMAL LOW (ref 12.0–15.0)
MCH: 25.6 pg — AB (ref 26.0–34.0)
MCHC: 31.9 g/dL (ref 30.0–36.0)
MCV: 80.4 fL (ref 78.0–100.0)
Platelets: 324 10*3/uL (ref 150–400)
RBC: 3.47 MIL/uL — AB (ref 3.87–5.11)
RDW: 18.3 % — AB (ref 11.5–15.5)
WBC: 19.8 10*3/uL — ABNORMAL HIGH (ref 4.0–10.5)

## 2015-03-17 LAB — PHOSPHORUS: Phosphorus: 4.4 mg/dL (ref 2.5–4.6)

## 2015-03-17 LAB — AMMONIA: Ammonia: 21 umol/L (ref 9–35)

## 2015-03-17 LAB — HEPARIN LEVEL (UNFRACTIONATED)
HEPARIN UNFRACTIONATED: 0.63 [IU]/mL (ref 0.30–0.70)
Heparin Unfractionated: 0.58 IU/mL (ref 0.30–0.70)

## 2015-03-17 MED ORDER — NALOXONE HCL 0.4 MG/ML IJ SOLN
INTRAMUSCULAR | Status: AC
  Administered 2015-03-17: 0.4 mg
  Filled 2015-03-17: qty 1

## 2015-03-17 MED ORDER — OXYMETAZOLINE HCL 0.05 % NA SOLN
2.0000 | Freq: Two times a day (BID) | NASAL | Status: DC
  Administered 2015-03-18 – 2015-03-20 (×4): 2 via NASAL
  Filled 2015-03-17: qty 15

## 2015-03-17 MED ORDER — FUROSEMIDE 10 MG/ML IJ SOLN
80.0000 mg | Freq: Once | INTRAMUSCULAR | Status: AC
  Administered 2015-03-17: 80 mg via INTRAVENOUS

## 2015-03-17 MED ORDER — FUROSEMIDE 10 MG/ML IJ SOLN
INTRAMUSCULAR | Status: AC
  Filled 2015-03-17: qty 8

## 2015-03-17 MED ORDER — HYDROMORPHONE HCL 2 MG PO TABS
1.0000 mg | ORAL_TABLET | Freq: Four times a day (QID) | ORAL | Status: DC | PRN
  Administered 2015-03-18 – 2015-03-19 (×2): 1 mg via ORAL
  Filled 2015-03-17 (×2): qty 1

## 2015-03-17 NOTE — Progress Notes (Signed)
ANTICOAGULATION CONSULT NOTE - Follow UP Consult  Pharmacy Consult for heparin Indication: chest pain/ACS  Allergies  Allergen Reactions  . Phenergan Fortis [Promethazine] Other (See Comments)    Family and pt states becomes very confused and wild.   . Codeine Other (See Comments)    Messes with digestive system  . Niacin Other (See Comments)    Skin tingling symptoms  . Nsaids Other (See Comments)    REACTION: Aggravates Digestive System   . Sulfonamide Derivatives Other (See Comments)    Messes with digestive system  . Aspirin Other (See Comments)    Messes with digestive system, possibly only has reaction to 325mg  dose, currently takes ASA 81mg   . Zosyn [Piperacillin Sod-Tazobactam So] Rash    Diffuse erythematous rash     Patient Measurements: Height: 5' (152.4 cm) Weight: 139 lb (63.05 kg) IBW/kg (Calculated) : 45.5 Heparin Dosing Weight: 65kg  Vital Signs: Temp: 98.2 F (36.8 C) (05/26 0700) Temp Source: Oral (05/26 0700) BP: 95/53 mmHg (05/26 0700) Pulse Rate: 112 (05/26 0700)  Labs:  Recent Labs  03/15/15 0533  03/16/15 1000 03/16/15 1355 03/16/15 2200 03/17/15 0112 03/17/15 0851  HGB 11.4*  --   --   --   --  8.9*  --   HCT 36.7  --   --   --   --  27.9*  --   PLT 348  --   --   --   --  324  --   HEPARINUNFRC  --   --   --   --   --  0.58 0.63  CREATININE 1.08*  --   --   --   --  0.99  --   TROPONINI  --   < > 4.01* 3.80* 3.63*  --   --   < > = values in this interval not displayed.  Estimated Creatinine Clearance: 43.8 mL/min (by C-G formula based on Cr of 0.99).  Assessment: 69 year old female with severe PAH on flolan therapy. Admitted with SOB, extubated this morning.   Patient now with a bump in troponin to 4, orders received from cardiology to start heparin infusion for rule out ACS. Patient received sq heparin less than an hour ago so will bypass bolus for now. Patient has known ACS with stents placed in 2007.  No issues with flolan pump  noted, spoke with 2h nurse regarding ice packs, cartridge exchange procedure and timing she verbalized understanding. No adjustments warranted at this time.  Initial HL is therapeutic at 0.63 on heparin 800 units/hr. No issues with infusion or bleeding noted.  Goal of Therapy:  Heparin level 0.3-0.7 units/ml Monitor platelets by anticoagulation protocol: Yes   Plan:  Continue heparin 800 units/hr Continue to monitor daily Xa level, H&H and platelets   Thank you for allowing pharmacy to be a part of this patients care team.  Rowe Robert Pharm.D., BCPS, AQ-Cardiology Clinical Pharmacist 03/17/2015 9:37 AM Pager: [phone] Phone: [phone]

## 2015-03-17 NOTE — Progress Notes (Signed)
Pt with period of apnea when lying supine in bed for linen change/incontinent episode; oxygen sats 65% ambu bag applied and quickly changed to non-rebreather; MD's at bedside; family at bedside; orders received: ABG completed, lasix and narcan given; bipap applied; family at bedside with questions encouraged and answered; will continue to closely monitor

## 2015-03-17 NOTE — Progress Notes (Signed)
Report given to Brittany, RN.

## 2015-03-17 NOTE — Progress Notes (Signed)
eLink Physician-Brief Progress Note Patient Name: Meagan [name] DOB: 1946/06/13 MRN: [id number]   Date of Service  03/17/2015  HPI/Events of Note  IV team "does not know where tunneled central catheter is" and requests CXR to confirm placement.   eICU Interventions  Will order a portable CXR now.      Intervention Category Minor Interventions: Clinical assessment - ordering diagnostic tests  [name] Cornelia Copa 03/17/2015, 2:04 AM

## 2015-03-17 NOTE — Progress Notes (Signed)
Called to room with stat situation for "line pulled out by patient". Found L [place] TL with drsg off but still intact at sutured hub with no length retracted. Redressed. Found R IJ tunneled [place] with drsg off and large length exposed. Primary RN unsure of how much was exposed prior to event. Flolan infusing to site via home pump. Primary RN states husband managing pump and medication. Husband not in hospital. Flolan switched to L [place] TL. Blood aspirated from [place]. !0 cc NS flush. Site redressed. MD ordered stat PCXR to confirm tip of lines. Awaiting results and orders.

## 2015-03-17 NOTE — Progress Notes (Signed)
Received a call from tele that all leads were off. RN went into pts room and the patient's Left PICC and R IJ tunneled [place] with dressing off. IV team notified and at bedside. Flolan infusing to site via home pump. Husband manages pump and medication. Husband not at bedside. IV team switched Flolan to L [place] TL Flolan. On call MD notified, stat chest xray ordered. Husband notified and is on his way to the hospital. Will continue to monitor.

## 2015-03-17 NOTE — Progress Notes (Signed)
Pt transferred to 2 Heart

## 2015-03-17 NOTE — Progress Notes (Signed)
ANTICOAGULATION CONSULT NOTE - Initial Consult  Pharmacy Consult for heparin Indication: chest pain/ACS  Allergies  Allergen Reactions  . Phenergan Fortis [Promethazine] Other (See Comments)    Family and pt states becomes very confused and wild.   . Codeine Other (See Comments)    Messes with digestive system  . Niacin Other (See Comments)    Skin tingling symptoms  . Nsaids Other (See Comments)    REACTION: Aggravates Digestive System   . Sulfonamide Derivatives Other (See Comments)    Messes with digestive system  . Aspirin Other (See Comments)    Messes with digestive system, possibly only has reaction to 325mg  dose, currently takes ASA 81mg   . Zosyn [Piperacillin Sod-Tazobactam So] Rash    Diffuse erythematous rash     Patient Measurements: Height: 5' (152.4 cm) Weight: 144 lb 6.4 oz (65.5 kg) IBW/kg (Calculated) : 45.5 Heparin Dosing Weight: 65kg  Vital Signs: Temp: 97.8 F (36.6 C) (05/25 2104) Temp Source: Oral (05/25 2104) BP: 95/46 mmHg (05/25 2104) Pulse Rate: 110 (05/25 2104)  Labs:  Recent Labs  03/15/15 0533  03/16/15 1000 03/16/15 1355 03/16/15 2200 03/17/15 0112  HGB 11.4*  --   --   --   --  8.9*  HCT 36.7  --   --   --   --  27.9*  PLT 348  --   --   --   --  324  HEPARINUNFRC  --   --   --   --   --  0.58  CREATININE 1.08*  --   --   --   --  0.99  TROPONINI  --   < > 4.01* 3.80* 3.63*  --   < > = values in this interval not displayed.  Estimated Creatinine Clearance: 44.7 mL/min (by C-G formula based on Cr of 0.99).   Medical History: Past Medical History  Diagnosis Date  . Fibromyalgia   . Hypercholesterolemia   . Hypertension     pulmonary  pap 110/31 (mean 62)by [facility] 9/10 negative PE by chest CT 2010  . Pulmonary edema     Failed Revatio   . ARF (acute renal failure)     poor candidate for ACE -I or ARB  . Restrictive lung disease     obesity hypoventilation syndrome  . Renal insufficiency   . CAD (coronary artery disease)      multivessel DES x 2 RCA 2007  . Chronic diastolic heart failure   . MRSA (methicillin resistant staphylococcus aureus) pneumonia   . Pulmonary hypertension   . Chronic anemia     anemia of chronic disease based on anemia panel 2011  . Pancreatic mass 08/01/2012  . PONV (postoperative nausea and vomiting)   . Heart murmur     "when I was small"  . CHF (congestive heart failure)   . Anginal pain   . Pneumonia X 3  . Type II diabetes mellitus   . Hypothyroidism   . H/O hiatal hernia   . GERD (gastroesophageal reflux disease)   . Anxiety   . On home oxygen therapy     "6L all the time" (02/24/2014  . Carotid artery disease    Assessment: 69 year old female with severe PAH on flolan therapy. Admitted with SOB, extubated this morning.   Patient now with a bump in troponin to 4, orders received from cardiology to start heparin infusion for rule out ACS. Patient received sq heparin less than an hour ago so will  bypass bolus for now. Patient has known ACS with stents placed in 2007.  No issues with flolan pump noted, no adjustments warranted at this time.  Initial HL is therapeutic at 0.58 on heparin 800 units/hr. No issues with infusion or bleeding noted.  Goal of Therapy:  Heparin level 0.3-0.7 units/ml Monitor platelets by anticoagulation protocol: Yes   Plan:  Continue heparin 800 units/hr Check anti-Xa level in 8 hours and daily while on heparin Continue to monitor H&H and platelets   Andrey Cota. Diona Foley, PharmD Clinical Pharmacist Pager [phone]  03/17/2015 2:38 AM

## 2015-03-17 NOTE — Progress Notes (Signed)
eLink Physician-Brief Progress Note Patient Name: Meagan [name] DOB: June 30, 1946 MRN: [id number]   Date of Service  03/17/2015  HPI/Events of Note  Called d/t assertion by floor nurse that the  patient can't be on Flolan IV infusion on medical floor. The patient needs to be on Step Down Unit status.   eICU Interventions  Will transfer the patient to a Step Down unit bed.      Intervention Category Minor Interventions: Routine modifications to care plan (e.g. PRN medications for pain, fever)  Sommer,Steven Eugene 03/17/2015, 3:47 AM

## 2015-03-17 NOTE — Progress Notes (Signed)
[facility] PROGRESS NOTE  Meagan [name] YKD:[id number] DOB: 1946-09-21 DOA: 03/02/2015 PCP: Glo Herring., MD   From original HPI: 69 y.o. F with severe PH (on Flolan) brought to [facility] ED 5/19 with SOB. Found to have HCAP and AoC hypercarbic respiratory failure. Started on BiPAP and transferred to [facility] for further evaluation and management.   Code blue was called due to hypoxic episode. This was canceled as patient is DNR. Reportedly patient had recently received dilauded and narcan was administered of which patient became more alert. Cardiology also evaluated patient and plans were put in place to add IV lasix. Pt is currently sitting up in bed with Bipap in place.  Assessment/Plan: Active Problems:   Sepsis - Patient has been treated with 8 days of antibiotic therapy. First 3 days were treated with Vancomycin and Zosyn - Will continue levaquin at this juncture    Acute respiratory failure - Consulted critical care team to assess patient and optimize medical management. - Placed on bipap - repeat chest x ray    Acute pulmonary edema - Cardiology on board and assisting with management - Will place order for foley catheter insertion - agree with lasix administration - monitor intake and output    Pneumonia, organism unspecified - obtain chest x ray and continue antibiotic regimen listed above.   Code Status: DNR Family Communication: discussed with spouse at bedside Disposition Plan: Pending improvement in respiratory condition   Consultants:  Cardiology  PCCM  Procedures:  None  Antibiotics:  levaquin  HPI/Subjective: Patient is currently on Bipap  Objective: Filed Vitals:   03/17/15 1254  BP: 111/44  Pulse: 114  Temp: 98 F (36.7 C)  Resp: 29    Intake/Output Summary (Last 24 hours) at 03/17/15 1422 Last data filed at 03/17/15 1200  Gross per 24 hour  Intake 946.96 ml  Output    400 ml  Net 546.96 ml   Filed Weights   03/15/15 0436  03/16/15 0500 03/17/15 0500  Weight: 67.6 kg (149 lb 0.5 oz) 65.5 kg (144 lb 6.4 oz) 63.05 kg (139 lb)    Exam:   General:  Pt on bipap, sitting up in bed arrousable  Cardiovascular: rrr, no rubs  Respiratory: decreased breath sounds at left lung base, no wheezes, + rhales  Abdomen: soft, nd, nt  Musculoskeletal: equal muscle tone   Data Reviewed: Basic Metabolic Panel:  Recent Labs Lab 03/11/15 1154 03/11/15 2250 03/12/15 0340 03/12/15 1105 03/12/15 2336 03/13/15 0500 03/14/15 0232 03/15/15 0533 03/17/15 0112  NA  --   --  136  --   --  139 139 139 138  K  --   --  3.2*  --   --  4.7 4.4 4.5 3.7  CL  --   --  93*  --   --  98* 96* 96* 98*  CO2  --   --  34*  --   --  33* 33* 31 28  GLUCOSE  --   --  200*  --   --  205* 203* 305* 116*  BUN  --   --  18  --   --  22* 25* 40* 41*  CREATININE  --   --  0.86  --   --  0.89 0.91 1.08* 0.99  CALCIUM  --   --  8.2*  --   --  8.8* 8.8* 9.2 8.2*  MG 2.8* 3.0* 2.7* 2.4 2.4  --   --   --  2.0  PHOS 2.9 3.0  --  3.1 3.6  --   --   --  4.4   Liver Function Tests:  Recent Labs Lab 03/15/15 0533  AST 34  ALT 25  ALKPHOS 109  BILITOT 0.6  PROT 6.2*  ALBUMIN 2.5*   No results for input(s): LIPASE, AMYLASE in the last 168 hours. No results for input(s): AMMONIA in the last 168 hours. CBC:  Recent Labs Lab 03/11/15 0439 03/12/15 0340 03/13/15 0500 03/14/15 0232 03/15/15 0533 03/17/15 0112  WBC 16.0* 19.1* 15.6* 15.9* 13.4* 19.8*  NEUTROABS 14.5* 16.9*  --   --  12.6*  --   HGB 10.7* 11.0* 10.1* 11.0* 11.4* 8.9*  HCT 33.5* 35.0* 32.9* 35.6* 36.7 27.9*  MCV 80.1 80.5 83.1 83.0 80.8 80.4  PLT 261 309 259 311 348 324   Cardiac Enzymes:  Recent Labs Lab 03/15/15 0830 03/15/15 1230 03/16/15 1000 03/16/15 1355 03/16/15 2200  TROPONINI 1.53* 2.71* 4.01* 3.80* 3.63*   BNP (last 3 results)  Recent Labs  10/20/14 1400 02/27/2015 2330  BNP 224.2* 1442.0*    ProBNP (last 3 results) No results for input(s):  PROBNP in the last 8760 hours.  CBG:  Recent Labs Lab 03/16/15 2013 03/17/15 0012 03/17/15 0359 03/17/15 0803 03/17/15 1253  GLUCAP 124* 120* 140* 110* 144*    Recent Results (from the past 240 hour(s))  Culture, blood (routine x 2)     Status: None   Collection Time: 03/10/15 12:34 AM  Result Value Ref Range Status   Specimen Description BLOOD RIGHT ANTECUBITAL  Final   Special Requests   Final    BOTTLES DRAWN AEROBIC AND ANAEROBIC AEB=7CC ANA=5CC   Culture NO GROWTH 5 DAYS  Final   Report Status 03/15/2015 FINAL  Final  Culture, blood (routine x 2)     Status: None   Collection Time: 03/10/15 12:40 AM  Result Value Ref Range Status   Specimen Description BLOOD LEFT WRIST  Final   Special Requests BOTTLES DRAWN AEROBIC AND ANAEROBIC 5CC EACH  Final   Culture NO GROWTH 5 DAYS  Final   Report Status 03/15/2015 FINAL  Final  Urine culture     Status: None   Collection Time: 03/10/15  1:52 AM  Result Value Ref Range Status   Specimen Description URINE, CLEAN CATCH  Final   Special Requests NONE  Final   Colony Count NO GROWTH Performed at [facility]   Final   Culture NO GROWTH Performed at [facility]   Final   Report Status 03/11/2015 FINAL  Final  MRSA PCR Screening     Status: None   Collection Time: 03/10/15  3:28 AM  Result Value Ref Range Status   MRSA by PCR NEGATIVE NEGATIVE Final    Comment:        The GeneXpert MRSA Assay (FDA approved for NASAL specimens only), is one component of a comprehensive MRSA colonization surveillance program. It is not intended to diagnose MRSA infection nor to guide or monitor treatment for MRSA infections.   Culture, respiratory (NON-Expectorated)     Status: None   Collection Time: 03/10/15 10:42 AM  Result Value Ref Range Status   Specimen Description TRACHEAL ASPIRATE  Final   Special Requests Normal  Final   Gram Stain   Final    RARE WBC PRESENT,BOTH PMN AND MONONUCLEAR RARE SQUAMOUS  EPITHELIAL CELLS PRESENT NO ORGANISMS SEEN Performed at [facility]    Culture   Final  NO GROWTH 2 DAYS Performed at [facility]    Report Status 03/12/2015 FINAL  Final  Clostridium Difficile by PCR     Status: None   Collection Time: 03/16/15  7:55 AM  Result Value Ref Range Status   C difficile by pcr NEGATIVE NEGATIVE Final     Studies: Dg Chest Port 1 View  03/17/2015   CLINICAL DATA:  Encounter for central line placement  EXAM: PORTABLE CHEST - 1 VIEW  COMPARISON:  03/15/2015  FINDINGS: Unchanged positioning of right IJ central line, tip at the upper right atrium. Unchanged positioning of left subclavian central line, tip at the upper SVC.  Chronic cardiomegaly. The patient is status post median sternotomy for CABG. Lung volumes are lower after extubation, with diffuse interstitial opacity suggesting pulmonary edema. There could also be superimposed pneumonia. There is a background of chronic lung disease. Probable small pleural effusions. No pneumothorax.  IMPRESSION: 1. No new central line is identified. Pre-existing right IJ and left subclavian central lines have stable orientation compared to 03/15/2015. 2. No pneumothorax. 3. Worsening aeration after extubation. There is chronic lung disease; acute superimposed opacities could reflect pneumonia or edema.   Electronically Signed   By: Monte Fantasia M.D.   On: 03/17/2015 03:32    Scheduled Meds: . aspirin EC  81 mg Oral Daily  . clopidogrel  75 mg Oral Daily  . DULoxetine  90 mg Oral q morning - 10a  . ezetimibe-simvastatin  1 tablet Oral QPM  . furosemide      . furosemide  80 mg Oral Daily  . ice pack  1 each Other Q6H  . insulin aspart  0-15 Units Subcutaneous 6 times per day  . isosorbide mononitrate  30 mg Oral BID  . lactose free nutrition  237 mL Oral TID BM  . levofloxacin (LEVAQUIN) IV  500 mg Intravenous Q24H  . levothyroxine  25 mcg Oral QAC breakfast  . naloxone      . pantoprazole  40 mg  Oral QHS  . potassium chloride  20 mEq Oral Daily   Continuous Infusions: . epoprostenol 32.57 ng/kg/min (03/17/15 0800)  . heparin 800 Units/hr (03/17/15 0800)     Time spent: > 35 minutes    Velvet Bathe  [facility] Pager [phone] If 7PM-7AM, please contact night-coverage at [email or web address], password [facility] 03/17/2015, 2:22 PM  LOS: 7 days

## 2015-03-18 LAB — GLUCOSE, CAPILLARY
GLUCOSE-CAPILLARY: 111 mg/dL — AB (ref 65–99)
GLUCOSE-CAPILLARY: 129 mg/dL — AB (ref 65–99)
Glucose-Capillary: 183 mg/dL — ABNORMAL HIGH (ref 65–99)
Glucose-Capillary: 83 mg/dL (ref 65–99)
Glucose-Capillary: 80 mg/dL (ref 65–99)
Glucose-Capillary: 142 mg/dL — ABNORMAL HIGH (ref 65–99)
Glucose-Capillary: 149 mg/dL — ABNORMAL HIGH (ref 65–99)

## 2015-03-18 LAB — CBC
HCT: 21.1 % — ABNORMAL LOW (ref 36.0–46.0)
HEMATOCRIT: 30 % — AB (ref 36.0–46.0)
Hemoglobin: 6.7 g/dL — CL (ref 12.0–15.0)
Hemoglobin: 9.8 g/dL — ABNORMAL LOW (ref 12.0–15.0)
MCH: 25.7 pg — ABNORMAL LOW (ref 26.0–34.0)
MCH: 26.5 pg (ref 26.0–34.0)
MCHC: 31.8 g/dL (ref 30.0–36.0)
MCHC: 32.7 g/dL (ref 30.0–36.0)
MCV: 80.8 fL (ref 78.0–100.0)
MCV: 81.1 fL (ref 78.0–100.0)
PLATELETS: 273 10*3/uL (ref 150–400)
Platelets: 223 10*3/uL (ref 150–400)
RBC: 2.61 MIL/uL — AB (ref 3.87–5.11)
RBC: 3.7 MIL/uL — ABNORMAL LOW (ref 3.87–5.11)
RDW: 18.3 % — ABNORMAL HIGH (ref 11.5–15.5)
RDW: 16.2 % — ABNORMAL HIGH (ref 11.5–15.5)
WBC: 20.1 10*3/uL — AB (ref 4.0–10.5)
WBC: 17.9 10*3/uL — ABNORMAL HIGH (ref 4.0–10.5)

## 2015-03-18 LAB — CARBOXYHEMOGLOBIN
CARBOXYHEMOGLOBIN: 1.6 % — AB (ref 0.5–1.5)
METHEMOGLOBIN: 1.3 % (ref 0.0–1.5)
O2 SAT: 81.6 %
Total hemoglobin: 7.1 g/dL — ABNORMAL LOW (ref 12.0–16.0)

## 2015-03-18 LAB — HEPARIN LEVEL (UNFRACTIONATED): HEPARIN UNFRACTIONATED: 0.37 [IU]/mL (ref 0.30–0.70)

## 2015-03-18 LAB — PREPARE RBC (CROSSMATCH)

## 2015-03-18 MED ORDER — LEVOFLOXACIN IN D5W 750 MG/150ML IV SOLN
750.0000 mg | INTRAVENOUS | Status: DC
  Administered 2015-03-18: 750 mg via INTRAVENOUS
  Filled 2015-03-18: qty 150

## 2015-03-18 MED ORDER — SODIUM CHLORIDE 0.9 % IV SOLN
Freq: Once | INTRAVENOUS | Status: AC
  Administered 2015-03-18: 09:00:00 via INTRAVENOUS

## 2015-03-18 MED ORDER — ALPRAZOLAM 0.5 MG PO TABS
0.5000 mg | ORAL_TABLET | Freq: Once | ORAL | Status: AC
  Administered 2015-03-18: 0.5 mg via ORAL
  Filled 2015-03-18: qty 1

## 2015-03-18 MED ORDER — SODIUM CHLORIDE 0.9 % IV SOLN
Freq: Once | INTRAVENOUS | Status: AC
  Administered 2015-03-18: 07:00:00 via INTRAVENOUS

## 2015-03-18 MED ORDER — PANTOPRAZOLE SODIUM 40 MG IV SOLR
40.0000 mg | Freq: Two times a day (BID) | INTRAVENOUS | Status: DC
  Administered 2015-03-18 – 2015-03-19 (×3): 40 mg via INTRAVENOUS
  Filled 2015-03-18 (×4): qty 40

## 2015-03-18 MED ORDER — FUROSEMIDE 10 MG/ML IJ SOLN
40.0000 mg | Freq: Two times a day (BID) | INTRAMUSCULAR | Status: AC
  Administered 2015-03-18 (×2): 40 mg via INTRAVENOUS
  Filled 2015-03-18 (×2): qty 4

## 2015-03-18 NOTE — Progress Notes (Addendum)
Advanced Heart Failure Rounding Note   Subjective:    Extubated  03/15/14. Yesterday she had respiratory arrest but improved with bipap. She received narcan and IV lasix. Over night a little better.   + melena. Hgb down to 6.7--. Getting 1URPBC .   This am weak. Feels bad. Respiratory rate in 30s.    Objective:   Weight Range:  Vital Signs:   Temp:  [97.8 F (36.6 C)-98.3 F (36.8 C)] 97.8 F (36.6 C) (05/27 0300) Pulse Rate:  [106-125] 111 (05/27 0600) Resp:  [15-44] 31 (05/27 0600) BP: (58-130)/(25-58) 79/35 mmHg (05/27 0600) SpO2:  [91 %-100 %] 100 % (05/27 0600) FiO2 (%):  [50 %] 50 % (05/26 1704) Weight:  [138 lb 7.2 oz (62.8 kg)] 138 lb 7.2 oz (62.8 kg) (05/27 0300) Last BM Date: 03/17/15  Weight change: Filed Weights   03/16/15 0500 03/17/15 0500 03/18/15 0300  Weight: 144 lb 6.4 oz (65.5 kg) 139 lb (63.05 kg) 138 lb 7.2 oz (62.8 kg)    Intake/Output:   Intake/Output Summary (Last 24 hours) at 03/18/15 0734 Last data filed at 03/18/15 0500  Gross per 24 hour  Intake 654.84 ml  Output   1150 ml  Net -495.16 ml     Physical Exam: General: Fatigues appearing. Tachypneic HEENT: erythematous Neck: thick . JVP up Carotids 2+ bilat; no bruits. No lymphadenopathy or thryomegaly appreciated.  Cor: PMI nonpalpable Regular rate & rhythm. 2/6 AS radiates to bilateral carotids.  Hickman catheter ok Lungs: tachypneic. rhonchorous Abdomen: obese. soft, nontender, nondistended. No hepatosplenomegaly. No bruits or masses. Good bowel sounds. Extremities: no cyanosis, + clubbing, macular-papular erythematous rash on LEs, trace edema  Neuro:moves all 4 without difficulty  Telemetry: Sinus tach   Labs: Basic Metabolic Panel:  Recent Labs Lab 03/11/15 1154 03/11/15 2250  03/12/15 0340 03/12/15 1105 03/12/15 2336 03/13/15 0500 03/14/15 0232 03/15/15 0533 03/17/15 0112  NA  --   --   --  136  --   --  139 139 139 138  K  --   --   --  3.2*  --   --  4.7 4.4  4.5 3.7  CL  --   --   --  93*  --   --  98* 96* 96* 98*  CO2  --   --   --  34*  --   --  33* 33* 31 28  GLUCOSE  --   --   --  200*  --   --  205* 203* 305* 116*  BUN  --   --   --  18  --   --  22* 25* 40* 41*  CREATININE  --   --   --  0.86  --   --  0.89 0.91 1.08* 0.99  CALCIUM  --   --   < > 8.2*  --   --  8.8* 8.8* 9.2 8.2*  MG 2.8* 3.0*  --  2.7* 2.4 2.4  --   --   --  2.0  PHOS 2.9 3.0  --   --  3.1 3.6  --   --   --  4.4  < > = values in this interval not displayed.  Liver Function Tests:  Recent Labs Lab 03/15/15 0533  AST 34  ALT 25  ALKPHOS 109  BILITOT 0.6  PROT 6.2*  ALBUMIN 2.5*   No results for input(s): LIPASE, AMYLASE in the last 168 hours.  Recent Labs Lab 03/17/15 1724  AMMONIA 21    CBC:  Recent Labs Lab 03/12/15 0340 03/13/15 0500 03/14/15 0232 03/15/15 0533 03/17/15 0112 03/18/15 0441  WBC 19.1* 15.6* 15.9* 13.4* 19.8* 20.1*  NEUTROABS 16.9*  --   --  12.6*  --   --   HGB 11.0* 10.1* 11.0* 11.4* 8.9* 6.7*  HCT 35.0* 32.9* 35.6* 36.7 27.9* 21.1*  MCV 80.5 83.1 83.0 80.8 80.4 80.8  PLT 309 259 311 348 324 273    Cardiac Enzymes:  Recent Labs Lab 03/15/15 0830 03/15/15 1230 03/16/15 1000 03/16/15 1355 03/16/15 2200  TROPONINI 1.53* 2.71* 4.01* 3.80* 3.63*    BNP: BNP (last 3 results)  Recent Labs  10/20/14 1400 03/08/2015 2330  BNP 224.2* 1442.0*    ProBNP (last 3 results) No results for input(s): PROBNP in the last 8760 hours.    Other results:  Imaging: Dg Chest Port 1 View  03/17/2015   CLINICAL DATA:  Respiratory distress for 1 day.  EXAM: PORTABLE CHEST - 1 VIEW  COMPARISON:  03/17/2015  FINDINGS: Cardiomegaly and CABG changes noted.  A right IJ central venous catheter with tip overlying the superior cavoatrial junction and left subclavian central venous catheter with tip overlying the upper SVC/azygos region again noted.  Bilateral opacities and lower lung atelectasis again noted.  There is no evidence of  pneumothorax.  IMPRESSION: Unchanged chest radiograph with bilateral opacities-question edema versus pneumonia. Continued bilateral lower lung atelectasis.   Electronically Signed   By: Margarette Canada M.D.   On: 03/17/2015 17:34   Dg Chest Port 1 View  03/17/2015   CLINICAL DATA:  Encounter for central line placement  EXAM: PORTABLE CHEST - 1 VIEW  COMPARISON:  03/15/2015  FINDINGS: Unchanged positioning of right IJ central line, tip at the upper right atrium. Unchanged positioning of left subclavian central line, tip at the upper SVC.  Chronic cardiomegaly. The patient is status post median sternotomy for CABG. Lung volumes are lower after extubation, with diffuse interstitial opacity suggesting pulmonary edema. There could also be superimposed pneumonia. There is a background of chronic lung disease. Probable small pleural effusions. No pneumothorax.  IMPRESSION: 1. No new central line is identified. Pre-existing right IJ and left subclavian central lines have stable orientation compared to 03/15/2015. 2. No pneumothorax. 3. Worsening aeration after extubation. There is chronic lung disease; acute superimposed opacities could reflect pneumonia or edema.   Electronically Signed   By: Monte Fantasia M.D.   On: 03/17/2015 03:32     Medications:     Scheduled Medications: . aspirin EC  81 mg Oral Daily  . clopidogrel  75 mg Oral Daily  . DULoxetine  90 mg Oral q morning - 10a  . ezetimibe-simvastatin  1 tablet Oral QPM  . furosemide  80 mg Oral Daily  . ice pack  1 each Other Q6H  . insulin aspart  0-15 Units Subcutaneous 6 times per day  . isosorbide mononitrate  30 mg Oral BID  . lactose free nutrition  237 mL Oral TID BM  . levothyroxine  25 mcg Oral QAC breakfast  . oxymetazoline  2 spray Left Nare BID  . pantoprazole  40 mg Oral QHS  . potassium chloride  20 mEq Oral Daily    Infusions: . epoprostenol 32.57 ng/kg/min (03/17/15 0800)  . heparin 800 Units/hr (03/18/15 0054)    PRN  Medications: sodium chloride, albuterol, diphenhydrAMINE, hydrALAZINE, HYDROmorphone, ondansetron (ZOFRAN) IV   Assessment:   1. Respiratory arrest 2. Acute on chronic respiratory failure -  likely mixed picture 3. Acute on chronic diastolic HF 4. Pulmonary HTN on Flolan 5. CAD s/p CABG 6. HCAP 7. Hypomagensemia/hypokalemia 8. Hypertension  9. Elevated troponin - likely due to demand ischemia 10. Anemia- Hgb 5.7  11. DNR   Plan/Discussion:      Length of Stay: 8 CLEGG,AMY NP-C  03/18/2015, 7:34 AM Advanced Heart Failure Team Pager [phone] (M-F; [place])  Please contact [facility] for night-coverage after hours (4p -7a ) and weekends on [email or web address]  Patient seen and examined with Darrick Grinder, NP. We discussed all aspects of the encounter. I agree with the assessment and plan as stated above.   She remains very tenuous from a respiratory perspective. Agree with blood transfusion. Start IV protonix. Will increase lasix. Support with Bipap as needed. I, unfortuntaely, do not think she will survive this hospitalization. Confirmed DNR and no re-intubation status.   D/w D. Vega at bedside.   Bensimhon, Daniel,MD 10:54 AM

## 2015-03-18 NOTE — Progress Notes (Signed)
PT Cancellation Note  Patient Details Name: Meagan [name] MRN: [id number] DOB: 10-27-1945   Cancelled Treatment:    Reason Eval/Treat Not Completed: Medical issues which prohibited therapy.  Events of yesterday noted.  Patient on NRB.  Spoke with RN.  Will cancel PT session today.  Will return at later time for PT as appropriate for patient.   Despina Pole 03/18/2015, 10:33 AM Carita Pian. Sanjuana Kava, [place] Pager [phone]

## 2015-03-18 NOTE — Progress Notes (Signed)
ANTIBIOTIC CONSULT NOTE - FOLLOW UP  Pharmacy Consult for Levofloxacin Indication: rule out pneumonia  Allergies  Allergen Reactions  . Phenergan Fortis [Promethazine] Other (See Comments)    Family and pt states becomes very confused and wild.   . Codeine Other (See Comments)    Messes with digestive system  . Niacin Other (See Comments)    Skin tingling symptoms  . Nsaids Other (See Comments)    REACTION: Aggravates Digestive System   . Sulfonamide Derivatives Other (See Comments)    Messes with digestive system  . Aspirin Other (See Comments)    Messes with digestive system, possibly only has reaction to 325mg  dose, currently takes ASA 81mg   . Zosyn [Piperacillin Sod-Tazobactam So] Rash    Diffuse erythematous rash     Patient Measurements: Height: 5' (152.4 cm) Weight: 138 lb 7.2 oz (62.8 kg) IBW/kg (Calculated) : 45.5  Vital Signs: Temp: 98.8 F (37.1 C) (05/27 1245) Temp Source: Oral (05/27 1245) BP: 112/47 mmHg (05/27 1245) Pulse Rate: 113 (05/27 1245) Intake/Output from previous day: 05/26 0701 - 05/27 0700 In: 654.8 [P.O.:400; I.V.:254.8] Out: 1150 [Urine:1150] Intake/Output from this shift: Total I/O In: 364.6 [I.V.:364.6] Out: 650 [Urine:650]  Labs:  Recent Labs  03/17/15 0112 03/18/15 0441  WBC 19.8* 20.1*  HGB 8.9* 6.7*  PLT 324 273  CREATININE 0.99  --    Estimated Creatinine Clearance: 43.7 mL/min (by C-G formula based on Cr of 0.99). No results for input(s): VANCOTROUGH, VANCOPEAK, VANCORANDOM, GENTTROUGH, GENTPEAK, GENTRANDOM, TOBRATROUGH, TOBRAPEAK, TOBRARND, AMIKACINPEAK, AMIKACINTROU, AMIKACIN in the last 72 hours.   Microbiology: Recent Results (from the past 720 hour(s))  Culture, blood (routine x 2)     Status: None   Collection Time: 03/10/15 12:34 AM  Result Value Ref Range Status   Specimen Description BLOOD RIGHT ANTECUBITAL  Final   Special Requests   Final    BOTTLES DRAWN AEROBIC AND ANAEROBIC AEB=7CC ANA=5CC   Culture NO  GROWTH 5 DAYS  Final   Report Status 03/15/2015 FINAL  Final  Culture, blood (routine x 2)     Status: None   Collection Time: 03/10/15 12:40 AM  Result Value Ref Range Status   Specimen Description BLOOD LEFT WRIST  Final   Special Requests BOTTLES DRAWN AEROBIC AND ANAEROBIC 5CC EACH  Final   Culture NO GROWTH 5 DAYS  Final   Report Status 03/15/2015 FINAL  Final  Urine culture     Status: None   Collection Time: 03/10/15  1:52 AM  Result Value Ref Range Status   Specimen Description URINE, CLEAN CATCH  Final   Special Requests NONE  Final   Colony Count NO GROWTH Performed at [facility]   Final   Culture NO GROWTH Performed at [facility]   Final   Report Status 03/11/2015 FINAL  Final  MRSA PCR Screening     Status: None   Collection Time: 03/10/15  3:28 AM  Result Value Ref Range Status   MRSA by PCR NEGATIVE NEGATIVE Final    Comment:        The GeneXpert MRSA Assay (FDA approved for NASAL specimens only), is one component of a comprehensive MRSA colonization surveillance program. It is not intended to diagnose MRSA infection nor to guide or monitor treatment for MRSA infections.   Culture, respiratory (NON-Expectorated)     Status: None   Collection Time: 03/10/15 10:42 AM  Result Value Ref Range Status   Specimen Description TRACHEAL ASPIRATE  Final  Special Requests Normal  Final   Gram Stain   Final    RARE WBC PRESENT,BOTH PMN AND MONONUCLEAR RARE SQUAMOUS EPITHELIAL CELLS PRESENT NO ORGANISMS SEEN Performed at [facility]    Culture   Final    NO GROWTH 2 DAYS Performed at [facility]    Report Status 03/12/2015 FINAL  Final  Clostridium Difficile by PCR     Status: None   Collection Time: 03/16/15  7:55 AM  Result Value Ref Range Status   C difficile by pcr NEGATIVE NEGATIVE Final    Anti-infectives    Start     Dose/Rate Route Frequency Ordered Stop   03/18/15 1600  levofloxacin (LEVAQUIN) IVPB 750 mg      750 mg 100 mL/hr over 90 Minutes Intravenous Every 48 hours 03/18/15 1236     03/12/15 1115  levofloxacin (LEVAQUIN) IVPB 500 mg     500 mg 100 mL/hr over 60 Minutes Intravenous Every 24 hours 03/12/15 1111 03/17/15 1647   03/11/15 0000  vancomycin (VANCOCIN) IVPB 1000 mg/200 mL premix  Status:  Discontinued     1,000 mg 200 mL/hr over 60 Minutes Intravenous Every 24 hours 03/10/15 0405 03/11/15 1811   03/10/15 0600  piperacillin-tazobactam (ZOSYN) IVPB 3.375 g  Status:  Discontinued     3.375 g 12.5 mL/hr over 240 Minutes Intravenous 3 times per day 03/10/15 0405 03/12/15 1111   03/10/15 0015  vancomycin (VANCOCIN) IVPB 1000 mg/200 mL premix     1,000 mg 200 mL/hr over 60 Minutes Intravenous  Once 03/10/15 0014 03/10/15 0209   03/10/15 0015  piperacillin-tazobactam (ZOSYN) IVPB 3.375 g     3.375 g 100 mL/hr over 30 Minutes Intravenous  Once 03/10/15 0014 03/10/15 0110      Assessment: 69 y.o female presented to ED on 5/18 PM complaining of SOB with onset 3 days ago, worsening.    PMH (extensive, abbrv): PAH on Flolan, dHF, HLD, HTN, CRI, CAD s/p DES x2 (2007), Hx MRSA PNA, pancreatic mass, Hypothyroidism, DM2, GERD, Anxiety  ID: Pneumonia.  Hx MRSA PNA.  Developed rash on 5/20 that worsened as day progressed - abx changed, getting Benadryl (face red - chest/under breast) >> labelled as Zosyn allergy. Afebrile, WBC 19.1>15.6>15.9.  LA 0.8 >1.2>0.5. PCT 0.21>0.16.  Vanc 5/19 >> 5/20 Zosyn 5/19 >> 5/21 LVQ 5/21>>, therapy to continue.  5/19 BCx x2: NGTD 5/19 UCx: neg 5/19 Resp: Neg   Goal of Therapy:  Renal adjustment of medications  Plan:  Levofloxacin 750 mg IV q48h Follow up SCr, UOP, cultures, clinical course and adjust as clinically indicated.  Thank you for allowing pharmacy to be a part of this patients care team.  Rowe Robert Pharm.D., BCPS, AQ-Cardiology Clinical Pharmacist 03/18/2015 1:34 PM Pager: [phone] Phone: [phone]

## 2015-03-18 NOTE — Progress Notes (Signed)
[facility] PROGRESS NOTE  KORTNEE BAS AOZ:[id number] DOB: [birth year] DOA: 03/08/2015 PCP: Glo Herring., MD   From original HPI: 69 y.o. F with severe PH (on Flolan) brought to [facility] ED 5/19 with SOB. Found to have HCAP and AoC hypercarbic respiratory failure. Started on BiPAP and transferred to [facility] for further evaluation and management.   Code blue was called due to hypoxic episode. This was canceled as patient is DNR. Reportedly patient had recently received dilauded and narcan was administered of which patient became more alert. Cardiology also evaluated patient and plans were put in place to add IV lasix. Pt is currently sitting up in bed with Bipap in place.  Assessment/Plan: Active Problems:   Sepsis - Patient has been treated with 8 days of antibiotic therapy. First 3 days were treated with Vancomycin and Zosyn - Will continue levaquin at this juncture  Elevated troponin - Per prior notes either demand ischemia vs NSTEMI, agree with holding heparin in lieu of worsening anemia and possible GI bleed. - Cardiology on board and assisting.  Anemia - Worsened when compared to last. Will transfuse 2 units slowly and will plan for diuresis in between units with 40 mg IV lasix (d/c with cardiology)  - Could be from GI source as nursing reports start stools. Heparin has been discontinued - Given cardiac history would prefer to maintain hemoglobin levels above the 8.0    Acute respiratory failure - If patient starts to breath at 40 breaths per minute will attempt bipap again and if no improvement will discontinue bipap and place on morphine for palliative purposes.    Acute pulmonary edema - Cardiology on board and assisting with management - Placed order for foley catheter insertion - agree with lasix administration - monitor intake and output    Pneumonia, organism unspecified - chest x ray reporting BL opacities question edema vs pna - Will place order to continue  levaquin  Code Status: DNR Family Communication: discussed with sister at bedside Disposition Plan: Pending improvement in respiratory condition   Consultants:  Cardiology  PCCM  Procedures:  None  Antibiotics:  levaquin  HPI/Subjective: Patient is currently on Bipap  Objective: Filed Vitals:   03/18/15 1045  BP: 92/55  Pulse: 113  Temp: 98.4 F (36.9 C)  Resp: 31    Intake/Output Summary (Last 24 hours) at 03/18/15 1157 Last data filed at 03/18/15 1045  Gross per 24 hour  Intake 707.84 ml  Output   1250 ml  Net -542.16 ml   Filed Weights   03/16/15 0500 03/17/15 0500 03/18/15 0300  Weight: 65.5 kg (144 lb 6.4 oz) 63.05 kg (139 lb) 62.8 kg (138 lb 7.2 oz)    Exam:   General:  Pt on bipap, sitting up in bed arrousable  Cardiovascular: rrr, no rubs  Respiratory: decreased breath sounds at left lung base, no wheezes, + rhales  Abdomen: soft, nd, nt  Musculoskeletal: equal muscle tone   Data Reviewed: Basic Metabolic Panel:  Recent Labs Lab 03/11/15 2250 03/12/15 0340 03/12/15 1105 03/12/15 2336 03/13/15 0500 03/14/15 0232 03/15/15 0533 03/17/15 0112  NA  --  136  --   --  139 139 139 138  K  --  3.2*  --   --  4.7 4.4 4.5 3.7  CL  --  93*  --   --  98* 96* 96* 98*  CO2  --  34*  --   --  33* 33* 31 28  GLUCOSE  --  200*  --   --  205* 203* 305* 116*  BUN  --  18  --   --  22* 25* 40* 41*  CREATININE  --  0.86  --   --  0.89 0.91 1.08* 0.99  CALCIUM  --  8.2*  --   --  8.8* 8.8* 9.2 8.2*  MG 3.0* 2.7* 2.4 2.4  --   --   --  2.0  PHOS 3.0  --  3.1 3.6  --   --   --  4.4   Liver Function Tests:  Recent Labs Lab 03/15/15 0533  AST 34  ALT 25  ALKPHOS 109  BILITOT 0.6  PROT 6.2*  ALBUMIN 2.5*   No results for input(s): LIPASE, AMYLASE in the last 168 hours.  Recent Labs Lab 03/17/15 1724  AMMONIA 21   CBC:  Recent Labs Lab 03/12/15 0340 03/13/15 0500 03/14/15 0232 03/15/15 0533 03/17/15 0112 03/18/15 0441  WBC  19.1* 15.6* 15.9* 13.4* 19.8* 20.1*  NEUTROABS 16.9*  --   --  12.6*  --   --   HGB 11.0* 10.1* 11.0* 11.4* 8.9* 6.7*  HCT 35.0* 32.9* 35.6* 36.7 27.9* 21.1*  MCV 80.5 83.1 83.0 80.8 80.4 80.8  PLT 309 259 311 348 324 273   Cardiac Enzymes:  Recent Labs Lab 03/15/15 0830 03/15/15 1230 03/16/15 1000 03/16/15 1355 03/16/15 2200  TROPONINI 1.53* 2.71* 4.01* 3.80* 3.63*   BNP (last 3 results)  Recent Labs  10/20/14 1400 03/02/2015 2330  BNP 224.2* 1442.0*    ProBNP (last 3 results) No results for input(s): PROBNP in the last 8760 hours.  CBG:  Recent Labs Lab 03/17/15 1705 03/17/15 2000 03/18/15 0008 03/18/15 0405 03/18/15 0803  GLUCAP 183* 164* 111* 129* 83    Recent Results (from the past 240 hour(s))  Culture, blood (routine x 2)     Status: None   Collection Time: 03/10/15 12:34 AM  Result Value Ref Range Status   Specimen Description BLOOD RIGHT ANTECUBITAL  Final   Special Requests   Final    BOTTLES DRAWN AEROBIC AND ANAEROBIC AEB=7CC ANA=5CC   Culture NO GROWTH 5 DAYS  Final   Report Status 03/15/2015 FINAL  Final  Culture, blood (routine x 2)     Status: None   Collection Time: 03/10/15 12:40 AM  Result Value Ref Range Status   Specimen Description BLOOD LEFT WRIST  Final   Special Requests BOTTLES DRAWN AEROBIC AND ANAEROBIC 5CC EACH  Final   Culture NO GROWTH 5 DAYS  Final   Report Status 03/15/2015 FINAL  Final  Urine culture     Status: None   Collection Time: 03/10/15  1:52 AM  Result Value Ref Range Status   Specimen Description URINE, CLEAN CATCH  Final   Special Requests NONE  Final   Colony Count NO GROWTH Performed at [facility]   Final   Culture NO GROWTH Performed at [facility]   Final   Report Status 03/11/2015 FINAL  Final  MRSA PCR Screening     Status: None   Collection Time: 03/10/15  3:28 AM  Result Value Ref Range Status   MRSA by PCR NEGATIVE NEGATIVE Final    Comment:        The GeneXpert MRSA Assay  (FDA approved for NASAL specimens only), is one component of a comprehensive MRSA colonization surveillance program. It is not intended to diagnose MRSA infection nor to guide or monitor treatment for MRSA infections.   Culture, respiratory (NON-Expectorated)  Status: None   Collection Time: 03/10/15 10:42 AM  Result Value Ref Range Status   Specimen Description TRACHEAL ASPIRATE  Final   Special Requests Normal  Final   Gram Stain   Final    RARE WBC PRESENT,BOTH PMN AND MONONUCLEAR RARE SQUAMOUS EPITHELIAL CELLS PRESENT NO ORGANISMS SEEN Performed at [facility]    Culture   Final    NO GROWTH 2 DAYS Performed at [facility]    Report Status 03/12/2015 FINAL  Final  Clostridium Difficile by PCR     Status: None   Collection Time: 03/16/15  7:55 AM  Result Value Ref Range Status   C difficile by pcr NEGATIVE NEGATIVE Final     Studies: Dg Chest Port 1 View  03/17/2015   CLINICAL DATA:  Respiratory distress for 1 day.  EXAM: PORTABLE CHEST - 1 VIEW  COMPARISON:  03/17/2015  FINDINGS: Cardiomegaly and CABG changes noted.  A right IJ central venous catheter with tip overlying the superior cavoatrial junction and left subclavian central venous catheter with tip overlying the upper SVC/azygos region again noted.  Bilateral opacities and lower lung atelectasis again noted.  There is no evidence of pneumothorax.  IMPRESSION: Unchanged chest radiograph with bilateral opacities-question edema versus pneumonia. Continued bilateral lower lung atelectasis.   Electronically Signed   By: Margarette Canada M.D.   On: 03/17/2015 17:34   Dg Chest Port 1 View  03/17/2015   CLINICAL DATA:  Encounter for central line placement  EXAM: PORTABLE CHEST - 1 VIEW  COMPARISON:  03/15/2015  FINDINGS: Unchanged positioning of right IJ central line, tip at the upper right atrium. Unchanged positioning of left subclavian central line, tip at the upper SVC.  Chronic cardiomegaly. The patient is  status post median sternotomy for CABG. Lung volumes are lower after extubation, with diffuse interstitial opacity suggesting pulmonary edema. There could also be superimposed pneumonia. There is a background of chronic lung disease. Probable small pleural effusions. No pneumothorax.  IMPRESSION: 1. No new central line is identified. Pre-existing right IJ and left subclavian central lines have stable orientation compared to 03/15/2015. 2. No pneumothorax. 3. Worsening aeration after extubation. There is chronic lung disease; acute superimposed opacities could reflect pneumonia or edema.   Electronically Signed   By: Monte Fantasia M.D.   On: 03/17/2015 03:32    Scheduled Meds: . aspirin EC  81 mg Oral Daily  . clopidogrel  75 mg Oral Daily  . DULoxetine  90 mg Oral q morning - 10a  . ezetimibe-simvastatin  1 tablet Oral QPM  . furosemide  40 mg Intravenous BID  . ice pack  1 each Other Q6H  . insulin aspart  0-15 Units Subcutaneous 6 times per day  . isosorbide mononitrate  30 mg Oral BID  . lactose free nutrition  237 mL Oral TID BM  . levothyroxine  25 mcg Oral QAC breakfast  . oxymetazoline  2 spray Left Nare BID  . pantoprazole (PROTONIX) IV  40 mg Intravenous Q12H  . potassium chloride  20 mEq Oral Daily   Continuous Infusions: . epoprostenol 32.57 ng/kg/min (03/17/15 0800)     Time spent: > 35 minutes    Velvet Bathe  [facility] Pager [phone] If 7PM-7AM, please contact night-coverage at [email or web address], password [facility] 03/18/2015, 11:57 AM  LOS: 8 days

## 2015-03-18 NOTE — Progress Notes (Addendum)
Advanced Heart Failure Rounding Note   Subjective:    Extubated yesterday.  While patient getting her linens changed in bed became apneic, hypoxic and unresponsive.   I responded to bedside quickly with Dr. Wendee Beavers this afternoon. She was minimally responsive. Placed on NRB. Given 0.4 narcan to reverse recently given po dilaudid. Also given 80 IV lasix. Placed on BiPap due to RR >40. Began to recover slowly.   Objective:   Weight Range:  Vital Signs:   Temp:  [98 F (36.7 C)-98.3 F (36.8 C)] 98.2 F (36.8 C) (05/26 1900) Pulse Rate:  [106-125] 112 (05/26 2353) Resp:  [15-44] 25 (05/26 2353) BP: (78-125)/(25-53) 78/44 mmHg (05/26 2353) SpO2:  [91 %-100 %] 100 % (05/26 2353) FiO2 (%):  [50 %] 50 % (05/26 1704) Weight:  [63.05 kg (139 lb)] 63.05 kg (139 lb) (05/26 0500) Last BM Date: 03/17/15  Weight change: Filed Weights   03/15/15 0436 03/16/15 0500 03/17/15 0500  Weight: 67.6 kg (149 lb 0.5 oz) 65.5 kg (144 lb 6.4 oz) 63.05 kg (139 lb)    Intake/Output:   Intake/Output Summary (Last 24 hours) at 03/18/15 0007 Last data filed at 03/17/15 2200  Gross per 24 hour  Intake 967.76 ml  Output    625 ml  Net 342.76 ml     Physical Exam: General: minimally arousable. tachypneig. HEENT: erythematous Neck: thick . Carotids 2+ bilat; no bruits. No lymphadenopathy or thryomegaly appreciated.  Cor: PMI nonpalpable Regular rate & rhythm. 2/6 AS radiates to bilateral carotids.  Hickman catheter ok Lungs: tachypneic. rhonchorous Abdomen: obese. soft, nontender, nondistended. No hepatosplenomegaly. No bruits or masses. Good bowel sounds.  Extremities: no cyanosis, + clubbing, macular-papular erythematous rash on LEs, trace edema  Neuro:moves all 4 without difficulty  Telemetry: Sinus tach   Labs: Basic Metabolic Panel:  Recent Labs Lab 03/11/15 1154 03/11/15 2250 03/12/15 0340 03/12/15 1105 03/12/15 2336 03/13/15 0500 03/14/15 0232 03/15/15 0533 03/17/15 0112   NA  --   --  136  --   --  139 139 139 138  K  --   --  3.2*  --   --  4.7 4.4 4.5 3.7  CL  --   --  93*  --   --  98* 96* 96* 98*  CO2  --   --  34*  --   --  33* 33* 31 28  GLUCOSE  --   --  200*  --   --  205* 203* 305* 116*  BUN  --   --  18  --   --  22* 25* 40* 41*  CREATININE  --   --  0.86  --   --  0.89 0.91 1.08* 0.99  CALCIUM  --   --  8.2*  --   --  8.8* 8.8* 9.2 8.2*  MG 2.8* 3.0* 2.7* 2.4 2.4  --   --   --  2.0  PHOS 2.9 3.0  --  3.1 3.6  --   --   --  4.4    Liver Function Tests:  Recent Labs Lab 03/15/15 0533  AST 34  ALT 25  ALKPHOS 109  BILITOT 0.6  PROT 6.2*  ALBUMIN 2.5*   No results for input(s): LIPASE, AMYLASE in the last 168 hours.  Recent Labs Lab 03/17/15 1724  AMMONIA 21    CBC:  Recent Labs Lab 03/11/15 0439 03/12/15 0340 03/13/15 0500 03/14/15 0232 03/15/15 0533 03/17/15 0112  WBC 16.0* 19.1* 15.6* 15.9*  13.4* 19.8*  NEUTROABS 14.5* 16.9*  --   --  12.6*  --   HGB 10.7* 11.0* 10.1* 11.0* 11.4* 8.9*  HCT 33.5* 35.0* 32.9* 35.6* 36.7 27.9*  MCV 80.1 80.5 83.1 83.0 80.8 80.4  PLT 261 309 259 311 348 324    Cardiac Enzymes:  Recent Labs Lab 03/15/15 0830 03/15/15 1230 03/16/15 1000 03/16/15 1355 03/16/15 2200  TROPONINI 1.53* 2.71* 4.01* 3.80* 3.63*    BNP: BNP (last 3 results)  Recent Labs  10/20/14 1400 03/06/2015 2330  BNP 224.2* 1442.0*    ProBNP (last 3 results) No results for input(s): PROBNP in the last 8760 hours.    Other results:  Imaging: Dg Chest Port 1 View  03/17/2015   CLINICAL DATA:  Respiratory distress for 1 day.  EXAM: PORTABLE CHEST - 1 VIEW  COMPARISON:  03/17/2015  FINDINGS: Cardiomegaly and CABG changes noted.  A right IJ central venous catheter with tip overlying the superior cavoatrial junction and left subclavian central venous catheter with tip overlying the upper SVC/azygos region again noted.  Bilateral opacities and lower lung atelectasis again noted.  There is no evidence of  pneumothorax.  IMPRESSION: Unchanged chest radiograph with bilateral opacities-question edema versus pneumonia. Continued bilateral lower lung atelectasis.   Electronically Signed   By: Margarette Canada M.D.   On: 03/17/2015 17:34   Dg Chest Port 1 View  03/17/2015   CLINICAL DATA:  Encounter for central line placement  EXAM: PORTABLE CHEST - 1 VIEW  COMPARISON:  03/15/2015  FINDINGS: Unchanged positioning of right IJ central line, tip at the upper right atrium. Unchanged positioning of left subclavian central line, tip at the upper SVC.  Chronic cardiomegaly. The patient is status post median sternotomy for CABG. Lung volumes are lower after extubation, with diffuse interstitial opacity suggesting pulmonary edema. There could also be superimposed pneumonia. There is a background of chronic lung disease. Probable small pleural effusions. No pneumothorax.  IMPRESSION: 1. No new central line is identified. Pre-existing right IJ and left subclavian central lines have stable orientation compared to 03/15/2015. 2. No pneumothorax. 3. Worsening aeration after extubation. There is chronic lung disease; acute superimposed opacities could reflect pneumonia or edema.   Electronically Signed   By: Monte Fantasia M.D.   On: 03/17/2015 03:32     Medications:     Scheduled Medications: . aspirin EC  81 mg Oral Daily  . clopidogrel  75 mg Oral Daily  . DULoxetine  90 mg Oral q morning - 10a  . ezetimibe-simvastatin  1 tablet Oral QPM  . furosemide      . furosemide  80 mg Oral Daily  . ice pack  1 each Other Q6H  . insulin aspart  0-15 Units Subcutaneous 6 times per day  . isosorbide mononitrate  30 mg Oral BID  . lactose free nutrition  237 mL Oral TID BM  . levothyroxine  25 mcg Oral QAC breakfast  . oxymetazoline  2 spray Left Nare BID  . pantoprazole  40 mg Oral QHS  . potassium chloride  20 mEq Oral Daily    Infusions: . epoprostenol 32.57 ng/kg/min (03/17/15 0800)  . heparin 800 Units/hr (03/17/15  0800)    PRN Medications: sodium chloride, albuterol, diphenhydrAMINE, hydrALAZINE, HYDROmorphone, ondansetron (ZOFRAN) IV   Assessment:   1. Respiratory arrest 2. Acute on chronic respiratory failure - likely mixed picture 3. Acute on chronic diastolic HF 4. Pulmonary HTN on Flolan 5. CAD s/p CABG 6. HCAP 7. Hypomagensemia/hypokalemia 8.  Hypertension  9. Elevated troponin - likely due to demand ischemia  Plan/Discussion:    She had a respiratory arrest this afternon. Suspect a combination of pain meds and hypoxia.Also treated with IV lasix and BiPap. Suspect she will need BiPAP for a while. iWll keep dry. She is DNR.    Long talk with family about gravity of situation.  The patient is critically ill with multiple organ systems failure and requires high complexity decision making for assessment and support, frequent evaluation and titration of therapies, application of advanced monitoring technologies and extensive interpretation of multiple databases.   Critical Care Time devoted to patient care services described in this note is 35 Minutes.    Length of Stay: 8 Dafney Farler MD 03/18/2015, 12:07 AM Advanced Heart Failure Team Pager [phone] (M-F; [place])  Please contact [facility] for night-coverage after hours (4p -7a ) and weekends on [email or web address]

## 2015-03-19 DIAGNOSIS — R6520 Severe sepsis without septic shock: Secondary | ICD-10-CM

## 2015-03-19 DIAGNOSIS — A419 Sepsis, unspecified organism: Principal | ICD-10-CM

## 2015-03-19 DIAGNOSIS — Z66 Do not resuscitate: Secondary | ICD-10-CM

## 2015-03-19 DIAGNOSIS — Z515 Encounter for palliative care: Secondary | ICD-10-CM

## 2015-03-19 LAB — TYPE AND SCREEN
ABO/RH(D): O POS
ANTIBODY SCREEN: NEGATIVE
Unit division: 0
Unit division: 0

## 2015-03-19 LAB — CBC
HCT: 28.7 % — ABNORMAL LOW (ref 36.0–46.0)
Hemoglobin: 9.4 g/dL — ABNORMAL LOW (ref 12.0–15.0)
MCH: 26.5 pg (ref 26.0–34.0)
MCHC: 32.8 g/dL (ref 30.0–36.0)
MCV: 80.8 fL (ref 78.0–100.0)
Platelets: 214 10*3/uL (ref 150–400)
RBC: 3.55 MIL/uL — ABNORMAL LOW (ref 3.87–5.11)
RDW: 16.2 % — ABNORMAL HIGH (ref 11.5–15.5)
WBC: 18.3 10*3/uL — AB (ref 4.0–10.5)

## 2015-03-19 LAB — CARBOXYHEMOGLOBIN
Carboxyhemoglobin: 1.1 % (ref 0.5–1.5)
Methemoglobin: 1.1 % (ref 0.0–1.5)
O2 Saturation: 94.6 %
Total hemoglobin: 9.8 g/dL — ABNORMAL LOW (ref 12.0–16.0)

## 2015-03-19 LAB — GLUCOSE, CAPILLARY
GLUCOSE-CAPILLARY: 101 mg/dL — AB (ref 65–99)
GLUCOSE-CAPILLARY: 102 mg/dL — AB (ref 65–99)
GLUCOSE-CAPILLARY: 103 mg/dL — AB (ref 65–99)

## 2015-03-19 LAB — BASIC METABOLIC PANEL
Anion gap: 10 (ref 5–15)
BUN: 24 mg/dL — ABNORMAL HIGH (ref 6–20)
CHLORIDE: 95 mmol/L — AB (ref 101–111)
CO2: 30 mmol/L (ref 22–32)
Calcium: 8.3 mg/dL — ABNORMAL LOW (ref 8.9–10.3)
Creatinine, Ser: 0.86 mg/dL (ref 0.44–1.00)
GFR calc Af Amer: >60 mL/min (ref >60)
GFR calc non Af Amer: >60 mL/min (ref >60)
Glucose, Bld: 100 mg/dL — ABNORMAL HIGH (ref 65–99)
POTASSIUM: 2.6 mmol/L — AB (ref 3.5–5.1)
SODIUM: 135 mmol/L (ref 135–145)

## 2015-03-19 MED ORDER — POTASSIUM CHLORIDE 10 MEQ/100ML IV SOLN
10.0000 meq | INTRAVENOUS | Status: AC
  Administered 2015-03-19 (×3): 10 meq via INTRAVENOUS
  Filled 2015-03-19 (×2): qty 100

## 2015-03-19 MED ORDER — MORPHINE BOLUS VIA INFUSION
2.0000 mg | INTRAVENOUS | Status: DC | PRN
  Filled 2015-03-19: qty 2

## 2015-03-19 MED ORDER — ALPRAZOLAM 0.25 MG PO TABS: 0.2500 mg | ORAL_TABLET | Freq: Three times a day (TID) | ORAL | Status: DC | PRN

## 2015-03-19 MED ORDER — LORAZEPAM 2 MG/ML IJ SOLN
1.0000 mg | INTRAMUSCULAR | Status: DC | PRN
  Administered 2015-03-19 – 2015-03-20 (×3): 1 mg via INTRAVENOUS
  Administered 2015-03-20 – 2015-03-21 (×2): 2 mg via INTRAVENOUS
  Filled 2015-03-19 (×5): qty 1

## 2015-03-19 MED ORDER — SODIUM CHLORIDE 0.9 % IV SOLN
2.0000 mg/h | INTRAVENOUS | Status: DC
  Administered 2015-03-19: 2 mg/h via INTRAVENOUS
  Filled 2015-03-19: qty 10

## 2015-03-19 MED ORDER — POTASSIUM CHLORIDE CRYS ER 20 MEQ PO TBCR
40.0000 meq | EXTENDED_RELEASE_TABLET | Freq: Once | ORAL | Status: AC
  Administered 2015-03-19: 40 meq via ORAL
  Filled 2015-03-19: qty 2

## 2015-03-19 NOTE — Progress Notes (Signed)
Advanced Heart Failure Rounding Note   Subjective:    Extubated  03/15/14.  Remains very tenuous. On NRB at 15L otherwise desats. Initially was saying she wanted to take mask off and be comfortable but now wants to continue with mask.  CVP 12. Hgb improved after transfusion. No further melena once heparin off.   Objective:   Weight Range:  Vital Signs:   Temp:  [97.4 F (36.3 C)-98.8 F (37.1 C)] 97.4 F (36.3 C) (05/28 0800) Pulse Rate:  [108-119] 111 (05/28 0413) Resp:  [22-41] 22 (05/28 0800) BP: (92-146)/(36-97) 105/81 mmHg (05/28 0800) SpO2:  [80 %-100 %] 100 % (05/28 0413) Weight:  [65.5 kg (144 lb 6.4 oz)] 65.5 kg (144 lb 6.4 oz) (05/28 0413) Last BM Date: 03/18/15  Weight change: Filed Weights   03/17/15 0500 03/18/15 0300 03/19/15 0413  Weight: 63.05 kg (139 lb) 62.8 kg (138 lb 7.2 oz) 65.5 kg (144 lb 6.4 oz)    Intake/Output:   Intake/Output Summary (Last 24 hours) at 03/19/15 0925 Last data filed at 03/19/15 0900  Gross per 24 hour  Intake 1842.6 ml  Output   2855 ml  Net -1012.4 ml     Physical Exam: General: Fatigues appearing. Tachypneic HEENT: erythematous Neck: thick . JVP up Carotids 2+ bilat; no bruits. No lymphadenopathy or thryomegaly appreciated.  Cor: PMI nonpalpable Regular rate & rhythm. 2/6 AS radiates to bilateral carotids.  Hickman catheter ok Lungs: mildly tachypneic. Clear  Abdomen: obese. soft, nontender, nondistended. No hepatosplenomegaly. No bruits or masses. Good bowel sounds. Extremities: no cyanosis, + clubbing, macular-papular erythematous rash on LEs, trace edema  Neuro:moves all 4 without difficulty  Telemetry: Sinus tach   Labs: Basic Metabolic Panel:  Recent Labs Lab 03/12/15 1105 03/12/15 2336  03/13/15 0500 03/14/15 0232 03/15/15 0533 03/17/15 0112 03/19/15 0500  NA  --   --   --  139 139 139 138 135  K  --   --   --  4.7 4.4 4.5 3.7 2.6*  CL  --   --   --  98* 96* 96* 98* 95*  CO2  --   --   --  33*  33* 31 28 30   GLUCOSE  --   --   --  205* 203* 305* 116* 100*  BUN  --   --   --  22* 25* 40* 41* 24*  CREATININE  --   --   --  0.89 0.91 1.08* 0.99 0.86  CALCIUM  --   --   < > 8.8* 8.8* 9.2 8.2* 8.3*  MG 2.4 2.4  --   --   --   --  2.0  --   PHOS 3.1 3.6  --   --   --   --  4.4  --   < > = values in this interval not displayed.  Liver Function Tests:  Recent Labs Lab 03/15/15 0533  AST 34  ALT 25  ALKPHOS 109  BILITOT 0.6  PROT 6.2*  ALBUMIN 2.5*   No results for input(s): LIPASE, AMYLASE in the last 168 hours.  Recent Labs Lab 03/17/15 1724  AMMONIA 21    CBC:  Recent Labs Lab 03/15/15 0533 03/17/15 0112 03/18/15 0441 03/18/15 1720 03/19/15 0500  WBC 13.4* 19.8* 20.1* 17.9* 18.3*  NEUTROABS 12.6*  --   --   --   --   HGB 11.4* 8.9* 6.7* 9.8* 9.4*  HCT 36.7 27.9* 21.1* 30.0* 28.7*  MCV 80.8 80.4 80.8  81.1 80.8  PLT 348 324 273 223 214    Cardiac Enzymes:  Recent Labs Lab 03/15/15 0830 03/15/15 1230 03/16/15 1000 03/16/15 1355 03/16/15 2200  TROPONINI 1.53* 2.71* 4.01* 3.80* 3.63*    BNP: BNP (last 3 results)  Recent Labs  10/20/14 1400 03/07/2015 2330  BNP 224.2* 1442.0*    ProBNP (last 3 results) No results for input(s): PROBNP in the last 8760 hours.    Other results:  Imaging: Dg Chest Port 1 View  03/17/2015   CLINICAL DATA:  Respiratory distress for 1 day.  EXAM: PORTABLE CHEST - 1 VIEW  COMPARISON:  03/17/2015  FINDINGS: Cardiomegaly and CABG changes noted.  A right IJ central venous catheter with tip overlying the superior cavoatrial junction and left subclavian central venous catheter with tip overlying the upper SVC/azygos region again noted.  Bilateral opacities and lower lung atelectasis again noted.  There is no evidence of pneumothorax.  IMPRESSION: Unchanged chest radiograph with bilateral opacities-question edema versus pneumonia. Continued bilateral lower lung atelectasis.   Electronically Signed   By: Margarette Canada M.D.    On: 03/17/2015 17:34     Medications:     Scheduled Medications: . aspirin EC  81 mg Oral Daily  . clopidogrel  75 mg Oral Daily  . DULoxetine  90 mg Oral q morning - 10a  . ezetimibe-simvastatin  1 tablet Oral QPM  . ice pack  1 each Other Q6H  . insulin aspart  0-15 Units Subcutaneous 6 times per day  . isosorbide mononitrate  30 mg Oral BID  . lactose free nutrition  237 mL Oral TID BM  . levofloxacin (LEVAQUIN) IV  750 mg Intravenous Q48H  . levothyroxine  25 mcg Oral QAC breakfast  . oxymetazoline  2 spray Left Nare BID  . pantoprazole (PROTONIX) IV  40 mg Intravenous Q12H  . potassium chloride  20 mEq Oral Daily    Infusions: . epoprostenol 32.57 ng/kg/min (03/18/15 2000)    PRN Medications: sodium chloride, albuterol, diphenhydrAMINE, HYDROmorphone, ondansetron (ZOFRAN) IV   Assessment:   1. Respiratory arrest 2. Acute on chronic respiratory failure - likely mixed picture 3. Acute on chronic diastolic HF 4. Pulmonary HTN on Flolan 5. CAD s/p CABG 6. HCAP 7. Hypomagensemia/hypokalemia 8. Hypertension  9. Elevated troponin - likely due to demand ischemia 10. Anemia- Hgb 5.7  11. DNR   Plan/Discussion:    She remains very tenuous from a respiratory perspective. She has very little reserve. Would continue current therapy for now including IV lasix. If worsening low threshold to start morphine gtt and proceed with comfort care.   Dontrae Morini,MD 9:25 AM

## 2015-03-19 NOTE — Progress Notes (Signed)
eLink Physician-Brief Progress Note Patient Name: MITSUKO LUERA DOB: 1945/11/16 MRN: [id number]   Date of Service  03/19/2015  HPI/Events of Note  Patient c/o abdominal pain. Now on Comfort Measure.   eICU Interventions  Will add Morphine 2 mg IV Q 1 hour PRN pain.      Intervention Category Intermediate Interventions: Abdominal pain - evaluation and management  Sharol Croghan Eugene 03/19/2015, 7:52 PM

## 2015-03-19 NOTE — Progress Notes (Addendum)
Critical lab: K 2.6, called to [facility]. Orders received.

## 2015-03-19 NOTE — Progress Notes (Signed)
[facility] PROGRESS NOTE  Meagan [name] UTM:[id number] DOB: 1945/12/19 DOA: 03/02/2015 PCP: Glo Herring., MD   From original HPI: 69 y.o. F with severe PH (on Flolan) brought to [facility] ED 5/19 with SOB. Found to have HCAP and AoC hypercarbic respiratory failure. Started on BiPAP and transferred to [facility] for further evaluation and management.    Assessment/Plan: Active Problems:   Sepsis - Patient has been treated with 8 days of antibiotic therapy. First 3 days were treated with Vancomycin and Zosyn - Will continue levaquin at this juncture  Elevated troponin - Per prior notes either demand ischemia vs NSTEMI, agree with holding heparin in lieu of worsening anemia and possible GI bleed. - Cardiology on board and assisting.  Hypokalemia - most likely from poor oral intake and lasix administration - replace orally and IV  Anemia - improved after transfusion - Could be from GI source as nursing reports dark stools yesterday. Heparin has been discontinued and no more tarry stools reported. - Given cardiac history would prefer to maintain hemoglobin levels above the 8.0    Acute on chronic respiratory failure - If worsening respiratory condition despite Bipap plan would be for palliative therapy to commence  - Consult pulmonology to see if they can optimize medical management. - add xanax for anxiety related to sob    Acute pulmonary edema - Cardiology on board and assisting with management -  foley catheter - agree with lasix administration - monitor intake and output    Pneumonia, organism unspecified - chest x ray reporting BL opacities question edema vs pna - Will continue levaquin  Code Status: DNR Family Communication: discussed with sister at bedside Disposition Plan: If patient declines despite aggressive therapy plan would be for palliative  care   Consultants:  Cardiology  PCCM  Procedures:  None  Antibiotics:  levaquin  HPI/Subjective: Patient is currently on non rebreather. Requested something for nausea. Also c/o anxiety  Objective: Filed Vitals:   03/19/15 0800  BP: 105/81  Pulse:   Temp: 97.4 F (36.3 C)  Resp: 22    Intake/Output Summary (Last 24 hours) at 03/19/15 1057 Last data filed at 03/19/15 0900  Gross per 24 hour  Intake 1504.52 ml  Output   2705 ml  Net -1200.48 ml   Filed Weights   03/17/15 0500 03/18/15 0300 03/19/15 0413  Weight: 63.05 kg (139 lb) 62.8 kg (138 lb 7.2 oz) 65.5 kg (144 lb 6.4 oz)    Exam:   General:  Awake and alert, with increased wob sitting up in bed  Cardiovascular: no cyanosis, warm extremities  Respiratory: increased wob, equal chest rise, no audible wheezes  Abdomen: soft, nd, nt  Musculoskeletal: equal muscle tone   Data Reviewed: Basic Metabolic Panel:  Recent Labs Lab 03/12/15 1105 03/12/15 2336 03/13/15 0500 03/14/15 0232 03/15/15 0533 03/17/15 0112 03/19/15 0500  NA  --   --  139 139 139 138 135  K  --   --  4.7 4.4 4.5 3.7 2.6*  CL  --   --  98* 96* 96* 98* 95*  CO2  --   --  33* 33* 31 28 30   GLUCOSE  --   --  205* 203* 305* 116* 100*  BUN  --   --  22* 25* 40* 41* 24*  CREATININE  --   --  0.89 0.91 1.08* 0.99 0.86  CALCIUM  --   --  8.8* 8.8* 9.2 8.2* 8.3*  MG 2.4 2.4  --   --   --  2.0  --   PHOS 3.1 3.6  --   --   --  4.4  --    Liver Function Tests:  Recent Labs Lab 03/15/15 0533  AST 34  ALT 25  ALKPHOS 109  BILITOT 0.6  PROT 6.2*  ALBUMIN 2.5*   No results for input(s): LIPASE, AMYLASE in the last 168 hours.  Recent Labs Lab 03/17/15 1724  AMMONIA 21   CBC:  Recent Labs Lab 03/15/15 0533 03/17/15 0112 03/18/15 0441 03/18/15 1720 03/19/15 0500  WBC 13.4* 19.8* 20.1* 17.9* 18.3*  NEUTROABS 12.6*  --   --   --   --   HGB 11.4* 8.9* 6.7* 9.8* 9.4*  HCT 36.7 27.9* 21.1* 30.0* 28.7*  MCV 80.8 80.4  80.8 81.1 80.8  PLT 348 324 273 223 214   Cardiac Enzymes:  Recent Labs Lab 03/15/15 0830 03/15/15 1230 03/16/15 1000 03/16/15 1355 03/16/15 2200  TROPONINI 1.53* 2.71* 4.01* 3.80* 3.63*   BNP (last 3 results)  Recent Labs  10/20/14 1400 03/03/2015 2330  BNP 224.2* 1442.0*    ProBNP (last 3 results) No results for input(s): PROBNP in the last 8760 hours.  CBG:  Recent Labs Lab 03/18/15 1719 03/18/15 2004 03/18/15 2341 03/19/15 0414 03/19/15 0813  GLUCAP 142* 149* 103* 101* 102*    Recent Results (from the past 240 hour(s))  Culture, blood (routine x 2)     Status: None   Collection Time: 03/10/15 12:34 AM  Result Value Ref Range Status   Specimen Description BLOOD RIGHT ANTECUBITAL  Final   Special Requests   Final    BOTTLES DRAWN AEROBIC AND ANAEROBIC AEB=7CC ANA=5CC   Culture NO GROWTH 5 DAYS  Final   Report Status 03/15/2015 FINAL  Final  Culture, blood (routine x 2)     Status: None   Collection Time: 03/10/15 12:40 AM  Result Value Ref Range Status   Specimen Description BLOOD LEFT WRIST  Final   Special Requests BOTTLES DRAWN AEROBIC AND ANAEROBIC 5CC EACH  Final   Culture NO GROWTH 5 DAYS  Final   Report Status 03/15/2015 FINAL  Final  Urine culture     Status: None   Collection Time: 03/10/15  1:52 AM  Result Value Ref Range Status   Specimen Description URINE, CLEAN CATCH  Final   Special Requests NONE  Final   Colony Count NO GROWTH Performed at [facility]   Final   Culture NO GROWTH Performed at [facility]   Final   Report Status 03/11/2015 FINAL  Final  MRSA PCR Screening     Status: None   Collection Time: 03/10/15  3:28 AM  Result Value Ref Range Status   MRSA by PCR NEGATIVE NEGATIVE Final    Comment:        The GeneXpert MRSA Assay (FDA approved for NASAL specimens only), is one component of a comprehensive MRSA colonization surveillance program. It is not intended to diagnose MRSA infection nor to guide  or monitor treatment for MRSA infections.   Culture, respiratory (NON-Expectorated)     Status: None   Collection Time: 03/10/15 10:42 AM  Result Value Ref Range Status   Specimen Description TRACHEAL ASPIRATE  Final   Special Requests Normal  Final   Gram Stain   Final    RARE WBC PRESENT,BOTH PMN AND MONONUCLEAR RARE SQUAMOUS EPITHELIAL CELLS PRESENT NO ORGANISMS SEEN Performed at [facility]    Culture   Final    NO GROWTH  2 DAYS Performed at [facility]    Report Status 03/12/2015 FINAL  Final  Clostridium Difficile by PCR     Status: None   Collection Time: 03/16/15  7:55 AM  Result Value Ref Range Status   C difficile by pcr NEGATIVE NEGATIVE Final     Studies: Dg Chest Port 1 View  03/17/2015   CLINICAL DATA:  Respiratory distress for 1 day.  EXAM: PORTABLE CHEST - 1 VIEW  COMPARISON:  03/17/2015  FINDINGS: Cardiomegaly and CABG changes noted.  A right IJ central venous catheter with tip overlying the superior cavoatrial junction and left subclavian central venous catheter with tip overlying the upper SVC/azygos region again noted.  Bilateral opacities and lower lung atelectasis again noted.  There is no evidence of pneumothorax.  IMPRESSION: Unchanged chest radiograph with bilateral opacities-question edema versus pneumonia. Continued bilateral lower lung atelectasis.   Electronically Signed   By: Margarette Canada M.D.   On: 03/17/2015 17:34    Scheduled Meds: . aspirin EC  81 mg Oral Daily  . clopidogrel  75 mg Oral Daily  . DULoxetine  90 mg Oral q morning - 10a  . ezetimibe-simvastatin  1 tablet Oral QPM  . ice pack  1 each Other Q6H  . insulin aspart  0-15 Units Subcutaneous 6 times per day  . isosorbide mononitrate  30 mg Oral BID  . lactose free nutrition  237 mL Oral TID BM  . levofloxacin (LEVAQUIN) IV  750 mg Intravenous Q48H  . levothyroxine  25 mcg Oral QAC breakfast  . oxymetazoline  2 spray Left Nare BID  . pantoprazole (PROTONIX) IV  40 mg  Intravenous Q12H  . potassium chloride  20 mEq Oral Daily  . potassium chloride  40 mEq Oral Once   Continuous Infusions: . epoprostenol 32.57 ng/kg/min (03/18/15 2000)     Time spent: > 35 minutes    Velvet Bathe  [facility] Pager [phone] If 7PM-7AM, please contact night-coverage at [email or web address], password [facility] 03/19/2015, 10:57 AM  LOS: 9 days

## 2015-03-19 NOTE — Progress Notes (Addendum)
PULMONARY / CRITICAL CARE MEDICINE   Name: Meagan [name] MRN: [id number] DOB: 06-27-1946    ADMISSION DATE:  02/21/2015 CONSULTATION DATE:  03/19/2015  REFERRING MD :  EDP at [facility]:  SOB  INITIAL PRESENTATION:  69 y.o. F with severe PH (on Flolan) brought to [facility] ED 5/19 with SOB.  Found to have HCAP and AoC hypercarbic respiratory failure.  Started on BiPAP and transferred to [facility] for further evaluation and management.     STUDIES:  12/2014 RHC RA = 16 RV = 85/5/18 PA = 80/20 (45) PCW = 22 Fick cardiac output/index = 4.3/2.6 PVR = 5.6 WU Ao sat = 89% PA sat = 45%, 47%  CXR 5/19 >>> low lung volumes, cardiomegaly, pulmonary edema.  SIGNIFICANT EVENTS: 5/19 - admitted to [facility] with HCAP and AoC hypercarbic respiratory failure  SUBJECTIVE:  Pt progressively worse with more anxiety and increased oxygen needs. Family is present.  VITAL SIGNS: Temp:  [97.4 F (36.3 C)-98.8 F (37.1 C)] 97.4 F (36.3 C) (05/28 0800) Pulse Rate:  [108-119] 111 (05/28 0413) Resp:  [22-41] 22 (05/28 0800) BP: (95-146)/(36-81) 105/81 mmHg (05/28 0800) SpO2:  [83 %-100 %] 100 % (05/28 0413) Weight:  [65.5 kg (144 lb 6.4 oz)] 65.5 kg (144 lb 6.4 oz) (05/28 0413) HEMODYNAMICS: CVP:  [10 mmHg-15 mmHg] 10 mmHg    INTAKE / OUTPUT: Intake/Output      05/27 0701 - 05/28 0700 05/28 0701 - 05/29 0700   P.O. 720 150   I.V. (mL/kg) 181.9 (2.8)    Blood 661    IV Piggyback 150    Total Intake(mL/kg) 1712.9 (26.2) 150 (2.3)   Urine (mL/kg/hr) 2905 (1.8) 150 (0.5)   Stool 0 (0) 0 (0)   Total Output 2905 150   Net -1192.1 0        Stool Occurrence 5 x 1 x     PHYSICAL EXAMINATION: General: Chronically ill appearing female, anxious and in some distress Neuro: severely anxious, hard of hearing  HEENT: /AT. PERRL, sclerae anicteric. Cardiovascular: RRR, no M/R/G.  Lungs: rales, poor airflow Abdomen: Obese, BS x 4, soft, NT/ND.  Musculoskeletal: No gross deformities, no edema.  Skin: Intact,  warm, no rashes.  LABS:  CBC  Recent Labs Lab 03/18/15 0441 03/18/15 1720 03/19/15 0500  WBC 20.1* 17.9* 18.3*  HGB 6.7* 9.8* 9.4*  HCT 21.1* 30.0* 28.7*  PLT 273 223 214   Coag's No results for input(s): APTT, INR in the last 168 hours. BMET  Recent Labs Lab 03/15/15 0533 03/17/15 0112 03/19/15 0500  NA 139 138 135  K 4.5 3.7 2.6*  CL 96* 98* 95*  CO2 31 28 30   BUN 40* 41* 24*  CREATININE 1.08* 0.99 0.86  GLUCOSE 305* 116* 100*   Electrolytes  Recent Labs Lab 03/12/15 2336  03/15/15 0533 03/17/15 0112 03/19/15 0500  CALCIUM  --   < > 9.2 8.2* 8.3*  MG 2.4  --   --  2.0  --   PHOS 3.6  --   --  4.4  --   < > = values in this interval not displayed. Sepsis Markers No results for input(s): LATICACIDVEN, PROCALCITON, O2SATVEN in the last 168 hours. ABG  Recent Labs Lab 03/17/15 1435  PHART 7.475*  PCO2ART 42.9  PO2ART 68.4*   Liver Enzymes  Recent Labs Lab 03/15/15 0533  AST 34  ALT 25  ALKPHOS 109  BILITOT 0.6  ALBUMIN 2.5*   Cardiac Enzymes  Recent Labs  Lab 03/16/15 1000 03/16/15 1355 03/16/15 2200  TROPONINI 4.01* 3.80* 3.63*   Glucose  Recent Labs Lab 03/18/15 1140 03/18/15 1719 03/18/15 2004 03/18/15 2341 03/19/15 0414 03/19/15 0813  GLUCAP 80 142* 149* 103* 101* 102*    Imaging No results found.   ASSESSMENT / PLAN:  PULMONARY A: Acute on chronic hypercarbic and hypoxemic respiratory failure - on 6L O2 chronically, now worse on 100% 15L oxygen HCAP Severe PAH - on Flolan (followed by Dr. 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  Leave flolan dose at current levels, may titrate down if other comfort measures do not progress  CARDIOVASCULAR A:  Hypotension Chronic dCHF - last echo from 02/21/14 at [facility] with increase in estimated RVSP and slight decrease in RV  function Elevated BNP Ischemic ventricle Troponin leak - demand ischemia vs NSTEMI Hx CAD s/p CABG P:  Focus on comfort measures    INFECTIOUS A:   HCAP P:   Currently on levaquin, with comfort care will d/c levaquin   NEUROLOGIC A:   Acute hypercarbic encephalopathy Severe anxiety Chronic pain P:   Morphine drip Ativan IV prn   Family updated: Husband at bedside and sisters. They are aware and are supportive of comfort care approach  CASE discussed with Drs Jeffie Pollock and [facility] who agree [place]  [phone]  Cell  [phone]  If no response or cell goes to voicemail, call beeper [phone]   03/19/2015, 11:27 AM

## 2015-03-20 DIAGNOSIS — Z515 Encounter for palliative care: Secondary | ICD-10-CM

## 2015-03-20 DIAGNOSIS — Z66 Do not resuscitate: Secondary | ICD-10-CM

## 2015-03-20 NOTE — Progress Notes (Signed)
Patient ID: Meagan [name], female   DOB: 1946/04/12, 69 y.o.   MRN: [id number] Advanced Heart Failure Rounding Note   Subjective:    Extubated  03/15/14.  On NRB at 15L, otherwise desats.   Transitioned to comfort care yesterday, on morphine gtt.  Drowsy but awakens, says she feels "jittery."  No labs.    Objective:   Weight Range:  Vital Signs:   Temp:  [97.3 F (36.3 C)-98.1 F (36.7 C)] 98.1 F (36.7 C) (05/29 0739) Pulse Rate:  [73-119] 116 (05/29 0739) Resp:  [23-47] 26 (05/29 0739) BP: (96-143)/(53-79) 143/79 mmHg (05/29 0739) SpO2:  [94 %-100 %] 97 % (05/29 0739) Weight:  [142 lb 13.7 oz (64.8 kg)] 142 lb 13.7 oz (64.8 kg) (05/29 0424) Last BM Date: 03/19/15  Weight change: Filed Weights   03/18/15 0300 03/19/15 0413 03/20/15 0424  Weight: 138 lb 7.2 oz (62.8 kg) 144 lb 6.4 oz (65.5 kg) 142 lb 13.7 oz (64.8 kg)    Intake/Output:   Intake/Output Summary (Last 24 hours) at 03/20/15 0831 Last data filed at 03/20/15 0800  Gross per 24 hour  Intake 862.65 ml  Output    925 ml  Net -62.35 ml     Physical Exam: General: Fatigues appearing. Tachypneic HEENT: erythematous Neck: thick . JVP up Carotids 2+ bilat; no bruits. No lymphadenopathy or thryomegaly appreciated.  Cor: PMI nonpalpable Regular rate & rhythm. 2/6 AS radiates to bilateral carotids.  Hickman catheter ok Lungs: mildly tachypneic. Clear  Abdomen: obese. soft, nontender, nondistended. No hepatosplenomegaly. No bruits or masses. Good bowel sounds. Extremities: no cyanosis, + clubbing, macular-papular erythematous rash on LEs, trace edema  Neuro:moves all 4 without difficulty  Telemetry: Sinus tach   Labs: Basic Metabolic Panel:  Recent Labs Lab 03/14/15 0232 03/15/15 0533 03/17/15 0112 03/19/15 0500  NA 139 139 138 135  K 4.4 4.5 3.7 2.6*  CL 96* 96* 98* 95*  CO2 33* 31 28 30   GLUCOSE 203* 305* 116* 100*  BUN 25* 40* 41* 24*  CREATININE 0.91 1.08* 0.99 0.86  CALCIUM 8.8* 9.2 8.2*  8.3*  MG  --   --  2.0  --   PHOS  --   --  4.4  --     Liver Function Tests:  Recent Labs Lab 03/15/15 0533  AST 34  ALT 25  ALKPHOS 109  BILITOT 0.6  PROT 6.2*  ALBUMIN 2.5*   No results for input(s): LIPASE, AMYLASE in the last 168 hours.  Recent Labs Lab 03/17/15 1724  AMMONIA 21    CBC:  Recent Labs Lab 03/15/15 0533 03/17/15 0112 03/18/15 0441 03/18/15 1720 03/19/15 0500  WBC 13.4* 19.8* 20.1* 17.9* 18.3*  NEUTROABS 12.6*  --   --   --   --   HGB 11.4* 8.9* 6.7* 9.8* 9.4*  HCT 36.7 27.9* 21.1* 30.0* 28.7*  MCV 80.8 80.4 80.8 81.1 80.8  PLT 348 324 273 223 214    Cardiac Enzymes:  Recent Labs Lab 03/15/15 0830 03/15/15 1230 03/16/15 1000 03/16/15 1355 03/16/15 2200  TROPONINI 1.53* 2.71* 4.01* 3.80* 3.63*    BNP: BNP (last 3 results)  Recent Labs  10/20/14 1400 03/19/2015 2330  BNP 224.2* 1442.0*    ProBNP (last 3 results) No results for input(s): PROBNP in the last 8760 hours.    Other results:  Imaging: No results found.   Medications:     Scheduled Medications: . ice pack  1 each Other Q6H  . lactose free nutrition  237 mL Oral TID BM  . oxymetazoline  2 spray Left Nare BID  . potassium chloride  20 mEq Oral Daily    Infusions: . epoprostenol 636.972 ng/kg/min (03/20/15 0800)  . morphine 2 mg/hr (03/19/15 2000)    PRN Medications: sodium chloride, albuterol, diphenhydrAMINE, LORazepam, morphine, ondansetron (ZOFRAN) IV   Assessment:   1. Respiratory arrest 2. Acute on chronic respiratory failure - likely mixed picture 3. Acute on chronic diastolic HF 4. Pulmonary HTN on Flolan 5. CAD s/p CABG 6. HCAP 7. Hypomagensemia/hypokalemia 8. Hypertension  9. Elevated troponin - likely due to demand ischemia 10. Anemia- Hgb 5.7  11. DNR   Plan/Discussion:    She remains very tenuous from a respiratory perspective. She has very little reserve. After discussion yesterday she was made comfort care, now on morphine  gtt.  No labs.  Ativan for anxiety.  Will involve palliative care service.    Meagan Gayheart,MD 03/20/2015

## 2015-03-20 NOTE — Progress Notes (Signed)
PULMONARY / CRITICAL CARE MEDICINE   Name: Meagan [name] MRN: [id number] DOB: November 11, 1945    ADMISSION DATE:  03/17/2015 CONSULTATION DATE:  03/20/2015  REFERRING MD :  EDP at [place]:  SOB  INITIAL PRESENTATION:  69 y.o. F with severe PH (on Flolan) brought to [facility] ED 5/19 with SOB.  Found to have HCAP and AoC hypercarbic respiratory failure.  Started on BiPAP and transferred to [facility] for further evaluation and management.     STUDIES:  12/2014 RHC RA = 16 RV = 85/5/18 PA = 80/20 (45) PCW = 22 Fick cardiac output/index = 4.3/2.6 PVR = 5.6 WU Ao sat = 89% PA sat = 45%, 47%  CXR 5/19 >>> low lung volumes, cardiomegaly, pulmonary edema.  SIGNIFICANT EVENTS: 5/19 - admitted to [facility] with HCAP and AoC hypercarbic respiratory failure  SUBJECTIVE:  Pt progressively worse with more anxiety and increased oxygen needs. Family is present.  VITAL SIGNS: Temp:  [97.3 F (36.3 C)-98.1 F (36.7 C)] 98.1 F (36.7 C) (05/29 0739) Pulse Rate:  [113-119] 116 (05/29 0739) Resp:  [23-47] 26 (05/29 0739) BP: (96-143)/(53-79) 143/79 mmHg (05/29 0739) SpO2:  [94 %-100 %] 97 % (05/29 0739) Weight:  [64.8 kg (142 lb 13.7 oz)] 64.8 kg (142 lb 13.7 oz) (05/29 0424) HEMODYNAMICS:      INTAKE / OUTPUT: Intake/Output      05/28 0701 - 05/29 0700 05/29 0701 - 05/30 0700   P.O. 610 240   I.V. (mL/kg) 376.8 (5.8) 30.2 (0.5)   Blood     IV Piggyback     Total Intake(mL/kg) 986.8 (15.2) 270.2 (4.2)   Urine (mL/kg/hr) 805 (0.5) 120 (0.6)   Stool 0 (0)    Total Output 805 120   Net +181.8 +150.2        Stool Occurrence 4 x      PHYSICAL EXAMINATION: General: Chronically ill appearing female, anxious and in some distress Neuro: severely anxious, hard of hearing  HEENT: [place]/AT. PERRL, sclerae anicteric. Cardiovascular: RRR, no M/R/G.  Lungs: rales, poor airflow Abdomen: Obese, BS x 4, soft, NT/ND.  Musculoskeletal: No gross deformities, no edema.  Skin: Intact, warm, no  rashes.  LABS:  CBC  Recent Labs Lab 03/18/15 0441 03/18/15 1720 03/19/15 0500  WBC 20.1* 17.9* 18.3*  HGB 6.7* 9.8* 9.4*  HCT 21.1* 30.0* 28.7*  PLT 273 223 214   Coag's No results for input(s): APTT, INR in the last 168 hours. BMET  Recent Labs Lab 03/15/15 0533 03/17/15 0112 03/19/15 0500  NA 139 138 135  K 4.5 3.7 2.6*  CL 96* 98* 95*  CO2 31 28 30   BUN 40* 41* 24*  CREATININE 1.08* 0.99 0.86  GLUCOSE 305* 116* 100*   Electrolytes  Recent Labs Lab 03/15/15 0533 03/17/15 0112 03/19/15 0500  CALCIUM 9.2 8.2* 8.3*  MG  --  2.0  --   PHOS  --  4.4  --    Sepsis Markers No results for input(s): LATICACIDVEN, PROCALCITON, O2SATVEN in the last 168 hours. ABG  Recent Labs Lab 03/17/15 1435  PHART 7.475*  PCO2ART 42.9  PO2ART 68.4*   Liver Enzymes  Recent Labs Lab 03/15/15 0533  AST 34  ALT 25  ALKPHOS 109  BILITOT 0.6  ALBUMIN 2.5*   Cardiac Enzymes  Recent Labs Lab 03/16/15 1000 03/16/15 1355 03/16/15 2200  TROPONINI 4.01* 3.80* 3.63*   Glucose  Recent Labs Lab 03/18/15 1140 03/18/15 1719 03/18/15 2004 03/18/15 2341 03/19/15 0414 03/19/15 0813  GLUCAP 80 142* 149* 103* 101* 102*    Imaging No results found.   ASSESSMENT / PLAN:  PULMONARY A: Acute on chronic hypercarbic and hypoxemic respiratory failure - on 6L O2 chronically, now worse on 100% 15L oxygen HCAP Severe PAH - on Flolan (followed by Dr. 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 Pt and family aware and ok with more comfort care approach  P:   Cont comfort care Could consider reducing flolan to off in facilitate move to pall care bed For now Leave flolan dose at current levels  CARDIOVASCULAR A:  Hypotension Chronic dCHF - last echo from 02/21/14 at [facility] with increase in estimated RVSP and slight decrease in RV function Elevated BNP Ischemic ventricle Troponin leak - demand ischemia vs NSTEMI Hx CAD s/p  CABG P:  Focus on comfort measures   NEUROLOGIC A:   Acute hypercarbic encephalopathy Severe anxiety Chronic pain All above much improved with morphine drip and prn ativan  P:   On Morphine drip Ativan IV prn   Family updated: Husband at bedside and sisters. They are aware and are supportive of comfort care approach to continue. They agree with all plans .    PCCM will SIGN OFF, Call us again prn Involving pall care team is a good plan     Glyn Ade  [phone]  Cell  [phone]  If no response or cell goes to voicemail, call beeper [phone]   03/20/2015, 10:08 AM

## 2015-03-20 NOTE — Progress Notes (Signed)
[facility] PROGRESS NOTE  Meagan [name] QMG:[id number] DOB: December 31, 1945 DOA: 03/17/2015 PCP: Glo Herring., MD   From original HPI: 70 y.o. F with severe PH (on Flolan) brought to [facility] ED 5/19 with SOB. Found to have HCAP and AoC hypercarbic respiratory failure. Started on BiPAP and transferred to [facility] for further evaluation and management.    Assessment/Plan: Active Problems:   Sepsis - Patient has been treated with 8 days of antibiotic therapy. First 3 days were treated with Vancomycin and Zosyn - Plan is for comfort care at this moment  Elevated troponin - Per prior notes either demand ischemia vs NSTEMI, agree with holding heparin in lieu of worsening anemia and possible GI bleed. - Cardiology on board during hospital stay. Currently plans are for comfort care.  Hypokalemia - replaced orally and IV  Anemia - Heparin discontinued due to suspected GI bleed - comfort care     Acute on chronic respiratory failure - pulmonology assisted. Patient is currently on morphine infusion   Code Status: DNR Family Communication: discussed with sister at bedside Disposition Plan: transfer to palliative floor for palliative care   Consultants:  Cardiology  PCCM  Procedures:  None  Antibiotics:  levaquin  HPI/Subjective: Patient resting  Objective: Filed Vitals:   03/20/15 1500  BP:   Pulse: 111  Temp:   Resp: 21    Intake/Output Summary (Last 24 hours) at 03/20/15 1551 Last data filed at 03/20/15 1400  Gross per 24 hour  Intake 961.92 ml  Output    505 ml  Net 456.92 ml   Filed Weights   03/18/15 0300 03/19/15 0413 03/20/15 0424  Weight: 62.8 kg (138 lb 7.2 oz) 65.5 kg (144 lb 6.4 oz) 64.8 kg (142 lb 13.7 oz)    Exam:   General:  Resting in bed supine comfortably  Cardiovascular: no cyanosis, warm extremities  Respiratory: increased wob, equal chest rise, no audible wheezes  Abdomen: soft, nd, no guarding  Data Reviewed: Basic  Metabolic Panel:  Recent Labs Lab 03/14/15 0232 03/15/15 0533 03/17/15 0112 03/19/15 0500  NA 139 139 138 135  K 4.4 4.5 3.7 2.6*  CL 96* 96* 98* 95*  CO2 33* 31 28 30   GLUCOSE 203* 305* 116* 100*  BUN 25* 40* 41* 24*  CREATININE 0.91 1.08* 0.99 0.86  CALCIUM 8.8* 9.2 8.2* 8.3*  MG  --   --  2.0  --   PHOS  --   --  4.4  --    Liver Function Tests:  Recent Labs Lab 03/15/15 0533  AST 34  ALT 25  ALKPHOS 109  BILITOT 0.6  PROT 6.2*  ALBUMIN 2.5*   No results for input(s): LIPASE, AMYLASE in the last 168 hours.  Recent Labs Lab 03/17/15 1724  AMMONIA 21   CBC:  Recent Labs Lab 03/15/15 0533 03/17/15 0112 03/18/15 0441 03/18/15 1720 03/19/15 0500  WBC 13.4* 19.8* 20.1* 17.9* 18.3*  NEUTROABS 12.6*  --   --   --   --   HGB 11.4* 8.9* 6.7* 9.8* 9.4*  HCT 36.7 27.9* 21.1* 30.0* 28.7*  MCV 80.8 80.4 80.8 81.1 80.8  PLT 348 324 273 223 214   Cardiac Enzymes:  Recent Labs Lab 03/15/15 0830 03/15/15 1230 03/16/15 1000 03/16/15 1355 03/16/15 2200  TROPONINI 1.53* 2.71* 4.01* 3.80* 3.63*   BNP (last 3 results)  Recent Labs  10/20/14 1400 02/21/2015 2330  BNP 224.2* 1442.0*    ProBNP (last 3 results) No results for input(s): PROBNP  in the last 8760 hours.  CBG:  Recent Labs Lab 03/18/15 1719 03/18/15 2004 03/18/15 2341 03/19/15 0414 03/19/15 0813  GLUCAP 142* 149* 103* 101* 102*    Recent Results (from the past 240 hour(s))  Clostridium Difficile by PCR     Status: None   Collection Time: 03/16/15  7:55 AM  Result Value Ref Range Status   C difficile by pcr NEGATIVE NEGATIVE Final     Studies: No results found.  Scheduled Meds: . ice pack  1 each Other Q6H  . lactose free nutrition  237 mL Oral TID BM  . oxymetazoline  2 spray Left Nare BID   Continuous Infusions: . epoprostenol 636.972 ng/kg/min (03/20/15 0800)  . morphine 2 mg/hr (03/19/15 2000)     Time spent: > 35 minutes    Velvet Bathe  [facility] Pager [phone] If 7PM-7AM, please contact night-coverage at [email or web address], password [facility] 03/20/2015, 3:51 PM  LOS: 10 days

## 2015-03-23 NOTE — Consult Note (Signed)
Attempted to see patient this morning but passed away at 715AM. Thanks for involving palliative medicine service.    Doran Clay D.O. Palliative Medicine Team at [facility]  Pager: [phone] Team Phone: [phone]

## 2015-03-23 NOTE — Progress Notes (Addendum)
150cc morphine wasted in sink, witnessed by 2 RNs. Adella Hare, RN  Wasted 150cc morphine in sink with Adella Hare, RN

## 2015-03-23 NOTE — Progress Notes (Signed)
°   Mar 22, 2015 0719  Clinical Encounter Type  Visited With Family;Patient not available;Health care provider  Visit Type Initial;Spiritual support;Death  Spiritual Encounters  Spiritual Needs Emotional;Grief support  Stress Factors  Patient Stress Factors None identified  Family Stress Factors Loss   Responded to page to provide support to the spouse of a patient that just passed away. Patient passed around 7:12 AM. Provided comfort, empathic listening, and support. Mr. Vilchis has family on the way to see the patient before she leaves the room. Funeral home information passed on to the nurse.   Drue Dun, Contract Chaplain Mar 22, 2015, 8:19 AM

## 2015-03-23 NOTE — Discharge Summary (Signed)
Death Summary  Meagan [name] VVK:[id number] DOB: July 16, 1946 DOA: April 08, 2015  PCP: Glo Herring., MD  Admit date: 04/08/15 Date of Death: 2015-04-20  Final Diagnoses:  Principal Problem:   Acute respiratory failure with hypoxia Active Problems:   Severe sepsis with acute organ dysfunction   Acute respiratory failure   Acute pulmonary edema   Endotracheally intubated   Pneumonia, organism unspecified   Chronic respiratory failure with hypoxia   Acute respiratory failure with hypercapnia   Pulmonary edema   Pressure ulcer   Comfort measures only status   DNR (do not resuscitate)   History of present illness:  69 y/o with end stage lung disease who presented to the hospital with elevated troponins thought to be secondary to demand ischemia vs NSTEMI. Was placed on heparin gtt which had to be discontinued due to GI bleed.  Was treated aggressively by Pulmonary, cardiology, and Family medicine team. Given no improvement in condition and patient and family wishes she was made comfort care.   Hospital Course:  Please see above   Time: 907. Patient passed away at 0712  Signed:  Velvet Bathe  [facility] 20-Apr-2015, 9:05 AM

## 2015-03-23 NOTE — Progress Notes (Signed)
Pt passed away this AM with RN and husband in room at 0712.  [place] [facility] notified.

## 2015-03-23 DEATH — deceased

## 2015-12-22 IMAGING — CR DG HIP COMPLETE 2+V*R*
3 series · 3 of 3 positions shown · non-contrast
Comparison: None.

CLINICAL DATA: Fall.  Right hip pain 1 day

EXAM:
RIGHT HIP - COMPLETE 2+ VIEW

[t pelvis ap]
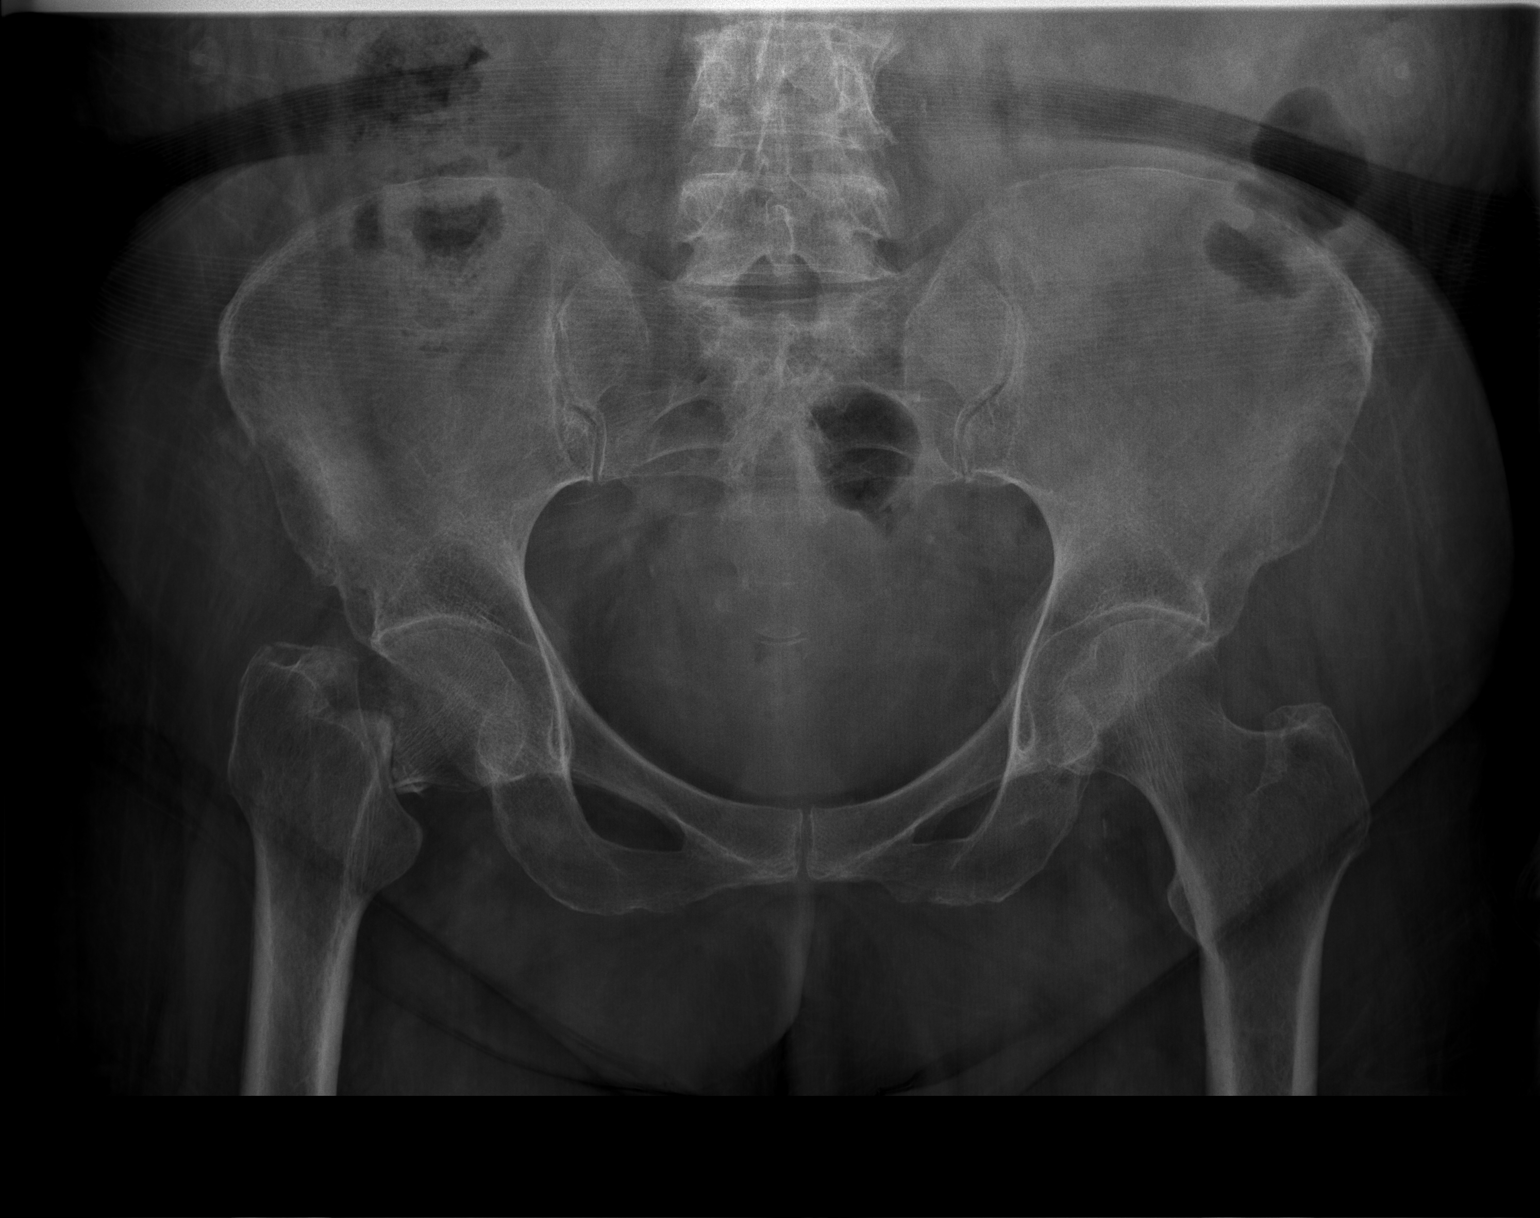

[t hip ap right]
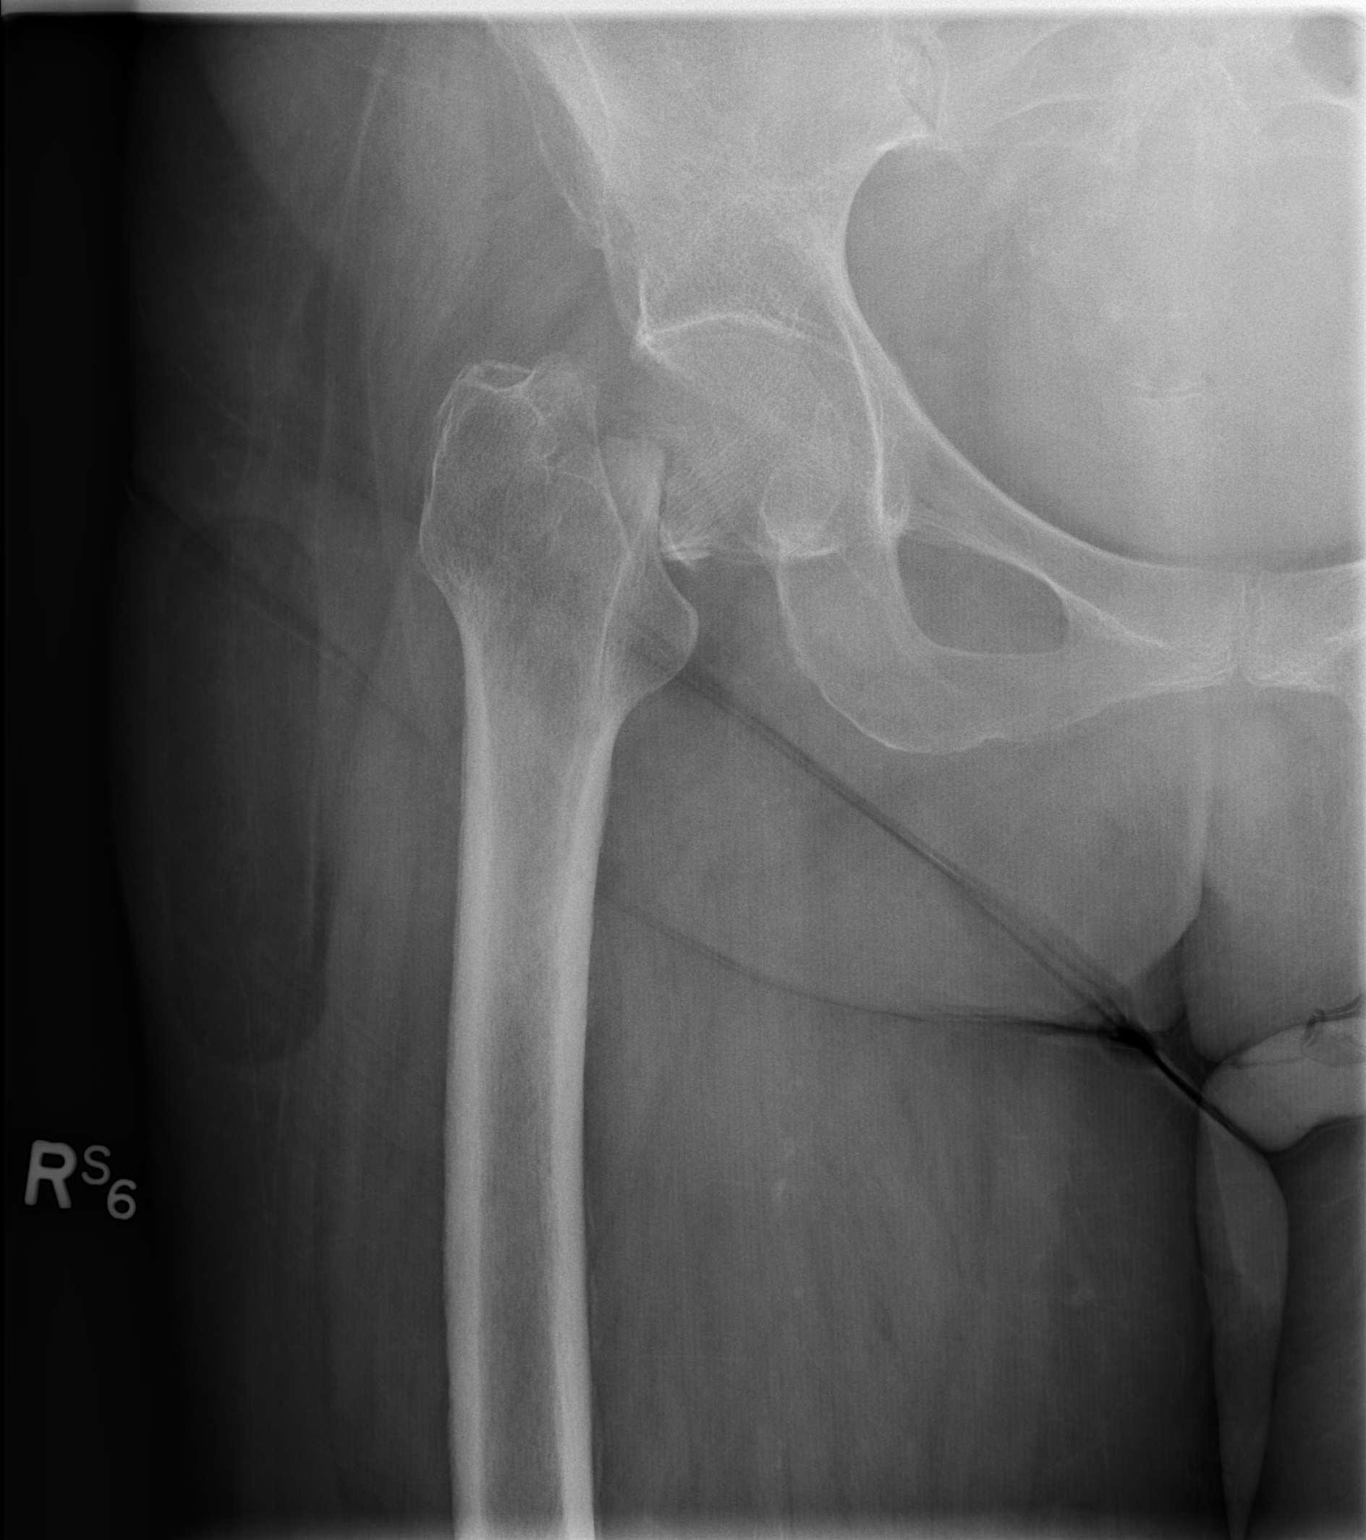

[w hip lat right]
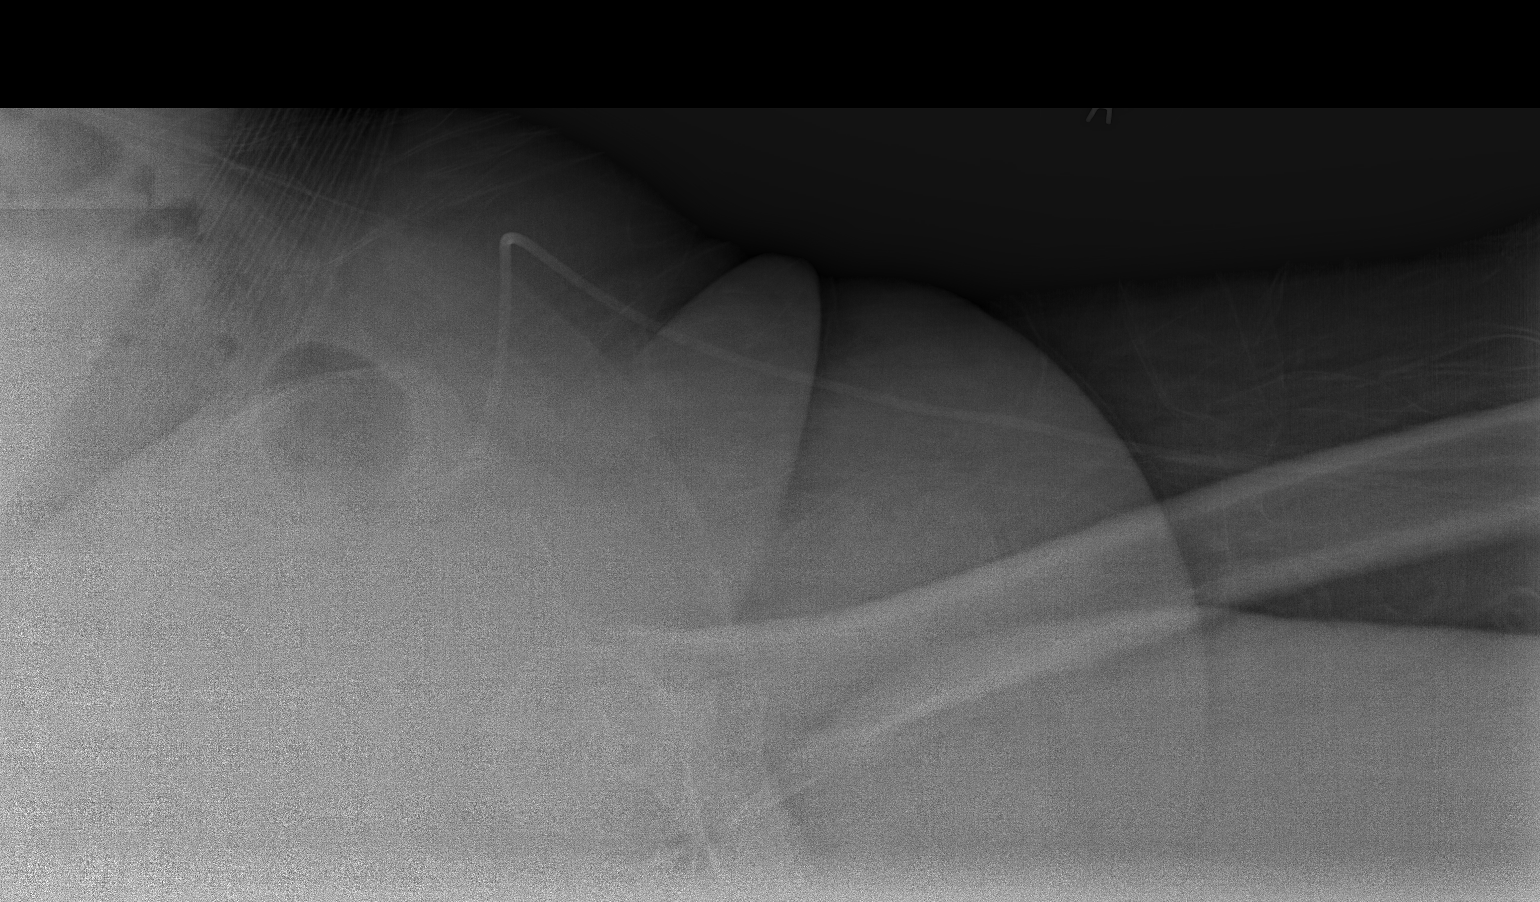

[3 of 3 positions shown; findings below may reference images not displayed]

FINDINGS: Right femoral neck fracture with mild displacement. Mild joint space
narrowing of the right hip joint. No other fracture. Left hip joint
is negative
IMPRESSION: Right femoral neck fracture with displacement.
